# Patient Record
Sex: Female | Born: 1970
Health system: Southern US, Community
[De-identification: ages and names within clinical notes are randomized; demographics above are authoritative.]

## PROBLEM LIST (undated history)

## (undated) DIAGNOSIS — I5189 Other ill-defined heart diseases: Secondary | ICD-10-CM

## (undated) DIAGNOSIS — K219 Gastro-esophageal reflux disease without esophagitis: Secondary | ICD-10-CM

## (undated) DIAGNOSIS — E063 Autoimmune thyroiditis: Secondary | ICD-10-CM

## (undated) DIAGNOSIS — Z992 Dependence on renal dialysis: Secondary | ICD-10-CM

## (undated) DIAGNOSIS — Z91199 Patient's noncompliance with other medical treatment and regimen due to unspecified reason: Secondary | ICD-10-CM

## (undated) DIAGNOSIS — L089 Local infection of the skin and subcutaneous tissue, unspecified: Secondary | ICD-10-CM

## (undated) DIAGNOSIS — I2510 Atherosclerotic heart disease of native coronary artery without angina pectoris: Secondary | ICD-10-CM

## (undated) DIAGNOSIS — D649 Anemia, unspecified: Secondary | ICD-10-CM

## (undated) DIAGNOSIS — I96 Gangrene, not elsewhere classified: Secondary | ICD-10-CM

## (undated) DIAGNOSIS — S31109A Unspecified open wound of abdominal wall, unspecified quadrant without penetration into peritoneal cavity, initial encounter: Secondary | ICD-10-CM

## (undated) DIAGNOSIS — I739 Peripheral vascular disease, unspecified: Secondary | ICD-10-CM

## (undated) DIAGNOSIS — Z9119 Patient's noncompliance with other medical treatment and regimen: Secondary | ICD-10-CM

## (undated) DIAGNOSIS — E039 Hypothyroidism, unspecified: Secondary | ICD-10-CM

## (undated) DIAGNOSIS — I10 Essential (primary) hypertension: Secondary | ICD-10-CM

## (undated) DIAGNOSIS — I4819 Other persistent atrial fibrillation: Secondary | ICD-10-CM

## (undated) DIAGNOSIS — N186 End stage renal disease: Secondary | ICD-10-CM

## (undated) HISTORY — DX: Essential (primary) hypertension: I10

## (undated) HISTORY — DX: Anemia, unspecified: D64.9

## (undated) HISTORY — DX: Autoimmune thyroiditis: E06.3

## (undated) HISTORY — DX: Morbid (severe) obesity due to excess calories: E66.01

## (undated) HISTORY — DX: Atherosclerotic heart disease of native coronary artery without angina pectoris: I25.10

## (undated) HISTORY — DX: Patient's noncompliance with other medical treatment and regimen: Z91.19

## (undated) HISTORY — DX: Gastro-esophageal reflux disease without esophagitis: K21.9

## (undated) HISTORY — DX: Patient's noncompliance with other medical treatment and regimen due to unspecified reason: Z91.199

## (undated) HISTORY — DX: End stage renal disease: N18.6

## (undated) HISTORY — PX: TONSILLECTOMY AND ADENOIDECTOMY: SUR1326

## (undated) HISTORY — PX: THYROIDECTOMY, PARTIAL: SHX18

---

## 1990-11-26 HISTORY — PX: OTHER SURGICAL HISTORY: SHX169

## 1998-03-11 ENCOUNTER — Inpatient Hospital Stay (HOSPITAL_COMMUNITY): Admission: RE | Admit: 1998-03-11 | Discharge: 1998-03-12 | Payer: Self-pay | Admitting: General Surgery

## 1998-03-26 ENCOUNTER — Other Ambulatory Visit: Admission: RE | Admit: 1998-03-26 | Discharge: 1998-03-26 | Payer: Self-pay | Admitting: Nephrology

## 1998-03-30 ENCOUNTER — Other Ambulatory Visit: Admission: RE | Admit: 1998-03-30 | Discharge: 1998-03-30 | Payer: Self-pay | Admitting: Nephrology

## 1998-05-31 ENCOUNTER — Other Ambulatory Visit: Admission: RE | Admit: 1998-05-31 | Discharge: 1998-05-31 | Payer: Self-pay | Admitting: Nephrology

## 1998-06-03 ENCOUNTER — Other Ambulatory Visit: Admission: RE | Admit: 1998-06-03 | Discharge: 1998-06-03 | Payer: Self-pay | Admitting: Nephrology

## 1998-06-10 ENCOUNTER — Other Ambulatory Visit: Admission: RE | Admit: 1998-06-10 | Discharge: 1998-06-10 | Payer: Self-pay | Admitting: Nephrology

## 1998-06-22 ENCOUNTER — Other Ambulatory Visit: Admission: RE | Admit: 1998-06-22 | Discharge: 1998-06-22 | Payer: Self-pay | Admitting: Nephrology

## 1998-07-21 ENCOUNTER — Inpatient Hospital Stay (HOSPITAL_COMMUNITY): Admission: AD | Admit: 1998-07-21 | Discharge: 1998-07-23 | Payer: Self-pay | Admitting: Nephrology

## 1998-07-22 ENCOUNTER — Encounter: Payer: Self-pay | Admitting: Nephrology

## 1998-08-19 ENCOUNTER — Ambulatory Visit (HOSPITAL_COMMUNITY): Admission: RE | Admit: 1998-08-19 | Discharge: 1998-08-19 | Payer: Self-pay | Admitting: General Surgery

## 1998-10-11 ENCOUNTER — Ambulatory Visit (HOSPITAL_COMMUNITY): Admission: RE | Admit: 1998-10-11 | Discharge: 1998-10-12 | Payer: Self-pay | Admitting: Vascular Surgery

## 1999-02-21 ENCOUNTER — Inpatient Hospital Stay (HOSPITAL_COMMUNITY): Admission: RE | Admit: 1999-02-21 | Discharge: 1999-02-27 | Payer: Self-pay

## 1999-02-23 ENCOUNTER — Encounter: Payer: Self-pay | Admitting: Nephrology

## 1999-09-13 ENCOUNTER — Ambulatory Visit (HOSPITAL_COMMUNITY): Admission: RE | Admit: 1999-09-13 | Discharge: 1999-09-13 | Payer: Self-pay | Admitting: Vascular Surgery

## 2000-01-02 ENCOUNTER — Ambulatory Visit (HOSPITAL_COMMUNITY): Admission: RE | Admit: 2000-01-02 | Discharge: 2000-01-02 | Payer: Self-pay

## 2000-01-10 ENCOUNTER — Encounter: Admission: RE | Admit: 2000-01-10 | Discharge: 2000-01-10 | Payer: Self-pay | Admitting: Nephrology

## 2000-01-10 ENCOUNTER — Encounter: Payer: Self-pay | Admitting: Nephrology

## 2000-01-16 ENCOUNTER — Ambulatory Visit (HOSPITAL_COMMUNITY): Admission: RE | Admit: 2000-01-16 | Discharge: 2000-01-16 | Payer: Self-pay | Admitting: *Deleted

## 2000-04-12 ENCOUNTER — Inpatient Hospital Stay (HOSPITAL_COMMUNITY): Admission: RE | Admit: 2000-04-12 | Discharge: 2000-04-12 | Payer: Self-pay

## 2000-06-14 ENCOUNTER — Inpatient Hospital Stay (HOSPITAL_COMMUNITY): Admission: RE | Admit: 2000-06-14 | Discharge: 2000-06-18 | Payer: Self-pay

## 2000-06-23 ENCOUNTER — Inpatient Hospital Stay (HOSPITAL_COMMUNITY): Admission: EM | Admit: 2000-06-23 | Discharge: 2000-06-25 | Payer: Self-pay | Admitting: Emergency Medicine

## 2000-06-24 ENCOUNTER — Encounter: Payer: Self-pay | Admitting: Vascular Surgery

## 2000-07-05 ENCOUNTER — Inpatient Hospital Stay (HOSPITAL_COMMUNITY): Admission: RE | Admit: 2000-07-05 | Discharge: 2000-07-09 | Payer: Self-pay | Admitting: Vascular Surgery

## 2000-07-05 ENCOUNTER — Encounter: Payer: Self-pay | Admitting: Vascular Surgery

## 2002-06-25 ENCOUNTER — Ambulatory Visit (HOSPITAL_COMMUNITY): Admission: RE | Admit: 2002-06-25 | Discharge: 2002-06-25 | Payer: Self-pay

## 2002-11-21 ENCOUNTER — Emergency Department (HOSPITAL_COMMUNITY): Admission: EM | Admit: 2002-11-21 | Discharge: 2002-11-21 | Payer: Self-pay | Admitting: Emergency Medicine

## 2002-11-21 ENCOUNTER — Encounter: Payer: Self-pay | Admitting: Emergency Medicine

## 2003-01-29 ENCOUNTER — Encounter: Payer: Self-pay | Admitting: Urology

## 2003-01-29 ENCOUNTER — Ambulatory Visit (HOSPITAL_COMMUNITY): Admission: RE | Admit: 2003-01-29 | Discharge: 2003-01-29 | Payer: Self-pay | Admitting: Urology

## 2003-05-08 ENCOUNTER — Encounter (INDEPENDENT_AMBULATORY_CARE_PROVIDER_SITE_OTHER): Payer: Self-pay | Admitting: *Deleted

## 2003-05-08 ENCOUNTER — Emergency Department (HOSPITAL_COMMUNITY): Admission: EM | Admit: 2003-05-08 | Discharge: 2003-05-08 | Payer: Self-pay | Admitting: *Deleted

## 2003-05-08 LAB — CONVERTED CEMR LAB
HCT: 36 %
Hemoglobin: 12.2 g/dL
MCV: 97.4 fL
Platelets: 170 10*3/uL
WBC: 16.3 10*3/uL

## 2003-05-09 ENCOUNTER — Encounter (INDEPENDENT_AMBULATORY_CARE_PROVIDER_SITE_OTHER): Payer: Self-pay | Admitting: *Deleted

## 2003-05-09 LAB — CONVERTED CEMR LAB
BUN: 61 mg/dL
CO2: 23 meq/L
Calcium: 8.5 mg/dL
Chloride: 103 meq/L
Creatinine, Ser: 12.1 mg/dL
Glucose, Bld: 113 mg/dL
HCT: 29.1 %
Hemoglobin: 10 g/dL
MCV: 96.6 fL
Platelets: 115 10*3/uL
Potassium: 7.9 meq/L
Sodium: 135 meq/L
WBC: 22.5 10*3/uL

## 2003-05-12 ENCOUNTER — Encounter (INDEPENDENT_AMBULATORY_CARE_PROVIDER_SITE_OTHER): Payer: Self-pay | Admitting: *Deleted

## 2003-05-12 LAB — CONVERTED CEMR LAB
BUN: 55 mg/dL
CO2: 22 meq/L
Calcium: 7.7 mg/dL
Chloride: 99 meq/L
Creatinine, Ser: 10.8 mg/dL
Glucose, Bld: 93 mg/dL
HCT: 23.8 %
Hemoglobin: 8 g/dL
MCV: 92.8 fL
Platelets: 214 10*3/uL
Potassium: 3.8 meq/L
Sodium: 131 meq/L
WBC: 2.4 10*3/uL

## 2003-05-17 ENCOUNTER — Encounter (INDEPENDENT_AMBULATORY_CARE_PROVIDER_SITE_OTHER): Payer: Self-pay | Admitting: *Deleted

## 2003-05-17 LAB — CONVERTED CEMR LAB
BUN: 65 mg/dL
Chloride: 103 meq/L
Glucose, Bld: 81 mg/dL
Potassium: 4.8 meq/L
Sodium: 134 meq/L

## 2003-05-19 ENCOUNTER — Encounter (INDEPENDENT_AMBULATORY_CARE_PROVIDER_SITE_OTHER): Payer: Self-pay | Admitting: *Deleted

## 2003-05-19 LAB — CONVERTED CEMR LAB
HCT: 33.7 %
Hemoglobin: 11.4 g/dL
MCV: 91.7 fL
Platelets: 290 10*3/uL
WBC: 8.6 10*3/uL

## 2003-05-20 ENCOUNTER — Encounter (INDEPENDENT_AMBULATORY_CARE_PROVIDER_SITE_OTHER): Payer: Self-pay | Admitting: *Deleted

## 2003-05-20 LAB — CONVERTED CEMR LAB
HCT: 31.9 %
Hemoglobin: 10.8 g/dL
MCV: 92.5 fL
Platelets: 260 10*3/uL
WBC: 8.3 10*3/uL

## 2003-05-25 ENCOUNTER — Encounter (INDEPENDENT_AMBULATORY_CARE_PROVIDER_SITE_OTHER): Payer: Self-pay | Admitting: *Deleted

## 2003-05-25 LAB — CONVERTED CEMR LAB
HCT: 30.5 %
Hemoglobin: 10.2 g/dL
MCV: 92.4 fL
Platelets: 223 10*3/uL
WBC: 8.1 10*3/uL

## 2004-03-18 DIAGNOSIS — Z888 Allergy status to other drugs, medicaments and biological substances status: Secondary | ICD-10-CM | POA: Insufficient documentation

## 2004-03-18 DIAGNOSIS — Z9119 Patient's noncompliance with other medical treatment and regimen: Secondary | ICD-10-CM | POA: Insufficient documentation

## 2004-04-20 ENCOUNTER — Ambulatory Visit (HOSPITAL_COMMUNITY): Admission: RE | Admit: 2004-04-20 | Discharge: 2004-04-20 | Payer: Self-pay | Admitting: Nephrology

## 2004-04-21 ENCOUNTER — Ambulatory Visit (HOSPITAL_COMMUNITY): Admission: RE | Admit: 2004-04-21 | Discharge: 2004-04-22 | Payer: Self-pay

## 2004-05-26 ENCOUNTER — Ambulatory Visit (HOSPITAL_COMMUNITY): Admission: RE | Admit: 2004-05-26 | Discharge: 2004-05-26 | Payer: Self-pay | Admitting: Vascular Surgery

## 2004-11-24 ENCOUNTER — Encounter: Admission: RE | Admit: 2004-11-24 | Discharge: 2004-11-24 | Payer: Self-pay | Admitting: Surgery

## 2004-12-06 ENCOUNTER — Ambulatory Visit (HOSPITAL_COMMUNITY): Admission: RE | Admit: 2004-12-06 | Discharge: 2004-12-06 | Payer: Self-pay | Admitting: Nephrology

## 2004-12-19 ENCOUNTER — Ambulatory Visit (HOSPITAL_COMMUNITY): Admission: RE | Admit: 2004-12-19 | Discharge: 2004-12-19 | Payer: Self-pay | Admitting: Nephrology

## 2005-01-19 ENCOUNTER — Ambulatory Visit (HOSPITAL_COMMUNITY): Admission: RE | Admit: 2005-01-19 | Discharge: 2005-01-19 | Payer: Self-pay | Admitting: Nephrology

## 2005-04-04 ENCOUNTER — Ambulatory Visit (HOSPITAL_COMMUNITY): Admission: RE | Admit: 2005-04-04 | Discharge: 2005-04-04 | Payer: Self-pay | Admitting: Gastroenterology

## 2005-05-11 ENCOUNTER — Ambulatory Visit: Payer: Self-pay

## 2005-06-07 ENCOUNTER — Ambulatory Visit (HOSPITAL_COMMUNITY): Admission: RE | Admit: 2005-06-07 | Discharge: 2005-06-07 | Payer: Self-pay | Admitting: Gastroenterology

## 2005-09-07 ENCOUNTER — Ambulatory Visit (HOSPITAL_COMMUNITY): Admission: RE | Admit: 2005-09-07 | Discharge: 2005-09-07 | Payer: Self-pay | Admitting: Nephrology

## 2005-10-25 ENCOUNTER — Ambulatory Visit (HOSPITAL_COMMUNITY): Admission: RE | Admit: 2005-10-25 | Discharge: 2005-10-25 | Payer: Self-pay | Admitting: Nephrology

## 2005-11-08 ENCOUNTER — Ambulatory Visit (HOSPITAL_COMMUNITY): Admission: RE | Admit: 2005-11-08 | Discharge: 2005-11-08 | Payer: Self-pay | Admitting: Nephrology

## 2006-03-27 ENCOUNTER — Ambulatory Visit (HOSPITAL_COMMUNITY): Admission: RE | Admit: 2006-03-27 | Discharge: 2006-03-27 | Payer: Self-pay | Admitting: Nephrology

## 2006-08-07 ENCOUNTER — Emergency Department (HOSPITAL_COMMUNITY): Admission: EM | Admit: 2006-08-07 | Discharge: 2006-08-08 | Payer: Self-pay | Admitting: Emergency Medicine

## 2006-09-27 ENCOUNTER — Inpatient Hospital Stay (HOSPITAL_COMMUNITY): Admission: RE | Admit: 2006-09-27 | Discharge: 2006-10-02 | Payer: Self-pay | Admitting: Surgery

## 2006-09-27 ENCOUNTER — Encounter (INDEPENDENT_AMBULATORY_CARE_PROVIDER_SITE_OTHER): Payer: Self-pay | Admitting: *Deleted

## 2007-01-01 ENCOUNTER — Ambulatory Visit (HOSPITAL_COMMUNITY): Admission: RE | Admit: 2007-01-01 | Discharge: 2007-01-01 | Payer: Self-pay | Admitting: Nephrology

## 2007-01-22 ENCOUNTER — Ambulatory Visit: Payer: Self-pay | Admitting: Orthopedic Surgery

## 2007-06-06 ENCOUNTER — Ambulatory Visit (HOSPITAL_COMMUNITY): Admission: RE | Admit: 2007-06-06 | Discharge: 2007-06-06 | Payer: Self-pay | Admitting: Nephrology

## 2007-06-07 ENCOUNTER — Ambulatory Visit (HOSPITAL_COMMUNITY): Admission: RE | Admit: 2007-06-07 | Discharge: 2007-06-07 | Payer: Self-pay | Admitting: Nephrology

## 2007-07-03 ENCOUNTER — Emergency Department (HOSPITAL_COMMUNITY): Admission: EM | Admit: 2007-07-03 | Discharge: 2007-07-03 | Payer: Self-pay | Admitting: Emergency Medicine

## 2007-08-02 ENCOUNTER — Ambulatory Visit (HOSPITAL_COMMUNITY): Admission: RE | Admit: 2007-08-02 | Discharge: 2007-08-02 | Payer: Self-pay | Admitting: Interventional Radiology

## 2007-09-12 ENCOUNTER — Ambulatory Visit (HOSPITAL_COMMUNITY): Admission: RE | Admit: 2007-09-12 | Discharge: 2007-09-12 | Payer: Self-pay | Admitting: Nephrology

## 2007-10-26 ENCOUNTER — Ambulatory Visit (HOSPITAL_COMMUNITY): Admission: RE | Admit: 2007-10-26 | Discharge: 2007-10-26 | Payer: Self-pay | Admitting: Nephrology

## 2007-11-29 ENCOUNTER — Ambulatory Visit (HOSPITAL_COMMUNITY): Admission: RE | Admit: 2007-11-29 | Discharge: 2007-11-29 | Payer: Self-pay | Admitting: Nephrology

## 2008-02-24 ENCOUNTER — Ambulatory Visit (HOSPITAL_COMMUNITY): Admission: RE | Admit: 2008-02-24 | Discharge: 2008-02-24 | Payer: Self-pay | Admitting: Nephrology

## 2008-02-25 ENCOUNTER — Ambulatory Visit (HOSPITAL_COMMUNITY): Admission: RE | Admit: 2008-02-25 | Discharge: 2008-02-25 | Payer: Self-pay | Admitting: Nephrology

## 2008-07-21 ENCOUNTER — Ambulatory Visit (HOSPITAL_COMMUNITY): Admission: RE | Admit: 2008-07-21 | Discharge: 2008-07-21 | Payer: Self-pay | Admitting: Nephrology

## 2008-07-27 ENCOUNTER — Ambulatory Visit: Payer: Self-pay | Admitting: Surgery

## 2008-07-27 ENCOUNTER — Inpatient Hospital Stay (HOSPITAL_COMMUNITY): Admission: RE | Admit: 2008-07-27 | Discharge: 2008-07-29 | Payer: Self-pay | Admitting: Surgery

## 2008-08-09 ENCOUNTER — Ambulatory Visit: Payer: Self-pay | Admitting: Surgery

## 2008-08-12 ENCOUNTER — Ambulatory Visit: Payer: Self-pay | Admitting: Surgery

## 2008-10-01 ENCOUNTER — Ambulatory Visit (HOSPITAL_COMMUNITY): Admission: RE | Admit: 2008-10-01 | Discharge: 2008-10-01 | Payer: Self-pay | Admitting: Nephrology

## 2008-10-06 DIAGNOSIS — N033 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: Secondary | ICD-10-CM | POA: Insufficient documentation

## 2008-12-01 ENCOUNTER — Ambulatory Visit (HOSPITAL_COMMUNITY): Admission: RE | Admit: 2008-12-01 | Discharge: 2008-12-01 | Payer: Self-pay | Admitting: Nephrology

## 2008-12-03 ENCOUNTER — Ambulatory Visit (HOSPITAL_COMMUNITY): Admission: RE | Admit: 2008-12-03 | Discharge: 2008-12-03 | Payer: Self-pay | Admitting: Nephrology

## 2009-02-08 ENCOUNTER — Encounter: Admission: RE | Admit: 2009-02-08 | Discharge: 2009-02-08 | Payer: Self-pay | Admitting: Orthopedic Surgery

## 2009-02-22 ENCOUNTER — Encounter (HOSPITAL_COMMUNITY): Admission: RE | Admit: 2009-02-22 | Discharge: 2009-03-23 | Payer: Self-pay | Admitting: Orthopedic Surgery

## 2009-03-28 ENCOUNTER — Encounter (HOSPITAL_COMMUNITY): Admission: RE | Admit: 2009-03-28 | Discharge: 2009-04-27 | Payer: Self-pay | Admitting: Orthopedic Surgery

## 2009-04-24 ENCOUNTER — Emergency Department (HOSPITAL_COMMUNITY): Admission: EM | Admit: 2009-04-24 | Discharge: 2009-04-24 | Payer: Self-pay | Admitting: Emergency Medicine

## 2009-09-23 ENCOUNTER — Ambulatory Visit (HOSPITAL_COMMUNITY): Admission: RE | Admit: 2009-09-23 | Discharge: 2009-09-23 | Payer: Self-pay | Admitting: Nephrology

## 2009-09-26 DIAGNOSIS — N2581 Secondary hyperparathyroidism of renal origin: Secondary | ICD-10-CM | POA: Insufficient documentation

## 2010-03-09 ENCOUNTER — Ambulatory Visit (HOSPITAL_COMMUNITY): Admission: RE | Admit: 2010-03-09 | Discharge: 2010-03-09 | Payer: Self-pay | Admitting: Nephrology

## 2010-04-28 ENCOUNTER — Ambulatory Visit (HOSPITAL_COMMUNITY): Admission: RE | Admit: 2010-04-28 | Discharge: 2010-04-28 | Payer: Self-pay | Admitting: Nephrology

## 2010-04-29 ENCOUNTER — Ambulatory Visit (HOSPITAL_COMMUNITY): Admission: RE | Admit: 2010-04-29 | Discharge: 2010-04-29 | Payer: Self-pay | Admitting: Nephrology

## 2010-04-29 ENCOUNTER — Ambulatory Visit: Payer: Self-pay | Admitting: Vascular Surgery

## 2010-04-29 ENCOUNTER — Inpatient Hospital Stay (HOSPITAL_COMMUNITY): Admission: AD | Admit: 2010-04-29 | Discharge: 2010-04-30 | Payer: Self-pay | Admitting: Nephrology

## 2010-04-30 ENCOUNTER — Encounter: Payer: Self-pay | Admitting: Cardiology

## 2010-04-30 LAB — CONVERTED CEMR LAB
BUN: 74 mg/dL
Calcium: 7.4 mg/dL
Creatinine, Ser: 15 mg/dL
Hemoglobin: 11.4 g/dL
Potassium: 4.8 meq/L
Sodium: 138 meq/L
WBC: 10 10*3/uL

## 2010-05-12 ENCOUNTER — Ambulatory Visit: Payer: Self-pay

## 2010-05-19 ENCOUNTER — Ambulatory Visit (HOSPITAL_COMMUNITY): Admission: RE | Admit: 2010-05-19 | Discharge: 2010-05-19 | Payer: Self-pay | Admitting: Vascular Surgery

## 2010-05-21 ENCOUNTER — Emergency Department (HOSPITAL_COMMUNITY): Admission: EM | Admit: 2010-05-21 | Discharge: 2010-05-22 | Payer: Self-pay | Admitting: Emergency Medicine

## 2010-05-24 ENCOUNTER — Ambulatory Visit: Payer: Self-pay | Admitting: Vascular Surgery

## 2010-06-07 ENCOUNTER — Encounter (INDEPENDENT_AMBULATORY_CARE_PROVIDER_SITE_OTHER): Payer: Self-pay | Admitting: *Deleted

## 2010-06-07 ENCOUNTER — Ambulatory Visit: Payer: Self-pay | Admitting: Vascular Surgery

## 2010-06-07 ENCOUNTER — Ambulatory Visit (HOSPITAL_COMMUNITY): Admission: RE | Admit: 2010-06-07 | Discharge: 2010-06-07 | Payer: Self-pay | Admitting: Vascular Surgery

## 2010-06-07 LAB — CONVERTED CEMR LAB
Glucose, Bld: 78 mg/dL
HCT: 42 %
Hemoglobin: 14.3 g/dL
Potassium: 4.2 meq/L
Sodium: 139 meq/L

## 2010-06-15 ENCOUNTER — Ambulatory Visit (HOSPITAL_COMMUNITY): Admission: RE | Admit: 2010-06-15 | Discharge: 2010-06-15 | Payer: Self-pay | Admitting: Nephrology

## 2010-06-30 ENCOUNTER — Ambulatory Visit: Payer: Self-pay | Admitting: Cardiology

## 2010-06-30 ENCOUNTER — Encounter (INDEPENDENT_AMBULATORY_CARE_PROVIDER_SITE_OTHER): Payer: Self-pay | Admitting: Nephrology

## 2010-06-30 ENCOUNTER — Ambulatory Visit (HOSPITAL_COMMUNITY): Admission: RE | Admit: 2010-06-30 | Discharge: 2010-06-30 | Payer: Self-pay | Admitting: Nephrology

## 2010-07-13 ENCOUNTER — Encounter (INDEPENDENT_AMBULATORY_CARE_PROVIDER_SITE_OTHER): Payer: Self-pay | Admitting: *Deleted

## 2010-07-14 ENCOUNTER — Ambulatory Visit: Payer: Self-pay | Admitting: Cardiology

## 2010-07-14 ENCOUNTER — Encounter (INDEPENDENT_AMBULATORY_CARE_PROVIDER_SITE_OTHER): Payer: Self-pay | Admitting: *Deleted

## 2010-07-14 DIAGNOSIS — E669 Obesity, unspecified: Secondary | ICD-10-CM | POA: Insufficient documentation

## 2010-07-14 DIAGNOSIS — N186 End stage renal disease: Secondary | ICD-10-CM | POA: Insufficient documentation

## 2010-07-14 DIAGNOSIS — K219 Gastro-esophageal reflux disease without esophagitis: Secondary | ICD-10-CM | POA: Insufficient documentation

## 2010-07-14 DIAGNOSIS — Z862 Personal history of diseases of the blood and blood-forming organs and certain disorders involving the immune mechanism: Secondary | ICD-10-CM | POA: Insufficient documentation

## 2010-07-14 DIAGNOSIS — Z8639 Personal history of other endocrine, nutritional and metabolic disease: Secondary | ICD-10-CM | POA: Insufficient documentation

## 2010-07-14 DIAGNOSIS — R002 Palpitations: Secondary | ICD-10-CM | POA: Insufficient documentation

## 2010-07-14 DIAGNOSIS — D638 Anemia in other chronic diseases classified elsewhere: Secondary | ICD-10-CM | POA: Insufficient documentation

## 2010-07-14 LAB — CONVERTED CEMR LAB
Free T4: 1.15 ng/dL
Free T4: 1.15 ng/dL (ref 0.80–1.80)
T3, Free: 2 pg/mL
T3, Free: 2 pg/mL — ABNORMAL LOW (ref 2.3–4.2)

## 2010-07-16 DIAGNOSIS — R55 Syncope and collapse: Secondary | ICD-10-CM | POA: Insufficient documentation

## 2010-07-16 DIAGNOSIS — R42 Dizziness and giddiness: Secondary | ICD-10-CM | POA: Insufficient documentation

## 2010-08-01 ENCOUNTER — Inpatient Hospital Stay (HOSPITAL_COMMUNITY): Admission: AD | Admit: 2010-08-01 | Discharge: 2010-08-03 | Payer: Self-pay | Admitting: Vascular Surgery

## 2010-08-02 ENCOUNTER — Ambulatory Visit: Payer: Self-pay | Admitting: Vascular Surgery

## 2010-08-04 ENCOUNTER — Telehealth (INDEPENDENT_AMBULATORY_CARE_PROVIDER_SITE_OTHER): Payer: Self-pay

## 2010-08-10 ENCOUNTER — Ambulatory Visit: Payer: Self-pay | Admitting: Cardiology

## 2010-08-15 ENCOUNTER — Ambulatory Visit: Payer: Medicare Other | Admitting: Vascular Surgery

## 2010-08-16 ENCOUNTER — Encounter (INDEPENDENT_AMBULATORY_CARE_PROVIDER_SITE_OTHER): Payer: Self-pay | Admitting: *Deleted

## 2010-08-21 ENCOUNTER — Ambulatory Visit: Payer: Self-pay | Admitting: Cardiology

## 2010-08-23 ENCOUNTER — Ambulatory Visit: Payer: Medicare Other | Admitting: Vascular Surgery

## 2010-09-07 ENCOUNTER — Encounter: Payer: Self-pay | Admitting: Cardiology

## 2010-09-22 DIAGNOSIS — I2510 Atherosclerotic heart disease of native coronary artery without angina pectoris: Secondary | ICD-10-CM | POA: Insufficient documentation

## 2010-09-26 ENCOUNTER — Encounter: Payer: Self-pay | Admitting: Cardiology

## 2010-10-12 ENCOUNTER — Ambulatory Visit: Payer: Medicare Other | Admitting: Vascular Surgery

## 2010-10-16 ENCOUNTER — Ambulatory Visit: Payer: Self-pay | Admitting: Cardiology

## 2010-11-02 ENCOUNTER — Ambulatory Visit: Payer: Medicare Other | Admitting: Vascular Surgery

## 2010-12-16 ENCOUNTER — Encounter: Payer: Self-pay | Admitting: Surgery

## 2010-12-16 ENCOUNTER — Encounter: Payer: Self-pay | Admitting: Nephrology

## 2010-12-17 ENCOUNTER — Encounter: Payer: Self-pay | Admitting: Orthopedic Surgery

## 2010-12-17 ENCOUNTER — Encounter: Payer: Self-pay | Admitting: Nephrology

## 2010-12-17 ENCOUNTER — Encounter: Payer: Self-pay | Admitting: Surgery

## 2010-12-22 ENCOUNTER — Ambulatory Visit: Payer: Medicare Other | Admitting: Vascular Surgery

## 2010-12-28 NOTE — Letter (Signed)
Summary: [facility]  [facility] RECORDS   Imported By: Nevada Crane 09/26/2010 15:29:50  _____________________________________________________________________  External Attachment:    Type:   Image     Comment:   External Document

## 2010-12-28 NOTE — Miscellaneous (Signed)
Summary: labs t4,t3,07/14/2010  Clinical Lists Changes  Observations: Added new observation of T4, FREE: 1.15 ng/dL (07/14/2010 9:26) Added new observation of T3 FREE: 2.0 pg/mL (07/14/2010 9:26)

## 2010-12-28 NOTE — Miscellaneous (Signed)
Summary: HOSPITAL LABS 06/07/2010  Clinical Lists Changes  Observations: Added new observation of BG RANDOM: 78 mg/dL (06/07/2010 16:45) Added new observation of K SERUM: 4.2 meq/L (06/07/2010 16:45) Added new observation of NA: 139 meq/L (06/07/2010 16:45) Added new observation of HCT: 42.0 % (06/07/2010 16:45) Added new observation of HGB: 14.3 g/dL (06/07/2010 16:45)

## 2010-12-28 NOTE — Progress Notes (Signed)
Summary: [facility]  Phone Note Other Incoming   Caller: Dorothy from [employer] Details for Reason: Cant reach pt.  Summary of Call: Earlie Server, customer service rep. for [employer], called to inform us that they have not been able to contact pt. concerning event monitor hook up. Earlie Server states she is calling pt's home phone @ [phone] and that number has been disconnected.  She states that if pt. is not contacted within 1 week they will have to cancel monitor order. We will try to reach pt.  Initial call taken by: Jeani Hawking Via LPN,  September  9, [id number] 1:59 PM  Follow-up for Phone Call        Pt's emergency contact, Mr. Royce Macadamia, states that he received a call from [facility] this morning concerning a procedure that pt. was suppose to have done this morning but did not keep appt. He states he went to her house to give her the message but she was not home. Mr. Royce Macadamia states he will ask Mrs. Lotito to call this office ASAP the next time he sees or talks with her.  Follow-up by: Jeani Hawking Via LPN,  September  9, 624THL 2:02 PM  Additional Follow-up for Phone Call Additional follow up Details #1::        Pt. states she has been in [place]. since Tuesday and that her home phone is broken. Pt. given phone number of [phone] opt. 1 to talk with customer service at [facility]. She repeated number back to me and states she will call them today. Additional Follow-up by: Jeani Hawking Via LPN,  September  9, [id number] 2:42 PM

## 2010-12-28 NOTE — Assessment & Plan Note (Signed)
Summary: 2 mth f/u per checkout on 08/21/10/tg   Visit Type:  Follow-up Primary Provider:  [facility]   History of Present Illness: Lori [name] is a 40 y/o obese CF we are following for assessment and treatment of AoV stenosis and CAD.  She is a dialysis patient with history near syncope. A cardionet monitor was placed on the last visit to evaluate for cardiac etiology for this.  She is without complaints and has had no futher complaints of near syncope.  Current Medications (verified): 1)  Albuterol 90 Mcg/act Aers (Albuterol) .... 2 Inh Every 4 Hours As Needed 2)  Omeprazole 20 Mg Cpdr (Omeprazole) .... Take 1 Tab Two Times A Day 3)  Tramadol-Acetaminophen 37.5-325 Mg Tabs (Tramadol-Acetaminophen) .... Take As Needed For Pain 4)  Synthroid 300 Mcg Tabs (Levothyroxine Sodium) .... Take 1 Tab At Bedtime 5)  Sulindac 200 Mg Tabs (Sulindac) .... Take 1 Tab Two Times A Day 6)  Phoslo 667 Mg Caps (Calcium Acetate (Phos Binder)) .... Take 3 Tabs With Meals Eats 2 Times Daily 1 Tab With Snack 7)  Rena-Vite  Tabs (B Complex-C-Folic Acid) .... Take 1 Tab Daily  Allergies (verified): 1)  ! Coumadin  Comments:  Nurse/Medical Assistant: patient reviewed med list from previous ov and stated that meds are correct  Past History:  Past medical, surgical, family and social histories (including risk factors) reviewed, and no changes noted (except as noted below).  Past Medical History: Reviewed history from 09/22/2010 and no changes required. 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 Resection left lobe of thyroid, subtotal parathyroidectomy with reimplantation in the forearm; small focus       of papillary carcinoma incidentally noted at pathology-2000 and Hashimoto's thyroiditis in 2001 Resection of right lobe of thyroid, neck exploration, no parathyroid tissue identified-2001 Multiple vascular access surgeries BTL-1992 Tonsillectomy and adenoidectomy  Family History: Reviewed history from 09/22/2010 and no changes required. Father-end-stage renal disease; deceased Mother-coronary artery disease; deceased in 34s Siblings: Sister with diabetes  Social History: Reviewed history from 07/13/2010 and no changes required. Married  Tobacco Use - Yes.  Alcohol Use - no Regular Exercise - no Drug Use - no  Review of Systems       All other systems have been reviewed and are negative unless stated above.   Vital Signs:  Patient profile:   40 year old female Weight:      237 pounds O2 Sat:      98 % on Room air Pulse rate:   70 / minute BP sitting:   149 / 106  (right arm)  Vitals Entered By: Doretha Sou, CNA (October 16, 2010 2:56 PM)  O2 Flow:  Room air  Physical Exam  General:  Well developed, well nourished, in no acute distress. Neck:  Scar from parathyroidectomy noted. Chest Wall:  Temporary dialysis cathether to Right chest. Lungs:  Essentially CTA Heart:  Distant, RRR with 2/6 systolic murmur at the LSB no radiation at present into the carotids. Abdomen:  Obese, NT 2+ bowel sounds. Msk:  Multiple scars on arms from previous dialysis catheters. Pulses:  pulses normal in all 4 extremities Extremities:  Right upper arm bandage from thrombectomy to remove occlussion of brachial vein to allow for dialysis fistula to be placed. Neurologic:  Alert and  oriented x 3. Psych:  Normal affect.   Impression & Recommendations:  Problem # 1:  ATHEROSCLEROTIC CARDIOVASCULAR DISEASE (ICD-429.2) She is asymptomatic from CV standpoint.  Cardionet monitor was negative for significant arrythmias.  Read by Dr. Lattie Haw, " NSR, ST, few PVS'sm and PAC's; 3 episodes of chest pain.  No arrythmia.  Problem # 2:  AORTIC STENOSIS-MILD (ICD-424.1) Will repeat echo in 2-3 years unless she becomes symptomatic re. chest pain, dizziness, of DOE.  Problem # 3:  HYPERTENSION (ICD-401.1) Elevated today, but she is due for dialysis tomorrow am.  No medication changes at present.   Will leave to nephrology for adjustments as necessary.  Patient Instructions: 1)  Your physician recommends that you schedule a follow-up appointment in: 12 months 2)  Your physician recommends that you continue on your current medications as directed. Please refer to the Current Medication list given to you today.

## 2010-12-28 NOTE — Assessment & Plan Note (Signed)
Summary: ***per Dr.Colodonado to eval EF and 2 D Echo/tg   Visit Type:  Follow-up Primary Provider:  [facility]   History of Present Illness: Lori [name] is seen in the office today for an initial visit at the kind request of Dr Burman Foster.  She has had long-standing end-stage renal disease, having required dialysis for the past 15 years or so.  She has no known coronary disease, but is at high risk.  She has done remarkably well over the years except for problems related to thyroid and parathyroid function that have required multiple surgeries and problems with vascular access.  She denies chest discomfort, but has class II dyspnea on exertion.  Her principle problem of late has been orthostatic dizziness with one episode of falling that apparently was caused by a loss of consciousness.  There is nothing to suggest a seizure disorder or other neurologic problem.  ------------------------------------ Echocardiogram 06/30/2010   Left ventricle: The cavity size was normal. Wall thickness was     increased increased in a pattern of mild to moderate LVH. Systolic     function was normal. The estimated ejection fraction was in the     range of 55% to 60%. Wall motion was normal; there were no     regional wall motion abnormalities.   - Aortic valve: Moderately calcified annulus. Trileaflet; mildly     thickened, mildly calcified leaflets. There was mild stenosis.      - Aorta: The aorta was moderately calcified.   - Mitral valve: Moderately calcified annulus. Mild regurgitation.   - Pericardium: small effusion identified with a fibrinous appearance and no hemodynamic compromise.  Current Medications (verified): 1)  Albuterol 90 Mcg/act Aers (Albuterol) .... 2 Inh Every 4 Hours As Needed 2)  Omeprazole 20 Mg Cpdr (Omeprazole) .... Take 1 Tab Two Times A Day 3)  Tramadol-Acetaminophen 37.5-325 Mg Tabs (Tramadol-Acetaminophen) .... Take As Needed For Pain 4)  Synthroid 300 Mcg Tabs  (Levothyroxine Sodium) .... Take 1 Tab At Bedtime  Allergies (verified): 1)  ! Coumadin  Past History:  Family History: Last updated: 07/14/2010 Father-end-stage renal disease; deceased Mother-coronary artery disease; deceased Siblings: Sister with diabetes  Social History: Last updated: 07/13/2010 Married  Tobacco Use - Yes.  Alcohol Use - no Regular Exercise - no Drug Use - no  Past Medical History: ESRD-hemodialysis since approximately 1995 Anemia Hyperparathyroidism Hashimoto's thyroiditis: S/P resection of left and right lobes of thyroid 2000 and '01; focus of papillary carcinoma  Gastroesophageal reflux disease: h/o esophageal stricture-previous dilatation Morbid obesity Medical noncompliance  Past Surgical History: Resection left lobe of thyroid, subtotal parathyroidectomy with reimplantation in the forearm; small focus       of papillary carcinoma incidentally noted at pathology-2000 and Hashimoto's thyroiditis in 2001 Resection of right lobe of thyroid, neck exploration, no parathyroid tissue identified-2001 Multiple vascular access surgeries BTL-[id number] Tonsillectomy and adenoidectomy  Review of Systems       The patient complains of syncope, dyspnea on exertion, and abdominal pain.  The patient denies weight loss, weight gain, vision loss, decreased hearing, hoarseness, chest pain, peripheral edema, prolonged cough, headaches, hemoptysis, and melena.    Vital Signs:  Patient profile:   40 year old female Height:      64 inches Weight:      232 pounds BMI:     39.97 Pulse rate:   90 / minute BP sitting:   101 / 63  (right arm)  Vitals Entered By: Doretha Sou, CNA (July 14, 2010  1:42 PM)   Physical Exam  General:  Obese; well-developed; no acute distress: HEENT-[place]/AT; PERRL; EOM intact; conjunctiva and lids nl:  Neck-No JVD; no carotid bruits: Endocrine-No thyromegaly; transverse surgical scar at the base of the neck Lungs-No tachypnea, clear  without rales, rhonchi or wheezes; right chest dialysis catheter CV-normal PMI; distant S1 and S2; grade 2/6 systolic ejection murmur Abdomen-BS normal; soft and non-tender without masses or organomegaly: MS-No deformities, cyanosis or clubbing: Neurologic-Nl cranial nerves; symmetric strength and tone: Skin- Warm, no sig. lesions; bronze discoloration of skin Extremities-decreased to absentl distal pulses; 1/2+ edema    Impression & Recommendations:  Problem # 1:  HYPERTENSION (ICD-401.1) Assessment of blood pressure is questionable due to vascular damage in all 4 extremities.  Pressure readings were obtained from the right leg, but the accuracy of these measurements is unknown, and their usefulness for orthostatic vital signs questionable.  We may ultimately need to establish an arterial line to accurately measure arterial pressure.  Problem # 2:  SYNCOPE (ICD-780.2) By history, patient describes symptoms of orthostatic hypotension with loss of consciousness.  As the result of multiple vascular surgeries, reliable measurements of blood pressure cannot be obtained with a sphygmomanometer.  Alternatives will be investigated; an arterial line may be necessary to obtain at least one set of valid measurements.  For now, I will discuss with the patient's nephrologist the feasibility of allowing her volume status to increase.  Echocardiogram shows mild aortic stenosis and left ventricular hypertrophy with normal LV systolic function.  Neither of these findings should predispose her to loss of consciousness or to orthostatic hypotension.  Basic laboratory studies including thyroid function will be obtained.  Problem # 3:  PALPITATIONS (ICD-785.1) The presence of palpitations in this woman with syncope raises the question of an arrhythmia.  While her history is not entirely consistent with this, we will proceed with event recording to exclude this possibility.  I will plan to reassess this nice woman  in one month.  Other Orders: Cardionet/Event Monitor (Cardionet/Event) T-T3, Free [phone]) T-T4, Free [phone])   Patient Instructions: 1)  Your physician recommends that you schedule a follow-up appointment in: 1 month 2)  Your physician recommends that you return for lab work in: today 3)  Your physician has recommended that you wear an event monitor.  Event monitors are medical devices that record the heart's electrical activity. Doctors most often use these monitors to diagnose arrhythmias. Arrhythmias are problems with the speed or rhythm of the heartbeat. The monitor is a small, portable device. You can wear one while you do your normal daily activities. This is usually used to diagnose what is causing palpitations/syncope (passing out). 4)  records from [facility]

## 2010-12-28 NOTE — Assessment & Plan Note (Signed)
Summary: 1 mth f/u per checkout on 07/14/10/tg   Visit Type:  Follow-up Primary [name]:  Lori [name]   History of Present Illness: [name] returns to the office is planned for continuing assessment and treatment of syncope and possible coronary disease.  Since her last visit currently she's been asymptomatic.  She notes no palpitations, no lightheadedness, no falls and no loss of consciousness.  She has had known orthopnea nor PND.  She has not been troubled with chest discomfort or pedal edema.  Her dry weight has not changed significantly.  Past medical records were not obtained from [facility] as planned at her last office visit.  A new request will be submitted.  Current Medications (verified): 1)  Albuterol 90 Mcg/act Aers (Albuterol) .... 2 Inh Every 4 Hours As Needed 2)  Omeprazole 20 Mg Cpdr (Omeprazole) .... Take 1 Tab Two Times A Day 3)  Tramadol-Acetaminophen 37.5-325 Mg Tabs (Tramadol-Acetaminophen) .... Take As Needed For Pain 4)  Synthroid 300 Mcg Tabs (Levothyroxine Sodium) .... Take 1 Tab At Bedtime 5)  Sulindac 200 Mg Tabs (Sulindac) .... Take 1 Tab Two Times A Day 6)  Phoslo 667 Mg Caps (Calcium Acetate (Phos Binder)) .... Take 3 Tabs With Meals Eats 2 Times Daily 1 Tab With Snack 7)  Rena-Vite  Tabs (B Complex-C-Folic Acid) .... Take 1 Tab Daily  Allergies (verified): 1)  ! Coumadin  Past History:  PMH, FH, and Social History reviewed and updated.  Review of Systems       See history of present illness.  Vital Signs:  Patient profile:   40 year old female Weight:      235 pounds BMI:     40.48 Pulse rate:   70 / minute Pulse (ortho):   78 / minute BP sitting:   125 / 79  (right arm) BP standing:   112 / 83  (right arm)  Vitals Entered By: Doretha Sou, CNA (August 21, 2010 2:57 PM)  Physical Exam  General:  Obese; well-developed; no acute distress: HEENT-[place]/AT; PERRL; EOM intact; conjunctiva and lids nl:  Neck-No JVD; no carotid  bruits: Endocrine-No thyromegaly; transverse surgical scar at the base of the neck Lungs-No tachypnea, clear without rales, rhonchi or wheezes; right chest dialysis catheter CV-normal PMI; distant S1 and S2; grade 2/6 systolic ejection murmur Abdomen-BS normal; soft and non-tender without masses or organomegaly: MS-No deformities, cyanosis or clubbing: Neurologic-Nl cranial nerves; symmetric strength and tone: Skin- Warm, no sig. lesions; bronze discoloration of skin Extremities-surgical scar in both antecubital fossa; decreased to absentl distal pulses; 1/2+ edema.  Pulses further evaluated with a hand-held Doppler.  She had a fairly good signal from the right dorsalis pedis and left radial.  The right radial, right brachial and left posterior tibial were all marginal with markedly deformed contours.  The left dorsalis pedis was absent.    Impression & Recommendations:  Problem # 1:  AORTIC STENOSIS-MILD (ICD-424.1) Mild and asymptomatic.  Problem # 2:  POSTURAL LIGHTHEADEDNESS (ICD-780.4) Standing blood pressure is normal as measured in the left arm.  It does not appear that invasive hemodynamic measurements will be necessary.  Problem # 3:  SYNCOPE (ICD-780.2) There have been no episodes of syncope during event recording nor have significant arrhythmias been identified..  We have another 9 days to possibly record EKG during symptoms before the monitor will be returned to the vendor.  Problem # 4:  HYPERTENSION (ICD-401.1) Assessment: Deteriorated Blood pressure control is excellent; current medications will be continued.  I will reassess this nice woman in 2 months.  Patient Instructions: 1)  Your physician recommends that you schedule a follow-up appointment in: 2 MONTHS 2)  CALL OUR OFFICE FOR SYNCOPE- HANDOUTS FOR NEAR SYNCOPE  Appended Document: 1 mth f/u per checkout on 07/14/10/tg    Clinical Lists Changes  Problems: Added new problem of ATHEROSCLEROTIC CARDIOVASCULAR  DISEASE (ICD-429.2) - BMS to the RCA for single-vessel disease-2004 Observations: Added new observation of FAMILY HX: Father-end-stage renal disease; deceased Mother-coronary artery disease; deceased in 13s Siblings: Sister with diabetes (09/22/2010 22:20) Added new observation of PAST MED HX: ASCVD: Unstable angina in 04/2003 requiring BMS for a 75% RCA stenosis; normal EF; 25% LAD and M1;       hematoma at cath site surgically evacuated Hypertension 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 (09/22/2010 22:20) Added new observation of PRIMARY MD: [place] [name] (09/22/2010 22:20)       Primary [name]:  Lori [name]   History of Present Illness: [facility] medical records reviewed.   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

## 2010-12-28 NOTE — Procedures (Signed)
Summary: [facility]  [employer]   Imported By: Nevada Crane 09/07/2010 14:01:18  _____________________________________________________________________  External Attachment:    Type:   Image     Comment:   External Document

## 2010-12-28 NOTE — Miscellaneous (Signed)
  Clinical Lists Changes  Observations: Added new observation of PLATELETK/UL: 223 K/uL (05/25/2003 12:39) Added new observation of MCV: 92.4 fL (05/25/2003 12:39) Added new observation of HCT: 30.5 % (05/25/2003 12:39) Added new observation of HGB: 10.2 g/dL (05/25/2003 12:39) Added new observation of WBC COUNT: 8.1 10*3/microliter (05/25/2003 12:39) Added new observation of PLATELETK/UL: 260 K/uL (05/20/2003 12:39) Added new observation of MCV: 92.5 fL (05/20/2003 12:39) Added new observation of HCT: 31.9 % (05/20/2003 12:39) Added new observation of HGB: 10.8 g/dL (05/20/2003 12:39) Added new observation of WBC COUNT: 8.3 10*3/microliter (05/20/2003 12:39) Added new observation of PLATELETK/UL: 290 K/uL (05/19/2003 12:39) Added new observation of MCV: 91.7 fL (05/19/2003 12:39) Added new observation of HCT: 33.7 % (05/19/2003 12:39) Added new observation of HGB: 11.4 g/dL (05/19/2003 12:39) Added new observation of WBC COUNT: 8.6 10*3/microliter (05/19/2003 12:39) Added new observation of BUN: 65 mg/dL (05/17/2003 12:39) Added new observation of BG RANDOM: 81 mg/dL (05/17/2003 12:39) Added new observation of CL SERUM: 103 meq/L (05/17/2003 12:39) Added new observation of K SERUM: 4.8 meq/L (05/17/2003 12:39) Added new observation of NA: 134 meq/L (05/17/2003 12:39) Added new observation of CALCIUM: 7.7 mg/dL (05/12/2003 12:39) Added new observation of CREATININE: 10.8 mg/dL (05/12/2003 12:39) Added new observation of BUN: 55 mg/dL (05/12/2003 12:39) Added new observation of BG RANDOM: 93 mg/dL (05/12/2003 12:39) Added new observation of CO2 PLSM/SER: 22 meq/L (05/12/2003 12:39) Added new observation of CL SERUM: 99 meq/L (05/12/2003 12:39) Added new observation of K SERUM: 3.8 meq/L (05/12/2003 12:39) Added new observation of NA: 131 meq/L (05/12/2003 12:39) Added new observation of PLATELETK/UL: 214 K/uL (05/12/2003 12:39) Added new observation of MCV: 92.8 fL (05/12/2003  12:39) Added new observation of HCT: 23.8 % (05/12/2003 12:39) Added new observation of HGB: 8.0 g/dL (05/12/2003 12:39) Added new observation of WBC COUNT: 2.4 10*3/microliter (05/12/2003 12:39) Added new observation of CALCIUM: 8.5 mg/dL (05/09/2003 12:39) Added new observation of CREATININE: 12.1 mg/dL (05/09/2003 12:39) Added new observation of BUN: 61 mg/dL (05/09/2003 12:39) Added new observation of BG RANDOM: 113 mg/dL (05/09/2003 12:39) Added new observation of CO2 PLSM/SER: 23 meq/L (05/09/2003 12:39) Added new observation of CL SERUM: 103 meq/L (05/09/2003 12:39) Added new observation of K SERUM: 7.9 meq/L (05/09/2003 12:39) Added new observation of NA: 135 meq/L (05/09/2003 12:39) Added new observation of PLATELETK/UL: 115 K/uL (05/09/2003 12:39) Added new observation of MCV: 96.6 fL (05/09/2003 12:39) Added new observation of HCT: 29.1 % (05/09/2003 12:39) Added new observation of HGB: 10.0 g/dL (05/09/2003 12:39) Added new observation of WBC COUNT: 22.5 10*3/microliter (05/09/2003 12:39) Added new observation of PLATELETK/UL: 170 K/uL (05/08/2003 12:39) Added new observation of MCV: 97.4 fL (05/08/2003 12:39) Added new observation of HCT: 36.0 % (05/08/2003 12:39) Added new observation of HGB: 12.2 g/dL (05/08/2003 12:39) Added new observation of WBC COUNT: 16.3 10*3/microliter (05/08/2003 12:39)

## 2011-01-16 ENCOUNTER — Ambulatory Visit: Payer: Medicare Other | Admitting: Vascular Surgery

## 2011-01-31 ENCOUNTER — Ambulatory Visit: Payer: Medicare Other | Admitting: Vascular Surgery

## 2011-02-07 ENCOUNTER — Ambulatory Visit: Payer: Medicare Other | Admitting: Vascular Surgery

## 2011-02-08 LAB — COMPREHENSIVE METABOLIC PANEL
ALT: <8 U/L (ref 0–35)
AST: 13 U/L (ref 0–37)
Albumin: 3.3 g/dL — ABNORMAL LOW (ref 3.5–5.2)
Alkaline Phosphatase: 53 U/L (ref 39–117)
BUN: 39 mg/dL — ABNORMAL HIGH (ref 6–23)
CO2: 26 mEq/L (ref 19–32)
Calcium: 8.2 mg/dL — ABNORMAL LOW (ref 8.4–10.5)
Chloride: 100 mEq/L (ref 96–112)
Creatinine, Ser: 9.56 mg/dL — ABNORMAL HIGH (ref 0.4–1.2)
GFR calc Af Amer: 5 mL/min — ABNORMAL LOW (ref >60)
GFR calc non Af Amer: 5 mL/min — ABNORMAL LOW (ref >60)
Glucose, Bld: 118 mg/dL — ABNORMAL HIGH (ref 70–99)
Potassium: 4.4 mEq/L (ref 3.5–5.1)
Sodium: 136 mEq/L (ref 135–145)
Total Bilirubin: 0.5 mg/dL (ref 0.3–1.2)
Total Protein: 6.1 g/dL (ref 6.0–8.3)

## 2011-02-08 LAB — RENAL FUNCTION PANEL
Albumin: 3.3 g/dL — ABNORMAL LOW (ref 3.5–5.2)
BUN: 73 mg/dL — ABNORMAL HIGH (ref 6–23)
CO2: 21 mEq/L (ref 19–32)
Calcium: 7.9 mg/dL — ABNORMAL LOW (ref 8.4–10.5)
Chloride: 97 mEq/L (ref 96–112)
Creatinine, Ser: 13.87 mg/dL — ABNORMAL HIGH (ref 0.4–1.2)
GFR calc Af Amer: 4 mL/min — ABNORMAL LOW (ref >60)
GFR calc non Af Amer: 3 mL/min — ABNORMAL LOW (ref >60)
Glucose, Bld: 108 mg/dL — ABNORMAL HIGH (ref 70–99)
Phosphorus: 10.2 mg/dL (ref 2.3–4.6)
Potassium: 4 mEq/L (ref 3.5–5.1)
Sodium: 136 mEq/L (ref 135–145)

## 2011-02-08 LAB — CBC
HCT: 34.3 % — ABNORMAL LOW (ref 36.0–46.0)
HCT: 35.1 % — ABNORMAL LOW (ref 36.0–46.0)
Hemoglobin: 11.7 g/dL — ABNORMAL LOW (ref 12.0–15.0)
Hemoglobin: 11.7 g/dL — ABNORMAL LOW (ref 12.0–15.0)
MCH: 31.8 pg (ref 26.0–34.0)
MCH: 32.2 pg (ref 26.0–34.0)
MCHC: 33.3 g/dL (ref 30.0–36.0)
MCHC: 34.1 g/dL (ref 30.0–36.0)
MCV: 94.5 fL (ref 78.0–100.0)
MCV: 95.4 fL (ref 78.0–100.0)
Platelets: 164 10*3/uL (ref 150–400)
Platelets: 213 10*3/uL (ref 150–400)
RBC: 3.63 MIL/uL — ABNORMAL LOW (ref 3.87–5.11)
RBC: 3.68 MIL/uL — ABNORMAL LOW (ref 3.87–5.11)
RDW: 14.3 % (ref 11.5–15.5)
RDW: 14.4 % (ref 11.5–15.5)
WBC: 7.3 10*3/uL (ref 4.0–10.5)
WBC: 7.3 10*3/uL (ref 4.0–10.5)

## 2011-02-08 LAB — GLUCOSE, CAPILLARY: Glucose-Capillary: 162 mg/dL — ABNORMAL HIGH (ref 70–99)

## 2011-02-08 LAB — POCT I-STAT 4, (NA,K, GLUC, HGB,HCT)
Glucose, Bld: 86 mg/dL (ref 70–99)
HCT: 35 % — ABNORMAL LOW (ref 36.0–46.0)
Hemoglobin: 11.9 g/dL — ABNORMAL LOW (ref 12.0–15.0)
Potassium: 4.2 mEq/L (ref 3.5–5.1)
Sodium: 135 mEq/L (ref 135–145)

## 2011-02-08 LAB — SURGICAL PCR SCREEN
MRSA, PCR: NEGATIVE
Staphylococcus aureus: NEGATIVE

## 2011-02-08 LAB — PHOSPHORUS: Phosphorus: 5.7 mg/dL — ABNORMAL HIGH (ref 2.3–4.6)

## 2011-02-11 LAB — POCT I-STAT 4, (NA,K, GLUC, HGB,HCT)
Glucose, Bld: 78 mg/dL (ref 70–99)
HCT: 42 % (ref 36.0–46.0)
Hemoglobin: 14.3 g/dL (ref 12.0–15.0)
Potassium: 4.2 mEq/L (ref 3.5–5.1)
Sodium: 139 mEq/L (ref 135–145)

## 2011-02-11 LAB — SURGICAL PCR SCREEN
MRSA, PCR: NEGATIVE
Staphylococcus aureus: NEGATIVE

## 2011-02-12 LAB — CBC
HCT: 33.2 % — ABNORMAL LOW (ref 36.0–46.0)
Hemoglobin: 11.4 g/dL — ABNORMAL LOW (ref 12.0–15.0)
MCHC: 34.3 g/dL (ref 30.0–36.0)
MCV: 96.9 fL (ref 78.0–100.0)
Platelets: 193 10*3/uL (ref 150–400)
RBC: 3.42 MIL/uL — ABNORMAL LOW (ref 3.87–5.11)
RDW: 15.3 % (ref 11.5–15.5)
WBC: 10.2 10*3/uL (ref 4.0–10.5)

## 2011-02-12 LAB — RENAL FUNCTION PANEL
Albumin: 3.2 g/dL — ABNORMAL LOW (ref 3.5–5.2)
BUN: 74 mg/dL — ABNORMAL HIGH (ref 6–23)
CO2: 25 mEq/L (ref 19–32)
Calcium: 7.4 mg/dL — ABNORMAL LOW (ref 8.4–10.5)
Chloride: 97 mEq/L (ref 96–112)
Creatinine, Ser: 15.83 mg/dL — ABNORMAL HIGH (ref 0.4–1.2)
GFR calc Af Amer: 3 mL/min — ABNORMAL LOW (ref >60)
GFR calc non Af Amer: 3 mL/min — ABNORMAL LOW (ref >60)
Glucose, Bld: 83 mg/dL (ref 70–99)
Phosphorus: 10.4 mg/dL (ref 2.3–4.6)
Potassium: 4.8 mEq/L (ref 3.5–5.1)
Sodium: 138 mEq/L (ref 135–145)

## 2011-02-12 LAB — POCT I-STAT 4, (NA,K, GLUC, HGB,HCT)
Glucose, Bld: 72 mg/dL (ref 70–99)
HCT: 38 % (ref 36.0–46.0)
Hemoglobin: 12.9 g/dL (ref 12.0–15.0)
Potassium: 4.5 mEq/L (ref 3.5–5.1)
Sodium: 132 mEq/L — ABNORMAL LOW (ref 135–145)

## 2011-02-12 LAB — PROTIME-INR
INR: 1.09 (ref 0.00–1.49)
Prothrombin Time: 14 seconds (ref 11.6–15.2)

## 2011-02-12 LAB — POTASSIUM: Potassium: 4.2 mEq/L (ref 3.5–5.1)

## 2011-02-12 LAB — APTT: aPTT: 28 seconds (ref 24–37)

## 2011-02-14 LAB — POTASSIUM: Potassium: 4.8 mEq/L (ref 3.5–5.1)

## 2011-02-16 ENCOUNTER — Ambulatory Visit: Payer: Medicare Other | Admitting: Vascular Surgery

## 2011-02-17 ENCOUNTER — Emergency Department: Payer: Medicare Other | Admitting: Emergency Medicine

## 2011-02-21 ENCOUNTER — Ambulatory Visit: Payer: Medicare Other | Admitting: Vascular Surgery

## 2011-02-22 ENCOUNTER — Ambulatory Visit: Payer: Medicare Other | Admitting: Vascular Surgery

## 2011-03-01 LAB — POTASSIUM: Potassium: 5.9 mEq/L — ABNORMAL HIGH (ref 3.5–5.1)

## 2011-03-12 LAB — POTASSIUM: Potassium: 5.5 mEq/L — ABNORMAL HIGH (ref 3.5–5.1)

## 2011-03-21 ENCOUNTER — Ambulatory Visit: Payer: Medicare Other | Admitting: Vascular Surgery

## 2011-04-10 NOTE — Assessment & Plan Note (Signed)
OFFICE VISIT   Lori [name], Lori [name]  DOB:  11-Dec-1970                                       08/09/2008  [id number]   REASON FOR VISIT:  Followup.   HISTORY:  This is a 40 year old female, end-stage renal disease, who presented  with an infected portion of her left thigh graft.  A 3 cm section of the  graft was resected, and a new interposition graft was placed.  This was  done on 07/27/08.  Since removal of her graft, she has defervesced.  She  had a small wound that is granulating in and is approximately 1.5 cm x  0.5 cm.  She will continue with wet-to-dry dressing changes.  I will  have her follow up on a p.r.n. basis.  There is no evidence of infection  at this time.   Eldridge Abrahams, MD  Electronically Signed   VWB/MEDQ  D:  08/09/2008  T:  08/10/2008  Job:  [id number]

## 2011-04-10 NOTE — Assessment & Plan Note (Signed)
OFFICE VISIT   Lori [name], Lori [name]  DOB:  [birth year]                                       05/12/2010  [id number]   REASON FOR VISIT:  Left thigh staple removal and discuss new  hemodialysis access.   Patient is a 40 year old Caucasian female with end-stage renal disease  who has dialysis Tuesday, Thursday, Saturday at [facility].  She has multiple failed hemodialysis accesses.  She has had  bilateral upper extremity arm grafts and most recently a left thigh  graft that was initially placed in November 1999 by Dr. Scot Dock.  She  has had multiple revisions and actually had a new left thigh graft  placed in May 2005 by Dr. Donnetta Hutching.  This again was revised in September  2009 by Dr. Napoleon Form, and he replaced a 3 cm segment of  graft.  This thrombosed again in June of this year, and she was taken to  the operating room by Dr. Kellie Simmering who attempted a thrombectomy, but the  graft was too degenerated and not felt as salvageable.  He ultimately  placed a right IJ palindrome catheter as her left internal jugular vein  was occluded.  Today she is here for staple removal of her left thigh  and left groin incision.  She has had no fevers or drainage.  She has  had some left intermittent sharp left thigh pains which sound more  related to nerve pain.  She is still requiring pain medication but says  the oxycodone is causing her to itch but says she has taken Tylox in the  past without difficulty.  In regards to a new access, she is reluctant  to consider a right thigh graft.  Apparently she had an extensive  hematoma in the right groin which ultimately required debridement  because of large nonviable skin.  This was done by Dr. Donnetta Hutching in August  2001.  She says that her right arm grafts were removed secondary to  infection and that she was told that in the future her right arm could  be considered for future hemodialysis access.   Her past  medical history is significant for hypertension, asthma,  tonsillectomy, tubal ligation, cesarean section, thyroidectomy and  hemodialysis access.   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   Her family history is significant for a heart attack in her mother and  end-stage renal disease with dialysis in her father.   Review of systems is positive for height of 5 feet 4 inches, weight of  103.8 kg.  She reports pain in her legs with walking, history of heart  murmur, reflux, trouble swallowing, dizziness, asthma, anemia,  arthritis, and joint pains.  She does take karate.   Physical exam shows a heart rate of 75, oxygen saturation 99% on room  air, respirations 20.  General appearance shows an obese white female,  pleasant, in no acute distress.  Lung sounds are clear.  Her heart has a  regular rate and rhythm.  She has a 2/6 systolic ejection murmur.  Abdomen is soft with normal bowel sounds.  She has 1+ to 2+ radial and  brachial pulses on her right arm.  She has bilateral anterior tibial  Doppler signals, which is stronger on the right.  Her left groin shows  incision with  staples, which were removed.  There was a small amount of  redness limited to the actual staple line.  There is no evidence of  hematoma or dehiscence.   Patient is overall doing well following thrombectomy of her left thigh  graft.  As mentioned, I did remove the staples.  With removing the  staples, there was a small amount of bleeding.  I did place a gauze  dressing over this and told her she may remove it in 24 hours, then  clean the incision daily with soap and water and cover as needed for  drainage.  The redness seemed more related to staple reaction and not  actual infection.  I did tell her to notify us if she has increasing  redness or purulent drainage or separation of the incision site.  In  regards to the hemodialysis access, she is reluctant to consider right  thigh graft.  I  spoke with Dr. Donnetta Hutching.  He did feel that we could have  interventional radiology perform a venogram of her right upper extremity  to see if she was in fact a candidate for right upper arm dialysis  access.  Our office will contact interventional radiology regarding  scheduling this in the near future.  She will then follow up with one of  our surgeons in approximately 2 weeks to discuss the results and future  dialysis access.  I did prescribe 15 tablets of Tylox 1 tablet p.o.  q.4h. p.r.n. for pain to see if this would help with her left thigh  pain.   Jacinta Shoe, PA   Rosetta Posner, M.D.  Electronically Signed   AWZ/MEDQ  D:  05/12/2010  T:  05/13/2010  Job:  [id number]   cc:   Darrold Span. Florene Glen, M.D.

## 2011-04-10 NOTE — Discharge Summary (Signed)
NAMEDONTIA, ZORTMAN                  ACCOUNT NO.:  [id number]   MEDICAL RECORD NO.:  [id number]          PATIENT TYPE:  INP   LOCATION:  [id number]                         FACILITY:  [facility]   PHYSICIAN:  Theotis Burrow IV, MDDATE OF BIRTH:  March 10, 1971   DATE OF ADMISSION:  07/27/2008  DATE OF DISCHARGE:  07/29/2008                               DISCHARGE SUMMARY   FINAL DIAGNOSES:  1. Drainage from atrioventricular graft.  2. End-stage renal disease.  3. Hypertension.  4. Hypothyroidism.  5. Coronary artery disease.   PROCEDURE PERFORMED:  July 27, 2008, resection of infected portion  of the left thigh graft with revision using a 6-mm interposition graft  by Dr. Trula Slade.   COMPLICATIONS:  None.   DISCHARGE MEDICATIONS:  She was instructed to resume her preoperative  medicines consisting of,  1. Tums E-X 750 mg 3 by mouth daily.  2. PhosLo 667 mg tablets 4 tablets with meals.  3. Synthroid 300 mcg p.o. daily.  4. Aspirin 81 mg p.o. daily.  5. Omeprazole 40 mg p.o. daily.  6. Albuterol inhaler 2 puffs daily.   She was given a prescription for Tylox 1-2 tablets p.o. q.6 h. p.r.n.  pain total number of 40 was given.  At discharge, she is also to receive  vancomycin 1 g and Fortaz 2 g IV with each hemodialysis treatment.   DISPOSITION:  She is being discharged home after hemodialysis.  She is  to continue to follow up with hemodialysis.  She will have an  appointment with Dr. Trula Slade in 1 week for his evaluation of her wound.  Office will arrange the appointment.   BRIEF IDENTIFYING STATEMENT:  For complete details, please refer the  typed history and physical.  Briefly, this is a pleasant 40 year old  woman presented with a painful left thigh AVGs.  She was evaluated by  Dr. Trula Slade who felt that she should go to the operating room and have  her [facility] without revision of the graft.  She was informed of the risks  and benefits of the procedure, and after careful consideration,  elected  to proceed with surgery.   HOSPITAL COURSE:  Preoperative workup was completed.  She was taken to  the operating room for the aforementioned I and D with graft revision.  For complete details, please refer the typed operative report.  She  tolerated the procedure and was returned to the postanesthesia care unit  extubated.  In the postanesthesia care unit, she developed hypotension.  She was then transferred to a bed in a surgical intensive care unit and  placed on sepsis  protocol.  Her hemodynamic returned to normal.  She remained in house  under observation until July 29, 2008.  She was doing well.  She  underwent hemodialysis and was subsequently discharged home.   CONDITION ON DISCHARGE:  Stable, improved.      Chad Cordial, PA      V. Leia Alf, MD  Electronically Signed    KEL/MEDQ  D:  07/29/2008  T:  07/30/2008  Job:  [id number]

## 2011-04-10 NOTE — Op Note (Signed)
Lori [name], Lori [name]                  ACCOUNT NO.:  [id number]   MEDICAL RECORD NO.:  [id number]          PATIENT TYPE:  INP   LOCATION:  2312                         FACILITY:  [facility]   PHYSICIAN:  Theotis Burrow IV, MDDATE OF BIRTH:  Jan 21, 1971   DATE OF PROCEDURE:  07/27/2008  DATE OF DISCHARGE:                               OPERATIVE REPORT   PREOPERATIVE DIAGNOSIS:  Rule out infected left thigh graft.   POSTOPERATIVE DIAGNOSIS:  Rule out infected left thigh graft.   PROCEDURE PERFORMED:  Resection of 3-cm segment of graft with revision  using an interposition 6-mm Gore-Tex graft.   ANESTHESIA:  General.   SPECIMENS:  Involved portion of the graft.  Cultures of the wound were  taken.   BLOOD LOSS:  Minimal.   PROCEDURE:  The patient was identified in the holding area and taken to  room-9.  She was placed supine on the table.  General endotracheal  anesthesia was administered.  The left leg was prepped and draped in  standard sterile fashion.  Time-out was called.  Antibiotics were given.  An incision was made with #10 blade over the area with the drainage.  A  sharp dissection was used to carry down to the graft.  Graft appeared to  be well incorporated at this time; however, there was a large defect in  the graft likely from the sheath used for interventional procedure.  Since there was a small amount of drainage coming from this area of the  wound and given the patient's high fevers, I elected to disresect this  portion of the graft and place an interposition graft, tunneled out  lateral.  The incisions were made proximal and distal to the involved  area, and the graft was exposed to these small incisions.  At this  point, the patient was given systemic heparin and prior to heparin, a  tunnel between the 2 incisions was created.  The graft was then  transected and the portion of the graft to be excluded was resected.  Again, this was densely adherent to the surrounding  tissue.  A 6-mm  graft was then sewn end-to-end with a 6-0 Prolene to each venous and  arterial side.  Prior to releasing the occlusion clamps, the graft was  flushed appropriately and back bled appropriately.  Flow was restored.  The patient was given 50 mg of protamine.  The wounds were then  irrigated.  Once hemostasis was adequate, the incisions where the graft  was anastomosed were closed with 3-0 and 4-0 Vicryl.  The opening where  the graft was resected was packed with 4 x 4's.  The patient was then  extubated and taken to recovery room in stable condition.  There were no  complications.           ______________________________  V. Leia Alf, MD  Electronically Signed     VWB/MEDQ  D:  07/27/2008  T:  07/28/2008  Job:  [id number]

## 2011-04-10 NOTE — H&P (Signed)
Lori [name], Lori [name]                  ACCOUNT NO.:  [id number]   MEDICAL RECORD NO.:  [id number]          PATIENT TYPE:  OUT   LOCATION:  XRAY                         FACILITY:  [facility]   PHYSICIAN:  James L. Deterding, M.D.DATE OF BIRTH:  14-Nov-1971   DATE OF ADMISSION:  02/24/2008  DATE OF DISCHARGE:                              HISTORY & PHYSICAL   CHIEF COMPLAINT:  Hemodialysis catheter placement for hemodialysis,  potassium was 6.7.   HISTORY OF PRESENT ILLNESS:  The patient is a 40 year old white female  with end-stage renal disease, on hemodialysis Tuesday, Thursday, and  Saturday who was at dialysis this morning at the [facility] and found to have a clotted left femoral AV graft.  She was sent  to Interventional Radiology at [facility] for a thrombectomy.  Her  preprocedure labs revealed that she had a potassium of 6.7; therefore,  was necessitated that she have a graft placed in her right side, and she  is having hemodialysis today.   PAST MEDICAL HISTORY:  1. Significant for end-stage renal disease secondary to membranous      glomerulonephritis.  2. Anemia of chronic disease.  3. Secondary hyperparathyroidism status post parathyroidectomy.  4. Hypothyroidism status post thyroidectomy, therapeutic.  5. History of hypertension.  6. Multiple access placements and failures.   ALLERGIES:  SHE HAS ALLERGIES TO COUMADIN.   MEDICATIONS:  1. Tums E-X 750 mg 3 twice a day with meals.  2. PhosLo 667 4 tablets with meals.  3. Synthroid 300 mcg daily.  4. Aspirin 81 mg daily.  5. Omeprazole 40 mg daily.  6. Albuterol metered-dose inhaler 2 puffs daily.  7. Mucinex DM.   Her hemodialysis prescription is frequency of Tuesday, Thursday, and  Saturday and the estimated dry weight of 106.5 kilograms, length is 4  hours.  Her access is a clotted left femoral AVG.  Her hemodialysis  medications include Epogen of 1000 units 3 times a week, calcitriol 0.75  mcg 3 times a  week, and heparin 3 units 3 times a week.  Her blood flow  rate is 450, dialysis flow rate 800.  She is also on levocarnitine 2000  mg 3 times a week.   REVIEW OF SYSTEMS:  Included some nasal congestion where she is on  Mucinex for and orthopnea that she states has been present for sometime.  All others systems were negative.   PHYSICAL EXAMINATION:  VITAL SIGNS:  Temperature of 97, pulse of 71,  respiratory rate of 20, blood pressure of 132/52, and weight was 113.7  kg.  GENERAL EXAM:  She is agitated from the procedure of the catheter  placement.  HEENT:  Normocephalic, atraumatic.  EOMI.  NECK:  Supple without lymphadenopathy.  CVS:  Heart rate was regular rate and rhythm.  S1, S2.  No thrill or  bruit appreciated.  LUNGS:  Bibasilar crackles.  SKIN:  Multiple ecchymoses that were not new.  EXTREMITIES:  Pulses were 2+ and equal.  She had 1+ edema upper  extremities and lower extremities.  Access is a clotted left femoral  graft.  NEURO:  She is alert and oriented x3.  Cranial nerves II through XII are  grossly intact.   LABORATORY DATA:  At present are potassium of 6.7.   ASSESSMENT/PLAN:  The patient is 39 year old white female with end-stage  renal disease, on hemodialysis Tuesday, Thursday, and Saturday with a  clotted femoral graft who is here for Intervention Radiology performed  thrombectomy with preprocedure labs of potassium of 6.7.  1. End-stage renal disease.  She is having hemodialysis today.  Her      estimated dry weight is 106.5 kilograms.  Dr. Jimmy Footman placed      femoral catheter for hemodialysis today.  This is to be taken out      after the procedure.  The patient will return tomorrow for the      thrombectomy at [facility].  2. Hyperkalemia.  On hemodialysis today, she will have 0 K bath for 1      hour and then regular potassium for 3 hours.  This should bring her      potassium down and she has to repeat potassium after hemodialysis.  3. Anemia of  chronic disease.  She is on Epogen 1000 units with      hemodialysis.  4. Secondary hyperparathyroid.  She is on PhosLo and Tums, as well as      calcitriol 0.75 mcg with hemodialysis.  5. Vascular access.  She has a clotted femoral arteriovenous graft,      left leg.  She is to return to Interventional Radiology tomorrow.           ______________________________  Joyice Faster Deterding, M.D.     JLD/MEDQ  D:  02/24/2008  T:  02/25/2008  Job:  [id number]

## 2011-04-10 NOTE — Assessment & Plan Note (Signed)
OFFICE VISIT   Lori [name], Lori [name]  DOB:  [birth year]                                       05/24/2010  [id number]   The patient returns for followup today.  She was seen on May 12, 2010  by Myra Gianotti.  At that point she was being considered for a new  dialysis access after she had recently failed thrombectomy in her thigh  graft.  She currently is dialyzing via a catheter.  She returns today to  discuss the findings of her upper extremity venogram.   PHYSICAL EXAMINATION:  Blood pressure 121/75 in the left arm, heart rate  73 and regular.  Right upper extremity:  She has a difficult to palpate  antecubital brachial pulse but has dense scar tissue in this area.  She  does have a 1+ right radial pulse.  She also has scars on the right arm  consistent with previous forearm graft.   I had a lengthy discussion with the patient today.  She previously had a  right forearm AV graft which had to be removed for infection.  This  apparently was several years ago, and she has not had any recurrent  cellulitis or problems in the right arm.  She had a central venogram  performed in radiology on June 24th which shows that her right axillary  vein and central veins are widely patent.  Since she has now failed two  thigh grafts, I believe the best option for her would be to go back to  the right upper extremity.  I did explain to her that she would be at  slight increased risk of infection also due to her slightly abnormal  pulse exam on the right side.  I also discussed with her the fact that  she may be at risk for steal as well, but this would still be her best  long-term option.  She understands and agrees to proceed.  Since I will  be out of town, I have scheduled her for a right upper arm loop graft by  Dr. Scot Dock on June 07, 2010.  The risks, benefits and possible  complications and procedure details were explained to the patient today  including bleeding,  infection, ischemic steal graft, thrombosis.  She  understands and agrees to proceed.     Jessy Oto. Fields, MD  Electronically Signed   CEF/MEDQ  D:  05/24/2010  T:  05/25/2010  Job:  [id number]   cc:   [facility]

## 2011-04-13 NOTE — H&P (Signed)
Lori [name], Lori [name]                  ACCOUNT NO.:  [id number]   MEDICAL RECORD NO.:  [id number]          PATIENT TYPE:  INP   LOCATION:  5504                         FACILITY:  [facility]   PHYSICIAN:  Earnstine Regal, MD      DATE OF BIRTH:  1971-04-30   DATE OF PROCEDURE:  DATE OF DISCHARGE:  10/02/2006                    STAT - MUST CHANGE TO CORRECT WORK TYPE   REASON FOR ADMISSION:  Persistent secondary hypoparathyroidism, endstage  renal disease,   HISTORY OF PRESENT ILLNESS:  Patient is a 40 year old white female with  longstanding endstage renal disease and longstanding secondary  hyperparathyroidism.  She has had a complex previous past surgical  history.  The patient has undergone neck exploration on two occasions by  Dr. Benard Rink in 2000.  After an extensive and lengthy workup, she  has localized abnormal parathyroid hormone secretion by selective venous  sampling to a right mediastinal vein.  The patient now comes to surgery  for a neck and chest exploration for parathyroidectomy.   HOSPITAL COURSE:  Patient is admitted on the surgical service September 27, 2006.  She was taken to the operating room by Dr. Armandina Gemma and Dr.  Marlyn Corporal, for neck exploration and resection of ectopic  parathyroid gland.  Postoperatively, the patient did well.  She was  followed in consultation by the nephrology service.  Patient had  significant fall in serum calcium levels to a low of 6.2.  She was  replenished.  She received hemodialysis during her hospitalization.  Once calcium level stabilized, the patient was prepared for discharge  home on October 02, 2006.   DISCHARGE PLANNING:  Patient was discharged home October 02, 2006 in  good condition, tolerating a renal diet, with a calcium level of 7.5.  She will be seen by nephrology at 3 times weekly hemodialysis.  She will  be seen back in my office in 2 weeks for re-evaluation.   FINAL DIAGNOSES:  Secondary hyperparathyroidism,  end-stage renal  disease.   Condition at discharge is improved.      Earnstine Regal, MD  Electronically Signed     TMG/MEDQ  D:  12/05/2006  T:  12/05/2006  Job:  [id number]   cc:   Earnstine Regal, MD

## 2011-04-13 NOTE — Discharge Summary (Signed)
Lori [name], Lori [name]                  ACCOUNT NO.:  [id number]   MEDICAL RECORD NO.:  [id number]          PATIENT TYPE:  INP   LOCATION:  [id number]                         FACILITY:  [facility]   PHYSICIAN:  Earnstine Regal, MD      DATE OF BIRTH:  12-13-1970   DATE OF ADMISSION:  09/27/2006  DATE OF DISCHARGE:  10/02/2006                               DISCHARGE SUMMARY   NO DICTATION.      Earnstine Regal, MD  Electronically Signed     TMG/MEDQ  D:  12/20/2006  T:  12/20/2006  Job:  [id number]

## 2011-04-13 NOTE — Consult Note (Signed)
[place]  Patient:    Lori [name], Lori [name]                         MRN: [id number] Proc. Date: 06/14/00 Adm. Date:  [id number] Attending:  Allyn Kenner Dictator:   Jerilynn Birkenhead, M.D.                          Consultation Report  DATE OF BIRTH:  1971/09/29  REQUESTING PHYSICIAN:  Jaci Carrel, M.D.  REASON FOR CONSULTATION:  The patient has end-stage renal disease on hemodialysis, here status post thyroidectomy.  HISTORY OF PRESENT ILLNESS:  Lori [name] is a 40 year old white woman with end-stage renal disease on hemodialysis with persistent secondary hyperparathyroidism status post subtotal parathyroidectomy and left thyroid lobectomy 2000.  The patient is here now for a neck exploration and right thyroid lobectomy.  Postoperatively the patient potassium initially was 5.2, and then went to 6.4. EKG showed mildly peaked T waves and QRS widening.  The patient was taken to emergent hemodialysis (normal schedules Tuesday, Thursday, Saturday at [place].  PAST MEDICAL HISTORY: 1. End-stage renal disease secondary to glomerulonephritis.  Dialysis since    November 1977. 2. Secondary hyperparathyroidism status post subtotal parathyroidectomy in    2000 with reimplantation in the right forearm. 3. Failed chronic ambulatory peritoneal dialysis in 2000 secondary to    peritonitis. 4. Status post multiple vascular access surgeries the current one is the left    thigh graft placed in 2000, by Dr. Scot Dock. 5. Status post left thyroid lobectomy.  MEDICATIONS: 1. Os-Cal 500 plus D 5 tablets p.o. t.i.d. with meals. 2. Synthroid 0.25 mg p.o. q.d. 3. Labetalol 200 mg p.o. b.i.d. 4. Norvasc 10 mg p.o. q.d. 5. Enalapril 10 mg 2 tablets p.o. q.h.s.  ALLERGIES:  No known drug allergies.  SOCIAL HISTORY:  The patient is a 40 year old married woman. She lives with her husband and 24-year-old daughter in [place].  She worked as a [employer] manager.   No tobacco no alcohol.  FAMILY HISTORY:  Father had hemodialysis, diabetes in sister, coronary artery disease in her mother.  REVIEW OF SYSTEMS:  Complained of neck pain.  PHYSICAL EXAMINATION:  VITAL SIGNS:  Temperature 97.1, blood pressure is 150/85, heart rate is 60, respiratory rate is 16.  GENERAL:  The patient is lethargic, she is postop.  HEENT:  Normocephalic and atraumatic.  She has a sallow skin color.  PERRL, EOMI.  Oropharynx:  Moist mucous membranes.  NECK:  No JVD, no lymphadenopathy.  CHEST:  Clear to auscultation bilaterally on anterior.  CARDIOVASCULAR:  Regular rate and rhythm. No murmur, rub or gallops.  ABDOMEN:  Obese, soft, nontender, nondistended.  No hepatosplenomegaly.  EXTREMITIES:  Trace edema.  SKIN:  No lesions.  LABORATORY DATA:  Potassium immediately pre hemodialysis was 7.  ASSESSMENT:  40 year old postop from exploratory neck surgery and right thyroid lobectomy with postoperative hyperkalemia.  PLAN: 1. End-stage renal disease emergent hemodialysis today.  Hemodialysis planned    again for tomorrow, then likely she should return to her normal Tuesday,    Thursday, Saturday scheduled. 2. Postop thyroidectomy as per primary. DD:  06/14/00 TD:  06/17/00 Job: [id number]

## 2011-04-13 NOTE — Discharge Summary (Signed)
[place]. [facility]  Patient:    Lori [name]                          MRN: [id number] Adm. Date:  [id number] Disc. Date: [id number] Attending:  Dorothea Glassman Dictator:   Shelle Iron, P.A. CC:         Dr. Brett Albino, [place], [place]. Scot Dock, M.D.   Discharge Summary  DATE OF BIRTH:  October 29, 1971.  FINAL DIAGNOSES 1. Clotted left thigh arteriovenous Gore-Tex graft. 2. End-stage renal disease. 3. Anemia. 4. Hypothyroid. 5. Hypertension.  PROCEDURES 1. June 19, 2000, thrombectomy of left thigh A-V Gore-Tex graft. 2. June 24, 2000, shuntogram.  SURGEON:  Dr. Judeth Cornfield. Scot Dock.  HISTORY:  This is a 40 year old female with end-stage renal disease who is on chronic dialysis.  Patient presented at this time with a clotted left A-V Gore-Tex graft.  Because of this, she was admitted for thrombectomy.  HOSPITAL COURSE:  On June 22, 2000, patient was admitted to [facility] and at that time, underwent thrombectomy of left thigh graft.  This was done successfully.  Postoperatively, this graft remained open.  Patient would have normally left after 23-hour observation but patient had a hemoglobin that was 6.6 and because of this, it was decided to keep her and transfuse two units of packed red cells after dialysis and then recheck her blood in the morning; subsequently, she was admitted.  The following day, June 23, 2000, the A-V Gore-Tex graft continued to have a good thrill.  The hemoglobin and hematocrit had shown satisfactory improvement.  The hemoglobin on June 23, 2000 was 10.3.  Hematocrit was 28.1.  On June 24, 2000, the hemoglobin was 9.7 and the hematocrit 26.9.  Patient was kept an extra day because Dr. Scot Dock wanted to check the shunt out by having the patient undergo a shuntogram to see if there were any strictures or stenoses in the shunt; subsequently, on June 24, 2000, patient underwent a shuntogram  which revealed no intragraft anastomotic or venous stenoses.  Subsequently, patient was then prepared for discharge.  She was to dialyze the next day and subsequently, she was kept till June 25, 2000 and at that time she underwent dialysis that morning and was then discharged in the afternoon after dialysis.  DISCHARGE MEDICATIONS 1. Normodyne 200 mg b.i.d. 2. Norvasc 10 mg q.d. 3. Enalapril 10 mg q.h.s. 4. Synthroid 2 mg q.d. 5. Calcium 500 mg p.o. with meals. 6. Nephro-Vite one q.d. 7. Patient is also given Phenergan 5/325 mg one or two p.o. q.4-6h. p.r.n.    pain.  FOLLOWUP:  She is to follow up per dialysis after discharge at this time.  DISPOSITION:  Patient is subsequently discharged home in satisfactory and stable condition with satisfactory thrill and bruit noted in the left thigh A-V graft. DD:  06/25/00 TD:  06/26/00 Job: [id number]

## 2011-04-13 NOTE — Op Note (Signed)
[place]. [facility]  Patient:    Lori [name], Lori [name]                         MRN: [id number] Proc. Date: 07/05/00 Adm. Date:  [id number] Attending:  Manuella Ghazi                           Operative Report  PREOPERATIVE DIAGNOSIS:  Hematoma right thigh with large nonviable skin eschar.  POSTOPERATIVE DIAGNOSIS:  Hematoma right thigh with large nonviable skin eschar.  PROCEDURE:  Debridement of right thigh and evacuation of large right thigh hematoma.  SURGEON:  Rosetta Posner, M.D.  ASSISTANT:  Nurse.  ANESTHESIA:  General endotracheal.  COMPLICATIONS:  None.  INDICATIONS FOR PROCEDURE:  The patient is a 40 year old white female, an end-stage renal disease patient who 2 to 3 weeks prior had a right femoral temporary dialysis catheter.  The patient suffered a very large hematoma following this.  I saw her for the first time yesterday in the office, and she had a large 5 x 10 cm full-thickness skin loss eschar over her thigh with a very large hematoma underlying this.  It was recommended that she undergo admission for debridement and evacuation of this hematoma.  I also explained that she would require a protracted period of wound closure and packing due to the large size and the large skin deficit.  DESCRIPTION OF PROCEDURE:  The patient was brought to the operating room, placed in supine position.  The area of the right groin and right thigh were prepped and draped in the usual sterile fashion.  The eschar was excised in its entirety.  The large hematoma was evacuated, and the resulting defect was copiously irrigated with saline.  The wound was packed open with saline soaked Kerlix.  A sterile dressing was applied, and the patient was taken to the recovery room in stable condition. DD:  07/05/00 TD:  07/06/00 Job: [id number]

## 2011-04-13 NOTE — Op Note (Signed)
NAME:  Lori [name], Lori [name]                            ACCOUNT NO.:  [id number]   MEDICAL RECORD NO.:  [id number]                   PATIENT TYPE:  OIB   LOCATION:  5504                                 FACILITY:  [facility]   PHYSICIAN:  Rosetta Posner, M.D.                 DATE OF BIRTH:  [birth year]   DATE OF PROCEDURE:  04/21/2004  DATE OF DISCHARGE:  04/22/2004                                 OPERATIVE REPORT   PREOPERATIVE DIAGNOSES:  1. End-stage renal disease with occluded left femoral loop, arteriovenous     Gore-Tex graft, status post attempted thrombolysis the 24 hours prior.  2. Probable embolus to the left dorsalis pedis artery.   POSTOPERATIVE DIAGNOSES:  1. End-stage renal disease with occluded left femoral loop, arteriovenous     Gore-Tex graft, status post attempted thrombolysis the 24 hours prior.  2. Probable embolus to the left dorsalis pedis artery.   PROCEDURE:  1. Attempted thrombectomy of left femoral loop arteriovenous Gore-Tex graft.  2. Placement of new left femoral loop arteriovenous Gore-Tex graft.  3. Intraoperative arteriogram.  4. Attempted tibial embolectomy.  5. Ultrasound of jugular veins.  6. Right subclavian Diatek catheter placement.   SURGEON:  Rosetta Posner, M.D.   ASSISTANT:  Leta Baptist, PA   ANESTHESIA:  LMA.   COMPLICATIONS:  None.   DISPOSITION:  To holding area, stable.   INDICATIONS FOR PROCEDURE:  The patient is a 40 year old white female who 24  hours prior, had undergone attempted thrombolysis by interventional  radiology.  She was found to have acute re-occlusion and presented with  complaints of her dorsum of her foot and her toes being cold and being  discolored on the evening prior to this.  She continues to have some  discomfort in her toes, although there were no color changes preoperatively.  She had easily palpable anterior tibial pulse above the ankle and no  dorsalis pedis pulse on the left.  She did have a 2+ dorsalis  pedis pulse on  the right.  Discussed this with Ms. Ruttan and she is taken to the operating  room for attempted thrombectomy and revision of her graft and also for  possible tibial embolectomy.   PROCEDURE IN DETAIL:  The patient was taken to the operating room and placed  in the supine position.  The left groin and left leg were prepped and draped  in the usual sterile fashion.  An incision was made over the femoral artery  through a prior scar and carried down to isolate the Gore-Tex graft to the  femoral vein and femoral artery.  The anastomosis was exposed bilaterally.  The arterial anastomosis was to the superficial femoral artery just distal  to the profunda femoris takeoff.  The graft was opened transversely near the  venous anastomosis.  The patient had large false aneurysms on the medial and  lateral loops of the  left femoral graft.  The graft was thrombectomized.  There were extensive, degenerative changes throughout the graft.  An attempt  was made to replaced with a new graft.  A new loop tunnel was created in the  thigh and a 6-mm Gore-Tex graft was brought through the tunnel.  The patient  was given 6000 units of intravenous heparin.  After adequate circulation  time, the superficial femoral artery was occluded proximal and distal to the  arterial anastomosis.  The arterial anastomosis was excised.  An  intraoperative arteriogram was obtained through the superficial femoral  artery with the proximal ends without occlusion.  This showed flow in the  posterior tibial, into the foot and peroneal artery down to the ankle.  The  anterior tibial artery had flow down to the distal calf and then there was  ghosting of dye with no cutoff.  It was felt that this was due to poor  flow into the distal anterior tibial artery and dorsalis pedis artery.  A #3  Fogarty catheter was positioned and passed down to the level of the foot and  two negative passes were obtained.  It was felt that  this was probably going  into the posterior tibial artery.  Further attempts were not undertaken and  it was felt that this exploration of popliteal or tibial vessel would be  much more risky in the long term for her overall runoff.  She did have a  viable foot preoperatively despite some symptoms.  The graft was brought  through the looped tunnel and was cut, slightly spatulated and sewn end-to-  side to the superficial femoral artery with a running 6-0 Prolene suture.  This anastomosis was tested, and the graft was re-occluded and the clamps  were removed with strong flow to the foot.   Next, the common femoral vein was occluded with Satinsky clamp and the old  venous anastomosis was taken down.  The graft was cut to the appropriate  length, spatulated an sewn end-to-side to the common femoral vein with a  running 6-0 Prolene suture.  Clamps were removed and excellent thrill was  noted.  The wounds were irrigated with saline and hemostasis with  electrocautery.  The wounds were closed with 2-0 Vicryl in the groin and 3-0  Vicryl in the two tunneling sites on the distal thigh.  The skin at the  groin was closed with a 4-0 subcuticular Vicryl stitch.  The patient was  given 50 mg of protamine to reverse the heparin.   Next, the right and left neck was imaged.  There was no visualization of the  right internal jugular vein; the left internal jugular vein was small but  visualized.  The patient had a prior parathyroid surgery as well.  The  patient's right and left neck just re-prepped and draped and the patient was  placed in Trendelenburg position and using a finder needle, the left  internal jugular vein was attempted to be cannulized which was not possibly.  For this reason, the left subclavian vein was entered easily and guide wire  would not pass centrally.  Contrast was injected into the left subclavian vein and there was complete, chronic central occlusion.  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  For this  reason, the right internal jugular vein was accessed easily and the guide  wire was passed down to the level of the right atrium.  Dilator and peel-  away  sheath were passed over this and a 32-cm Diatek catheter was positioned  down the level of the right atrium.  The catheter was brought through a  subcutaneous tunnel and the two lumen ports were attached.  The catheter was  flushed with heparinized saline and was locked with 1000 unit/cc heparin.  The catheter was secured to the skin with 3-0 nylon stitch.  The site was  closed with 4-0 subcuticular Vicryl stitch.  Sterile dressing was applied.  The patient was taken to the recovery room in stable condition.                                               Rosetta Posner, M.D.   TFE/MEDQ  D:  04/21/2004  T:  04/22/2004  Job:  [id number]   cc:   D. Arne Cleveland III, M.D.  [phone] [address] [place] [address]  [place]  [place] [address]  Fax: [phone]

## 2011-04-13 NOTE — Op Note (Signed)
Lori [name], Lori [name]                  ACCOUNT NO.:  [id number]   MEDICAL RECORD NO.:  [id number]          PATIENT TYPE:  INP   LOCATION:  5504                         FACILITY:  [facility]   PHYSICIAN:  Lori Regal, MD      DATE OF BIRTH:  Dec 27, 1970   DATE OF PROCEDURE:  09/27/2006  DATE OF DISCHARGE:                                 OPERATIVE REPORT   PREOPERATIVE DIAGNOSIS:  Persistent secondary hyperparathyroidism.   POSTOPERATIVE DIAGNOSIS:  Persistent secondary hyperparathyroidism.   PROCEDURE:  Neck exploration with resection of ectopic parathyroid gland.   SURGEON:  Armandina Gemma, M.D., FACS   ASSISTANT:  Marlyn Corporal, M.D., FACS   ANESTHESIA:  General.   ESTIMATED BLOOD LOSS:  Minimal.   PREPARATION:  Betadine.   COMPLICATIONS:  None.   INDICATIONS:  The patient is a 40 year old white female well known to my  surgical practice.  The patient had undergone two prior neck explorations.  She has secondary hyperparathyroidism.  Three parathyroid glands had been  identified previously and resected.  The thyroid gland had been completely  resected due to papillary thyroid carcinoma.  However, the patient continued  to have persistent evidence of secondary hyperparathyroidism.  Sestamibi  scan had been unsuccessful in localizing an ectopic gland.  MRI scan had  also been unsuccessful in localizing an ectopic parathyroid gland.  Selective venous sampling had been performed and localized the gland to the  right side of the neck or upper mediastinum.  The patient now comes to  surgery for neck exploration.   BODY OF REPORT:  Procedure was done in OR #7 at the [place]. [facility].  The patient was brought to the operating room and placed in  supine position on the operating room table.  Following administration of  general anesthesia, the patient was positioned and then prepped and draped  in the usual strict aseptic fashion.  After ascertaining that an adequate  level of anesthesia been obtained, the patient's previous Kocher incision  was reopened with a #15 blade.  Dissection was carried down into  subcutaneous tissues and hemostasis obtained with electrocautery.  Dissection was carried through scar tissue from previous surgery.  The layer  representing the platysma is divided.  Skin flaps were elevated cephalad and  caudad, and a Mahorner self-retaining retractor was placed for exposure.  Strap muscles were incised in the midline.  Dissection was carried down onto  the trachea.  The scar tissue and multiple Ligaclips were visible in the  previous operative field.  The right carotid artery was dissected out and  dissection carried distally to the level of the innominate artery.  Likewise, the left carotid was dissected out.  The tissue in the central  compartment was mobilized.  The sternal notch was completely and dissected  out and venous tributaries ligated with medium Ligaclips.  Dissection was  carried beneath the manubrium down to the level of the aortic arch.  An  occluded innominate vein was identified.  The innominate artery was  dissected out down to the level of the aortic arch.  Central compartment  tissue was palpated, and there is a firm, hard, nodular density present.  This tissue was then gently mobilized and resected using the electrocautery.  There was a large venous tributary feeding this tissue arising from the  right side of the anterior mediastinum.  It was ligated in continuity with 2-  0 silk ties and divided.  Tissue was then excised with electrocautery in its  entirety.  It was examined on the table.  It is submitted in three parts,  labeled central compartment contents, paratracheal mass, and left  paratracheal mass.  The largest nodule was quite firm and feels calcified.  An attempt was made to  dissection it on the back table.  A suture was used  to mark this lesion, and it was submitted for frozen section.  Dr.  Colman Cater examined this under frozen section and confirms parathyroid tissue  consistent with a hyperplastic parathyroid gland.   Neck was irrigated, and good hemostasis was achieved.  Surgicel was placed  in the operative field.  Strap muscles were reapproximated in the midline  with interrupted 3-0 Vicryl sutures.  Platysma layer was closed with  interrupted 3-0 Vicryl sutures.  Subcutaneous tissues were closed with  interrupted 3-0 Vicryl sutures.  Skin was closed with a running 4-0 Monocryl  subcuticular suture.  Wound was washed and dried, and Benzoin and Steri-  Strips were applied.  Sterile dressings were applied.  The patient was  awakened from anesthesia and brought to the recovery room in stable  condition.  The patient tolerated the procedure well.      Lori Regal, MD  Electronically Signed     TMG/MEDQ  D:  09/27/2006  T:  09/27/2006  Job:  [id number]   cc:   Nicanor Alcon, M.D.  Darrold Span. Florene Glen, M.D.

## 2011-04-13 NOTE — Consult Note (Signed)
NAMETAEGAN, RAPTIS                  ACCOUNT NO.:  [id number]   MEDICAL RECORD NO.:  [id number]          PATIENT TYPE:  INP   LOCATION:  5504                         FACILITY:  [facility]   PHYSICIAN:  Caren Griffins B. Lorrene Reid, M.D.DATE OF BIRTH:  30-Sep-1971   DATE OF CONSULTATION:  09/27/2006  DATE OF DISCHARGE:                                   CONSULTATION   HISTORY OF PRESENT ILLNESS:  Lori [name] is a 40 year old white female who  dialyzes Tuesday, Thursday, Saturday at the [facility].  She  has end-stage renal disease secondary to membranosum glomerulonephritis,  biopsy-proven, and had first dialysis ten years ago to this date.  The  patient is admitted today by Dr. Harlow Asa with assistance by Dr. Arlyce Dice for  resection of a lone parathyroid gland having previously had  parathyroidectomy in 2000 with persistent secondary hyperparathyroidism  since that time.  The renal physicians are consulted for medical management  of her postoperative electrolytes, dialysis, hypothyroidism, and other  thyroid issues.  The patient is seen postoperatively.  She is drowsy but  arousable.  She has no significant complaints other than sore neck.  She  does have two residual parathyroid glands that remain in the right forearm  from her original parathyroidectomy and auto transplantation in 2002.   PAST MEDICAL HISTORY:  1. As above plus hypertension status post bilateral tubal ligation in      1992.  2. History of remote tonsillectomy and adenoidectomy.  3. History of medical nonadherence to medication and volume.  4. History of morbid obesity with weight gain of 40 kg between 1997 and      2005.  5. History of multiple vascular access problems presently with a left      femoral graft.  6. History of surgery of hypothyroidism requiring lifelong hormonal      replacement after a thyroidectomy in 2002.  Also noted that her thyroid      gland at that time had a microscopic focus of papillary  carcinoma      within the left lobe.  Surgery was done by Dr. Leafy Kindle.  The patient had      a history of redo of bilateral neck exploration, right thyroid      lobectomy, and biopsy of multiple nodules but no parathyroid tissue      found.  She had the postop diagnosis of micropapillary carcinoma of the      right thyroid and Hashimoto's thyroiditis, and this was 2001.  TSH on      monthly labs October 24th was 400.  7. The patient has a history of esophageal stricture status post dilation      May 20006, Dr. Amedeo Plenty.  8. History of a large right thigh hematoma status post I&D August 2001.   CURRENT MEDICATIONS:  The patient's medications were reviewed with her, and  her pharmacy was also contacted last Monday while the physician assistant  was rounding at the dialysis center.  It was found at that time that she had  her Sensipar 90 mg refilled October 25th.  She had Prilosec  20 mg b.i.d.  filled October 6th.  Her last Synthroid dose 200 mcg per day was refilled  with #60 in July.  She has not refilled any binders or vitamins.  Her Plavix  was last filled October 2006.  She did have eardrops filled by another  physician last August.  The patient is not taking her medicines according to  her med list according to these records; although, she states she does.   SOCIAL HISTORY:  The patient lives in [place] with her husband and  daughter.  She is disabled.  She denies tobacco use, alcohol, or drug use.  Mother is decreased with a history of coronary artery disease.  Father was  on dialysis, and he is deceased.  Her sister has diabetes.   REVIEW OF SYSTEMS:  No fever, no chills, no sweats.  The patient is  gradually gaining weight.  Appetite is not a problem.  She has symptoms of  fatigue.  She denies headache, dizziness, vision changes, any new rashes or  lesions.  No shortness of breath.  No cough, no claudications.  The patient  denies symptoms of heat or cold intolerance.  The patient  still makes urine  and denies any urgency or dysuria.  The patient denies weakness, numbness,  depression, or anxiety symptoms or any particular joint pain.  She has no  nausea, vomiting , diarrhea, fever, or chills.   CURRENT DIALYSIS ORDERS:  The patient dialyzes Tuesday, Thursday, and  Saturday in [place].  Her estimated dry weight is 105 kilograms.  She  dialyzes for four hours with the left femoral graft.  Epogen dose has been  3000 units.  She is on Zemplar 20 mcg three times a week and has been on  tight heparin.   PHYSICAL EXAMINATION:  Temperature 97.9, pulse 59, respirations 22, blood  pressure 147/92.  O2 saturation is 100% on 4 liters.  GENERAL:  A 40 year old white female, obese, lying in bed, with some  discomfort in her neck area.  She is drowsy but rouses to answer questions.  HEENT:  Normocephalic, atraumatic.  EOMs intact.  Sclerae are clear.  Eyelids are very white.  She has puffiness in the face consistent with mixed  edema.  NECK:  Dressing in place.  HEART:  Bradycardic.  No murmurs, no rubs.  Heart rate and rhythm regular.  LUNGS:  Mild occasional expiratory wheezes.  SKIN:  Multiple scars from old graft and right arm parathyroidectomy scar,  no rashes or lesions.  ABDOMEN:  Obese.  Positive bowel sounds.  Soft, nontender, nondistended.  EXTREMITIES:  PA stockings intact.  Trace to 1+ lower extremity edema.  Left  femoral graft.  Patient evaluated on dialysis.  Blood flow rate 400.  Venous  pressure is okay.  NEUROLOGIC:  Affect flat.  Arousable upon exam.  Oriented x3.   LABORATORY DATA:  Hemoglobin 13, hematocrit 38.  Sodium 138, potassium 5.9,  chloride is 96, CO2 of 27, BUN 30, creatinine 7.8, glucose 112, calcium 8.8,  phosphorous 6.6, albumin 3.8.  These labs were drawn postop.   ASSESSMENT AND PLAN:  1. End-stage renal disease.  The patient is a Tuesday, Thursday, Saturday     dialysis patient in [place].  Her potassium preoperative was in the       3 to 4 range today.  It was checked twice.  It was found to be 5.9      postoperative.  She was taken for two hours of dialysis on a 1K  bath.      Due to her surgery which has a tendency to produce hyperkalemia, her      potassium will be checked q.6 h. for the next two days and then daily      or more often as needed.  She has a tendency to high volume gains.  We      will use a goal of 2 liters today and reevaluate tomorrow.  She is on      no heparin dialysis today because she is postoperative.  2. Secondary hyperparathyroidism.  The patient had resection of a lone      gland found near the aortic arch that was large and calcified.      Pathology has showed that it was parathyroid gland tissue.  She has a      baseline postoperative calcium of 8.8; she was then dialyzed on a 2.5      calcium bath today, supplemental PhosLo is started, and we will      continue on her Zemplar that she was on prior to admission.  She has      two residual glands in her right forearm, and we did not know if the      parathyroid gland that has  been removed has been suppressing the      glands in the right forearm, and we will monitor her calcium,      phosphorous, and potassium closely to see if she needs more aggressive      intervention.  As calcium decreases, we will dialyze her tomorrow on an      added calcium bath.  She will be dialyzed tomorrow anyhow because this      is her usual day and will get back on schedule.  If need be, we can      change her PhosLo to the be given between meals as well.  Dr. Harlow Asa      will follow the patient from a surgical standpoint.  3. Hypothyroidism.  The patient had surgical hypothyroidism several years      ago.  She has had problems with management due to poor adherence to      medications.  As previously mentioned, the pharmacy was called, and she      had not refilled any Synthroid since last July.  She adamantly told me      on Tuesday that she was taking  this medication, but with a TSH of 380      to 400 for two months in a row, I highly doubt it.  She has a classic      picture of mixed edema, and I suspect she has felt so bad for so long      she does not even realize this is due to her surgical hypothyroidism.      She has chronic fatigue.  She has a depressed affect.  Her voice is      chronically hoarse.  She has dry coarse skin and a dull facial      expression and periorbital puffiness and is bradycardic.  We will place      her on telemetry now for her bradycardia as well as the risks of      hypocalcemia.  I will check a baseline EKG to make sure she does not      have any aberrant cardiac rhythms, and we will start her on Synthroid      200 mcg daily while she  was in the hospital.  4. Hypertension.  In the past, she has been on blood pressure medication.      She has not been taking any.  We will control her blood pressure with     volume control.  This is managed fairly well.  In the dialysis center      however she has a tendency to high volume gains though I do not expect      that to happen in the hospital because she is postop, and it is      difficulty swallowing following this sort of surgery.  5. History of medical nonadherence to diet and medications.  It is unclear      what is the primary barrier to her following her diet and medication      prescription.  Ironically, she just had her Sensipar filled, per her      drug store, but she has not been taking any binders.  She obviously      does not need to take Sensipar at this time but will need to take      phosphate binders after discharge.  Hopefully, during this admission we      can address this problem and hopefully inspire her to do better after      discharge.  6. GERD.  The patient has been on Prilosec b.i.d.  Will continue.  7. Anemia.  The patient has been on low-dose Epogen 300 units IV at the      dialysis unit every dialysis.  Her baseline hemoglobin is 13  although I      would expect it to drop postoperatively; therefore, I am empirically      increasing her Epogen to 2000 units while she is in the hospital.  Her      iron stores on her monthly labs were within normal limits; therefore,      she does not need any supplemental iron.      Alric Seton, P.A.    ______________________________  Elzie Rings Lorrene Reid, M.D.    MB/MEDQ  D:  09/27/2006  T:  09/28/2006  Job:  [id number]   cc:   [facility]

## 2011-04-13 NOTE — Op Note (Signed)
[place]  Patient:    Lori [name]                          MRN: [id number] Proc. Date: 06/22/00 Adm. Date:  [id number] Attending:  Dorothea Glassman                           Operative Report  PREOPERATIVE DIAGNOSIS:  Thrombosed left thigh arteriovenous graft.  POSTOPERATIVE DIAGNOSIS:  Thrombosed left thigh arteriovenous graft.  PROCEDURE PERFORMED:  Thrombectomy of left thigh arteriovenous graft.  ANESTHESIA:  General.  INDICATIONS:  This is a 40 year old woman who had apparently undergone a recent thrombectomy revision of her graft.  Yesterday, she had a problem with bleeding when a catheter was removed from her opposite groin and I suspect that she was likely hypotensive during this episode.  She was noted to have a clotted graft today.  She was sent over for thrombectomy.  DESCRIPTION OF PROCEDURE:  The patient was taken to the operating room and received a general anesthetic.  The left groin was prepped and draped in the usual sterile fashion.  The previous incision was opened and the venous anastomosis was dissected free.  As this had been recently revised, there was no significant scar tissue.  The patient was then heparinized and then the graft was divided distal to the venous anastomosis.  Graft thrombectomy was achieved using a #4 Fogarty catheter and excellent inflow established.  The graft was flushed with heparinized saline and clamped.  Venous thrombectomy was then performed.  I clamped the vein proximal and distal to the anastomosis so I could directly inspect the anastomosis and this was widely patent without any evidence of irregularity.  The graft was then sewn back end-to-end using continuous 6-0 Prolene suture.  At the completion, there was a good thrill in the graft.  Hemostasis was obtained in the wound.  The wound was closed with a deep layer of 3-0 Vicryl, the subcutaneous layer of 3-0 Vicryl and the  skin closed with staples.  A sterile dressing was applied.  The patient tolerated the procedure well and was transferred to the recovery room in satisfactory condition.  All needle and sponge counts were correct. DD:  06/22/00 TD:  06/24/00 Job: [id number]

## 2011-04-13 NOTE — H&P (Signed)
[place]  Patient:    Lori [name], Lori [name]                         MRN: [id number] Adm. Date:  [id number] Attending:  Manuella Ghazi Dictator:   Ula Lingo CC:         [facility]   History and Physical  CHIEF COMPLAINT:  Right thigh hematoma.  HISTORY OF PRESENT ILLNESS:  This is a 40 year old white female with end-stage renal disease who had a dialysis catheter removed from the right thigh on June 17, 2000 and subsequently has developed a large hematoma.  She was seen and evaluated by Dr. Donnetta Hutching, who felt she would require admission for an incision and drainage of the hematoma with debridement and local wound care postoperatively.  She was admitted this hospitalization for the procedure.  PAST MEDICAL AND SURGICAL HISTORY: 1. Hypertension. 2. End-stage renal disease secondary to glomerulonephritis since November    1997. 3. History of hyperparathyroidism, status post parathyroidectomy x 2.  Also    has an implant right arm. 4. Total thyroidectomy for thyroid cancer. 5. History of multiple vascular access procedures.  Her current access is her    left thigh femoral AV Gore-Tex graft. 6. Status post tubal ligation.  ALLERGIES:  COUMADIN.  MEDICATIONS: 1. Iron sulfate 325 mg q.d. with meals. 2. Normodyne 200 mg b.i.d. 3. Norvasc 10 mg q.d. 4. Enalapril 10 mg q.h.s. 5. Synthroid 0.025 mg q.d. 6. Os-Cal 2500 mg t.i.d. with meals. 7. Nephro-Vite one q.d.  FAMILY HISTORY:  Remarkable for father on hemodialysis.  Sister with diabetes mellitus.  Mother with coronary artery disease.  SOCIAL HISTORY:  Negative for alcohol or tobacco use.  REVIEW OF SYMPTOMS:  Remarkable for shortness of breath, previous history of CVA and TIA, history of GERD symptoms, gastroesophageal reflux disease, history of irregular menses, and sleep difficulties.  PHYSICAL EXAMINATION:  VITAL SIGNS:  Temperature 98.3, heart rate 60,  respirations 18, blood pressure 152/91.  GENERAL APPEARANCE: This is a moderately obese female who appears chronically ill.  She is in no acute distress.  HEENT:  Pharynx is clear.  She has a well-healed neck thyroid surgical scar. She has no adenopathy, no jugular venous distention, no bruits.  CARDIAC:  Regular rate and rhythm, S1, S2 normal without murmur, gallop, or rub.  ABDOMEN:  No palpable hepatosplenomegaly, soft, nontender, normoactive bowel sounds, no masses.  NEUROLOGIC:  She is lethargic post surgery.  EXTREMITIES:  No cyanosis, clubbing, or edema.  GENITOURINARY AND RECTAL:  Deferred.  VASCULAR:  Pulses are equal and intact bilateral.  The left thigh dressing is clean, dry, and intact.  The left AV Gore-Tex graft has a palpable thrill and auditory bruit.  ASSESSMENT:  Large right thigh hematoma, status post previous dialysis access catheter.  Other diagnoses per history, including hypertension, end-stage renal disease, hyperparathyroidism, total thyroidectomy for carcinoma, multiple vascular access procedures, status post tubal ligation, history of CVA in 1996, history of TIA in 1974, gastroesophageal reflux disease, and irregular menses.  PLAN:  Incision and drainage, right thigh with local wound care by Dr. Donnetta Hutching. DD:  07/05/00 TD:  07/05/00 Job: [id number]

## 2011-04-13 NOTE — Op Note (Signed)
[facility]  Patient:    Lori [name], Lori [name]                         MRN: [id number] Proc. Date: 06/14/00 Adm. Date:  [id number] Attending:  Allyn Kenner CC:         Dr. Cleda Daub, [place], [place]             Windy Kalata, M.D.                           Operative Report  CCS#:  [id number]  PREOPERATIVE DIAGNOSES: 1. Persistent secondary hyperparathyroidism status post resection of 3    hyperplastic parathyroid glands, bilateral neck exploration and left    thyroid lobectomy. 2. History of carcinoma of the left lobe of the thyroid.  PREOPERATIVE DIAGNOSES: 1. Persistent secondary hyperparathyroidism status post resection of 3    hyperplastic parathyroid glands, bilateral neck exploration and left    thyroid lobectomy. 2. History of carcinoma of the left lobe of the thyroid. 3. Hashimoto thyroiditis of right lobe of thyroid.  OPERATION PERFORMED:  Redo bilateral neck exploration, right thyroid lobectomy and biopsy of multiple nodules, no parathyroid tissue was found.  SURGEON:  Dr. Leafy Kindle.  SURGEON:  Dr. Lennie Hummer and Dr. Aaron Edelman.  ANESTHESIA:  General endotracheal.  HISTORY OF PRESENT ILLNESS:  This 40 year old female with end-stage renal failure and secondary hyperparathyroidism was operated on by me on February 21, 1999 for secondary hyperparathyroidism. At that time, bilateral neck exploration was carried out and resection of 3 hyperplastic parathyroid glands, the left inferior, the right inferior and the right superior. The left lobe of the thyroid gland was resected also looking for intrathyroidal parathyroid gland but none was found. She was found to have carcinoma of the papillary carcinoma of the left lobe of the thyroid, however.  The patient has had persistent secondary hyperparathyroidism and ______  scan that was done earlier this year was positive for what appeared to be hyperplastic parathyroid tissue on the right  side of her neck either within or about the right lobe of the thyroid. She was therefore brought to the operating room today with radionuclear guidance having been injected with ______  agent approximately 2 hours prior to the procedure, for bilateral exploration of the neck and resection of the right lobe of the thyroid as well as the persistent hyperplastic parathyroid tissue.  DESCRIPTION OF PROCEDURE:  Under adequate general endotracheal anesthesia, the patients neck was extended on a shoulder roll. The neck was then prepared and draped in the usual fashion. She had a low collar incisional scar present and the incision was made through the previous scar and carried down through the subcutaneous tissue. There did not appear to be any significant platysma muscle present. The middle cervical fascia was exposed and superior and inferior skin flaps were developed just above the middle cervical fascia.  Next, the middle cervical fascia was divided longitudinally in the midline with Bovie electrocoagulation after inserting a [place] self retaining retractor. The strap muscles were totally intact even though they had been divided at the last operation it was impossible to find any scarring of the strap muscles whatsoever. The right sided strap muscles were divided using Bovie electrocoagulation and the surgical plane of the thyroid was entered. There was a dense layer of scar tissue surrounding the right lobe of the thyroid which was markedly enlarged and multinodular. The  scar tissue was divided using Bovie electrocoagulation and the right lobe of the thyroid was exposed at its capsule.  By blunt and sharp dissection, the lobe was freed up from the surrounding tissue. Radionuclear mapping of the neck revealed a hot spot within the thyroid lobe that was around 1000 counts per second as opposed to the other areas on the right side of the neck that were in the vicinity of the 700 counts. The  counts on the left side of the neck were 300-350.  The superior pole of the thyroid gland was freed up and the sternothyroid muscles were dissected off of it. The superior pole vessels were divided over triple hemoclip technique. Two sets of clips were used and this mobilized the superior pole of the thyroid very well. The superior pole was dissected off of the trachea and the right recurrent laryngeal nerve was identified and the dissection was kept anterior to it. The inferior pole vessels were divided over hemoclips also and a good deal of Bovie electrocoagulation was used on the specimen side. The lobe was tediously dissected off the trachea and completely excised taking care not to injure the right recurrent laryngeal nerve. The specimen was removed from the operative field and sent for pathologic study. On first glance, there was no evidence of any intrathyroidal or parathyroid tissue. The thyroid did have evidence of Hashimoto thyroiditis, however.  There were other nodular structures at the inferior pole region of the thyroid and these were dissected out and sent for frozen section analysis. Specimen #2 was quite sizable and was found to be lymphatic tissue and specimen #3 was similar.  We saw no other evidence of any parathyroid glands in this tissue. We then explored the left side of the neck. The left lobe of the thyroid was surgically absent and there was a good deal of scar tissue on the left side. We exposed the esophagus and the carotid sheath. We were unable to locate any other parathyroid tissue on the left side as well. The left superior gland was the gland that was missing on the first operation and we concentrated on finding one in this area but none was found. We also explored the posterior superior mediastinum and felt no lesions back there. Having thoroughly explored both sides, we made sure that hemostasis was intact. The right sided strap muscles were repaired  with interrupted horizontal mattress sutures of 4-0 Vicryl. They were then reapproximated in the midline with the same suture.  The subcutaneous tissue of the neck was then also repaired with interrupted horizontal mattress sutures of 4-0 Vicryl and the skin was approximated with a skin stapler 35-W. A sterile dressing was applied. Estimated blood loss for the procedure was approximately 150 cc. The patient tolerated the procedure well and left the operating room in satisfactory condition. DD:  06/14/00 TD:  06/18/00 Job: [id number]

## 2011-04-13 NOTE — Op Note (Signed)
. [facility]  Patient:    Lori [name], Lori [name]                         MRN: [id number] Proc. Date: 06/16/00 Adm. Date:  [id number] Attending:  Allyn Kenner                           Operative Report  PREOPERATIVE DIAGNOSIS: 1. End-stage renal failure. 2. Thrombosis left thigh arteriovenous Gore-Tex graft.  POSTOPERATIVE DIAGNOSIS: 1. End-stage renal failure 2. Thrombosis left thigh arteriovenous Gore-Tex graft.  OPERATION PERFORMED:  Thrombectomy and revision, left thigh arteriovenous Gore-Tex graft.  SURGEON:  Gordy Clement, M.D.  ASSISTANT:  Lestine Box  ANESTHESIA:  General endotracheal.  ANESTHESIOLOGIST:  Dr. Linna Caprice.  DESCRIPTION OF PROCEDURE:  Patient brought to the operating room in stable condition.  Placed in supine position.  General endotracheal anesthesia induced.  Left leg prepped and draped in sterile fashion.  Oblique skin incision made through the scar in the left groin.  Dissection carried down to expose the venous limb of the graft.  This was followed down to an end-to-side anastomosis to the saphenofemoral junction.  The common femoral vein mobilized proximal to this. A partial occlusion clamp was placed across the common femoral vein and the venous anastomosis taken down.  The venotomy extended out on the common femoral vein.  The graft thrombectomized with a 5 and 6 Fogarty catheter.  Excellent inflow obtained. The graft filled with heparin saline solution.  A new segment of 6 mm Gore-Tex was then anastomosed end-to-side to the common femoral vein using running 6-0 Prolene suture.  This was then anastomosed end-to-end to the divided graft using running 6-0 Prolene suture.  Clamps were then removed.  Excellent flow present.  Adequate hemostasis obtained.  Sponge and instrument counts were correct.  Subcutaneous tissues closed in two layers, deep layer of running 2-0 Vicryl suture, superficial layer of  running 3-0 suture, staples applied to the skin. Sterile dressing was applied.  The patient tolerated the procedure well.  Transferred to recovery room in stable condition. DD:  06/16/00 TD:  06/17/00 Job: [id number]

## 2011-04-13 NOTE — Discharge Summary (Signed)
[place]  Patient:    Lori [name], Lori [name]                         MRN: [id number] Adm. Date:  [id number] Disc. Date: [id number] Attending:  Manuella Ghazi Dictator:   Claudette T. Crell, C.R.N.F.A CC:         Dr. Lorrene Reid at [facility]   Discharge Summary  DATE OF BIRTH:  1971-10-20  ADMITTING DIAGNOSIS:  Hematoma of right thigh with large nonviable skin eschar.  PAST MEDICAL HISTORY: 1. End-stage renal disease due to glomerulonephritis, November 1997. 2. Hypertension. 3. History of hyperparathyroidism status post parathyroidectomy/implant right    arm. 4. Total thyroidectomy for thyroid carcinoma. 5. Multiple vascular access procedures.  Current hemodialysis access left    thigh AV Gore-Tex graft.  DISCHARGE DIAGNOSES: 1. Right thigh hematoma status post debridement and evacuation on August 10th. 2. Hypertension/blood pressure medications have been adjusted.  ALLERGIES:  The patient is allergic to COUMADIN.  HOSPITAL COURSE AND PROCEDURES:  This is a 40 year old white female with end-stage renal disease who had a dialysis access catheter removed from the right thigh on June 17, 2000 and subsequently has developed a large hematoma. She was seen and evaluated by Dr. Donnetta Hutching who felt she would require admission for an incision and drainage of hematoma, debridement and local wound postoperatively. On July 05, 2000 she was [facility] under Dr. Donnetta Hutching and underwent an uncomplicated debridement of the right thigh and evacuation of a large hematoma of the right thigh under general anesthesia.  At the conclusion of the procedure, she was transferred in stable condition to the PACU.  Her postoperative course has been notable for asymptomatic bradycardia, and some hypotension.  Her medications have been adjusted accordingly.  While she has been in the hospital she has been  on inpatient hemodialysis.  She has progressed well from her surgery and Dr. Donnetta Hutching anticipates discharge for tomorrow August 14th with home health nursing to do daily dressing changes to her right thigh.  DISCHARGE MEDICATIONS: 1. Tylox 1-2 p.o. q.4-6h. for pain. 2. Iron sulfate 325 mg with meals. 3. Normodyne 100 mg b.i.d. 4. Enalapril 10 mg q.h.s. 5. Synthroid 0.025 q.d. 6. Os-Cal 2500 mg t.i.d. with meals. 7. Nephro-Vite q.d. 8. Keflex 500 mg t.i.d. x 7 days.  FOLLOW UP:  [facility] will provide a home health nurse for daily dressing changes to the right thigh.  Dr. Donnetta Hutching would like to see Ms. [name] in his office in two weeks.  The office will call with an exact date and time. DD:  07/08/00 TD:  07/09/00 Job: [id number]

## 2011-04-13 NOTE — Op Note (Signed)
Lori [name], Lori [name]                  ACCOUNT NO.:  [id number]   MEDICAL RECORD NO.:  [id number]          PATIENT TYPE:  AMB   LOCATION:  ENDO                         FACILITY:  [facility]   PHYSICIAN:  John C. Amedeo Plenty, M.D.    DATE OF BIRTH:  11-20-1971   DATE OF PROCEDURE:  04/04/2005  DATE OF DISCHARGE:                                 OPERATIVE REPORT   PROCEDURE:  Procto gastroduodenoscopy with esophageal dilatation.   INDICATIONS FOR PROCEDURE:  Dysphagia and dyspepsia, with esophagitis seen  on upper GI series. They were not able to entirely exclude esophageal  varices. The patient does have elevated alkaline phosphatase.   PROCEDURE:  The patient was placed in the left lateral decubitus position  and placed on the pulse monitor, with continuous low-flow oxygen delivered  by nasal cannula. She was sedated with 75 mcg IV fentanyl and 6 mg IV  Versed. The Olympus video endoscope was advanced under direct vision into  the oropharynx and esophagus.  The esophagus was straight and of normal  caliber, with squamocolumnar line imbedded in a fairly patent lower  esophageal ring at 38 cm.  There were two or three small erosions right at  the ring; there was no resistance to passage of the scope beyond it.  The  stomach was entered and a small amount of  liquid secretions suctioned from  the fundus.  Direct view of the cardia was unremarkable.  The fundus body,  antrum and pylorus all appeared normal. The duodenum was entered, and both  bulb and second portion were inspected and appeared to be within normal  limits. Savary guidewire was placed through the endoscope channel; scope  withdrawn.  A single pass with Savary dilator passed over the guidewire with  no resistance and blood seen on withdrawal.  The dilator was removed  together with the wire, and the patient returned to the recovery room in  stable condition. She tolerated the procedure well. There were no immediate  complications.   IMPRESSION:  Lower esophageal ring with mild esophagitis, status post  dilatation.   PLAN:  Continue with upper abdominal followup in the office in three weeks  to have her liver function tests rechecked, and possibly for workup      JCH/MEDQ  D:  04/04/2005  T:  04/04/2005  Job:  [id number]   cc:   Sherril Croon, M.D.  [place]  [place] [address]  Fax: [phone]

## 2011-04-13 NOTE — Discharge Summary (Signed)
[place]  Patient:    Lori [name], Lori [name]                         MRN: [id number] Adm. Date:  [id number] Disc. Date: [id number] Attending:  Manuella Ghazi CC:         Dr. Cleda Daub, [place]                          Windy Kalata, M.D.                           Discharge Summary  CCS# [id number]  FINAL DIAGNOSES:  1. Persistent secondary hyperparathyroidism, status post resection of three     hyperplastic parathyroid glands, bilateral neck exploration on two     occasions and total thyroidectomy, ____________ carcinoma of left lobe of     thyroid, micropapillary carcinoma of right lobe thyroid, Hashimotos     thyroiditis.  2. End-stage renal failure, status hemodialysis.  3. History of hypertension.  4. History of mild left ventricular dysfunction.  5. Thrombosis of left thigh arteriovenous graft--acute.  6. Hoarseness--postoperative complication.  HISTORY:  This unfortunate 40 year old female with end-stage renal failure and secondary hyperparathyroidism had undergone a previous exploration of her neck and resection of left lobe of her thyroid gland on February 20, 1999.  Only three parathyroid glands were found and removed.  The left lobe of her thyroid was removed expecting to find an intrathyroidal parathyriod gland but none was found.  She was found to have a microscopic focus of papillary carcinoma within the left lobe of the thyroid.  Despite that surgery, she continued to have an elevated parathyroid hormone running between 1000 and 2000 units.  A sestamibi scan done on January 02, 2000 suggested increased activity on the right side of her neck and particularly in the right inferior thyroid region.  PAST MEDICAL HISTORY:  End-stage renal failure secondary to chronic glomerulonephritis, status post renal biopsy in June of 1989.  Also history of hypertension, bilateral tubal ligation in 1992, previous tonsillectomy  and adenoidectomy, left forearm Gore-Tex graft in 1997, left upper arm Gore-Tex graft in 1998.  Right forearm Gore-Tex graft which became infected and required removal in 1999, right upper arm Gore-Tex graft which required removal due to exposure through the skin, failed peritoneal dialysis in 2000, a left thigh graft by Dr. Gae Gallop in 2000, status post subtotal parathyroidectomy with reimplantation of the right parathyroid tissue to the right forearma as noted above.  PHYSICAL EXAMINATION:  Physical examination revealed a low collar incisional scar which was well-healed.  The remainder of the exam was unremarkable except for the extremities where there were multiple scars of the upper and lower extremities from previous failed Gore-Tex graft procedures.  Her active access was in the left thigh at this time and there was a well-healed scar longitudinally over the right brachioradialis muscle of previous autotransplantation of parathyroid tissue.  HOSPITAL COURSE:  On the day of admission the patient received an injection of technetium  sestamibi agent approximately two hours prior to surgery.  She then underwent redo bilateral neck exploration, right thyroid lobectomy and biopsy of multiple nodules but no parathyroid tissue was found.  The right lobe of the thyroid did contain some micropapillary carcinoma and Hashimotos thyroiditis.  Her postoperative course was complicated by hoarseness and difficulty in swallowing liquids.  She also  thrombosis of her thigh arteriovenous graft. On the evening of surgery, the patient had placement of a femoral hemodialysis catheter and emergency hemodialysis.  The left thigh venous access was declotted on June 16, 2000 by Dr. Amedeo Plenty.  The patient was seen by Speech Pathology and thick fluids were recommended. The patient tolerated it well and went on to tolerate solid food.  Her skin staples were removed on June 17, 2000 and Steri-Strips applied.   She had hemodialysis on the appropriate days including July 22 and June 18, 2000.  On July 24, she was still hoarse but swallowing much better and wanted very much to go home.  She was tolerating a solid diet and she was discharged for further care as an outpatient.  The patients condition on discharge is satisfactory.  Diet was to be 80 gm protein, 2 gm sodium, 2 gm potassium, limited to five cups of fluid per day.  She was to resume hemodialysis in [place], [place].  DISCHARGE MEDICATIONS: 1. Nephrovite 1 tablet daily. 2. Calcium 500 mg 5 tablets 3 times a day with meals. 3. Synthroid 0.25 mg daily. 4. Labetalol 200 mg b.i.d. 5. Enalapril 10 mg 2 tablets h.s. 6. Tylox 1 tablet every 4 to 6 hours p.r.n. for pain.  She was to return to our office in two weeks for further follow-up. DD:  07/23/00 TD:  07/24/00 Job: [id number]

## 2011-08-08 ENCOUNTER — Ambulatory Visit: Payer: Medicare Other | Admitting: Vascular Surgery

## 2011-08-08 DIAGNOSIS — I10 Essential (primary) hypertension: Secondary | ICD-10-CM

## 2011-08-20 LAB — RENAL FUNCTION PANEL
Albumin: 3.2 — ABNORMAL LOW
BUN: 83 — ABNORMAL HIGH
CO2: 24
Calcium: 7.8 — ABNORMAL LOW
Chloride: 96
Creatinine, Ser: 12.56 — ABNORMAL HIGH
GFR calc Af Amer: 4 — ABNORMAL LOW
GFR calc non Af Amer: 3 — ABNORMAL LOW
Glucose, Bld: 82
Phosphorus: 7.8 — ABNORMAL HIGH
Potassium: 7
Sodium: 135

## 2011-08-20 LAB — CBC
HCT: 28.3 — ABNORMAL LOW
Hemoglobin: 9.5 — ABNORMAL LOW
MCHC: 33.7
MCV: 86.8
Platelets: 268
RBC: 3.26 — ABNORMAL LOW
RDW: 15
WBC: 9.6

## 2011-08-20 LAB — POTASSIUM: Potassium: 6.7

## 2011-08-29 LAB — RENAL FUNCTION PANEL
Albumin: 3.8
BUN: 42 — ABNORMAL HIGH
CO2: 25
Calcium: 9.2
Chloride: 97
Creatinine, Ser: 10.14 — ABNORMAL HIGH
GFR calc Af Amer: 5 — ABNORMAL LOW
GFR calc non Af Amer: 4 — ABNORMAL LOW
Glucose, Bld: 144 — ABNORMAL HIGH
Phosphorus: 7.2 — ABNORMAL HIGH
Potassium: 4.2
Sodium: 137

## 2011-08-29 LAB — POCT I-STAT 4, (NA,K, GLUC, HGB,HCT)
Glucose, Bld: 114 — ABNORMAL HIGH
HCT: 43
Hemoglobin: 14.6
Potassium: 4.1
Sodium: 133 — ABNORMAL LOW

## 2011-08-29 LAB — ANAEROBIC CULTURE: Gram Stain: NONE SEEN

## 2011-08-29 LAB — CBC
HCT: 32.4 — ABNORMAL LOW
HCT: 35.8 — ABNORMAL LOW
Hemoglobin: 10.9 — ABNORMAL LOW
Hemoglobin: 12.1
MCHC: 33.6
MCHC: 33.9
MCV: 95.5
MCV: 95.7
Platelets: 119 — ABNORMAL LOW
Platelets: 127 — ABNORMAL LOW
RBC: 3.39 — ABNORMAL LOW
RBC: 3.75 — ABNORMAL LOW
RDW: 16 — ABNORMAL HIGH
RDW: 16.1 — ABNORMAL HIGH
WBC: 13.9 — ABNORMAL HIGH
WBC: 9.8

## 2011-08-29 LAB — WOUND CULTURE: Gram Stain: NONE SEEN

## 2011-08-29 LAB — DIFFERENTIAL
Basophils Absolute: 0
Basophils Relative: 0
Eosinophils Absolute: 0.2
Eosinophils Relative: 2
Lymphocytes Relative: 9 — ABNORMAL LOW
Lymphs Abs: 0.9
Monocytes Absolute: 1.5 — ABNORMAL HIGH
Monocytes Relative: 15 — ABNORMAL HIGH
Neutro Abs: 7.2
Neutrophils Relative %: 74

## 2011-08-29 LAB — VANCOMYCIN, RANDOM: Vancomycin Rm: 9.1

## 2011-08-30 ENCOUNTER — Ambulatory Visit: Payer: Self-pay | Admitting: Cardiology

## 2011-09-04 LAB — POTASSIUM: Potassium: 4.6

## 2011-09-07 ENCOUNTER — Ambulatory Visit: Payer: Medicare Other | Admitting: Vascular Surgery

## 2011-09-10 LAB — PATHOLOGY REPORT

## 2011-09-11 LAB — POCT I-STAT 4, (NA,K, GLUC, HGB,HCT)
Glucose, Bld: 127 — ABNORMAL HIGH
HCT: 36
Hemoglobin: 12.2
Operator id: 253151
Potassium: 3.1 — ABNORMAL LOW
Sodium: 138

## 2011-09-11 LAB — POTASSIUM: Potassium: 6.5

## 2011-11-08 ENCOUNTER — Encounter: Payer: Self-pay | Admitting: Cardiology

## 2011-11-09 ENCOUNTER — Ambulatory Visit: Payer: Medicare Other | Admitting: Vascular Surgery

## 2011-11-14 ENCOUNTER — Ambulatory Visit: Payer: Medicare Other | Admitting: Vascular Surgery

## 2011-11-16 ENCOUNTER — Ambulatory Visit: Payer: Medicare Other | Admitting: Vascular Surgery

## 2012-01-14 ENCOUNTER — Ambulatory Visit: Payer: Self-pay | Admitting: Vascular Surgery

## 2012-08-11 ENCOUNTER — Encounter (HOSPITAL_COMMUNITY): Payer: Self-pay | Admitting: *Deleted

## 2012-08-11 ENCOUNTER — Inpatient Hospital Stay (HOSPITAL_COMMUNITY)
Admission: EM | Admit: 2012-08-11 | Discharge: 2012-08-12 | DRG: 314 | Disposition: A | Discharge status: Other Acute Inpatient Hospital | Payer: Medicare Other | Attending: Internal Medicine | Admitting: Internal Medicine

## 2012-08-11 DIAGNOSIS — Y841 Kidney dialysis as the cause of abnormal reaction of the patient, or of later complication, without mention of misadventure at the time of the procedure: Secondary | ICD-10-CM | POA: Diagnosis present

## 2012-08-11 DIAGNOSIS — D631 Anemia in chronic kidney disease: Secondary | ICD-10-CM | POA: Diagnosis present

## 2012-08-11 DIAGNOSIS — R002 Palpitations: Secondary | ICD-10-CM

## 2012-08-11 DIAGNOSIS — E063 Autoimmune thyroiditis: Secondary | ICD-10-CM | POA: Diagnosis present

## 2012-08-11 DIAGNOSIS — L0291 Cutaneous abscess, unspecified: Secondary | ICD-10-CM

## 2012-08-11 DIAGNOSIS — I120 Hypertensive chronic kidney disease with stage 5 chronic kidney disease or end stage renal disease: Secondary | ICD-10-CM | POA: Diagnosis present

## 2012-08-11 DIAGNOSIS — Z9119 Patient's noncompliance with other medical treatment and regimen: Secondary | ICD-10-CM

## 2012-08-11 DIAGNOSIS — Z862 Personal history of diseases of the blood and blood-forming organs and certain disorders involving the immune mechanism: Secondary | ICD-10-CM

## 2012-08-11 DIAGNOSIS — L0390 Cellulitis, unspecified: Secondary | ICD-10-CM

## 2012-08-11 DIAGNOSIS — Z6837 Body mass index (BMI) 37.0-37.9, adult: Secondary | ICD-10-CM

## 2012-08-11 DIAGNOSIS — I2510 Atherosclerotic heart disease of native coronary artery without angina pectoris: Secondary | ICD-10-CM

## 2012-08-11 DIAGNOSIS — R42 Dizziness and giddiness: Secondary | ICD-10-CM

## 2012-08-11 DIAGNOSIS — T82898A Other specified complication of vascular prosthetic devices, implants and grafts, initial encounter: Principal | ICD-10-CM | POA: Diagnosis present

## 2012-08-11 DIAGNOSIS — N186 End stage renal disease: Secondary | ICD-10-CM

## 2012-08-11 DIAGNOSIS — E875 Hyperkalemia: Secondary | ICD-10-CM | POA: Diagnosis present

## 2012-08-11 DIAGNOSIS — E669 Obesity, unspecified: Secondary | ICD-10-CM

## 2012-08-11 DIAGNOSIS — N039 Chronic nephritic syndrome with unspecified morphologic changes: Secondary | ICD-10-CM | POA: Diagnosis present

## 2012-08-11 DIAGNOSIS — E213 Hyperparathyroidism, unspecified: Secondary | ICD-10-CM | POA: Diagnosis present

## 2012-08-11 DIAGNOSIS — K219 Gastro-esophageal reflux disease without esophagitis: Secondary | ICD-10-CM

## 2012-08-11 DIAGNOSIS — Z87891 Personal history of nicotine dependence: Secondary | ICD-10-CM

## 2012-08-11 DIAGNOSIS — D638 Anemia in other chronic diseases classified elsewhere: Secondary | ICD-10-CM

## 2012-08-11 DIAGNOSIS — Z79899 Other long term (current) drug therapy: Secondary | ICD-10-CM

## 2012-08-11 DIAGNOSIS — R55 Syncope and collapse: Secondary | ICD-10-CM

## 2012-08-11 DIAGNOSIS — Z91199 Patient's noncompliance with other medical treatment and regimen due to unspecified reason: Secondary | ICD-10-CM

## 2012-08-11 DIAGNOSIS — L03119 Cellulitis of unspecified part of limb: Secondary | ICD-10-CM | POA: Diagnosis present

## 2012-08-11 DIAGNOSIS — L02419 Cutaneous abscess of limb, unspecified: Secondary | ICD-10-CM | POA: Diagnosis present

## 2012-08-11 DIAGNOSIS — Z992 Dependence on renal dialysis: Secondary | ICD-10-CM

## 2012-08-11 DIAGNOSIS — E0789 Other specified disorders of thyroid: Secondary | ICD-10-CM | POA: Diagnosis present

## 2012-08-11 HISTORY — DX: Dependence on renal dialysis: Z99.2

## 2012-08-11 LAB — BASIC METABOLIC PANEL
BUN: 65 mg/dL — ABNORMAL HIGH (ref 6–23)
CO2: 25 mEq/L (ref 19–32)
Calcium: 8.8 mg/dL (ref 8.4–10.5)
Chloride: 84 mEq/L — ABNORMAL LOW (ref 96–112)
Creatinine, Ser: 12.09 mg/dL — ABNORMAL HIGH (ref 0.50–1.10)
GFR calc Af Amer: 4 mL/min — ABNORMAL LOW (ref >90)
GFR calc non Af Amer: 3 mL/min — ABNORMAL LOW (ref >90)
Glucose, Bld: 67 mg/dL — ABNORMAL LOW (ref 70–99)
Potassium: 6.1 mEq/L — ABNORMAL HIGH (ref 3.5–5.1)
Sodium: 130 mEq/L — ABNORMAL LOW (ref 135–145)

## 2012-08-11 LAB — CBC WITH DIFFERENTIAL/PLATELET
Basophils Absolute: 0.1 10*3/uL (ref 0.0–0.1)
Basophils Relative: 1 % (ref 0–1)
Eosinophils Absolute: 0.2 10*3/uL (ref 0.0–0.7)
Eosinophils Relative: 2 % (ref 0–5)
HCT: 42.5 % (ref 36.0–46.0)
Hemoglobin: 14.3 g/dL (ref 12.0–15.0)
Lymphocytes Relative: 11 % — ABNORMAL LOW (ref 12–46)
Lymphs Abs: 1 10*3/uL (ref 0.7–4.0)
MCH: 30.4 pg (ref 26.0–34.0)
MCHC: 33.6 g/dL (ref 30.0–36.0)
MCV: 90.2 fL (ref 78.0–100.0)
Monocytes Absolute: 1.2 10*3/uL — ABNORMAL HIGH (ref 0.1–1.0)
Monocytes Relative: 13 % — ABNORMAL HIGH (ref 3–12)
Neutro Abs: 6.7 10*3/uL (ref 1.7–7.7)
Neutrophils Relative %: 73 % (ref 43–77)
Platelets: 112 10*3/uL — ABNORMAL LOW (ref 150–400)
RBC: 4.71 MIL/uL (ref 3.87–5.11)
RDW: 14.2 % (ref 11.5–15.5)
WBC: 9 10*3/uL (ref 4.0–10.5)

## 2012-08-11 MED ORDER — LOSARTAN POTASSIUM 50 MG PO TABS
50.0000 mg | ORAL_TABLET | Freq: Every day | ORAL | Status: DC
  Administered 2012-08-12: 50 mg via ORAL
  Filled 2012-08-11: qty 1

## 2012-08-11 MED ORDER — SODIUM CHLORIDE 0.9 % IV BOLUS (SEPSIS)
500.0000 mL | Freq: Once | INTRAVENOUS | Status: AC
  Administered 2012-08-11: 20:00:00 via INTRAVENOUS

## 2012-08-11 MED ORDER — SODIUM CHLORIDE 0.9 % IV SOLN
250.0000 mL | INTRAVENOUS | Status: DC | PRN
  Administered 2012-08-12: 250 mL via INTRAVENOUS

## 2012-08-11 MED ORDER — SODIUM CHLORIDE 0.9 % IJ SOLN
3.0000 mL | Freq: Two times a day (BID) | INTRAMUSCULAR | Status: DC
  Administered 2012-08-12 (×2): 3 mL via INTRAVENOUS
  Filled 2012-08-11 (×2): qty 3

## 2012-08-11 MED ORDER — LIOTHYRONINE SODIUM 25 MCG PO TABS
25.0000 ug | ORAL_TABLET | Freq: Every day | ORAL | Status: DC
  Administered 2012-08-12: 25 ug via ORAL
  Filled 2012-08-11 (×2): qty 1

## 2012-08-11 MED ORDER — SODIUM CHLORIDE 0.9 % IJ SOLN
3.0000 mL | INTRAMUSCULAR | Status: DC | PRN
  Filled 2012-08-11: qty 3

## 2012-08-11 MED ORDER — VANCOMYCIN HCL IN DEXTROSE 1-5 GM/200ML-% IV SOLN
1000.0000 mg | Freq: Once | INTRAVENOUS | Status: AC
  Administered 2012-08-11: 1000 mg via INTRAVENOUS
  Filled 2012-08-11: qty 200

## 2012-08-11 MED ORDER — LEVOTHYROXINE SODIUM 100 MCG PO TABS: 300.0000 ug | ORAL_TABLET | Freq: Every day | ORAL | Status: DC

## 2012-08-11 MED ORDER — SODIUM CHLORIDE 0.9 % IJ SOLN
3.0000 mL | Freq: Two times a day (BID) | INTRAMUSCULAR | Status: DC
  Administered 2012-08-12: 3 mL via INTRAVENOUS

## 2012-08-11 MED ORDER — LEVOTHYROXINE SODIUM 100 MCG PO TABS
300.0000 ug | ORAL_TABLET | Freq: Every day | ORAL | Status: DC
Start: 2012-08-12 — End: 2012-08-12
  Administered 2012-08-12: 300 ug via ORAL
  Filled 2012-08-11: qty 3

## 2012-08-11 NOTE — ED Provider Notes (Signed)
History    This chart was scribed for [employer]. Alvino Chapel, MD, MD by Rhae Lerner. The patient was seen in room APA17 and the patient's care was started at 6:54PM.   CSN: [id number]  Arrival date & time 08/11/12  1639       Chief Complaint  Patient presents with  . Insect Bite    (Consider location/radiation/quality/duration/timing/severity/associated sxs/prior treatment) The history is provided by the patient. No language interpreter was used.   Lori [name] is a 41 y.o. female who presents to the Emergency Department complaining of possible spider bite onset today on right thigh. Pt reports that she was bitten near her dialysis graft site. She states that she has seen multiple spiders in her house. She reports that she was last dialyzed ([address]) Saturday. Pt reports having fever 2 days ago after having abx (vancomyocin and another she can not remember). Denies any other pain.   Past Medical History  Diagnosis Date  . ASCVD (arteriosclerotic cardiovascular disease)   . Hypertension   . Anemia   . Hyperparathyroidism   . Hashimoto thyroiditis   . GERD (gastroesophageal reflux disease)   . Morbid obesity   . Medically noncompliant   . ESRD (end stage renal disease)     Due to membranous GN dialysis 09/1996; peritoneal dialysis --? peitonitis; difficult vascular access  . Dialysis patient     Past Surgical History  Procedure Date  . Tonsillectomy and adenoidectomy   . Thyroidectomy, partial     Resectin of left lobe with reimplantation in the forearm, small focus of papillary carcinoma incidentally noted at pathology - 2000 and Hashimoto's thyrdoiditis in 2001  . Btl 1992    Family History  Problem Relation Age of Onset  . Coronary artery disease Mother   . Kidney disease Father   . Diabetes Sister     History  Substance Use Topics  . Smoking status: Former Research scientist (life sciences)  . Smokeless tobacco: Not on file  . Alcohol Use: No    OB History    Grav Para  Term Preterm Abortions TAB SAB Ect Mult Living                  Review of Systems  Constitutional: Negative for fever and chills.  Respiratory: Negative for shortness of breath.   Gastrointestinal: Negative for nausea and vomiting.  Neurological: Positive for dizziness. Negative for weakness.    Allergies  Activase and Warfarin sodium  Home Medications   Current Outpatient Rx  Name Route Sig Dispense Refill  . ALBUTEROL SULFATE HFA 108 (90 BASE) MCG/ACT IN AERS Inhalation Inhale 2 puffs into the lungs every 4 (four) hours as needed.      Marland Kitchen CALCIUM ACETATE 667 MG PO CAPS Oral Take 667 mg by mouth 3 (three) times daily with meals.      Marland Kitchen LEVOTHYROXINE SODIUM 300 MCG PO TABS Oral Take 300 mcg by mouth at bedtime.      Marland Kitchen RENA-VITE PO TABS Oral Take 1 tablet by mouth daily.      Marland Kitchen OMEPRAZOLE 20 MG PO CPDR Oral Take 20 mg by mouth 2 (two) times daily.      . SULINDAC 200 MG PO TABS Oral Take 200 mg by mouth 2 (two) times daily.        BP 100/58  Pulse 74  Temp 99 F (37.2 C) (Oral)  Resp 18  SpO2 100%  Physical Exam  Nursing note and vitals reviewed. Constitutional: She is oriented  to person, place, and time. She appears well-developed and well-nourished.  HENT:  Head: Normocephalic and atraumatic.  Eyes: Conjunctivae normal are normal.  Neck: Normal range of motion. Neck supple.  Cardiovascular: Normal rate, regular rhythm and normal heart sounds.   No murmur heard.      Good thrill and pulse to thigh dialysis graft   Pulmonary/Chest: Effort normal and breath sounds normal. No respiratory distress. She has no wheezes.  Neurological: She is alert and oriented to person, place, and time.  Skin: Skin is warm and dry.       2 cm tender indurated area over right thigh at dialysis graft with 0.5 cm ulcer over it   Psychiatric: She has a normal mood and affect. Her behavior is normal.    ED Course  Procedures (including critical care time) DIAGNOSTIC STUDIES: Oxygen  Saturation is 100% on room air, normal by my interpretation.    COORDINATION OF CARE: 7:01 PM Discussed pt ED treatment with pt  7:02 PM Ordered:   Medications  sodium chloride 0.9 % bolus 500 mL (  Intravenous Given 08/11/12 2022)  vancomycin (VANCOCIN) IVPB 1000 mg/200 mL premix (1000 mg Intravenous Given 08/11/12 2023)    8:48 PM Recheck: Discussed lab results and treatment course with pt. Pt is feeling better. Pt will be admitted.     Labs Reviewed  CBC WITH DIFFERENTIAL - Abnormal; Notable for the following:    Platelets 112 (*)     Lymphocytes Relative 11 (*)     Monocytes Relative 13 (*)     Monocytes Absolute 1.2 (*)     All other components within normal limits  BASIC METABOLIC PANEL - Abnormal; Notable for the following:    Sodium 130 (*)     Potassium 6.1 (*)     Chloride 84 (*)     Glucose, Bld 67 (*)     BUN 65 (*)     Creatinine, Ser 12.09 (*)     GFR calc non Af Amer 3 (*)     GFR calc Af Amer 4 (*)     All other components within normal limits  CULTURE, BLOOD (ROUTINE X 2)  CULTURE, BLOOD (ROUTINE X 2)   No results found.   1. Cellulitis   2. End stage renal disease on dialysis       MDM  Patient with skin lesion to right thigh right over dialysis graft. Cellulitis versus spider bite. Patient is reportedly had fevers at home. She's had some oozing of blood is reportedly had some mild arterial bleeding. She's due for dialysis tomorrow. She had some antibiotics at dialysis 2 days ago. She was given vancomycin here. Her blood pressures been marginal. She'll be admitted to medicine.  I personally performed the services described in this documentation, which was scribed in my presence. The recorded information has been reviewed and considered.        Jasper Riling. Alvino Chapel, MD 08/11/12 2122

## 2012-08-11 NOTE — ED Notes (Signed)
Attempted to call report, nurse unavailable at this time.

## 2012-08-11 NOTE — ED Notes (Signed)
?  spider bite to rt thigh, near dialysis graft site.

## 2012-08-11 NOTE — H&P (Signed)
Chief Complaint:  Right thigh sore over graft  HPI: 41 yo female esrd dialysis bout 16 years comes in with a "spider bite" sore over her right thigh graft that started bleeding more today.  She was put on abx for this infection within the last 48 hours at the dialysis unit and was told that if it started to bleed to come to the ED.  She denies fevers. She says its been oozing blood but no pus.  There is surrounding redness around the open small wound.  It is sore and painful.  No n/v/d.    Review of Systems:  O/w neg  Past Medical History: Past Medical History  Diagnosis Date  . ASCVD (arteriosclerotic cardiovascular disease)   . Hypertension   . Anemia   . Hyperparathyroidism   . Hashimoto thyroiditis   . GERD (gastroesophageal reflux disease)   . Morbid obesity   . Medically noncompliant   . ESRD (end stage renal disease)     Due to membranous GN dialysis 09/1996; peritoneal dialysis --? peitonitis; difficult vascular access  . Dialysis patient    Past Surgical History  Procedure Date  . Tonsillectomy and adenoidectomy   . Thyroidectomy, partial     Resectin of left lobe with reimplantation in the forearm, small focus of papillary carcinoma incidentally noted at pathology - 2000 and Hashimoto's thyrdoiditis in 2001  . Btl 1992    Medications: Prior to Admission medications   Medication Sig Start Date End Date Taking? Authorizing Provider  albuterol (PROVENTIL HFA;VENTOLIN HFA) 108 (90 BASE) MCG/ACT inhaler Inhale 2 puffs into the lungs every 4 (four) hours as needed.      Historical Provider, MD  calcium acetate (PHOSLO) 667 MG capsule Take 667 mg by mouth 3 (three) times daily with meals.      Historical Provider, MD  levothyroxine (SYNTHROID, LEVOTHROID) 300 MCG tablet Take 300 mcg by mouth at bedtime.      Historical Provider, MD  multivitamin (RENA-VIT) TABS tablet Take 1 tablet by mouth daily.      Historical Provider, MD  omeprazole (PRILOSEC) 20 MG capsule Take 20 mg  by mouth 2 (two) times daily.      Historical Provider, MD  sulindac (CLINORIL) 200 MG tablet Take 200 mg by mouth 2 (two) times daily.      Historical Provider, MD  traMADol-acetaminophen (ULTRACET) 37.5-325 MG per tablet Take 1 tablet by mouth.      Historical Provider, MD    Allergies:   Allergies  Allergen Reactions  . Activase (Alteplase)   . Warfarin Sodium     Social History:  reports that she has quit smoking. She does not have any smokeless tobacco history on file. She reports that she does not drink alcohol or use illicit drugs.  Family History: Family History  Problem Relation Age of Onset  . Coronary artery disease Mother   . Kidney disease Father   . Diabetes Sister     Physical Exam: Filed Vitals:   08/11/12 1830 08/11/12 1844 08/11/12 1902 08/11/12 2039  BP: 88/52 99/60 100/58   Pulse: 70  74   Temp:    99 F (37.2 C)  TempSrc:  Rectal  Oral  Resp:      SpO2: 100% 100%     General appearance: alert, cooperative and no distress Lungs: clear to auscultation bilaterally Heart: regular rate and rhythm, S1, S2 normal, no murmur, click, rub or gallop Abdomen: soft, non-tender; bowel sounds normal; no masses,  no organomegaly  Extremities: extremities normal, atraumatic, no cyanosis or edema Pulses: 2+ and symmetric Skin: Skin color, texture, turgor normal. No rashes or lesions or except on rigth anteriro thigh over shunt there is a small 0.5 cm wound with surrouding cellulitis with minimal blood oozing Neurologic: Grossly normal    Labs on Admission:   [facility] 08/11/12 1955  NA 130*  K 6.1*  CL 84*  CO2 25  GLUCOSE 67*  BUN 65*  CREATININE 12.09*  CALCIUM 8.8  MG --  PHOS --    Basename 08/11/12 1955  WBC 9.0  NEUTROABS 6.7  HGB 14.3  HCT 42.5  MCV 90.2  PLT 112*   Radiological Exams on Admission: No results found.  Assessment/Plan Present on Admission:  41 yo female esrd with wound/cellulitis over rt thigh shunt .Cellulitis .RENAL  FAILURE, END STAGE .Anemia of other chronic disease .OBESITY .ATHEROSCLEROTIC CARDIOVASCULAR DISEASE  Iv vanco.  Light pressure to area.  Monitor closely for any bleeding.  neprho consulted.  ???unclear if graft is involved or not....  DAVID,RACHAL A [id number] 08/11/2012, 9:19 PM

## 2012-08-12 ENCOUNTER — Inpatient Hospital Stay: Payer: Self-pay | Admitting: Vascular Surgery

## 2012-08-12 ENCOUNTER — Encounter (HOSPITAL_COMMUNITY): Payer: Self-pay | Admitting: *Deleted

## 2012-08-12 LAB — BASIC METABOLIC PANEL
BUN: 68 mg/dL — ABNORMAL HIGH (ref 6–23)
CO2: 23 mEq/L (ref 19–32)
Calcium: 7.8 mg/dL — ABNORMAL LOW (ref 8.4–10.5)
Chloride: 89 mEq/L — ABNORMAL LOW (ref 96–112)
Creatinine, Ser: 12.66 mg/dL — ABNORMAL HIGH (ref 0.50–1.10)
GFR calc Af Amer: 4 mL/min — ABNORMAL LOW (ref >90)
GFR calc non Af Amer: 3 mL/min — ABNORMAL LOW (ref >90)
Glucose, Bld: 97 mg/dL (ref 70–99)
Potassium: 5.1 mEq/L (ref 3.5–5.1)
Sodium: 129 mEq/L — ABNORMAL LOW (ref 135–145)

## 2012-08-12 LAB — CBC
HCT: 33.5 % — ABNORMAL LOW (ref 36.0–46.0)
Hemoglobin: 11.4 g/dL — ABNORMAL LOW (ref 12.0–15.0)
MCH: 30.5 pg (ref 26.0–34.0)
MCHC: 34 g/dL (ref 30.0–36.0)
MCV: 89.6 fL (ref 78.0–100.0)
Platelets: 116 10*3/uL — ABNORMAL LOW (ref 150–400)
RBC: 3.74 MIL/uL — ABNORMAL LOW (ref 3.87–5.11)
RDW: 14.1 % (ref 11.5–15.5)
WBC: 8.1 10*3/uL (ref 4.0–10.5)

## 2012-08-12 LAB — PROTIME-INR
INR: 1.13 (ref 0.00–1.49)
Prothrombin Time: 14.7 seconds (ref 11.6–15.2)

## 2012-08-12 MED ORDER — MORPHINE SULFATE 2 MG/ML IJ SOLN
1.0000 mg | INTRAMUSCULAR | Status: DC | PRN
  Administered 2012-08-12 (×2): 1 mg via INTRAVENOUS
  Filled 2012-08-12 (×2): qty 1

## 2012-08-12 MED ORDER — VANCOMYCIN HCL 500 MG IV SOLR
500.0000 mg | Freq: Once | INTRAVENOUS | Status: AC
  Administered 2012-08-12: 500 mg via INTRAVENOUS
  Filled 2012-08-12: qty 500

## 2012-08-12 MED ORDER — SODIUM CHLORIDE 0.9 % IJ SOLN
INTRAMUSCULAR | Status: AC
  Filled 2012-08-12: qty 3

## 2012-08-12 NOTE — Consult Note (Signed)
Reason for Consult: End-stage renal disease Referring Physician: Dr. Henderson Cloud is an 41 y.o. female.  HPI: She is a patient who has her history of generalized atherosclerotic disease, hypertension and end-stage renal disease on maintenance hemodialysis presently had came because of her bleeding her from her graft site. According to the patient she has this bleeding for a couple of days on and off. She had her dialysis on Saturday since then patient continues he was bleeding. Finally she was informed to come to the emergency room. Presently still she seems to recurrent bleeding from her graft site. Patient denies any fever chills or sweating at this moment she doesn't have any other complaints.  Past Medical History  Diagnosis Date  . ASCVD (arteriosclerotic cardiovascular disease)   . Hypertension   . Anemia   . Hyperparathyroidism   . Hashimoto thyroiditis   . GERD (gastroesophageal reflux disease)   . Morbid obesity   . Medically noncompliant   . ESRD (end stage renal disease)     Due to membranous GN dialysis 09/1996; peritoneal dialysis --? peitonitis; difficult vascular access  . Dialysis patient     Past Surgical History  Procedure Date  . Tonsillectomy and adenoidectomy   . Thyroidectomy, partial     Resectin of left lobe with reimplantation in the forearm, small focus of papillary carcinoma incidentally noted at pathology - 2000 and Hashimoto's thyrdoiditis in 2001  . Btl 1992    Family History  Problem Relation Age of Onset  . Coronary artery disease Mother   . Kidney disease Father   . Diabetes Sister     Social History:  reports that she has quit smoking. She does not have any smokeless tobacco history on file. She reports that she does not drink alcohol or use illicit drugs.  Allergies:  Allergies  Allergen Reactions  . Bee Pollen Anaphylaxis  . Activase (Alteplase) Other (See Comments)    dyspnea  . Warfarin Sodium Nausea And Vomiting and Rash     Medications: I have reviewed the patient's current medications.  Results for orders placed during the hospital encounter of 08/11/12 (from the past 48 hour(s))  CULTURE, BLOOD (ROUTINE X 2)     Status: Normal (Preliminary result)   Collection Time   08/11/12  7:35 PM      Component Value Range Comment   Specimen Description BLOOD LEFT FOREARM      Special Requests BOTTLES DRAWN AEROBIC ONLY 6CC      Culture PENDING      Report Status PENDING     CBC WITH DIFFERENTIAL     Status: Abnormal   Collection Time   08/11/12  7:55 PM      Component Value Range Comment   WBC 9.0  4.0 - 10.5 K/uL    RBC 4.71  3.87 - 5.11 MIL/uL    Hemoglobin 14.3  12.0 - 15.0 g/dL    HCT 42.5  36.0 - 46.0 %    MCV 90.2  78.0 - 100.0 fL    MCH 30.4  26.0 - 34.0 pg    MCHC 33.6  30.0 - 36.0 g/dL    RDW 14.2  11.5 - 15.5 %    Platelets 112 (*) 150 - 400 K/uL    Neutrophils Relative 73  43 - 77 %    Neutro Abs 6.7  1.7 - 7.7 K/uL    Lymphocytes Relative 11 (*) 12 - 46 %    Lymphs Abs 1.0  0.7 -  4.0 K/uL    Monocytes Relative 13 (*) 3 - 12 %    Monocytes Absolute 1.2 (*) 0.1 - 1.0 K/uL    Eosinophils Relative 2  0 - 5 %    Eosinophils Absolute 0.2  0.0 - 0.7 K/uL    Basophils Relative 1  0 - 1 %    Basophils Absolute 0.1  0.0 - 0.1 K/uL   BASIC METABOLIC PANEL     Status: Abnormal   Collection Time   08/11/12  7:55 PM      Component Value Range Comment   Sodium 130 (*) 135 - 145 mEq/L    Potassium 6.1 (*) 3.5 - 5.1 mEq/L    Chloride 84 (*) 96 - 112 mEq/L    CO2 25  19 - 32 mEq/L    Glucose, Bld 67 (*) 70 - 99 mg/dL    BUN 65 (*) 6 - 23 mg/dL    Creatinine, Ser 12.09 (*) 0.50 - 1.10 mg/dL    Calcium 8.8  8.4 - 10.5 mg/dL    GFR calc non Af Amer 3 (*) >90 mL/min    GFR calc Af Amer 4 (*) >90 mL/min   CULTURE, BLOOD (ROUTINE X 2)     Status: Normal (Preliminary result)   Collection Time   08/11/12  7:55 PM      Component Value Range Comment   Specimen Description BLOOD LEFT HAND      Special  Requests BOTTLES DRAWN AEROBIC ONLY 4CC      Culture PENDING      Report Status PENDING     BASIC METABOLIC PANEL     Status: Abnormal   Collection Time   08/12/12  5:39 AM      Component Value Range Comment   Sodium 129 (*) 135 - 145 mEq/L    Potassium 5.1  3.5 - 5.1 mEq/L    Chloride 89 (*) 96 - 112 mEq/L    CO2 23  19 - 32 mEq/L    Glucose, Bld 97  70 - 99 mg/dL    BUN 68 (*) 6 - 23 mg/dL    Creatinine, Ser 12.66 (*) 0.50 - 1.10 mg/dL    Calcium 7.8 (*) 8.4 - 10.5 mg/dL    GFR calc non Af Amer 3 (*) >90 mL/min    GFR calc Af Amer 4 (*) >90 mL/min   CBC     Status: Abnormal   Collection Time   08/12/12  5:39 AM      Component Value Range Comment   WBC 8.1  4.0 - 10.5 K/uL    RBC 3.74 (*) 3.87 - 5.11 MIL/uL    Hemoglobin 11.4 (*) 12.0 - 15.0 g/dL    HCT 33.5 (*) 36.0 - 46.0 %    MCV 89.6  78.0 - 100.0 fL    MCH 30.5  26.0 - 34.0 pg    MCHC 34.0  30.0 - 36.0 g/dL    RDW 14.1  11.5 - 15.5 %    Platelets 116 (*) 150 - 400 K/uL   PROTIME-INR     Status: Normal   Collection Time   08/12/12  5:39 AM      Component Value Range Comment   Prothrombin Time 14.7  11.6 - 15.2 seconds    INR 1.13  0.00 - 1.49     No results found.  Review of Systems  Respiratory: Negative for shortness of breath.   Cardiovascular: Negative for orthopnea and leg swelling.  Gastrointestinal:  Negative for nausea and vomiting.  Neurological: Negative for weakness.   Blood pressure 137/86, pulse 74, temperature 97.6 F (36.4 C), temperature source Oral, resp. rate 19, height 5\' 3"  (1.6 m), weight 95.709 kg (211 lb), last menstrual period 04/11/2012, SpO2 99.00%. Physical Exam  Constitutional: No distress.  Neck: No JVD present.  Cardiovascular: Normal rate and regular rhythm.   No murmur heard. Musculoskeletal: She exhibits no edema.    Assessment/Plan: Problem #1 end-stage renal disease she status post hemodialysis on Saturday her BUN and creatinine is with  in acceptable range. She does not   have any uremic sinus symptoms. Problem # 2 hyperkalemia potassium presently is normal Problem #3 patient with  history of multiple access failure presently she is only a graft on her right leg. At this moment patient has a dressing and bleeding seems to have ceased. Patient had a couple of episode of bleeding during the nighttime  H&H how ever is stable. Patient with  history of failed peritonial  dialysis. Problem #4 anemia her hemoglobin 11.4 hematocrit 33.5 Problem #6 history of hypertension Problem #7 history of hypothyroidism Plan: At this moment patient doesn't need dialysis and the possibly need to be transferred to [facility] or [facility] for her graft reevaluation. At this moment with reccurent episoides  of bleeding it is  difficult to do any dialysis using the graft We'll follow her basic metabolic panel and CBC tomorrow if she is here and if she need  dialysis she may need to have possibly a temporary catheter to be placed.  Jenene Kauffmann S 08/12/2012, 8:14 AM

## 2012-08-12 NOTE — Progress Notes (Signed)
UR Chart Review Completed  

## 2012-08-12 NOTE — Progress Notes (Signed)
ANTIBIOTIC CONSULT NOTE - INITIAL  Pharmacy Consult for Vancomycin Indication: cellulitis  Allergies  Allergen Reactions  . Bee Pollen Anaphylaxis  . Activase (Alteplase) Other (See Comments)    dyspnea  . Warfarin Sodium Nausea And Vomiting and Rash    Patient Measurements: Height: 5\' 3"  (160 cm) Weight: 211 lb (95.709 kg) IBW/kg (Calculated) : 52.4   Vital Signs: Temp: 97.6 F (36.4 C) (09/17 0511) Temp src: Oral (09/17 0511) BP: 137/86 mmHg (09/17 0511) Pulse Rate: 74  (09/17 0511) Intake/Output from previous day:   Intake/Output from this shift:    Labs:  [facility] 08/12/12 0539 08/11/12 1955  WBC 8.1 9.0  HGB 11.4* 14.3  PLT 116* 112*  LABCREA -- --  CREATININE 12.66* 12.09*   Estimated Creatinine Clearance: 6.4 ml/min (by C-G formula based on Cr of 12.66). No results found for this basename: VANCOTROUGH:2,VANCOPEAK:2,VANCORANDOM:2,GENTTROUGH:2,GENTPEAK:2,GENTRANDOM:2,TOBRATROUGH:2,TOBRAPEAK:2,TOBRARND:2,AMIKACINPEAK:2,AMIKACINTROU:2,AMIKACIN:2, in the last 72 hours   Microbiology: Recent Results (from the past 720 hour(s))  CULTURE, BLOOD (ROUTINE X 2)     Status: Normal (Preliminary result)   Collection Time   08/11/12  7:35 PM      Component Value Range Status Comment   Specimen Description BLOOD LEFT FOREARM   Final    Special Requests BOTTLES DRAWN AEROBIC ONLY 6CC   Final    Culture PENDING   Incomplete    Report Status PENDING   Incomplete   CULTURE, BLOOD (ROUTINE X 2)     Status: Normal (Preliminary result)   Collection Time   08/11/12  7:55 PM      Component Value Range Status Comment   Specimen Description BLOOD LEFT HAND   Final    Special Requests BOTTLES DRAWN AEROBIC ONLY 4CC   Final    Culture PENDING   Incomplete    Report Status PENDING   Incomplete     Medical History: Past Medical History  Diagnosis Date  . ASCVD (arteriosclerotic cardiovascular disease)   . Hypertension   . Anemia   . Hyperparathyroidism   . Hashimoto  thyroiditis   . GERD (gastroesophageal reflux disease)   . Morbid obesity   . Medically noncompliant   . ESRD (end stage renal disease)     Due to membranous GN dialysis 09/1996; peritoneal dialysis --? peitonitis; difficult vascular access  . Dialysis patient     Medications:  Scheduled:    . levothyroxine  300 mcg Oral QHS  . liothyronine  25 mcg Oral Daily  . losartan  50 mg Oral Daily  . sodium chloride  500 mL Intravenous Once  . sodium chloride  3 mL Intravenous Q12H  . sodium chloride  3 mL Intravenous Q12H  . vancomycin  1,000 mg Intravenous Once  . DISCONTD: levothyroxine  300 mcg Oral QHS   Assessment: 41 yo F with hx ESRD requiring HD presents with cellulitis of right thigh from spider bite.  She received Vancomycin 1gm IV x1 on admission.  Infected area involves dialysis graft so currently HD is being held.   Goal of Therapy:  pre-HD level 15-25 mcg/ml  Plan:  1) Vancomycin 500mg  IV x1 dose to complete load 2) F/U plans for HD to schedule subsequent doses with dialysis.   Biagio Borg 08/12/2012,8:34 AM

## 2012-08-12 NOTE — Progress Notes (Signed)
Pt. Transported [facility] to [facility]. Report to receiving RN @ hospital. Pt. Recently medicated and rates pain at 3/10. No acute distress noted.

## 2012-08-12 NOTE — Progress Notes (Signed)
Patient shunt started bleeding again, moderate amount of bright red blood noted, applied pressure for 10 minutes, reapplied pressure dressing, called and spoke with Dr. Anastasio Champion, asked to paged Dr. Harvest Dark this morning to see patient as soon as possible

## 2012-08-12 NOTE — Progress Notes (Signed)
Called to room by patient, patient c/o burning and pain in right upper thigh where wound is, site began to bleed a small to moderate amount of bright red blood, applied pressure for 10 mins, site noted to be pulsating, called Dr. Shanon Brow and made aware, no new orders at this time, bleeding has decreased at this time, applied new dressing,

## 2012-08-12 NOTE — Discharge Summary (Signed)
Physician Discharge Summary  Lori [name] [id number] DOB: 02/15/1971 DOA: 08/11/2012  PCP: No primary provider on file.  Admit date: 08/11/2012 Discharge date: 08/12/2012  Discharge Diagnoses:  1. Bleeding at site of AV graft for dialysis, right thigh.Patient is being transferred to [facility] for further management of #1. 2. End-stage renal disease on hemodialysis. 3. Anemia of chronic disease. 4. Obesity.    Discharge Condition: Stable.  Diet recommendation: Renal diet.  Filed Weights   08/12/12 0027  Weight: 95.709 kg (211 lb)    History of present illness:  This 41 year old lady was admitted yesterday with symptoms of bleeding from the right thigh at the site of the AV graft for dialysis. Apparently she had been bitten with a spider bite recently. Please see initial history as outlined below: 41 yo female esrd dialysis bout 16 years comes in with a "spider bite" sore over her right thigh graft that started bleeding more today. She was put on abx for this infection within the last 48 hours at the dialysis unit and was told that if it started to bleed to come to the ED. She denies fevers. She says its been oozing blood but no pus. There is surrounding redness around the open small wound. It is sore and painful. No n/v/d.  Hospital Course:  The patient was admitted overnight and has been stable. Pressure has been applied to the right thigh to stop the bleeding at the present time there is no bleeding. Unfortunately she is unable to have dialysis from this site, which she needs. She had this graft done at [place] contacted the vascular surgeons there and they have graciously accepted patient in transfer.  Procedures:  None.  Consultations:  Nephrology, Dr Hinda Lenis.  Discharge Exam: Filed Vitals:   08/11/12 2258 08/12/12 0027 08/12/12 0340 08/12/12 0511  BP: 93/53 118/82  137/86  Pulse:  73  74  Temp:  97.9 F (36.6 C)  97.6 F  (36.4 C)  TempSrc:  Oral  Oral  Resp:  19  19  Height:   5\' 3"  (1.6 m)   Weight:  95.709 kg (211 lb)    SpO2:  100%  99%    General: Looks systemically well. She is hemodynamically stable. Cardiovascular: Heart sounds present and normal without murmurs. Respiratory: Lung fields are clear. She is alert and oriented.  Discharge Instructions  Discharge Orders    Future Orders Please Complete By Expires   Diet - low sodium heart healthy      Increase activity slowly          Medication List     As of 08/12/2012 10:52 AM    TAKE these medications         albuterol 108 (90 BASE) MCG/ACT inhaler   Commonly known as: PROVENTIL HFA;VENTOLIN HFA   Inhale 2 puffs into the lungs every 6 (six) hours as needed. Shortness of breath      aspirin EC 81 MG tablet   Take 81 mg by mouth daily.      calcium elemental as carbonate 400 MG tablet   Commonly known as: BARIATRIC TUMS ULTRA   Chew 1,000 mg by mouth 3 (three) times daily. Patient also takes 1 tablet twice a day with snack      lanthanum 1000 MG chewable tablet   Commonly known as: FOSRENOL   Chew 1,000-2,000 mg by mouth 3 (three) times daily with meals. 2 tablets with meals and 1 tablet with snack  levothyroxine 300 MCG tablet   Commonly known as: SYNTHROID, LEVOTHROID   Take 300 mcg by mouth at bedtime.      liothyronine 25 MCG tablet   Commonly known as: CYTOMEL   Take 25 mcg by mouth daily.      losartan 50 MG tablet   Commonly known as: COZAAR   Take 50 mg by mouth daily.      multivitamin Tabs tablet   Take 1 tablet by mouth daily.      omeprazole 20 MG capsule   Commonly known as: PRILOSEC   Take 20 mg by mouth 2 (two) times daily.          The results of significant diagnostics from this hospitalization (including imaging, microbiology, ancillary and laboratory) are listed below for reference.    Significant Diagnostic Studies: No results found.  Microbiology: Recent Results (from the past 240  hour(s))  CULTURE, BLOOD (ROUTINE X 2)     Status: Normal (Preliminary result)   Collection Time   08/11/12  7:35 PM      Component Value Range Status Comment   Specimen Description BLOOD LEFT FOREARM   Final    Special Requests BOTTLES DRAWN AEROBIC ONLY 6CC   Final    Culture NO GROWTH 1 DAY   Final    Report Status PENDING   Incomplete   CULTURE, BLOOD (ROUTINE X 2)     Status: Normal (Preliminary result)   Collection Time   08/11/12  7:55 PM      Component Value Range Status Comment   Specimen Description BLOOD LEFT HAND   Final    Special Requests BOTTLES DRAWN AEROBIC ONLY 4CC   Final    Culture NO GROWTH 1 DAY   Final    Report Status PENDING   Incomplete      Labs: Basic Metabolic Panel:  Lab [id number] 0539 08/11/12 1955  NA 129* 130*  K 5.1 6.1*  CL 89* 84*  CO2 23 25  GLUCOSE 97 67*  BUN 68* 65*  CREATININE 12.66* 12.09*  CALCIUM 7.8* 8.8  MG -- --  PHOS -- --       CBC:  Lab 08/12/12 0539 08/11/12 1955  WBC 8.1 9.0  NEUTROABS -- 6.7  HGB 11.4* 14.3  HCT 33.5* 42.5  MCV 89.6 90.2  PLT 116* 112*    Time coordinating discharge: *Greater than 30 minutes  Signed:  GOSRANI,NIMISH C  [facility] 08/12/2012, 10:52 AM

## 2012-08-13 LAB — BASIC METABOLIC PANEL
Anion Gap: 13 (ref 7–16)
BUN: 84 mg/dL — ABNORMAL HIGH (ref 7–18)
Calcium, Total: 7.7 mg/dL — ABNORMAL LOW (ref 8.5–10.1)
Chloride: 91 mmol/L — ABNORMAL LOW (ref 98–107)
Co2: 26 mmol/L (ref 21–32)
Creatinine: 14.73 mg/dL — ABNORMAL HIGH (ref 0.60–1.30)
EGFR (African American): 3 — ABNORMAL LOW
EGFR (Non-African Amer.): 3 — ABNORMAL LOW
Glucose: 84 mg/dL (ref 65–99)
Osmolality: 285 (ref 275–301)
Potassium: 5.5 mmol/L — ABNORMAL HIGH (ref 3.5–5.1)
Sodium: 130 mmol/L — ABNORMAL LOW (ref 136–145)

## 2012-08-13 LAB — CBC WITH DIFFERENTIAL/PLATELET
Basophil #: 0.1 10*3/uL (ref 0.0–0.1)
Basophil %: 0.8 %
Eosinophil #: 0.2 10*3/uL (ref 0.0–0.7)
Eosinophil %: 2.5 %
HCT: 35.4 % (ref 35.0–47.0)
HGB: 12 g/dL (ref 12.0–16.0)
Lymphocyte #: 1.5 10*3/uL (ref 1.0–3.6)
Lymphocyte %: 19.2 %
MCH: 30.8 pg (ref 26.0–34.0)
MCHC: 34 g/dL (ref 32.0–36.0)
MCV: 91 fL (ref 80–100)
Monocyte #: 1.2 x10 3/mm — ABNORMAL HIGH (ref 0.2–0.9)
Monocyte %: 15 %
Neutrophil #: 4.9 10*3/uL (ref 1.4–6.5)
Neutrophil %: 62.5 %
Platelet: 119 10*3/uL — ABNORMAL LOW (ref 150–440)
RBC: 3.9 10*6/uL (ref 3.80–5.20)
RDW: 14.8 % — ABNORMAL HIGH (ref 11.5–14.5)
WBC: 7.9 10*3/uL (ref 3.6–11.0)

## 2012-08-14 LAB — CBC WITH DIFFERENTIAL/PLATELET
Basophil #: 0.1 10*3/uL (ref 0.0–0.1)
Basophil %: 0.7 %
Eosinophil #: 0.1 10*3/uL (ref 0.0–0.7)
Eosinophil %: 1.7 %
HCT: 32.6 % — ABNORMAL LOW (ref 35.0–47.0)
HGB: 11 g/dL — ABNORMAL LOW (ref 12.0–16.0)
Lymphocyte #: 1.3 10*3/uL (ref 1.0–3.6)
Lymphocyte %: 15.7 %
MCH: 30.6 pg (ref 26.0–34.0)
MCHC: 33.7 g/dL (ref 32.0–36.0)
MCV: 91 fL (ref 80–100)
Monocyte #: 0.9 x10 3/mm (ref 0.2–0.9)
Monocyte %: 11 %
Neutrophil #: 5.9 10*3/uL (ref 1.4–6.5)
Neutrophil %: 70.9 %
Platelet: 123 10*3/uL — ABNORMAL LOW (ref 150–440)
RBC: 3.59 10*6/uL — ABNORMAL LOW (ref 3.80–5.20)
RDW: 14.6 % — ABNORMAL HIGH (ref 11.5–14.5)
WBC: 8.2 10*3/uL (ref 3.6–11.0)

## 2012-08-14 LAB — BASIC METABOLIC PANEL
Anion Gap: 15 (ref 7–16)
BUN: 91 mg/dL — ABNORMAL HIGH (ref 7–18)
Calcium, Total: 7.8 mg/dL — ABNORMAL LOW (ref 8.5–10.1)
Chloride: 93 mmol/L — ABNORMAL LOW (ref 98–107)
Co2: 22 mmol/L (ref 21–32)
Creatinine: 16.42 mg/dL — ABNORMAL HIGH (ref 0.60–1.30)
EGFR (African American): 3 — ABNORMAL LOW
EGFR (Non-African Amer.): 2 — ABNORMAL LOW
Glucose: 92 mg/dL (ref 65–99)
Osmolality: 288 (ref 275–301)
Potassium: 6.6 mmol/L (ref 3.5–5.1)
Sodium: 130 mmol/L — ABNORMAL LOW (ref 136–145)

## 2012-08-14 LAB — CULTURE, BLOOD (ROUTINE X 2)

## 2012-08-14 LAB — PHOSPHORUS: Phosphorus: 7.3 mg/dL — ABNORMAL HIGH (ref 2.5–4.9)

## 2012-08-16 LAB — CBC WITH DIFFERENTIAL/PLATELET
Basophil #: 0 10*3/uL (ref 0.0–0.1)
Basophil %: 0.7 %
Eosinophil #: 0.4 10*3/uL (ref 0.0–0.7)
Eosinophil %: 5.7 %
HCT: 31 % — ABNORMAL LOW (ref 35.0–47.0)
HGB: 10.5 g/dL — ABNORMAL LOW (ref 12.0–16.0)
Lymphocyte #: 2.3 10*3/uL (ref 1.0–3.6)
Lymphocyte %: 33.8 %
MCH: 30.8 pg (ref 26.0–34.0)
MCHC: 34 g/dL (ref 32.0–36.0)
MCV: 91 fL (ref 80–100)
Monocyte #: 0.6 x10 3/mm (ref 0.2–0.9)
Monocyte %: 8.7 %
Neutrophil #: 3.5 10*3/uL (ref 1.4–6.5)
Neutrophil %: 51.1 %
Platelet: 175 10*3/uL (ref 150–440)
RBC: 3.42 10*6/uL — ABNORMAL LOW (ref 3.80–5.20)
RDW: 14.7 % — ABNORMAL HIGH (ref 11.5–14.5)
WBC: 6.8 10*3/uL (ref 3.6–11.0)

## 2012-08-16 LAB — RENAL FUNCTION PANEL
Albumin: 2.7 g/dL — ABNORMAL LOW (ref 3.4–5.0)
Anion Gap: 14 (ref 7–16)
BUN: 63 mg/dL — ABNORMAL HIGH (ref 7–18)
Calcium, Total: 8.7 mg/dL (ref 8.5–10.1)
Chloride: 96 mmol/L — ABNORMAL LOW (ref 98–107)
Co2: 27 mmol/L (ref 21–32)
Creatinine: 13.03 mg/dL — ABNORMAL HIGH (ref 0.60–1.30)
EGFR (African American): 4 — ABNORMAL LOW
EGFR (Non-African Amer.): 3 — ABNORMAL LOW
Glucose: 72 mg/dL (ref 65–99)
Osmolality: 290 (ref 275–301)
Phosphorus: 5.9 mg/dL — ABNORMAL HIGH (ref 2.5–4.9)
Potassium: 4.7 mmol/L (ref 3.5–5.1)
Sodium: 137 mmol/L (ref 136–145)

## 2012-08-16 LAB — CULTURE, BLOOD (ROUTINE X 2): Culture: NO GROWTH

## 2012-08-17 LAB — TROPONIN I: Troponin-I: 0.03 ng/mL

## 2012-08-17 LAB — CK TOTAL AND CKMB (NOT AT ARMC)
CK, Total: 38 U/L (ref 21–215)
CK-MB: 0.8 ng/mL (ref 0.5–3.6)

## 2012-08-19 LAB — POTASSIUM: Potassium: 4.9 mmol/L (ref 3.5–5.1)

## 2012-08-19 LAB — VANCOMYCIN, TROUGH: Vancomycin, Trough: 25 ug/mL (ref 10–20)

## 2012-08-20 LAB — VANCOMYCIN, TROUGH: Vancomycin, Trough: 23 ug/mL (ref 10–20)

## 2012-08-21 LAB — CBC WITH DIFFERENTIAL/PLATELET
Basophil #: 0.1 10*3/uL (ref 0.0–0.1)
Basophil %: 1 %
Eosinophil #: 0.1 10*3/uL (ref 0.0–0.7)
Eosinophil %: 1.4 %
HCT: 29 % — ABNORMAL LOW (ref 35.0–47.0)
HGB: 9.9 g/dL — ABNORMAL LOW (ref 12.0–16.0)
Lymphocyte #: 1.2 10*3/uL (ref 1.0–3.6)
Lymphocyte %: 16.3 %
MCH: 30.8 pg (ref 26.0–34.0)
MCHC: 34 g/dL (ref 32.0–36.0)
MCV: 91 fL (ref 80–100)
Monocyte #: 0.8 x10 3/mm (ref 0.2–0.9)
Monocyte %: 10.9 %
Neutrophil #: 5.1 10*3/uL (ref 1.4–6.5)
Neutrophil %: 70.4 %
Platelet: 219 10*3/uL (ref 150–440)
RBC: 3.21 10*6/uL — ABNORMAL LOW (ref 3.80–5.20)
RDW: 14.7 % — ABNORMAL HIGH (ref 11.5–14.5)
WBC: 7.3 10*3/uL (ref 3.6–11.0)

## 2012-08-21 LAB — RENAL FUNCTION PANEL
Albumin: 3 g/dL — ABNORMAL LOW (ref 3.4–5.0)
Anion Gap: 8 (ref 7–16)
BUN: 40 mg/dL — ABNORMAL HIGH (ref 7–18)
Calcium, Total: 8.6 mg/dL (ref 8.5–10.1)
Chloride: 99 mmol/L (ref 98–107)
Co2: 31 mmol/L (ref 21–32)
Creatinine: 9.05 mg/dL — ABNORMAL HIGH (ref 0.60–1.30)
EGFR (African American): 6 — ABNORMAL LOW
EGFR (Non-African Amer.): 5 — ABNORMAL LOW
Glucose: 80 mg/dL (ref 65–99)
Osmolality: 284 (ref 275–301)
Phosphorus: 6 mg/dL — ABNORMAL HIGH (ref 2.5–4.9)
Potassium: 5.6 mmol/L — ABNORMAL HIGH (ref 3.5–5.1)
Sodium: 138 mmol/L (ref 136–145)

## 2012-08-21 LAB — WOUND CULTURE

## 2012-08-23 LAB — CULTURE, BLOOD (SINGLE)

## 2012-12-10 ENCOUNTER — Ambulatory Visit: Payer: Self-pay | Admitting: Vascular Surgery

## 2012-12-13 ENCOUNTER — Emergency Department (HOSPITAL_COMMUNITY): Payer: Medicare Other

## 2012-12-13 ENCOUNTER — Encounter (HOSPITAL_COMMUNITY): Payer: Self-pay | Admitting: *Deleted

## 2012-12-13 ENCOUNTER — Emergency Department (HOSPITAL_COMMUNITY)
Admission: EM | Admit: 2012-12-13 | Discharge: 2012-12-13 | Disposition: A | Discharge status: Home / Self Care | Payer: Medicare Other | Attending: Emergency Medicine | Admitting: Emergency Medicine

## 2012-12-13 DIAGNOSIS — I2510 Atherosclerotic heart disease of native coronary artery without angina pectoris: Secondary | ICD-10-CM | POA: Insufficient documentation

## 2012-12-13 DIAGNOSIS — I10 Essential (primary) hypertension: Secondary | ICD-10-CM | POA: Insufficient documentation

## 2012-12-13 DIAGNOSIS — W19XXXA Unspecified fall, initial encounter: Secondary | ICD-10-CM

## 2012-12-13 DIAGNOSIS — S20229A Contusion of unspecified back wall of thorax, initial encounter: Secondary | ICD-10-CM | POA: Insufficient documentation

## 2012-12-13 DIAGNOSIS — N186 End stage renal disease: Secondary | ICD-10-CM | POA: Insufficient documentation

## 2012-12-13 DIAGNOSIS — Y929 Unspecified place or not applicable: Secondary | ICD-10-CM | POA: Insufficient documentation

## 2012-12-13 DIAGNOSIS — Z862 Personal history of diseases of the blood and blood-forming organs and certain disorders involving the immune mechanism: Secondary | ICD-10-CM | POA: Insufficient documentation

## 2012-12-13 DIAGNOSIS — Z87891 Personal history of nicotine dependence: Secondary | ICD-10-CM | POA: Insufficient documentation

## 2012-12-13 DIAGNOSIS — K219 Gastro-esophageal reflux disease without esophagitis: Secondary | ICD-10-CM | POA: Insufficient documentation

## 2012-12-13 DIAGNOSIS — W010XXA Fall on same level from slipping, tripping and stumbling without subsequent striking against object, initial encounter: Secondary | ICD-10-CM | POA: Insufficient documentation

## 2012-12-13 DIAGNOSIS — Y939 Activity, unspecified: Secondary | ICD-10-CM | POA: Insufficient documentation

## 2012-12-13 DIAGNOSIS — Z79899 Other long term (current) drug therapy: Secondary | ICD-10-CM | POA: Insufficient documentation

## 2012-12-13 DIAGNOSIS — E079 Disorder of thyroid, unspecified: Secondary | ICD-10-CM | POA: Insufficient documentation

## 2012-12-13 DIAGNOSIS — Z992 Dependence on renal dialysis: Secondary | ICD-10-CM | POA: Insufficient documentation

## 2012-12-13 NOTE — ED Notes (Signed)
Pt tripped and fell last night. Sent here today by dialysis nurse due to "huge" knot and bruising to upper back

## 2012-12-13 NOTE — ED Provider Notes (Signed)
History     CSN: [id number]  Arrival date & time 12/13/12  77   First MD Initiated Contact with Patient 12/13/12 1626      Chief Complaint  Patient presents with  . Fall  . Back Pain     HPI Pt was seen at 1635.   Per pt, c/o sudden onset and resolution of one episode of slip and fall last night. States she hit her mid-upper back against a shelf and left knee against the floor.  States she went to her usual HD treatment today and was told to come to the ED for eval for a "bruise on my back."  Has been ambulatory since the fall. Denies hitting head, no LOC, no visual changes, no neck pain, no focal motor weakness, no tingling/numbness in extremities, no CP/SOB, no abd pain, no N/V/D.    Past Medical History  Diagnosis Date  . ASCVD (arteriosclerotic cardiovascular disease)   . Hypertension   . Anemia   . Hyperparathyroidism   . Hashimoto thyroiditis   . GERD (gastroesophageal reflux disease)   . Morbid obesity   . Medically noncompliant   . ESRD (end stage renal disease)     Due to membranous GN dialysis 09/1996; peritoneal dialysis --? peitonitis; difficult vascular access  . Dialysis patient     Past Surgical History  Procedure Date  . Tonsillectomy and adenoidectomy   . Thyroidectomy, partial     Resectin of left lobe with reimplantation in the forearm, small focus of papillary carcinoma incidentally noted at pathology - 2000 and Hashimoto's thyrdoiditis in 2001  . Btl 1992    Family History  Problem Relation Age of Onset  . Coronary artery disease Mother   . Kidney disease Father   . Diabetes Sister     History  Substance Use Topics  . Smoking status: Former Research scientist (life sciences)  . Smokeless tobacco: Not on file  . Alcohol Use: No    Review of Systems ROS: Statement: All systems negative except as marked or noted in the HPI; Constitutional: Negative for fever and chills. ; ; Eyes: Negative for eye pain, redness and discharge. ; ; ENMT: Negative for ear pain,  hoarseness, nasal congestion, sinus pressure and sore throat. ; ; Cardiovascular: Negative for chest pain, palpitations, diaphoresis, dyspnea and peripheral edema. ; ; Respiratory: Negative for cough, wheezing and stridor. ; ; Gastrointestinal: Negative for nausea, vomiting, diarrhea, abdominal pain, blood in stool, hematemesis, jaundice and rectal bleeding. . ; ; Genitourinary: Negative for dysuria, flank pain and hematuria. ; ; Musculoskeletal: +back pain. Negative for neck pain. Negative for swelling and trauma.; ; Skin: +bruising. Negative for pruritus, rash, abrasions, blisters, and skin lesion.; ; Neuro: Negative for headache, lightheadedness and neck stiffness. Negative for weakness, altered level of consciousness , altered mental status, extremity weakness, paresthesias, involuntary movement, seizure and syncope.       Allergies  Bee pollen; Activase; and Warfarin sodium  Home Medications   Current Outpatient Rx  Name  Route  Sig  Dispense  Refill  . ALBUTEROL SULFATE HFA 108 (90 BASE) MCG/ACT IN AERS   Inhalation   Inhale 2 puffs into the lungs every 6 (six) hours as needed. Shortness of breath         . CALCIUM CARBONATE ANTACID 1000 MG PO CHEW   Oral   Chew 1,000 mg by mouth 3 (three) times daily. Patient also takes 1 tablet twice a day with snack         . LANTHANUM  CARBONATE 1000 MG PO CHEW   Oral   Chew 1,000-2,000 mg by mouth 3 (three) times daily with meals. 2 tablets with meals and 1 tablet with snack         . LEVOTHYROXINE SODIUM 300 MCG PO TABS   Oral   Take 300 mcg by mouth at bedtime.           Marland Kitchen LIOTHYRONINE SODIUM 25 MCG PO TABS   Oral   Take 25 mcg by mouth daily.         Marland Kitchen LOSARTAN POTASSIUM 50 MG PO TABS   Oral   Take 50 mg by mouth daily.         Marland Kitchen RENA-VITE PO TABS   Oral   Take 1 tablet by mouth daily.           Marland Kitchen OMEPRAZOLE 20 MG PO CPDR   Oral   Take 20 mg by mouth 2 (two) times daily.           BP 103/65  Pulse 107  Temp  97.3 F (36.3 C) (Oral)  Resp 18  Ht 5\' 5"  (1.651 m)  Wt 208 lb 5.4 oz (94.501 kg)  BMI 34.67 kg/m2  SpO2 91%  Physical Exam 1640: Physical examination:  Nursing notes reviewed; Vital signs and O2 SAT reviewed;  Constitutional: Well developed, Well nourished, Well hydrated, In no acute distress; Head:  Normocephalic, atraumatic; Eyes: EOMI, PERRL, No scleral icterus; ENMT: Mouth and pharynx normal, Mucous membranes moist; Neck: Supple, Full range of motion, No lymphadenopathy; Cardiovascular: Regular rate and rhythm, No gallop; Respiratory: Breath sounds clear & equal bilaterally, No rales, rhonchi, wheezes.  Speaking full sentences with ease, Normal respiratory effort/excursion; Chest: Nontender, Movement normal; Spine:  No midline CS, TS, LS tenderness.  +TTP bilat thoracic paraspinal muscles with faint localized contusion to mid-upper back. No open wounds.;; Extremities: Pulses normal, No tenderness, No deformity. NT left knee without edema, erythema, warmth, ecchymosis, open wounds, or deformity. +FROM left knee, including able to lift extended LLE off stretcher, and extend left lower leg against resistance.  No ligamentous laxity.  No patellar or quad tendon step-offs.  NMS intact left foot, strong pedal pp. +plantarflexion of left foot w/calf squeeze.  No palpable gap left Achilles's tendon.  No proximal fibular head tenderness.  No calf edema or asymmetry.; Neuro: AA&Ox3, Major CN grossly intact. No facial droop. Speech clear. Climbs on and off stretcher by herself without difficulty.  Gait steady. Strength 5/5 equal bilat UE's and LE's.  DTR 2/4 equal bilat UE's and LE's. No gross focal motor or sensory deficits in extremities.; Skin: Color normal, Warm, Dry, +multiple scabbed abrasions to bilat LE's without drainage or surrounding erythema.   ED Course  Procedures   MDM  MDM Reviewed: nursing note and vitals Interpretation: x-ray   Dg Thoracic Spine 2 View 12/13/2012  *RADIOLOGY  REPORT*  Clinical Data: Mid back pain following fall.  THORACIC SPINE - 2 VIEW  Comparison: 08/01/2010 and prior chest radiographs  Findings: Normal alignment is noted. There is no evidence of fracture or subluxation. Very mild multilevel degenerative disc disease noted. No focal bony lesions are present. A right central venous catheter is identified with tips overlying cavoatrial junction.  IMPRESSION:  No evidence of acute bony abnormality within the thoracic spine.   Original Report Authenticated By: Margarette Canada, M.D.       1800:  No acute fx. No neuro deficits. Will tx symptomatically for contusion. Pt has gotten herself dressed  and wants to go home now.  Dx and testing d/w pt.  Questions answered.  Verb understanding, agreeable to d/c home with outpt f/u.        Alfonzo Feller, DO 12/15/12 1309

## 2012-12-21 DIAGNOSIS — I120 Hypertensive chronic kidney disease with stage 5 chronic kidney disease or end stage renal disease: Secondary | ICD-10-CM | POA: Insufficient documentation

## 2013-03-11 ENCOUNTER — Ambulatory Visit: Payer: Self-pay | Admitting: Vascular Surgery

## 2013-03-11 LAB — BASIC METABOLIC PANEL
Anion Gap: 8 (ref 7–16)
BUN: 47 mg/dL — ABNORMAL HIGH (ref 7–18)
Calcium, Total: 7.6 mg/dL — ABNORMAL LOW (ref 8.5–10.1)
Chloride: 99 mmol/L (ref 98–107)
Co2: 27 mmol/L (ref 21–32)
Creatinine: 9.65 mg/dL — ABNORMAL HIGH (ref 0.60–1.30)
EGFR (African American): 5 — ABNORMAL LOW
EGFR (Non-African Amer.): 4 — ABNORMAL LOW
Glucose: 79 mg/dL (ref 65–99)
Osmolality: 279 (ref 275–301)
Potassium: 4.8 mmol/L (ref 3.5–5.1)
Sodium: 134 mmol/L — ABNORMAL LOW (ref 136–145)

## 2013-03-11 LAB — CBC
HCT: 38 % (ref 35.0–47.0)
HGB: 12.6 g/dL (ref 12.0–16.0)
MCH: 30 pg (ref 26.0–34.0)
MCHC: 33.2 g/dL (ref 32.0–36.0)
MCV: 90 fL (ref 80–100)
Platelet: 231 10*3/uL (ref 150–440)
RBC: 4.21 10*6/uL (ref 3.80–5.20)
RDW: 14.5 % (ref 11.5–14.5)
WBC: 8.2 10*3/uL (ref 3.6–11.0)

## 2013-03-25 ENCOUNTER — Ambulatory Visit: Payer: Self-pay | Admitting: Vascular Surgery

## 2013-03-25 LAB — HCG, QUANTITATIVE, PREGNANCY: Beta Hcg, Quant.: <1 m[IU]/mL — ABNORMAL LOW

## 2013-03-25 LAB — POTASSIUM: Potassium: 5.1 mmol/L (ref 3.5–5.1)

## 2013-04-25 LAB — COMPREHENSIVE METABOLIC PANEL
Albumin: 3.1 g/dL — ABNORMAL LOW (ref 3.4–5.0)
Alkaline Phosphatase: 111 U/L (ref 50–136)
Anion Gap: 10 (ref 7–16)
BUN: 16 mg/dL (ref 7–18)
Bilirubin,Total: 0.4 mg/dL (ref 0.2–1.0)
Calcium, Total: 9.2 mg/dL (ref 8.5–10.1)
Chloride: 95 mmol/L — ABNORMAL LOW (ref 98–107)
Co2: 30 mmol/L (ref 21–32)
Creatinine: 5.33 mg/dL — ABNORMAL HIGH (ref 0.60–1.30)
EGFR (African American): 11 — ABNORMAL LOW
EGFR (Non-African Amer.): 9 — ABNORMAL LOW
Glucose: 114 mg/dL — ABNORMAL HIGH (ref 65–99)
Osmolality: 272 (ref 275–301)
Potassium: 3.2 mmol/L — ABNORMAL LOW (ref 3.5–5.1)
SGOT(AST): 15 U/L (ref 15–37)
SGPT (ALT): 12 U/L (ref 12–78)
Sodium: 135 mmol/L — ABNORMAL LOW (ref 136–145)
Total Protein: 7.7 g/dL (ref 6.4–8.2)

## 2013-04-25 LAB — CBC WITH DIFFERENTIAL/PLATELET
Basophil #: 0.2 10*3/uL — ABNORMAL HIGH (ref 0.0–0.1)
Basophil %: 0.9 %
Eosinophil #: 0.2 10*3/uL (ref 0.0–0.7)
Eosinophil %: 1.2 %
HCT: 35.9 % (ref 35.0–47.0)
HGB: 11.7 g/dL — ABNORMAL LOW (ref 12.0–16.0)
Lymphocyte #: 1.4 10*3/uL (ref 1.0–3.6)
Lymphocyte %: 7.1 %
MCH: 29 pg (ref 26.0–34.0)
MCHC: 32.6 g/dL (ref 32.0–36.0)
MCV: 89 fL (ref 80–100)
Monocyte #: 1.6 x10 3/mm — ABNORMAL HIGH (ref 0.2–0.9)
Monocyte %: 8 %
Neutrophil #: 16.7 10*3/uL — ABNORMAL HIGH (ref 1.4–6.5)
Neutrophil %: 82.8 %
Platelet: 479 10*3/uL — ABNORMAL HIGH (ref 150–440)
RBC: 4.02 10*6/uL (ref 3.80–5.20)
RDW: 15.1 % — ABNORMAL HIGH (ref 11.5–14.5)
WBC: 20.2 10*3/uL — ABNORMAL HIGH (ref 3.6–11.0)

## 2013-04-25 LAB — PROTIME-INR
INR: 1.1
Prothrombin Time: 14.8 secs — ABNORMAL HIGH (ref 11.5–14.7)

## 2013-04-25 LAB — APTT: Activated PTT: 44.5 secs — ABNORMAL HIGH (ref 23.6–35.9)

## 2013-04-26 ENCOUNTER — Inpatient Hospital Stay: Payer: Self-pay | Admitting: Family Medicine

## 2013-04-26 ENCOUNTER — Ambulatory Visit: Payer: Self-pay | Admitting: Oncology

## 2013-04-26 LAB — BASIC METABOLIC PANEL
Anion Gap: 8 (ref 7–16)
BUN: 23 mg/dL — ABNORMAL HIGH (ref 7–18)
Calcium, Total: 9.3 mg/dL (ref 8.5–10.1)
Chloride: 94 mmol/L — ABNORMAL LOW (ref 98–107)
Co2: 29 mmol/L (ref 21–32)
Creatinine: 6.71 mg/dL — ABNORMAL HIGH (ref 0.60–1.30)
EGFR (African American): 8 — ABNORMAL LOW
EGFR (Non-African Amer.): 7 — ABNORMAL LOW
Glucose: 83 mg/dL (ref 65–99)
Osmolality: 265 (ref 275–301)
Potassium: 4.4 mmol/L (ref 3.5–5.1)
Sodium: 131 mmol/L — ABNORMAL LOW (ref 136–145)

## 2013-04-26 LAB — CBC WITH DIFFERENTIAL/PLATELET
Basophil #: 0.2 10*3/uL — ABNORMAL HIGH (ref 0.0–0.1)
Basophil %: 1.2 %
Eosinophil #: 0.4 10*3/uL (ref 0.0–0.7)
Eosinophil %: 2.1 %
HCT: 33.9 % — ABNORMAL LOW (ref 35.0–47.0)
HGB: 11.1 g/dL — ABNORMAL LOW (ref 12.0–16.0)
Lymphocyte #: 1.3 10*3/uL (ref 1.0–3.6)
Lymphocyte %: 6.8 %
MCH: 29.4 pg (ref 26.0–34.0)
MCHC: 32.8 g/dL (ref 32.0–36.0)
MCV: 90 fL (ref 80–100)
Monocyte #: 1.7 x10 3/mm — ABNORMAL HIGH (ref 0.2–0.9)
Monocyte %: 9 %
Neutrophil #: 15.1 10*3/uL — ABNORMAL HIGH (ref 1.4–6.5)
Neutrophil %: 80.9 %
Platelet: 398 10*3/uL (ref 150–440)
RBC: 3.78 10*6/uL — ABNORMAL LOW (ref 3.80–5.20)
RDW: 15 % — ABNORMAL HIGH (ref 11.5–14.5)
WBC: 18.7 10*3/uL — ABNORMAL HIGH (ref 3.6–11.0)

## 2013-04-28 LAB — CBC WITH DIFFERENTIAL/PLATELET
Basophil #: 0.3 10*3/uL — ABNORMAL HIGH (ref 0.0–0.1)
Basophil %: 1.2 %
Eosinophil #: 0.6 10*3/uL (ref 0.0–0.7)
Eosinophil %: 2.6 %
HCT: 30.3 % — ABNORMAL LOW (ref 35.0–47.0)
HGB: 10.1 g/dL — ABNORMAL LOW (ref 12.0–16.0)
Lymphocyte #: 3 10*3/uL (ref 1.0–3.6)
Lymphocyte %: 13.1 %
MCH: 29.5 pg (ref 26.0–34.0)
MCHC: 33.3 g/dL (ref 32.0–36.0)
MCV: 89 fL (ref 80–100)
Monocyte #: 2.2 x10 3/mm — ABNORMAL HIGH (ref 0.2–0.9)
Monocyte %: 9.7 %
Neutrophil #: 16.6 10*3/uL — ABNORMAL HIGH (ref 1.4–6.5)
Neutrophil %: 73.4 %
Platelet: 504 10*3/uL — ABNORMAL HIGH (ref 150–440)
RBC: 3.41 10*6/uL — ABNORMAL LOW (ref 3.80–5.20)
RDW: 15.6 % — ABNORMAL HIGH (ref 11.5–14.5)
WBC: 23.6 10*3/uL — ABNORMAL HIGH (ref 3.6–11.0)

## 2013-04-28 LAB — BASIC METABOLIC PANEL
Anion Gap: 12 (ref 7–16)
BUN: 39 mg/dL — ABNORMAL HIGH (ref 7–18)
Calcium, Total: 8.6 mg/dL (ref 8.5–10.1)
Chloride: 86 mmol/L — ABNORMAL LOW (ref 98–107)
Co2: 27 mmol/L (ref 21–32)
Creatinine: 9.78 mg/dL — ABNORMAL HIGH (ref 0.60–1.30)
EGFR (African American): 5 — ABNORMAL LOW
EGFR (Non-African Amer.): 4 — ABNORMAL LOW
Glucose: 92 mg/dL (ref 65–99)
Osmolality: 261 (ref 275–301)
Potassium: 4.5 mmol/L (ref 3.5–5.1)
Sodium: 125 mmol/L — ABNORMAL LOW (ref 136–145)

## 2013-04-29 LAB — BASIC METABOLIC PANEL
Anion Gap: 9 (ref 7–16)
BUN: 19 mg/dL — ABNORMAL HIGH (ref 7–18)
Calcium, Total: 8.8 mg/dL (ref 8.5–10.1)
Chloride: 96 mmol/L — ABNORMAL LOW (ref 98–107)
Co2: 27 mmol/L (ref 21–32)
Creatinine: 5.73 mg/dL — ABNORMAL HIGH (ref 0.60–1.30)
EGFR (African American): 10 — ABNORMAL LOW
EGFR (Non-African Amer.): 8 — ABNORMAL LOW
Glucose: 107 mg/dL — ABNORMAL HIGH (ref 65–99)
Osmolality: 267 (ref 275–301)
Potassium: 4 mmol/L (ref 3.5–5.1)
Sodium: 132 mmol/L — ABNORMAL LOW (ref 136–145)

## 2013-04-29 LAB — CBC WITH DIFFERENTIAL/PLATELET
Basophil #: 0.2 10*3/uL — ABNORMAL HIGH (ref 0.0–0.1)
Basophil %: 0.9 %
Eosinophil #: 0.3 10*3/uL (ref 0.0–0.7)
Eosinophil %: 1.6 %
HCT: 30.9 % — ABNORMAL LOW (ref 35.0–47.0)
HGB: 10.1 g/dL — ABNORMAL LOW (ref 12.0–16.0)
Lymphocyte #: 2 10*3/uL (ref 1.0–3.6)
Lymphocyte %: 9.2 %
MCH: 29.2 pg (ref 26.0–34.0)
MCHC: 32.8 g/dL (ref 32.0–36.0)
MCV: 89 fL (ref 80–100)
Monocyte #: 1.8 x10 3/mm — ABNORMAL HIGH (ref 0.2–0.9)
Monocyte %: 8.5 %
Neutrophil #: 17.2 10*3/uL — ABNORMAL HIGH (ref 1.4–6.5)
Neutrophil %: 79.8 %
Platelet: 393 10*3/uL (ref 150–440)
RBC: 3.48 10*6/uL — ABNORMAL LOW (ref 3.80–5.20)
RDW: 15.2 % — ABNORMAL HIGH (ref 11.5–14.5)
WBC: 21.6 10*3/uL — ABNORMAL HIGH (ref 3.6–11.0)

## 2013-04-30 LAB — BASIC METABOLIC PANEL
Anion Gap: 11 (ref 7–16)
BUN: 26 mg/dL — ABNORMAL HIGH (ref 7–18)
Calcium, Total: 7.9 mg/dL — ABNORMAL LOW (ref 8.5–10.1)
Chloride: 95 mmol/L — ABNORMAL LOW (ref 98–107)
Co2: 25 mmol/L (ref 21–32)
Creatinine: 7.05 mg/dL — ABNORMAL HIGH (ref 0.60–1.30)
EGFR (African American): 8 — ABNORMAL LOW
EGFR (Non-African Amer.): 7 — ABNORMAL LOW
Glucose: 158 mg/dL — ABNORMAL HIGH (ref 65–99)
Osmolality: 271 (ref 275–301)
Potassium: 4 mmol/L (ref 3.5–5.1)
Sodium: 131 mmol/L — ABNORMAL LOW (ref 136–145)

## 2013-04-30 LAB — WOUND CULTURE

## 2013-04-30 LAB — PHOSPHORUS: Phosphorus: 6.8 mg/dL — ABNORMAL HIGH (ref 2.5–4.9)

## 2013-04-30 LAB — CBC WITH DIFFERENTIAL/PLATELET
Bands: 2 %
Eosinophil: 6 %
HCT: 27.6 % — ABNORMAL LOW (ref 35.0–47.0)
HGB: 9.1 g/dL — ABNORMAL LOW (ref 12.0–16.0)
Lymphocytes: 13 %
MCH: 29.1 pg (ref 26.0–34.0)
MCHC: 33 g/dL (ref 32.0–36.0)
MCV: 88 fL (ref 80–100)
Metamyelocyte: 2 %
Monocytes: 9 %
Platelet: 334 10*3/uL (ref 150–440)
RBC: 3.12 10*6/uL — ABNORMAL LOW (ref 3.80–5.20)
RDW: 15.4 % — ABNORMAL HIGH (ref 11.5–14.5)
Segmented Neutrophils: 68 %
WBC: 17.3 10*3/uL — ABNORMAL HIGH (ref 3.6–11.0)

## 2013-05-01 LAB — CBC WITH DIFFERENTIAL/PLATELET
Bands: 3 %
Comment - H1-Com3: NORMAL
Eosinophil: 1 %
HCT: 26.8 % — ABNORMAL LOW (ref 35.0–47.0)
HGB: 8.8 g/dL — ABNORMAL LOW (ref 12.0–16.0)
Lymphocytes: 11 %
MCH: 29.3 pg (ref 26.0–34.0)
MCHC: 32.9 g/dL (ref 32.0–36.0)
MCV: 89 fL (ref 80–100)
Metamyelocyte: 1 %
Monocytes: 6 %
Myelocyte: 2 %
Platelet: 328 10*3/uL (ref 150–440)
RBC: 3 10*6/uL — ABNORMAL LOW (ref 3.80–5.20)
RDW: 15.5 % — ABNORMAL HIGH (ref 11.5–14.5)
Segmented Neutrophils: 76 %
WBC: 15.7 10*3/uL — ABNORMAL HIGH (ref 3.6–11.0)

## 2013-05-01 LAB — CULTURE, BLOOD (SINGLE)

## 2013-05-01 LAB — BASIC METABOLIC PANEL
Anion Gap: 7 (ref 7–16)
BUN: 15 mg/dL (ref 7–18)
Calcium, Total: 8.8 mg/dL (ref 8.5–10.1)
Chloride: 96 mmol/L — ABNORMAL LOW (ref 98–107)
Co2: 30 mmol/L (ref 21–32)
Creatinine: 5.78 mg/dL — ABNORMAL HIGH (ref 0.60–1.30)
EGFR (African American): 10 — ABNORMAL LOW
EGFR (Non-African Amer.): 8 — ABNORMAL LOW
Glucose: 89 mg/dL (ref 65–99)
Osmolality: 267 (ref 275–301)
Potassium: 4 mmol/L (ref 3.5–5.1)
Sodium: 133 mmol/L — ABNORMAL LOW (ref 136–145)

## 2013-05-01 LAB — WOUND CULTURE

## 2013-05-02 LAB — PHOSPHORUS: Phosphorus: 5.2 mg/dL — ABNORMAL HIGH (ref 2.5–4.9)

## 2013-05-02 LAB — BASIC METABOLIC PANEL WITH GFR
Anion Gap: 8
BUN: 25 mg/dL — ABNORMAL HIGH
Calcium, Total: 8.7 mg/dL
Chloride: 95 mmol/L — ABNORMAL LOW
Co2: 31 mmol/L
Creatinine: 7.75 mg/dL — ABNORMAL HIGH
EGFR (African American): 7 — ABNORMAL LOW
EGFR (Non-African Amer.): 6 — ABNORMAL LOW
Glucose: 79 mg/dL
Osmolality: 272
Potassium: 4.3 mmol/L
Sodium: 134 mmol/L — ABNORMAL LOW

## 2013-05-02 LAB — WBC: WBC: 13.5 10*3/uL — ABNORMAL HIGH (ref 3.6–11.0)

## 2013-05-03 LAB — CBC WITH DIFFERENTIAL/PLATELET
Basophil #: 0.1 10*3/uL (ref 0.0–0.1)
Basophil %: 0.7 %
Eosinophil #: 0.3 10*3/uL (ref 0.0–0.7)
Eosinophil %: 2.3 %
HCT: 22.9 % — ABNORMAL LOW (ref 35.0–47.0)
HGB: 7.5 g/dL — ABNORMAL LOW (ref 12.0–16.0)
Lymphocyte #: 2.1 10*3/uL (ref 1.0–3.6)
Lymphocyte %: 14.8 %
MCH: 29.5 pg (ref 26.0–34.0)
MCHC: 32.8 g/dL (ref 32.0–36.0)
MCV: 90 fL (ref 80–100)
Monocyte #: 1.2 x10 3/mm — ABNORMAL HIGH (ref 0.2–0.9)
Monocyte %: 8.5 %
Neutrophil #: 10.2 10*3/uL — ABNORMAL HIGH (ref 1.4–6.5)
Neutrophil %: 73.7 %
Platelet: 255 10*3/uL (ref 150–440)
RBC: 2.55 10*6/uL — ABNORMAL LOW (ref 3.80–5.20)
RDW: 16.2 % — ABNORMAL HIGH (ref 11.5–14.5)
WBC: 13.9 10*3/uL — ABNORMAL HIGH (ref 3.6–11.0)

## 2013-05-04 LAB — CBC WITH DIFFERENTIAL/PLATELET
Bands: 7 %
Comment - H1-Com3: NORMAL
Eosinophil: 2 %
HCT: 24.4 % — ABNORMAL LOW (ref 35.0–47.0)
HGB: 7.9 g/dL — ABNORMAL LOW (ref 12.0–16.0)
Lymphocytes: 12 %
MCH: 29.1 pg (ref 26.0–34.0)
MCHC: 32.3 g/dL (ref 32.0–36.0)
MCV: 90 fL (ref 80–100)
Metamyelocyte: 3 %
Monocytes: 3 %
Myelocyte: 2 %
Platelet: 255 10*3/uL (ref 150–440)
RBC: 2.71 10*6/uL — ABNORMAL LOW (ref 3.80–5.20)
RDW: 15.9 % — ABNORMAL HIGH (ref 11.5–14.5)
Segmented Neutrophils: 71 %
WBC: 14.2 10*3/uL — ABNORMAL HIGH (ref 3.6–11.0)

## 2013-05-05 LAB — PHOSPHORUS: Phosphorus: 6.3 mg/dL — ABNORMAL HIGH (ref 2.5–4.9)

## 2013-05-05 LAB — CBC WITH DIFFERENTIAL/PLATELET
Basophil #: 0.1 10*3/uL (ref 0.0–0.1)
Basophil %: 0.9 %
Eosinophil #: 0.3 10*3/uL (ref 0.0–0.7)
Eosinophil %: 2.6 %
HCT: 22.1 % — ABNORMAL LOW (ref 35.0–47.0)
HGB: 7.2 g/dL — ABNORMAL LOW (ref 12.0–16.0)
Lymphocyte #: 1.5 10*3/uL (ref 1.0–3.6)
Lymphocyte %: 11.8 %
MCH: 29.5 pg (ref 26.0–34.0)
MCHC: 32.5 g/dL (ref 32.0–36.0)
MCV: 91 fL (ref 80–100)
Monocyte #: 1.3 x10 3/mm — ABNORMAL HIGH (ref 0.2–0.9)
Monocyte %: 10 %
Neutrophil #: 9.6 10*3/uL — ABNORMAL HIGH (ref 1.4–6.5)
Neutrophil %: 74.7 %
Platelet: 242 10*3/uL (ref 150–440)
RBC: 2.44 10*6/uL — ABNORMAL LOW (ref 3.80–5.20)
RDW: 15.8 % — ABNORMAL HIGH (ref 11.5–14.5)
WBC: 12.8 10*3/uL — ABNORMAL HIGH (ref 3.6–11.0)

## 2013-05-06 LAB — CBC WITH DIFFERENTIAL/PLATELET
Bands: 2 %
Eosinophil: 2 %
HCT: 25.1 % — ABNORMAL LOW (ref 35.0–47.0)
HGB: 8.1 g/dL — ABNORMAL LOW (ref 12.0–16.0)
Lymphocytes: 10 %
MCH: 29.5 pg (ref 26.0–34.0)
MCHC: 32.3 g/dL (ref 32.0–36.0)
MCV: 91 fL (ref 80–100)
Metamyelocyte: 1 %
Monocytes: 5 %
Myelocyte: 1 %
Platelet: 266 10*3/uL (ref 150–440)
RBC: 2.75 10*6/uL — ABNORMAL LOW (ref 3.80–5.20)
RDW: 16.2 % — ABNORMAL HIGH (ref 11.5–14.5)
Segmented Neutrophils: 79 %
WBC: 13.2 10*3/uL — ABNORMAL HIGH (ref 3.6–11.0)

## 2013-05-07 LAB — CBC WITH DIFFERENTIAL/PLATELET
Bands: 2 %
Comment - H1-Com4: NORMAL
Eosinophil: 3 %
HCT: 23.9 % — ABNORMAL LOW (ref 35.0–47.0)
HGB: 7.9 g/dL — ABNORMAL LOW (ref 12.0–16.0)
Lymphocytes: 22 %
MCH: 29.8 pg (ref 26.0–34.0)
MCHC: 33.1 g/dL (ref 32.0–36.0)
MCV: 90 fL (ref 80–100)
Metamyelocyte: 2 %
Monocytes: 10 %
Myelocyte: 1 %
Platelet: 267 10*3/uL (ref 150–440)
RBC: 2.65 10*6/uL — ABNORMAL LOW (ref 3.80–5.20)
RDW: 16.4 % — ABNORMAL HIGH (ref 11.5–14.5)
Segmented Neutrophils: 60 %
WBC: 10.6 10*3/uL (ref 3.6–11.0)

## 2013-05-07 LAB — PHOSPHORUS: Phosphorus: 5.7 mg/dL — ABNORMAL HIGH (ref 2.5–4.9)

## 2013-05-08 LAB — CBC WITH DIFFERENTIAL/PLATELET
Basophil #: 0.1 10*3/uL (ref 0.0–0.1)
Basophil %: 0.8 %
Eosinophil #: 0.2 10*3/uL (ref 0.0–0.7)
Eosinophil %: 1.7 %
HCT: 23.2 % — ABNORMAL LOW (ref 35.0–47.0)
HGB: 7.6 g/dL — ABNORMAL LOW (ref 12.0–16.0)
Lymphocyte #: 1.3 10*3/uL (ref 1.0–3.6)
Lymphocyte %: 10.6 %
MCH: 29.4 pg (ref 26.0–34.0)
MCHC: 32.7 g/dL (ref 32.0–36.0)
MCV: 90 fL (ref 80–100)
Monocyte #: 1.6 x10 3/mm — ABNORMAL HIGH (ref 0.2–0.9)
Monocyte %: 13 %
Neutrophil #: 9.1 10*3/uL — ABNORMAL HIGH (ref 1.4–6.5)
Neutrophil %: 73.9 %
Platelet: 280 10*3/uL (ref 150–440)
RBC: 2.58 10*6/uL — ABNORMAL LOW (ref 3.80–5.20)
RDW: 16.1 % — ABNORMAL HIGH (ref 11.5–14.5)
WBC: 12.4 10*3/uL — ABNORMAL HIGH (ref 3.6–11.0)

## 2013-05-09 LAB — CBC WITH DIFFERENTIAL/PLATELET
Basophil #: 0.1 10*3/uL (ref 0.0–0.1)
Basophil %: 1.2 %
Eosinophil #: 0.3 10*3/uL (ref 0.0–0.7)
Eosinophil %: 2.3 %
HCT: 22.5 % — ABNORMAL LOW (ref 35.0–47.0)
HGB: 7.3 g/dL — ABNORMAL LOW (ref 12.0–16.0)
Lymphocyte #: 1.2 10*3/uL (ref 1.0–3.6)
Lymphocyte %: 10.7 %
MCH: 29.5 pg (ref 26.0–34.0)
MCHC: 32.6 g/dL (ref 32.0–36.0)
MCV: 90 fL (ref 80–100)
Monocyte #: 1.4 x10 3/mm — ABNORMAL HIGH (ref 0.2–0.9)
Monocyte %: 11.7 %
Neutrophil #: 8.6 10*3/uL — ABNORMAL HIGH (ref 1.4–6.5)
Neutrophil %: 74.1 %
Platelet: 284 10*3/uL (ref 150–440)
RBC: 2.49 10*6/uL — ABNORMAL LOW (ref 3.80–5.20)
RDW: 16.3 % — ABNORMAL HIGH (ref 11.5–14.5)
WBC: 11.6 10*3/uL — ABNORMAL HIGH (ref 3.6–11.0)

## 2013-05-09 LAB — PHOSPHORUS: Phosphorus: 5.8 mg/dL — ABNORMAL HIGH (ref 2.5–4.9)

## 2013-05-11 LAB — CBC WITH DIFFERENTIAL/PLATELET
Basophil #: 0.1 10*3/uL (ref 0.0–0.1)
Basophil %: 1.3 %
Eosinophil #: 0.2 10*3/uL (ref 0.0–0.7)
Eosinophil %: 2 %
HCT: 26.3 % — ABNORMAL LOW (ref 35.0–47.0)
HGB: 8.7 g/dL — ABNORMAL LOW (ref 12.0–16.0)
Lymphocyte #: 0.9 10*3/uL — ABNORMAL LOW (ref 1.0–3.6)
Lymphocyte %: 11.2 %
MCH: 29.6 pg (ref 26.0–34.0)
MCHC: 32.9 g/dL (ref 32.0–36.0)
MCV: 90 fL (ref 80–100)
Monocyte #: 1.1 x10 3/mm — ABNORMAL HIGH (ref 0.2–0.9)
Monocyte %: 13.6 %
Neutrophil #: 6 10*3/uL (ref 1.4–6.5)
Neutrophil %: 71.9 %
Platelet: 268 10*3/uL (ref 150–440)
RBC: 2.92 10*6/uL — ABNORMAL LOW (ref 3.80–5.20)
RDW: 16.4 % — ABNORMAL HIGH (ref 11.5–14.5)
WBC: 8.4 10*3/uL (ref 3.6–11.0)

## 2013-05-11 LAB — BASIC METABOLIC PANEL
Anion Gap: 9 (ref 7–16)
BUN: 29 mg/dL — ABNORMAL HIGH (ref 7–18)
Calcium, Total: 8.8 mg/dL (ref 8.5–10.1)
Chloride: 96 mmol/L — ABNORMAL LOW (ref 98–107)
Co2: 29 mmol/L (ref 21–32)
Creatinine: 7.9 mg/dL — ABNORMAL HIGH (ref 0.60–1.30)
EGFR (African American): 7 — ABNORMAL LOW
EGFR (Non-African Amer.): 6 — ABNORMAL LOW
Glucose: 114 mg/dL — ABNORMAL HIGH (ref 65–99)
Osmolality: 275 (ref 275–301)
Potassium: 3.6 mmol/L (ref 3.5–5.1)
Sodium: 134 mmol/L — ABNORMAL LOW (ref 136–145)

## 2013-05-12 LAB — PHOSPHORUS: Phosphorus: 4.7 mg/dL (ref 2.5–4.9)

## 2013-05-14 LAB — CBC WITH DIFFERENTIAL/PLATELET
Basophil #: 0.1 10*3/uL (ref 0.0–0.1)
Basophil %: 2.1 %
Eosinophil #: 0.3 10*3/uL (ref 0.0–0.7)
Eosinophil %: 6.2 %
HCT: 26.4 % — ABNORMAL LOW (ref 35.0–47.0)
HGB: 8.6 g/dL — ABNORMAL LOW (ref 12.0–16.0)
Lymphocyte #: 1.2 10*3/uL (ref 1.0–3.6)
Lymphocyte %: 22.2 %
MCH: 29.5 pg (ref 26.0–34.0)
MCHC: 32.4 g/dL (ref 32.0–36.0)
MCV: 91 fL (ref 80–100)
Monocyte #: 1.2 x10 3/mm — ABNORMAL HIGH (ref 0.2–0.9)
Monocyte %: 22.1 %
Neutrophil #: 2.6 10*3/uL (ref 1.4–6.5)
Neutrophil %: 47.4 %
Platelet: 235 10*3/uL (ref 150–440)
RBC: 2.9 10*6/uL — ABNORMAL LOW (ref 3.80–5.20)
RDW: 16.6 % — ABNORMAL HIGH (ref 11.5–14.5)
WBC: 5.5 10*3/uL (ref 3.6–11.0)

## 2013-05-14 LAB — BASIC METABOLIC PANEL
Anion Gap: 7 (ref 7–16)
BUN: 31 mg/dL — ABNORMAL HIGH (ref 7–18)
Calcium, Total: 8.6 mg/dL (ref 8.5–10.1)
Chloride: 101 mmol/L (ref 98–107)
Co2: 31 mmol/L (ref 21–32)
Creatinine: 8.85 mg/dL — ABNORMAL HIGH (ref 0.60–1.30)
EGFR (African American): 6 — ABNORMAL LOW
EGFR (Non-African Amer.): 5 — ABNORMAL LOW
Glucose: 98 mg/dL (ref 65–99)
Osmolality: 284 (ref 275–301)
Potassium: 3.8 mmol/L (ref 3.5–5.1)
Sodium: 139 mmol/L (ref 136–145)

## 2013-05-14 LAB — CULTURE, BLOOD (SINGLE)

## 2013-05-16 LAB — PHOSPHORUS: Phosphorus: 1.6 mg/dL — ABNORMAL LOW (ref 2.5–4.9)

## 2013-05-19 LAB — CBC WITH DIFFERENTIAL/PLATELET
Basophil #: 0.1 10*3/uL (ref 0.0–0.1)
Basophil %: 2.1 %
Eosinophil #: 0.5 10*3/uL (ref 0.0–0.7)
Eosinophil %: 8.6 %
HCT: 27.3 % — ABNORMAL LOW (ref 35.0–47.0)
HGB: 9.1 g/dL — ABNORMAL LOW (ref 12.0–16.0)
Lymphocyte #: 0.9 10*3/uL — ABNORMAL LOW (ref 1.0–3.6)
Lymphocyte %: 17.1 %
MCH: 29.7 pg (ref 26.0–34.0)
MCHC: 33.2 g/dL (ref 32.0–36.0)
MCV: 89 fL (ref 80–100)
Monocyte #: 0.9 x10 3/mm (ref 0.2–0.9)
Monocyte %: 17.2 %
Neutrophil #: 2.9 10*3/uL (ref 1.4–6.5)
Neutrophil %: 55 %
Platelet: 340 10*3/uL (ref 150–440)
RBC: 3.05 10*6/uL — ABNORMAL LOW (ref 3.80–5.20)
RDW: 16.8 % — ABNORMAL HIGH (ref 11.5–14.5)
WBC: 5.3 10*3/uL (ref 3.6–11.0)

## 2013-05-19 LAB — PHOSPHORUS: Phosphorus: 5.7 mg/dL — ABNORMAL HIGH (ref 2.5–4.9)

## 2013-05-21 LAB — PHOSPHORUS: Phosphorus: 6.7 mg/dL — ABNORMAL HIGH (ref 2.5–4.9)

## 2013-05-26 ENCOUNTER — Ambulatory Visit: Payer: Self-pay | Admitting: Oncology

## 2013-05-27 ENCOUNTER — Ambulatory Visit: Payer: Self-pay | Admitting: Vascular Surgery

## 2013-06-03 ENCOUNTER — Ambulatory Visit: Payer: Self-pay | Admitting: Vascular Surgery

## 2013-06-10 ENCOUNTER — Ambulatory Visit: Payer: Self-pay | Admitting: Vascular Surgery

## 2013-06-17 ENCOUNTER — Ambulatory Visit: Payer: Self-pay | Admitting: Vascular Surgery

## 2013-06-24 ENCOUNTER — Ambulatory Visit: Payer: Self-pay | Admitting: Vascular Surgery

## 2013-07-01 ENCOUNTER — Ambulatory Visit: Payer: Self-pay | Admitting: Vascular Surgery

## 2013-07-08 ENCOUNTER — Ambulatory Visit: Payer: Self-pay | Admitting: Vascular Surgery

## 2013-07-08 LAB — POTASSIUM: Potassium: 4.1 mmol/L (ref 3.5–5.1)

## 2013-07-15 ENCOUNTER — Ambulatory Visit: Payer: Self-pay | Admitting: Vascular Surgery

## 2013-07-15 LAB — POTASSIUM: Potassium: 4 mmol/L (ref 3.5–5.1)

## 2013-07-22 ENCOUNTER — Ambulatory Visit: Payer: Self-pay | Admitting: Vascular Surgery

## 2013-07-28 ENCOUNTER — Inpatient Hospital Stay: Payer: Self-pay | Admitting: Vascular Surgery

## 2013-07-28 LAB — BASIC METABOLIC PANEL
Anion Gap: 8 (ref 7–16)
BUN: 43 mg/dL — ABNORMAL HIGH (ref 7–18)
Calcium, Total: 7.8 mg/dL — ABNORMAL LOW (ref 8.5–10.1)
Chloride: 97 mmol/L — ABNORMAL LOW (ref 98–107)
Co2: 30 mmol/L (ref 21–32)
Creatinine: 10.98 mg/dL — ABNORMAL HIGH (ref 0.60–1.30)
EGFR (African American): 4 — ABNORMAL LOW
EGFR (Non-African Amer.): 4 — ABNORMAL LOW
Glucose: 85 mg/dL (ref 65–99)
Osmolality: 280 (ref 275–301)
Potassium: 5.6 mmol/L — ABNORMAL HIGH (ref 3.5–5.1)
Sodium: 135 mmol/L — ABNORMAL LOW (ref 136–145)

## 2013-07-28 LAB — CBC WITH DIFFERENTIAL/PLATELET
Basophil #: 0.2 10*3/uL — ABNORMAL HIGH (ref 0.0–0.1)
Basophil %: 3.1 %
Eosinophil #: 0.4 10*3/uL (ref 0.0–0.7)
Eosinophil %: 5.8 %
HCT: 31.1 % — ABNORMAL LOW (ref 35.0–47.0)
HGB: 10.2 g/dL — ABNORMAL LOW (ref 12.0–16.0)
Lymphocyte #: 1.2 10*3/uL (ref 1.0–3.6)
Lymphocyte %: 16.4 %
MCH: 28.9 pg (ref 26.0–34.0)
MCHC: 32.7 g/dL (ref 32.0–36.0)
MCV: 89 fL (ref 80–100)
Monocyte #: 0.9 x10 3/mm (ref 0.2–0.9)
Monocyte %: 11.8 %
Neutrophil #: 4.8 10*3/uL (ref 1.4–6.5)
Neutrophil %: 62.9 %
Platelet: 243 10*3/uL (ref 150–440)
RBC: 3.51 10*6/uL — ABNORMAL LOW (ref 3.80–5.20)
RDW: 17.4 % — ABNORMAL HIGH (ref 11.5–14.5)
WBC: 7.6 10*3/uL (ref 3.6–11.0)

## 2013-07-28 LAB — BODY FLUID CULTURE

## 2013-07-28 LAB — WOUND CULTURE

## 2013-07-29 LAB — BASIC METABOLIC PANEL
Anion Gap: 9 (ref 7–16)
BUN: 47 mg/dL — ABNORMAL HIGH (ref 7–18)
Calcium, Total: 7.5 mg/dL — ABNORMAL LOW (ref 8.5–10.1)
Chloride: 99 mmol/L (ref 98–107)
Co2: 26 mmol/L (ref 21–32)
Creatinine: 11.66 mg/dL — ABNORMAL HIGH (ref 0.60–1.30)
EGFR (African American): 4 — ABNORMAL LOW
EGFR (Non-African Amer.): 4 — ABNORMAL LOW
Glucose: 90 mg/dL (ref 65–99)
Osmolality: 280 (ref 275–301)
Potassium: 6 mmol/L — ABNORMAL HIGH (ref 3.5–5.1)
Sodium: 134 mmol/L — ABNORMAL LOW (ref 136–145)

## 2013-07-29 LAB — CBC WITH DIFFERENTIAL/PLATELET
Basophil #: 0.1 10*3/uL (ref 0.0–0.1)
Basophil #: 0.1 10*3/uL (ref 0.0–0.1)
Basophil %: 1 %
Basophil %: 1.1 %
Eosinophil #: 0.1 10*3/uL (ref 0.0–0.7)
Eosinophil #: 0.3 10*3/uL (ref 0.0–0.7)
Eosinophil %: 1.1 %
Eosinophil %: 3.3 %
HCT: 27.4 % — ABNORMAL LOW (ref 35.0–47.0)
HCT: 28.1 % — ABNORMAL LOW (ref 35.0–47.0)
HGB: 8.9 g/dL — ABNORMAL LOW (ref 12.0–16.0)
HGB: 9.1 g/dL — ABNORMAL LOW (ref 12.0–16.0)
Lymphocyte #: 0.6 10*3/uL — ABNORMAL LOW (ref 1.0–3.6)
Lymphocyte #: 0.5 10*3/uL — ABNORMAL LOW (ref 1.0–3.6)
Lymphocyte %: 6 %
Lymphocyte %: 6.7 %
MCH: 29.1 pg (ref 26.0–34.0)
MCH: 28.8 pg (ref 26.0–34.0)
MCHC: 32.6 g/dL (ref 32.0–36.0)
MCHC: 32.6 g/dL (ref 32.0–36.0)
MCV: 89 fL (ref 80–100)
MCV: 89 fL (ref 80–100)
Monocyte #: 1.1 x10 3/mm — ABNORMAL HIGH (ref 0.2–0.9)
Monocyte #: 0.7 x10 3/mm (ref 0.2–0.9)
Monocyte %: 10.2 %
Monocyte %: 9.1 %
Neutrophil #: 8.6 10*3/uL — ABNORMAL HIGH (ref 1.4–6.5)
Neutrophil #: 6.1 10*3/uL (ref 1.4–6.5)
Neutrophil %: 81.7 %
Neutrophil %: 79.8 %
Platelet: 195 10*3/uL (ref 150–440)
Platelet: 214 10*3/uL (ref 150–440)
RBC: 3.07 10*6/uL — ABNORMAL LOW (ref 3.80–5.20)
RBC: 3.17 10*6/uL — ABNORMAL LOW (ref 3.80–5.20)
RDW: 17.8 % — ABNORMAL HIGH (ref 11.5–14.5)
RDW: 17.8 % — ABNORMAL HIGH (ref 11.5–14.5)
WBC: 10.6 10*3/uL (ref 3.6–11.0)
WBC: 7.7 10*3/uL (ref 3.6–11.0)

## 2013-07-29 LAB — PHOSPHORUS: Phosphorus: 9.1 mg/dL — ABNORMAL HIGH (ref 2.5–4.9)

## 2013-07-29 LAB — APTT: Activated PTT: 39.5 secs — ABNORMAL HIGH (ref 23.6–35.9)

## 2013-07-30 LAB — CBC WITH DIFFERENTIAL/PLATELET
Basophil #: 0.1 10*3/uL (ref 0.0–0.1)
Basophil %: 1.9 %
Eosinophil #: 0.4 10*3/uL (ref 0.0–0.7)
Eosinophil %: 5.3 %
HCT: 26.3 % — ABNORMAL LOW (ref 35.0–47.0)
HGB: 8.5 g/dL — ABNORMAL LOW (ref 12.0–16.0)
Lymphocyte #: 0.5 10*3/uL — ABNORMAL LOW (ref 1.0–3.6)
Lymphocyte %: 6.6 %
MCH: 28.9 pg (ref 26.0–34.0)
MCHC: 32.3 g/dL (ref 32.0–36.0)
MCV: 89 fL (ref 80–100)
Monocyte #: 0.7 x10 3/mm (ref 0.2–0.9)
Monocyte %: 8.8 %
Neutrophil #: 5.9 10*3/uL (ref 1.4–6.5)
Neutrophil %: 77.4 %
Platelet: 209 10*3/uL (ref 150–440)
RBC: 2.94 10*6/uL — ABNORMAL LOW (ref 3.80–5.20)
RDW: 17.9 % — ABNORMAL HIGH (ref 11.5–14.5)
WBC: 7.6 10*3/uL (ref 3.6–11.0)

## 2013-07-30 LAB — BASIC METABOLIC PANEL
Anion Gap: 7 (ref 7–16)
BUN: 25 mg/dL — ABNORMAL HIGH (ref 7–18)
Calcium, Total: 8.2 mg/dL — ABNORMAL LOW (ref 8.5–10.1)
Chloride: 97 mmol/L — ABNORMAL LOW (ref 98–107)
Co2: 31 mmol/L (ref 21–32)
Creatinine: 7.66 mg/dL — ABNORMAL HIGH (ref 0.60–1.30)
EGFR (African American): 7 — ABNORMAL LOW
EGFR (Non-African Amer.): 6 — ABNORMAL LOW
Glucose: 83 mg/dL (ref 65–99)
Osmolality: 274 (ref 275–301)
Potassium: 4.4 mmol/L (ref 3.5–5.1)
Sodium: 135 mmol/L — ABNORMAL LOW (ref 136–145)

## 2013-07-30 LAB — PHOSPHORUS: Phosphorus: 7.7 mg/dL — ABNORMAL HIGH (ref 2.5–4.9)

## 2013-07-30 LAB — APTT
Activated PTT: 126.1 secs — ABNORMAL HIGH (ref 23.6–35.9)
Activated PTT: >160 secs (ref 23.6–35.9)
Activated PTT: >160 secs (ref 23.6–35.9)

## 2013-07-30 LAB — PATHOLOGY REPORT

## 2013-07-31 LAB — CBC WITH DIFFERENTIAL/PLATELET
Basophil #: 0.1 10*3/uL (ref 0.0–0.1)
Basophil %: 2.3 %
Eosinophil #: 0.4 10*3/uL (ref 0.0–0.7)
Eosinophil %: 5.9 %
HCT: 25.2 % — ABNORMAL LOW (ref 35.0–47.0)
HGB: 8.3 g/dL — ABNORMAL LOW (ref 12.0–16.0)
Lymphocyte #: 0.7 10*3/uL — ABNORMAL LOW (ref 1.0–3.6)
Lymphocyte %: 12.1 %
MCH: 29 pg (ref 26.0–34.0)
MCHC: 33 g/dL (ref 32.0–36.0)
MCV: 88 fL (ref 80–100)
Monocyte #: 0.8 x10 3/mm (ref 0.2–0.9)
Monocyte %: 13.1 %
Neutrophil #: 4 10*3/uL (ref 1.4–6.5)
Neutrophil %: 66.6 %
Platelet: 195 10*3/uL (ref 150–440)
RBC: 2.87 10*6/uL — ABNORMAL LOW (ref 3.80–5.20)
RDW: 17.6 % — ABNORMAL HIGH (ref 11.5–14.5)
WBC: 6 10*3/uL (ref 3.6–11.0)

## 2013-07-31 LAB — BASIC METABOLIC PANEL
Anion Gap: 5 — ABNORMAL LOW (ref 7–16)
BUN: 16 mg/dL (ref 7–18)
Calcium, Total: 7.9 mg/dL — ABNORMAL LOW (ref 8.5–10.1)
Chloride: 99 mmol/L (ref 98–107)
Co2: 33 mmol/L — ABNORMAL HIGH (ref 21–32)
Creatinine: 5.78 mg/dL — ABNORMAL HIGH (ref 0.60–1.30)
EGFR (African American): 10 — ABNORMAL LOW
EGFR (Non-African Amer.): 8 — ABNORMAL LOW
Glucose: 84 mg/dL (ref 65–99)
Osmolality: 274 (ref 275–301)
Potassium: 3.9 mmol/L (ref 3.5–5.1)
Sodium: 137 mmol/L (ref 136–145)

## 2013-07-31 LAB — APTT: Activated PTT: 102.3 s — ABNORMAL HIGH

## 2013-08-01 LAB — APTT
Activated PTT: 116.7 secs — ABNORMAL HIGH (ref 23.6–35.9)
Activated PTT: 87.1 secs — ABNORMAL HIGH (ref 23.6–35.9)

## 2013-08-01 LAB — CBC WITH DIFFERENTIAL/PLATELET
Basophil #: 0.2 10*3/uL — ABNORMAL HIGH (ref 0.0–0.1)
Basophil %: 3.3 %
Eosinophil #: 0.3 10*3/uL (ref 0.0–0.7)
Eosinophil %: 6.6 %
HCT: 26.6 % — ABNORMAL LOW (ref 35.0–47.0)
HGB: 8.7 g/dL — ABNORMAL LOW (ref 12.0–16.0)
Lymphocyte #: 0.8 10*3/uL — ABNORMAL LOW (ref 1.0–3.6)
Lymphocyte %: 15 %
MCH: 28.6 pg (ref 26.0–34.0)
MCHC: 32.6 g/dL (ref 32.0–36.0)
MCV: 88 fL (ref 80–100)
Monocyte #: 0.6 x10 3/mm (ref 0.2–0.9)
Monocyte %: 12.7 %
Neutrophil #: 3.2 10*3/uL (ref 1.4–6.5)
Neutrophil %: 62.4 %
Platelet: 259 10*3/uL (ref 150–440)
RBC: 3.04 10*6/uL — ABNORMAL LOW (ref 3.80–5.20)
RDW: 17.4 % — ABNORMAL HIGH (ref 11.5–14.5)
WBC: 5.1 10*3/uL (ref 3.6–11.0)

## 2013-08-01 LAB — RENAL FUNCTION PANEL
Albumin: 2.5 g/dL — ABNORMAL LOW (ref 3.4–5.0)
Anion Gap: 8 (ref 7–16)
BUN: 20 mg/dL — ABNORMAL HIGH (ref 7–18)
Calcium, Total: 8.5 mg/dL (ref 8.5–10.1)
Chloride: 97 mmol/L — ABNORMAL LOW (ref 98–107)
Co2: 30 mmol/L (ref 21–32)
Creatinine: 7.42 mg/dL — ABNORMAL HIGH (ref 0.60–1.30)
EGFR (African American): 7 — ABNORMAL LOW
EGFR (Non-African Amer.): 6 — ABNORMAL LOW
Glucose: 72 mg/dL (ref 65–99)
Osmolality: 271 (ref 275–301)
Phosphorus: 6 mg/dL — ABNORMAL HIGH (ref 2.5–4.9)
Potassium: 3.9 mmol/L (ref 3.5–5.1)
Sodium: 135 mmol/L — ABNORMAL LOW (ref 136–145)

## 2013-08-02 LAB — APTT
Activated PTT: 115.1 secs — ABNORMAL HIGH (ref 23.6–35.9)
Activated PTT: 71.5 secs — ABNORMAL HIGH (ref 23.6–35.9)

## 2013-08-03 LAB — APTT
Activated PTT: 47.1 secs — ABNORMAL HIGH (ref 23.6–35.9)
Activated PTT: 47.9 secs — ABNORMAL HIGH (ref 23.6–35.9)

## 2013-08-03 LAB — HEMOGLOBIN: HGB: 8.2 g/dL — ABNORMAL LOW (ref 12.0–16.0)

## 2013-08-03 LAB — PLATELET COUNT: Platelet: 279 10*3/uL (ref 150–440)

## 2013-08-19 ENCOUNTER — Ambulatory Visit: Payer: Self-pay | Admitting: Vascular Surgery

## 2013-10-28 DIAGNOSIS — R0989 Other specified symptoms and signs involving the circulatory and respiratory systems: Secondary | ICD-10-CM | POA: Insufficient documentation

## 2013-10-28 DIAGNOSIS — D689 Coagulation defect, unspecified: Secondary | ICD-10-CM | POA: Insufficient documentation

## 2013-10-28 DIAGNOSIS — R197 Diarrhea, unspecified: Secondary | ICD-10-CM | POA: Insufficient documentation

## 2013-11-24 ENCOUNTER — Ambulatory Visit (HOSPITAL_COMMUNITY)
Admission: RE | Admit: 2013-11-24 | Discharge: 2013-11-24 | Disposition: A | Discharge status: Home / Self Care | Payer: Medicare Other | Source: Ambulatory Visit | Attending: Nephrology | Admitting: Nephrology

## 2013-11-24 ENCOUNTER — Other Ambulatory Visit (HOSPITAL_COMMUNITY): Payer: Self-pay | Admitting: Nephrology

## 2013-11-24 DIAGNOSIS — M546 Pain in thoracic spine: Secondary | ICD-10-CM | POA: Insufficient documentation

## 2013-11-24 DIAGNOSIS — M549 Dorsalgia, unspecified: Secondary | ICD-10-CM

## 2013-12-22 ENCOUNTER — Other Ambulatory Visit (HOSPITAL_COMMUNITY): Payer: Self-pay | Admitting: Nephrology

## 2013-12-22 DIAGNOSIS — R1012 Left upper quadrant pain: Secondary | ICD-10-CM

## 2013-12-22 DIAGNOSIS — R52 Pain, unspecified: Secondary | ICD-10-CM

## 2013-12-23 ENCOUNTER — Ambulatory Visit (HOSPITAL_COMMUNITY)
Admission: RE | Admit: 2013-12-23 | Discharge: 2013-12-23 | Disposition: A | Discharge status: Home / Self Care | Payer: Medicare Other | Source: Ambulatory Visit | Attending: Nephrology | Admitting: Nephrology

## 2013-12-23 ENCOUNTER — Other Ambulatory Visit (HOSPITAL_COMMUNITY): Payer: Self-pay | Admitting: Nephrology

## 2013-12-23 DIAGNOSIS — K766 Portal hypertension: Secondary | ICD-10-CM | POA: Insufficient documentation

## 2013-12-23 DIAGNOSIS — R1012 Left upper quadrant pain: Secondary | ICD-10-CM

## 2013-12-23 DIAGNOSIS — I8390 Asymptomatic varicose veins of unspecified lower extremity: Secondary | ICD-10-CM | POA: Insufficient documentation

## 2014-01-04 ENCOUNTER — Ambulatory Visit: Payer: Self-pay | Admitting: Vascular Surgery

## 2014-01-05 ENCOUNTER — Ambulatory Visit: Payer: Self-pay | Admitting: Vascular Surgery

## 2014-01-16 DIAGNOSIS — D509 Iron deficiency anemia, unspecified: Secondary | ICD-10-CM | POA: Insufficient documentation

## 2014-02-03 ENCOUNTER — Ambulatory Visit: Payer: Self-pay

## 2014-02-17 ENCOUNTER — Ambulatory Visit: Payer: Self-pay | Admitting: Vascular Surgery

## 2014-03-10 ENCOUNTER — Encounter (HOSPITAL_BASED_OUTPATIENT_CLINIC_OR_DEPARTMENT_OTHER): Payer: Medicare Other | Attending: General Surgery

## 2014-03-10 DIAGNOSIS — Z79899 Other long term (current) drug therapy: Secondary | ICD-10-CM | POA: Insufficient documentation

## 2014-03-10 DIAGNOSIS — I739 Peripheral vascular disease, unspecified: Secondary | ICD-10-CM | POA: Insufficient documentation

## 2014-03-10 DIAGNOSIS — L98499 Non-pressure chronic ulcer of skin of other sites with unspecified severity: Secondary | ICD-10-CM | POA: Diagnosis not present

## 2014-03-10 DIAGNOSIS — L988 Other specified disorders of the skin and subcutaneous tissue: Secondary | ICD-10-CM | POA: Insufficient documentation

## 2014-03-10 DIAGNOSIS — L089 Local infection of the skin and subcutaneous tissue, unspecified: Secondary | ICD-10-CM | POA: Insufficient documentation

## 2014-03-10 DIAGNOSIS — Z7902 Long term (current) use of antithrombotics/antiplatelets: Secondary | ICD-10-CM | POA: Insufficient documentation

## 2014-03-10 DIAGNOSIS — Z992 Dependence on renal dialysis: Secondary | ICD-10-CM | POA: Insufficient documentation

## 2014-03-10 DIAGNOSIS — I120 Hypertensive chronic kidney disease with stage 5 chronic kidney disease or end stage renal disease: Secondary | ICD-10-CM | POA: Insufficient documentation

## 2014-03-10 DIAGNOSIS — N186 End stage renal disease: Secondary | ICD-10-CM | POA: Insufficient documentation

## 2014-03-10 DIAGNOSIS — Z85850 Personal history of malignant neoplasm of thyroid: Secondary | ICD-10-CM | POA: Insufficient documentation

## 2014-03-10 DIAGNOSIS — Z7982 Long term (current) use of aspirin: Secondary | ICD-10-CM | POA: Insufficient documentation

## 2014-03-11 NOTE — Progress Notes (Signed)
[facility]  NAME:  [name], [name]                  ACCOUNT NO.:  [id number]  MEDICAL RECORD NO.:  [id number]      DATE OF BIRTH:  1970/12/18  PHYSICIAN:  Judene Companion, M.D.           VISIT DATE:                                  OFFICE VISIT   Lori [name] is a 43 year old lady who unfortunately has had many, many health problems including peripheral vascular disease, hypertension, and she has total renal failure, and is on dialysis.  She comes to [place] with a very tender infected area of calciphylaxis that is probably about 15 x 4 cm on the left upper quadrant of her abdomen.  When she came here today, she was found to have a blood pressure of 116/84, respirations 20, pulse 85, temperature 97.  She is on many medicines including aspirin, doxycycline, Flagyl, Percocet, Plavix, Synthroid, vitamins.  She has a history also of hyperparathyroid adenoma that was removed, also thyroid carcinoma and she is on Synthroid and vitamin D for these problems.  So, I tried to debride this and she just cried out in pain and I felt it would be good to debride this large area of an infected calciphylaxis, so I am asking Dr. Migdalia Dk to do this under general anesthesia.     Judene Companion, M.D.     PP/MEDQ  D:  03/10/2014  T:  03/11/2014  Job:  [id number]

## 2014-03-16 DIAGNOSIS — L988 Other specified disorders of the skin and subcutaneous tissue: Secondary | ICD-10-CM | POA: Diagnosis not present

## 2014-03-16 DIAGNOSIS — I120 Hypertensive chronic kidney disease with stage 5 chronic kidney disease or end stage renal disease: Secondary | ICD-10-CM | POA: Diagnosis not present

## 2014-03-16 DIAGNOSIS — L98499 Non-pressure chronic ulcer of skin of other sites with unspecified severity: Secondary | ICD-10-CM | POA: Diagnosis not present

## 2014-03-16 DIAGNOSIS — I739 Peripheral vascular disease, unspecified: Secondary | ICD-10-CM | POA: Diagnosis not present

## 2014-03-22 DIAGNOSIS — I739 Peripheral vascular disease, unspecified: Secondary | ICD-10-CM | POA: Diagnosis not present

## 2014-03-22 DIAGNOSIS — L988 Other specified disorders of the skin and subcutaneous tissue: Secondary | ICD-10-CM | POA: Diagnosis not present

## 2014-03-22 DIAGNOSIS — I120 Hypertensive chronic kidney disease with stage 5 chronic kidney disease or end stage renal disease: Secondary | ICD-10-CM | POA: Diagnosis not present

## 2014-03-22 DIAGNOSIS — L98499 Non-pressure chronic ulcer of skin of other sites with unspecified severity: Secondary | ICD-10-CM | POA: Diagnosis not present

## 2014-03-26 ENCOUNTER — Encounter (HOSPITAL_COMMUNITY): Payer: Self-pay | Admitting: Emergency Medicine

## 2014-03-26 ENCOUNTER — Emergency Department: Payer: Self-pay | Admitting: Emergency Medicine

## 2014-03-26 ENCOUNTER — Inpatient Hospital Stay (HOSPITAL_COMMUNITY)
Admission: EM | Admit: 2014-03-26 | Discharge: 2014-03-30 | DRG: 919 | Disposition: A | Discharge status: Home / Self Care | Payer: Medicare Other | Attending: Internal Medicine | Admitting: Internal Medicine

## 2014-03-26 DIAGNOSIS — S31109A Unspecified open wound of abdominal wall, unspecified quadrant without penetration into peritoneal cavity, initial encounter: Secondary | ICD-10-CM

## 2014-03-26 DIAGNOSIS — Y838 Other surgical procedures as the cause of abnormal reaction of the patient, or of later complication, without mention of misadventure at the time of the procedure: Secondary | ICD-10-CM | POA: Diagnosis present

## 2014-03-26 DIAGNOSIS — Z8249 Family history of ischemic heart disease and other diseases of the circulatory system: Secondary | ICD-10-CM

## 2014-03-26 DIAGNOSIS — Z992 Dependence on renal dialysis: Secondary | ICD-10-CM | POA: Diagnosis present

## 2014-03-26 DIAGNOSIS — L089 Local infection of the skin and subcutaneous tissue, unspecified: Secondary | ICD-10-CM

## 2014-03-26 DIAGNOSIS — R002 Palpitations: Secondary | ICD-10-CM

## 2014-03-26 DIAGNOSIS — Z8639 Personal history of other endocrine, nutritional and metabolic disease: Secondary | ICD-10-CM

## 2014-03-26 DIAGNOSIS — R578 Other shock: Secondary | ICD-10-CM

## 2014-03-26 DIAGNOSIS — R55 Syncope and collapse: Secondary | ICD-10-CM

## 2014-03-26 DIAGNOSIS — Z862 Personal history of diseases of the blood and blood-forming organs and certain disorders involving the immune mechanism: Secondary | ICD-10-CM | POA: Diagnosis present

## 2014-03-26 DIAGNOSIS — I2510 Atherosclerotic heart disease of native coronary artery without angina pectoris: Secondary | ICD-10-CM

## 2014-03-26 DIAGNOSIS — I120 Hypertensive chronic kidney disease with stage 5 chronic kidney disease or end stage renal disease: Secondary | ICD-10-CM | POA: Diagnosis present

## 2014-03-26 DIAGNOSIS — T148XXA Other injury of unspecified body region, initial encounter: Secondary | ICD-10-CM

## 2014-03-26 DIAGNOSIS — R5381 Other malaise: Secondary | ICD-10-CM | POA: Diagnosis present

## 2014-03-26 DIAGNOSIS — E039 Hypothyroidism, unspecified: Secondary | ICD-10-CM

## 2014-03-26 DIAGNOSIS — E669 Obesity, unspecified: Secondary | ICD-10-CM

## 2014-03-26 DIAGNOSIS — N039 Chronic nephritic syndrome with unspecified morphologic changes: Secondary | ICD-10-CM

## 2014-03-26 DIAGNOSIS — Z87891 Personal history of nicotine dependence: Secondary | ICD-10-CM

## 2014-03-26 DIAGNOSIS — D631 Anemia in chronic kidney disease: Secondary | ICD-10-CM | POA: Diagnosis present

## 2014-03-26 DIAGNOSIS — E063 Autoimmune thyroiditis: Secondary | ICD-10-CM | POA: Diagnosis present

## 2014-03-26 DIAGNOSIS — T8119XA Other postprocedural shock, initial encounter: Principal | ICD-10-CM | POA: Diagnosis present

## 2014-03-26 DIAGNOSIS — K219 Gastro-esophageal reflux disease without esophagitis: Secondary | ICD-10-CM

## 2014-03-26 DIAGNOSIS — Z91199 Patient's noncompliance with other medical treatment and regimen due to unspecified reason: Secondary | ICD-10-CM

## 2014-03-26 DIAGNOSIS — Z833 Family history of diabetes mellitus: Secondary | ICD-10-CM

## 2014-03-26 DIAGNOSIS — D638 Anemia in other chronic diseases classified elsewhere: Secondary | ICD-10-CM

## 2014-03-26 DIAGNOSIS — N186 End stage renal disease: Secondary | ICD-10-CM

## 2014-03-26 DIAGNOSIS — D62 Acute posthemorrhagic anemia: Secondary | ICD-10-CM

## 2014-03-26 DIAGNOSIS — L0390 Cellulitis, unspecified: Secondary | ICD-10-CM

## 2014-03-26 DIAGNOSIS — R42 Dizziness and giddiness: Secondary | ICD-10-CM

## 2014-03-26 DIAGNOSIS — N2581 Secondary hyperparathyroidism of renal origin: Secondary | ICD-10-CM | POA: Diagnosis present

## 2014-03-26 DIAGNOSIS — Z9119 Patient's noncompliance with other medical treatment and regimen: Secondary | ICD-10-CM

## 2014-03-26 HISTORY — DX: Other disorders of calcium metabolism: E83.59

## 2014-03-26 HISTORY — DX: Hypothyroidism, unspecified: E03.9

## 2014-03-26 HISTORY — DX: Local infection of the skin and subcutaneous tissue, unspecified: L08.9

## 2014-03-26 HISTORY — DX: Unspecified open wound of abdominal wall, unspecified quadrant without penetration into peritoneal cavity, initial encounter: S31.109A

## 2014-03-26 LAB — HEMATOCRIT: HCT: 32 % — ABNORMAL LOW (ref 35.0–47.0)

## 2014-03-26 LAB — BASIC METABOLIC PANEL
Anion Gap: 13 (ref 7–16)
BUN: 36 mg/dL — ABNORMAL HIGH (ref 7–18)
Calcium, Total: 7.7 mg/dL — ABNORMAL LOW (ref 8.5–10.1)
Chloride: 99 mmol/L (ref 98–107)
Co2: 26 mmol/L (ref 21–32)
Creatinine: 9.54 mg/dL — ABNORMAL HIGH (ref 0.60–1.30)
EGFR (African American): 5 — ABNORMAL LOW
EGFR (Non-African Amer.): 4 — ABNORMAL LOW
Glucose: 79 mg/dL (ref 65–99)
Osmolality: 283 (ref 275–301)
Potassium: 3.5 mmol/L (ref 3.5–5.1)
Sodium: 138 mmol/L (ref 136–145)

## 2014-03-26 LAB — CBC WITH DIFFERENTIAL/PLATELET
Basophil #: 0.2 10*3/uL — ABNORMAL HIGH (ref 0.0–0.1)
Basophil %: 2 %
Eosinophil #: 0.3 10*3/uL (ref 0.0–0.7)
Eosinophil %: 2.9 %
HCT: 33.1 % — ABNORMAL LOW (ref 35.0–47.0)
HGB: 10.7 g/dL — ABNORMAL LOW (ref 12.0–16.0)
Lymphocyte #: 1 10*3/uL (ref 1.0–3.6)
Lymphocyte %: 9.7 %
MCH: 29.1 pg (ref 26.0–34.0)
MCHC: 32.2 g/dL (ref 32.0–36.0)
MCV: 90 fL (ref 80–100)
Monocyte #: 0.8 x10 3/mm (ref 0.2–0.9)
Monocyte %: 7.6 %
Neutrophil #: 7.8 10*3/uL — ABNORMAL HIGH (ref 1.4–6.5)
Neutrophil %: 77.8 %
Platelet: 332 10*3/uL (ref 150–440)
RBC: 3.66 10*6/uL — ABNORMAL LOW (ref 3.80–5.20)
RDW: 15.3 % — ABNORMAL HIGH (ref 11.5–14.5)
WBC: 10 10*3/uL (ref 3.6–11.0)

## 2014-03-26 LAB — PROTIME-INR
INR: 1.1
Prothrombin Time: 14.1 secs (ref 11.5–14.7)

## 2014-03-26 LAB — APTT: Activated PTT: 36.3 secs — ABNORMAL HIGH (ref 23.6–35.9)

## 2014-03-26 LAB — HEMOGLOBIN: HGB: 10 g/dL — ABNORMAL LOW (ref 12.0–16.0)

## 2014-03-26 MED ORDER — SODIUM CHLORIDE 0.9 % IV SOLN
1000.0000 mL | Freq: Once | INTRAVENOUS | Status: AC
  Administered 2014-03-26: 1000 mL via INTRAVENOUS

## 2014-03-26 MED ORDER — SODIUM CHLORIDE 0.9 % IV SOLN
1000.0000 mL | INTRAVENOUS | Status: DC
  Administered 2014-03-26: 1000 mL via INTRAVENOUS

## 2014-03-26 MED ORDER — LIDOCAINE-EPINEPHRINE (PF) 2 %-1:200000 IJ SOLN
INTRAMUSCULAR | Status: AC
  Administered 2014-03-26
  Filled 2014-03-26: qty 20

## 2014-03-26 MED ORDER — NOREPINEPHRINE BITARTRATE 1 MG/ML IV SOLN
INTRAVENOUS | Status: AC
  Filled 2014-03-26: qty 4

## 2014-03-26 NOTE — ED Notes (Signed)
Dr. Venora Maples attempting central line insertion at this time.

## 2014-03-26 NOTE — ED Provider Notes (Signed)
CSN: [id number]     Arrival date & time 03/26/14  2318 History  This chart was scribed for Hoy Morn, MD by Marcha Dutton, ED Scribe. This patient was seen in room APA02/APA02 and the patient's care was started at 11:18 PM.    Chief Complaint  Patient presents with  . Coagulation Disorder  . Hypotension    HPI HPI Comments: Lori [name] is a 43 y.o. female with a h/o calciphylaxis who presents to the Emergency Department bleeding from left abdomen from a prior wound. Dr. Migdalia Dk cut off scab 4 days ago at her office and told her to come back in two weeks. Relative states bleeding began around 1 PM today and that they came here after just leaving the [facility] in [place] where pt had bleeding staunched a few hours ago. Pt was kept after bleeding stopped via pressure for observation. Relative states movement perhaps caused bleeding to restart. Pt reports burning pain from wound and associated nausea.  Level 5 Caveat: severity of illness  Past Medical History  Diagnosis Date  . ASCVD (arteriosclerotic cardiovascular disease)   . Hypertension   . Anemia   . Hyperparathyroidism   . Hashimoto thyroiditis   . GERD (gastroesophageal reflux disease)   . Morbid obesity   . Medically noncompliant   . ESRD (end stage renal disease)     Due to membranous GN dialysis 09/1996; peritoneal dialysis --? peitonitis; difficult vascular access  . Dialysis patient     Past Surgical History  Procedure Laterality Date  . Tonsillectomy and adenoidectomy    . Thyroidectomy, partial      Resectin of left lobe with reimplantation in the forearm, small focus of papillary carcinoma incidentally noted at pathology - 2000 and Hashimoto's thyrdoiditis in 2001  . Btl  1992    Family History  Problem Relation Age of Onset  . Coronary artery disease Mother   . Kidney disease Father   . Diabetes Sister    History  Substance Use Topics  . Smoking status: Former Research scientist (life sciences)  . Smokeless  tobacco: Not on file  . Alcohol Use: No    OB History   Grav Para Term Preterm Abortions TAB SAB Ect Mult Living                  Review of Systems  Unable to perform ROS: Acuity of condition     Allergies  Bee pollen; Activase; and Warfarin sodium   Home Medications    Prior to Admission medications   Medication Sig Start Date End Date Taking? Authorizing Provider  albuterol (PROVENTIL HFA;VENTOLIN HFA) 108 (90 BASE) MCG/ACT inhaler Inhale 2 puffs into the lungs every 6 (six) hours as needed. Shortness of breath    Historical Provider, MD  calcium elemental as carbonate (BARIATRIC TUMS ULTRA) 400 MG tablet Chew 1,000 mg by mouth 3 (three) times daily. Patient also takes 1 tablet twice a day with snack    Historical Provider, MD  lanthanum (FOSRENOL) 1000 MG chewable tablet Chew 1,000-2,000 mg by mouth 3 (three) times daily with meals. 2 tablets with meals and 1 tablet with snack    Historical Provider, MD  levothyroxine (SYNTHROID, LEVOTHROID) 300 MCG tablet Take 300 mcg by mouth at bedtime.      Historical Provider, MD  liothyronine (CYTOMEL) 25 MCG tablet Take 25 mcg by mouth daily.    Historical Provider, MD  losartan (COZAAR) 50 MG tablet Take 50 mg by mouth daily.  Historical Provider, MD  multivitamin (RENA-VIT) TABS tablet Take 1 tablet by mouth daily.      Historical Provider, MD  omeprazole (PRILOSEC) 20 MG capsule Take 20 mg by mouth 2 (two) times daily.    Historical Provider, MD    Triage Vitals: BP 84/58  Pulse 69  Resp 13  SpO2 100%   Physical Exam  Nursing note and vitals reviewed. Constitutional: She is oriented to person, place, and time. She appears well-developed and well-nourished. No distress.  HENT:  Head: Normocephalic and atraumatic.  Eyes: EOM are normal.  Neck: Normal range of motion.  Cardiovascular: Normal rate, regular rhythm and normal heart sounds.   Pulmonary/Chest: Effort normal and breath sounds normal.  Abdominal: Soft. She  exhibits no distension. There is no tenderness.  chronic wound to left side of abdomen with focal bleeding venous in nature on the medial side of the wound with applied pressure no additional bleeding noted  Musculoskeletal: Normal range of motion.  Neurological: She is alert and oriented to person, place, and time.  Skin: Skin is warm and dry.  Psychiatric: She has a normal mood and affect. Judgment normal.    ED Course  Procedures (including critical care time)  CRITICAL CARE Performed by: Hoy Morn Total critical care time: 23 Critical care time was exclusive of separately billable procedures and treating other patients. Critical care was necessary to treat or prevent imminent or life-threatening deterioration. Critical care was time spent personally by me on the following activities: development of treatment plan with patient and/or surrogate as well as nursing, discussions with consultants, evaluation of patient's response to treatment, examination of patient, obtaining history from patient or surrogate, ordering and performing treatments and interventions, ordering and review of laboratory studies, ordering and review of radiographic studies, pulse oximetry and re-evaluation of patient's condition.  CENTRAL LINE Performed by: Hoy Morn Consent: The procedure was performed in an emergent situation. Required items: required blood products, implants, devices, and special equipment available Patient identity confirmed: arm band and provided demographic data Time out: Immediately prior to procedure a "time out" was called to verify the correct patient, procedure, equipment, support staff and site/side marked as required. Indications: vascular access Anesthesia: local infiltration Local anesthetic: lidocaine 1% with epinephrine Anesthetic total: 3 ml Patient sedated: no Preparation: skin prepped with 2% chlorhexidine Skin prep agent dried: skin prep agent completely dried  prior to procedure Sterile barriers: all five maximum sterile barriers used - cap, mask, sterile gown, sterile gloves, and large sterile sheet Hand hygiene: hand hygiene performed prior to central venous catheter insertion Location details: right femoral vein Catheter type: triple lumen Catheter size: 8 Fr Pre-procedure: landmarks identified Ultrasound guidance: YES Successful placement: yes Post-procedure: line sutured and dressing applied Assessment: blood return through all parts, free fluid flow, placement verified by x-ray and no pneumothorax on x-ray Patient tolerance: Patient tolerated the procedure well with no immediate complications.     DIAGNOSTIC STUDIES: Oxygen Saturation is 100% on Lester, normal by my interpretation.     COORDINATION OF CARE: 12:10 AM- Pt advised of plan for treatment and pt agrees.  12:44 AM- 135/84 feeling better, no active bleeding, still on levophed, will try to wean off now. Additional blood transfusion pending.  Labs Review Labs Reviewed  CBC WITH DIFFERENTIAL - Abnormal; Notable for the following:    RBC 2.57 (*)    Hemoglobin 7.4 (*)    HCT 23.7 (*)    All other components within normal limits  BASIC METABOLIC PANEL - Abnormal; Notable for the following:    Glucose, Bld 204 (*)    BUN 39 (*)    Creatinine, Ser 9.14 (*)    Calcium 6.4 (*)    GFR calc non Af Amer 5 (*)    GFR calc Af Amer 5 (*)    All other components within normal limits  TROPONIN I  TYPE AND SCREEN  ABO/RH  PREPARE RBC (CROSSMATCH)    Imaging Review No results found.  ECG interpretation 2331   Date: 03/27/2014  Rate: 88  Rhythm: normal sinus rhythm  QRS Axis: normal  Intervals: normal  ST/T Wave abnormalities: nonspecific latera T wave inversions  Conduction Disutrbances: none  Narrative Interpretation:   Old EKG Reviewed: mild T wave changes as compared to prior  ECG interpretation 0020  Date: 03/27/2014  Rate: 65  Rhythm: normal sinus rhythm  QRS  Axis: normal  Intervals: normal  ST/T Wave abnormalities: mild flattening of T waves laterally  Conduction Disutrbances: none  Narrative Interpretation:   Old EKG Reviewed: improvement in T wave inversions compared to prior      MDM    Final diagnoses:  Bleeding from wound  Hemorrhagic shock  Calciphylaxis  ESRD (end stage renal disease)    Presented with active bleeding and hemorrhagic shock.   1:38 AM Patient is receiving her second unit of packed red blood cells at this time.  Initially she appeared to have active venous bleeding from the medial aspect of her wound.  This was controlled with direct pressure and a figure-of-eight stitch as well as lidocaine with epinephrine.  There's been no more active bleeding at this time.  The patient was in hemorrhagic shock and had a blood pressure in the [id number] systolic.  At one point her blood pressure dropped into the 50s.  Emergent blood was transfused.  Right femoral vein triple-lumen was placed and the patient was started on Levaquin for additional pressor support while blood was transfusing.  Patient is now off pressors.  She is receiving her second unit of packed red blood cells.  I personally performed the services described in this documentation, which was scribed in my presence. The recorded information has been reviewed and is accurate.      Hoy Morn, MD 03/27/14 [phone]

## 2014-03-27 ENCOUNTER — Observation Stay (HOSPITAL_COMMUNITY): Payer: Medicare Other

## 2014-03-27 ENCOUNTER — Encounter (HOSPITAL_COMMUNITY): Payer: Self-pay | Admitting: Internal Medicine

## 2014-03-27 DIAGNOSIS — N186 End stage renal disease: Secondary | ICD-10-CM

## 2014-03-27 DIAGNOSIS — L089 Local infection of the skin and subcutaneous tissue, unspecified: Secondary | ICD-10-CM

## 2014-03-27 DIAGNOSIS — E039 Hypothyroidism, unspecified: Secondary | ICD-10-CM | POA: Diagnosis present

## 2014-03-27 DIAGNOSIS — T148XXA Other injury of unspecified body region, initial encounter: Secondary | ICD-10-CM | POA: Diagnosis present

## 2014-03-27 DIAGNOSIS — R578 Other shock: Secondary | ICD-10-CM

## 2014-03-27 DIAGNOSIS — S31109A Unspecified open wound of abdominal wall, unspecified quadrant without penetration into peritoneal cavity, initial encounter: Secondary | ICD-10-CM

## 2014-03-27 DIAGNOSIS — D62 Acute posthemorrhagic anemia: Secondary | ICD-10-CM | POA: Diagnosis present

## 2014-03-27 DIAGNOSIS — Z992 Dependence on renal dialysis: Secondary | ICD-10-CM

## 2014-03-27 DIAGNOSIS — T792XXA Traumatic secondary and recurrent hemorrhage and seroma, initial encounter: Secondary | ICD-10-CM

## 2014-03-27 LAB — COMPREHENSIVE METABOLIC PANEL
ALT: <5 U/L (ref 0–35)
AST: 10 U/L (ref 0–37)
Albumin: 2.3 g/dL — ABNORMAL LOW (ref 3.5–5.2)
Alkaline Phosphatase: 77 U/L (ref 39–117)
BUN: 41 mg/dL — ABNORMAL HIGH (ref 6–23)
CO2: 23 mEq/L (ref 19–32)
Calcium: 6.3 mg/dL — CL (ref 8.4–10.5)
Chloride: 103 mEq/L (ref 96–112)
Creatinine, Ser: 9.71 mg/dL — ABNORMAL HIGH (ref 0.50–1.10)
GFR calc Af Amer: 5 mL/min — ABNORMAL LOW (ref >90)
GFR calc non Af Amer: 4 mL/min — ABNORMAL LOW (ref >90)
Glucose, Bld: 96 mg/dL (ref 70–99)
Potassium: 3.9 mEq/L (ref 3.7–5.3)
Sodium: 142 mEq/L (ref 137–147)
Total Bilirubin: 0.3 mg/dL (ref 0.3–1.2)
Total Protein: 5 g/dL — ABNORMAL LOW (ref 6.0–8.3)

## 2014-03-27 LAB — TROPONIN I: Troponin I: <0.3 ng/mL (ref <0.30)

## 2014-03-27 LAB — CBC WITH DIFFERENTIAL/PLATELET
Basophils Absolute: 0.1 10*3/uL (ref 0.0–0.1)
Basophils Relative: 1 % (ref 0–1)
Eosinophils Absolute: 0.3 10*3/uL (ref 0.0–0.7)
Eosinophils Relative: 4 % (ref 0–5)
HCT: 23.7 % — ABNORMAL LOW (ref 36.0–46.0)
Hemoglobin: 7.4 g/dL — ABNORMAL LOW (ref 12.0–15.0)
Lymphocytes Relative: 21 % (ref 12–46)
Lymphs Abs: 1.8 10*3/uL (ref 0.7–4.0)
MCH: 28.8 pg (ref 26.0–34.0)
MCHC: 31.2 g/dL (ref 30.0–36.0)
MCV: 92.2 fL (ref 78.0–100.0)
Monocytes Absolute: 0.6 10*3/uL (ref 0.1–1.0)
Monocytes Relative: 7 % (ref 3–12)
Neutro Abs: 5.8 10*3/uL (ref 1.7–7.7)
Neutrophils Relative %: 67 % (ref 43–77)
Platelets: 360 10*3/uL (ref 150–400)
RBC: 2.57 MIL/uL — ABNORMAL LOW (ref 3.87–5.11)
RDW: 15 % (ref 11.5–15.5)
WBC: 8.5 10*3/uL (ref 4.0–10.5)

## 2014-03-27 LAB — CBC
HCT: 26.8 % — ABNORMAL LOW (ref 36.0–46.0)
Hemoglobin: 8.7 g/dL — ABNORMAL LOW (ref 12.0–15.0)
MCH: 29.1 pg (ref 26.0–34.0)
MCHC: 32.5 g/dL (ref 30.0–36.0)
MCV: 89.6 fL (ref 78.0–100.0)
Platelets: 281 10*3/uL (ref 150–400)
RBC: 2.99 MIL/uL — ABNORMAL LOW (ref 3.87–5.11)
RDW: 15.4 % (ref 11.5–15.5)
WBC: 15.2 10*3/uL — ABNORMAL HIGH (ref 4.0–10.5)

## 2014-03-27 LAB — BASIC METABOLIC PANEL
BUN: 39 mg/dL — ABNORMAL HIGH (ref 6–23)
CO2: 21 mEq/L (ref 19–32)
Calcium: 6.4 mg/dL — CL (ref 8.4–10.5)
Chloride: 99 mEq/L (ref 96–112)
Creatinine, Ser: 9.14 mg/dL — ABNORMAL HIGH (ref 0.50–1.10)
GFR calc Af Amer: 5 mL/min — ABNORMAL LOW (ref >90)
GFR calc non Af Amer: 5 mL/min — ABNORMAL LOW (ref >90)
Glucose, Bld: 204 mg/dL — ABNORMAL HIGH (ref 70–99)
Potassium: 3.9 mEq/L (ref 3.7–5.3)
Sodium: 138 mEq/L (ref 137–147)

## 2014-03-27 LAB — ABO/RH: ABO/RH(D): A POS

## 2014-03-27 LAB — PREPARE RBC (CROSSMATCH)

## 2014-03-27 LAB — MRSA PCR SCREENING: MRSA by PCR: NEGATIVE

## 2014-03-27 LAB — PROTIME-INR
INR: 1.25 (ref 0.00–1.49)
Prothrombin Time: 15.4 seconds — ABNORMAL HIGH (ref 11.6–15.2)

## 2014-03-27 LAB — CALCIUM, IONIZED: Calcium, Ion: 0.8 mmol/L — ABNORMAL LOW (ref 1.12–1.23)

## 2014-03-27 LAB — PHOSPHORUS: Phosphorus: 5.3 mg/dL — ABNORMAL HIGH (ref 2.3–4.6)

## 2014-03-27 MED ORDER — ALBUTEROL SULFATE (2.5 MG/3ML) 0.083% IN NEBU: 3.0000 mL | INHALATION_SOLUTION | Freq: Four times a day (QID) | RESPIRATORY_TRACT | Status: DC | PRN

## 2014-03-27 MED ORDER — LIOTHYRONINE SODIUM 25 MCG PO TABS
25.0000 ug | ORAL_TABLET | Freq: Every day | ORAL | Status: DC
  Administered 2014-03-27 – 2014-03-29 (×3): 25 ug via ORAL
  Filled 2014-03-27 (×5): qty 1

## 2014-03-27 MED ORDER — SODIUM CHLORIDE 0.9 % IV SOLN: 100.0000 mL | INTRAVENOUS | Status: DC | PRN

## 2014-03-27 MED ORDER — ONDANSETRON HCL 4 MG PO TABS: 4.0000 mg | ORAL_TABLET | Freq: Four times a day (QID) | ORAL | Status: DC | PRN

## 2014-03-27 MED ORDER — PENTAFLUOROPROP-TETRAFLUOROETH EX AERO
1.0000 "application " | INHALATION_SPRAY | CUTANEOUS | Status: DC | PRN
  Filled 2014-03-27: qty 103.5

## 2014-03-27 MED ORDER — SODIUM CHLORIDE 0.9 % IJ SOLN: 3.0000 mL | INTRAMUSCULAR | Status: DC | PRN

## 2014-03-27 MED ORDER — ACETAMINOPHEN 650 MG RE SUPP: 650.0000 mg | Freq: Four times a day (QID) | RECTAL | Status: DC | PRN

## 2014-03-27 MED ORDER — MORPHINE SULFATE 2 MG/ML IJ SOLN
INTRAMUSCULAR | Status: AC
  Administered 2014-03-27: 2 mg via INTRAVENOUS
  Filled 2014-03-27: qty 1

## 2014-03-27 MED ORDER — SODIUM CHLORIDE 0.9 % IV SOLN: 250.0000 mL | INTRAVENOUS | Status: DC | PRN

## 2014-03-27 MED ORDER — SODIUM CHLORIDE 0.9 % IV SOLN
INTRAVENOUS | Status: DC
  Administered 2014-03-27: 04:00:00 via INTRAVENOUS

## 2014-03-27 MED ORDER — NEPRO/CARBSTEADY PO LIQD: 237.0000 mL | ORAL | Status: DC | PRN

## 2014-03-27 MED ORDER — DEXTROSE 5 % IV SOLN
2.0000 g | INTRAVENOUS | Status: DC
  Administered 2014-03-27 – 2014-03-30 (×2): 2 g via INTRAVENOUS
  Filled 2014-03-27 (×2): qty 2

## 2014-03-27 MED ORDER — LIDOCAINE-PRILOCAINE 2.5-2.5 % EX CREA
1.0000 "application " | TOPICAL_CREAM | CUTANEOUS | Status: DC | PRN
  Filled 2014-03-27: qty 5

## 2014-03-27 MED ORDER — SODIUM CHLORIDE 0.9 % IV SOLN
1500.0000 mg | Freq: Once | INTRAVENOUS | Status: AC
  Administered 2014-03-27: 1500 mg via INTRAVENOUS
  Filled 2014-03-27: qty 1500

## 2014-03-27 MED ORDER — MORPHINE SULFATE 2 MG/ML IJ SOLN
2.0000 mg | INTRAMUSCULAR | Status: DC | PRN
  Administered 2014-03-27 (×2): 4 mg via INTRAVENOUS
  Administered 2014-03-27 – 2014-03-29 (×9): 2 mg via INTRAVENOUS
  Administered 2014-03-30: 4 mg via INTRAVENOUS
  Administered 2014-03-30 (×2): 2 mg via INTRAVENOUS
  Filled 2014-03-27 (×3): qty 1
  Filled 2014-03-27: qty 2
  Filled 2014-03-27 (×5): qty 1
  Filled 2014-03-27: qty 2
  Filled 2014-03-27 (×2): qty 1
  Filled 2014-03-27: qty 2

## 2014-03-27 MED ORDER — LIDOCAINE HCL (PF) 1 % IJ SOLN: 5.0000 mL | INTRAMUSCULAR | Status: DC | PRN

## 2014-03-27 MED ORDER — LEVOTHYROXINE SODIUM 100 MCG PO TABS
300.0000 ug | ORAL_TABLET | Freq: Every day | ORAL | Status: DC
  Administered 2014-03-27 – 2014-03-29 (×3): 300 ug via ORAL
  Filled 2014-03-27 (×4): qty 3

## 2014-03-27 MED ORDER — ACETAMINOPHEN 325 MG PO TABS: 650.0000 mg | ORAL_TABLET | Freq: Four times a day (QID) | ORAL | Status: DC | PRN

## 2014-03-27 MED ORDER — SODIUM CHLORIDE 0.9 % IJ SOLN
3.0000 mL | Freq: Two times a day (BID) | INTRAMUSCULAR | Status: DC
  Administered 2014-03-27 – 2014-03-29 (×6): 3 mL via INTRAVENOUS

## 2014-03-27 MED ORDER — VANCOMYCIN HCL IN DEXTROSE 1-5 GM/200ML-% IV SOLN
1000.0000 mg | INTRAVENOUS | Status: DC
  Administered 2014-03-27 – 2014-03-30 (×2): 1000 mg via INTRAVENOUS
  Filled 2014-03-27 (×2): qty 200

## 2014-03-27 MED ORDER — HEPARIN SODIUM (PORCINE) 1000 UNIT/ML DIALYSIS
1000.0000 [IU] | INTRAMUSCULAR | Status: DC | PRN
  Filled 2014-03-27: qty 1

## 2014-03-27 MED ORDER — NOREPINEPHRINE BITARTRATE 1 MG/ML IV SOLN
2.0000 ug/min | INTRAVENOUS | Status: DC
  Administered 2014-03-27: 5 ug/min via INTRAVENOUS
  Filled 2014-03-27: qty 4

## 2014-03-27 MED ORDER — ONDANSETRON HCL 4 MG/2ML IJ SOLN
4.0000 mg | Freq: Four times a day (QID) | INTRAMUSCULAR | Status: DC | PRN
  Administered 2014-03-27: 4 mg via INTRAVENOUS
  Filled 2014-03-27: qty 2

## 2014-03-27 NOTE — Progress Notes (Addendum)
ANTIBIOTIC CONSULT NOTE - INITIAL  Pharmacy Consult for Vancomycin  Indication: cellulitis  Allergies  Allergen Reactions  . Bee Pollen Anaphylaxis  . Activase [Alteplase] Other (See Comments)    dyspnea  . Warfarin Sodium Nausea And Vomiting and Rash    Patient Measurements: Height: 5\' 3"  (160 cm) Weight: 216 lb 4.3 oz (98.1 kg) IBW/kg (Calculated) : 52.4  Vital Signs: Temp: 97.8 F (36.6 C) (05/02 0800) Temp src: Oral (05/02 0800) BP: 107/73 mmHg (05/02 0800) Pulse Rate: 59 (05/02 0425) Intake/Output from previous day: 05/01 0701 - 05/02 0700 In: 923.5 [I.V.:265.5; Blood:658] Out: -  Intake/Output from this shift:    Labs:  Recent Labs  03/27/14 03/27/14 0458  WBC 8.5 15.2*  HGB 7.4* 8.7*  PLT 360 281  CREATININE 9.14* 9.71*   Estimated Creatinine Clearance: 8.3 ml/min (by C-G formula based on Cr of 9.71). No results found for this basename: VANCOTROUGH, Corlis Leak, VANCORANDOM, [place], GENTPEAK, GENTRANDOM, TOBRATROUGH, TOBRAPEAK, TOBRARND, AMIKACINPEAK, AMIKACINTROU, AMIKACIN,  in the last 72 hours   Microbiology: Recent Results (from the past 720 hour(s))  MRSA PCR SCREENING     Status: None   Collection Time    03/27/14  2:30 AM      Result Value Ref Range Status   MRSA by PCR NEGATIVE  NEGATIVE Final   Comment:            The GeneXpert MRSA Assay (FDA     approved for NASAL specimens     only), is one component of a     comprehensive MRSA colonization     surveillance program. It is not     intended to diagnose MRSA     infection nor to guide or     monitor treatment for     MRSA infections.    Medical History: Past Medical History  Diagnosis Date  . ASCVD (arteriosclerotic cardiovascular disease)   . Hypertension   . Anemia   . Hyperparathyroidism   . Hashimoto thyroiditis   . GERD (gastroesophageal reflux disease)   . Morbid obesity   . Medically noncompliant   . ESRD (end stage renal disease)     Due to membranous GN dialysis  09/1996; peritoneal dialysis --? peitonitis; difficult vascular access  . Dialysis patient   . Hypothyroidism     Medications:  Scheduled:  . levothyroxine  300 mcg Oral QHS  . liothyronine  25 mcg Oral QAC breakfast  . sodium chloride  3 mL Intravenous Q12H  . vancomycin  1,500 mg Intravenous Once   Assessment: 43 yo F with ESRD requiring HD on TTS.  She has abdominal wound for >year which was debrided earlier this week and she's had subsequent bleeding.   She is currently afebrile, but WBC elevated.  Asked to initiate Vancomycin for cellulitis.  Plan HD today (missed Thursday session).  Vancomycin 5/2>>  Goal of Therapy:  Pre-HD level 15-25 mcg/ml  Plan:  Vancomycin 1500mg  IV load x1 now then 1gm after each HD Check pre-HD level at steady state Monitor labs & patient progress  Lavonia Drafts Sekai Gitlin 03/27/2014,10:58 AM  Also adding Tressie Ellis for abdominal wound infection.    Plan: Fortaz 2gm IV with each HD.

## 2014-03-27 NOTE — ED Notes (Signed)
Emergency blood, O negative started.

## 2014-03-27 NOTE — Progress Notes (Addendum)
Paged nephrology MD Lowanda Foster) - consult.  Received call back at 0802 from MD - advised of pt's ESRD and that she is a HD pt, also included K, BUN and creat results from this am.  Pt currently resting with eyes closed, generalized edema, easily arousalable but remains lethargic.  Will continue to closely monitor.

## 2014-03-27 NOTE — Consult Note (Signed)
Reason for Consult:end-stage renal disease Referring Physician: Dr. Glenford Peers is an 43 y.o. female.  HPI: she's the patient was history of generalized atherosclerotic disease, hypertension, secondary hyperparathyroidism status post surgery, end-stage renal disease on maintenance hemodialysis Tuesday Thursday Saturday presently came with complaints of bleeding from her abdomen. Patient was history of calciphylaxis and developed a abdominal wall ulcer. According to the patient she was seen by surgeon and she has some debridement done. Yesterday patient started bleeding because of such he him that at [facility]. After the bleeding was stopped patient was sent back on to be followed as an outpatient. When she went home patient start bleeding he is him to the emergency room where she was found to have anemia and severe hypotension. Patient has received 2 units of packed red cells with some improvement. Presently she complains of abdominal pain otherwise denies any nausea vomiting. Her last dialysis was on Tuesday and missed her dialysis on Thursday because of abdominal pain.  Past Medical History  Diagnosis Date  . ASCVD (arteriosclerotic cardiovascular disease)   . Hypertension   . Anemia   . Hyperparathyroidism   . Hashimoto thyroiditis   . GERD (gastroesophageal reflux disease)   . Morbid obesity   . Medically noncompliant   . ESRD (end stage renal disease)     Due to membranous GN dialysis 09/1996; peritoneal dialysis --? peitonitis; difficult vascular access  . Dialysis patient   . Hypothyroidism     Past Surgical History  Procedure Laterality Date  . Tonsillectomy and adenoidectomy    . Thyroidectomy, partial      Resectin of left lobe with reimplantation in the forearm, small focus of papillary carcinoma incidentally noted at pathology - 2000 and Hashimoto's thyrdoiditis in 2001  . Btl  1992    Family History  Problem Relation Age of Onset  .  Coronary artery disease Mother   . Kidney disease Father   . Diabetes Sister     Social History:  reports that she has quit smoking. She does not have any smokeless tobacco history on file. She reports that she does not drink alcohol or use illicit drugs.  Allergies:  Allergies  Allergen Reactions  . Bee Pollen Anaphylaxis  . Activase [Alteplase] Other (See Comments)    dyspnea  . Warfarin Sodium Nausea And Vomiting and Rash    Medications: I have reviewed the patient's current medications.  Results for orders placed during the hospital encounter of 03/26/14 (from the past 48 hour(s))  CBC WITH DIFFERENTIAL     Status: Abnormal   Collection Time    03/27/14 12:00 AM      Result Value Ref Range   WBC 8.5  4.0 - 10.5 K/uL   RBC 2.57 (*) 3.87 - 5.11 MIL/uL   Hemoglobin 7.4 (*) 12.0 - 15.0 g/dL   HCT 23.7 (*) 36.0 - 46.0 %   MCV 92.2  78.0 - 100.0 fL   MCH 28.8  26.0 - 34.0 pg   MCHC 31.2  30.0 - 36.0 g/dL   RDW 15.0  11.5 - 15.5 %   Platelets 360  150 - 400 K/uL   Neutrophils Relative % 67  43 - 77 %   Neutro Abs 5.8  1.7 - 7.7 K/uL   Lymphocytes Relative 21  12 - 46 %   Lymphs Abs 1.8  0.7 - 4.0 K/uL   Monocytes Relative 7  3 - 12 %   Monocytes Absolute 0.6  0.1 - 1.0 K/uL   Eosinophils Relative 4  0 - 5 %   Eosinophils Absolute 0.3  0.0 - 0.7 K/uL   Basophils Relative 1  0 - 1 %   Basophils Absolute 0.1  0.0 - 0.1 K/uL  BASIC METABOLIC PANEL     Status: Abnormal   Collection Time    03/27/14 12:00 AM      Result Value Ref Range   Sodium 138  137 - 147 mEq/L   Potassium 3.9  3.7 - 5.3 mEq/L   Chloride 99  96 - 112 mEq/L   CO2 21  19 - 32 mEq/L   Glucose, Bld 204 (*) 70 - 99 mg/dL   BUN 39 (*) 6 - 23 mg/dL   Creatinine, Ser 9.14 (*) 0.50 - 1.10 mg/dL   Calcium 6.4 (*) 8.4 - 10.5 mg/dL   Comment: RESULT REPEATED AND VERIFIED     CRITICAL RESULT CALLED TO, READ BACK BY AND VERIFIED WITH:      HAYMORE,R @ 0045 ON 03/27/14 BY WOODIE,J   GFR calc non Af Amer 5 (*) >90  mL/min   GFR calc Af Amer 5 (*) >90 mL/min   Comment: (NOTE)     The eGFR has been calculated using the CKD EPI equation.     This calculation has not been validated in all clinical situations.     eGFR's persistently <90 mL/min signify possible Chronic Kidney     Disease.  TYPE AND SCREEN     Status: None   Collection Time    03/27/14 12:00 AM      Result Value Ref Range   ABO/RH(D) A POS     Antibody Screen NEG     Sample Expiration 03/30/2014     Unit Number [id number]     Blood Component Type RBC LR PHER2     Unit division 00     Status of Unit ISSUED,FINAL     Transfusion Status OK TO TRANSFUSE     Crossmatch Result Compatible     Unit Number [id number]     Blood Component Type RED CELLS,LR     Unit division 00     Status of Unit ISSUED     Transfusion Status OK TO TRANSFUSE     Crossmatch Result Compatible    ABO/RH     Status: None   Collection Time    03/27/14 12:00 AM      Result Value Ref Range   ABO/RH(D) A POS    PREPARE RBC (CROSSMATCH)     Status: None   Collection Time    03/27/14 12:00 AM      Result Value Ref Range   Order Confirmation ORDER PROCESSED BY BLOOD BANK    TROPONIN I     Status: None   Collection Time    03/27/14 12:00 AM      Result Value Ref Range   Troponin I <0.30  <0.30 ng/mL   Comment:            Due to the release kinetics of cTnI,     a negative result within the first hours     of the onset of symptoms does not rule out     myocardial infarction with certainty.     If myocardial infarction is still suspected,     repeat the test at appropriate intervals.  MRSA PCR SCREENING     Status: None   Collection Time    03/27/14  2:30 AM  Result Value Ref Range   MRSA by PCR NEGATIVE  NEGATIVE   Comment:            The GeneXpert MRSA Assay (FDA     approved for NASAL specimens     only), is one component of a     comprehensive MRSA colonization     surveillance program. It is not     intended to diagnose MRSA      infection nor to guide or     monitor treatment for     MRSA infections.  CBC     Status: Abnormal   Collection Time    03/27/14  4:58 AM      Result Value Ref Range   WBC 15.2 (*) 4.0 - 10.5 K/uL   RBC 2.99 (*) 3.87 - 5.11 MIL/uL   Hemoglobin 8.7 (*) 12.0 - 15.0 g/dL   HCT 26.8 (*) 36.0 - 46.0 %   MCV 89.6  78.0 - 100.0 fL   MCH 29.1  26.0 - 34.0 pg   MCHC 32.5  30.0 - 36.0 g/dL   RDW 15.4  11.5 - 15.5 %   Platelets 281  150 - 400 K/uL  COMPREHENSIVE METABOLIC PANEL     Status: Abnormal   Collection Time    03/27/14  4:58 AM      Result Value Ref Range   Sodium 142  137 - 147 mEq/L   Potassium 3.9  3.7 - 5.3 mEq/L   Chloride 103  96 - 112 mEq/L   CO2 23  19 - 32 mEq/L   Glucose, Bld 96  70 - 99 mg/dL   BUN 41 (*) 6 - 23 mg/dL   Creatinine, Ser 9.71 (*) 0.50 - 1.10 mg/dL   Calcium 6.3 (*) 8.4 - 10.5 mg/dL   Comment: REPEATED TO VERIFY     CRITICAL RESULT CALLED TO, READ BACK BY AND VERIFIED WITH:     WAGNER,R AT 1610 ON 03/27/14 BY MOSLEYJ   Total Protein 5.0 (*) 6.0 - 8.3 g/dL   Albumin 2.3 (*) 3.5 - 5.2 g/dL   AST 10  0 - 37 U/L   ALT <5  0 - 35 U/L   Comment: REPEATED TO VERIFY   Alkaline Phosphatase 77  39 - 117 U/L   Total Bilirubin 0.3  0.3 - 1.2 mg/dL   GFR calc non Af Amer 4 (*) >90 mL/min   GFR calc Af Amer 5 (*) >90 mL/min   Comment: (NOTE)     The eGFR has been calculated using the CKD EPI equation.     This calculation has not been validated in all clinical situations.     eGFR's persistently <90 mL/min signify possible Chronic Kidney     Disease.  PROTIME-INR     Status: Abnormal   Collection Time    03/27/14  4:58 AM      Result Value Ref Range   Prothrombin Time 15.4 (*) 11.6 - 15.2 seconds   INR 1.25  0.00 - 1.49  PHOSPHORUS     Status: Abnormal   Collection Time    03/27/14  8:43 AM      Result Value Ref Range   Phosphorus 5.3 (*) 2.3 - 4.6 mg/dL    No results found.  Review of Systems  Eyes:       Patient with puffiness of her eye lid   Respiratory: Negative for shortness of breath.   Cardiovascular: Positive for leg swelling. Negative for orthopnea.  Gastrointestinal: Positive for abdominal pain. Negative for nausea and vomiting.  Neurological: Positive for weakness.   Blood pressure 107/73, pulse 59, temperature 97.8 F (36.6 C), temperature source Oral, resp. rate 15, height _0  (1.6 m), weight 98.1 kg (216 lb 4.3 oz), SpO2 100.00%. Physical Exam  Constitutional: She is oriented to person, place, and time.  Eyes: No scleral icterus.  Neck: JVD present.  Cardiovascular: Normal rate and regular rhythm.   Murmur heard. Respiratory: She has wheezes. She has no rales.  GI:  Patient with abdominal wound which was bleeding but now has a dressing  Musculoskeletal: She exhibits edema.  Neurological: She is alert and oriented to person, place, and time.    Assessment/Plan: Problem #1 end-stage renal disease she status post hemodialysis on Tuesday. She missed her dialysis on Thursday. Presently she denies any nausea vomiting and no difficulty in breathing. Problem #2 bleeding from abdominal wound. Patient has been on dialysis for 17 years with poorly controlled phosphorus of 10-12 according to the patient. She had parathyroid surgery. Presently he seems she has developed calciphylaxis and because of that she had debridement of her wound. Presently she seems to be bleeding from the site. Problem #3 hypertension: Patient was hypotensive when she came possibly from hemorrhagic shock. As this moment patient's not on pressure support. Problem #4 metabolic bone disease: Her calcium is low but phosphorus is in range. Her low calcium is possibly due to recent second hyperparathyroid surgey Problem#4Anemia Problem #6 atherosclerotic cardiovascular disease Problem #7 hypothyroidism Plan: We'll make arrangement for patient to get dialysis today.Burnis Medin try to move about 2-3 L if her blood pressure tolerates. We will use 3k/ 3.5  calcium bath/ If patient is bleeding from calciphylaxis at this moment no specific treatment is available for it. Patient already had parathyroid surgery. Hopefully controlling her phosphorus helpful. We'll check her basic metabolic panel and CBC in the morning. If her phosphorus is high we will use calcium based binders Harriett Sine 03/27/2014, 9:57 AM

## 2014-03-27 NOTE — H&P (Signed)
PCP:   No PCP Per Patient   Chief Complaint:  Bleeding from the abdominal wound HPI:  43 year old female with a history of calciphylaxis, who presented to the ED with left abdominal wound. Patient was seen by plastic surgeon 4 days ago, Dr. Migdalia Dk cut off scab 4 days ago, and patient was told to come back in 2 weeks. Patient has this abdominal wound since January of this year. Yesterday around 1 PM bleeding again began and patient went to [facility] in [place] where she was observed and bleeding stopped in few hours. Patient was discharged and was told to keep pressure to avoid bleeding. When patient arrived home she started bleeding again and then came to the [facility] emergency department. In the ED patient was found to be in hemorrhagic shock. She required 2 units of PRBC , the bleeding was controlled with direct pressure and figure of 8 stitch as was lidocaine with epinephrine. Patient was hypotensive with blood pressure in 70s, she required femoral line and then required Levophed pressor support for about 45 minutes. Now patient is off the pressors. She is alert and oriented, though she still complains of mild pain in the abdominal wound. Which gets worse on movement. Patient has received 2 units of PRBC. At this time systolic blood pressure is in 100s. She denies chest pain, no shortness of breath, she has nausea but no vomiting.  Allergies:   Allergies  Allergen Reactions  . Bee Pollen Anaphylaxis  . Activase [Alteplase] Other (See Comments)    dyspnea  . Warfarin Sodium Nausea And Vomiting and Rash      Past Medical History  Diagnosis Date  . ASCVD (arteriosclerotic cardiovascular disease)   . Hypertension   . Anemia   . Hyperparathyroidism   . Hashimoto thyroiditis   . GERD (gastroesophageal reflux disease)   . Morbid obesity   . Medically noncompliant   . ESRD (end stage renal disease)     Due to membranous GN dialysis 09/1996; peritoneal dialysis --? peitonitis;  difficult vascular access  . Dialysis patient     Past Surgical History  Procedure Laterality Date  . Tonsillectomy and adenoidectomy    . Thyroidectomy, partial      Resectin of left lobe with reimplantation in the forearm, small focus of papillary carcinoma incidentally noted at pathology - 2000 and Hashimoto's thyrdoiditis in 2001  . Btl  1992    Prior to Admission medications   Medication Sig Start Date End Date Taking? Authorizing Provider  albuterol (PROVENTIL HFA;VENTOLIN HFA) 108 (90 BASE) MCG/ACT inhaler Inhale 2 puffs into the lungs every 6 (six) hours as needed. Shortness of breath    Historical Provider, MD  calcium elemental as carbonate (BARIATRIC TUMS ULTRA) 400 MG tablet Chew 1,000 mg by mouth 3 (three) times daily. Patient also takes 1 tablet twice a day with snack    Historical Provider, MD  lanthanum (FOSRENOL) 1000 MG chewable tablet Chew 1,000-2,000 mg by mouth 3 (three) times daily with meals. 2 tablets with meals and 1 tablet with snack    Historical Provider, MD  levothyroxine (SYNTHROID, LEVOTHROID) 300 MCG tablet Take 300 mcg by mouth at bedtime.      Historical Provider, MD  liothyronine (CYTOMEL) 25 MCG tablet Take 25 mcg by mouth daily.    Historical Provider, MD  losartan (COZAAR) 50 MG tablet Take 50 mg by mouth daily.    Historical Provider, MD  multivitamin (RENA-VIT) TABS tablet Take 1 tablet by mouth daily.  Historical Provider, MD  omeprazole (PRILOSEC) 20 MG capsule Take 20 mg by mouth 2 (two) times daily.    Historical Provider, MD    Social History:  reports that she has quit smoking. She does not have any smokeless tobacco history on file. She reports that she does not drink alcohol or use illicit drugs.  Family History  Problem Relation Age of Onset  . Coronary artery disease Mother   . Kidney disease Father   . Diabetes Sister      All the positives are listed in BOLD  Review of Systems:  HEENT: Headache, blurred vision, runny nose,  sore throat Neck: Hypothyroidism, hyperthyroidism,,lymphadenopathy Chest : Shortness of breath, history of COPD, Asthma Heart : Chest pain, history of coronary arterey disease GI:  Nausea, vomiting, diarrhea, constipation, GERD GU: Dysuria, urgency, frequency of urination, hematuria Neuro: Stroke, seizures, syncope Psych: Depression, anxiety, hallucinations   Physical Exam: Blood pressure 99/63, pulse 73, temperature 95.4 F (35.2 C), temperature source Rectal, resp. rate 14, SpO2 100.00%. Constitutional:   Patient is a well-developed and well-nourished female in no acute distress and cooperative with exam. Head: Normocephalic and atraumatic Mouth: Mucus membranes moist Eyes: PERRL, EOMI, conjunctivae normal Neck: Supple, No Thyromegaly Cardiovascular: RRR, S1 normal, S2 normal Pulmonary/Chest: CTAB, no wheezes, rales, or rhonchi Abdominal: Soft. Patient has dressing in the left side of the abdomen,  non-distended, bowel sounds are normal, no masses, organomegaly, or guarding present.  Neurological: A&O x3, Strenght is normal and symmetric bilaterally, cranial nerve II-XII are grossly intact, no focal motor deficit, sensory intact to light touch bilaterally.  Extremities : No Cyanosis, Clubbing or Edema   Labs on Admission:  Results for orders placed during the hospital encounter of 03/26/14 (from the past 48 hour(s))  CBC WITH DIFFERENTIAL     Status: Abnormal   Collection Time    03/27/14 12:00 AM      Result Value Ref Range   WBC 8.5  4.0 - 10.5 K/uL   RBC 2.57 (*) 3.87 - 5.11 MIL/uL   Hemoglobin 7.4 (*) 12.0 - 15.0 g/dL   HCT 23.7 (*) 36.0 - 46.0 %   MCV 92.2  78.0 - 100.0 fL   MCH 28.8  26.0 - 34.0 pg   MCHC 31.2  30.0 - 36.0 g/dL   RDW 15.0  11.5 - 15.5 %   Platelets 360  150 - 400 K/uL   Neutrophils Relative % 67  43 - 77 %   Neutro Abs 5.8  1.7 - 7.7 K/uL   Lymphocytes Relative 21  12 - 46 %   Lymphs Abs 1.8  0.7 - 4.0 K/uL   Monocytes Relative 7  3 - 12 %    Monocytes Absolute 0.6  0.1 - 1.0 K/uL   Eosinophils Relative 4  0 - 5 %   Eosinophils Absolute 0.3  0.0 - 0.7 K/uL   Basophils Relative 1  0 - 1 %   Basophils Absolute 0.1  0.0 - 0.1 K/uL  BASIC METABOLIC PANEL     Status: Abnormal   Collection Time    03/27/14 12:00 AM      Result Value Ref Range   Sodium 138  137 - 147 mEq/L   Potassium 3.9  3.7 - 5.3 mEq/L   Chloride 99  96 - 112 mEq/L   CO2 21  19 - 32 mEq/L   Glucose, Bld 204 (*) 70 - 99 mg/dL   BUN 39 (*) 6 - 23 mg/dL  Creatinine, Ser 9.14 (*) 0.50 - 1.10 mg/dL   Calcium 6.4 (*) 8.4 - 10.5 mg/dL   Comment: RESULT REPEATED AND VERIFIED     CRITICAL RESULT CALLED TO, READ BACK BY AND VERIFIED WITH:      HAYMORE,R @ 0045 ON 03/27/14 BY WOODIE,J   GFR calc non Af Amer 5 (*) >90 mL/min   GFR calc Af Amer 5 (*) >90 mL/min   Comment: (NOTE)     The eGFR has been calculated using the CKD EPI equation.     This calculation has not been validated in all clinical situations.     eGFR's persistently <90 mL/min signify possible Chronic Kidney     Disease.  TYPE AND SCREEN     Status: None   Collection Time    03/27/14 12:00 AM      Result Value Ref Range   ABO/RH(D) A POS     Antibody Screen NEG     Sample Expiration 03/30/2014     Unit Number [id number]     Blood Component Type RBC LR PHER2     Unit division 00     Status of Unit ISSUED     Transfusion Status OK TO TRANSFUSE     Crossmatch Result Compatible     Unit Number [id number]     Blood Component Type RED CELLS,LR     Unit division 00     Status of Unit ISSUED     Transfusion Status OK TO TRANSFUSE     Crossmatch Result Compatible    ABO/RH     Status: None   Collection Time    03/27/14 12:00 AM      Result Value Ref Range   ABO/RH(D) A POS    PREPARE RBC (CROSSMATCH)     Status: None   Collection Time    03/27/14 12:00 AM      Result Value Ref Range   Order Confirmation ORDER PROCESSED BY BLOOD BANK    TROPONIN I     Status: None   Collection Time     03/27/14 12:00 AM      Result Value Ref Range   Troponin I <0.30  <0.30 ng/mL   Comment:            Due to the release kinetics of cTnI,     a negative result within the first hours     of the onset of symptoms does not rule out     myocardial infarction with certainty.     If myocardial infarction is still suspected,     repeat the test at appropriate intervals.    Radiological Exams on Admission: No results found.  Assessment/Plan Active Problems:   Bleeding from wound   Hemorrhagic shock  ESRD  Hemorrhagic shock Patient has received 2 units of PRBC, at this time she is not requiring any pressor support. The abdominal wound bleeding has stopped at this time. Will continue to monitor the patient in step down unit. We'll avoid giving too much IV fluids as patient missed her dialysis on Thursday and is scheduled to go for her dialysis on Saturday. If patient's blood pressure again drops then we'll give her a bolus of normal saline and start pressor support if needed.  Anemia Patient's hemoglobin was 7.4 at the time of arrival, will check CBC in the morning. Patient is status post 2 units of PRBC  Chronic left abdominal wound We'll consult wound care in a.m, if bleeding recurs patient may need  to be evaluated by plastic surgeon.  ESRD Patient Gets  dialysis Tuesday Thursday and Saturday, she missed her dialysis on Thursday due to pain. We'll consult nephrology in a.m. for hemodialysis on Saturday  Hypothyroidism Continue Synthroid  Code status: Patient is full code  Family discussion: No family at bedside   Time Spent on Admission: 79 minutes  [facility] Pager: [phone] 03/27/2014, 2:37 AM  If 7PM-7AM, please contact night-coverage  [email or web address]  Password [email or web address]

## 2014-03-27 NOTE — ED Notes (Signed)
CRITICAL VALUE ALERT  Critical value received:  Calcium 6.4  Date of notification:  03/27/14  Time of notification:  0047  Critical value read back:yes  Nurse who received alert:  Derek Mound, RN  MD notified (1st page):  Venora Maples  Time of first page:  [phone]  MD notified (2nd page):  Time of second page:  Responding MD:  Venora Maples  Time MD responded:  [phone]

## 2014-03-27 NOTE — Progress Notes (Signed)
CRITICAL VALUE ALERT  Critical value received: K+ 6.3   Date of notification: 03/27/14   Time of notification:  0645  Critical value read back:yes     Nurse who received alert:  Lynwood Dawley   MD notified (1st page):  [place]   Time of first page:  [phone]  MD notified (2nd page):  Time of second page:  Responding MD:    Time MD responded:

## 2014-03-27 NOTE — ED Notes (Signed)
Patients daughter left contact number. Stated to update her with any changes. Lori [name] [phone]

## 2014-03-27 NOTE — Progress Notes (Signed)
Surgery  Filed Vitals:   03/27/14 0800  BP: 107/73  Pulse:   Temp: 97.8 F (36.6 C)  Resp: 15    Chart reviewed.  In essence pt underwent debridement and had a bleed from debridement site which was treated by ER MD..with suture ligation and transfusion of 2 units PRBC.  There is no further bleeding.  I have asked the nursing staff to dress wound with xeroform gauze or vasaline gauze and use no adherent gauze directly to wound.Do not advise any debridement at present especially with dialysis planned to control hyperphosphatemia.  (Pt. has already undergone parathyroidectomy.)

## 2014-03-27 NOTE — Progress Notes (Signed)
The patient is a 43 year old woman with a history of secondary hyperthyroidism, end-stage renal disease, calciphylaxis, and atherosclerotic cardiovascular disease. She was admitted this morning by Dr. Darrick Meigs for bleeding of her chronic abdominal wound secondary to calciphylaxis. She was briefly seen and examined. Her medical record, laboratory studies, and vital signs were reviewed. I discussed the patient with Dr. Lowanda Foster who stated that presence of calciphylaxis carries a poor prognosis. She had already been treated with parathyroidectomy and calcium supplements and phosphorus binders. Also discussed the patient with general surgeon, Dr. Romona Curls. He provided recommendations for wound care. He did not recommend further debridement of the wound.  We'll continue current treatment ordered by Dr. Darrick Meigs. However, we'll discontinue IV pressors as her left pressure is better. We'll also add vancomycin and Fortaz for an associated abdominal wound infection. We'll add as needed morphine for pain. We'll start her renal diet.

## 2014-03-27 NOTE — Procedures (Signed)
   HEMODIALYSIS TREATMENT NOTE:  4 hour heparin-free dialysis completed via right chest wall tunneled catheter.  Skin is dry and flaky around exit site.  Tunnel is non-tender and without drainage, no erythema.  Goal NOT met:  Tolerated removal of only 1.8 liters; ultrafiltration was interrupted for 1.75 hours due to asymptomatic hypotension.  All blood was reinfused.    Rockwell Alexandria, RN, CDN

## 2014-03-27 NOTE — Progress Notes (Addendum)
INITIAL NUTRITION ASSESSMENT  DOCUMENTATION CODES Per approved criteria  -Obesity Unspecified   INTERVENTION: Recommend: Limit high phosphorus foods and take binders as ordered Encourage Mediterranean diet iIncluding food such as red bell peppers, cabbage cauliflower, garlic, onions, blueberries, red grapes, egg whites, fish and extra virgin olive oil.  NUTRITION DIAGNOSIS: Increased protein-energy needs related to ESRD with dialysis  as evidenced by nutrition guidelines .   Goal: Pt will advance diet to meet >90% of estimated nutrition needs.  Monitor:  Diet advancement, weight changes, labs and po intake  Reason for Assessment: Malnutrition Screen Score =  2  43 y.o. female  Admitting Dx: <principal problem not specified>  ASSESSMENT: Pt hx of HD past 16 yrs at  [facility]. She has an  abdominal wound related to calciphylaxis. She reports good appetite until 2 days ago. Currently asking her daughter for [facility]. Her usual meal pattern: eats her protein foods first. Reports usual dry wt 94 kg. She doesn't drink Nepro or take a protein modular but takes a Renal vitamin and eats protein bars when her Albumin is not at goal. Hx of hyperphosphatemia which she says improved with recent change in medication.   Nutrition Focused Physical Exam:  Subcutaneous Fat:  Orbital Region: WDL Upper Arm Region: WDL Thoracic and Lumbar Region: WDL  Muscle:  Temple Region: WDL Clavicle Bone Region: WDL Clavicle and Acromion Bone Region: WDL Scapular Bone Region:  Dorsal Hand: edemadous Patellar Region:  Anterior Thigh Region:  Posterior Calf Region:   Edema: mild    Height: Ht Readings from Last 1 Encounters:  03/27/14 5\' 3"  (1.6 m)    Weight: Wt Readings from Last 1 Encounters:  03/27/14 216 lb 4.3 oz (98.1 kg)    Ideal Body Weight: 52 kg  % Ideal Body Weight: 188%  Wt Readings from Last 10 Encounters:  03/27/14 216 lb 4.3 oz (98.1 kg)  12/13/12 208  lb 5.4 oz (94.501 kg)  08/12/12 211 lb (95.709 kg)  10/16/10 237 lb (107.502 kg)  08/21/10 235 lb (106.595 kg)  07/14/10 232 lb (105.235 kg)    Usual Body Weight: 94 kg  % Usual Body Weight: 100%  BMI:  Body mass index is 38.32 kg/(m^2).obesity class 2  Estimated Nutritional Needs: Kcal: 1700-1900 Protein: 94-116 gr Fluid: 1200 ml daily  Skin: abdominal wound  Diet Order: NPO  EDUCATION NEEDS: -No education needs identified at this time   Intake/Output Summary (Last 24 hours) at 03/27/14 1043 Last data filed at 03/27/14 0600  Gross per 24 hour  Intake  923.5 ml  Output      0 ml  Net  923.5 ml    Last BM: PTA  Labs:   Recent Labs Lab 03/27/14 03/27/14 0458 03/27/14 0843  NA 138 142  --   K 3.9 3.9  --   CL 99 103  --   CO2 21 23  --   BUN 39* 41*  --   CREATININE 9.14* 9.71*  --   CALCIUM 6.4* 6.3*  --   PHOS  --   --  5.3*  GLUCOSE 204* 96  --     CBG (last 3)  No results found for this basename: GLUCAP,  in the last 72 hours  Scheduled Meds: . levothyroxine  300 mcg Oral QHS  . liothyronine  25 mcg Oral QAC breakfast  . sodium chloride  3 mL Intravenous Q12H  . vancomycin  1,500 mg Intravenous Once    Continuous Infusions: .  sodium chloride 10 mL/hr at 03/27/14 [id number]    Past Medical History  Diagnosis Date  . ASCVD (arteriosclerotic cardiovascular disease)   . Hypertension   . Anemia   . Hyperparathyroidism   . Hashimoto thyroiditis   . GERD (gastroesophageal reflux disease)   . Morbid obesity   . Medically noncompliant   . ESRD (end stage renal disease)     Due to membranous GN dialysis 09/1996; peritoneal dialysis --? peitonitis; difficult vascular access  . Dialysis patient   . Hypothyroidism     Past Surgical History  Procedure Laterality Date  . Tonsillectomy and adenoidectomy    . Thyroidectomy, partial      Resectin of left lobe with reimplantation in the forearm, small focus of papillary carcinoma incidentally noted at  pathology - 2000 and Hashimoto's thyrdoiditis in 2001  . Btl  [address]    Colman Cater MS,RD,CSG,LDN Office: [phone] Pager: [phone]

## 2014-03-28 DIAGNOSIS — D62 Acute posthemorrhagic anemia: Secondary | ICD-10-CM

## 2014-03-28 LAB — CBC
HCT: 23.6 % — ABNORMAL LOW (ref 36.0–46.0)
HCT: 24.8 % — ABNORMAL LOW (ref 36.0–46.0)
Hemoglobin: 7.8 g/dL — ABNORMAL LOW (ref 12.0–15.0)
Hemoglobin: 8.2 g/dL — ABNORMAL LOW (ref 12.0–15.0)
MCH: 29.7 pg (ref 26.0–34.0)
MCH: 29.4 pg (ref 26.0–34.0)
MCHC: 33.1 g/dL (ref 30.0–36.0)
MCHC: 33.1 g/dL (ref 30.0–36.0)
MCV: 89.7 fL (ref 78.0–100.0)
MCV: 88.9 fL (ref 78.0–100.0)
Platelets: 261 10*3/uL (ref 150–400)
Platelets: 255 10*3/uL (ref 150–400)
RBC: 2.63 MIL/uL — ABNORMAL LOW (ref 3.87–5.11)
RBC: 2.79 MIL/uL — ABNORMAL LOW (ref 3.87–5.11)
RDW: 16.4 % — ABNORMAL HIGH (ref 11.5–15.5)
RDW: 16.4 % — ABNORMAL HIGH (ref 11.5–15.5)
WBC: 9.8 10*3/uL (ref 4.0–10.5)
WBC: 10.5 10*3/uL (ref 4.0–10.5)

## 2014-03-28 LAB — BASIC METABOLIC PANEL
BUN: 19 mg/dL (ref 6–23)
CO2: 28 mEq/L (ref 19–32)
Calcium: 7.6 mg/dL — ABNORMAL LOW (ref 8.4–10.5)
Chloride: 99 mEq/L (ref 96–112)
Creatinine, Ser: 5.99 mg/dL — ABNORMAL HIGH (ref 0.50–1.10)
GFR calc Af Amer: 9 mL/min — ABNORMAL LOW (ref >90)
GFR calc non Af Amer: 8 mL/min — ABNORMAL LOW (ref >90)
Glucose, Bld: 106 mg/dL — ABNORMAL HIGH (ref 70–99)
Potassium: 3.5 mEq/L — ABNORMAL LOW (ref 3.7–5.3)
Sodium: 138 mEq/L (ref 137–147)

## 2014-03-28 LAB — PHOSPHORUS: Phosphorus: 3.7 mg/dL (ref 2.3–4.6)

## 2014-03-28 LAB — PREPARE RBC (CROSSMATCH)

## 2014-03-28 MED ORDER — PANTOPRAZOLE SODIUM 40 MG PO TBEC
40.0000 mg | DELAYED_RELEASE_TABLET | Freq: Every day | ORAL | Status: DC
  Administered 2014-03-29 – 2014-03-30 (×2): 40 mg via ORAL
  Filled 2014-03-28 (×2): qty 1

## 2014-03-28 MED ORDER — LIDOCAINE HCL 4 % EX SOLN
Freq: Every day | CUTANEOUS | Status: DC | PRN
  Administered 2014-03-28 – 2014-03-30 (×2): 50 mL via TOPICAL
  Filled 2014-03-28: qty 50

## 2014-03-28 MED ORDER — LIDOCAINE 4 % EX CREA: TOPICAL_CREAM | Freq: Every day | CUTANEOUS | Status: DC | PRN

## 2014-03-28 NOTE — Progress Notes (Signed)
[facility] PROGRESS NOTE  Lori [name] [id number] DOB: 12/26/1970 DOA: 03/26/2014 PCP: No PCP Per Patient    Code Status: Full code Family Communication: Discussed with her daughter Disposition Plan: Discharge to home clinically improved.   Consultants:  Nephrology, Dr. Lowanda Foster  General surgery, Dr. Romona Curls  Procedures:  Hemodialysis  Antibiotics:  Tressie Ellis 03/27/14>>  Vancomycin 03/27/14>>  HPI/Subjective: The patient has exquisite abdominal wound the pain when dressings are being changed. She requests liquid lidocaine which has helped. She has no complaints of chest pain or shortness of breath. No nausea or vomiting.  Objective: Filed Vitals:   03/28/14 1320  BP:   Pulse:   Temp:   Resp: 15   temperature 99.0. Pulse 91. Respiratory rate 15. Blood pressure 83/52 (blood pressure with technical difficulties as the cough is on the patient's wrist or leg). Oxygen saturation 100%.    Intake/Output Summary (Last 24 hours) at 03/28/14 1644 Last data filed at 03/28/14 0800  Gross per 24 hour  Intake    580 ml  Output   1826 ml  Net  -1246 ml   Filed Weights   03/27/14 0305 03/27/14 1230 03/28/14 0500  Weight: 98.1 kg (216 lb 4.3 oz) 98.4 kg (216 lb 14.9 oz) 98.2 kg (216 lb 7.9 oz)    Exam:   General: Debilitated-appearing obese 43 year old woman who appears older to her stated age. No acute distress.  Cardiovascular: S1, S2, with a [id number] systolic murmur.  Respiratory: Clear anteriorly with decreased breath sounds in the bases.  Abdomen: There is a 6-7 cm circular left lower quadrant ulcer with a necrotic base and malodorous drainage, but not bleeding. There is a smaller approximately 3-4 cm circular ulcerated lesion/wound underneath her abdominal pannus with serious malodorous drainage. The wound bed is pink and tan in color. Overall, abdomen is obese, positive bowel sounds, and with exquisite tenderness over the left lower quadrant and pannus wounds,  particularly when dressings are adhered and taken off.  Musculoskeletal: No acute hot red joints.  Neurologic: She is alert and oriented x3. Cranial nerves II through XII are intact.  Data Reviewed: Basic Metabolic Panel:  Recent Labs Lab 03/27/14 03/27/14 0458 03/27/14 0843 03/28/14 0528  NA 138 142  --  138  K 3.9 3.9  --  3.5*  CL 99 103  --  99  CO2 21 23  --  28  GLUCOSE 204* 96  --  106*  BUN 39* 41*  --  19  CREATININE 9.14* 9.71*  --  5.99*  CALCIUM 6.4* 6.3*  --  7.6*  PHOS  --   --  5.3* 3.7   Liver Function Tests:  Recent Labs Lab 03/27/14 0458  AST 10  ALT <5  ALKPHOS 77  BILITOT 0.3  PROT 5.0*  ALBUMIN 2.3*   No results found for this basename: LIPASE, AMYLASE,  in the last 168 hours No results found for this basename: AMMONIA,  in the last 168 hours CBC:  Recent Labs Lab 03/27/14 03/27/14 0458 03/28/14 0528  WBC 8.5 15.2* 9.8  NEUTROABS 5.8  --   --   HGB 7.4* 8.7* 7.8*  HCT 23.7* 26.8* 23.6*  MCV 92.2 89.6 89.7  PLT 360 281 261   Cardiac Enzymes:  Recent Labs Lab 03/27/14  TROPONINI <0.30   BNP (last 3 results) No results found for this basename: PROBNP,  in the last 8760 hours CBG: No results found for this basename: GLUCAP,  in the last 168 hours  Recent Results (from the past 240 hour(s))  MRSA PCR SCREENING     Status: None   Collection Time    03/27/14  2:30 AM      Result Value Ref Range Status   MRSA by PCR NEGATIVE  NEGATIVE Final   Comment:            The GeneXpert MRSA Assay (FDA     approved for NASAL specimens     only), is one component of a     comprehensive MRSA colonization     surveillance program. It is not     intended to diagnose MRSA     infection nor to guide or     monitor treatment for     MRSA infections.     Studies: Dg Chest Port 1 View  03/27/2014   CLINICAL DATA:  43 year old female with leukocytosis  EXAM: PORTABLE CHEST - 1 VIEW  COMPARISON:  08/03/2010 chest radiograph  FINDINGS:  Cardiomegaly and surgical clips in the upper mediastinum again noted.  A right central venous catheter is again noted, with tips overlying the cavoatrial junction and upper right atrium.  There is no evidence of focal airspace disease, pulmonary edema, suspicious pulmonary nodule/mass, pleural effusion, or pneumothorax. No acute bony abnormalities are identified.  IMPRESSION: Cardiomegaly without evidence of acute cardiopulmonary disease.  Central venous catheter as described.   Electronically Signed   By: Hassan Rowan M.D.   On: 03/27/2014 12:13    Scheduled Meds: . cefTAZidime (FORTAZ)  IV  2 g Intravenous Q T,Th,Sa-HD  . levothyroxine  300 mcg Oral QHS  . liothyronine  25 mcg Oral QAC breakfast  . sodium chloride  3 mL Intravenous Q12H  . vancomycin  1,000 mg Intravenous Q T,Th,Sa-HD   Continuous Infusions: . sodium chloride 10 mL/hr at 03/28/14 0800   Assessment and plan:  Principal Problem:   Bleeding from wound Active Problems:   Hemorrhagic shock   Acute blood loss anemia   Chronic abdominal wound infection   Calciphylaxis   Anemia of other chronic disease   ATHEROSCLEROTIC CARDIOVASCULAR DISEASE   HYPERPARATHYROIDISM, HX OF   Hypocalcemia   ESRD on dialysis   Hypothyroidism  1. Left lower quadrant abdominal wall wound hemorrhage. Apparently, the patient had recently had some debridement of the abdominal wall wound a few days ago. The patient believes this is what precipitated the bleeding. She was seen at [facility] in [place] the day before admission. She was observed and apparently the bleeding had stopped and she was discharged to home. However, the bleeding returned and she presented to [facility]. In the ED, the patient underwent direct pressure and a figure of 8 stitch as well as lidocaine with epinephrine to control the bleeding. General surgeon Dr. Romona Curls had evaluated the patient and made recommendations for wound care. He does not recommend debridement  as it would precipitate further bleeding. Currently the bleeding has stopped for now. We'll continue to monitor.  2. Acute blood loss anemia and hemorrhagic shock. The patient was transfused 2 units of packed red blood cells during the first 12 hours of the hospitalization. She was treated with Levophed for several hours. Her blood pressure improved. Her hemoglobin increased from 7.4 to 8.7. It has fallen again to 7.8. We will give 1 additional unit of packed red blood cells today.  3. Chronic abdominal wound with infection. The patient's abdominal wounds are from calciphylaxis. The patient's abdominal wounds are draining and the drainage is malodorous.  She was started on Fortaz and vancomycin. She is afebrile. Her white blood cell count has improved from 15.2 to 9.8.  4. Calciphylaxis and hypocalcemia. The patient has severe secondary hyperparathyroidism. She has had parathyroidectomy for treatment. Per Dr. Lowanda Foster, calciphylaxis carries a poor prognosis. We will continue to provide calcium supplementation. Nephrology will dialyze her with a high calcium bath. Her calcium has improved. We'll continue to monitor.  5. Hypothyroidism. Continue Synthroid. TSH is pending. 6. End-stage renal disease. Dialysis per Dr. Lowanda Foster. 7. Atherosclerotic heart disease. Currently stable.  Time spent: 40 minutes.    Rexene Alberts  [facility] Pager [phone]. If 7PM-7AM, please contact night-coverage at [email or web address], password [facility] 03/28/2014, 4:44 PM  LOS: 2 days

## 2014-03-28 NOTE — Consult Note (Signed)
WOC wound consult note Reason for Consult:Asked by Dr. Darrick Meigs to offer suggestions for wound care; Surgical consult also requested.  Dr. Romona Curls saw and has recommended wound care. Please see his note dated 03/27/14.  [place] Nurse will not see for this reason. No orders were written for the care outlined by Dr. Romona Curls.  Will ask nursing to phone and confirm orders are written for the continued care of this patient's wound per Dr. Hulan Amato orders. Wound type:Calciphylaxis related wound on abdomen Pressure Ulcer POA: No WOC nursing team did not see and will not follow, but will remain available to this patient, the nursing and medical team.  Please re-consult if needed. Thanks, Maudie Flakes, MSN, RN, [place], [place], [place] [phone])

## 2014-03-28 NOTE — Progress Notes (Signed)
Subjective: Interval History: has no complaint of difficulty in breathing. Patient is somewhat somnolent but arousable. Presently she offers no complaints..  Objective: Vital signs in last 24 hours: Temp:  [97.2 F (36.2 C)-99 F (37.2 C)] 99 F (37.2 C) (05/03 0411) Pulse Rate:  [59-91] 63 (05/02 1956) Resp:  [10-39] 13 (05/03 0600) BP: (71-129)/(20-90) 91/69 mmHg (05/03 0600) SpO2:  [100 %] 100 % (05/02 1956) Weight:  [98.2 kg (216 lb 7.9 oz)-98.4 kg (216 lb 14.9 oz)] 98.2 kg (216 lb 7.9 oz) (05/03 0500) Weight change: 0.3 kg (10.6 oz)  Intake/Output from previous day: 05/02 0701 - 05/03 0700 In: 1140 [P.O.:360; I.V.:230; IV Piggyback:550] Out: 1826  Intake/Output this shift:    Generally patient is somnolent. She is arousable. Patient doesn't seem to be in any apparent distress. Chest is clear to auscultation Heart exam regular rate and rhythm Abdomen positive bowel sound and presently she is a dressing on her abdomen and there is no sign of bleeding. Extremities no edema  Lab Results:  Recent Labs  03/27/14 0458 03/28/14 0528  WBC 15.2* 9.8  HGB 8.7* 7.8*  HCT 26.8* 23.6*  PLT 281 261   BMET:  Recent Labs  03/27/14 0458 03/28/14 0528  NA 142 138  K 3.9 3.5*  CL 103 99  CO2 23 28  GLUCOSE 96 106*  BUN 41* 19  CREATININE 9.71* 5.99*  CALCIUM 6.3* 7.6*   No results found for this basename: PTH,  in the last 72 hours Iron Studies: No results found for this basename: IRON, TIBC, TRANSFERRIN, FERRITIN,  in the last 72 hours  Studies/Results: Dg Chest Port 1 View  03/27/2014   CLINICAL DATA:  43 year old female with leukocytosis  EXAM: PORTABLE CHEST - 1 VIEW  COMPARISON:  08/03/2010 chest radiograph  FINDINGS: Cardiomegaly and surgical clips in the upper mediastinum again noted.  A right central venous catheter is again noted, with tips overlying the cavoatrial junction and upper right atrium.  There is no evidence of focal airspace disease, pulmonary edema,  suspicious pulmonary nodule/mass, pleural effusion, or pneumothorax. No acute bony abnormalities are identified.  IMPRESSION: Cardiomegaly without evidence of acute cardiopulmonary disease.  Central venous catheter as described.   Electronically Signed   By: Hassan Rowan M.D.   On: 03/27/2014 12:13    I have reviewed the patient's current medications.  Assessment/Plan: Problem #1 end-stage renal disease she status post hemodialysis yesterday. Presently her potassium is normal. Problem #2 metabolic bone disease: Her calcium is low this is due to secondary to parathyroid surgery. Patient was dialyzed with high calcium bath and her calcium is 7.6 has improved. Her phosphorus is in range. Problem #3 anemia: This a combination of anemia of chronic disease and also from bleeding. Problem #4 history of calciphylaxis: Presents with abdominal wound. Patient on antibiotics Problem #5 hypothyroidism Problem #6 history of hypo-tension: Her blood pressure remains low. Problem #7 generalized atherosclerotic disease Problem #8 history of GERD Plan: We'll check her basic metabolic panel and CBC in the morning. Since her phosphorus is normal we'll hold her binders. We'll make a decision about dialysis tomorrow.  I agree with blood transfusion at least one unit today.   LOS: 2 days   [place] [address] Alonia Dibuono 03/28/2014,8:11 AM

## 2014-03-29 LAB — CBC
HCT: 24.4 % — ABNORMAL LOW (ref 36.0–46.0)
Hemoglobin: 8 g/dL — ABNORMAL LOW (ref 12.0–15.0)
MCH: 29.1 pg (ref 26.0–34.0)
MCHC: 32.8 g/dL (ref 30.0–36.0)
MCV: 88.7 fL (ref 78.0–100.0)
Platelets: 245 10*3/uL (ref 150–400)
RBC: 2.75 MIL/uL — ABNORMAL LOW (ref 3.87–5.11)
RDW: 16.7 % — ABNORMAL HIGH (ref 11.5–15.5)
WBC: 8.9 10*3/uL (ref 4.0–10.5)

## 2014-03-29 LAB — TYPE AND SCREEN
ABO/RH(D): A POS
Antibody Screen: NEGATIVE
Unit division: 0
Unit division: 0
Unit division: 0

## 2014-03-29 LAB — BASIC METABOLIC PANEL
BUN: 32 mg/dL — ABNORMAL HIGH (ref 6–23)
CO2: 27 mEq/L (ref 19–32)
Calcium: 7.2 mg/dL — ABNORMAL LOW (ref 8.4–10.5)
Chloride: 99 mEq/L (ref 96–112)
Creatinine, Ser: 8.16 mg/dL — ABNORMAL HIGH (ref 0.50–1.10)
GFR calc Af Amer: 6 mL/min — ABNORMAL LOW (ref >90)
GFR calc non Af Amer: 5 mL/min — ABNORMAL LOW (ref >90)
Glucose, Bld: 86 mg/dL (ref 70–99)
Potassium: 3.6 mEq/L — ABNORMAL LOW (ref 3.7–5.3)
Sodium: 139 mEq/L (ref 137–147)

## 2014-03-29 LAB — TSH: TSH: 95.24 u[IU]/mL — ABNORMAL HIGH (ref 0.350–4.500)

## 2014-03-29 LAB — PARATHYROID HORMONE, INTACT (NO CA): PTH: 312.5 pg/mL — ABNORMAL HIGH (ref 14.0–72.0)

## 2014-03-29 MED ORDER — EPOETIN ALFA 10000 UNIT/ML IJ SOLN
14000.0000 [IU] | INTRAMUSCULAR | Status: DC
  Filled 2014-03-29: qty 2

## 2014-03-29 MED ORDER — EPOETIN ALFA 10000 UNIT/ML IJ SOLN
14000.0000 [IU] | INTRAMUSCULAR | Status: DC
  Administered 2014-03-30: 14000 [IU] via INTRAVENOUS
  Filled 2014-03-29: qty 2

## 2014-03-29 NOTE — Progress Notes (Signed)
Subjective: Interval History: . Patient offers no complaints. Denies any difficulty in breathing and her appetite is reasonable.  Objective: Vital signs in last 24 hours: Temp:  [97.4 F (36.3 C)-98.9 F (37.2 C)] 98.7 F (37.1 C) (05/04 0800) Pulse Rate:  [92] 92 (05/03 2202) Resp:  [10-25] 10 (05/04 0500) BP: (73-105)/(38-84) 91/55 mmHg (05/04 0500) SpO2:  [96 %] 96 % (05/03 2202) Weight:  [99.1 kg (218 lb 7.6 oz)] 99.1 kg (218 lb 7.6 oz) (05/04 0500) Weight change: 0.7 kg (1 lb 8.7 oz)  Intake/Output from previous day: 05/03 0701 - 05/04 0700 In: 172.5 [I.V.:160; Blood:12.5] Out: -  Intake/Output this shift:    Generally patient is somnolent. She is arousable. Patient doesn't seem to be in any apparent distress. Chest is clear to auscultation Heart exam regular rate and rhythm Abdomen positive bowel sound and presently she is a dressing on her abdomen and bleeding seems to have stopped Extremities no edema  Lab Results:  Recent Labs  03/28/14 2243 03/29/14 0459  WBC 10.5 8.9  HGB 8.2* 8.0*  HCT 24.8* 24.4*  PLT 255 245   BMET:   Recent Labs  03/28/14 0528 03/29/14 0459  NA 138 139  K 3.5* 3.6*  CL 99 99  CO2 28 27  GLUCOSE 106* 86  BUN 19 32*  CREATININE 5.99* 8.16*  CALCIUM 7.6* 7.2*   No results found for this basename: PTH,  in the last 72 hours Iron Studies: No results found for this basename: IRON, TIBC, TRANSFERRIN, FERRITIN,  in the last 72 hours  Studies/Results: Dg Chest Port 1 View  03/27/2014   CLINICAL DATA:  43 year old female with leukocytosis  EXAM: PORTABLE CHEST - 1 VIEW  COMPARISON:  08/03/2010 chest radiograph  FINDINGS: Cardiomegaly and surgical clips in the upper mediastinum again noted.  A right central venous catheter is again noted, with tips overlying the cavoatrial junction and upper right atrium.  There is no evidence of focal airspace disease, pulmonary edema, suspicious pulmonary nodule/mass, pleural effusion, or pneumothorax.  No acute bony abnormalities are identified.  IMPRESSION: Cardiomegaly without evidence of acute cardiopulmonary disease.  Central venous catheter as described.   Electronically Signed   By: Hassan Rowan M.D.   On: 03/27/2014 12:13    I have reviewed the patient'[name] current medications.  Assessment/Plan: Problem #1 end-stage renal disease she status post hemodialysis the beforeyesterday. Presently does not have uremic sign and symptoms Problem #2 metabolic bone disease: Her calcium is low this is due to secondary to parathyroid surgery. Patient was dialyzed with high calcium bath and her calcium is 7.2 has but her  phosphorus is in range. Problem #3 anemia: This a combination of anemia of chronic disease and also from bleeding. Her hemoglobin has declined slightly. [name]/p blood trasnfusion on admission Problem #4 history of calciphylaxis: Presents with abdominal wound. Patient on antibiotics Problem #5 hypothyroidism Problem #6 history of hypotension: Her systolic  blood pressure is low normal Problem #7 generalized atherosclerotic disease Problem #8 history of GERD Plan: We'll check her basic metabolic panel and CBC in the morning. Since her phosphorus is normal we'll hold her binders. We'll dialyze patient in am which is her regula schedule We continue to hold heparin  we will use 2k /3.5 calcium bath We will check her Basic metabolic panel and phosphorus in the morning Epogen 14000 [place] iv after each dialysis   LOS: 3 days   Lori [name] Lori [name] 03/29/2014,9:09 AM

## 2014-03-29 NOTE — Progress Notes (Signed)
[facility]  NAME:  Lori [name], Lori [name]                       ACCOUNT NO.:  MEDICAL RECORD NO.:  [id number]      DATE OF BIRTH:  1971/05/14  PHYSICIAN:  Theodoro Kos, DO       VISIT DATE:  03/22/2014                                  OFFICE VISIT   HISTORY OF PRESENT ILLNESS:  The patient is a 43 year old female who is here for evaluation of 2 ulcers on her abdominal area that are believed to be calciphylaxis.  She has been using dry dressings on it right now and they do not seem to be improving.  PAST MEDICAL HISTORY:  Positive for thyroid cancer, parathyroid disease, end-stage renal disease, hypertension, esophageal ring.  PAST SURGICAL HISTORY:  Includes parathyroidectomy and thyroidectomy.  MEDICATIONS:  Include aspirin, albuterol, cyclobenzaprine, doxycycline, Flagyl, omeprazole, Percocet, Nephro-Vite, Plavix, Renvela, Synthroid, and vitamin D.  ALLERGIES:  She is allergic to bee venom, Coumadin, and Activase.  REVIEW OF SYSTEMS:  Otherwise, negative.  PHYSICAL EXAMINATION:  On exam, she is alert and oriented.  She is a bit tearful.  Pupils are equal.  Extraocular muscles are intact.  She does not have any cervical lymphadenopathy.  She has multiple bruising due to her kidney disease and thin skin.  The wounds are on the abdomen and those are noted in the note.  Debridement was done on the lateral left wound, and she was unable to tolerate much more in regard to pain, so we will check a pre-albumin, encourage healthy eating and vitamins.  We will wait to see what the pre-albumin is and how this performs over the next few weeks to decide if surgical intervention is the right course of action, and we will see her back in a week.     Theodoro Kos, DO     CS/MEDQ  D:  03/22/2014  T:  03/23/2014  Job:  [id number]

## 2014-03-29 NOTE — Progress Notes (Signed)
[facility] PROGRESS NOTE  Lori [name] [id number] DOB: 03/03/1971 DOA: 03/26/2014 PCP: No PCP Per Patient    Code Status: Full code Family Communication: Discussed with her daughter Disposition Plan: Discharge to home clinically improved.   Consultants:  Nephrology, Dr. Lowanda Foster  General surgery, Dr. Romona Curls  Procedures:  Hemodialysis  Antibiotics:  Tressie Ellis 03/27/14>>  Vancomycin 03/27/14>>  HPI/Subjective: The patient has less abdominal wound pain with the use of liquid lidocaine.  Objective: Filed Vitals:   03/29/14 0900  BP: 113/75  Pulse:   Temp:   Resp: 16   pulse 92. Temperature 98.7. Oxygen saturation 96%.    Intake/Output Summary (Last 24 hours) at 03/29/14 1106 Last data filed at 03/28/14 2300  Gross per 24 hour  Intake  162.5 ml  Output      0 ml  Net  162.5 ml   Filed Weights   03/27/14 1230 03/28/14 0500 03/29/14 0500  Weight: 98.4 kg (216 lb 14.9 oz) 98.2 kg (216 lb 7.9 oz) 99.1 kg (218 lb 7.6 oz)    Exam:   General: Debilitated-appearing obese 43 year old woman who appears older to her stated age. No acute distress.  Cardiovascular: S1, S2, with a [id number] systolic murmur.  Respiratory: Clear anteriorly with decreased breath sounds in the bases.  Abdomen: There is a 6-7 cm circular left lower quadrant ulcer with a necrotic base and malodorous drainage, but not bleeding. There is a smaller approximately 3-4 cm circular ulcerated lesion/wound underneath her abdominal pannus with serious malodorous drainage. The wound bed is pink and tan in color. Overall, abdomen is obese, positive bowel sounds, and with exquisite tenderness over the left lower quadrant and pannus wounds, particularly when dressings are adhered and taken off.  Musculoskeletal: No acute hot red joints.  Neurologic: She is alert and oriented x3. Cranial nerves II through XII are intact.  Data Reviewed: Basic Metabolic Panel:  Recent Labs Lab 03/27/14 03/27/14 0458  03/27/14 0843 03/28/14 0528 03/29/14 0459  NA 138 142  --  138 139  K 3.9 3.9  --  3.5* 3.6*  CL 99 103  --  99 99  CO2 21 23  --  28 27  GLUCOSE 204* 96  --  106* 86  BUN 39* 41*  --  19 32*  CREATININE 9.14* 9.71*  --  5.99* 8.16*  CALCIUM 6.4* 6.3*  --  7.6* 7.2*  PHOS  --   --  5.3* 3.7  --    Liver Function Tests:  Recent Labs Lab 03/27/14 0458  AST 10  ALT <5  ALKPHOS 77  BILITOT 0.3  PROT 5.0*  ALBUMIN 2.3*   No results found for this basename: LIPASE, AMYLASE,  in the last 168 hours No results found for this basename: AMMONIA,  in the last 168 hours CBC:  Recent Labs Lab 03/27/14 03/27/14 0458 03/28/14 0528 03/28/14 2243 03/29/14 0459  WBC 8.5 15.2* 9.8 10.5 8.9  NEUTROABS 5.8  --   --   --   --   HGB 7.4* 8.7* 7.8* 8.2* 8.0*  HCT 23.7* 26.8* 23.6* 24.8* 24.4*  MCV 92.2 89.6 89.7 88.9 88.7  PLT 360 281 261 255 245   Cardiac Enzymes:  Recent Labs Lab 03/27/14  TROPONINI <0.30   BNP (last 3 results) No results found for this basename: PROBNP,  in the last 8760 hours CBG: No results found for this basename: GLUCAP,  in the last 168 hours  Recent Results (from the past 240 hour(s))  MRSA  PCR SCREENING     Status: None   Collection Time    03/27/14  2:30 AM      Result Value Ref Range Status   MRSA by PCR NEGATIVE  NEGATIVE Final   Comment:            The GeneXpert MRSA Assay (FDA     approved for NASAL specimens     only), is one component of a     comprehensive MRSA colonization     surveillance program. It is not     intended to diagnose MRSA     infection nor to guide or     monitor treatment for     MRSA infections.     Studies: No results found.  Scheduled Meds: . cefTAZidime (FORTAZ)  IV  2 g Intravenous Q T,Th,Sa-HD  . epoetin alfa  14,000 Units Intravenous Once per day on Mon Wed Fri  . levothyroxine  300 mcg Oral QHS  . liothyronine  25 mcg Oral QAC breakfast  . pantoprazole  40 mg Oral Daily  . sodium chloride  3 mL  Intravenous Q12H  . vancomycin  1,000 mg Intravenous Q T,Th,Sa-HD   Continuous Infusions: . sodium chloride 10 mL/hr at 03/28/14 2300   Assessment and plan:  Principal Problem:   Bleeding from wound Active Problems:   Hemorrhagic shock   Acute blood loss anemia   Chronic abdominal wound infection   Calciphylaxis   Anemia of other chronic disease   ATHEROSCLEROTIC CARDIOVASCULAR DISEASE   HYPERPARATHYROIDISM, HX OF   Hypocalcemia   ESRD on dialysis   Hypothyroidism  1. Left lower quadrant abdominal wall wound hemorrhage. Apparently, the patient had recently had some debridement of the abdominal wall wound a few days ago. The patient believes this is what precipitated the bleeding. She was seen at [facility] in [place] the day before admission. She was observed and apparently the bleeding had stopped and she was discharged to home. However, the bleeding returned and she presented to [facility]. In the ED, the patient underwent direct pressure and a figure of 8 stitch as well as lidocaine with epinephrine to control the bleeding. General surgeon Dr. Romona Curls had evaluated the patient and made recommendations for wound care. He does not recommend debridement as it would precipitate further bleeding. Currently the bleeding has stopped for now. We'll continue to monitor.  2. Acute blood loss anemia and hemorrhagic shock. The patient was transfused 2 units of packed red blood cells during the first 12 hours of the hospitalization. She was treated with Levophed for several hours. Her blood pressure improved. Her hemoglobin increased from 7.4 to 8.7, but fell again to 7.8. She was transfused another unit of packed red blood cells for a total of 3 units. Her hemoglobin is stable at 8.0.  3. Chronic abdominal wound with infection. The patient's abdominal wounds are from calciphylaxis. The patient's abdominal wounds are draining and the drainage is malodorous; overall better today.  She was started on Fortaz and vancomycin. She is afebrile. Her white blood cell count has improved from 15.2 to 8.9.  4. Calciphylaxis and hypocalcemia. The patient has severe secondary hyperparathyroidism. She has had parathyroidectomy for treatment. Per Dr. Lowanda Foster, calciphylaxis carries a poor prognosis. We will continue to provide calcium supplementation. Nephrology is dialyzing her with a high calcium bath. Her calcium has improved. We'll continue to monitor. 5. Hypothyroidism. Continue Synthroid. TSH is pending. 6. End-stage renal disease. Dialysis per Dr. Lowanda Foster. 7. Atherosclerotic heart  disease. Currently stable. 8. We'll transfer her out of the ICU and order PT evaluation and out of bed to the chair.  Time spent: 35 minutes.    Rexene Alberts  [facility] Pager [phone]. If 7PM-7AM, please contact night-coverage at [email or web address], password [facility] 03/29/2014, 11:06 AM  LOS: 3 days

## 2014-03-29 NOTE — Progress Notes (Signed)
Utilization Review Complete  

## 2014-03-29 NOTE — Progress Notes (Signed)
Pt a/o.vss. Resting in bed. Dressing to abdomen clean, dry and intact, changed this am. No complaints of any distress. Report called to L.Chyrl Civatte, Therapist, sports. Pt to be transferred to room 333.

## 2014-03-30 DIAGNOSIS — E039 Hypothyroidism, unspecified: Secondary | ICD-10-CM

## 2014-03-30 LAB — BASIC METABOLIC PANEL
BUN: 42 mg/dL — ABNORMAL HIGH (ref 6–23)
CO2: 25 mEq/L (ref 19–32)
Calcium: 7.3 mg/dL — ABNORMAL LOW (ref 8.4–10.5)
Chloride: 98 mEq/L (ref 96–112)
Creatinine, Ser: 10.08 mg/dL — ABNORMAL HIGH (ref 0.50–1.10)
GFR calc Af Amer: 5 mL/min — ABNORMAL LOW (ref >90)
GFR calc non Af Amer: 4 mL/min — ABNORMAL LOW (ref >90)
Glucose, Bld: 92 mg/dL (ref 70–99)
Potassium: 3.9 mEq/L (ref 3.7–5.3)
Sodium: 138 mEq/L (ref 137–147)

## 2014-03-30 LAB — CBC
HCT: 24.7 % — ABNORMAL LOW (ref 36.0–46.0)
Hemoglobin: 8 g/dL — ABNORMAL LOW (ref 12.0–15.0)
MCH: 28.7 pg (ref 26.0–34.0)
MCHC: 32.4 g/dL (ref 30.0–36.0)
MCV: 88.5 fL (ref 78.0–100.0)
Platelets: 257 10*3/uL (ref 150–400)
RBC: 2.79 MIL/uL — ABNORMAL LOW (ref 3.87–5.11)
RDW: 16.4 % — ABNORMAL HIGH (ref 11.5–15.5)
WBC: 8.2 10*3/uL (ref 4.0–10.5)

## 2014-03-30 LAB — PHOSPHORUS: Phosphorus: 4.6 mg/dL (ref 2.3–4.6)

## 2014-03-30 MED ORDER — CEPHALEXIN 500 MG PO CAPS: 500.0000 mg | ORAL_CAPSULE | Freq: Two times a day (BID) | ORAL | Status: DC

## 2014-03-30 MED ORDER — SODIUM CHLORIDE 0.9 % IV SOLN: 100.0000 mL | INTRAVENOUS | Status: DC | PRN

## 2014-03-30 MED ORDER — HYDROCODONE-ACETAMINOPHEN 5-325 MG PO TABS
1.0000 | ORAL_TABLET | Freq: Once | ORAL | Status: AC
  Administered 2014-03-30: 1 via ORAL
  Filled 2014-03-30: qty 1

## 2014-03-30 MED ORDER — LIOTHYRONINE SODIUM 25 MCG PO TABS: 25.0000 ug | ORAL_TABLET | Freq: Every day | ORAL | Status: DC

## 2014-03-30 MED ORDER — LIDOCAINE HCL 4 % EX SOLN: Freq: Every day | CUTANEOUS | Status: DC | PRN

## 2014-03-30 MED ORDER — OXYCODONE-ACETAMINOPHEN 5-325 MG PO TABS: 1.0000 | ORAL_TABLET | Freq: Four times a day (QID) | ORAL | Status: DC | PRN

## 2014-03-30 NOTE — Evaluation (Signed)
Physical Therapy Evaluation Patient Details Name: Lori [name] MRN: [id number] DOB: 1971-03-12 Today's Date: 03/30/2014   History of Present Illness  43 year old female with a history of calciphylaxis, who presented to the ED with left abdominal wound. Patient was seen by plastic surgeon 4 days ago, Dr. Migdalia Dk cut off scab 4 days ago, and patient was told to come back in 2 weeks. Patient has this abdominal wound since January of this year. Yesterday around 1 PM bleeding again began and patient went to [facility] in [place] where she was observed and bleeding stopped in few hours. Patient was discharged and was told to keep pressure to avoid bleeding. When patient arrived home she started bleeding again and then came to the [facility] emergency department.  Clinical Impression  Pt is a 43 yo who has not been up since 03/27/2014.  Pt moves slowly but is able to complete all activity on her own with verbal encouragement.  PT will benefit from [facility] therapy to maximize her functional ability.    Follow Up Recommendations Home health PT    Equipment Recommendations  None recommended by PT    Recommendations for Other Services       Precautions / Restrictions Precautions Precautions: None Restrictions Weight Bearing Restrictions: No      Mobility  Bed Mobility Overal bed mobility: Modified Independent                Transfers Overall transfer level: Modified independent                  Ambulation/Gait Ambulation/Gait assistance: Modified independent (Device/Increase time) Ambulation Distance (Feet): 40 Feet Assistive device: Rolling walker (2 wheeled)     Gait velocity interpretation: Below normal speed for age/gender              Pertinent Vitals/Pain 7/10 with bed mobility; pain subsided after this.    Home Living Family/patient expects to be discharged to:: Private residence Living Arrangements: Spouse/significant other Available Help at Discharge:  Family Type of Home: House Home Access: Stairs to enter Entrance Stairs-Rails: Right Entrance Stairs-Number of Steps: 2 Home Layout: One level Home Equipment: Environmental consultant - 2 wheels      Prior Function Level of Independence: Independent                  Extremity/Trunk Assessment        Lower Extremity Assessment: Generalized weakness       Communication   Communication: No difficulties  Cognition Arousal/Alertness: Awake/alert Behavior During Therapy: WFL for tasks assessed/performed Overall Cognitive Status: Within Functional Limits for tasks assessed           Exercises General Exercises - Lower Extremity Ankle Circles/Pumps: Both;10 reps Quad Sets: Both;10 reps Heel Slides: Both;10 reps Mini-Sqauts: 5 reps      Assessment/Plan    PT Assessment Patient needs continued PT services  PT Diagnosis Difficulty walking   PT Problem List Decreased activity tolerance  PT Treatment Interventions Gait training;Therapeutic exercise   PT Goals (Current goals can be found in the Care Plan section)      Frequency Min 3X/week           End of Session Equipment Utilized During Treatment: Gait belt   Patient left: in chair;with call bell/phone within reach           Time: 1445-1530 PT Time Calculation (min): 45 min   Charges:   PT Evaluation $Initial PT Evaluation Tier I: 1 Procedure  PT G Codes:          Leeroy Cha 03/30/2014, 3:31 PM

## 2014-03-30 NOTE — Care Management Note (Signed)
    Page 1 of 1   03/30/2014     2:59:56 PM CARE MANAGEMENT NOTE 03/30/2014  Patient:  Lori [name], Lori [name]   Account Number:  [id number]  Date Initiated:  03/30/2014  Documentation initiated by:  Claretha Cooper  Subjective/Objective Assessment:   Pt admitted from home where she lives with her daughter. Pt declines [place] RN stating her daughter assists with wound care. Pt also states she does not think she needs HH PT.     Action/Plan:   Anticipated DC Date:  03/30/2014   Anticipated DC Plan:  HOME/SELF CARE      DC Planning Services  CM consult      Choice offered to / List presented to:             Status of service:  Completed, signed off Medicare Important Message given?  YES (If response is "NO", the following Medicare IM given date fields will be blank) Date Medicare IM given:  03/30/2014 Date Additional Medicare IM given:    Discharge Disposition:  HOME/SELF CARE  Per UR Regulation:    If discussed at Long Length of Stay Meetings, dates discussed:    Comments:  03/30/14 Claretha Cooper RN BSN CM

## 2014-03-30 NOTE — Progress Notes (Signed)
Subjective: Interval History: . Denies any difficulty in breathing and her appetite is reasonable.Patient with some abdominal pain this morning when she tried to get up.  Objective: Vital signs in last 24 hours: Temp:  [97.9 F (36.6 C)-98.7 F (37.1 C)] 98.3 F (36.8 C) (05/05 0415) Pulse Rate:  [72-90] 82 (05/05 0415) Resp:  [10-20] 18 (05/05 0415) BP: (86-141)/(47-88) 100/68 mmHg (05/05 0415) SpO2:  [98 %-100 %] 98 % (05/05 0415) Weight:  [99.837 kg (220 lb 1.6 oz)] 99.837 kg (220 lb 1.6 oz) (05/05 0426) Weight change: 0.737 kg (1 lb 10 oz)  Intake/Output from previous day:   Intake/Output this shift:    Generally patient is somnolent. She is arousable. Patient doesn't seem to be in any apparent distress. Chest is clear to auscultation Heart exam regular rate and rhythm Abdomen positive bowel sound and presently no signs of bleeding from her abdominal wound Extremities no edema  Lab Results:  Recent Labs  03/29/14 0459 03/30/14 0616  WBC 8.9 8.2  HGB 8.0* 8.0*  HCT 24.4* 24.7*  PLT 245 257   BMET:   Recent Labs  03/29/14 0459 03/30/14 0616  NA 139 138  K 3.6* 3.9  CL 99 98  CO2 27 25  GLUCOSE 86 92  BUN 32* 42*  CREATININE 8.16* 10.08*  CALCIUM 7.2* 7.3*    Recent Labs  03/27/14 0843  PTH 312.5*   Iron Studies: No results found for this basename: IRON, TIBC, TRANSFERRIN, FERRITIN,  in the last 72 hours  Studies/Results: No results found.  I have reviewed the patient's current medications.  Assessment/Plan: Problem #1 end-stage renal disease she status post hemodialysis on [place].. Presently does not have uremic sign and symptoms Problem #2 metabolic bone disease: Her calcium is low but stable. her  Phosphorus how ever is in range is in range. Her PTH is in range. Patient s/p parathyroidectomy . Her PTH seems to be improving  Problem #3 anemia: Her hemoglobin is low but stable Problem #4 history of calciphylaxis: Presents with abdominal  wound. Patient on antibiotics. She is afebrile and normal white blood cell count. Problem #5 hypothyroidism on synthroid Problem #6 history of hypotension: Her systolic  blood pressure is low normal Problem #7 generalized atherosclerotic disease Problem #8 history of GERD Plan: We will dialyze patient today. No heparin/continue with epogen We will high calcium bath   LOS: 4 days   Lori [name] 03/30/2014,8:14 AM

## 2014-03-30 NOTE — Discharge Summary (Signed)
Physician Discharge Summary  Lori [name] [id number] DOB: [birth year] DOA: 03/26/2014  PCP: No PCP Per Patient  Admit date: 03/26/2014 Discharge date: 03/30/2014  Time spent: Greater than 30 minutes  Recommendations for Outpatient Follow-up:  1. Free T4 was pending at the time of hospital discharge. Her TSH is greater than 95. She is instructed to call to make an appointment with endocrinologist, Dr. Dorris [name] as soon as possible. (His office was closed at the time of this dictation). 2. The patient will followup with wound care physician, Dr. Migdalia [name] as previously scheduled. 3. Hemodialysis Tuesday, Thursday, Saturday.   Discharge Diagnoses:  1. Bleeding from abdominal wound. 2. Hemorrhagic shock from bleeding from the abdominal wound. 3. Acute blood loss anemia superimposed on anemia of end-stage renal disease. Status post 3 units of packed red blood cell transfusions. 4. Abdominal wounds x2, likely from chronic calciphylaxis. 5. Probable chronic abdominal wound infection. 6. Calciphylaxis associated with hypocalcemia. 7. Secondary hyperparathyroid. PTH elevated at 312.5. Status post parathyroidectomy in the past. 8. Hypothyroidism. TSH 95.2. 9. End-stage renal disease, on hemodialysis. 10. Obesity. 11. Coronary artery disease, stable.   Discharge Condition: Improved.  Diet recommendation: Renal diet/heart healthy.  Filed Weights   03/29/14 0500 03/30/14 0426 03/30/14 0856  Weight: 99.1 kg (218 lb 7.6 oz) 99.837 kg (220 lb 1.6 oz) 99.8 kg (220 lb 0.3 oz)    History of present illness:  The patient is a 43 year old woman with a history of secondary hyperparathyroidism, end-stage renal disease, calciphylaxis, and atherosclerotic cardiovascular disease. She presented to the emergency department on 03/27/2014 with a complaint of bleeding of one of her abdominal wounds. In the ED, she was hypotensive with a blood pressure 71/40. Her hemoglobin was 7.4. Her creatinine was 9.14 and her BUN was  39. She was admitted for further evaluation and management.  Hospital Course:   1. Left lower quadrant abdominal wall wound hemorrhage. Apparently, the patient had recently had some debridement of the larger abdominal wall wound a few days ago. The patient believes this is what precipitated the bleeding. She was seen at [facility] in [place] the day before admission. She was observed and apparently the bleeding had stopped and she was discharged to home. However, the bleeding returned and she presented to [place]. In the ED, the patient underwent direct pressure and a figure of 8 stitch as well as lidocaine with epinephrine to control the bleeding.  General surgeon Dr. Romona [name] was consulted and evaluated the patient. He made recommendations for wound care. He did not recommend debridement as it would precipitate further bleeding. The wound bleeding has stopped. Because of exquisite tenderness when the dressings were taken off, liquid lidocaine was applied which significantly reduced her discomfort during dressing changes. She advised to followup with her wound care physician Dr. Migdalia [name] as scheduled in a couple of weeks. 2. Acute blood loss anemia and hemorrhagic shock. The patient was transfused 2 units of packed red blood cells during the first 12 hours of the hospitalization. She was treated with Levophed for several hours. Her blood pressure improved. Her hemoglobin increased from 7.4 to 8.7, but fell again to 7.8. She was transfused another unit of packed red blood cells for a total of 3 units. Her hemoglobin remained stable at 8.0 the 24-48 hours. 3. Chronic abdominal wound with infection. The patient's abdominal wounds are from calciphylaxis. The patient's abdominal wounds' drainage was malodorous; overall better today. She was started on Fortaz and vancomycin. No cultures were obtained. She remained  afebrile. Her white blood cell count normalized. She was discharged on 7 more  days of Keflex.  4. Calciphylaxis and hypocalcemia. The patient has severe secondary hyperparathyroidism. She has had parathyroidectomy for treatment. Per Dr. Lowanda [name], calciphylaxis carries a poor prognosis. During dialysis, Dr. Lowanda [name] changed the dialysate to improve her serum calcium. It improved from 6.3 to7.3 at the time of discharge. 5. Hypothyroidism. She was continued on Synthroid. She had been treated with Cytomel in the past, but she does not recall why it was discontinued. TSH was ordered. The results became apparent at the time of discharge. It was greater than 95. Compliance is questionable, however, the patient denies missing any doses of Synthroid. Rather than increasing the dose of Synthroid, Cytomel was restarted at 25 mcg daily. Make an appointment for the patient with endocrinologist, Dr. Dorris [name], but his office was closed. The patient was instructed to call his office tomorrow for further evaluation. She had seen endocrinologist Lori [name] in [place], but had difficulty getting transportation to [facility]. 6. End-stage renal disease. Dialysis was continued  per Dr. Lowanda [name].  7. Atherosclerotic heart disease. Remained stable. Her troponin I was negative x1.   Procedures:  Hemodialysis  Consultations:  Nephrologist, Dr. Lowanda [name]   General surgeon, Dr. Romona [name]  Discharge Exam: Filed Vitals:   03/30/14 1539  BP: 104/82  Pulse: 105  Temp: 66 F (36.7 C)  Resp: 18    General: Debilitated-appearing obese 43 year old woman who appears older than her stated age. No acute distress.  Cardiovascular: S1, S2, with a A999333 systolic murmur.  Respiratory: Clear anteriorly with decreased breath sounds in the bases.  Abdomen: There is a 6-7 cm circular left lower quadrant ulcer with a necrotic base and malodorous drainage, but not bleeding. There is a smaller approximately 3-4 cm circular ulcerated lesion/wound underneath her abdominal pannus with serious malodorous drainage. The  wound bed is pink and tan in color. Overall, abdomen is obese, positive bowel sounds, and with moderate tenderness over the left lower quadrant and pannus wounds, particularly when dressings are adhered and taken off.  Musculoskeletal: No acute hot red joints.  Neurologic: She is alert and oriented x3. Cranial nerves II through XII are intact.   Discharge Instructions You were cared for by a hospitalist during your hospital stay. If you have any questions about your discharge medications or the care you received while you were in the hospital after you are discharged, you can call the unit and asked to speak with the hospitalist on call if the hospitalist that took care of you is not available. Once you are discharged, your primary care physician will handle any further medical issues. Please note that NO REFILLS for any discharge medications will be authorized once you are discharged, as it is imperative that you return to your primary care physician (or establish a relationship with a primary care physician if you do not have one) for your aftercare needs so that they can reassess your need for medications and monitor your lab values.  Discharge Orders   Future Appointments Provider Department Dept Phone   04/12/2014 9:00 AM [facility] [phone]   Future Orders Complete By Expires   Diet - low sodium heart healthy  As directed    Discharge instructions  As directed    Discharge wound care:  As directed    Increase activity slowly  As directed        Medication List  albuterol 108 (90 BASE) MCG/ACT inhaler  Commonly known as:  PROVENTIL HFA;VENTOLIN HFA  Inhale 2 puffs into the lungs every 6 (six) hours as needed. Shortness of breath     cephALEXin 500 MG capsule  Commonly known as:  KEFLEX  Take 1 capsule (500 mg total) by mouth 2 (two) times daily. ANTIBIOTIC TO BE TAKEN FOR 7 MORE DAYS.     levothyroxine 300 MCG tablet   Commonly known as:  SYNTHROID, LEVOTHROID  Take 300 mcg by mouth at bedtime.     lidocaine 4 % external solution  Commonly known as:  XYLOCAINE  Apply topically daily as needed for mild pain. Apply to wound site prior to dressing changes as needed for pain relief.     liothyronine 25 MCG tablet  Commonly known as:  CYTOMEL  Take 1 tablet (25 mcg total) by mouth daily before breakfast.     multivitamin Tabs tablet  Take 1 tablet by mouth daily.     omeprazole 20 MG capsule  Commonly known as:  PRILOSEC  Take 20 mg by mouth 2 (two) times daily.     oxyCODONE-acetaminophen 5-325 MG per tablet  Commonly known as:  ROXICET  Take 1 tablet by mouth every 6 (six) hours as needed for moderate pain or severe pain.     sevelamer 800 MG tablet  Commonly known as:  RENAGEL  Take 1,600-3,200 mg by mouth 3 (three) times daily with meals. 4 with meals 2 with snacks       Allergies  Allergen Reactions  . Bee Pollen Anaphylaxis  . Activase [Alteplase] Other (See Comments)    dyspnea  . Warfarin Sodium Nausea And Vomiting and Rash       Follow-up Information   Follow up with NIDA,GEBRESELASSIE, MD. (THYROID SPECIALIST-CALL AS SOON AS POSSIBLE FOR THE NEXT AVAILABLE APPOINTMENT.)    Specialty:  Endocrinology   Contact information:   [place] [address] [phone]       Follow up with [facility], DO. (FOLLOWUP AS PREVIOUSLY SCHEDULED.)    Specialty:  Plastic Surgery   Contact information:   [place] [address] [phone]       Follow up with Estanislado Emms, MD. (FOLLOWUP AT HEMODIALYSIS.)    Specialty:  Nephrology   Contact information:   [place] [address] [phone]        The results of significant diagnostics from this hospitalization (including imaging, microbiology, ancillary and laboratory) are listed below for reference.    Significant Diagnostic Studies: Dg Chest Port  1 View  03/27/2014   CLINICAL DATA:  43 year old female with leukocytosis  EXAM: PORTABLE CHEST - 1 VIEW  COMPARISON:  08/03/2010 chest radiograph  FINDINGS: Cardiomegaly and surgical clips in the upper mediastinum again noted.  A right central venous catheter is again noted, with tips overlying the cavoatrial junction and upper right atrium.  There is no evidence of focal airspace disease, pulmonary edema, suspicious pulmonary nodule/mass, pleural effusion, or pneumothorax. No acute bony abnormalities are identified.  IMPRESSION: Cardiomegaly without evidence of acute cardiopulmonary disease.  Central venous catheter as described.   Electronically Signed   By: Hassan Rowan M.D.   On: 03/27/2014 12:13    Microbiology: Recent Results (from the past 240 hour(s))  MRSA PCR SCREENING     Status: None  Collection Time    03/27/14  2:30 AM      Result Value Ref Range Status   MRSA by PCR NEGATIVE  NEGATIVE Final   Comment:            The GeneXpert MRSA Assay (FDA     approved for NASAL specimens     only), is one component of a     comprehensive MRSA colonization     surveillance program. It is not     intended to diagnose MRSA     infection nor to guide or     monitor treatment for     MRSA infections.     Labs: Basic Metabolic Panel:  Recent Labs Lab 03/27/14 03/27/14 0458 03/27/14 0843 03/28/14 0528 03/29/14 0459 03/30/14 0616  NA 138 142  --  138 139 138  K 3.9 3.9  --  3.5* 3.6* 3.9  CL 99 103  --  99 99 98  CO2 21 23  --  28 27 25   GLUCOSE 204* 96  --  106* 86 92  BUN 39* 41*  --  19 32* 42*  CREATININE 9.14* 9.71*  --  5.99* 8.16* 10.08*  CALCIUM 6.4* 6.3*  --  7.6* 7.2* 7.3*  PHOS  --   --  5.3* 3.7  --  4.6   Liver Function Tests:  Recent Labs Lab 03/27/14 0458  AST 10  ALT <5  ALKPHOS 77  BILITOT 0.3  PROT 5.0*  ALBUMIN 2.3*   No results found for this basename: LIPASE, AMYLASE,  in the last 168 hours No results found for this basename: AMMONIA,  in the last  168 hours CBC:  Recent Labs Lab 03/27/14 03/27/14 0458 03/28/14 0528 03/28/14 2243 03/29/14 0459 03/30/14 0616  WBC 8.5 15.2* 9.8 10.5 8.9 8.2  NEUTROABS 5.8  --   --   --   --   --   HGB 7.4* 8.7* 7.8* 8.2* 8.0* 8.0*  HCT 23.7* 26.8* 23.6* 24.8* 24.4* 24.7*  MCV 92.2 89.6 89.7 88.9 88.7 88.5  PLT 360 281 261 255 245 257   Cardiac Enzymes:  Recent Labs Lab 03/27/14  TROPONINI <0.30   BNP: BNP (last 3 results) No results found for this basename: PROBNP,  in the last 8760 hours CBG: No results found for this basename: GLUCAP,  in the last 168 hours     Signed:  Rexene Alberts  [facility] 03/30/2014, 3:45 PM

## 2014-03-30 NOTE — Progress Notes (Signed)
Areas where  Suture  Were  Red  And inflamed looking  Cath. Intact . Removed  Per procedure .   pt  Informed  To watch  For  Signs  Of  Infection and  Not  To hesitate  For  Follow up

## 2014-03-30 NOTE — Procedures (Signed)
   HEMODIALYSIS TREATMENT NOTE:  4 hour heparin-free dialysis completed via right chest tunneled catheter.  Goal met:  Tolerated removal of 2.3 liters without interruption in ultrafiltration.  All blood was reinfused.  Catheter exit site unremarkable. Pt was given EPO 14,000u, Vancomycin 1g, and Tazicef 2g with HD. Report given to Leroy Kennedy, RN.  Jeyson Deshotel L. Delfina Schreurs, RN, CDN

## 2014-03-30 NOTE — Progress Notes (Signed)
PT Cancellation Note  Patient Details Name: Lori [name] MRN: [id number] DOB: June 21, 1971   Cancelled Treatment:     PT order received. Pt is currently unavailable due to a HD treatment. Will attempt evaluation tomorrow.   Colon Flattery Ernestine Conrad 03/30/2014, 11:56 AM

## 2014-03-30 NOTE — Progress Notes (Signed)
ANTIBIOTIC CONSULT NOTE - follow up  Pharmacy Consult for Vancomycin & Tressie Ellis Indication: cellulitis  Allergies  Allergen Reactions  . Bee Pollen Anaphylaxis  . Activase [Alteplase] Other (See Comments)    dyspnea  . Warfarin Sodium Nausea And Vomiting and Rash   Patient Measurements: Height: 5\' 3"  (160 cm) Weight: 220 lb 0.3 oz (99.8 kg) IBW/kg (Calculated) : 52.4  Vital Signs: Temp: 97.8 F (36.6 C) (05/05 0856) Temp src: Oral (05/05 0856) BP: 130/77 mmHg (05/05 1000) Pulse Rate: 64 (05/05 1000) Intake/Output from previous day:   Intake/Output from this shift:    Labs:  Recent Labs  03/28/14 0528 03/28/14 2243 03/29/14 0459 03/30/14 0616  WBC 9.8 10.5 8.9 8.2  HGB 7.8* 8.2* 8.0* 8.0*  PLT 261 255 245 257  CREATININE 5.99*  --  8.16* 10.08*   Estimated Creatinine Clearance: 8 ml/min (by C-G formula based on Cr of 10.08). No results found for this basename: VANCOTROUGH, Corlis Leak, VANCORANDOM, [place], GENTPEAK, GENTRANDOM, TOBRATROUGH, TOBRAPEAK, TOBRARND, AMIKACINPEAK, AMIKACINTROU, AMIKACIN,  in the last 72 hours   Microbiology: Recent Results (from the past 720 hour(s))  MRSA PCR SCREENING     Status: None   Collection Time    03/27/14  2:30 AM      Result Value Ref Range Status   MRSA by PCR NEGATIVE  NEGATIVE Final   Comment:            The GeneXpert MRSA Assay (FDA     approved for NASAL specimens     only), is one component of a     comprehensive MRSA colonization     surveillance program. It is not     intended to diagnose MRSA     infection nor to guide or     monitor treatment for     MRSA infections.   Medical History: Past Medical History  Diagnosis Date  . ASCVD (arteriosclerotic cardiovascular disease)   . Hypertension   . Anemia   . Hyperparathyroidism   . Hashimoto thyroiditis   . GERD (gastroesophageal reflux disease)   . Morbid obesity   . Medically noncompliant   . 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 peitonitis; difficult vascular access  . Dialysis patient   . Hypothyroidism   . Calciphylaxis   . Chronic abdominal wound infection    Medications:  Scheduled:  . cefTAZidime (FORTAZ)  IV  2 g Intravenous Q T,Th,Sa-HD  . epoetin alfa  14,000 Units Intravenous Q T,Th,Sa-HD  . levothyroxine  300 mcg Oral QHS  . liothyronine  25 mcg Oral QAC breakfast  . pantoprazole  40 mg Oral Daily  . sodium chloride  3 mL Intravenous Q12H  . vancomycin  1,000 mg Intravenous Q T,Th,Sa-HD   Assessment: 43 yo F with ESRD requiring HD on TTS.  She has abdominal wound for >year which was debrided earlier this week and she's had subsequent bleeding.   She is currently afebrile, WBC has normalized.  Asked to initiate Vancomycin and Ceftazidime for cellulitis.   Vancomycin 5/2>> Tressie Ellis 5/2 >>  Goal of Therapy:  Pre-HD level 15-25 mcg/ml  Plan:   Continue Vancomycin 1gm after each HD  Check pre-HD level at steady state  Continue Fortaz 2gm IV after each HD  Monitor labs & patient progress  [employer] 03/30/2014,10:33 AM

## 2014-03-30 NOTE — Progress Notes (Signed)
Patient to be discharged but she still has femoral central line in place. Contacted Dr. Darrick Meigs to find out if the patient will be d/c'd with the line. Orders received to discontinue the femoral line. Will contact AC to have line removed.

## 2014-03-31 LAB — T4, FREE: Free T4: 0.37 ng/dL — ABNORMAL LOW (ref 0.80–1.80)

## 2014-03-31 NOTE — Progress Notes (Signed)
UR chart review completed.  

## 2014-04-12 ENCOUNTER — Encounter (HOSPITAL_BASED_OUTPATIENT_CLINIC_OR_DEPARTMENT_OTHER): Payer: Medicare Other | Attending: Plastic Surgery

## 2014-04-12 DIAGNOSIS — L988 Other specified disorders of the skin and subcutaneous tissue: Secondary | ICD-10-CM | POA: Insufficient documentation

## 2014-04-12 DIAGNOSIS — N289 Disorder of kidney and ureter, unspecified: Secondary | ICD-10-CM | POA: Insufficient documentation

## 2014-04-12 DIAGNOSIS — L98499 Non-pressure chronic ulcer of skin of other sites with unspecified severity: Secondary | ICD-10-CM | POA: Insufficient documentation

## 2014-04-13 NOTE — Progress Notes (Signed)
[facility]  NAME:  Lori [name], Lori [name]                  ACCOUNT NO.:  [id number]  MEDICAL RECORD NO.:  [id number]      DATE OF BIRTH:  1971/10/05  PHYSICIAN:  Theodoro Kos, DO       VISIT DATE:  04/12/2014                                  OFFICE VISIT   The patient is a 43 year old female who is here for followup on her abdominal ulcer.  She has been using Santyl on the area with some improvement.  She has multiple medical conditions including kidney disease.  She does not smoke.  There has been no change in her review of systems, other than a visit to the hospital due to bleeding from the site which was sutured.  No change in medications.  On exam, she is alert, oriented, and cooperative.  The area is very sensitive, but she is not in any acute distress.  She is pleasant.  She seems to understand her condition.  She has an ash in color to her overall skin but her pupils are equal.  There is no stress in breathing, and her pulse is regular.  Her abdomen is large and then of course tender at the site of the wound.  The larger left wound was debrided and that is noted in the chart.  Recommendation is to continue with Santyl.  She can shower with Dial soap, increase protein, multivitamins, and certainly keep the area clean.  I had attempted to do aggressive debridement because calciphylaxis has a tendency to get worse with aggressive debridement that is why we were doing this formally.  I will see her back in 1 month.     Theodoro Kos, DO     CS/MEDQ  D:  04/12/2014  T:  04/13/2014  Job:  [id number]

## 2014-04-20 DIAGNOSIS — E441 Mild protein-calorie malnutrition: Secondary | ICD-10-CM | POA: Insufficient documentation

## 2014-05-10 ENCOUNTER — Encounter (HOSPITAL_BASED_OUTPATIENT_CLINIC_OR_DEPARTMENT_OTHER): Payer: Medicare Other | Attending: Plastic Surgery

## 2014-06-14 ENCOUNTER — Encounter (HOSPITAL_BASED_OUTPATIENT_CLINIC_OR_DEPARTMENT_OTHER): Payer: Medicare Other | Attending: Plastic Surgery

## 2014-06-14 DIAGNOSIS — L988 Other specified disorders of the skin and subcutaneous tissue: Secondary | ICD-10-CM | POA: Insufficient documentation

## 2014-06-14 DIAGNOSIS — L98499 Non-pressure chronic ulcer of skin of other sites with unspecified severity: Secondary | ICD-10-CM | POA: Diagnosis not present

## 2014-07-12 ENCOUNTER — Encounter (HOSPITAL_BASED_OUTPATIENT_CLINIC_OR_DEPARTMENT_OTHER): Payer: Medicare Other | Attending: Plastic Surgery

## 2014-07-12 DIAGNOSIS — L98499 Non-pressure chronic ulcer of skin of other sites with unspecified severity: Secondary | ICD-10-CM | POA: Insufficient documentation

## 2014-07-12 DIAGNOSIS — L988 Other specified disorders of the skin and subcutaneous tissue: Secondary | ICD-10-CM | POA: Diagnosis not present

## 2014-07-26 DIAGNOSIS — L98499 Non-pressure chronic ulcer of skin of other sites with unspecified severity: Secondary | ICD-10-CM | POA: Diagnosis not present

## 2014-07-26 DIAGNOSIS — L988 Other specified disorders of the skin and subcutaneous tissue: Secondary | ICD-10-CM | POA: Diagnosis not present

## 2014-07-27 DIAGNOSIS — R0602 Shortness of breath: Secondary | ICD-10-CM | POA: Insufficient documentation

## 2015-03-15 NOTE — Discharge Summary (Signed)
PATIENT NAME:  Lori [name], Lori [name] MR#:  [id number] DATE OF BIRTH:  1971-01-25  DATE OF ADMISSION:  08/12/2012 DATE OF DISCHARGE:  08/21/2012  FINAL DIAGNOSES:  1. Infected right thigh arteriovenous graft.  2. End-stage renal disease  3. Coronary artery disease.  4. Cerebrovascular accident.  5. Hypertension.  6. Gastroesophageal reflux disease.  PROCEDURES PERFORMED DURING THIS ADMISSION: Removal of an infected right thigh AV graft and placement of a temporary femoral dialysis catheter with ultrasound guidance done by Dr. Hortencia Pilar on [id number]   COMPLICATIONS: The patient had an incident of atrial fibrillation.   CONSULTATION: Dr. Marga Hoots COURSE: The patient was admitted. She underwent her procedure on A999333 without complication. Following surgery she was transferred from the postanesthesia care unit to the surgical floor where her vital signs remained stable for the next 48 hours. The patient did receive preoperative and postoperative IV antibiotics and required no blood transfusion during this admission. She did have an incident of atrial fibrillation for which she was moved in to the Critical Care Unit for observation. She remained there for approximately 24 hours. She was distally and neurovascularly intact. Her Wound VAC was intact and showed no real drainage. Wound VAC was discontinued on postop day #4. Dry dressing was applied. The patient also had a right external jugular cuffed tunneled dialysis catheter placed on 08/20/2012. She progressed in her treatment and was deemed stable for discharge on 08/21/2012.   LABORATORY DATA: As of 08/21/2012, hemoglobin was 9.9, hematocrit was 29, and chemistries appeared stable.    DISCHARGE MEDICATIONS:  1. Omeprazole 20 mg. 2. Synthroid 300 mcg.  3. Fosrenol 1000 mg.  4. Losartan 50 mg.  5. Aspirin 81 mg.  6. Vicodin 5/300.  7. Tramadol 50 mg.  8. Albuterol.  9. Norco 5/325.   DISPOSITION: The patient is  discharged home in stable condition on 08/21/2012.   DISCHARGE INSTRUCTIONS:  1. She will continue weightbearing to tolerance on her thigh.  2. She does have a follow-up appointment set up with our office in one month postoperatively and understands to call in the interim with any problems or questions.   ____________________________ Lane Hacker, PA-C jmk:[id number] D: 09/04/2012 09:03:57 ET T: 09/04/2012 09:23:26 ET JOB#: [id number]  cc: Lane Hacker, PA-C, <Dictator> Lane Hacker PA ELECTRONICALLY SIGNED 09/04/2012 9:44

## 2015-03-15 NOTE — H&P (Signed)
Subjective/Chief Complaint bleeding from right thigh graft    History of Present Illness The patient is a 44 year old woman who has been on dialysis for many years.  By her history she presented to dialysis on Saturday and was noted to have a "spider bite" over the arterial limb of the graft.  shehas a low grade temp as well.  Sunday shefelt poorly and subsequently had an episode of bleeding which stopped.  She rebled on Monday and subsequently was brought to the ER and admitted to [facility].  She is transfered here for further care.    Past History She is followed by [facility] from [place]. ESRD on dialysis for 16 yrs Multiple access surgeries SVC syndrome   Past Med/Surgical Hx:  Dialysis Tues. Thurs. Sat.:   MRSA greater than 6 months ago:   Coronary Artery Disease:   Migraines:   CVA/Stroke:   Osteoarthritis:   Asthma:   Anemia:   gerd:   thyroid and para thyroidectomy:   hypertension:   Dialysis tues thurs sat:   kidney failure:   Right Chest Catheter for Dialyasis:   Tubal Ligation:   Tonsillectomy:   ALLERGIES:  Bee Stings: Anaphylaxis  Alteplase: N/V/Diarrhea, Dizzy/Fainting  Coumadin: Other  HOME MEDICATIONS: Medication Instructions Status  omeprazole 20 mg oral delayed release capsule 1 cap(s) orally 2 times a day Active  Synthroid 300 mcg (0.3 mg) oral tablet 1 tab(s) orally once a day (at bedtime) Active  Ventolin HFA 2 puff(s) inhaled in the morning and as needed Active  Fosrenol 1000 mg oral tablet, chewable 1 tab(s) chewed 3 times a day with meals Active  Cytomel 25 mcg oral tablet 0.5 tab(s) orally 2 times a day Active  Nephro-Vite 1 tab(s) orally once a day (in the morning) Active  Percocet 7.5/325 oral tablet 1-2 tab(s) orally every 6 hours as needed for pain Active  losartan 50 mg oral tablet 1 tab(s) orally once a day Active  aspirin 81 mg oral tablet 1 tab(s) orally once a day Active  Vicodin 5 mg-300 mg oral tablet 1  tab(s) orally every 12 hours, As Needed- for Pain  Active  liothyronine 25 mcg oral tablet 0.5 tab(s) orally 2 times a day Active   Family and Social History:   Family History Non-Contributory    Social History negative tobacco, negative ETOH, negative Illicit drugs   Review of Systems:   Fever/Chills Yes    Cough No    Sputum No    Abdominal Pain No    Diarrhea No    Constipation No    Nausea/Vomiting No    SOB/DOE No    Chest Pain No    Telemetry Reviewed NSR    Dysuria No   Physical Exam:   GEN well developed, no acute distress    HEENT pale conjunctivae, PERRL, hearing intact to voice, moon facies consistent with SVC syndrome    NECK supple  trachea midline    RESP normal resp effort  no use of accessory muscles    CARD regular rate  No LE edema  no JVD    VASCULAR ACCESS AV graft present  Good bruit  Good thrill  Purulent drainage  necrotic area over the arterial limb consistent with infection    ABD denies tenderness  soft    EXTR negative cyanosis/clubbing, negative edema    SKIN normal to palpation, No rashes, positive ulcers    NEURO cranial nerves intact, follows commands  PSYCH alert, A+O to time, place, person     Assessment/Admission Diagnosis 1.  Infected AV graft right thigh            will plan excision 2.  End stage renal disease            consult Nephrology             no evidence fo volume overload  3.  SVC syndrome            configuring access will be problematic 4,  Hypothyroidism             continue synthroid    Plan level 2 admit   Electronic Signatures: Hortencia Pilar (MD)  (Signed 17-Sep-13 17:13)  Authored: CHIEF COMPLAINT and HISTORY, PAST MEDICAL/SURGIAL HISTORY, ALLERGIES, HOME MEDICATIONS, FAMILY AND SOCIAL HISTORY, REVIEW OF SYSTEMS, PHYSICAL EXAM, ASSESSMENT AND PLAN   Last Updated: 17-Sep-13 17:13 by Hortencia Pilar (MD)

## 2015-03-15 NOTE — Op Note (Signed)
PATIENT NAME:  Lori [name], Lori [name] MR#:  [id number] DATE OF BIRTH:  02-Jun-1971  DATE OF PROCEDURE:  08/13/2012  PREOPERATIVE DIAGNOSIS: Infected right thigh arteriovenous graft.   POSTOPERATIVE DIAGNOSIS: Infected right thigh arteriovenous graft.   PROCEDURE PERFORMED:  1. Removal of infected right thigh arteriovenous graft.  2. Placement of a temporary femoral dialysis catheter with ultrasound guidance.   SURGEON: Katha Cabal, MD   ANESTHESIA: General via endotracheal intubation.   FLUIDS: Per anesthesia record.   ESTIMATED BLOOD LOSS: 100 mL.   SPECIMEN: Segments of the infected graft removed and photographed for the permanent record.   INDICATIONS: Ms. Wendorf is a 44 year old woman who presented to the Emergency Room at [facility] with bleeding from her graft. At that time she was admitted, told that she had a spider bite over the graft site, but subsequently has been transferred here for further care. At the time of my assessment it appears the patient has an ulcerated area overlying her graft consistent with a graft infection  related to an insect sting or bite. Because of this, she will require excision. Blood cultures that were drawn a day ago at [facility] were positive for staph. The risks and benefits were reviewed, all questions answered. The patient has agreed to proceed.   DESCRIPTION OF PROCEDURE: The patient is taken to the Operating Room and placed in the supine position. After adequate general anesthesia is induced and appropriate invasive monitors are placed, she is positioned supine and her right thigh and left thigh are prepped and draped in sterile fashion. Ultrasound is placed in a sterile sleeve. The left common femoral vein is identified. It is echolucent and compressible, indicating patency. Under direct ultrasound visualization, the common femoral artery is accessed with a Seldinger needle. Image is recorded for the permanent record. A J-wire is advanced, a  counterincision is made, a dilator is passed over the wire, and a triple-lumen temporary dialysis catheter is advanced without difficulty. All three lumens aspirate and flush easily and are secured to the skin of the thigh with 2-0 nylon, and a sterile dressing is applied.   Attention is then turned to the right thigh. The previous groin incision is then reopened and carried down to expose the graft. The graft is then dissected down to the suture line on the arterial and venous portions. Clamps are placed across the graft, and the first arterial is clamped and then ligated. A small cuff of Gore-Tex is left, and this is oversewn with CV-3 suture. Then 0 Ethibond is used to ligate the graft more proximally, and an approximately 2.5 cm segment is resected. In a similar fashion, the venous anastomosis is created. Both stumps of the Gore-Tex are then oversewn with Vicryl in multiple layers.   Working toward the infected area, two counterincisions are then created, one over the venous and one over the arterial, and a segment of graft is removed. These are then oversewn with 3-0 Vicryl followed by staples at the end. Two more counterincisions are made, and again segments of graft are removed. Subsequently one incision is made in the apex, and the final segment of graft is removed. These last three incisions are left open. After washing the incisions and closing the groin incision in multiple layers, staples are used to approximate the skin in the groin as well as those first two counterincisions. VAC wound dressing is then applied.   The patient tolerated the procedure well. There were no immediate complications. Sponge and needle counts  are correct, and she is taken to the recovery area in excellent condition.  ____________________________ Katha Cabal, MD ggs:cbb D: 08/15/2012 10:35:00 ET T: 08/15/2012 12:51:33 ET JOB#: [id number]  cc: Katha Cabal, MD, <Dictator> Munsoor Lilian Kapur, MD Katha Cabal MD ELECTRONICALLY SIGNED 08/26/2012 14:06

## 2015-03-15 NOTE — Op Note (Signed)
PATIENT NAME:  Lori [name], Lori [name] MR#:  [id number] DATE OF BIRTH:  1970-12-07  DATE OF PROCEDURE:  08/20/2012  PREOPERATIVE DIAGNOSES:  1. Infected right thigh loop graft.  2. Complication AV dialysis device.  3. Superior vena cava syndrome.  4. End-stage renal disease requiring hemodialysis.  5. Morbid obesity.   POSTOPERATIVE DIAGNOSES:  1. Infected right thigh loop graft.  2. Complication AV dialysis device.  3. Superior vena cava syndrome.  4. End-stage renal disease requiring hemodialysis.  5. Morbid obesity.     PROCEDURES PERFORMED:  1. Contrast injection left jugular system.  2. Introduction catheter to superior vena cava.  3. Percutaneous transluminal angioplasty superior vena cava to 12 mm.  4. Insertion of cuffed tunneled dialysis catheter, right external jugular approach.   SURGEON: Katha Cabal, M.D.   SEDATION: Precedex drip with supplemental fentanyl. Continuous ECG, pulse oximetry and cardiopulmonary monitoring was performed throughout the entire procedure by the interventional radiology nurse. Total sedation time was one hour, 30 minutes.   ACCESS:  1. 5 French sheath left IJ catheter.  2. 12 French sheath, right external jugular.   CONTRAST USED: Isovue 35 mL.  FLUOROSCOPY TIME: 2.8 minutes.   INDICATIONS: Lori [name] is a 44 year old woman with a very complicated history who presented for infected right thigh AV graft. This has been excised. The patient has been maintained on vancomycin and blood cultures are now negative x2 for greater than three days. She is therefore undergoing attempted insertion of a tunneled catheter so that she can be maintained on hemodialysis. The risks and benefits have been reviewed. All questions answered. The patient has agreed to proceed.   DESCRIPTION OF PROCEDURE: The patient is taken to the special procedures suite and placed in the supine position at and after cursory examination of the neck, a left internal jugular vein is  identified, which is fairly normal in size. No internal jugular vein is identified on the right side although an external jugular vein is noted. Because the internal appears relatively normal on the left, the patient is positioned supine with her neck rotated to the right, slightly extended and the left neck and chest wall are prepped and draped in a sterile fashion. Ultrasound is placed in a sterile sleeve. Ultrasound is utilized secondary to lack of appropriate landmarks and to avoid vascular injury. Under direct ultrasound visualization, the jugular vein is identified. It is compressible and homogeneous indicating patency. The image is recorded and a microneedle is inserted under direct visualization, microwire followed by micro sheath. The sheath is flushed with saline and then a stopcock is placed. Hand injection of contrast is then utilized to demonstrate that the jugular and subclavian are patent. There is a very large 8 mm azygous vein that courses down along the center of the chest and fills collaterals that reconstitute the inferior vena cava. This may actually support a graft from the left brachial axillary situation. However, the innominate and superior vena cava are nonvisualized throughout their entire course and, therefore, it does not appear that a tunneled catheter would be a viable option in this approach. Therefore, the sheath is pulled, pressure is held, and subsequently the patient is repositioned with her neck rotated to the left and slightly extended and the right neck and chest wall are prepped and draped in a sterile fashion.   Ultrasound is still within the sterile sleeve and is therefore used to localize the external. External is echolucent and homogeneous, easily compressible indicating patency. Image is recorded for  the permanent record. Under direct ultrasound visualization, the external jugular vein is identified. Its image is recorded for the permanent record and a micropuncture  needle is inserted into the jugular vein, microwire followed by micro sheath is then placed. Hand injection of contrast is then utilized and demonstrates a patency of the more proximal external jugular vein into the subclavian vein and the innominate vein. However, there is then string sign with a subtotal occlusion of the superior vena cava proper. There is filling of the atrium, however. Using a Glidewire KMP catheter after upsizing to a 6 French sheath, the stricture is negotiated and ultimately the wire is advanced down into the inferior vena cava. The catheter is followed and an Amplatz superstiff wire is exchanged. The sheath is now upsized to an 8 [place] sheath and initially an 8 x 6 conquest balloon and subsequently a 12 x 6 conquest balloon are used to angioplasty the superior vena cava. There is a modest improvement in the lumen through this area now with approximately an 8 to 10 mm luminal gain clearly enough to allow passage of the catheter. Initially, a 19 cm tip to cuff Cannon catheter is selected. However, after placement of the peel-away sheath and insertion of the catheter, catheter tips are not in the proper position with the cuff at the level of the counterincision and the neck and, therefore, a 23 cm tip to cuff catheter is selected. The peel-away sheath is exchanged for the initial catheter and subsequently the catheter is advanced through the peel-away sheath over the wire and positioned with the tips in the mid to distal atrium.   Catheter is then pulled subcutaneously and transected. The hub is connected without difficulty. Both lumens aspirate and flush easily and the catheter is packed with 5,000 units of heparin per lumen.   The catheter is then secured to the chest wall with interrupted 0 silk sutures. The neck counterincision is closed with a 4-0 Monocryl subcuticular and Dermabond. Sterile dressing is applied. The patient tolerated the procedure quite well and she was taken to the  recovery area in excellent condition.   ____________________________ Katha Cabal, MD ggs:ap D: 08/22/2012 07:35:33 ET T: 08/22/2012 10:26:28 ET JOB#: [id number]  cc: Katha Cabal, MD, <Dictator> Murlean Iba, MD Donato Heinz, MD Katha Cabal MD ELECTRONICALLY SIGNED 08/26/2012 14:06

## 2015-03-18 NOTE — Op Note (Signed)
PATIENT NAME:  Lori [name], Lori [name] MR#:  [id number] DATE OF BIRTH:  [birth year]  DATE OF PROCEDURE:  03/25/2013  PREOPERATIVE DIAGNOSES:  1.  End-stage renal disease requiring hemodialysis.  2.  Complication, arteriovenous dialysis device.  3.  Superior vena cava syndrome.   POSTOPERATIVE DIAGNOSES:  1.  End-stage renal disease requiring hemodialysis.  2.  Complication, arteriovenous dialysis device.  3.  Superior vena cava syndrome.   PROCEDURE PERFORMED: Creation of a left femoral-to-femoral AV graft using bovine carotid (artegraft).   SURGEON: Katha Cabal, M.D.   ANESTHESIA: General by LMA.   FLUIDS: Per anesthesia record.   ESTIMATED BLOOD LOSS: Minimal.   SPECIMEN: None.   INDICATIONS: The patient is a 44 year old woman who is undergoing attempts at placement of an AV graft in her left thigh. The risks and benefits were reviewed. All questions were answered. The patient has agreed to proceed.   DESCRIPTION OF PROCEDURE: The patient is taken to the Operating Room and placed in the supine position. After adequate general anesthesia is induced, appropriate invasive monitors are placed. Her left groin and thigh area is prepped and draped in a sterile fashion. The symphysis pubis and anterior iliac spine is palpated and a line drawn with a surgical marker in an oblique fashion parallel to the ilioinguinal ligament. A straight incision is then created and carried down through the soft tissues, transecting the fascia and exposing the ilioinguinal ligament itself. An incision is then made again paralleling the ligament, exposing the common femoral artery as well as the common femoral vein. The vein is then skeletonized. Lots of little branches are ligated between silk ties and 3-0 Vicryl. A branch of the common femoral artery is also taken down and ligated. Vessel loops are then passed around the common femoral vein as well as the common femoral artery proximally and distally. A  counterincision is then made on the thigh. Gore tunneler is then passed subcutaneously and the artery graft is prepared on the back table. It is then marked with a surgical marker and pulled through in a U-shaped configuration. The artery is then delivered into the surgical field with Silastic vessel loops used for control. Arteriotomy is made with a #11 blade and extended with Potts scissors. Stay suture of 6-0 Prolene is placed and the graft itself is beveled to an appropriate shape. The graft is then sewn to the common femoral artery in an end graft to side artery fashion using running 6-0 Prolene. Flushing maneuvers are performed and flow is established back through the femoral as well as in the graft. The graft is then copiously irrigated and checked for a smooth contour, both at the apex as well as for proper length. With it in the pressurized state, it is then irrigated with sterile saline and clamped just above the anastomosis.   The vein is then delivered into the field, controlling it with Silastic vessel loops. A venotomy is made, extended with Potts scissors and the artegraft beveled to an appropriate shape. End graft to side vein anastomosis is fashioned with a running 6-0 Prolene. Flushing maneuvers are performed and flow is established through the graft. Excellent thrill is noted. Two  2 bleeding points are identified in the venous suture line and these are controlled with interrupted 6-0 Prolenes.   The wound is then irrigated copiously with saline and then closed in multiple layers using interrupted 3-0 Vicryl for 4 layers, followed by 4-0 Monocryl subcuticular and Dermabond. Counterincision is closed with 3-0 Vicryl interrupted,  followed by 4-0 Monocryl subcuticular and Dermabond. The patient tolerated the procedure well. There were no immediate complications. Sponge and needle counts were correct and she was taken to the recovery area in excellent condition.    ____________________________ Katha Cabal, MD ggs:jm D: 03/25/2013 17:13:28 ET T: 03/25/2013 18:42:37 ET JOB#: [id number]  cc: Katha Cabal, MD, <Dictator> Katha Cabal MD ELECTRONICALLY SIGNED 04/06/2013 13:49

## 2015-03-18 NOTE — Consult Note (Signed)
PATIENT NAME:  Lori [name], Lori [name] MR#:  [id number] DATE OF BIRTH:  [birth year]  DATE OF CONSULTATION:  07/28/2013  REFERRING PHYSICIAN:  Dr. Delana Meyer.  CONSULTING PHYSICIAN:  Lyman Sink, MD  CHIEF COMPLAINT: Bleeding of fistula, infection of fistula.   HISTORY OF PRESENT ILLNESS: This is a 44 year old female who is well known to our services. She was admitted recently with prolonged hospitalization due to exposure of her AV graft on the left lower quadrant that was secondary to a big infection on the anterior abdominal wall.   The patient presented to the ER today due to bleeding on the left groin. She had her AV femoral on the left side, loop graft, which was complicated with infection, and since she has been receiving her wound VAC changes and dressing changes on a weekly basis, it seems like the wound has been closing normally. There was a small hole that Dr. Delana Meyer described as the size of the tip of a pin which was getting granulated, but apparently last night she had a significant amount of bleeding on her wound VAC, and during the past two weeks, it has been increasing on draining.   Because of that, Dr. Delana Meyer decided to bring her to the OR and completely remove the AV graft.  The patient is now in her room. She is still under the effects of pain medications and anesthesia and not able to give me much information. The husband is there and he does not know much about her history.   REVIEW OF SYSTEMS: A 12-system review of systems unable to obtain due to the patient's somnolence.   PAST MEDICAL HISTORY:  1.  MRSA infection over 6 months on anterior wall of the abdomen.  2.  Coronary artery disease.  3.  CVA. 4.  Stroke.  5.  History of asthma.  6.  Osteoarthritis.  7.  Chronic disease anemia.  8.  GERD.  9.  Hypothyroidism and history of hypertension.  10.  End-stage renal disease with hemodialysis Tuesday, Thursday, Saturday.  11.  History of stroke, CVA.   PAST  SURGICAL HISTORY:  1.  AV graft surgery several times with multiple wound VAC changes and debridements of the left groin area.  2.  Tonsillectomy.  3.  Thyroidectomy with removal of parathyroid glands.  4.  BTL.  5.  C-section.   SOCIAL HISTORY: The patient is married, lives with her husband. She is a nonsmoker. She has no history of alcohol use. She does not use any drugs. She is on disability.   FAMILY HISTORY: Positive for father dying from kidney disease and her mother dying from lung cancer.   ALLERGIES: BEE STINGS, COUMADIN, AND ALTEPLASE.   CURRENT MEDICATIONS: Tums 1000 mg three times daily, Synthroid 344mcg once a day, Percocet 5/325 mg one to 2 tablets q.6 hours,  omeprazole 20 mg two times daily, Nephro-Vite 1 tablet once a day, metronidazole 500 mg every 8 hours, ibuprofen 600 mg four times a day, Fosrenol 1000 mg one to two 3 times daily, EpiPen once a day, albuterol ipratropium 2 puffs inhaled once a day.   PHYSICAL EXAMINATION:  VITAL SIGNS: Blood pressure 103/63, pulse 78, respirations 12, temperature 97.4, oxygen saturation 100% on 2 liters nasal cannula. GENERAL: A little bit lethargic due to pain medications. She wakes up enough to say her name and tell me that she just had surgery, but she otherwise is lethargic, not able to answer most of the questions. I was able to talk to  the husband for secondary information, but he is a very poor historian as well. In general, the patient is obtunded. She looks chronically ill.  ENT:  Pupils are equal and reactive. Extraocular movements are intact. Mucosa are moist. Anicteric sclerae. Pink conjunctivae. No oral lesions. No oropharyngeal exudates.  NECK: Supple. No JVD. No thyromegaly. No adenopathy. No carotid bruit. No rigidity.  CARDIOVASCULAR: Regular rate and rhythm. No murmurs, rubs or gallops are appreciated. No displacement of PMI. No tenderness to palpation, anterior chest wall.  LUNGS: Normal breath sounds with good air  entrance. No dullness to percussion. No wheezing. No use of accessory muscles.  ABDOMEN: Soft, nontender, nondistended. No hepatosplenomegaly. No masses. Bowel sounds are positive.  MUSCULOSKELETAL: No significant joint swelling, joint effusions.  EXTREMITIES: No edema, cyanosis or clubbing.  VASCULAR: Pulses +2. Capillary refill less than 3 distally.  PSYCHIATRY: Lethargic but when she wakes up, she is able to tell me her name and what is going on, why she is here in the hospital. She follows commands, but she is unable to answer lots of questions because she goes back to sleep. She is complaining of pain.  NEUROLOGIC: Cranial nerves II through XII intact. Strength seems to be equal in all four extremities.  LYMPHATIC: Negative for lymphadenopathy in the neck or supraclavicular areas.  SKIN: No rashes or petechiae.   LABORATORIES: Growing E. coli with ESBL sensitive to imipenem. The patient currently taking meropenem. White count 7.6. Hemoglobin is 10.2. Platelet count is 243. The glucose is 85, creatinine is 10,  potassium 5.6, sodium 135.    ASSESSMENT AND PLAN:  1.  Infection of arteriovenous fistula with bleeding. The patient is status post removal of the arteriovenous fistula.  2.  Her hemoglobin is stable, is 10.2. No signs of bleeding at this moment. She is hemodynamically stable. We follow up along with Dr. Delana Meyer for care of her chronic medical problems.  3.  End-stage renal disease. Continue hemodialysis through [facility]. due to Infected vascular graft. The patient has been seen by Dr. Clayborn Bigness in the past. At this time, Dr. Ola Spurr is going to be involved. The patient currently on meropenem. She is growing extended-spectrum beta-lactamase Escherichia coli which is sensitive to imipenem.  4.  History of hypertension. At this moment, her blood pressure is low. We are going to hold blood pressure medications.  5.  Pain management with OxyContin.  6.  History of hypoparathyroidism.  Continue calcium.  7.  Hypothyroidism. Continue Synthroid.  8.  Gastrointestinal prophylaxis with Protonix.  9.  Deep vein thrombosis prophylaxis. THE PATIENT IS ALLERGIC TO HEPARIN. We are going to monitor closely.   TIME SPENT: I spent about 45 minutes reviewing the case, talking with the patient. I spoke also with Dr. Delana Meyer. We continue to follow.   Thank you so much for this consultation.    ____________________________ Aledo Sink, MD rsg:np D: 07/28/2013 20:01:00 ET T: 07/28/2013 20:59:54 ET JOB#: [id number]  cc: [place] Sink, MD, <Dictator> Runa Whittingham America Brown MD ELECTRONICALLY SIGNED 08/07/2013 3:41

## 2015-03-18 NOTE — Op Note (Signed)
PATIENT NAME:  Lori [name], Lori [name] MR#:  [id number] DATE OF BIRTH:  [birth year]  DATE OF PROCEDURE:  07/28/2013  PREOPERATIVE DIAGNOSIS:  1.  Infected left AV thigh graft.  2.  Complication AV graft with hemorrhage.  3.  End-stage renal disease requiring hemodialysis.  4.  Superior vena cava syndrome.   POSTOPERATIVE DIAGNOSES:  1.  Infected left AV thigh graft.  2.  Complication AV graft with hemorrhage.  3.  End-stage renal disease requiring hemodialysis.  4.  Superior vena cava syndrome.   PROCEDURE PERFORMED:  Explantation of left AV thigh graft,   SURGEON: Hortencia Pilar, M.D.   ASSISTANT: Lori [name]  ANESTHESIA: General by endotracheal intubation.   FLUIDS: Per anesthesia record.   ESTIMATED BLOOD LOSS: 75 mL.   SPECIMEN: Artegraft excised, sent to pathology.   INDICATIONS: Ms. Berroa is a 44 year old woman who presented to the Emergency Room with bleeding from her graft site. Evaluation was consistent with erosion of the graft secondary to infection. Cultures have come back positive and she is therefore, been prepared for excision of her graft and repair of any vascular defect. The risks and benefits were reviewed. All questions answered. The patient agrees to proceed.   DESCRIPTION OF PROCEDURE: The patient is taken to the operating room and placed in the supine position. After adequate general anesthesia is induced and appropriate invasive monitors are placed, she is positioned supine. The left groin and thigh area are prepped and draped in a sterile fashion. Appropriate timeout is called.   A curvilinear incision is created along the superior aspect of the wound and carried down through the granulation tissue, which is approximately 1 cm thick, to expose the external, oblique and rectus fascia. This is then excised, the rectus muscle is transected. The superficial epigastric vessels are mobilized and retracted medially. The internal oblique is then opened and the  preperitoneal space is entered. Using blunt dissection with sponge sticks, the external iliac artery and vein are identified and then subsequently looped with Silastic vessel loops for vascular control.   The area where the graft is exposed in the base of the wound is then incised. Upon removing the thrombus and erosion in the exposed portion of the graft is identified. This is then clamped above and below and the Artegraft is transected. The dissection is then carried down to expose the previous suture line. It is well incorporated for a distance of approximately 2 cm down to the suture line. Because it is so well incorporated at this level and because the risk of trying to dissect out the artery proximally and distally would have a tremendously high risk for vascular injury, it is elected to clamp the AV graft at the suture line and then transect the graft and it was oversewn with 5-0 Prolene using a horizontal mattress, followed by an over and under suturing. Hemostasis was obtained and this left just graft a millimeter or 2 of graft attached to the artery. The Artegraft was then excised through several serial incisions, taking it out piece by piece. Ultimately, returning back to the venous anastomosis and the Artegraft was dissected down to the level of the anastomosis at the venous side, and again, transected in over and under suturing was performed. The wound was then irrigated with GU irrigant. The oblique incision superiorly was closed in layers using running 0 Vicryl to reapproximate the fascia of the internal oblique, followed by running 0 Vicryl to reapproximate the rectus sheath and external oblique. The granulation  tissue was then reapproximated using running 3-0 Vicryl. The femoral artery was then irrigated again and then closed. Approximately 4 layers were achieved over the artery, protecting it in 3 layers over the venous anastomosis. Silver sponge VAC wound was then placed. The counterincision  incisions were closed with staples. Gauze was placed over the counterincisions.   The patient tolerated the procedure well. Blood loss was only 75 mL. She remained stable throughout the surgery and was taken to the recovery room in good condition.     ____________________________ Katha Cabal, MD ggs:cc D: 07/29/2013 17:56:00 ET T: 07/29/2013 21:23:21 ET JOB#: [id number]  cc: Katha Cabal, MD, <Dictator> Katha Cabal MD ELECTRONICALLY SIGNED 08/07/2013 9:13

## 2015-03-18 NOTE — Consult Note (Signed)
PATIENT NAME:  Lori [name], Lori [name] MR#:  [id number] DATE OF BIRTH:  [birth year]  INFECTIOUS DISEASES CONSULTATION REPORT  DATE OF CONSULTATION:  07/29/2013  REQUESTING PHYSICIAN:  Dr. Delana Meyer.  CONSULTING PHYSICIAN:  Cheral Marker. Ola Spurr, MD  REFERRING PHYSICIAN: Infected AV graft.   HISTORY OF PRESENT ILLNESS: This is a 44 year old who has been on dialysis for 17 years, with a very complicated access history. She had placement of the left AV graft in her left femoral region that was complicated by a wound infection. This has been cared for by  Dr. Delana Meyer as an outpatient with a [place] in place. She had been healing up and was down to just the size of a pin hole, with good granulation, however over the last 1 to 2 weeks it has had increasing drainage and has had increasing blood. When the patient was noted to have a large amount of bleeding from the graft Dr. Delana Meyer took the patient to the ER and removed  About 95% of the graft, with only a small amount that was too adherent to remove. She currently has a Wound-VAC over the surgical site and cultures are growing in an extended-spectrum beta-lactamase inhibitor E. coli. We are consulted for further antibiotic selection and management.   PAST MEDICAL HISTORY: 1.  End-stage renal disease for 17 years. 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 She is on hemodialysis Tuesday, Thursday, Saturday.  2.  History of coronary artery disease.  3.  CVA.  4.  Prior history of asthma.  5.  Osteoarthritis.  6.  Anemia of chronic disease.  7.  GERD.  8.  Hypothyroidism.  9.  Hypertension.   PAST SURGICAL HISTORY:  1.  AV graft surgery several times, with Wound-VAC changes and debridements of the left groin area.  2.  Tonsillectomy.  3.  Thyroidectomy, with removal of parathyroid glands.  4.  Bilateral tubal ligation.  5.  C-section.   SOCIAL HISTORY: The patient is married. She lives with her husband. She does not smoke or drink. She does not  use drugs. She is on disability.   FAMILY HISTORY: Father died from kidney disease, and mother with lung cancer.   ALLERGIES: The patient is allergic to: BEE STINGS, COUMADIN, AND ALTEPLASE.   REVIEW OF SYSTEMS: 11 systems reviewed and negative except as per HPI.   MEDICATIONS: Current antibiotics since admission include: Meropenem, begun September 3rd. Other medications include Norco, calcium carbonate, Colace, hydromorphone, Lanthanum,  levothyroxine, multivitamin, pantoprazole, MiraLAX, Phenergan, Ambien, Procrit, heparin at dialysis.   PHYSICAL EXAMINATION: VITALS: T-max since admission is 97.3, pulse of 65, blood pressure 112/24, sat 100% on room air, respirations 19.  GENERAL: She is chronically ill-appearing. He scored a tan complexion but white skin over her eyelids and some periorbital edema. Her pupils are equal, round, reactive to light and accommodation. Extraocular movements are intact. Her sclerae are anicteric.  OROPHARYNX: Clear.  HEART: Regular, but with very distant heart sounds.  LUNGS: Clear to auscultation bilaterally. She has a hemodialysis catheter in her right upper chest which has no redness or erythema or drainage.  ABDOMEN: Soft, distended. She has on her left lower quadrant a large Wound-VAC. The area around it is somewhat indurated and tender. In her left anterior thigh she has surgical sites which have staples in place but are clean, dry and well-approximated. She has 1+ edema, bilateral lower extremities.  NEUROLOGIC: She is alert, interactive, oriented x 3. Grossly nonfocal neuro exam.   DATA: White blood count on  admission is 7.6; currently 10.6, hemoglobin 8.9, platelets 195.   Renal function is that of end-stage renal disease. There are no blood cultures pending.   Microbiologies were reviewed over the last 6 months. Her current cultures taken August 27th  are growing mixed organisms including Escherichia coli, Morganella, Enterococcus bacteroides. The E.  coli is an ESBL producer, however this shows sensitivity to Bactrim. The bacteroides is a  beta-lactamase producer as will. The enterococcus is sensitive to ampicillin. The Morganella is also sensitive to Bactrim.   Prior organisms from prior admissions June 13th: Blood culture negative. June 1st wound culture grew Klebsiella as well as coag-negative staph. That Klebsiella from that wound was also sensitive to Bactrim.   Another culture on June 1st 06/01 also grew Providencia, Klebsiella, and peptoniphilus.   Blood cultures May 31st were negative x 2.   IMPRESSION: A 44 year old female with end-stage renal disease and quite a complicated surgical history regarding her AV graft. The left AV thigh graft is infected, and most of it has been explanted. There is a Wound-VAC currently in place. Her white count is normal. She has no fevers. Cultures are mixed. The most significant organism is the ESBL-producing Escherichia coli, however fortunately this is sensitive to Bactrim. The other organisms could also be covered by Augmentin.   At this point I would suggest she will need a prolonged course of antibiotics to try to clear this infection and heal the wound. She does have some retained graft, which may be difficult to completely eradicate.   I recommend continuing her on the meropenem while she is inpatient. Once she is ready for discharge I would start her on Bactrim and Augmentin. Given her renal failure we will ask the pharmacy to renally dosed it. It would likely be 1 tablet of each every 24 hours.   I would continue local care and Wound-VAC per Dr. Delana Meyer. If she should worsen she may need IV antibiotics, it will be difficult as she would have to have a PICC line in place and her access is already so limited.   Thank you for the consult. I will be glad to follow with you.    ____________________________ Cheral Marker. Ola Spurr, MD dpf:dm D: 07/29/2013 08:57:15 ET T: 07/29/2013 09:25:07  ET JOB#: [id number]  cc: Cheral Marker. Ola Spurr, MD, <Dictator> Shaelin Lalley Ola Spurr MD ELECTRONICALLY SIGNED 08/02/2013 20:43

## 2015-03-18 NOTE — Op Note (Signed)
PATIENT NAME:  INNOCENCE, SOSNA MR#:  [id number] DATE OF BIRTH:  [birth year]  DATE OF PROCEDURE:  06/03/2013  PREOPERATIVE DIAGNOSES:  Left groin abscess with a large open wound.   POSTOPERATIVE DIAGNOSIS: Left groin abscess with a large open wound.   PROCEDURES PERFORMED: 1. Excisional debridement of subcutaneous tissues of the abdominal wall.  2. Replacement of VAC dressing.   SURGEON: Katha Cabal, M.D.   ANESTHESIA: General by LMA.   FLUIDS: Per anesthesia record.   ESTIMATED BLOOD LOSS: Minimum.   SPECIMEN: Debrided tissue was not sent to pathology.   INDICATIONS: The patient is a 44 year old woman who returns for her weekly VAC dressing change and evaluation of her wound. She has demonstrated excellent healing of the base of the wound along the fascial planes and has almost covered her AV graft completely; however, the section of abdominal wall subcutaneous adipose tissue has required serial debridements. Risks and benefits were reviewed. All questions answered. The patient has agreed to proceed.   DESCRIPTION OF PROCEDURE: The patient is taken to the Operating Room and placed in the supine position. After adequate general anesthesia is induced, appropriate invasive monitors are placed, she is positioned supine and the existing VAC dressing change from the left groin was removed without difficulty. The area was then prepped and draped in sterile fashion. Appropriate timeout is called.   Using a forceps with teeth and a 10 blade scalpel, excision of the desiccated nonhealing adipose tissue is performed. The skin edge at this time appears completely viable and there are islands of granulation tissue that are starting to form within the subcutaneous tissues. No debridement is required along the inferior margin of the groin and the area of the exposed graft continues to decrease in size as granulation tissue continues to cover this area. The wound has not measurably changed since previous  surgery last week. The debrided tissues are passed off.  They were not sent to pathology as there is nothing unusual about them. The wound is then irrigated with sterile saline. Bleeding points are controlled with Bovie cautery. Single 3-0 Vicryl suture was utilized to ligate 1 vein in the abdominal wall and subsequently a VAC dressing and Ace wrap were placed with a good seal. There are no immediate complications.    ____________________________ Katha Cabal, MD ggs:rw D: 06/03/2013 13:32:29 ET T: 06/03/2013 14:50:54 ET JOB#: [id number]  cc: Katha Cabal, MD, <Dictator> Katha Cabal MD ELECTRONICALLY SIGNED 06/13/2013 11:12

## 2015-03-18 NOTE — Consult Note (Signed)
Present Illness The patient is a 44 year old female with history of end-stage renal disease on hemodialysis.  She had left thigh AV graft done on April 30th, a month ago, and since that time patient tells me that she has constant pain in that area associated with gradual increase in swelling and redness and oozing of fluid.  She was placed on oral antibiotic, however her symptoms worsened.  She is scheduled to have CAT scan of the abdomen on this coming Monday, however due to worsening symptoms and pain she decided to come to the Emergency Department.  She is found to have low grade temp with increased WBC   Home Medications: Medication Instructions Status  Synthroid 300 mcg (0.3 mg) oral tablet 1 tab(s) orally once a day (at bedtime) Active  Fosrenol 1000 mg oral tablet, chewable 1 - 2 tab(s) chewed 3 times a day with meals Active  Nephro-Vite 1 tab(s) orally once a day (in the morning) Active  albuterol-ipratropium CFC free 100 mcg-20 mcg/inh inhalation aerosol 2 puff(s) inhaled once a day and prn every 6 hours Active  Epi EZ Pen  injectable  Active  omeprazole 20 mg oral delayed release capsule 1 cap(s) orally 2 times a day Active  Tums Ultra 1000 mg oral tablet, chewable 1 tab(s) orally 3 times a day with each meal and with snacks Active  Vitamin D3 50000 international unit(s) orally every 7 days on Sundays. Active  Percocet 5/325 325 mg-5 mg oral tablet  orally 1-2 every 6 hours Active    Bee Stings: Anaphylaxis  Alteplase: N/V/Diarrhea, Dizzy/Fainting  Coumadin: Other  Case History:  Family History Non-Contributory   Social History negative tobacco, negative ETOH, negative Illicit drugs   Review of Systems:  Fever/Chills Yes   Cough No   Sputum No   Abdominal Pain No  left groin pain   Diarrhea No   Constipation No   Nausea/Vomiting No   SOB/DOE No   Chest Pain No   Telemetry Reviewed NSR   Dysuria No   Physical Exam:  GEN well developed, well nourished    HEENT hearing intact to voice, dry oral mucosa, good dentition   NECK supple  trachea midline   RESP normal resp effort  no use of accessory muscles   CARD regular rate  no JVD   VASCULAR ACCESS AV fistula present  Good bruit  Good thrill  groin incision with drainage and tissue necrosis   ABD left groin with tenderness   EXTR negative cyanosis/clubbing, positive edema   SKIN positive ulcers, skin turgor good   NEURO cranial nerves intact, follows commands, motor/sensory function intact   PSYCH alert, A+O to time, place, person   Nursing/Ancillary Notes: **Vital Signs.:   01-Jun-14 08:50  Vital Signs Type Routine  Temperature Temperature (F) 97.5  Celsius 36.3  Temperature Source oral  Pulse Pulse 64  Pulse source if not from Vital Sign Device per cardiac monitor  Respirations Respirations 28  Systolic BP Systolic BP 96  Diastolic BP (mmHg) Diastolic BP (mmHg) 46  Mean BP 62  BP Source  if not from Vital Sign Device non-invasive  Pulse Ox % Pulse Ox % 96  Pulse Ox Activity Level  At rest  Oxygen Delivery Room Air/ 21 %  Pulse Ox Heart Rate 66   Hepatic:  31-May-14 18:48   Bilirubin, Total 0.4  Alkaline Phosphatase 111  SGPT (ALT) 12  SGOT (AST) 15  Total Protein, Serum 7.7  Albumin, Serum  3.1  Routine  BB:  31-May-14 18:48   ABO Group + Rh Type A Positive  Antibody Screen NEGATIVE (Result(s) reported on 25 Apr 2013 at 09:27PM.)    20:20   ABO Group + Rh Type A Positive  Antibody Screen NEGATIVE (Result(s) reported on 25 Apr 2013 at 09:28PM.)  Routine Micro:  31-May-14 18:48   Micro Text Report BLOOD CULTURE   COMMENT                   NO GROWTH IN 8-12 HOURS   ANTIBIOTIC                       Micro Text Report BLOOD CULTURE   COMMENT                   NO GROWTH IN 8-12 HOURS   ANTIBIOTIC                       Culture Comment NO GROWTH IN 8-12 HOURS  Result(s) reported on 26 Apr 2013 at 08:11AM.  Culture Comment NO GROWTH IN 8-12 HOURS  Result(s)  reported on 26 Apr 2013 at 08:11AM.  Routine Chem:  31-May-14 18:48   Glucose, Serum  114  BUN 16  Creatinine (comp)  5.33  Sodium, Serum  135  Potassium, Serum  3.2  Chloride, Serum  95  CO2, Serum 30  Calcium (Total), Serum 9.2  Osmolality (calc) 272  eGFR (African American)  11  eGFR (Non-African American)  9 (eGFR values <61m/min/1.73 m2 may be an indication of chronic kidney disease (CKD). Calculated eGFR is useful in patients with stable renal function. The eGFR calculation will not be reliable in acutely ill patients when serum creatinine is changing rapidly. It is not useful in  patients on dialysis. The eGFR calculation may not be applicable to patients at the low and high extremes of body sizes, pregnant women, and vegetarians.)  Anion Gap 10  Routine Coag:  31-May-14 18:48   Activated PTT (APTT)  44.5 (A HCT value >55% may artifactually increase the APTT. In one study, the increase was an average of 19%. Reference: "Effect on Routine and Special Coagulation Testing Values of Citrate Anticoagulant Adjustment in Patients with High HCT Values." American Journal of Clinical Pathology 2006;126:400-405.)  Prothrombin  14.8  INR 1.1 (INR reference interval applies to patients on anticoagulant therapy. A single INR therapeutic range for coumarins is not optimal for all indications; however, the suggested range for most indications is 2.0 - 3.0. Exceptions to the INR Reference Range may include: Prosthetic heart valves, acute myocardial infarction, prevention of myocardial infarction, and combinations of aspirin and anticoagulant. The need for a higher or lower target INR must be assessed individually. Reference: The Pharmacology and Management of the Vitamin K  antagonists: the seventh ACCP Conference on Antithrombotic and Thrombolytic Therapy. CBTDVV.6160Sept:126 (3suppl): [id number] A HCT value >55% may artifactually increase the PT.  In one study,  the increase was an  average of 25%. Reference:  "Effect on Routine and Special Coagulation Testing Values of Citrate Anticoagulant Adjustment in Patients with High HCT Values." American Journal of Clinical Pathology 2006;126:400-405.)  Routine Hem:  31-May-14 18:48   WBC (CBC)  20.2  RBC (CBC) 4.02  Hemoglobin (CBC)  11.7  Hematocrit (CBC) 35.9  Platelet Count (CBC)  479  MCV 89  MCH 29.0  MCHC 32.6  RDW  15.1  Neutrophil % 82.8  Lymphocyte % 7.1  Monocyte %  8.0  Eosinophil % 1.2  Basophil % 0.9  Neutrophil #  16.7  Lymphocyte # 1.4  Monocyte #  1.6  Eosinophil # 0.2  Basophil #  0.2 (Result(s) reported on 25 Apr 2013 at 07:16PM.)   CT:    31-May-14 19:19, CT Abdomen and Pelvis With Contrast  CT Abdomen and Pelvis With Contrast   REASON FOR EXAM:    (1) abd wall cellulitis, pain; (2) abd wall   cellulitis, pain  COMMENTS:       PROCEDURE: CT  - CT ABDOMEN / PELVIS  W  - Apr 25 2013  7:19PM     RESULT: CT of the abdomen and pelvis is performed with 100 mL of   Isovue-300 iodinated intravenous contrast without oral contrast. Images   are reconstructed at 3.0 mm slice thickness in the axial plane. The   patient has no previous similar exam for comparison.    There is significant amount of subcutaneous emphysema in the soft tissues   of the anterior left pelvis with some adjacent inflammatory stranding.   There appears to be an extraperitoneal fluid collection with a Hounsfield   reading of 8.0 cm measuring approximately 4.26 cm x 4.74 cm on image 121.     This is anterior to the area the acetabulum and superficial to the   musculature in the region. There is a surgical drain demonstrated   inferior to this region adjacent to the vessels. There is an anastomosis   from the vascular grafts superior to the surgical drain. The venous limb   of this and arterial limb of this abuts the abscess or presumed abscess.   Both vessels appear to be patent with contrast present. No   intraperitoneal  fluid collection or free fluid is seen. The kidneys show   chronic renal failure with severe cortical thinning in the multitude of   cysts. No radiopaque gallstones are evident. There is hyper enhancing   area in the liver anteriorly which may represent a hemangioma measuring   up to 2.9 x 1.9 cm. Is present on image 27 adjacent to the falciform   ligament. The pancreas is unremarkable. Atherosclerotic calcification is   seen within the aorta and its branches. There is no aneurysm. There are   multitude of enhancing vessels superficially over the abdomen and pelvis.   The lung bases show grossly normal aeration.  IMPRESSION:   1. Findings consistent with previous arteriovenous anastomosis in the   left groin with a surgical drain present and evidence of an abscess and   significant subcutaneous emphysema adjacent to thearea. Vascular   surgical followup is recommended. No intraperitoneal abscess evident. The   graft appears to be patent. There is extensive atherosclerotic   calcification. There is a hyper enhancing area in the right lobe of the   liver adjacent to the falciform ligament which could represent   hemangioma. In appearance the kidneys with atrophy and multiple cysts   consistent with chronic renal failure.    Dictation Site: 6        Verified By: Sundra Aland, M.D., MD    Impression 1.  Cellulitis and abscess formation at the left lower abdomen and groin area.             will plan I&D of groin and possible muscle flap coverage of graft VAC wound dressing 2.  End stage renal disease on hemodialysis.             nephrology  consult  3.   Asthma without exacerbation.             continue home medications 4.  Hypotension             judicious fluids   Electronic Signatures: Hortencia Pilar (MD)  (Signed 01-Jun-14 13:54)  Authored: General Aspect/Present Illness, Home Medications, Allergies, History and Physical Exam, Vital Signs, Labs, Radiology,  Impression/Plan   Last Updated: 01-Jun-14 13:54 by Hortencia Pilar (MD)

## 2015-03-18 NOTE — Op Note (Signed)
PATIENT NAME:  Lori [name], Lori [name] MR#:  [id number] DATE OF BIRTH:  1971/06/20  DATE OF PROCEDURE:  05/20/2013  PREOPERATIVE DIAGNOSES:  Left groin abscess.   POSTOPERATIVE DIAGNOSIS:  Left groin abscess.   PROCEDURES PERFORMED:   1.  Excisional debridement of left groin abscess.  2.  Replacement of VAC dressing.   SURGEON:  Katha Cabal, MD  ANESTHESIA:  General by LMA.   FLUIDS:  Per anesthesia record.   ESTIMATED BLOOD LOSS:  Minimal.   SPECIMEN:  Debrided fat and skin was not sent to pathology.   INDICATIONS:  Ms. Akopyan is being returned to the operating room for weekly VAC dressing change. She does not tolerate it at the bedside and she has needed continued debridements. The risks and benefits were reviewed and the patient agrees to proceed.   DESCRIPTION OF PROCEDURE:  The patient is taken to the operating room and placed in the supine position. After adequate general anesthesia is induced, VAC dressing is removed. The wound is prepped with Betadine. Subsequently, another 0.5 cm thickness of skin and abdominal fat is excised using a 10 blade scalpel. Necrotic tissue was passed off the field. The inferior aspect of the wound including the area of the abdominal wall is granulating well and appears to be healing nicely. The AV graft is actually being covered over quite well.   Hemostasis is obtained with a figure-of-eight 3-0 Vicryl and Bovie cautery. Wound is then irrigated and a VAC dressing is replaced.   The patient tolerated the procedure well and there were no immediate complications.    ____________________________ Katha Cabal, MD ggs:si D: 05/20/2013 17:05:00 ET T: 05/20/2013 22:13:11 ET JOB#: [id number]  cc: Katha Cabal, MD, <Dictator> Katha Cabal MD ELECTRONICALLY SIGNED 06/02/2013 14:29

## 2015-03-18 NOTE — Op Note (Signed)
PATIENT NAME:  Lori [name], MOFFORD MR#:  [id number] DATE OF BIRTH:  Aug 17, 1971  DATE OF PROCEDURE:  07/15/2013  PREOPERATIVE DIAGNOSIS: Left groin abscess secondary to postoperative wound infection.   POSTOPERATIVE DIAGNOSIS: Left groin abscess secondary to postoperative wound infection.   PROCEDURE PERFORMED: 1.  Debridement of left groin wound.  2.  Replacement of VAC dressing.   SURGEON: Hortencia Pilar, M.D.   ANESTHESIA: General by LMA.   FLUIDS: Per anesthesia record.   SPECIMEN: None. Debrided tissue was not sent to pathology.   INDICATIONS: [name] is a 44 year old woman who developed postoperative wound infection after placement of a left AV graft. The graft material used was Artegraft and in an attempt to salvage the graft, she is undergoing weekly VAC dressing changes. The risks and benefits were reviewed. All questions answered. The patient has agreed to proceed.   DESCRIPTION OF PROCEDURE: The patient is taken to the operating room and placed in the supine position. After adequate general anesthesia is induced, her left groin VAC dressing is removed and the groin and wound are prepped and draped in a sterile fashion. Using the straight Mayo scissors with forceps with teeth, some necrotic fat is debrided from the medial aspect of the wound. The wound itself measures approximately 7 x 4 x 3. The wound is then irrigated copiously and the VAC sponge is replaced. The patient tolerated the procedure well. There were no immediate complications.    ____________________________ Katha Cabal, MD ggs:dp D: 07/15/2013 11:44:49 ET T: 07/15/2013 12:43:14 ET JOB#: [id number]  cc: Katha Cabal, MD, <Dictator> Katha Cabal MD ELECTRONICALLY SIGNED 08/07/2013 9:10

## 2015-03-18 NOTE — Discharge Summary (Signed)
PATIENT NAME:  Lori [name], Lori [name] MR#:  [id number] DATE OF BIRTH:  Aug 12, 1971  DATE OF ADMISSION:  07/28/2013 DATE OF DISCHARGE:  08/03/2013  DIAGNOSES: 1. Infected arteriovenous graft, left thigh.   2. End-stage renal disease, requiring hemodialysis.  3. Superior vena cava syndrome.   HISTORY: Ms. Ozanich is a 44 year old woman who presented to the Emergency Room with excessive bleeding from her left groin, with the most likely source being her graft. She has been treated with VAC dressing changes for a left groin abscess which was a postoperative complication. Initially, it appeared she had been healing with VAC therapy, but over the last week, she had noted increasing drainage in her VAC canister, and now is presenting with bleeding.   HOSPITAL COURSE: On the day of admission, she was brought to the operating room, where she underwent excision of her graft. Once removed, the artery and vein were covered in several layers with tissue, reapproximated with Vicryls, and subsequently a VAC dressing was reapplied. Over the next 6 days, the patient improved steadily. She tolerated 2 VAC dressing changes with minimal pain medication and was seen by infectious disease, who managed her antibiotics. She was also seen by renal, and her dialysis was maintained via her right IJ catheter. Medical management was per [facility]. Her course was uneventful, and on postoperative day #6, she is discharged to home. She is to follow up with me in several weeks. She is to follow up with her nephrologist in 7 to 10 days. She will continue home health care with biweekly VAC dressing changes. Diet is unchanged. Medications are unchanged with the exception of addition of Bactrim and ampicillin per infectious disease.   ____________________________ Katha Cabal, MD ggs:OSi D: 08/07/2013 09:06:35 ET T: 08/07/2013 09:16:35 ET JOB#: [id number]  cc: Katha Cabal, MD, <Dictator> Katha Cabal MD ELECTRONICALLY SIGNED  08/27/2013 8:38

## 2015-03-18 NOTE — Op Note (Signed)
PATIENT NAME:  Lori [name], Lori [name] MR#:  [id number] DATE OF BIRTH:  11/30/1970  DATE OF PROCEDURE:  06/17/2013  PREOPERATIVE DIAGNOSIS:  Left groin abscess.   POSTOPERATIVE DIAGNOSIS:  Left groin abscess.  PROCEDURE PERFORMED: Change of wound VAC dressing under general anesthesia.   SURGEON: Katha Cabal, M.D.   DESCRIPTION OF PROCEDURE: The patient is taken to the Operating Room and placed in the supine position. After adequate general anesthesia is induced, the existing VAC is removed. Wound is improved compared to the previous week. There are several areas of fat that still appear slightly desiccated, but overall there is increasing granulation of the abdominal wall. The floor of the wound, which represents the abdominal fascia and the area of the exposed graft are continuing to granulate nicely. Area of exposed graft is now approximately 7 x 4 mm. Mepitel is then placed in the base of the wound followed by a black wound VAC sponge and then the sponge is covered and the VAC is connected, excellent seal is noted.   The patient tolerated the procedure well and she will continue her dressing changes as arranged until the graft itself is completely covered and then the feasibility of an outpatient VAC change will be investigated.   ____________________________ Katha Cabal, MD ggs:cs D: 06/24/2013 14:49:00 ET T: 06/24/2013 15:12:03 ET JOB#: [id number]  cc: Katha Cabal, MD, <Dictator> Katha Cabal MD ELECTRONICALLY SIGNED 07/07/2013 8:47

## 2015-03-18 NOTE — Op Note (Signed)
PATIENT NAME:  Lori [name], Lori [name] MR#:  [id number] DATE OF BIRTH:  Sep 06, 1971  DATE OF PROCEDURE:  07/30/2013  PREOPERATIVE DIAGNOSES:  1.  Ischemia of the left lower extremity.  2.  Atherosclerotic occlusive disease, left lower extremity with rest pain.  3.  Embolization, left lower extremity.   POSTOPERATIVE DIAGNOSES: 1.  Ischemia of the left lower extremity.  2.  Atherosclerotic occlusive disease, left lower extremity with rest pain.  3.  Embolization, left lower extremity.   PROCEDURES PERFORMED:  1.  Abdominal aortogram.  2.  Left lower extremity distal runoff, third order catheter placement.  3.  Additional third order catheter placement.  4.  Mechanical thrombectomy using the AngioJet, left profunda femoris artery.   SURGEON: Hortencia Pilar, M.D.   SEDATION: Precedex drip with 200 mcg of fentanyl IV. Continuous ECG, pulse oximetry and cardiopulmonary monitoring was performed throughout the entire procedure by the interventional radiology nurse. Total sedation time was 1 hour, 30 minutes.   ACCESS: A 7-French sheath, right common femoral artery.   CONTRAST USED: Isovue 75 mL.   FLUOROSCOPY TIME: 18.4 minutes.   INDICATIONS: Ms. Lewing is a very unfortunate 44 year old woman with multiple very extensive medical problems, who recently underwent excision of a left femoral loop graft secondary to infection. Postoperative day #1, she was noted to have tingling in her left foot and coolness of the foot. As a baseline, she has known atherosclerotic occlusive disease and does not have palpable pulses on either side. However, given the differential between the left and right, I was suspicious that something had progressed and she is therefore undergoing angiography with the hope for intervention and limb salvage. The risks and benefits were reviewed. All questions were answered. The patient agrees to proceed.   DESCRIPTION OF PROCEDURE: The patient was taken to the special procedures and  placed in the supine position. After adequate sedation is achieved, both groins are prepped and draped in a sterile fashion. Ultrasound is placed in a sterile sleeve. Common femoral artery is identified under ultrasound. It is echolucent and pulsatile indicating patency. Image is recorded for the permanent record and under direct ultrasound visualization, a microneedle was introduced after 1% lidocaine has been infiltrated into the surrounding soft tissues. A stiffened micropuncture kit is then utilized followed by an advantage wire. A 6-French sheath is inserted and an arterial pressure transducer is then connected as non-invasive blood pressure cuffs were not obtainable. Once adequate pressure of measurements had been verified, abdominal aortogram was performed. Pigtail catheter and an advantage wire then used to cross the bifurcation and left lower extremity distal runoff was obtained. A flush occlusion of the SFA is noted with evidence of thrombus within the profunda. The catheter is then negotiated into the profunda, and third order catheter placement is performed and selective imaging of the profunda. AngioJet is then advanced after a 7-French Ansel sheath has been advanced up and over the bifurcation over the advantage wire. Multiple passes were made, a total of 90 mL, and follow-up angiography demonstrated the profunda femoris now appears free of thrombus including the small side branches. There is now complete restoration of flow.   The catheter and wire combination is then negotiated into the SFA. Multiple attempts with different catheters of CXI,  KMP, straight catheter a different wires were obtained. Ultimately, a side branch of the SFA was cannulated representing third order catheter placement. However, crossing of the entire occluded lesion could not be performed. This substantiates that this is a chronic occlusion.  Sheath is then pulled into the left groin, oblique view obtained and a StarClose  device deployed successfully. There are no immediate complications.   INTERPRETATION: Abdominal aorta and the iliac system are widely patent. The left common femoral is widely patent. In the profunda femoris, there are several areas of obvious thrombus/embolism. These are treated completely with AngioJet and mechanical thrombectomy. Superficial femoral artery appears chronically occluded as described above. There is reconstitution of the above-knee popliteal and appears to be 2 vessel runoff to the foot via the peroneal and the anterior tibial; however, anterior tibial appears diseased and does not appear intact. Peroneal does appear intact.   SUMMARY: Successful thrombectomy of the profunda femoris restoring the patient to her baseline condition.   ____________________________ Katha Cabal, MD ggs:aw D: 07/30/2013 09:42:51 ET T: 07/30/2013 09:59:22 ET JOB#: [id number]  cc: Katha Cabal, MD, <Dictator> Katha Cabal MD ELECTRONICALLY SIGNED 08/07/2013 9:13

## 2015-03-18 NOTE — Op Note (Signed)
PATIENT NAME:  Lori [name], Lori [name] MR#:  [id number] DATE OF BIRTH:  Dec 05, 1970  DATE OF PROCEDURE:  07/08/2013  PREOPERATIVE DIAGNOSIS: Left groin abscess. Now with open wound.   POSTOPERATIVE DIAGNOSIS: Left groin abscess. Now with open wound.   PROCEDURE PERFORMED: Vacuum-assisted closure (VAC) wound change under anesthesia secondary to pain intolerance.   SURGEON: Katha Cabal, M.D.   ANESTHESIA: MAC.   ESTIMATED BLOOD LOSS: Zero.   SPECIMEN: None.   INDICATIONS: Ms. Patenaude is a 44 year old woman with a rather large wound noted on her left groin secondary to an abscess. She is undergoing dressing changes under anesthesia as she has a very low pain tolerance and has not been able to achieve adequate care without anesthesia to this point. We are now transitioning from a general LMA to MAC anesthesia in hopes that we will soon be able to continue dressing changes at home.   DESCRIPTION OF PROCEDURE: The patient is brought to the operating room and placed in the supine position. After adequate MAC anesthesia is induced, the existing VAC wound dressing is removed. The wound, itself, looks quite good and continues to granulate well. Unfortunately, the area of coverage extending over the graft that was noted last week is no longer present. This is the only area that appears slightly worse. Also of note, the patient now has a rather severe yeast infection of the pannus and the groin area that is extending from the edge of the wound on the left to the right groin and thigh area. This will make getting and maintaining a seal with the VAC somewhat more difficult as I suspect she will leak slightly from the groin area or the midline area secondary to her yeast.   A small piece of Mepitel is then placed over the exposed AV graft, and a black sponge was replaced into the wound and covered. Seal was noted to be in the grain with no leak. The patient was also given a single dose IV of Diflucan in hopes of  treating the yeast and will be started on Mycolog cream to treat the majority of the area that is not covered.   ____________________________ Katha Cabal, MD ggs:gb D: 07/08/2013 17:52:57 ET T: 07/09/2013 02:57:20 ET JOB#: [id number]  cc: Katha Cabal, MD, <Dictator> Katha Cabal MD ELECTRONICALLY SIGNED 07/15/2013 7:28

## 2015-03-18 NOTE — Op Note (Signed)
PATIENT NAME:  Lori [name], Lori [name] MR#:  [id number] DATE OF BIRTH:  10-Dec-1970  DATE OF PROCEDURE:  05/08/2013  PREOPERATIVE DIAGNOSIS: Left groin abscess.  POSTOPERATIVE DIAGNOSIS: Left groin abscess.   PROCEDURES PERFORMED:  1. Excisional debridement, left groin abscess.  2. Placement of wound vacuum-assisted closure dressing change.   PROCEDURE PERFORMED BY: Katha Cabal, MD  ANESTHESIA: General by LMA.   FLUIDS: Per anesthesia record.   ESTIMATED BLOOD LOSS: Minimal.   SPECIMEN: Debrided tissue is not sent to pathology.   DESCRIPTION OF PROCEDURE: The patient is returned to the operating room for further wound care. She is undergoing general anesthesia secondary to intolerance to dressing changes.   The existing VAC dressing is removed. The wound is then prepped with Betadine. Using a #10 blade scalpel, desiccated fat is removed from the abdominal wall. Some skin is also taken with the specimen. Bleeding is then controlled with Bovie cautery. Tissues that are excised are skin and subcutaneous fat tissues. Fascia appears to be granulating well as is the area surrounding the AV graft.   VAC is then replaced without difficulty with a good seal.    ____________________________ Katha Cabal, MD ggs:OSi D: 05/08/2013 16:57:27 ET T: 05/09/2013 05:47:42 ET JOB#: [id number]  cc: Katha Cabal, MD, <Dictator> Katha Cabal MD ELECTRONICALLY SIGNED 05/13/2013 16:15

## 2015-03-18 NOTE — H&P (Signed)
Subjective/Chief Complaint bleeding from left groin   History of Present Illness the patient is a 44 year old woman well knwn to my service who presents to the ER with bleeding from the left groin.  She is s/p left AV femoral av loop graft which was complicated by a wound infection.  She has been receiving wound VAC dressing changes on a weekly basis.  Although her wound had been progressing well over the past two weeks she had noted increasing drainage.  There is a small area of the graft that had not yet completely granulated.  She denies fever or chills.  No systemic symptoms.   Past History Dialysis Tues. Thurs. Sat.:   MRSA greater than 6 months ago:   Coronary Artery Disease:   Migraines:   CVA/Stroke:   Osteoarthritis:   Asthma:   Anemia:   gerd:   thyroid and para thyroidectomy:   hypertension:   Dialysis tues thurs sat:   kidney failure:   Right Chest Catheter for Dialyasis:   Tubal Ligation:   Tonsillectomy:   Past Med/Surgical Hx:  Multi-drug Resistant Organism (MDRO): Positive culture for ESBL organsim.  Dialysis Tues. Thurs. Sat.:   MRSA greater than 6 months ago:   Coronary Artery Disease:   Migraines:   CVA/Stroke:   Osteoarthritis:   Asthma:   Anemia:   gerd:   thyroid and para thyroidectomy:   hypertension:   Dialysis tues thurs sat:   kidney failure:   Right Chest Catheter for Dialyasis:   Tubal Ligation:   Tonsillectomy:   ALLERGIES:  Bee Stings: Anaphylaxis  Alteplase: N/V/Diarrhea, Dizzy/Fainting  Coumadin: Other  HOME MEDICATIONS: Medication Instructions Status  metroNIDAZOLE 500 mg oral tablet 1 tab(s) orally every 8 hours x 45 days Active  Synthroid 300 mcg (0.3 mg) oral tablet 1 tab(s) orally once a day (at bedtime) Active  Fosrenol 1000 mg oral tablet, chewable 1 - 2 tab(s) chewed 3 times a day with meals Active  Nephro-Vite 1 tab(s) orally once a day (in the morning) Active  albuterol-ipratropium CFC free 100 mcg-20 mcg/inh inhalation  aerosol 2 puff(s) inhaled once a day and prn every 6 hours Active  Epi EZ Pen  injectable  Active  omeprazole 20 mg oral delayed release capsule 1 cap(s) orally 2 times a day Active  Tums Ultra 1000 mg oral tablet, chewable 1 tab(s) orally 3 times a day with each meal and with snacks Active  Percocet 5/325 1-2 tablets  orally every 6 hours as needed for pain Active  ibuprofen 600 mg oral tablet 1 tab(s) orally 4 times a day, As Needed - for Pain Active   Family and Social History:  Family History Non-Contributory   Social History negative tobacco, negative ETOH, negative Illicit drugs   Review of Systems:  Subjective/Chief Complaint bleeding from left groin   Fever/Chills No   Cough No   Sputum No   Abdominal Pain No   Diarrhea No   Constipation No   Nausea/Vomiting No   SOB/DOE No   Chest Pain No   Telemetry Reviewed NSR   Physical Exam:  GEN well developed, well nourished, disheveled   HEENT hearing intact to voice, dry oral mucosa   NECK supple  trachea midline   RESP normal resp effort  no use of accessory muscles   CARD regular rate  no JVD   VASCULAR ACCESS AV fistula present  Dialysis catheter present  Good bruit  Good thrill  wound dressed and not bleeding at  this tiem   ABD denies tenderness  soft  nondistended   EXTR negative cyanosis/clubbing, negative edema, large wound left groin   SKIN positive rashes, positive ulcers, skin turgor poor   NEURO cranial nerves intact, follows commands, motor/sensory function intact   PSYCH alert, A+O to time, place, person   Lab Results: Routine Hem:  02-Sep-14 01:01   WBC (CBC) 7.6  RBC (CBC)  3.51  Hemoglobin (CBC)  10.2  Hematocrit (CBC)  31.1  Platelet Count (CBC) 243  MCV 89  MCH 28.9  MCHC 32.7  RDW  17.4  Neutrophil % 62.9  Lymphocyte % 16.4  Monocyte % 11.8  Eosinophil % 5.8  Basophil % 3.1  Neutrophil # 4.8  Lymphocyte # 1.2  Monocyte # 0.9  Eosinophil # 0.4  Basophil #  0.2  (Result(s) reported on 28 Jul 2013 at 01:27AM.)   Radiology Results: LabUnknown:    27-Aug-14 07:30, Wound Aerobic/Anaerobic Culture  ESBL    27-Aug-14 07:59, Body Fluid Culture w/Smear  ESBL    Assessment/Admission Diagnosis 1.  Probable disruption of AV graft          the patient has been typed and crossed for 4 units          the dressing will be removed in the OR          The wound will be explored and likely the graft will be excised. 2.  End stage renal disease          Nephrology will be consulted. 3.  Hypertension           continue home meds   Electronic Signatures: Hortencia Pilar (MD)  (Signed 02-Sep-14 11:59)  Authored: CHIEF COMPLAINT and HISTORY, PAST MEDICAL/SURGIAL HISTORY, ALLERGIES, HOME MEDICATIONS, FAMILY AND SOCIAL HISTORY, REVIEW OF SYSTEMS, PHYSICAL EXAM, LABS, Radiology, ASSESSMENT AND PLAN   Last Updated: 02-Sep-14 11:59 by Hortencia Pilar (MD)

## 2015-03-18 NOTE — Op Note (Signed)
PATIENT NAME:  [name], [name]:  [id number] DATE OF BIRTH:  12/02/1970  DATE OF PROCEDURE:  07/22/2013  PREOPERATIVE DIAGNOSES 1.  Left groin abscess.  2.  Postoperative wound infection.  3.  Complication of arteriovenous dialysis device.  4.  End-stage renal disease, requiring hemodialysis.  5.  Superior vena cava syndrome.   POSTOPERATIVE DIAGNOSES 1.  Left groin abscess.  2.  Postoperative wound infection.  3.  Complication of arteriovenous dialysis device.  4.  End-stage renal disease, requiring hemodialysis.  5.  Superior vena cava syndrome.   PROCEDURE PERFORMED:  Debridement of left groin wound with placement of VAC wound dressing.   PROCEDURE PERFORMED BY: Katha Cabal, M.D.   ANESTHESIA: General by LMA.   FLUIDS: Per anesthesia record.   ESTIMATED BLOOD LOSS: Minimal.   SPECIMEN: None.   INDICATIONS: [name] is a 44 year old woman who presents for continued care of her left groin. She is intolerant of dressing changes without anesthesia and she has been returning on a weekly basis for her dressing changes. The risks and benefits were reviewed and the patient has agreed to continue and proceed.   DESCRIPTION OF PROCEDURE: The patient is taken to the Operating Room and placed in the supine position. After adequate general anesthesia is induced, the existing VAC wound dressing is removed and the area is prepped and draped in a sterile fashion using Betadine. There is necrotic fat noted with tunneling subcutaneously in the medial aspect. This area is debrided, excisional debridement is performed using Metzenbaum scissors as well as a curette. The subcutaneous fatty tissues are debrided. Once the wound has been cleaned, it is irrigated. There also appears to be an area in the inferior margin where there does seem to be lymph leak and also  the small area of graft continues to be exposed. This is unchanged compared to last week.   Eva-Seal 2 mL total is then prepared and  then used to fill the inferior margin of the wound where the lymph leak was identified as well as the space overlying the graft. Mepitel is then placed along the inferior margin of the wound over this area.  A separate small piece of sponge is then packed into the area of debridement medially and a larger piece is then added on top. VAC dressing is then placed, seal is excellent. The patient tolerated the procedure well and there were no immediate complications. The wound today is slightly larger and measures 13 x 5 x 3.5 cm.     ____________________________ Katha Cabal, MD ggs:cs D: 07/22/2013 17:45:00 ET T: 07/22/2013 19:51:28 ET JOB#: [id number]  cc: Katha Cabal, MD, <Dictator> Katha Cabal MD ELECTRONICALLY SIGNED 08/07/2013 9:11

## 2015-03-18 NOTE — Op Note (Signed)
PATIENT NAME:  Lori [name], Lori [name] MR#:  [id number] DATE OF BIRTH:  [birth year]  DATE OF PROCEDURE:  06/10/2013  PREOPERATIVE DIAGNOSES:  1. Left groin abscess.   2. Complication of vascular device with nonfunctional peripherally inserted central catheter.   POSTOPERATIVE DIAGNOSES:  1. Left groin abscess.   2. Complication of vascular device with nonfunctional peripherally inserted central catheter.   PROCEDURES PERFORMED:  1. Exchange of vacuum-assisted closure wound dressing, left groin.  2. Exchange of double-lumen peripherally inserted central catheter, left arm.   SURGEON: Katha Cabal, M.D.   ANESTHESIA: General by LMA.   FLUIDS: Per anesthesia record.   ESTIMATED BLOOD LOSS: Minimal.   SPECIMEN: None.   INDICATIONS: [name] is a 44 year old woman who is presenting for followup wound evaluation and dressing change. She does not tolerate procedures very well and requires general anesthesia. At the same time, her PICC line has been nonfunctioning and she is now undergoing exchange of her PICC line while under anesthesia. Risks and benefits were reviewed with the patient. All questions answered. The patient agrees to proceed.   DESCRIPTION OF PROCEDURE: The patient is taken to the operating room and placed in the supine position. After adequate general anesthesia is induced, appropriate invasive monitors are placed. The left groin VAC existing dressing is removed. The area is then prepped with Betadine. The wound is then draped in a sterile fashion. The wound is inspected. No further debridement is required at this time. There is still a small area of graft exposed which appears to be approximately the same as last week in size and diameter. Otherwise, the wound is continuing to granulate well and looks fairly healthy.   After irrigation of the wound, Mepitel is placed in the bed of the wound, a sponge is applied and covered with the dressing. VAC is then hooked up again and an  excellent seal is noted.   The left arm is then extended, prepped and draped in a sterile fashion. Wire is reintroduced into the PICC line, and the existing PICC line is removed. It measures initially to be 34 cm. The new one is trimmed by 1 cm but will not advance over the wire and, therefore, it is trimmed to 30 cm and the wire and then a peel-away sheath are removed. Both lumens aspirate blood and are flushed and packed with heparinized saline. It is secured to the skin with 0 silk, and a sterile dressing is applied. The patient tolerated the procedure well, and there were no immediate complications.    ____________________________ Katha Cabal, MD ggs:gb D: 06/10/2013 18:04:58 ET T: 06/10/2013 20:37:11 ET JOB#: [id number]  cc: Katha Cabal, MD, <Dictator> Katha Cabal MD ELECTRONICALLY SIGNED 06/13/2013 11:14

## 2015-03-18 NOTE — Op Note (Signed)
PATIENT NAME:  Lori [name], Lori [name] MR#:  [id number] DATE OF BIRTH:  [birth year]  DATE OF PROCEDURE:  05/13/2013  PREOPERATIVE DIAGNOSES:  1. Left groin abscess.  2. End-stage renal disease requiring hemodialysis.  3. Complication arteriovenous dialysis device.  4. Superior vena cava syndrome.   POSTOPERATIVE DIAGNOSES:  1. Left groin abscess.  2. End-stage renal disease requiring hemodialysis.  3. Complication arteriovenous dialysis device.  4. Superior vena cava syndrome.   PROCEDURE PERFORMED: Excisional debridement of left groin wound with replacement of vacuum-assisted closure (VAC) wound dressing.   PROCEDURE PERFORMED BY: Katha Cabal, M.D.   ANESTHESIA: General by LMA.   FLUIDS: Per anesthesia record.   ESTIMATED BLOOD LOSS: Minimal.   SPECIMEN: Debrided tissue is not sent to pathology.   INDICATIONS: [name] is a 44 year old woman who is undergoing treatment for a left groin abscess. She has a short segment of approximately 2 cm of the Artegraft which is exposed. The anastomosis is completely covered. The graft is patent. She is, therefore, undergoing repetitive dressing changes. She has a very low pain threshold and has continued to require some debridement, and, therefore, this is being done under anesthesia.   DESCRIPTION OF PROCEDURE: The patient is taken to the operating room and placed in the supine position. After adequate general anesthesia is induced, the existing VAC dressing is removed. The area is then prepped with Betadine. Excisional debridement of the wound is then performed along the superior margin. The subcutaneous abdominal fat and small area of skin is debrided with a 15 blade scalpel. The fascia is granulating, so the muscles and the fascia did not require further debridement. Debrided tissue was all skin and subcutaneous. The area of the wound measures approximately 12 x 10 cm. There is significant increase in the granulation tissue covering the Artegraft  which is exposed. There is clearly good coverage over the anastomoses which are not visible. The wound is also more shallow in depth.   VAC is then replaced and set to 100 mmHg continuous suction. The patient tolerated the procedure well, and there were no immediate complications.   ____________________________ Katha Cabal, MD ggs:gb D: 05/13/2013 16:35:18 ET T: 05/13/2013 23:18:06 ET JOB#: [id number]  cc: Katha Cabal, MD, <Dictator> Katha Cabal MD ELECTRONICALLY SIGNED 06/02/2013 14:29

## 2015-03-18 NOTE — Consult Note (Signed)
History of Present Illness:  Reason for Consult DVT associated with PICC line placement.   HPI   Patient is a 44 year old female with end-stage renal disease who was recently admitted to hospital with sepsis-like symptoms as well as an infected left thigh AV graft and abdominal cellulitis.  Since admission, her symptoms have improved and she currently offers no complaints.  Crisoforo Oxford denies any arm pain.  She has no chest pain or shortness of breath.  She denies any nausea, vomiting, constipation, or diarrhea.  Patient otherwise feels well and offers no further specific complaints.  PFSH:  Additional Past Medical and Surgical History end-stage renal disease hypertension, CAD, CVA, migraine, osteoarthritis, GERD, asthma.  Tonsillectomy, thyroid surgery, tubal ligation and C-section.  Social history: Patient denies tobacco or alcohol.  Family history: Father with nephrotic syndrome. Mother with lung cancer.   Review of Systems:  Performance Status (ECOG) 1   Review of Systems   As per HPI. Otherwise, 10 point system review was negative.   NURSING NOTES: ED Vital Sign Flow Sheet:   01-Jun-14 00:55   Temp Temperature: 97.6   Pulse Pulse: 73   Respirations Respirations: 18   SBP SBP: 90   DBP DBP: 55   Pulse Ox % Pulse Ox %: 98   Pulse Ox Source Source: Room Air   Pain Scale (0-10) Pain Scale (0-10): Scale:9  NURSING NOTES: **Vital Signs.:   07-Jun-14 10:37   Vital Signs Type: Routine   Temperature Source: oral   Pulse Pulse: 74   Pulse source if not from Vital Sign Device: per cardiac monitor   Systolic BP Systolic BP: 89   Diastolic BP (mmHg) Diastolic BP (mmHg): 43   Mean BP: 58   Pulse Ox % Pulse Ox %: 65   Oxygen Delivery: Room Air/ 21 %   Pulse Ox Heart Rate: 68   Physical Exam:  Physical Exam General: Well-developed, well-nourished, no acute distress. Eyes: Pink conjunctiva, anicteric sclera. HEENT: Normocephalic, moist mucous membranes, clear  oropharnyx. Lungs: Clear to auscultation bilaterally. Heart: Regular rate and rhythm. No rubs, murmurs, or gallops. Abdomen: Soft, nontender, nondistended. No organomegaly noted, normoactive bowel sounds. Musculoskeletal: No edema, cyanosis, or clubbing. Neuro: Alert, answering all questions appropriately. Cranial nerves grossly intact. Skin: No rashes or petechiae noted. Psych: Normal affect.    Bee Stings: Anaphylaxis  Alteplase: N/V/Diarrhea, Dizzy/Fainting  Coumadin: Other    Synthroid 300 mcg (0.3 mg) oral tablet: 1 tab(s) orally once a day (at bedtime), Status: Active, Quantity: 0, Refills: None   Fosrenol 1000 mg oral tablet, chewable: 1 - 2 tab(s) chewed 3 times a day with meals, Status: Active, Quantity: 0, Refills: None   Nephro-Vite: 1 tab(s) orally once a day (in the morning), Status: Active, Quantity: 0, Refills: None   albuterol-ipratropium CFC free 100 mcg-20 mcg/inh inhalation aerosol: 2 puff(s) inhaled once a day and prn every 6 hours, Status: Active, Quantity: 0, Refills: None   Epi EZ Pen:  injectable , Status: Active, Quantity: 0, Refills: None   omeprazole 20 mg oral delayed release capsule: 1 cap(s) orally 2 times a day, Status: Active, Quantity: 0, Refills: None   Tums Ultra 1000 mg oral tablet, chewable: 1 tab(s) orally 3 times a day with each meal and with snacks, Status: Active, Quantity: 0, Refills: None   Vitamin D3: 50000 international unit(s) orally every 7 days on Sundays., Status: Active, Quantity: 0, Refills: None   Percocet 5/325 325 mg-5 mg oral tablet:  orally 1-2 every 6 hours, Status:  Active, Quantity: 0, Refills: None  Laboratory Results: Routine Chem:  07-Jun-14 06:50   Glucose, Serum 79  BUN  25  Creatinine (comp)  7.75  Sodium, Serum  134  Potassium, Serum 4.3  Chloride, Serum  95  CO2, Serum 31  Calcium (Total), Serum 8.7  Anion Gap 8  Osmolality (calc) 272  eGFR (African American)  7  eGFR (Non-African American)  6 (eGFR values  <35m/min/1.73 m2 may be an indication of chronic kidney disease (CKD). Calculated eGFR is useful in patients with stable renal function. The eGFR calculation will not be reliable in acutely ill patients when serum creatinine is changing rapidly. It is not useful in  patients on dialysis. The eGFR calculation may not be applicable to patients at the low and high extremes of body sizes, pregnant women, and vegetarians.)  Routine Hem:  07-Jun-14 06:50   WBC (CBC)  13.5 (Result(s) reported on 02 May 2013 at 07:12AM.)   Assessment and Plan: Impression:   Catheter-induced DVT, questionable allergy to Coumadin. Plan:   1.  DVT: Despite the fact that this is a catheter-induced upper extremity DVT, patient will require anticoagulation for 3 months. She reports a Coumadin allergy, but her symptoms do not appear to be adverse reactions to Coumadin.  Preferably would rechallenge with Coumadin which requires no renal dosing for patients on dialysis.  She cannot have Lovenox or Xarelto given her end-stage renal disease.  If Coumadin is not an option, she would require subcutaneous heparin injections at 250 units per kilogragram every 12 hours. (Unlabeled dosing.)  Recommend re-challenging with Coumadin with INR goal of 2.0-3.0.  This can be monitored by her primary nephrologist.  If her symptoms recur upon rechallenge with Coumadin, would switch to the above dosing of heparin for a total of 3 months. consult, call questions.  Electronic Signatures: FDelight Hoh(MD)  (Signed 07-Jun-14 18:27)  Authored: HISTORY OF PRESENT ILLNESS, PFSH, ROS, NURSING NOTES, PE, ALLERGIES, HOME MEDICATIONS, LABS, ASSESSMENT AND PLAN   Last Updated: 07-Jun-14 18:27 by FDelight Hoh(MD)

## 2015-03-18 NOTE — Consult Note (Signed)
Impression:   44yo female w/ h/o ESRD, s/p recent left thigh A-V graft admitted with probable possible infected A-V graft.     Surgery debrided the area around the graft.  DFrom the op note, it was not possible to determine if the graft itself was thought to be involved.  I will discussed this with vascular surgery.  The graft was exposed.  She is growing Providencia and Klebsiella.  Her WBC is still high.    Ceftriaxone will cover both organisms.  If anaerobes grow as well, we may need to change therapy.  As no resistant GPCs have been identified, will stop the vanco.  Will increase the ceftriaxone to 2g daily given her age.    TIf the A-V graft is is made from bovine material that ultimately dissolves, leaving no foreign material in place.  We will likely need to treat for 6-8 weeks with IV therapy.  Will decide on antibiotics based on culture results.biotics infected, then optimal therapy would be removal of the graft followed by 4 weeks of IV therapy.  If it is not feasible to remove the graft, then a shorter course of IV therapy followed by long term (perhaps lifelong) suppressive therapy would be needed. 4)     She still has significant pain in the area.  With the continued elevation in the WBC, this could indicate further undrained pus which may require more I&D.  Will repeat her CT scan.   Electronic Signatures: Shamirah Ivan MPH, Heinz Knuckles (MD) (Signed on 03-Jun-14 13:37)  Authored   Last Updated: 03-Jun-14 13:45 by Taiya Nutting MPH, Heinz Knuckles (MD)

## 2015-03-18 NOTE — Op Note (Signed)
PATIENT NAME:  Lori [name], Lori [name] MR#:  [id number] DATE OF BIRTH:  07/08/1971  DATE OF PROCEDURE:  04/29/2013  PREOPERATIVE DIAGNOSES:  1. Left groin abscess.  2. Superior vena cava syndrome.  3. End-stage renal disease, requiring hemodialysis.  4. Complication of dialysis device.   POSTOPERATIVE  DIAGNOSES:  1. Left groin abscess.  2. Superior vena cava syndrome.  3. End-stage renal disease, requiring hemodialysis.  4. Complication of dialysis device.   PROCEDURE PERFORMED: 1. Insertion of right arm peripherally inserted central catheter line with ultrasound and fluoroscopic guidance.  2. Debridement, left groin abscess.   PROCEDURE PERFORMED BY: Katha Cabal, MD   ANESTHESIA: General by LMA.   FLUIDS: Per anesthesia record.   ESTIMATED BLOOD LOSS: Minimal.   SPECIMEN: Debrided tissue is not sent to pathology.   INDICATIONS: Ms. Mada is a 44 year old woman with multiple medical problems, who has now developed an abscess of the left groin. She has undergone a left thigh loop graft with an Artegraft and attempts are being made at salvaging her access. She will require prolonged antibiotic therapy and therefore is also undergoing placement of PICC line as she has very poor venous access.   DESCRIPTION OF PROCEDURE: The patient is taken to the operating room and placed in the supine position. After adequate general anesthesia is induced, she is positioned supine with her right arm extended palm upward. Right arm is prepped and draped in a sterile fashion. Ultrasound is placed in a sterile sleeve. Ultrasound is utilized to demonstrate the deep veins of the right arm. Brachial vein is identified. It is echolucent and compressible, indicating patency. Image is recorded for the permanent record, and under real-time visualization, a micropuncture needle is used to access the vein, microwire followed by micro sheath. Wire is then evaluated under fluoroscopy and noted to extend up to the level  of the humeral head/axilla, but it does not advance beyond this. Hand injection of contrast through the micro sheath demonstrates obstruction of the central veins with extensive collaterals at this level. Given the patient's very limited access, it is elected to place the catheter to this level. Essentially, the catheter is positioned up to the level of the clavicle, but does not cross the midline. Microwire is reintroduced. Peel-away sheath is then exchanged, and a double-lumen PICC line is trimmed to 12 cm and advanced over the wire. Wire and peel-away sheath are removed. Sterile dressing is applied. The catheter is secured to the skin of the arm with silk sutures. The patient tolerated this procedure well, and attention is then turned to the left groin. Previous VAC dressing is removed. The wound was then evaluated. It is noted to show continued necrosis of the fat on the abdominal wall. The deeper areas near the graft appear to be pink and granulating well. No significant purulence is noted.   Excisional debridement of the left groin abscess/abdominal wall is then performed using a 15-blade scalpel as well as curved Mayo scissors. Tissue is debrided including the skin, abdominal fat and fascia of the abdominal wall. The tissues are debrided sharply with the scalpel and the scissors. Hemostasis is then obtained with a Bovie cautery. It is irrigated with 500 mL of GU irrigant, and a Mepitel followed by VAC sponge is placed. VAC dressing is secured. No leak is noted, and the patient is taken to the recovery room in stable condition.    ____________________________ Katha Cabal, MD ggs:OSi D: 04/29/2013 14:01:08 ET T: 04/29/2013 14:25:02 ET JOB#: [id number]  cc: Katha Cabal, MD, <Dictator> Heinz Knuckles. Blocker, MD Katha Cabal MD ELECTRONICALLY SIGNED 05/13/2013 16:15

## 2015-03-18 NOTE — Op Note (Signed)
PATIENT NAME:  Lori [name], Lori [name] MR#:  [id number] DATE OF BIRTH:  Feb 10, 1971  DATE OF PROCEDURE:  04/26/2013  PREOPERATIVE DIAGNOSIS:  Infected left groin seroma.   POSTOPERATIVE DIAGNOSIS: Infected left groin seroma.   PROCEDURE PERFORMED: Excisional debridement and drainage of infected left groin seroma.   SURGEON: Katha Cabal, M.D.   ANESTHESIA: General by LMA.   FLUIDS: Per anesthesia record.   ESTIMATED BLOOD LOSS: Minimal.   SPECIMEN: Aerobic, anaerobic cultures to microbiology; debrided tissue is not sent.   DESCRIPTION OF PROCEDURE: The patient is taken to the operating room and placed in the supine position. After adequate general anesthesia is induced, the left groin area is prepped and draped in a sterile fashion. Appropriate timeout is called.   An elliptical incision is made around the incision which shows evidence of skin necrosis and the subcutaneous tissues are entered. Fluid is encountered and this is cultured. The debridement is then carried down to the muscular layer, and to the fascial level.  Debridement is performed using both a 10 blade scalpel and straight Mayo scissors. Area of the abdominal wall just above the incision is quite firm, appears indurated, and shows some skin changes, and therefore the elliptical incision is extended superiorly creating almost a teardrop shape and this area of necrotic tissue is dissected using a 15 blade scalpel down to the abdominal fascia. The wound is then irrigated with 1000 mL of GU irrigant. Bleeding is controlled with 3-0 Vicryl and Bovie cautery. Mepitel and a silver sponge is then placed and the wound is covered. Wound measurements are 10 cm x 10 cm x approximately 8 cm in depth. VAC seal is adequate. The patient tolerated the procedure well and there were no immediate complications.  ____________________________ Katha Cabal, MD ggs:sb D: 04/26/2013 19:00:14 ET T: 04/27/2013 07:51:32 ET JOB#: [id number]  cc: Katha Cabal, MD, <Dictator> Katha Cabal MD ELECTRONICALLY SIGNED 04/29/2013 11:04

## 2015-03-18 NOTE — Op Note (Signed)
PATIENT NAME:  Lori [name], Lori [name] MR#:  [id number] DATE OF BIRTH:  Apr 16, 1971  DATE OF PROCEDURE:  08/19/2013  PREOPERATIVE DIAGNOSIS: Atherosclerotic occlusive disease, bilateral lower extremities with ulceration of the left foot.   POSTOPERATIVE DIAGNOSIS: Atherosclerotic occlusive disease, bilateral lower extremities with ulceration of the left foot.   PROCEDURE PERFORMED:  1.  Abdominal aortogram.  2.  Left lower extremity distal runoff, third order catheter placement.  3.  Crosser atherectomy, left superficial femoral artery.  4.  Percutaneous transluminal angioplasty with stent placement of the left superficial femoral artery.  5.  Closure using StarClose device, right common femoral artery.   SURGEON: Katha Cabal, M.D.   SEDATION: Precedex drip with supplemental fentanyl.   CONTRAST USED: Isovue 92 mL.   FLUORO TIME: 23.6 minutes.   ACCESS: A 7-French sheath, right common femoral artery.   INDICATION: Ms. Mccreless is a 44 year old woman with multiple medical problems, who has now developed ulcerations of her left foot. She has known atherosclerotic occlusive disease and recently underwent thrombectomy of her profunda, but was identified as having occlusion of her SFA. She is now undergoing angiography with the hope for intervention within the SFA and defining the distal runoff in the below-knee area.   DESCRIPTION OF PROCEDURE: The patient is taken to the special procedures suite and placed in the supine position. After adequate sedation has been achieved, the left arm is extended palm upward. The left arm is then prepped and draped in a sterile fashion. Ultrasound is placed in a sterile sleeve. Ultrasound is utilized secondary to lack of appropriate landmarks and to avoid vascular injury. Under direct ultrasound visualization, the brachial artery is identified approximately 3 fingerbreadths above the antecubital crease. Image is recorded for the permanent record. Under direct  ultrasound visualization, the brachial artery, which is noted to be homogeneous and pulsatile indicating patency, is accessed with a microneedle, microwire followed by microsheath. Microsheath is then secured and a transducer is applied. The patient's blood pressure will be monitored during the procedure through this arterial line.   Attention is then turned to the right groin, which is prepped and draped in a sterile fashion while  ultrasound is placed in a sterile sleeve. The common femoral artery is echolucent, homogeneous and pulsatile indicating patency. Image is recorded for the permanent record. Under direct ultrasound visualization after 1% lidocaine has been infiltrated, a microneedle is inserted into the anterior wall of the common femoral artery, a microwire followed by microsheath, J-wire followed by 5-French sheath. The pigtail catheter is positioned in the abdominal aorta and AP projection of the aorta is obtained. Images of the aorta from the previous angiogram were inadequate as the focus of the previous angiogram was an embolization to the profunda femoris.   Oblique view of the iliac system was then obtained after repositioning the pigtail catheter and subsequently using a stiff angled Glidewire and the pigtail catheter, the aortic bifurcation was crossed and the pigtail catheter was advanced into the profunda femoris. An Amplatz Super Stiff wire was then advanced through the pigtail catheter. Pigtail catheter was removed and a 7-French Ansell sheath was advanced up and over the bifurcation, positioned with its tip in the mid common femoral. Heparin 4000 units were given.   Oblique views in the LAO projection of the groin were then obtained through the [facility] sheath, and the stump of the SFA was identified. No residual emboli or thrombus is noted in the profunda femoris, which appears to be very well collateralized. Distally, there  is reconstitution above the knee and subsequently distal  runoff through third order catheter placement demonstrates the trifurcation is patent and the anterior and posterior tibial appear to be patent to the foot.   Using an angled Sidekick catheter as well as a Glidewire ultimately, a CXI catheter and a 14S device all in combination,  ultimately the SFA was crossed and hand injection of contrast verified intraluminal placement in the above-knee popliteal.  With the 14S Crosser catheter in the above-knee popliteal, an 0.014 wire was advanced to secure the wire access and subsequently a straight catheter was utilized to verify, as noted above, intraluminal placement.   A 3 x 30 balloon was then advanced over the wire and inflated to 12 atmospheres for a full 3 minutes. Followup angiography demonstrated recanalization, but with multiple areas of greater than 80% residual stenosis, and therefore a 4 x 20 balloon was advanced and 2 separate 3 minute inflations were made. The balloon was inflated to 10 atmospheres for both inflations. Followup angiography demonstrated no flow through the system at this point, and it was elected to then place stents. A 6 x 20 Life stent and subsequently a 6 x 80 Life stent were then deployed and post dilated with a 5 x 20 balloon. Post dilatation was to 10 atmospheres for 15 to 20 seconds. Followup angiography now demonstrated a widely patent system with rapid flow of contrast down to the below-knee area and filling of all tibial vessels.   The sheath was then pulled into the right external iliac and oblique view obtained and a StarClose device deployed successfully. There was no immediate complications.   INTERPRETATION: Initial views demonstrate that the aorta and the iliac system are widely patent. There is no evidence of hemodynamically significant stenoses. Profunda femoris in the LAO projection is widely patent without evidence of residual thrombus or emboli. SFA has a flush occlusion at its origin. There is above-knee  reconstitution of the popliteal and there appears to be 3 vessel runoff to the foot with the peroneal being somewhat small and diminutive.   Following Crosser atherectomy and successful crossing of the occlusion, there is angioplasty which does not allow adequate recanalization and therefore Life stents are placed, 6 mm Life stents are deployed and post dilated to 5 mm throughout their entire course. Of note, there was a focal area of high-grade residual stenosis that was post dilated with a 6 x 2 balloon. This was near the origin.   SUMMARY: Successful recanalization of occluded superficial femoral artery as described above.    ____________________________ Katha Cabal, MD ggs:aw D: 08/19/2013 11:00:51 ET T: 08/19/2013 11:10:58 ET JOB#: [id number]  cc: Katha Cabal, MD, <Dictator> Katha Cabal MD ELECTRONICALLY SIGNED 08/28/2013 10:02

## 2015-03-18 NOTE — Op Note (Signed)
PATIENT NAME:  Lori [name], Lori [name] MR#:  [id number] DATE OF BIRTH:  12-Jun-1971  DATE OF PROCEDURE:  06/24/2013  PREOPERATIVE DIAGNOSIS:  Left groin abscess.   POSTOPERTIVE DIAGNOSIS:  Left groin abscess.    PROCEDURES PERFORMED: 1.  Excisional debridement of left groin wound.  2.  Replacement of VAC wound dressing.   SURGEON: Katha Cabal, M.D.   ANESTHESIA: General by LMA.   FLUIDS: Per anesthesia record.   ESTIMATED BLOOD LOSS: Minimal.   SPECIMEN: Debrided tissue was not sent to pathology.   INDICATIONS: Ms. Pachuta returns for her weekly wound VAC change. She undergoes VAC changes in OR secondary to her extreme intolerance to pain. Risks and benefits were reviewed. All questions answered. The patient agrees to proceed.   DESCRIPTION OF PROCEDURE: The patient is taken to the operating room and placed in the supine position. After adequate general anesthesia is induced, she is positioned supine and her left groin area is inspected. The existing VAC is removed. The wound and groin area is then prepped with Betadine paint.   Inspection demonstrates several areas desiccated fat, but the overall bed of the wound is richly granulated. The area of exposed graft is now approximately half the size it was yesterday.   Taking forceps, and a 15 blade scalpel, desiccated fat is excised and passed off the field. Approximately three areas were debrided by excising the desiccated fat and removing it. Again, scalpel was used.   At the completion of the debridement, the wound is reinspected. It is then measured. Today, the measurement is 12 cm long x 7 cm wide x 5 cm deep.   Mepitel is then placed covering the graft and a black wound VAC sponge is trimmed to an appropriate shape and placed in the wound, covered and then vac is applied. Excellent seal is noted. The patient tolerated the procedure well and there were no immediate complications.     ____________________________ Katha Cabal,  MD ggs:cc D: 06/24/2013 14:47:27 ET T: 06/24/2013 15:19:32 ET JOB#: [id number]  cc: Katha Cabal, MD, <Dictator> Katha Cabal MD ELECTRONICALLY SIGNED 07/07/2013 8:48

## 2015-03-18 NOTE — Op Note (Signed)
PATIENT NAME:  Lori [name], Lori [name] MR#:  [id number] DATE OF BIRTH:  1971-09-03  DATE OF PROCEDURE:  05/01/2013  PREOPERATIVE DIAGNOSES:  1. Lack of appropriate intravenous access.  2. Abscess, left groin.  3. End-stage renal disease, requiring hemodialysis.  4. Superior vena cava syndrome with extensive history of central venous strictures and occlusions.   POSTOPERATIVE DIAGNOSES:  1. Lack of appropriate intravenous access.  2. Abscess, left groin.  3. End-stage renal disease, requiring hemodialysis.  4. Superior vena cava syndrome with extensive history of central venous strictures and occlusions.   PROCEDURES PERFORMED:  1. Introduction catheter, left innominate vein.  2. Contrast injection, left arm venography.  3. Percutaneous transluminal angioplasty to 3 mm, left axillary vein.  4. Placement of a double lumen peripherally inserted central catheter line with ultrasound and fluoroscopic guidance.   PROCEDURE PERFORMED BY: Katha Cabal, MD   SEDATION: Fentanyl 250 mcg administered IV.   ACCESS: Left brachial vein.   CONTRAST USED: Isovue 45 mL.   FLUOROSCOPY TIME: 18.8 minutes.   INDICATIONS: Ms. Haraway is a 44 year old woman with multiple extensive medical problems, who has a nonfunctioning right arm PICC line. The indication for placement of a second PICC line is requiring IV antibiotics for greater than 5 days.   DESCRIPTION OF PROCEDURE: The left arm is prepped and draped in a sterile fashion. Ultrasound is placed in a sterile sleeve. Ultrasound is utilized secondary to lack of appropriate landmarks and to avoid vascular injury. Under direct ultrasound visualization, the brachial veins are identified. They are echolucent and homogeneous, indicating patency. Image is recorded for the permanent record, and under direct visualization, the brachial vein is accessed with a micropuncture needle, microwire followed by micro sheath is inserted. The wire does not travel past the axilla,  and ultimately, injection of contrast is performed, demonstrating multilevel strictures. A variety of different wires and catheters ultimately are utilized. In the end, a KMP catheter through a 5 French peel-away sheath with a gold-tipped Glidewire is negotiated through several strictures and several branches and ultimately passed to the level of the innominate vein on the left. Hand injection of contrast is utilized to demonstrate the central venous anatomy via the KMP catheter and a straight glide catheter. Ultimately, several 3 mm balloons are utilized to angioplasty several strictures that allow passage of the catheter, and the catheter is positioned with its tip at the level of the distal innominate vein. Hand injection of contrast confirms this. The catheter is then secured to the skin of the arm and flushed with heparinized saline. Sterile dressing is applied, including a Biopatch. The patient tolerated the procedure well, and there were no immediate complications.    ____________________________ Katha Cabal, MD ggs:OSi D: 05/01/2013 18:08:15 ET T: 05/02/2013 06:51:58 ET JOB#: [id number]  cc: Katha Cabal, MD, <Dictator> Katha Cabal MD ELECTRONICALLY SIGNED 05/13/2013 16:15

## 2015-03-18 NOTE — Op Note (Signed)
PATIENT NAME:  Lori [name], Lori [name] MR#:  [id number] DATE OF BIRTH:  1971-02-22  DATE OF PROCEDURE:  07/01/2013   PREOPERATIVE DIAGNOSIS:  Left groin abscess.   POSTOPERATIVE DIAGNOSIS:  Left groin abscess.   PROCEDURE PERFORMED: Wound VAC change, left groin.   SURGEON: Katha Cabal, M.D.   ANESTHESIA: General by LMA.   FLUIDS: Per anesthesia record.   ESTIMATED BLOOD LOSS: None.   SPECIMEN: None.   INDICATIONS: [name] is a 44 year old woman who presents for a wound VAC change under anesthesia secondary to intolerance and pain control issues. Risks and benefits were reviewed. The patient has agreed to proceed.   DESCRIPTION OF PROCEDURE: The patient is taken to the operating room, and placed in the supine position. After adequate general anesthesia is induced, the left groin is exposed. Existing wound VAC dressing is removed. The wound is then prepped and draped in a sterile fashion. The wound is now richly granulating. The graft is essentially covered completely. Black sponge  is then trimmed to the appropriate size. A small piece of Mepitel is placed over the area of the graft itself, and then the [facility] dressing is replaced. Seal is excellent. The patient tolerated the procedure well, and there were no immediate complications. The wound measures 14 cm x 6 cm x 4 cm deep.    ____________________________ Katha Cabal, MD ggs:dmm D: 07/01/2013 10:10:00 ET T: 07/01/2013 10:45:23 ET  JOB#: [id number] [name] MD ELECTRONICALLY SIGNED 07/07/2013 8:50

## 2015-03-18 NOTE — Consult Note (Signed)
Brief Consult Note: Diagnosis: infected left groin seroma.   Patient was seen by consultant.   Recommend to proceed with surgery or procedure.   Comments: plan I&D hope for flap coverage.  Electronic Signatures: Hortencia Pilar (MD)  (Signed 01-Jun-14 11:30)  Authored: Brief Consult Note   Last Updated: 01-Jun-14 11:30 by Hortencia Pilar (MD)

## 2015-03-18 NOTE — Op Note (Signed)
PATIENT NAME:  Lori [name], Lori [name] MR#:  [id number] DATE OF BIRTH:  17-Jul-1971  DATE OF PROCEDURE:  05/27/2013  PREOPERATIVE DIAGNOSIS:  Left groin abscess.  POSTOPERATIVE DIAGNOSIS:  Left groin abscess.   PROCEDURES PERFORMED: 1.  Excisional debridement left groin wound.  2.  Placement of vac dressing.   PROCEDURE PERFORMED BY:  Katha Cabal, M.D.   ANESTHESIA:  General by LMA.   FLUIDS:  Per anesthesia record.   ESTIMATED BLOOD LOSS:  Minimal.   SPECIMEN:  Debrided abdominal wall fat and subcutaneous tissues are passed off the field.  These are not sent to pathology.   INDICATIONS:  [name] is a 44 year old woman with multiple medical problems who is undergoing weekly vac dressing changes to her left groin as she has an AV graft which is exposed, but healing.  The risks and benefits for debridement were reviewed. All questions answered.  The patient has agreed to proceed.   DESCRIPTION OF PROCEDURE:  The patient is taken to the Operating Room and placed in the supine position.  After adequate general anesthesia is induced, appropriate invasive monitors are placed.  Her existing vac dressing is removed and the left groin and abdominal wall are prepped and draped in a sterile fashion.  Appropriate timeout is called.   The base of the wound, the abdominal fascia is not completely covered with granulation tissue.  The area of exposed graft has now decreased in size to approximately the size of a dime, however the shelf of the abdominal wall again shows the fat has desiccated significantly along its exposed surface.  There is significantly paucity of granulation tissue throughout the abdominal fat.   Using a #10 blade scalpel and forceps with teeth, the abdominal fat, skin and subcutaneous tissues where necessary are debrided.  Tissues are then passed off the field.  As noted, the fascia appears to be healing nicely with the confluence of granulation.   The wound is then irrigated,  hemostasis is obtained with 3-0 Vicryl and Bovie cautery.  The wound is then dressed with Mepitel followed by a vac sponge and then vac dressing is achieved with good seal.  There are no immediate complications and the patient tolerated the procedure quite well.     ____________________________ Katha Cabal, MD ggs:ea D: 05/27/2013 17:16:15 ET T: 05/28/2013 00:33:40 ET JOB#: [id number]  cc: Katha Cabal, MD, <Dictator> Katha Cabal, MD Katha Cabal MD ELECTRONICALLY SIGNED 06/02/2013 14:31

## 2015-03-18 NOTE — Op Note (Signed)
PATIENT NAME:  Lori [name], COXE MR#:  [id number] DATE OF BIRTH:  05-10-1971  DATE OF PROCEDURE:  05/05/2013  PREOPERATIVE DIAGNOSIS:  Left groin abscess.   POSTOPERATIVE DIAGNOSIS: Left groin abscess.  PROCEDURE PERFORMED: Debridement left groin wound with replacement of VAC wound dressing.   SURGEON: Katha Cabal, MD  ANESTHESIA: General by endotracheal intubation.   FLUIDS: Per anesthesia record.   ESTIMATED BLOOD LOSS: Minimal.   SPECIMEN: Debrided tissue is not sent to pathology.   INDICATIONS: [name] is a 44 year old woman has been undergoing attempts at salvaging her left thigh loop graft. She is currently undergoing VAC dressing changes with further debridement being performed in the operating room. The risks and benefits were reviewed, all questions answered. The patient has agreed to proceed.   DESCRIPTION OF PROCEDURE: The patient is taken to the operating room, placed in the supine position. After adequate general anesthesia is induced, the existing VAC dressing is removed. The wound and groin area are prepped and draped with Betadine. Appropriate timeout is called. The area is then prepped and draped in a sterile fashion.   The wound in its base as well as the inferior margin and surrounding the graft appears to have abundant healthy granulation tissue. The adipose tissue of the abdominal wall in the superior margin continues to desiccate and appeared nonviable. Therefore, skin as well as the subcutaneous fatty tissues are debrided with a 10 blade scalpel down to the abdominal fascia. The fascia is healthy and not debrided. The wound is then irrigated, hemostasis is obtained with Bovie cautery, and a Mepitel followed by a VAC sponge is placed. VAC dressing is then secured, tested. It is without leaks.  The patient is awakened in the operating room in stable condition. She is transferred to the PACU without incident.   ____________________________ Katha Cabal,  MD ggs:cb D: 05/05/2013 18:45:02 ET T: 05/05/2013 22:05:29 ET JOB#: [id number]  cc: Katha Cabal, MD, <Dictator> Katha Cabal MD ELECTRONICALLY SIGNED 05/13/2013 16:15

## 2015-03-18 NOTE — H&P (Signed)
PATIENT NAME:  Lori [name], BACHER MR#:  [id number] DATE OF BIRTH:  Feb 08, 1971  DATE OF ADMISSION:  04/26/2013  PRIMARY CARE PHYSICIAN:  She has no primary care physician, but she follows up with her nephrologist, Dr. Sherley Bounds.  REFERRING PHYSICIAN:  Dr. Marta Antu.   CHIEF COMPLAINT:  Tenderness, pain and expanding erythema of the left lower abdomen and groin area.   HISTORY OF PRESENT ILLNESS:  Lori [name] is a 44 year old pleasant Caucasian female with history of end-stage renal disease on hemodialysis.  She had left thigh AV graft done on April 30th, a month ago, and since that time patient tells me that she has constant pain in that area associated with gradual increase in swelling and redness and oozing of fluid.  The patient was seen at Dr. Nino Parsley clinic and she was placed on oral antibiotic, however her symptoms worsened.  She is scheduled to have CAT scan of the abdomen on this coming Monday, however due to worsening symptoms and pain she decided to come to the Emergency Department.  The patient was admitted to the hospital in consultation with vascular surgery Dr. Delana Meyer.  The patient has expanding cellulitis, abscess formation and necrosis at the left groin area and left lower abdomen with surrounding cellulitis and abscess formation.  The AV graft area appears to be infected.  Also noted that her blood pressure is borderline low.  For this reason, I will admit the patient to the intensive care unit and to further stabilization.   REVIEW OF SYSTEMS:  CONSTITUTIONAL:  Denies any fever, but she has some chills.  No night sweats.  EYES:  No blurring of vision.  No double vision.  EARS, NOSE, THROAT:  No hearing impairment.  No sore throat.  No dysphagia.  CARDIOVASCULAR:  No chest pain.  No shortness of breath.  No syncope.  RESPIRATORY:  No cough.  No shortness of breath.  No wheezing.  No hemoptysis.  GASTROINTESTINAL:  No abdominal pain other than the tenderness at the left lower  quadrant and left groin area from the abscess and cellulitis.  No vomiting.  No melena or hematochezia.  GENITOURINARY:  No dysuria.  No frequency of urination.  MUSCULOSKELETAL:  No joint pain or swelling.  No muscular pain or swelling other than the abscess and cellulitis of the left groin area.  INTEGUMENTARY:  She has cellulitis and abscess formation at the left groin and left lower abdomen with central necrotic ulcer.  There is tenderness as well.  NEUROLOGY:  No focal weakness.  No seizure activity.  No headache.  PSYCHIATRY:  No anxiety.  No depression.  ENDOCRINE:  No polyuria or polydipsia.  No heat or cold intolerance.   PAST MEDICAL HISTORY:  End stage renal disease on hemodialysis on Tuesday, Thursday and Saturday, systemic hypertension, coronary artery disease, history of stroke, migraine headaches, osteoarthritis, asthma, gastroesophageal reflux disease.   PAST SURGICAL HISTORY:  AV graft surgery on 03/25/2013, history of tonsillectomy, thyroid surgery and removal of parathyroid glands.  History of tubal ligation and C-section.    SOCIAL HABITS:  Nonsmoker.  No history of alcohol or drug abuse.   SOCIAL HISTORY:  She is married, living with her husband and her daughter.   FAMILY HISTORY:  Her father died from complications of nephrotic syndrome.  Her mother died in her 8s from lung cancer.  She was a smoker.   ADMISSION MEDICATIONS:  1.  Synthroid 300 mcg once a day.    2.  Vitamin D3  50,000 units once taken once a month.  3.  Omeprazole 20 mg twice a day.  4.  Percocet 5/325 1 to 2 tablets q. 6 hours as needed.  5.  Nephro-Vite multivitamin once a day.  6.  Fosrenol 1000 mg 1 to 2 tablets 3 times a day.  7.  Albuterol with ipratropium 2 puffs once a day.   ALLERGIES:  BEE STING, ALTEPLASE AND COUMADIN.   PHYSICAL EXAMINATION: VITAL SIGNS:  Blood pressure 87/54, however there is variability in blood pressure measurements due to her vascular problem on multiple grafts, in  the past blood pressure obtained was 123XX123 systolic.  Respiratory rate 18, pulse 75, temperature 98.1.  Oxygen saturation 97%.  GENERAL APPEARANCE:  Young female who looks much older than her stated age of 44 year old.  In mild distress because of the pain on the left groin area.  HEAD AND NECK:  Mild pallor.  No icterus.  No cyanosis.  EARS, NOSE, THROAT:  Ear examination revealed normal hearing, no discharge, no ulcers.  Examination of the nose showed normal findings without discharge.  No bleeding.  No ulcers.  Oropharyngeal examination revealed normal lips and tongue.  No oral thrush, no exudate.  EYES:  Examination revealed normal eyelids and conjunctivae.  Pupils about 5 mm.  The right pupil is slightly larger than the left.  Both reactive to light.  NECK:  Supple.  Trachea at midline.  No thyromegaly.  No cervical lymphadenopathies.  There is anterior scar tissue at the lower anterior neck from previous thyroid surgery.  HEART:  Revealed normal S1, S2.  No S3, S4.  There is a grade 2 over 6 mid systolic murmur at the left sternal border.  No carotid bruits.  RESPIRATORY:  Revealed normal breathing pattern without use of accessory muscles.  No rales.  No wheezing.  ABDOMEN:  Soft except at the left lower quadrant area.  She is tender.  There is central necrosis about 3 inches in length with surrounding swelling and redness.  Could not palpate for organomegaly as the patient is uncomfortable because of the tenderness at the left lower quadrant area.  No hernias.  MUSCULOSKELETAL:  No joint swelling.  No clubbing.  NEUROLOGIC:  Cranial nerves II through XII are intact.  No focal motor deficit.  PSYCHIATRY:  The patient is alert, oriented x 3.  Mood and affect were normal.   LABORATORY FINDINGS:  Her chest x-ray showed cardiomegaly with no acute cardiopulmonary disease.  CT scan of the abdomen and pelvis revealed findings consistent with previous arteriovenous anastomosis in the left groin with  surgical drain present and evidence of an abscess, significant subcutaneous emphysema adjacent to the area.  The graft appears to be patent.  There is extensive atherosclerotic calcifications.  There is hyper-enhancing area in the right lobe of the liver adjacent to the falciform ligament, may represent hemangioma.  Her serum glucose 114, BUN 16, creatinine 5.3, sodium 135, potassium 3.2.  Total protein 7.7, albumin 3.1.  Normal liver transaminases.  CBC showed a white count of 20,000, hemoglobin 11, hematocrit 35, platelet count 479.  Prothrombin time 14, INR 1.1.  APTT 44.   ASSESSMENT: 1.  Cellulitis and abscess formation at the left lower abdomen and groin area.  2.  Infected ACV vascular graft at the left groin area.   3.  End stage renal disease on hemodialysis.    4.  Hypotension, unsure if this is real or due to her vascular problems.  The best blood pressure  obtained was 123XX123 systolic.  5.  Asthma without exacerbation.  This is stable.  6.  History of stroke.  7.  History of hypertension.  8.  History of coronary artery disease.  9.  Mild hypokalemia.   PLAN:  We will admit the patient to the Intensive Care Unit given the uncertainty about blood pressure for better monitoring until further stabilization.  I will give a bolus of normal saline 250 x 1.  Blood cultures x 2.  IV antibiotic using vancomycin and Zosyn.  Blood cultures and wound culture.  Vascular consultation with Dr. Delana Meyer.  He was already called and notified by Emergency Department physician.  He wants the medical team to take care of the patient and he will be consulted.  I will also consult nephrology for inpatient hemodialysis.  I will continue her home medications as listed above.   Time spent in evaluating this patient took more than 1 hour.      ____________________________ Clovis Pu. Lenore Manner, MD amd:ea D: 04/26/2013 01:00:19 ET T: 04/26/2013 01:57:07 ET JOB#: [id number]  cc: Clovis Pu. Lenore Manner, MD, <Dictator> Mike Craze Irven Coe MD ELECTRONICALLY SIGNED 04/27/2013 1:09

## 2015-03-18 NOTE — Discharge Summary (Signed)
PATIENT NAME:  Lori [name], BUNCH MR#:  [id number] DATE OF BIRTH:  12-30-1970  DATE OF ADMISSION:  04/26/2013 DATE OF DISCHARGE:  05/21/2013   Please refer to interim discharge summaries dictated on 05/14/2013 by Dr. Anselm Jungling, on 05/07/2013 by Dr. Bridgette Habermann and on 05/01/2013 by Dr. Tressia Miners.   ADMISSION DIAGNOSIS: Tenderness, pain and expanding erythema of the left lower abdomen and groin area.   PROCEDURES:  1. On 04/26/2013, left groin infected seroma excisional debridement and drainage of infected left seroma.  2. On 05/01/2013, lack of appropriate intravenous access, left abscess of the groin. The patient underwent an introduction of catheter in left innominate vein, contrast injection, left arm venography, percutaneous transluminal angioplasty to 3 mm of the left axillary vein, placement of double-lumen peripherally inserted central catheter with ultrasound and fluoroscopic guidance.  3. On 05/05/2013, debridement of left groin wound with placement of wound vacuum-assisted closure. 4. On 05/08/2013, excisional debridement of left groin, placement of wound vacuum-assisted closure, dressing change.  5. On 05/13/2013, excisional debridement of left groin wound with replacement of wound vacuum-assisted closure assisted closure, wound dressing change. 6. On 05/20/2013, excisional debridement of left groin abscess, replacement of wound vacuum-assisted closure dressing, but at this point, the patient had 0.5 cm thickness of the skin and abdominal fat excised, necrotic tissue was passed off the field, granulation tissue is growing up nicely. AV graft is actually being covered quite well.   My discharge summary will cover the days from June 20th to June 26.   DIAGNOSES: 1. Cellulitis and abscess of the left groin, abdominal wall and left thigh.  2. Acute on chronic anemia.  3. Chronic hypotension.  4. Septic shock.  5. Right upper extremity deep vein thrombosis.  6. Hyponatremia.  7. End-stage renal  disease.  8. Hypothyroidism.  HOSPITAL COURSE:  1. In general, the patient was admitted with groin cellulitis and infection, was seen by Dr. Delana Meyer and Dr. Lucky Cowboy. Had debridement done on admission and several debridements done through the hospitalization with wound VAC and changes as mentioned above on the procedures. The patient had grown Klebsiella, Providencia, Peptoniphilus, and that was grown from wound cultures, and Klebsiella as well, for which the patient was started on ceftriaxone and metronidazole by Dr. Clayborn Bigness. The patient was kept in the hospital because the debridements needed to be done inpatient. The patient had a fistula that is exposed, and right now has been on wound VAC, which has closed the wound, and the fistula is protected now, for which the patient is going to be able to be discharged. I discussed the case with Dr. Delana Meyer, and he does not want anybody else to do the wound care other than himself. He is going to do it on an outpatient basis through the OR weekly. So, the patient is improving, healing the ulcer. No signs of abscesses. Infection needs again to be treated up to 07/05/2013.  2. Sepsis. The patient was seen by Dr. Clayborn Bigness, started on ceftriaxone and metronidazole through 07/05/2013. The patient is now hemodynamically stable. The sepsis has resolved very well.  3. Anemia. The patient has a history of chronic disease anemia due to end-stage renal disease. Her hemoglobin has dropped in the past, for which she has required a transfusion of 1 unit of blood on June 14th. Overall, her hemoglobin remained stable.  4. Hypotension. The patient has been having issues with her blood pressure staying in the 90s and 80s likely due to sepsis. She was put on Midodrine twice daily,  but now she is off of it, and her blood pressure remains on the low side of normal. No signs of orthostatic hypotension.  5. Right upper extremity deep vein thrombosis. The patient has developed this DVT. It has  been mostly asymptomatic other than for a little bit of swelling. The patient is at high risk of bleeding. She is not going to be on Coumadin. She was evaluated by Dr. Grayland Ormond, and the case was discussed between Dr. Grayland Ormond and Dr. Delana Meyer. They decided not to start anticoagulation as the patient has low probability of pulmonary embolization through this. 6. As far as her other medical problems, they were stable during this hospitalization.  7. The patient remained this period of time in the hospital mostly because of the surgical need for debridement and OR changes of the wound VAC.   CONSULTATIONS: The patient was seen by Dr. Candiss Norse, Dr. Holley Raring, Dr. Juleen China, Dr. Clayborn Bigness, Dr. Delana Meyer, Dr. Lucky Cowboy.   IMPORTANT LABORATORY TESTS DURING THIS LAST WEEK: The patient had a phosphorus of 5.7, creatinine of 8.85, hemoglobin of 9.1 at discharge.   DISPOSITION: I reviewed the case fully, discussed the case with the patient, Dr. Delana Meyer, case management nurse, and the patient is able to be discharged today without any major problems after hemodialysis. Instructions have been written for the patient.   MEDICATIONS AT DISCHARGE: 1. Synthroid 300 mcg at bedtime. 2. Fosrenol/lanthanum carbonate 1000 mg 3 times a day. 3. Nephro-Vite once a day.  4. Albuterol/ipratropium inhaler 2 puffs q.6 hours p.r.n.  5. EpiPen as needed for allergic reaction.  6. Omeprazole 20 mg twice daily. 7. Tums Ultra 1000 mg 3 times a day.  8. Vitamin D 50,000 international units every 7 days. 9. Metronidazole 500 mg every 8 hours for 45 days up until 07/05/2013.  10. Percocet 5/325 mg tablets as needed for pain. 11. Ceftriaxone 2 grams every 24 hours IV through the PICC line for 45 days up to 07/05/2013.  FOLLOWUP:  1. Dr. Clayborn Bigness weekly.  2. Dr. Delana Meyer weekly.  3. Primary care physician in 2 to 3 weeks.   TIME SPENT: I spent about 75 minutes with this discharge today.   ____________________________ Calamus Sink,  MD rsg:OSi D: 05/21/2013 11:20:36 ET T: 05/21/2013 11:48:49 ET JOB#: [id number]  cc: [place] Sink, MD, <Dictator> Murlean Iba, MD Mamie Levers, MD Heinz Knuckles. Blocker, MD Katha Cabal, MD [place] Lilian Kapur, MD Cristi Loron MD ELECTRONICALLY SIGNED 05/27/2013 15:02

## 2015-03-18 NOTE — Consult Note (Signed)
Brief Consult Note: Diagnosis: AV graft infection, Mult organisms, including ESBL E coli. s/p removal of most of graft.   Patient was seen by consultant.   Consult note dictated.   Recommend further assessment or treatment.   Orders entered.   Comments: Cont meropenem while inpatient Can likely change to oral abx at time of dc The E coli fortunately is sensitive to bactrim.  The other organisms can be covered by either bactrim or augmentin. Will need 6 weeks at least of abx -  if worsens may need IV abx Thank you for consult. Will follow with you.  Electronic Signatures: Angelena Form (MD)  (Signed 03-Sep-14 08:59)  Authored: Brief Consult Note   Last Updated: 03-Sep-14 08:59 by Angelena Form (MD)

## 2015-03-18 NOTE — Consult Note (Signed)
PATIENT NAME:  Lori [name], Lori [name] MR#:  [id number] DATE OF BIRTH:  07/11/1971  DATE OF CONSULTATION:  04/28/2013  REFERRING PHYSICIAN:  Gladstone Lighter, MD CONSULTING PHYSICIAN:  Heinz Knuckles. Marlon Vonruden, MD  REASON FOR CONSULTATION: Possible AV graft infection.   HISTORY OF PRESENT ILLNESS: The patient is a 44 year old female with a past history significant for end-stage renal disease on hemodialysis status post recent AV graft placement in the left groin on 03/25/2013 who has had pain at the surgical site, in the left abdomen, since shortly after surgery. The patient had noticed increasing pain mainly in the lower left quadrant of the abdomen with induration, tenderness and swelling. She was seen by vascular surgery and an ultrasound demonstrated possible seroma. She was given oral antibiotics, but continued to have significant pain. She did not have fevers or chills but did have sweats. She had some drainage from the initial surgical site that was initially serosanguineous, but then became more yellowish and green. She was admitted to the hospital on 06/01 and underwent I and D which demonstrated significant skin necrosis and fluid was encountered. This was cultured. The surgery continued to demonstrate further necrotic tissue and this was debrided down to the abdominal fascia. She was started on vancomycin and Zosyn. The Zosyn was changed to ceftriaxone after cultures have grown Providencia and Klebsiella. The patient continues to have pain in the left side of the abdomen. She is very tender to touch. She denies any fevers or chills currently. No nausea or vomiting. No change in her bowels.   ALLERGIES: ALTEPLASE, COUMADIN AND BEE STINGS.   PAST MEDICAL HISTORY: 1.  End-stage renal disease on hemodialysis.  2.  Recent AV fistula placement in the left groin.  3.  Hypertension.  4.  Coronary artery disease.  5.  Stroke.  6.  Migraine headaches.  7.  Osteoarthritis.  8.  Asthma.  9.  GERD.  10.  Status  post tonsillectomy.  11.  Status post parathyroidectomy.   SOCIAL HABITS:  She lives with her husband and other family members. She does not smoke. She does not drink. No injecting drug use history.   FAMILY HISTORY: Positive for nephrotic syndrome in her father, lung cancer in her mother. There is also diabetes in the family.   REVIEW OF SYSTEMS:  GENERAL: No fevers. Positive sweats. No chills. Positive malaise.  HEENT: No headaches or sinus congestion. No sore throat.  NECK: No stiffness. No swollen glands.  RESPIRATORY: No cough. No shortness of breath. No sputum production.  CARDIAC: No chest pains or palpitations.  GASTROINTESTINAL: No nausea. No vomiting. Positive abdominal pain in the left lower quadrant with tenderness. No change in her bowels.  GENITOURINARY: No change in her urine.  MUSCULOSKELETAL: No joint or muscle tenderness.  SKIN: She has had some induration with some mild erythema over the left lower quadrant of the abdomen. She has had some drainage from the surgical wound, as described above. No other rashes have been noted.  NEUROLOGIC: No focal weakness.  PSYCHIATRIC: No complaints. All other systems are negative.   PHYSICAL EXAMINATION: VITAL SIGNS: T-max of 98.5, T-current 97.4, pulse 64, blood pressure 99/57 and 100% on 2 liters.  GENERAL: A 44 year old white female appearing older than stated age, but in no acute distress.  HEENT: Normocephalic, atraumatic. Pupils equal and reactive to light. Extraocular motion intact. Sclerae, conjunctivae and lids are without evidence for emboli or petechiae. Oropharynx shows no erythema or exudate. Gums are in fair condition.  NECK: Supple.  Full range of motion. Midline trachea. No lymphadenopathy. No thyromegaly.  LUNGS: Clear to auscultation bilaterally with good air movement. No focal consolidation.  HEART: Regular rate and rhythm without murmur, rub or gallop.  ABDOMEN: Soft other than in the left lower quadrant where she  had induration. The left lower quadrant was extremely tender to even light touch. The tenderness was fairly well demarcated with some erythematous changes and induration.  The discomfort extended fairly far laterally but not quite to the midline medially. The lower aspect of discomfort extended down into the groin and to her surgical site. She has a new surgical site that has a wound VAC in place. This area was tender throughout. Further down in the groin the AV graft surgical site appeared to be healing well without significant erythema and with minimal tenderness. She had no significant rebound or guarding with palpation of the abdomen. She had no hernias noted.  EXTREMITIES: No evidence for tenosynovitis. No edema.  SKIN: No rashes other than that described above. There was no stigmata of endocarditis, specifically no Janeway lesions or Osler nodes.  NEUROLOGIC: This is awake and interactive, moving all 4 extremities.  PSYCHIATRIC: Mood and affect appeared normal.   LABORATORY AND DIAGNOSTICS: BUN of 39, creatinine of 9.78, sodium of 125, bicarbonate of 27 and anion gap of 12. LFTs were unremarkable. White count of 23.6 with a hemoglobin of 10.1, platelet count of 504 and ANC of 16.6. White count on admission was 20.2. Blood cultures from admission show no growth. A wound culture labeled left lower abdomen is growing Providencia rettgeri gram.  A wound culture labeled left groin is growing Klebsiella pneumoniae.   A chest x-ray from admission showed no cardiopulmonary disease. A CT scan of the abdomen and pelvis demonstrated the previous AV anastomosis in the left groin with a surgical drain present and evidence of an abscess and significant subcutaneous emphysema adjacent to the area. There was no intraperitoneal abscess noted.   IMPRESSION: A 44 year old female with a past history significant for end-stage renal disease on hemodialysis status post recent left thigh AV graft placement who was admitted  with probable infected AV graft.  RECOMMENDATIONS: 1.  Surgery debrided the area around the graft. I discussed this with vascular surgery. The graft was exposed. She is growing Providencia and Klebsiella. Her white count remains high.  2.  Ceftriaxone will cover both organisms. If anaerobes grow as well, we may need to change therapy. There is no resistant gram-positive cocci that have been identified and will stop the vancomycin. We will increase the ceftriaxone to 2 grams daily given her age.  3.  The AV graft is made from bovine material that ultimately dissolves, leaving no foreign material in place. Will likely need to treat for 6 to 8 weeks of IV therapy. Will decide on antibiotics based on the culture results.  4.  She still has significant pain in the area. With the continued elevation of the white count, this could indicate further un-drained pus which may require another I and D. We will repeat her CT scan today.   This is a moderately complex infectious disease case. Thank you very much for involving me in Ms. Sciara's care.  ____________________________ Heinz Knuckles. Nhyira Leano, MD meb:sb D: 04/28/2013 13:56:53 ET T: 04/28/2013 14:21:53 ET JOB#: [id number]  cc: Heinz Knuckles. Jamayah Myszka, MD, <Dictator> Marianna Cid E Chigozie Basaldua MD ELECTRONICALLY SIGNED 05/04/2013 13:45

## 2015-03-19 NOTE — Op Note (Signed)
PATIENT NAME:  Lori [name], Lori [name] MR#:  [id number] DATE OF BIRTH:  [birth year]  DATE OF PROCEDURE:  02/17/2014  PREOPERATIVE DIAGNOSES: 1. End-stage renal disease.  2. Superior vena cava stenosis.  3. Nonfunctional PermCath.   POSTOPERATIVE DIAGNOSES:  1. End-stage renal disease.  2. Superior vena cava stenosis.  3. Nonfunctional PermCath.   PROCEDURE: 1. Removal of the existing PermCath with placement of new PermCath through same venous access.  2. Superior venacavogram.  3. Percutaneous transluminal angioplasty superior vena cava with 10 mm diameter angioplasty balloon.   SURGEON: Algernon Huxley, M.D.   ANESTHESIA: Local with moderate conscious sedation.   ESTIMATED BLOOD LOSS: Approximately 50 mL.   FLUOROSCOPY TIME: About four minutes.   CONTRAST USED: 10 mL.   INDICATION FOR PROCEDURE: A 44 year old white female well known to our service for dialysis needs. Her PermCath is not working. We need to exchange this. She has a history of superior vena cava issues as well.   DESCRIPTION OF PROCEDURE: The patient is brought to the vascular suite. The existing catheter, right neck and chest were sterilely prepped and draped and a sterile surgical field was created. I rewired the existing catheter and took a 23 mm diameter DuraMax catheter after taking out the old catheter to replace this.  This would; however, not traverse all the way into the right atrium. Initially imaging was performed. The distal tip was in the right atrium and drained well, but the proximal tip showed large collaterals in the superior vena cava with the stenosis just beyond this point. I then rewired and placed a 12 French sheath over the wire and into the superior vena cava. A 10 mm diameter x 6 mm angioplasty balloon was inflated. A tight waist was taken which resolved at about six atmospheres and there is significantly improved flow through the vena cava. At this point, I was able to easily pass the 23 cm tipped DuraMax  catheter over the wire and into the right atrium with both lumens. Both lumens withdrew blood well and flushed easily with heparinized saline and a concentrated heparin solution was placed. It was secured to the chest wall with 2 Prolene sutures and A 4-0 Monocryl suture was used to pursestring around the exit site. Sterile dressing was placed. The patient tolerated the procedure well and was taken to the recovery room in stable condition.     ____________________________ Algernon Huxley, MD jsd:sg D: 02/17/2014 12:32:38 ET T: 02/17/2014 13:16:37 ET JOB#: [id number]  cc: Algernon Huxley, MD, <Dictator> Algernon Huxley MD ELECTRONICALLY SIGNED 03/01/2014 9:48

## 2015-03-19 NOTE — Consult Note (Signed)
PATIENT NAME:  Lori [name], Lori [name] MR#:  [id number] DATE OF BIRTH:  Jun 06, 1971  DATE OF CONSULTATION:  03/26/2014  REFERRING PHYSICIAN:   CONSULTING PHYSICIAN:  Zander Ingham A. Cesar Alf, MD  REASON FOR CONSULTATION: Left abdominal wound with active bleeding.   HISTORY OF PRESENT ILLNESS: Lori [name] is a pleasant 44 year old with history of hypercalcemia, dialysis, status post thyroid and parathyroidectomy who has a history of large abdominal wound which is being seen by Dr. Jamal Collin as an outpatient. According to her, it is being treated appropriately and not debrided due to the nature of its wound; however, she noticed earlier that it had started bleeding profusely. According the ER nursing and physician staff there was pulsatile blood coming from the wound. A sterile dressing was placed over it. She says that has been going on for a day. Otherwise, has never had a problem with that. Does have another nodule on her abdomen which appears to becoming a calciphylactic nodule. Otherwise, no fevers, chills, night sweats, shortness of breath, cough, chest pain, or other abdominal pain, nausea, vomiting, diarrhea, constipation, dysuria, hematuria. Wound is not actively bleeding at this time.   PAST MEDICAL HISTORY:  1.  End-stage renal disease, status post dialysis through a chest catheter.  2.  Calciphylaxis.  3.  MRSA.  4.  Coronary artery disease.  5.  Migraines.  6.  CVA.  7.  Osteoarthritis.  8.  Asthma.  9.  Anemia.  10.  Thyroid and parathyroidectomy.  11.  Hypertension.  12.  Tubal ligation.  13.  Tonsillectomy.  14.  Previous C-section   MEDICATIONS: Tums, Synthroid, Plavix, PhosLo, Percocet, omeprazole, Nephro-Vite, ibuprofen, EpiPen, Bactrim, Augmentin, and DuoNebs.   ALLERGIES: BEE STINGS, ALTEPLASE AND COUMADIN.  SOCIAL HISTORY: Denies tobacco or alcohol use.   FAMILY HISTORY: Noncontributory.   REVIEW OF SYSTEMS: A 12 point review taken and positive and negatives as above.  PHYSICAL  EXAMINATION: VITAL SIGNS: Temperature not recorded, pulse 65, blood pressure 199/119, respirations 22, 93% on room air.  GENERAL: No acute distress. Alert and oriented x3.  HEAD: Normocephalic, atraumatic.  EYES: No scleral icterus. No conjunctivitis.  FACE: No obvious facial trauma. Normal external nose. Normal external ears. CHEST: Lungs clear to auscultation. Moving air well.  HEART: Regular rate and rhythm. No murmurs, rubs, or gallops. ABDOMEN: Soft. Has an approximately 4 x 4 cm wound with granulated fat as well as an eschar at its lateral aspect. Also has a hard nodule on her abdomen, approximately 3 x 3 cm, which is not broken through the skin.  EXTREMITIES: Moves all extremities well. Strength 5/5. NEUROLOGIC: Cranial nerves II through XII grossly intact. Sensation intact in all 4 extremities.   DIAGNOSTIC DATA: White cell count 10, hemoglobin 10.7, hematocrit 33.1, platelets 332,000. Creatinine 9.54.   Imaging: None.  ASSESSMENT AND PLAN: Lori [name] is a pleasant 44 year old with history of calciphylaxis who presented with bleeding, is no longer bleeding. No obvious area to which could place a stitch. Please call if the patient does begin bleeding and will obtain hemostasis for you.   ____________________________ Glena Norfolk. Jariel Drost, MD cal:sb D: 03/26/2014 15:32:13 ET T: 03/26/2014 16:23:27 ET JOB#: [id number]  cc: Harrell Gave A. Zayn Selley, MD, <Dictator> Floyde Parkins MD ELECTRONICALLY SIGNED 03/27/2014 8:40

## 2015-03-19 NOTE — Op Note (Signed)
PATIENT NAME:  Lori [name], Lori [name] MR#:  [id number] DATE OF BIRTH:  May 28, 1971  DATE OF PROCEDURE:  01/05/2014  PREOPERATIVE DIAGNOSES: 1.  Complication of dialysis device with nonfunction of tunneled catheter.  2.  End-stage renal disease requiring hemodialysis.  3.  Superior vena cava syndrome.  4.  Anxiety disorder.   POSTOPERATIVE DIAGNOSES:  1.  Complication of dialysis device with nonfunction of tunneled catheter.  2.  End-stage renal disease requiring hemodialysis.  3.  Superior vena cava syndrome.  4.  Anxiety disorder.   PROCEDURE PERFORMED: Exchange of a cuffed tunneled dialysis catheter over a wire, same venous access.   PROCEDURE PERFORMED BY:   Katha Cabal, M.D.   DESCRIPTION OF PROCEDURE: The patient is taken to special procedures. She is on a Precedex drip, and is given IV supplemental fentanyl. The existing catheter is prepped, and an Amplatz wire is advanced through the venous port. Attempts at negotiating the wire into the inferior vena cava are unsuccessful. A 19 cm tip-to-cuff  Dura Glide catheter is advanced over the wire. This will not maintain a smooth contour after multiple manipulations, attempting to use both wires, that is to advance wires down both the arterial and venous ports, and negotiate them into the IVC. Ultimately this catheter is abandoned, and a 23 cm tip-to-cuff Palindrome catheter is opened onto the field. This is then advanced over an Amplatz Super Stiff wire, and ultimately is positioned so that it is seated properly with a smooth contour. The tip is just into the atrium as it should be. Both lumens aspirate easily and flush well, and is subsequently packed with 5000 units of heparin and secured to the chest wall with 0 silk. Sterile dressing is applied. The patient tolerated the procedure fine.  Fluoroscopy time was 9.2 minutes. No contrast was used. There were no immediate complications.       ____________________________ Katha Cabal,  MD ggs:mr D: 01/07/2014 17:30:17 ET T: 01/07/2014 20:29:11 ET JOB#: [id number]  cc: Katha Cabal, MD, <Dictator> Katha Cabal MD ELECTRONICALLY SIGNED 01/12/2014 14:26

## 2015-03-19 NOTE — Op Note (Signed)
PATIENT NAME:  Lori [name], Lori [name] MR#:  [id number] DATE OF BIRTH:  06-14-1971  DATE OF PROCEDURE:  01/04/2014  PREOPERATIVE DIAGNOSES:  1.  End-stage renal disease.  2.  Nonfunctional right jugular PermCath.  3.  Obesity.  4.  Hypertension.   POSTOPERATIVE DIAGNOSES:  1.  End-stage renal disease.  2.  Nonfunctional right jugular PermCath.  3.  Obesity.  4.  Hypertension.   PROCEDURE: Removal and replacement through same venous access of right jugular PermCath.   SURGEON: Leotis Pain, M.D.   ANESTHESIA: Local with moderate conscious sedation.   ESTIMATED BLOOD LOSS: Minimal.   INDICATION FOR PROCEDURE: A 44 year old white female with long-standing end-stage renal disease and multiple previous failed dialysis accesses. She has now become catheter-dependent and we are here today to exchange a non-functional PermCath. Risks and benefits were discussed. Informed consent was obtained.   DESCRIPTION OF THE PROCEDURE: The patient is brought to the vascular suite. After an adequate level of intravenous sedation was obtained, the existing catheter in the right chest was sterilely prepped and draped and a sterile surgical field was created. The catheter was rewired with a Amplatz Super Stiff wire and dissected out and removed in its entirety. A 19 cm tip to cuff tunneled hemodialysis catheter was then placed over the wire and the wire was removed. The catheter tip was parked just into the right atrium without a kink and flushed easily with heparinized saline. Both lumens and a concentrated heparin solution was placed. It was secured to the chest wall with 2 Prolene sutures and a Monocryl pursestring suture was placed around the exit site.    ____________________________ Algernon Huxley, MD jsd:aw D: 01/04/2014 11:35:33 ET T: 01/04/2014 13:42:22 ET JOB#: [id number]  cc: Algernon Huxley, MD, <Dictator> Algernon Huxley MD ELECTRONICALLY SIGNED 01/13/2014 10:50

## 2015-03-20 NOTE — Op Note (Signed)
PATIENT NAME:  Lori [name], Lori [name] MR#:  [id number] DATE OF BIRTH:  Dec 12, 1970  DATE OF PROCEDURE:  01/14/2012  PREOPERATIVE DIAGNOSES:  1. End-stage renal disease with functional permanent dialysis access.  2. Coronary artery disease.  3. Stroke.   POSTOPERATIVE DIAGNOSES:  1. End-stage renal disease with functional permanent dialysis access.  2. Coronary artery disease.  3. Stroke.   PROCEDURE: Removal of right jugular PermCath.   SURGEON: Algernon Huxley, M.D.   ANESTHESIA: Local with sedation.   ESTIMATED BLOOD LOSS: Minimal.   INDICATION FOR PROCEDURE: This is a 44 year old white female who now has functional access and her PermCath can be removed.   DESCRIPTION OF PROCEDURE: The patient was brought to the vascular interventional radiology suite. The right neck, chest, and existing catheter were sterilely prepped and draped and a sterile surgical field was created. The area was copiously anesthetized with 1% lidocaine.           The cuff was very near the skin incision. Hemostats were used to help break the fibrous sheath surrounding the cuff and the catheter was then removed in its entirety without difficulty. Pressure was held and a sterile dressing was placed.  ____________________________ Algernon Huxley, MD jsd:slb D: 01/14/2012 13:01:48 ET T: 01/14/2012 13:26:32 ET JOB#: [id number]  cc: Algernon Huxley, MD, <Dictator> Algernon Huxley MD ELECTRONICALLY SIGNED 01/18/2012 13:24

## 2015-11-16 ENCOUNTER — Emergency Department (HOSPITAL_COMMUNITY)
Admission: EM | Admit: 2015-11-16 | Discharge: 2015-11-16 | Disposition: A | Discharge status: Home / Self Care | Payer: Medicare Other | Attending: Emergency Medicine | Admitting: Emergency Medicine

## 2015-11-16 ENCOUNTER — Encounter (HOSPITAL_COMMUNITY): Payer: Self-pay

## 2015-11-16 DIAGNOSIS — Z87448 Personal history of other diseases of urinary system: Secondary | ICD-10-CM | POA: Insufficient documentation

## 2015-11-16 DIAGNOSIS — L03119 Cellulitis of unspecified part of limb: Secondary | ICD-10-CM | POA: Insufficient documentation

## 2015-11-16 DIAGNOSIS — K219 Gastro-esophageal reflux disease without esophagitis: Secondary | ICD-10-CM | POA: Diagnosis not present

## 2015-11-16 DIAGNOSIS — X58XXXA Exposure to other specified factors, initial encounter: Secondary | ICD-10-CM | POA: Diagnosis not present

## 2015-11-16 DIAGNOSIS — Z79899 Other long term (current) drug therapy: Secondary | ICD-10-CM | POA: Insufficient documentation

## 2015-11-16 DIAGNOSIS — S81801A Unspecified open wound, right lower leg, initial encounter: Secondary | ICD-10-CM | POA: Diagnosis not present

## 2015-11-16 DIAGNOSIS — Y939 Activity, unspecified: Secondary | ICD-10-CM | POA: Diagnosis not present

## 2015-11-16 DIAGNOSIS — Z872 Personal history of diseases of the skin and subcutaneous tissue: Secondary | ICD-10-CM | POA: Insufficient documentation

## 2015-11-16 DIAGNOSIS — Y929 Unspecified place or not applicable: Secondary | ICD-10-CM | POA: Diagnosis not present

## 2015-11-16 DIAGNOSIS — Z992 Dependence on renal dialysis: Secondary | ICD-10-CM | POA: Insufficient documentation

## 2015-11-16 DIAGNOSIS — E063 Autoimmune thyroiditis: Secondary | ICD-10-CM | POA: Diagnosis not present

## 2015-11-16 DIAGNOSIS — L539 Erythematous condition, unspecified: Secondary | ICD-10-CM | POA: Diagnosis present

## 2015-11-16 DIAGNOSIS — S81802A Unspecified open wound, left lower leg, initial encounter: Secondary | ICD-10-CM | POA: Diagnosis not present

## 2015-11-16 DIAGNOSIS — Y999 Unspecified external cause status: Secondary | ICD-10-CM | POA: Insufficient documentation

## 2015-11-16 DIAGNOSIS — L0390 Cellulitis, unspecified: Secondary | ICD-10-CM

## 2015-11-16 DIAGNOSIS — I120 Hypertensive chronic kidney disease with stage 5 chronic kidney disease or end stage renal disease: Secondary | ICD-10-CM | POA: Insufficient documentation

## 2015-11-16 DIAGNOSIS — I2510 Atherosclerotic heart disease of native coronary artery without angina pectoris: Secondary | ICD-10-CM | POA: Diagnosis not present

## 2015-11-16 DIAGNOSIS — Z862 Personal history of diseases of the blood and blood-forming organs and certain disorders involving the immune mechanism: Secondary | ICD-10-CM | POA: Insufficient documentation

## 2015-11-16 DIAGNOSIS — N186 End stage renal disease: Secondary | ICD-10-CM | POA: Diagnosis not present

## 2015-11-16 DIAGNOSIS — Z87891 Personal history of nicotine dependence: Secondary | ICD-10-CM | POA: Insufficient documentation

## 2015-11-16 DIAGNOSIS — E213 Hyperparathyroidism, unspecified: Secondary | ICD-10-CM | POA: Diagnosis not present

## 2015-11-16 DIAGNOSIS — Z792 Long term (current) use of antibiotics: Secondary | ICD-10-CM | POA: Diagnosis not present

## 2015-11-16 LAB — COMPREHENSIVE METABOLIC PANEL
ALT: 11 U/L — ABNORMAL LOW (ref 14–54)
AST: 14 U/L — ABNORMAL LOW (ref 15–41)
Albumin: 3.5 g/dL (ref 3.5–5.0)
Alkaline Phosphatase: 60 U/L (ref 38–126)
Anion gap: 13 (ref 5–15)
BUN: 43 mg/dL — ABNORMAL HIGH (ref 6–20)
CO2: 28 mmol/L (ref 22–32)
Calcium: 7.9 mg/dL — ABNORMAL LOW (ref 8.9–10.3)
Chloride: 97 mmol/L — ABNORMAL LOW (ref 101–111)
Creatinine, Ser: 7.31 mg/dL — ABNORMAL HIGH (ref 0.44–1.00)
GFR calc Af Amer: 7 mL/min — ABNORMAL LOW (ref >60)
GFR calc non Af Amer: 6 mL/min — ABNORMAL LOW (ref >60)
Glucose, Bld: 90 mg/dL (ref 65–99)
Potassium: 4 mmol/L (ref 3.5–5.1)
Sodium: 138 mmol/L (ref 135–145)
Total Bilirubin: 0.2 mg/dL — ABNORMAL LOW (ref 0.3–1.2)
Total Protein: 7.2 g/dL (ref 6.5–8.1)

## 2015-11-16 LAB — CBC WITH DIFFERENTIAL/PLATELET
Basophils Absolute: 0.1 10*3/uL (ref 0.0–0.1)
Basophils Relative: 1 %
Eosinophils Absolute: 0.3 10*3/uL (ref 0.0–0.7)
Eosinophils Relative: 3 %
HCT: 30.8 % — ABNORMAL LOW (ref 36.0–46.0)
Hemoglobin: 10 g/dL — ABNORMAL LOW (ref 12.0–15.0)
Lymphocytes Relative: 10 %
Lymphs Abs: 1 10*3/uL (ref 0.7–4.0)
MCH: 30.5 pg (ref 26.0–34.0)
MCHC: 32.5 g/dL (ref 30.0–36.0)
MCV: 93.9 fL (ref 78.0–100.0)
Monocytes Absolute: 1 10*3/uL (ref 0.1–1.0)
Monocytes Relative: 10 %
Neutro Abs: 7.5 10*3/uL (ref 1.7–7.7)
Neutrophils Relative %: 76 %
Platelets: 264 10*3/uL (ref 150–400)
RBC: 3.28 MIL/uL — ABNORMAL LOW (ref 3.87–5.11)
RDW: 14.8 % (ref 11.5–15.5)
WBC: 9.9 10*3/uL (ref 4.0–10.5)

## 2015-11-16 MED ORDER — CLINDAMYCIN HCL 300 MG PO CAPS: 300.0000 mg | ORAL_CAPSULE | Freq: Three times a day (TID) | ORAL | Status: DC

## 2015-11-16 MED ORDER — CLINDAMYCIN HCL 150 MG PO CAPS
300.0000 mg | ORAL_CAPSULE | Freq: Once | ORAL | Status: AC
  Administered 2015-11-16: 300 mg via ORAL
  Filled 2015-11-16: qty 2

## 2015-11-16 MED ORDER — SODIUM CHLORIDE 0.9 % IV SOLN: INTRAVENOUS | Status: DC

## 2015-11-16 NOTE — ED Provider Notes (Signed)
CSN: [id number]     Arrival date & time 11/16/15  1557 History   First MD Initiated Contact with Patient 11/16/15 1630     Chief Complaint  Patient presents with  . Cellulitis     (Consider location/radiation/quality/duration/timing/severity/associated sxs/prior Treatment) HPI   Lori [name] is a 44 y.o. female who presents for evaluation of redness and sores on her legs. She noticed the discomfort several days ago. She has wounds on them which are draining. Yesterday, and dialysis, a nurse suggested that she get some antibiotics ordered. She does not have a PCP, and is unable to get into see her nephrologist for a couple weeks. She denies fever, chills, shortness of breath, weakness or dizziness at this time. There's been no nausea or vomiting. No similar problem in the past. There are no other known modifying factors.   Past Medical History  Diagnosis Date  . ASCVD (arteriosclerotic cardiovascular disease)   . Hypertension   . Anemia   . Hyperparathyroidism   . Hashimoto thyroiditis   . GERD (gastroesophageal reflux disease)   . Morbid obesity ([place])   . Medically noncompliant   . ESRD (end stage renal disease) ([place])     Due to membranous GN dialysis 09/1996; peritoneal dialysis --? peitonitis; difficult vascular access  . Dialysis patient ([place])   . Hypothyroidism   . Calciphylaxis   . Chronic abdominal wound infection [facility])    Past Surgical History  Procedure Laterality Date  . Tonsillectomy and adenoidectomy    . Thyroidectomy, partial      Resectin of left lobe with reimplantation in the forearm, small focus of papillary carcinoma incidentally noted at pathology - 2000 and Hashimoto's thyrdoiditis in 2001  . Btl  1992   Family History  Problem Relation Age of Onset  . Coronary artery disease Mother   . Kidney disease Father   . Diabetes Sister    Social History  Substance Use Topics  . Smoking status: Former Research scientist (life sciences)  . Smokeless tobacco: None  . Alcohol Use: No    OB History    No data available     Review of Systems  All other systems reviewed and are negative.     Allergies  Bee pollen; Activase; and Warfarin sodium  Home Medications   Prior to Admission medications   Medication Sig Start Date End Date Taking? Authorizing Provider  albuterol (PROVENTIL HFA;VENTOLIN HFA) 108 (90 BASE) MCG/ACT inhaler Inhale 2 puffs into the lungs every 6 (six) hours as needed. Shortness of breath    Historical Provider, MD  cephALEXin (KEFLEX) 500 MG capsule Take 1 capsule (500 mg total) by mouth 2 (two) times daily. ANTIBIOTIC TO BE TAKEN FOR 7 MORE DAYS. 03/30/14   Rexene Alberts, MD  levothyroxine (SYNTHROID, LEVOTHROID) 300 MCG tablet Take 300 mcg by mouth at bedtime.      Historical Provider, MD  lidocaine (XYLOCAINE) 4 % external solution Apply topically daily as needed for mild pain. Apply to wound site prior to dressing changes as needed for pain relief. 03/30/14   Rexene Alberts, MD  liothyronine (CYTOMEL) 25 MCG tablet Take 1 tablet (25 mcg total) by mouth daily before breakfast. 03/30/14   Rexene Alberts, MD  multivitamin (RENA-VIT) TABS tablet Take 1 tablet by mouth daily.      Historical Provider, MD  omeprazole (PRILOSEC) 20 MG capsule Take 20 mg by mouth 2 (two) times daily.    Historical Provider, MD  oxyCODONE-acetaminophen (ROXICET) 5-325 MG per tablet Take 1 tablet by  mouth every 6 (six) hours as needed for moderate pain or severe pain. 03/30/14   Rexene Alberts, MD  sevelamer (RENAGEL) 800 MG tablet Take 1,600-3,200 mg by mouth 3 (three) times daily with meals. 4 with meals 2 with snacks    Historical Provider, MD   BP 103/76 mmHg  Pulse 83  Temp(Src) 98.2 F (36.8 C) (Oral)  Resp 22  Ht 5\' 3"  (1.6 m)  Wt 226 lb 3.1 oz (102.6 kg)  BMI 40.08 kg/m2  SpO2 99% Physical Exam  Constitutional: She is oriented to person, place, and time. She appears well-developed and well-nourished.  HENT:  Head: Normocephalic and atraumatic.  Right Ear: External  ear normal.  Left Ear: External ear normal.  Eyes: Conjunctivae and EOM are normal. Pupils are equal, round, and reactive to light.  Neck: Normal range of motion and phonation normal. Neck supple.  Cardiovascular: Normal rate, regular rhythm and normal heart sounds.   Vascular catheter in right upper chest wall, site appears normal. Old fistulas and left arm are nontender.  Pulmonary/Chest: Effort normal and breath sounds normal. She exhibits no bony tenderness.  Abdominal: Soft. There is no tenderness.  Musculoskeletal: Normal range of motion.  Mild pretibial erythema, bilateral lower legs, with some areas of open wounds and drainage. The drainage is clear. The open wounds are superficial. There is no proximal streaking, fluctuance or bleeding.  Neurological: She is alert and oriented to person, place, and time. No cranial nerve deficit or sensory deficit. She exhibits normal muscle tone. Coordination normal.  Skin: Skin is warm, dry and intact.  Psychiatric: She has a normal mood and affect. Her behavior is normal. Judgment and thought content normal.  Nursing note and vitals reviewed.   ED Course  Procedures (including critical care time)  Medications  0.9 %  sodium chloride infusion (not administered)  clindamycin (CLEOCIN) capsule 300 mg (300 mg Oral Given 11/16/15 1803)    Patient Vitals for the past 24 hrs:  BP Temp Temp src Pulse Resp SpO2 Height Weight  11/16/15 1602 103/76 mmHg 98.2 F (36.8 C) Oral 83 22 99 % 5\' 3"  (1.6 m) 226 lb 3.1 oz (102.6 kg)    I discussed with the pharmacist at [facility] regarding the proper dosing for clindamycin for cellulitis.  6:18 PM Reevaluation with update and discussion. After initial assessment and treatment, an updated evaluation reveals no change in clinical status. Findings discussed with patient, all questions answered. [name] L     Labs Review Labs Reviewed  COMPREHENSIVE METABOLIC PANEL  CBC WITH DIFFERENTIAL/PLATELET     Imaging Review No results found. I have personally reviewed and evaluated these images and lab results as part of my medical decision-making.   EKG Interpretation None      MDM   Final diagnoses:  Cellulitis, unspecified cellulitis site, unspecified extremity site, unspecified laterality    Mild leg cellulitis without systemic infection. No areas requiring drainage today.  Nursing Notes Reviewed/ Care Coordinated Applicable Imaging Reviewed Interpretation of Laboratory Data incorporated into ED treatment  The patient appears reasonably screened and/or stabilized for discharge and I doubt any other medical condition or other [place] requiring further screening, evaluation, or treatment in the ED at this time prior to discharge.  Plan: Home Medications- Clindamycin; Home Treatments- soak TID; return here if the recommended treatment, does not improve the symptoms; Recommended follow up- PCP 1 week     Daleen Bo, MD 11/16/15 1819

## 2015-11-16 NOTE — Discharge Instructions (Signed)
Soak your lower legs in warm water. Clean the wounds well with soap and water, each time you soak them. If the wounds are draining, apply a light gauze bandage. See your doctor for checkup in one week.    Cellulitis Cellulitis is an infection of the skin and the tissue beneath it. The infected area is usually red and tender. Cellulitis occurs most often in the arms and lower legs.  CAUSES  Cellulitis is caused by bacteria that enter the skin through cracks or cuts in the skin. The most common types of bacteria that cause cellulitis are staphylococci and streptococci. SIGNS AND SYMPTOMS   Redness and warmth.  Swelling.  Tenderness or pain.  Fever. DIAGNOSIS  Your health care provider can usually determine what is wrong based on a physical exam. Blood tests may also be done. TREATMENT  Treatment usually involves taking an antibiotic medicine. HOME CARE INSTRUCTIONS   Take your antibiotic medicine as directed by your health care provider. Finish the antibiotic even if you start to feel better.  Keep the infected arm or leg elevated to reduce swelling.  Apply a warm cloth to the affected area up to 4 times per day to relieve pain.  Take medicines only as directed by your health care provider.  Keep all follow-up visits as directed by your health care provider. SEEK MEDICAL CARE IF:   You notice red streaks coming from the infected area.  Your red area gets larger or turns dark in color.  Your bone or joint underneath the infected area becomes painful after the skin has healed.  Your infection returns in the same area or another area.  You notice a swollen bump in the infected area.  You develop new symptoms.  You have a fever. SEEK IMMEDIATE MEDICAL CARE IF:   You feel very sleepy.  You develop vomiting or diarrhea.  You have a general ill feeling (malaise) with muscle aches and pains.   This information is not intended to replace advice given to you by your  health care provider. Make sure you discuss any questions you have with your health care provider.   Document Released: 08/22/2005 Document Revised: 08/03/2015 Document Reviewed: 01/28/2012 Elsevier Interactive Patient Education [employer].

## 2015-11-16 NOTE — ED Notes (Signed)
Pt reports redness, swelling, and weeping wounds to bilateral lower legs since last week.

## 2015-11-16 NOTE — ED Notes (Signed)
Pt states she makes very little urine  

## 2015-12-08 DIAGNOSIS — E8779 Other fluid overload: Secondary | ICD-10-CM | POA: Insufficient documentation

## 2016-05-21 DIAGNOSIS — L298 Other pruritus: Secondary | ICD-10-CM | POA: Insufficient documentation

## 2016-05-24 DIAGNOSIS — Z8673 Personal history of transient ischemic attack (TIA), and cerebral infarction without residual deficits: Secondary | ICD-10-CM | POA: Insufficient documentation

## 2016-05-24 DIAGNOSIS — N059 Unspecified nephritic syndrome with unspecified morphologic changes: Secondary | ICD-10-CM | POA: Insufficient documentation

## 2016-05-24 DIAGNOSIS — J383 Other diseases of vocal cords: Secondary | ICD-10-CM | POA: Insufficient documentation

## 2016-05-24 DIAGNOSIS — Z6841 Body Mass Index (BMI) 40.0 and over, adult: Secondary | ICD-10-CM | POA: Insufficient documentation

## 2016-05-24 DIAGNOSIS — Z8709 Personal history of other diseases of the respiratory system: Secondary | ICD-10-CM | POA: Insufficient documentation

## 2016-06-04 DIAGNOSIS — I350 Nonrheumatic aortic (valve) stenosis: Secondary | ICD-10-CM | POA: Insufficient documentation

## 2016-06-12 DIAGNOSIS — I10 Essential (primary) hypertension: Secondary | ICD-10-CM | POA: Insufficient documentation

## 2016-06-12 DIAGNOSIS — E213 Hyperparathyroidism, unspecified: Secondary | ICD-10-CM | POA: Insufficient documentation

## 2016-09-10 ENCOUNTER — Emergency Department (HOSPITAL_COMMUNITY)
Admission: EM | Admit: 2016-09-10 | Discharge: 2016-09-10 | Disposition: A | Discharge status: Home / Self Care | Payer: Medicare Other | Attending: Emergency Medicine | Admitting: Emergency Medicine

## 2016-09-10 ENCOUNTER — Emergency Department (HOSPITAL_COMMUNITY): Payer: Medicare Other

## 2016-09-10 ENCOUNTER — Encounter (HOSPITAL_COMMUNITY): Payer: Self-pay | Admitting: Emergency Medicine

## 2016-09-10 DIAGNOSIS — Y939 Activity, unspecified: Secondary | ICD-10-CM | POA: Insufficient documentation

## 2016-09-10 DIAGNOSIS — S2001XA Contusion of right breast, initial encounter: Secondary | ICD-10-CM

## 2016-09-10 DIAGNOSIS — S20211A Contusion of right front wall of thorax, initial encounter: Secondary | ICD-10-CM

## 2016-09-10 DIAGNOSIS — R0781 Pleurodynia: Secondary | ICD-10-CM | POA: Diagnosis present

## 2016-09-10 DIAGNOSIS — I120 Hypertensive chronic kidney disease with stage 5 chronic kidney disease or end stage renal disease: Secondary | ICD-10-CM | POA: Insufficient documentation

## 2016-09-10 DIAGNOSIS — Z992 Dependence on renal dialysis: Secondary | ICD-10-CM | POA: Diagnosis not present

## 2016-09-10 DIAGNOSIS — Y929 Unspecified place or not applicable: Secondary | ICD-10-CM | POA: Insufficient documentation

## 2016-09-10 DIAGNOSIS — E039 Hypothyroidism, unspecified: Secondary | ICD-10-CM | POA: Insufficient documentation

## 2016-09-10 DIAGNOSIS — Z87891 Personal history of nicotine dependence: Secondary | ICD-10-CM | POA: Diagnosis not present

## 2016-09-10 DIAGNOSIS — Z79899 Other long term (current) drug therapy: Secondary | ICD-10-CM | POA: Insufficient documentation

## 2016-09-10 DIAGNOSIS — W01198A Fall on same level from slipping, tripping and stumbling with subsequent striking against other object, initial encounter: Secondary | ICD-10-CM | POA: Diagnosis not present

## 2016-09-10 DIAGNOSIS — Y999 Unspecified external cause status: Secondary | ICD-10-CM | POA: Insufficient documentation

## 2016-09-10 DIAGNOSIS — N186 End stage renal disease: Secondary | ICD-10-CM | POA: Insufficient documentation

## 2016-09-10 MED ORDER — HYDROCODONE-ACETAMINOPHEN 5-325 MG PO TABS: 1.0000 | ORAL_TABLET | Freq: Four times a day (QID) | ORAL | 0 refills | Status: DC | PRN

## 2016-09-10 NOTE — ED Provider Notes (Signed)
[place] DEPT Provider Note   CSN: [id number] Arrival date & time: 09/10/16  1200  By signing my name below, I, Evelene Croon, attest that this documentation has been prepared under the direction and in the presence of Julianne Rice, MD . Electronically Signed: Evelene Croon, Scribe. 09/10/2016. 1:35 PM.   History   Chief Complaint Chief Complaint  Patient presents with  . Fall   The history is provided by the patient. No language interpreter was used.     HPI Comments:  Lori [name] is a 45 y.o. female who presents to the Emergency Department s/p fall yesterday complaining of gradually worsening right rib pain following the incident. She rates her pain a 8/10. Pt tripped and fell and landed  on the floor injuring her chest. She notes landed on her dialysis catheter which is in her right chest. She denies LOC. She has been taking ibuprofen with mild relief. No fever or chills. No shortness of breath. Pt receives dialysis on MWF. States she's wanted dialysis after leaving the emergency department.  Past Medical History:  Diagnosis Date  . Anemia   . ASCVD (arteriosclerotic cardiovascular disease)   . Calciphylaxis   . Chronic abdominal wound infection   . Dialysis patient ([place])   . ESRD (end stage renal disease) ([place])    Due to membranous GN dialysis 09/1996; peritoneal dialysis --? peitonitis; difficult vascular access  . GERD (gastroesophageal reflux disease)   . Hashimoto thyroiditis   . Hyperparathyroidism   . Hypertension   . Hypothyroidism   . Medically noncompliant   . Morbid obesity [facility])     Patient Active Problem List   Diagnosis Date Noted  . Bleeding from wound 03/27/2014  . Hemorrhagic shock 03/27/2014  . Hypocalcemia 03/27/2014  . ESRD on dialysis ([place]) 03/27/2014  . Acute blood loss anemia 03/27/2014  . Hypothyroidism 03/27/2014  . Chronic abdominal wound infection 03/27/2014  . Calciphylaxis 03/27/2014  . Cellulitis 08/11/2012  .  ATHEROSCLEROTIC CARDIOVASCULAR DISEASE 09/22/2010  . SYNCOPE 07/16/2010  . POSTURAL LIGHTHEADEDNESS 07/16/2010  . OBESITY 07/14/2010  . Anemia of other chronic disease 07/14/2010  . GASTROESOPHAGEAL REFLUX DISEASE 07/14/2010  . RENAL FAILURE, END STAGE 07/14/2010  . PALPITATIONS 07/14/2010  . HYPERPARATHYROIDISM, HX OF 07/14/2010    Past Surgical History:  Procedure Laterality Date  . [place]  . THYROIDECTOMY, PARTIAL     Resectin of left lobe with reimplantation in the forearm, small focus of papillary carcinoma incidentally noted at pathology - 2000 and Hashimoto's thyrdoiditis in 2001  . TONSILLECTOMY AND ADENOIDECTOMY      OB History    Gravida Para Term Preterm AB Living   1 1   1        SAB TAB Ectopic Multiple Live Births                   Home Medications    Prior to Admission medications   Medication Sig Start Date End Date Taking? Authorizing Provider  albuterol (PROVENTIL HFA;VENTOLIN HFA) 108 (90 BASE) MCG/ACT inhaler Inhale 2 puffs into the lungs every 6 (six) hours as needed. Shortness of breath   Yes Historical Provider, MD  b complex-vitamin c-folic acid (NEPHRO-VITE) 0.8 MG TABS tablet Take 1 tablet by mouth daily.   Yes Historical Provider, MD  levothyroxine (SYNTHROID, LEVOTHROID) 300 MCG tablet Take 300 mcg by mouth at bedtime.     Yes Historical Provider, MD  sevelamer carbonate (RENVELA) 800 MG tablet Take 1,600-3,200 mg by mouth 3 (three)  times daily with meals. 4 with meals and 3 with snacks   Yes Historical Provider, MD  HYDROcodone-acetaminophen (NORCO) 5-325 MG tablet Take 1 tablet by mouth every 6 (six) hours as needed for severe pain. 09/10/16   Julianne Rice, MD    Family History Family History  Problem Relation Age of Onset  . Coronary artery disease Mother   . Kidney disease Father   . Diabetes Sister     Social History Social History  Substance Use Topics  . Smoking status: Former Research scientist (life sciences)  . Smokeless tobacco: Never Used  .  Alcohol use No     Allergies   Bee pollen; Activase [alteplase]; and Warfarin sodium   Review of Systems Review of Systems  Constitutional: Negative for chills and fever.  Respiratory: Negative for cough and shortness of breath.   Cardiovascular: Positive for chest pain (Rib Pain). Negative for palpitations and leg swelling.  Gastrointestinal: Negative for abdominal pain, diarrhea, nausea and vomiting.  Genitourinary: Negative for flank pain.  Musculoskeletal: Positive for back pain. Negative for neck pain and neck stiffness.  Skin: Positive for wound. Negative for rash.  Neurological: Negative for dizziness, syncope, weakness and light-headedness.  All other systems reviewed and are negative.    Physical Exam Updated Vital Signs BP (!) 193/101 (BP Location: Left Arm)   Pulse 61   Temp 97.8 F (36.6 C) (Oral)   Resp 24   Ht 5\' 4"  (1.626 m)   Wt 228 lb 2.8 oz (103.5 kg)   SpO2 99%   BMI 39.17 kg/m   Physical Exam  Constitutional: She is oriented to person, place, and time. She appears well-developed and well-nourished. No distress.  HENT:  Head: Normocephalic and atraumatic.  Mouth/Throat: Oropharynx is clear and moist.  Eyes: EOM are normal. Pupils are equal, round, and reactive to light.  Neck: Normal range of motion. Neck supple. No JVD present.  Cardiovascular: Normal rate and regular rhythm.  Exam reveals no gallop and no friction rub.   No murmur heard. Pulmonary/Chest: Effort normal and breath sounds normal. No respiratory distress. She has no wheezes. She has no rales. She exhibits tenderness.    Abdominal: Soft. Bowel sounds are normal. There is no tenderness. There is no rebound and no guarding.  Musculoskeletal: Normal range of motion. She exhibits no edema or tenderness.  Patient has mild right posterior/inferior rib pain to palpation. There is no crepitance or deformity.  Neurological: She is alert and oriented to person, place, and time.  Moving all  extremities without deficit. Ambulating without assistance.  Skin: Skin is warm and dry. Capillary refill takes less than 2 seconds. No rash noted. No erythema.  Psychiatric: She has a normal mood and affect. Her behavior is normal.  Nursing note and vitals reviewed.    ED Treatments / Results  DIAGNOSTIC STUDIES:  Oxygen Saturation is 99% on RA, normal by my interpretation.    COORDINATION OF CARE:  1:30 PM Discussed treatment plan with pt at bedside and pt agreed to plan.  Labs (all labs ordered are listed, but only abnormal results are displayed) Labs Reviewed - No data to display  EKG  EKG Interpretation None       Radiology Dg Ribs Unilateral W/chest Right  Result Date: 09/10/2016 CLINICAL DATA:  Right anterior rib pain after fall EXAM: RIGHT RIBS AND CHEST - 3+ VIEW COMPARISON:  None. FINDINGS: Right chest wall dialysis catheter is noted with tips in the right atrium. There is moderate cardiac enlargement. No pleural  effusion or edema. No airspace opacities. No displaced rib fractures. IMPRESSION: 1. No displaced rib fractures identified. Electronically Signed   By: Kerby Moors M.D.   On: 09/10/2016 13:23    Procedures Procedures (including critical care time)  Medications Ordered in ED Medications - No data to display   Initial Impression / Assessment and Plan / ED Course  I have reviewed the triage vital signs and the nursing notes.  Pertinent labs & imaging results that were available during my care of the patient were reviewed by me and considered in my medical decision making (see chart for details).  Clinical Course    X-ray without evidence of fracture or intrathoracic injury. We'll treat for rib and breast contusion. Patient is to follow-up with her dialysis clinic for hemodialysis today. She's been given return precautions and is voiced understanding.  Final Clinical Impressions(s) / ED Diagnoses   Final diagnoses:  Contusion of right breast,  initial encounter  Rib contusion, right, initial encounter    New Prescriptions New Prescriptions   HYDROCODONE-ACETAMINOPHEN (NORCO) 5-325 MG TABLET    Take 1 tablet by mouth every 6 (six) hours as needed for severe pain.   I personally performed the services described in this documentation, which was scribed in my presence. The recorded information has been reviewed and is accurate.       Julianne Rice, MD 09/10/16 1343

## 2016-09-10 NOTE — ED Notes (Signed)
Pt returned from X-ray.  

## 2016-09-10 NOTE — ED Notes (Signed)
Patient transported to X-ray 

## 2016-09-10 NOTE — ED Triage Notes (Signed)
Pt reports falling and hitting her R ribs and chest.

## 2016-10-01 ENCOUNTER — Other Ambulatory Visit (HOSPITAL_COMMUNITY)
Admission: RE | Admit: 2016-10-01 | Discharge: 2016-10-01 | Disposition: A | Discharge status: Home / Self Care | Payer: Medicare Other | Source: Other Acute Inpatient Hospital | Attending: Family Medicine | Admitting: Family Medicine

## 2016-10-01 DIAGNOSIS — N2589 Other disorders resulting from impaired renal tubular function: Secondary | ICD-10-CM | POA: Insufficient documentation

## 2016-10-01 DIAGNOSIS — E875 Hyperkalemia: Secondary | ICD-10-CM | POA: Diagnosis present

## 2016-10-01 LAB — POTASSIUM: Potassium: 5.5 mmol/L — ABNORMAL HIGH (ref 3.5–5.1)

## 2017-01-03 DIAGNOSIS — R460 Very low level of personal hygiene: Secondary | ICD-10-CM | POA: Insufficient documentation

## 2017-01-04 DIAGNOSIS — R2689 Other abnormalities of gait and mobility: Secondary | ICD-10-CM | POA: Insufficient documentation

## 2018-02-09 ENCOUNTER — Emergency Department (HOSPITAL_COMMUNITY): Payer: Medicare Other

## 2018-02-09 ENCOUNTER — Other Ambulatory Visit: Payer: Self-pay

## 2018-02-09 ENCOUNTER — Encounter (HOSPITAL_COMMUNITY): Payer: Self-pay | Admitting: Emergency Medicine

## 2018-02-09 ENCOUNTER — Inpatient Hospital Stay (HOSPITAL_COMMUNITY)
Admission: EM | Admit: 2018-02-09 | Discharge: 2018-02-14 | DRG: 246 | Disposition: A | Discharge status: Home / Self Care | Payer: Medicare Other | Attending: Cardiology | Admitting: Cardiology

## 2018-02-09 DIAGNOSIS — I214 Non-ST elevation (NSTEMI) myocardial infarction: Principal | ICD-10-CM | POA: Diagnosis present

## 2018-02-09 DIAGNOSIS — Z87441 Personal history of nephrotic syndrome: Secondary | ICD-10-CM | POA: Diagnosis not present

## 2018-02-09 DIAGNOSIS — D631 Anemia in chronic kidney disease: Secondary | ICD-10-CM | POA: Diagnosis present

## 2018-02-09 DIAGNOSIS — Z87891 Personal history of nicotine dependence: Secondary | ICD-10-CM | POA: Diagnosis not present

## 2018-02-09 DIAGNOSIS — T827XXA Infection and inflammatory reaction due to other cardiac and vascular devices, implants and grafts, initial encounter: Secondary | ICD-10-CM | POA: Diagnosis not present

## 2018-02-09 DIAGNOSIS — Z79899 Other long term (current) drug therapy: Secondary | ICD-10-CM | POA: Diagnosis not present

## 2018-02-09 DIAGNOSIS — Y841 Kidney dialysis as the cause of abnormal reaction of the patient, or of later complication, without mention of misadventure at the time of the procedure: Secondary | ICD-10-CM | POA: Diagnosis present

## 2018-02-09 DIAGNOSIS — I255 Ischemic cardiomyopathy: Secondary | ICD-10-CM | POA: Diagnosis present

## 2018-02-09 DIAGNOSIS — Z6841 Body Mass Index (BMI) 40.0 and over, adult: Secondary | ICD-10-CM

## 2018-02-09 DIAGNOSIS — A419 Sepsis, unspecified organism: Secondary | ICD-10-CM | POA: Diagnosis not present

## 2018-02-09 DIAGNOSIS — I2510 Atherosclerotic heart disease of native coronary artery without angina pectoris: Secondary | ICD-10-CM | POA: Diagnosis present

## 2018-02-09 DIAGNOSIS — E785 Hyperlipidemia, unspecified: Secondary | ICD-10-CM | POA: Diagnosis present

## 2018-02-09 DIAGNOSIS — E063 Autoimmune thyroiditis: Secondary | ICD-10-CM | POA: Diagnosis present

## 2018-02-09 DIAGNOSIS — I120 Hypertensive chronic kidney disease with stage 5 chronic kidney disease or end stage renal disease: Secondary | ICD-10-CM | POA: Diagnosis present

## 2018-02-09 DIAGNOSIS — N186 End stage renal disease: Secondary | ICD-10-CM | POA: Diagnosis present

## 2018-02-09 DIAGNOSIS — E039 Hypothyroidism, unspecified: Secondary | ICD-10-CM | POA: Diagnosis present

## 2018-02-09 DIAGNOSIS — K219 Gastro-esophageal reflux disease without esophagitis: Secondary | ICD-10-CM | POA: Diagnosis present

## 2018-02-09 DIAGNOSIS — N2581 Secondary hyperparathyroidism of renal origin: Secondary | ICD-10-CM | POA: Diagnosis present

## 2018-02-09 DIAGNOSIS — Z992 Dependence on renal dialysis: Secondary | ICD-10-CM

## 2018-02-09 DIAGNOSIS — I447 Left bundle-branch block, unspecified: Secondary | ICD-10-CM | POA: Diagnosis present

## 2018-02-09 DIAGNOSIS — Z955 Presence of coronary angioplasty implant and graft: Secondary | ICD-10-CM | POA: Diagnosis not present

## 2018-02-09 LAB — I-STAT TROPONIN, ED: Troponin i, poc: 1.22 ng/mL (ref 0.00–0.08)

## 2018-02-09 LAB — MRSA PCR SCREENING: MRSA by PCR: NEGATIVE

## 2018-02-09 LAB — CBC WITH DIFFERENTIAL/PLATELET
Basophils Absolute: 0.1 10*3/uL (ref 0.0–0.1)
Basophils Absolute: 0.1 10*3/uL (ref 0.0–0.1)
Basophils Relative: 1 %
Basophils Relative: 1 %
Eosinophils Absolute: 0.4 10*3/uL (ref 0.0–0.7)
Eosinophils Absolute: 0.3 10*3/uL (ref 0.0–0.7)
Eosinophils Relative: 5 %
Eosinophils Relative: 3 %
HCT: 36.9 % (ref 36.0–46.0)
HCT: 32.9 % — ABNORMAL LOW (ref 36.0–46.0)
Hemoglobin: 11.6 g/dL — ABNORMAL LOW (ref 12.0–15.0)
Hemoglobin: 10.6 g/dL — ABNORMAL LOW (ref 12.0–15.0)
Lymphocytes Relative: 14 %
Lymphocytes Relative: 12 %
Lymphs Abs: 1.1 10*3/uL (ref 0.7–4.0)
Lymphs Abs: 1.1 10*3/uL (ref 0.7–4.0)
MCH: 30.7 pg (ref 26.0–34.0)
MCH: 31.2 pg (ref 26.0–34.0)
MCHC: 31.4 g/dL (ref 30.0–36.0)
MCHC: 32.2 g/dL (ref 30.0–36.0)
MCV: 97.6 fL (ref 78.0–100.0)
MCV: 96.8 fL (ref 78.0–100.0)
Monocytes Absolute: 1.1 10*3/uL — ABNORMAL HIGH (ref 0.1–1.0)
Monocytes Absolute: 0.7 10*3/uL (ref 0.1–1.0)
Monocytes Relative: 14 %
Monocytes Relative: 8 %
Neutro Abs: 5.5 10*3/uL (ref 1.7–7.7)
Neutro Abs: 6.6 10*3/uL (ref 1.7–7.7)
Neutrophils Relative %: 66 %
Neutrophils Relative %: 76 %
Platelets: 233 10*3/uL (ref 150–400)
Platelets: 198 10*3/uL (ref 150–400)
RBC: 3.78 MIL/uL — ABNORMAL LOW (ref 3.87–5.11)
RBC: 3.4 MIL/uL — ABNORMAL LOW (ref 3.87–5.11)
RDW: 14.8 % (ref 11.5–15.5)
RDW: 15.1 % (ref 11.5–15.5)
WBC: 8.3 10*3/uL (ref 4.0–10.5)
WBC: 8.7 10*3/uL (ref 4.0–10.5)

## 2018-02-09 LAB — COMPREHENSIVE METABOLIC PANEL
ALT: 11 U/L — ABNORMAL LOW (ref 14–54)
AST: 19 U/L (ref 15–41)
Albumin: 3.6 g/dL (ref 3.5–5.0)
Alkaline Phosphatase: 47 U/L (ref 38–126)
Anion gap: 15 (ref 5–15)
BUN: 46 mg/dL — ABNORMAL HIGH (ref 6–20)
CO2: 24 mmol/L (ref 22–32)
Calcium: 7.9 mg/dL — ABNORMAL LOW (ref 8.9–10.3)
Chloride: 95 mmol/L — ABNORMAL LOW (ref 101–111)
Creatinine, Ser: 9.24 mg/dL — ABNORMAL HIGH (ref 0.44–1.00)
GFR calc Af Amer: 5 mL/min — ABNORMAL LOW (ref >60)
GFR calc non Af Amer: 4 mL/min — ABNORMAL LOW (ref >60)
Glucose, Bld: 87 mg/dL (ref 65–99)
Potassium: 5.1 mmol/L (ref 3.5–5.1)
Sodium: 134 mmol/L — ABNORMAL LOW (ref 135–145)
Total Bilirubin: 0.7 mg/dL (ref 0.3–1.2)
Total Protein: 6.7 g/dL (ref 6.5–8.1)

## 2018-02-09 LAB — BASIC METABOLIC PANEL
Anion gap: 17 — ABNORMAL HIGH (ref 5–15)
BUN: 46 mg/dL — ABNORMAL HIGH (ref 6–20)
CO2: 24 mmol/L (ref 22–32)
Calcium: 8.4 mg/dL — ABNORMAL LOW (ref 8.9–10.3)
Chloride: 95 mmol/L — ABNORMAL LOW (ref 101–111)
Creatinine, Ser: 8.77 mg/dL — ABNORMAL HIGH (ref 0.44–1.00)
GFR calc Af Amer: 6 mL/min — ABNORMAL LOW (ref >60)
GFR calc non Af Amer: 5 mL/min — ABNORMAL LOW (ref >60)
Glucose, Bld: 90 mg/dL (ref 65–99)
Potassium: 5.3 mmol/L — ABNORMAL HIGH (ref 3.5–5.1)
Sodium: 136 mmol/L (ref 135–145)

## 2018-02-09 LAB — TROPONIN I
Troponin I: 2.59 ng/mL (ref <0.03)
Troponin I: 3.03 ng/mL (ref <0.03)
Troponin I: 4.82 ng/mL (ref <0.03)

## 2018-02-09 LAB — APTT: aPTT: 32 seconds (ref 24–36)

## 2018-02-09 LAB — PROTIME-INR
INR: 1.13
Prothrombin Time: 14.4 seconds (ref 11.4–15.2)

## 2018-02-09 LAB — TSH: TSH: 46.838 u[IU]/mL — ABNORMAL HIGH (ref 0.350–4.500)

## 2018-02-09 LAB — HEPARIN LEVEL (UNFRACTIONATED): Heparin Unfractionated: <0.1 IU/mL — ABNORMAL LOW (ref 0.30–0.70)

## 2018-02-09 LAB — MAGNESIUM: Magnesium: 1.8 mg/dL (ref 1.7–2.4)

## 2018-02-09 MED ORDER — HEPARIN BOLUS VIA INFUSION
4000.0000 [IU] | Freq: Once | INTRAVENOUS | Status: AC
  Administered 2018-02-09: 4000 [IU] via INTRAVENOUS

## 2018-02-09 MED ORDER — ASPIRIN EC 81 MG PO TBEC
81.0000 mg | DELAYED_RELEASE_TABLET | Freq: Every day | ORAL | Status: DC
  Administered 2018-02-10 – 2018-02-14 (×4): 81 mg via ORAL
  Filled 2018-02-09 (×5): qty 1

## 2018-02-09 MED ORDER — IOPAMIDOL (ISOVUE-370) INJECTION 76%
100.0000 mL | Freq: Once | INTRAVENOUS | Status: AC | PRN
  Administered 2018-02-09: 100 mL via INTRAVENOUS

## 2018-02-09 MED ORDER — METOPROLOL TARTRATE 12.5 MG HALF TABLET
12.5000 mg | ORAL_TABLET | Freq: Two times a day (BID) | ORAL | Status: DC
  Administered 2018-02-09 – 2018-02-13 (×7): 12.5 mg via ORAL
  Filled 2018-02-09 (×7): qty 1

## 2018-02-09 MED ORDER — LEVOTHYROXINE SODIUM 100 MCG PO TABS
300.0000 ug | ORAL_TABLET | Freq: Every day | ORAL | Status: DC
  Administered 2018-02-09 – 2018-02-14 (×6): 300 ug via ORAL
  Filled 2018-02-09 (×6): qty 3

## 2018-02-09 MED ORDER — SEVELAMER CARBONATE 800 MG PO TABS: 1600.0000 mg | ORAL_TABLET | Freq: Three times a day (TID) | ORAL | Status: DC

## 2018-02-09 MED ORDER — SEVELAMER CARBONATE 800 MG PO TABS
2400.0000 mg | ORAL_TABLET | ORAL | Status: DC
  Administered 2018-02-10 – 2018-02-14 (×5): 2400 mg via ORAL
  Filled 2018-02-09 (×2): qty 3

## 2018-02-09 MED ORDER — CEFAZOLIN SODIUM-DEXTROSE 1-4 GM/50ML-% IV SOLN
1.0000 g | INTRAVENOUS | Status: DC
  Filled 2018-02-09 (×2): qty 50

## 2018-02-09 MED ORDER — SEVELAMER CARBONATE 800 MG PO TABS
3200.0000 mg | ORAL_TABLET | Freq: Three times a day (TID) | ORAL | Status: DC
  Administered 2018-02-09 – 2018-02-14 (×10): 3200 mg via ORAL
  Filled 2018-02-09 (×13): qty 4

## 2018-02-09 MED ORDER — ALBUTEROL SULFATE (2.5 MG/3ML) 0.083% IN NEBU
2.5000 mg | INHALATION_SOLUTION | Freq: Four times a day (QID) | RESPIRATORY_TRACT | Status: DC | PRN
  Filled 2018-02-09: qty 3

## 2018-02-09 MED ORDER — VITAMIN D (ERGOCALCIFEROL) 1.25 MG (50000 UNIT) PO CAPS
50000.0000 [IU] | ORAL_CAPSULE | ORAL | Status: DC
Start: 2018-02-10 — End: 2018-02-15
  Filled 2018-02-09: qty 1

## 2018-02-09 MED ORDER — NITROGLYCERIN IN D5W 200-5 MCG/ML-% IV SOLN
0.0000 ug/min | INTRAVENOUS | Status: DC
  Administered 2018-02-11 – 2018-02-12 (×2): 5 ug/min via INTRAVENOUS
  Filled 2018-02-09 (×2): qty 250

## 2018-02-09 MED ORDER — ONDANSETRON HCL 4 MG/2ML IJ SOLN: 4.0000 mg | Freq: Four times a day (QID) | INTRAMUSCULAR | Status: DC | PRN

## 2018-02-09 MED ORDER — HEPARIN BOLUS VIA INFUSION
2000.0000 [IU] | Freq: Once | INTRAVENOUS | Status: AC
  Administered 2018-02-09: 2000 [IU] via INTRAVENOUS
  Filled 2018-02-09: qty 2000

## 2018-02-09 MED ORDER — SODIUM CHLORIDE 0.9 % IV SOLN: INTRAVENOUS | Status: DC

## 2018-02-09 MED ORDER — PANTOPRAZOLE SODIUM 40 MG PO TBEC
40.0000 mg | DELAYED_RELEASE_TABLET | Freq: Every day | ORAL | Status: DC
  Administered 2018-02-09 – 2018-02-14 (×5): 40 mg via ORAL
  Filled 2018-02-09 (×5): qty 1

## 2018-02-09 MED ORDER — ASPIRIN 81 MG PO CHEW
324.0000 mg | CHEWABLE_TABLET | Freq: Once | ORAL | Status: AC
  Administered 2018-02-09: 324 mg via ORAL
  Filled 2018-02-09: qty 4

## 2018-02-09 MED ORDER — NITROGLYCERIN IN D5W 200-5 MCG/ML-% IV SOLN
INTRAVENOUS | Status: AC
  Filled 2018-02-09: qty 250

## 2018-02-09 MED ORDER — NITROGLYCERIN 0.4 MG SL SUBL: 0.4000 mg | SUBLINGUAL_TABLET | SUBLINGUAL | Status: DC | PRN

## 2018-02-09 MED ORDER — HEPARIN (PORCINE) IN NACL 100-0.45 UNIT/ML-% IJ SOLN
1600.0000 [IU]/h | INTRAMUSCULAR | Status: DC
  Administered 2018-02-09: 1250 [IU]/h via INTRAVENOUS
  Administered 2018-02-09: 1000 [IU]/h via INTRAVENOUS
  Administered 2018-02-10: 1450 [IU]/h via INTRAVENOUS
  Filled 2018-02-09 (×3): qty 250

## 2018-02-09 MED ORDER — NITROGLYCERIN 0.4 MG SL SUBL
0.4000 mg | SUBLINGUAL_TABLET | SUBLINGUAL | Status: AC | PRN
  Administered 2018-02-09 (×3): 0.4 mg via SUBLINGUAL
  Filled 2018-02-09: qty 1

## 2018-02-09 MED ORDER — FENTANYL CITRATE (PF) 100 MCG/2ML IJ SOLN
100.0000 ug | Freq: Once | INTRAMUSCULAR | Status: AC
Start: 2018-02-09 — End: 2018-02-09
  Administered 2018-02-09: 100 ug via INTRAVENOUS
  Filled 2018-02-09: qty 2

## 2018-02-09 MED ORDER — CLOPIDOGREL BISULFATE 300 MG PO TABS
300.0000 mg | ORAL_TABLET | Freq: Once | ORAL | Status: AC
  Administered 2018-02-09: 300 mg via ORAL
  Filled 2018-02-09: qty 1

## 2018-02-09 MED ORDER — TRAMADOL HCL 50 MG PO TABS
50.0000 mg | ORAL_TABLET | Freq: Three times a day (TID) | ORAL | Status: DC | PRN
  Administered 2018-02-09 – 2018-02-14 (×9): 50 mg via ORAL
  Filled 2018-02-09 (×9): qty 1

## 2018-02-09 MED ORDER — ATORVASTATIN CALCIUM 80 MG PO TABS
80.0000 mg | ORAL_TABLET | Freq: Every day | ORAL | Status: DC
  Administered 2018-02-09 – 2018-02-13 (×5): 80 mg via ORAL
  Filled 2018-02-09 (×3): qty 1
  Filled 2018-02-09: qty 2
  Filled 2018-02-09: qty 1

## 2018-02-09 MED ORDER — CLOPIDOGREL BISULFATE 75 MG PO TABS
75.0000 mg | ORAL_TABLET | Freq: Every day | ORAL | Status: DC
  Administered 2018-02-10 – 2018-02-13 (×4): 75 mg via ORAL
  Filled 2018-02-09 (×4): qty 1

## 2018-02-09 MED ORDER — RENA-VITE PO TABS
1.0000 | ORAL_TABLET | Freq: Every day | ORAL | Status: DC
  Administered 2018-02-09 – 2018-02-14 (×6): 1 via ORAL
  Filled 2018-02-09 (×6): qty 1

## 2018-02-09 NOTE — Progress Notes (Addendum)
Upon admission patient has HD catheter on Right side of chest. Catheter had only a piece of gauze and tape covering insertion site. Exposed catheter tubing covered in thick, sticky, dark brown residue. Insertion site has dark scab or dried drainage under the catheter. Patient stated that we could not use CHG to clean the site because it causes itching and a rash. Patient states that dialysis center uses betadine. Consulted with IV team and Dr. Moshe Cipro and they agreed that we could use betadine. Changed dressing with Berniece Salines RN and we attempted to clean the catheter. Residue will not come off. MD Moshe Cipro, MD Terrence Dupont and Juanell Fairly PA assessed at bedside.

## 2018-02-09 NOTE — Progress Notes (Signed)
ANTICOAGULATION CONSULT NOTE - Initial Consult  Pharmacy Consult for heparin Indication: chest pain/ACS/STEMI  Allergies  Allergen Reactions  . Bee Pollen Anaphylaxis  . Activase [Alteplase] Other (See Comments)    dyspnea  . Warfarin Sodium Nausea And Vomiting and Rash    Patient Measurements: Height: 5\' 3"  (160 cm) Weight: 241 lb 13.5 oz (109.7 kg) IBW/kg (Calculated) : 52.4 Heparin Dosing Weight: 77.7 kg  Vital Signs: Temp: 97.7 F (36.5 C) (03/17 1557) Temp Source: Oral (03/17 1557) BP: 118/82 (03/17 1400) Pulse Rate: 63 (03/17 1500)  Labs: Recent Labs    02/09/18 0437 02/09/18 0733 02/09/18 1232 02/09/18 1657  HGB 11.6*  --  10.6*  --   HCT 36.9  --  32.9*  --   PLT 233  --  198  --   APTT  --  32  --   --   LABPROT  --  14.4  --   --   INR  --  1.13  --   --   HEPARINUNFRC  --   --   --  <0.10*  CREATININE 8.77*  --  9.24*  --   TROPONINI 2.59*  --  3.03* 4.82*    Estimated Creatinine Clearance: 8.9 mL/min (A) (by C-G formula based on SCr of 9.24 mg/dL (H)).   Assessment: 59 YOF transferred from [facility] on heparin for ACS, possible cath Tuesday Heparin level undetectable on 1000 units/hr   Goal of Therapy:  Heparin level 0.3-0.7 units/ml Monitor platelets by anticoagulation protocol: Yes   Plan:  rebolus 2000 units bolus x 1 Increase heparin infusion to 1250 units/hr Recheck heparin level at 0300 Continue to monitor H&H and platelets  Maryanna Shape, PharmD, BCPS  Clinical Pharmacist  Pager: [phone]   02/09/2018 6:51 PM

## 2018-02-09 NOTE — ED Triage Notes (Signed)
Pt c/o left sided chest pain since Thursday with sob. Pt states last dialysis was Friday.

## 2018-02-09 NOTE — H&P (Signed)
Lori [name] is an 47 y.o. female.   Chief Complaint: retrosternal and left-sided chest pain associated with shortness of breath HPI: patient is 47 year old female with past medical history significant for coronary artery disease status post PCI many years ago, hypertension, history of nephrotic syndrome, end-stage renal disease on hemodialysis for last 20+ years, morbid obesity, GERD, hypothyroidism, history of Hashimoto thyroiditis, was transferred from [facility] ER for further management. Patient complained of retrosternal and left-sided chest pain radiating to left axillary region described as dull achingpressureoff and on since Thursday did not seek medical attention initially but as pain got worst this a.m. So decided to go to ER. Patient received sublingual nitroglycerin and was started on heparin with partial relief of chest pain patient was noted to have elevated troponin I EKG done in the ED showed normal sinus rhythm with left bundle branch block. Troponin I repeat is trending down   Past Medical History:  Diagnosis Date  . Anemia   . ASCVD (arteriosclerotic cardiovascular disease)   . Calciphylaxis   . Chronic abdominal wound infection   . Dialysis patient ([place])   . ESRD (end stage renal disease) ([place])    Due to membranous GN dialysis 09/1996; peritoneal dialysis --? peitonitis; difficult vascular access  . GERD (gastroesophageal reflux disease)   . Hashimoto thyroiditis   . Hyperparathyroidism   . Hypertension   . Hypothyroidism   . Medically noncompliant   . Morbid obesity ([place])     Past Surgical History:  Procedure Laterality Date  . [place]  . THYROIDECTOMY, PARTIAL     Resectin of left lobe with reimplantation in the forearm, small focus of papillary carcinoma incidentally noted at pathology - 2000 and Hashimoto's thyrdoiditis in 2001  . TONSILLECTOMY AND ADENOIDECTOMY      Family History  Problem Relation Age of Onset  . Coronary artery disease Mother    . Kidney disease Father   . Diabetes Sister    Social History:  reports that she has quit smoking. she has never used smokeless tobacco. She reports that she does not drink alcohol or use drugs.  Allergies:  Allergies  Allergen Reactions  . Bee Pollen Anaphylaxis  . Activase [Alteplase] Other (See Comments)    dyspnea  . Warfarin Sodium Nausea And Vomiting and Rash    Medications Prior to Admission  Medication Sig Dispense Refill  . albuterol (PROVENTIL HFA;VENTOLIN HFA) 108 (90 BASE) MCG/ACT inhaler Inhale 2 puffs into the lungs every 6 (six) hours as needed. Shortness of breath    . b complex-vitamin c-folic acid (NEPHRO-VITE) 0.8 MG TABS tablet Take 1 tablet by mouth daily.    Marland Kitchen EPINEPHrine 0.3 mg/0.3 mL IJ SOAJ injection Inject 0.3 mg into the muscle as needed.    Marland Kitchen HYDROcodone-acetaminophen (NORCO) 5-325 MG tablet Take 1 tablet by mouth every 6 (six) hours as needed for severe pain. 10 tablet 0  . levothyroxine (SYNTHROID, LEVOTHROID) 300 MCG tablet Take 300 mcg by mouth at bedtime.      Marland Kitchen omeprazole (PRILOSEC) 40 MG capsule Take 1 capsule by mouth daily.    . sevelamer carbonate (RENVELA) 800 MG tablet Take 1,600-3,200 mg by mouth 3 (three) times daily with meals. 4 with meals and 3 with snacks    . Vitamin D, Ergocalciferol, (DRISDOL) 50000 units CAPS capsule Take 1 capsule by mouth once a week.      Results for orders placed or performed during the hospital encounter of 02/09/18 (from the past 48  hour(s))  Basic metabolic panel     Status: Abnormal   Collection Time: 02/09/18  4:37 AM  Result Value Ref Range   Sodium 136 135 - 145 mmol/L   Potassium 5.3 (H) 3.5 - 5.1 mmol/L   Chloride 95 (L) 101 - 111 mmol/L   CO2 24 22 - 32 mmol/L   Glucose, Bld 90 65 - 99 mg/dL   BUN 46 (H) 6 - 20 mg/dL   Creatinine, Ser 8.77 (H) 0.44 - 1.00 mg/dL   Calcium 8.4 (L) 8.9 - 10.3 mg/dL   GFR calc non Af Amer 5 (L) >60 mL/min   GFR calc Af Amer 6 (L) >60 mL/min    Comment: (NOTE) The  eGFR has been calculated using the CKD EPI equation. This calculation has not been validated in all clinical situations. eGFR's persistently <60 mL/min signify possible Chronic Kidney Disease.    Anion gap 17 (H) 5 - 15    Comment: Performed at [facility], [address], [place], [place] [address]  CBC with Differential/Platelet     Status: Abnormal   Collection Time: 02/09/18  4:37 AM  Result Value Ref Range   WBC 8.3 4.0 - 10.5 K/uL   RBC 3.78 (L) 3.87 - 5.11 MIL/uL   Hemoglobin 11.6 (L) 12.0 - 15.0 g/dL   HCT 36.9 36.0 - 46.0 %   MCV 97.6 78.0 - 100.0 fL   MCH 30.7 26.0 - 34.0 pg   MCHC 31.4 30.0 - 36.0 g/dL   RDW 14.8 11.5 - 15.5 %   Platelets 233 150 - 400 K/uL   Neutrophils Relative % 66 %   Neutro Abs 5.5 1.7 - 7.7 K/uL   Lymphocytes Relative 14 %   Lymphs Abs 1.1 0.7 - 4.0 K/uL   Monocytes Relative 14 %   Monocytes Absolute 1.1 (H) 0.1 - 1.0 K/uL   Eosinophils Relative 5 %   Eosinophils Absolute 0.4 0.0 - 0.7 K/uL   Basophils Relative 1 %   Basophils Absolute 0.1 0.0 - 0.1 K/uL    Comment: Performed at [facility], [address] [place], [place] [address]  Troponin I     Status: Abnormal   Collection Time: 02/09/18  4:37 AM  Result Value Ref Range   Troponin I 2.59 (HH) <0.03 ng/mL    Comment: CRITICAL RESULT CALLED TO, READ BACK BY AND VERIFIED WITH: WILLIAMS,C@[id number]  BY MATTHEWS, B 3.17.19 Performed at [facility], [address], [place], [place] [address]   I-Stat Troponin, ED (not at [facility])     Status: Abnormal   Collection Time: 02/09/18  4:43 AM  Result Value Ref Range   Troponin i, poc 1.22 (HH) 0.00 - 0.08 ng/mL   Comment NOTIFIED PHYSICIAN    Comment 3            Comment: Due to the release kinetics of cTnI, a negative result within the first hours of the onset of symptoms does not rule out myocardial infarction with certainty. If myocardial infarction is still suspected, repeat the test at appropriate intervals.   APTT     Status: None    Collection Time: 02/09/18  7:33 AM  Result Value Ref Range   aPTT 32 24 - 36 seconds    Comment: Performed at [facility], [address], [place], [place] [address]  Protime-INR     Status: None   Collection Time: 02/09/18  7:33 AM  Result Value Ref Range   Prothrombin Time 14.4 11.4 - 15.2 seconds  INR 1.13     Comment: Performed at [facility], [address] [place], [place] [address]   Ct Angio Chest Pe W And/or Wo Contrast  Result Date: 02/09/2018 CLINICAL DATA:  Shortness of breath. Left-sided chest pain. Dialysis patient. EXAM: CT ANGIOGRAPHY CHEST WITH CONTRAST TECHNIQUE: Multidetector CT imaging of the chest was performed using the standard protocol during bolus administration of intravenous contrast. Multiplanar CT image reconstructions and MIPs were obtained to evaluate the vascular anatomy. CONTRAST:  161m ISOVUE-370 IOPAMIDOL (ISOVUE-370) INJECTION 76% COMPARISON:  Chest radiograph earlier this day.  No prior chest CT. FINDINGS: Cardiovascular: There are no filling defects within the pulmonary arteries to suggest pulmonary embolus. Subsegmental branches not well assessed due to contrast bolus timing. Dilated main pulmonary artery measuring 4.1 cm. Dense contrast in the left upper extremity veins and prominent chest wall collaterals. Stenosis/occlusion of the left brachiocephalic vein with calcifications. Right internal jugular dialysis catheter tip in the right atrium. Right axillary vascular stent, patency not well assessed on the current exam. Age advanced aortic atherosclerosis. Age advanced coronary artery calcifications. Pericardial thickening/small pericardial effusion. Contrast refluxes into the hepatic veins and IVC. Mediastinum/Nodes: Prominent mediastinal collaterals. No adenopathy. Esophagus is decompressed. Post thyroidectomy. Lungs/Pleura: Mild heterogeneous attenuation of lung parenchyma with mild diffuse ground-glass opacities. No confluent consolidation. No septal  thickening. No pleural effusion. Trachea and mainstem bronchi are patent. Upper Abdomen: Hyperenhancement along the falciform ligament likely transient hepatic attenuation difference. Bilateral renal atrophy and cystic change. Prominent abdominal wall collaterals. Musculoskeletal: Mild diffuse increased bone mineral density consistent with renal osteodystrophy. There are no acute or suspicious osseous abnormalities. Review of the MIP images confirms the above findings. IMPRESSION: 1. No central pulmonary embolus. 2. Dilated main pulmonary artery suggesting pulmonary arterial hypertension. 3. Multi chamber cardiomegaly. Age advanced coronary artery calcifications and aortic atherosclerosis. Contrast refluxing into the hepatic veins and IVC consistent with elevated right heart pressures. 4. Mild heterogeneous attenuation of lung parenchyma with ground-glass opacities, may be small vessel or small airways disease. No septal thickening to suggest pulmonary edema. 5. 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 Suspect SVC stenosis, with right internal jugular dialysis catheter in place. Aortic Atherosclerosis (ICD10-I70.0). Electronically Signed   By: MJeb LeveringM.D.   On: 02/09/2018 06:50   Dg Chest Port 1 View  Result Date: 02/09/2018 CLINICAL DATA:  Shortness of breath EXAM: PORTABLE CHEST 1 VIEW COMPARISON:  Chest radiograph 09/10/2016 FINDINGS: Right IJ approach hemodialysis catheter tip is in the right atrium. Mild cardiomegaly with pulmonary vascular congestion. No overt pulmonary edema. No focal consolidation. No pleural effusion or pneumothorax. IMPRESSION: Mild cardiomegaly with pulmonary vascular congestion but no overt pulmonary edema. Electronically Signed   By: KUlyses JarredM.D.   On: 02/09/2018 05:32    Review of Systems  Constitutional: Positive for diaphoresis. Negative for chills and fever.  HENT: Negative for hearing  loss.   Eyes: Negative for blurred vision.  Respiratory: Positive for shortness of breath. Negative for cough.   Cardiovascular: Positive for chest pain.  Gastrointestinal: Negative for abdominal pain, nausea and vomiting.  Neurological: Negative for dizziness.    Blood pressure (!) 160/96, pulse (!) 56, temperature 97.6 F (36.4 C), resp. rate 15, height 5' 3"  (1.6 m), weight 106.3 kg (234 lb 5.6 oz), SpO2 100 %. Physical Exam  Constitutional: She is oriented to person, place, and time.  HENT:  Head: Normocephalic and atraumatic.  Eyes: Conjunctivae are normal. Pupils are equal,  round, and reactive to light. Left eye exhibits no discharge. No scleral icterus.  Neck: Normal range of motion. Neck supple. No JVD present. No tracheal deviation present. No thyromegaly present.  Cardiovascular: Normal rate and regular rhythm.  Murmur (soft systolic murmur noted no pericardial rub) heard. Respiratory: No respiratory distress. She has no wheezes.  Decreased breath sound at bases bilaterally Right IJ dialysis catheter site minimal yellowish slough and drainage noted  GI: Soft. Bowel sounds are normal. She exhibits distension. There is no tenderness. There is no rebound.  Lower abdominal wall erythema with small foul-smelling boil noted  Musculoskeletal: She exhibits edema (1+ edemanoted). She exhibits no tenderness or deformity.  Neurological: She is alert and oriented to person, place, and time.     Assessment/Plan Acute non-Q-wave myocardial infarction Coronary artery disease history of PCI in remote past Uncontrolled hypertension History of nephrotic syndrome End-stage renal disease on hemodialysis History of Hashimoto thyroiditis Hypothyroidism Morbid obesity GERD Possible catheter related sepsis Plan Check serial enzymes and EKG, lipid panel Check 2-D echo And continue heparin , IV nitroglycerin, aspirin, Plavix, statin and low-dose beta blocker Vascular surgery consult and  renal service consult Discussed briefly with patient regarding left cardiac catheterization possible PCI will schedule for Tuesday  Charolette Forward, MD 02/09/2018, 10:48 AM

## 2018-02-09 NOTE — ED Notes (Signed)
X ray in room.

## 2018-02-09 NOTE — ED Notes (Signed)
Date and time results received: 02/09/18  (use smartphrase ".now" to insert current time)  Test: Troponin Critical Value: 2.59  Name of Provider Notified: DR Christy Gentles  Orders Received? Or Actions Taken?:

## 2018-02-09 NOTE — Progress Notes (Signed)
ANTICOAGULATION CONSULT NOTE - Initial Consult  Pharmacy Consult for heparin Indication: chest pain/ACS/STEMI  Allergies  Allergen Reactions  . Bee Pollen Anaphylaxis  . Activase [Alteplase] Other (See Comments)    dyspnea  . Warfarin Sodium Nausea And Vomiting and Rash    Patient Measurements: Height: 5\' 3"  (160 cm) Weight: 234 lb 5.6 oz (106.3 kg) IBW/kg (Calculated) : 52.4 Heparin Dosing Weight: 77.7 kg  Vital Signs: Temp: 97.6 F (36.4 C) (03/17 0808) BP: 145/88 (03/17 0808) Pulse Rate: 62 (03/17 0808)  Labs: Recent Labs    02/09/18 0437 02/09/18 0733  HGB 11.6*  --   HCT 36.9  --   PLT 233  --   APTT  --  32  LABPROT  --  14.4  INR  --  1.13  CREATININE 8.77*  --   TROPONINI 2.59*  --     Estimated Creatinine Clearance: 9.2 mL/min (A) (by C-G formula based on SCr of 8.77 mg/dL (H)).   Medical History: Past Medical History:  Diagnosis Date  . Anemia   . ASCVD (arteriosclerotic cardiovascular disease)   . Calciphylaxis   . Chronic abdominal wound infection   . Dialysis patient ([place])   . ESRD (end stage renal disease) ([place])    Due to membranous GN dialysis 09/1996; peritoneal dialysis --? peitonitis; difficult vascular access  . GERD (gastroesophageal reflux disease)   . Hashimoto thyroiditis   . Hyperparathyroidism   . Hypertension   . Hypothyroidism   . Medically noncompliant   . Morbid obesity ([place])     Medications:   (Not in a hospital admission)  Assessment: Pharmacy has been consulted to dose heparin in patient with ACS/STEMI. Patient complains of chest pain and shortness of breath. She is ESRD on dialysis.  Goal of Therapy:  Heparin level 0.3-0.7 units/ml Monitor platelets by anticoagulation protocol: Yes   Plan:  Give 4000 units bolus x 1 Start heparin infusion at 1000 units/hr Check anti-Xa level in 8 hours and daily while on heparin Continue to monitor H&H and platelets  Margot Ables, PharmD Clinical Pharmacist 02/09/2018  8:35 AM

## 2018-02-09 NOTE — ED Provider Notes (Signed)
D/w dr Terrence Dupont for admission He requests heparin drip He would like patient in CCU Signed out to dr Jeneen Rinks with admission pending   Ripley Fraise, MD 02/09/18 [phone]

## 2018-02-09 NOTE — Consult Note (Addendum)
[facility] Renal Consultation Note    Indication for Consultation:  Management of ESRD/hemodialysis; anemia, hypertension/volume and secondary hyperparathyroidism PCP:  HPI: Lori [name] is a 47 y.o. female with ESRD on hemodialysis MWF at [facility]. PMH of membranous GN, HTN, GERD, Hashimoto thyroiditis, obesity, medical noncompliance. Last HD 02/07/18 shortened treatment 15 minutes left 1.6 above EDW. High IDWG.   Patient being seen in [place]. She is awake, alert, oriented, C/O chest pain 10/10. Asked nurse to uptitrate NTG gtt. Patient is localizing chest pain to L breast without radiation. Associated SOB, no N, V. Says chest pain started Thursday rated pain as 3/10 that waxed and waned through day and into Friday. Says she told RN at HD unit about pain. Patient says she awoke last night from sleep with 10/10 chest pain and associated SOB. She went to ED at [facility] for evaluation. EKG on arrival showed SR with PACS, incomplete LBBB. CXR with mild cardiomegaly and pulmonary vascular congestion without overt pulmonary edema. CT Angio negative for PE. HGB 11.6 WBC 8.3 K+ 5.3 Scr 8.77 Ca 8.4 Troponin 2.59 She was started on NTG and heparin and transferred here for evaluation of NSTEMI.  When I see her she says CP is better  Noted reference to infected TDC. Patient has had [facility] for quite sometime, always an excuse not to get permanent access. Apparently drainage was noted at access site. Checked TDC site-no drainage, no erythema. Patient denies pain, drainage at site, fever, chills.  Does not have appropriate drsg-drsg loose and slightly soiled. All white parts of the catheter are brown.  However, is not tender with palpation and I cannot milk any drainage  Past Medical History:  Diagnosis Date  . Anemia   . ASCVD (arteriosclerotic cardiovascular disease)   . Calciphylaxis   . Chronic abdominal wound infection   . Dialysis patient ([place])   . ESRD (end stage renal  disease) ([place])    Due to membranous GN dialysis 09/1996; peritoneal dialysis --? peitonitis; difficult vascular access  . GERD (gastroesophageal reflux disease)   . Hashimoto thyroiditis   . Hyperparathyroidism   . Hypertension   . Hypothyroidism   . Medically noncompliant   . Morbid obesity ([place])    Past Surgical History:  Procedure Laterality Date  . [place]  . THYROIDECTOMY, PARTIAL     Resectin of left lobe with reimplantation in the forearm, small focus of papillary carcinoma incidentally noted at pathology - 2000 and Hashimoto's thyrdoiditis in 2001  . TONSILLECTOMY AND ADENOIDECTOMY     Family History  Problem Relation Age of Onset  . Coronary artery disease Mother   . Kidney disease Father   . Diabetes Sister    Social History:  reports that she has quit smoking. she has never used smokeless tobacco. She reports that she does not drink alcohol or use drugs. Allergies  Allergen Reactions  . Bee Pollen Anaphylaxis  . Activase [Alteplase] Other (See Comments)    dyspnea  . Warfarin Sodium Nausea And Vomiting and Rash   Prior to Admission medications   Medication Sig Start Date End Date Taking? Authorizing Provider  albuterol (PROVENTIL HFA;VENTOLIN HFA) 108 (90 BASE) MCG/ACT inhaler Inhale 2 puffs into the lungs every 6 (six) hours as needed. Shortness of breath    [provider]  b complex-vitamin c-folic acid (NEPHRO-VITE) 0.8 MG TABS tablet Take 1 tablet by mouth daily.    [provider]  EPINEPHrine 0.3 mg/0.3 mL IJ SOAJ  injection Inject 0.3 mg into the muscle as needed.    [provider]  HYDROcodone-acetaminophen (NORCO) 5-325 MG tablet Take 1 tablet by mouth every 6 (six) hours as needed for severe pain. 09/10/16   Julianne Rice, MD  levothyroxine (SYNTHROID, LEVOTHROID) 300 MCG tablet Take 300 mcg by mouth at bedtime.      [provider]  omeprazole (PRILOSEC) 40 MG capsule Take 1 capsule by mouth daily.    [provider]  sevelamer carbonate (RENVELA) 800 MG tablet Take 1,600-3,200 mg by mouth 3 (three) times daily with meals. 4 with meals and 3 with snacks    [provider]  Vitamin D, Ergocalciferol, (DRISDOL) 50000 units CAPS capsule Take 1 capsule by mouth once a week.    [provider]   Current Facility-Administered Medications  Medication Dose Route Frequency Provider Last Rate Last Dose  . 0.9 %  sodium chloride infusion   Intravenous Continuous Charolette Forward, MD      . albuterol (PROVENTIL) (2.5 MG/3ML) 0.083% nebulizer solution 2.5 mg  2.5 mg Inhalation Q6H PRN Charolette Forward, MD      . Derrill Memo ON 02/10/2018] aspirin EC tablet 81 mg  81 mg Oral Daily Charolette Forward, MD      . atorvastatin (LIPITOR) tablet 80 mg  80 mg Oral q1800 Charolette Forward, MD      . ceFAZolin (ANCEF) IVPB 1 g/50 mL premix  1 g Intravenous Q24H Charolette Forward, MD      . clopidogrel (PLAVIX) tablet 300 mg  300 mg Oral Once Charolette Forward, MD      . Derrill Memo ON 02/10/2018] clopidogrel (PLAVIX) tablet 75 mg  75 mg Oral Q breakfast Charolette Forward, MD      . heparin ADULT infusion 100 units/mL (25000 units/241mL sodium chloride 0.45%)  1,000 Units/hr Intravenous Continuous Charolette Forward, MD 10 mL/hr at 02/09/18 0813 1,000 Units/hr at 02/09/18 0813  . levothyroxine (SYNTHROID, LEVOTHROID) tablet 300 mcg  300 mcg Oral QHS Charolette Forward, MD      . metoprolol tartrate (LOPRESSOR) tablet 12.5 mg  12.5 mg Oral BID Charolette Forward, MD      . nitroGLYCERIN (NITROSTAT) SL tablet 0.4 mg  0.4 mg Sublingual Q5 Min x 3 PRN Charolette Forward, MD      . nitroGLYCERIN 0.2 mg/mL in dextrose 5 % infusion      3 mL/hr at 02/09/18 1029 10 mg at 02/09/18 1029  . nitroGLYCERIN 50 mg in dextrose 5 % 250 mL (0.2 mg/mL) infusion  0-200 mcg/min Intravenous Titrated Charolette Forward, MD      . ondansetron (ZOFRAN) injection 4 mg  4 mg Intravenous Q6H PRN Charolette Forward, MD      . pantoprazole (PROTONIX) EC tablet 40 mg  40 mg Oral Daily  Charolette Forward, MD      . sevelamer carbonate (RENVELA) tablet 2,400 mg  2,400 mg Oral With snacks Charolette Forward, MD      . sevelamer carbonate (RENVELA) tablet 3,200 mg  3,200 mg Oral TID WC Charolette Forward, MD      . traMADol (ULTRAM) tablet 50 mg  50 mg Oral Q8H PRN Charolette Forward, MD      . Derrill Memo ON 02/10/2018] Vitamin D (Ergocalciferol) (DRISDOL) capsule 50,000 Units  50,000 Units Oral Weekly Charolette Forward, MD       Labs: Basic Metabolic Panel: Recent Labs  Lab 02/09/18 0437  NA 136  K 5.3*  CL 95*  CO2 24  GLUCOSE 90  BUN 46*  CREATININE 8.77*  CALCIUM 8.4*   Liver Function Tests: No results for input(s): AST, ALT, ALKPHOS, BILITOT, PROT, ALBUMIN in the last 168 hours. No results for input(s): LIPASE, AMYLASE in the last 168 hours. No results for input(s): AMMONIA in the last 168 hours. CBC: Recent Labs  Lab 02/09/18 0437  WBC 8.3  NEUTROABS 5.5  HGB 11.6*  HCT 36.9  MCV 97.6  PLT 233   Cardiac Enzymes: Recent Labs  Lab 02/09/18 0437  TROPONINI 2.59*   CBG: No results for input(s): GLUCAP in the last 168 hours. Iron Studies: No results for input(s): IRON, TIBC, TRANSFERRIN, FERRITIN in the last 72 hours. Studies/Results: Ct Angio Chest Pe W And/or Wo Contrast  Result Date: 02/09/2018 CLINICAL DATA:  Shortness of breath. Left-sided chest pain. Dialysis patient. EXAM: CT ANGIOGRAPHY CHEST WITH CONTRAST TECHNIQUE: Multidetector CT imaging of the chest was performed using the standard protocol during bolus administration of intravenous contrast. Multiplanar CT image reconstructions and MIPs were obtained to evaluate the vascular anatomy. CONTRAST:  119mL ISOVUE-370 IOPAMIDOL (ISOVUE-370) INJECTION 76% COMPARISON:  Chest radiograph earlier this day.  No prior chest CT. FINDINGS: Cardiovascular: There are no filling defects within the pulmonary arteries to suggest pulmonary embolus. Subsegmental branches not well assessed due to contrast bolus timing. Dilated main  pulmonary artery measuring 4.1 cm. Dense contrast in the left upper extremity veins and prominent chest wall collaterals. Stenosis/occlusion of the left brachiocephalic vein with calcifications. Right internal jugular dialysis catheter tip in the right atrium. Right axillary vascular stent, patency not well assessed on the current exam. Age advanced aortic atherosclerosis. Age advanced coronary artery calcifications. Pericardial thickening/small pericardial effusion. Contrast refluxes into the hepatic veins and IVC. Mediastinum/Nodes: Prominent mediastinal collaterals. No adenopathy. Esophagus is decompressed. Post thyroidectomy. Lungs/Pleura: Mild heterogeneous attenuation of lung parenchyma with mild diffuse ground-glass opacities. No confluent consolidation. No septal thickening. No pleural effusion. Trachea and mainstem bronchi are patent. Upper Abdomen: Hyperenhancement along the falciform ligament likely transient hepatic attenuation difference. Bilateral renal atrophy and cystic change. Prominent abdominal wall collaterals. Musculoskeletal: Mild diffuse increased bone mineral density consistent with renal osteodystrophy. There are no acute or suspicious osseous abnormalities. Review of the MIP images confirms the above findings. IMPRESSION: 1. No central pulmonary embolus. 2. Dilated main pulmonary artery suggesting pulmonary arterial hypertension. 3. Multi chamber cardiomegaly. Age advanced coronary artery calcifications and aortic atherosclerosis. Contrast refluxing into the hepatic veins and IVC consistent with elevated right heart pressures. 4. Mild heterogeneous attenuation of lung parenchyma with ground-glass opacities, may be small vessel or small airways disease. No septal thickening to suggest pulmonary edema. 5. 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 Suspect SVC stenosis, with right internal jugular dialysis catheter in  place. Aortic Atherosclerosis (ICD10-I70.0). Electronically Signed   By: Jeb Levering M.D.   On: 02/09/2018 06:50   Dg Chest Port 1 View  Result Date: 02/09/2018 CLINICAL DATA:  Shortness of breath EXAM: PORTABLE CHEST 1 VIEW COMPARISON:  Chest radiograph 09/10/2016 FINDINGS: Right IJ approach hemodialysis catheter tip is in the right atrium. Mild cardiomegaly with pulmonary vascular congestion. No overt pulmonary edema. No focal consolidation. No pleural effusion or pneumothorax. IMPRESSION: Mild cardiomegaly with pulmonary vascular congestion but no overt pulmonary edema. Electronically Signed   By: Ulyses Jarred M.D.   On: 02/09/2018 05:32    ROS: As per HPI otherwise negative.  Physical Exam: Vitals:   02/09/18 0700 02/09/18 0808 02/09/18 0830 02/09/18 0900  BP: 133/86 Marland Kitchen)  145/88 (!) 146/62 (!) 160/96  Pulse: 61 62 (!) 57 (!) 56  Resp: 14 16 15 15   Temp:  97.6 F (36.4 C)  (!) 97.5 F (36.4 C)  TempSrc:    Oral  SpO2: 100% 100% 100% 100%  Weight:    109.7 kg (241 lb 13.5 oz)  Height:         General: Chronically ill appearing female, looks older than stated age. No acute distress. Head: Normocephalic, atraumatic, sclera non-icteric, mucus membranes are moist Neck: Supple. JVD not elevated. Lungs: Clear bilaterally to auscultation without wheezes, rales, or rhonchi. Breathing is unlabored. Heart: RRR with S1 S2. 2/6 systolic M. No R/G SR No rubs or gallops appreciated. Abdomen: Soft, non-tender, non-distended with normoactive bowel sounds. No rebound/guarding. No obvious abdominal masses. M-S:  Strength and tone appear normal for age. Lower extremities: 1+ BLE edema.  Neuro: Alert and oriented X 3. Moves all extremities spontaneously. Psych:  Responds to questions appropriately with a normal affect. Dialysis Access: [facility] without drainage, erythema at site. Drsg soiled, non-occlusive. All white parts of catheter are brown  Dialysis Orders: [facility] MWF 4 hrs 180 NRe  400/800 105 kg 2.0 K/2.25 Ca Linear Sodium -Heparin 10000 units IV TIW -Calcitriol 0.5 mcg PO TIW  BMD meds: Renvela 800 mg 4 tabs PO TID AC 2 tabs with snacks  Last Labs: HGB 10.7 (02/05/18)  Tsat 50 Fe 120 Ca 9.8 C Ca 9.5 Phos 8.1 PTH 555 (02/13-02/20/19)  Assessment/Plan: 1.  NSTEMI: Per primary. For cardiac cath possibly Tuesday  2.  ESRD -  MWF. Having active chest pain, no compelling need for HD today. HD tomorrow 1st shift. K+ 5.3. Recheck labs with HD tomorrow.  3.  Possible TDC infection: No evidence of drainage/erythema per exam. BC X 2 ordered. Call IV team to redress TDC. AFebrile, WBC 8.3. I agree that it looks nasty/dirty  but not infected- may be have IR change out over wire while here ?  4.  Hypertension/volume  - BP 146/91. No antihypertensive meds on home med list. Uptitrating NTG for chest pain. Has been started on metoprolol per primary. No evidence of overt volume overload.  5.  Anemia  - HGB 11.6 No OP ESA. Monitor. 6.  Metabolic bone disease - Traditionally noncompliant with BMD meds. Cont binders/VDRA.  7.  Nutrition - Renal diet, renal vit, refuses nepro. 8.    Hypothyroidism: Per primary.   Rita H. Owens Shark, NP-C 02/09/2018, 11:44 AM  [facility] Beeper [phone]  Patient seen and examined, agree with above note with above modifications. Pt well known to [place]- ESRD TTS at [facility].  Presented with CP and does have hx of CAD.  CP currently being managed by NTG- will plan for regular dialysis on schedule tomorrow.  HD catheter does not appear obviously infected- but blood cultures taken, may want to have IR change catheter out for clean one while here- could do oover a wire ?? Lori Parish, MD 02/09/2018

## 2018-02-09 NOTE — ED Provider Notes (Signed)
[facility] Provider Note   CSN: [id number] Arrival date & time: 02/09/18  0421     History   Chief Complaint Chief Complaint  Patient presents with  . Shortness of Breath    HPI CHRISLYN SEEDORF is a 47 y.o. female.  The history is provided by the patient.  Shortness of Breath  This is a new problem. The problem occurs frequently.The current episode started more than 2 days ago. The problem has been gradually worsening. Associated symptoms include chest pain. Pertinent negatives include no fever and no cough. She has tried nothing for the symptoms.  Patient with history of end-stage renal disease on dialysis, previous history of CAD, history of anemia, history of hypertension, presents with chest pain and shortness of breath.  Over 2 days ago she began having shortness of breath, as well as chest pain.  She reports left-sided CP and now is moving to the right side of her chest.  It is not sharp, and feels pressure.  At times it does radiate to the back. Last dialysis session was 2 days ago.  Denies any flulike illness  She does report previous history of cardiac stent.  Past Medical History:  Diagnosis Date  . Anemia   . ASCVD (arteriosclerotic cardiovascular disease)   . Calciphylaxis   . Chronic abdominal wound infection   . Dialysis patient ([place])   . ESRD (end stage renal disease) ([place])    Due to membranous GN dialysis 09/1996; peritoneal dialysis --? peitonitis; difficult vascular access  . GERD (gastroesophageal reflux disease)   . Hashimoto thyroiditis   . Hyperparathyroidism   . Hypertension   . Hypothyroidism   . Medically noncompliant   . Morbid obesity [facility])     Patient Active Problem List   Diagnosis Date Noted  . Bleeding from wound 03/27/2014  . Hemorrhagic shock ([place]) 03/27/2014  . Hypocalcemia 03/27/2014  . ESRD on dialysis ([place]) 03/27/2014  . Acute blood loss anemia 03/27/2014  . Hypothyroidism 03/27/2014  . Chronic abdominal  wound infection 03/27/2014  . Calciphylaxis 03/27/2014  . Cellulitis 08/11/2012  . ATHEROSCLEROTIC CARDIOVASCULAR DISEASE 09/22/2010  . SYNCOPE 07/16/2010  . POSTURAL LIGHTHEADEDNESS 07/16/2010  . OBESITY 07/14/2010  . Anemia of other chronic disease 07/14/2010  . GASTROESOPHAGEAL REFLUX DISEASE 07/14/2010  . RENAL FAILURE, END STAGE 07/14/2010  . PALPITATIONS 07/14/2010  . HYPERPARATHYROIDISM, HX OF 07/14/2010    Past Surgical History:  Procedure Laterality Date  . [place]  . THYROIDECTOMY, PARTIAL     Resectin of left lobe with reimplantation in the forearm, small focus of papillary carcinoma incidentally noted at pathology - 2000 and Hashimoto's thyrdoiditis in 2001  . TONSILLECTOMY AND ADENOIDECTOMY      OB History    Gravida Para Term Preterm AB Living   1 1   1        SAB TAB Ectopic Multiple Live Births                   Home Medications    Prior to Admission medications   Medication Sig Start Date End Date Taking? Authorizing Provider  albuterol (PROVENTIL HFA;VENTOLIN HFA) 108 (90 BASE) MCG/ACT inhaler Inhale 2 puffs into the lungs every 6 (six) hours as needed. Shortness of breath    [provider]  b complex-vitamin c-folic acid (NEPHRO-VITE) 0.8 MG TABS tablet Take 1 tablet by mouth daily.    [provider]  HYDROcodone-acetaminophen (NORCO) 5-325 MG tablet Take 1 tablet by mouth every  6 (six) hours as needed for severe pain. 09/10/16   Julianne Rice, MD  levothyroxine (SYNTHROID, LEVOTHROID) 300 MCG tablet Take 300 mcg by mouth at bedtime.      [provider]  sevelamer carbonate (RENVELA) 800 MG tablet Take 1,600-3,200 mg by mouth 3 (three) times daily with meals. 4 with meals and 3 with snacks    [provider]    Family History Family History  Problem Relation Age of Onset  . Coronary artery disease Mother   . Kidney disease Father   . Diabetes Sister     Social History Social History   Tobacco Use  .  Smoking status: Former Research scientist (life sciences)  . Smokeless tobacco: Never Used  Substance Use Topics  . Alcohol use: No  . Drug use: No     Allergies   Bee pollen; Activase [alteplase]; and Warfarin sodium   Review of Systems Review of Systems  Constitutional: Negative for fever.  Respiratory: Positive for shortness of breath. Negative for cough.   Cardiovascular: Positive for chest pain.  All other systems reviewed and are negative.    Physical Exam Updated Vital Signs BP 125/88   Pulse 77   Temp 97.6 F (36.4 C)   Resp 16   Ht 1.6 m (5\' 3" )   Wt 106.3 kg (234 lb 5.6 oz)   SpO2 100%   BMI 41.51 kg/m   Physical Exam CONSTITUTIONAL: Uncomfortable appearing, chronically ill-appearing, appears older than stated age HEAD: Normocephalic/atraumatic EYES: EOMI ENMT: Mucous membranes moist NECK: supple no meningeal signs SPINE/BACK:entire spine nontender CV: S1/S2 noted, no murmurs/rubs/gallops noted LUNGS: tachypnea, decreased breath sounds bilaterally   ABDOMEN: soft, nontender  GU:no cva tenderness NEURO: Pt is awake/alert/appropriate, moves all extremitiesx4.  No facial droop.   EXTREMITIES: pulses normal/equal, full ROM SKIN: warm, color normal, dialysis catheter right chest PSYCH: mildly anxious   ED Treatments / Results  Labs (all labs ordered are listed, but only abnormal results are displayed) Labs Reviewed  BASIC METABOLIC PANEL - Abnormal; Notable for the following components:      Result Value   Potassium 5.3 (*)    Chloride 95 (*)    BUN 46 (*)    Creatinine, Ser 8.77 (*)    Calcium 8.4 (*)    GFR calc non Af Amer 5 (*)    GFR calc Af Amer 6 (*)    Anion gap 17 (*)    All other components within normal limits  CBC WITH DIFFERENTIAL/PLATELET - Abnormal; Notable for the following components:   RBC 3.78 (*)    Hemoglobin 11.6 (*)    Monocytes Absolute 1.1 (*)    All other components within normal limits  TROPONIN I - Abnormal; Notable for the following  components:   Troponin I 2.59 (*)    All other components within normal limits  I-STAT TROPONIN, ED - Abnormal; Notable for the following components:   Troponin i, poc 1.22 (*)    All other components within normal limits    EKG  EKG Interpretation  Date/Time:  Sunday February 09 2018 04:58:25 EDT Ventricular Rate:  75 PR Interval:    QRS Duration: 114 QT Interval:  431 QTC Calculation: 482 R Axis:   11 Text Interpretation:  Sinus rhythm Ventricular premature complex Incomplete left bundle branch block Borderline low voltage, extremity leads Anterior Q waves, possibly due to ILBBB Confirmed by Ripley Fraise [phone]) on 02/09/2018 5:05:03 AM       Radiology Dg Chest [facility]  Result Date: 02/09/2018 CLINICAL DATA:  Shortness of breath EXAM: PORTABLE CHEST 1 VIEW COMPARISON:  Chest radiograph 09/10/2016 FINDINGS: Right IJ approach hemodialysis catheter tip is in the right atrium. Mild cardiomegaly with pulmonary vascular congestion. No overt pulmonary edema. No focal consolidation. No pleural effusion or pneumothorax. IMPRESSION: Mild cardiomegaly with pulmonary vascular congestion but no overt pulmonary edema. Electronically Signed   By: Ulyses Jarred M.D.   On: 02/09/2018 05:32    Procedures Procedures  CRITICAL CARE Performed by: Sharyon Cable Total critical care time: 45 minutes Critical care time was exclusive of separately billable procedures and treating other patients. Critical care was necessary to treat or prevent imminent or life-threatening deterioration. Critical care was time spent personally by me on the following activities: development of treatment plan with patient and/or surrogate as well as nursing, discussions with consultants, evaluation of patient's response to treatment, examination of patient, obtaining history from patient or surrogate, ordering and performing treatments and interventions, ordering and review of laboratory studies, ordering and review  of radiographic studies, pulse oximetry and re-evaluation of patient's condition.  Medications Ordered in ED Medications  nitroGLYCERIN (NITROSTAT) SL tablet 0.4 mg (0.4 mg Sublingual Given 02/09/18 0521)  fentaNYL (SUBLIMAZE) injection 100 mcg (100 mcg Intravenous Given 02/09/18 0536)  iopamidol (ISOVUE-370) 76 % injection 100 mL (100 mLs Intravenous Contrast Given 02/09/18 0604)     Initial Impression / Assessment and Plan / ED Course  I have reviewed the triage vital signs and the nursing notes.  Pertinent labs & imaging results that were available during my care of the patient were reviewed by me and considered in my medical decision making (see chart for details).     5:19 AM Patient with multiple medical conditions including end-stage renal disease presents with chest pain shortness of breath.  She is ill-appearing at this time.  2 EKGs been performed, no STEMI However bedside troponin is positive.  Will need further evaluation and admission. 5:48 AM Patient still very uncomfortable, appearing anxious and writhing in the bed.  There is also some component of pleuritic type chest pain as well.  Patient reports previous history of blood clots.  Will proceed with CT angios chest to evaluate for any PE. 6:39 AM Patient resting comfortably at this time Waiting CT results of that admission 7:24 AM Cardiology paged for admission Signed out to dr mark Jeneen Rinks with admission pending  Final Clinical Impressions(s) / ED Diagnoses   Final diagnoses:  Non-STEMI (non-ST elevated myocardial infarction) [facility])    ED Discharge Orders    None       Ripley Fraise, MD 02/09/18 [phone]

## 2018-02-10 LAB — BASIC METABOLIC PANEL
Anion gap: 16 — ABNORMAL HIGH (ref 5–15)
BUN: 64 mg/dL — ABNORMAL HIGH (ref 6–20)
CO2: 24 mmol/L (ref 22–32)
Calcium: 7.6 mg/dL — ABNORMAL LOW (ref 8.9–10.3)
Chloride: 93 mmol/L — ABNORMAL LOW (ref 101–111)
Creatinine, Ser: 11.49 mg/dL — ABNORMAL HIGH (ref 0.44–1.00)
GFR calc Af Amer: 4 mL/min — ABNORMAL LOW (ref >60)
GFR calc non Af Amer: 3 mL/min — ABNORMAL LOW (ref >60)
Glucose, Bld: 106 mg/dL — ABNORMAL HIGH (ref 65–99)
Potassium: 5.7 mmol/L — ABNORMAL HIGH (ref 3.5–5.1)
Sodium: 133 mmol/L — ABNORMAL LOW (ref 135–145)

## 2018-02-10 LAB — RENAL FUNCTION PANEL
Albumin: 3.4 g/dL — ABNORMAL LOW (ref 3.5–5.0)
Anion gap: 11 (ref 5–15)
BUN: 24 mg/dL — ABNORMAL HIGH (ref 6–20)
CO2: 25 mmol/L (ref 22–32)
Calcium: 7.7 mg/dL — ABNORMAL LOW (ref 8.9–10.3)
Chloride: 96 mmol/L — ABNORMAL LOW (ref 101–111)
Creatinine, Ser: 6.05 mg/dL — ABNORMAL HIGH (ref 0.44–1.00)
GFR calc Af Amer: 9 mL/min — ABNORMAL LOW (ref >60)
GFR calc non Af Amer: 7 mL/min — ABNORMAL LOW (ref >60)
Glucose, Bld: 92 mg/dL (ref 65–99)
Phosphorus: 4.2 mg/dL (ref 2.5–4.6)
Potassium: 3.9 mmol/L (ref 3.5–5.1)
Sodium: 132 mmol/L — ABNORMAL LOW (ref 135–145)

## 2018-02-10 LAB — CBC
HCT: 32.5 % — ABNORMAL LOW (ref 36.0–46.0)
Hemoglobin: 10.5 g/dL — ABNORMAL LOW (ref 12.0–15.0)
MCH: 31.4 pg (ref 26.0–34.0)
MCHC: 32.3 g/dL (ref 30.0–36.0)
MCV: 97.3 fL (ref 78.0–100.0)
Platelets: 198 10*3/uL (ref 150–400)
RBC: 3.34 MIL/uL — ABNORMAL LOW (ref 3.87–5.11)
RDW: 15.2 % (ref 11.5–15.5)
WBC: 10.5 10*3/uL (ref 4.0–10.5)

## 2018-02-10 LAB — HEPARIN LEVEL (UNFRACTIONATED)
Heparin Unfractionated: 0.16 IU/mL — ABNORMAL LOW (ref 0.30–0.70)
Heparin Unfractionated: 0.43 IU/mL (ref 0.30–0.70)

## 2018-02-10 LAB — LIPID PANEL
Cholesterol: 117 mg/dL (ref 0–200)
HDL: 31 mg/dL — ABNORMAL LOW (ref >40)
LDL Cholesterol: 69 mg/dL (ref 0–99)
Total CHOL/HDL Ratio: 3.8 RATIO
Triglycerides: 86 mg/dL (ref <150)
VLDL: 17 mg/dL (ref 0–40)

## 2018-02-10 LAB — TROPONIN I: Troponin I: 12.28 ng/mL (ref <0.03)

## 2018-02-10 MED ORDER — SODIUM CHLORIDE 0.9 % IV SOLN: 100.0000 mL | INTRAVENOUS | Status: DC | PRN

## 2018-02-10 MED ORDER — LIDOCAINE-PRILOCAINE 2.5-2.5 % EX CREA: 1.0000 "application " | TOPICAL_CREAM | CUTANEOUS | Status: DC | PRN

## 2018-02-10 MED ORDER — LIDOCAINE HCL (PF) 1 % IJ SOLN: 5.0000 mL | INTRAMUSCULAR | Status: DC | PRN

## 2018-02-10 MED ORDER — PENTAFLUOROPROP-TETRAFLUOROETH EX AERO: 1.0000 "application " | INHALATION_SPRAY | CUTANEOUS | Status: DC | PRN

## 2018-02-10 NOTE — Progress Notes (Signed)
Subjective:  Denies any further chest pains or shortness of breath.  Objective:  Vital Signs in the last 24 hours: Temp:  [96.5 F (35.8 C)-98.5 F (36.9 C)] 97.7 F (36.5 C) (03/18 0724) Pulse Rate:  [54-71] 57 (03/18 0800) Resp:  [13-17] 13 (03/18 0800) BP: (116-157)/(44-99) 135/94 (03/18 0800) SpO2:  [99 %-100 %] 100 % (03/18 0800)  Intake/Output from previous day: 03/17 0701 - 03/18 0700 In: 628.9 [P.O.:310; I.V.:318.9] Out: -  Intake/Output from this shift: Total I/O In: 14.5 [I.V.:14.5] Out: -   Physical Exam: Neck: no adenopathy, no carotid bruit, no JVD and supple, symmetrical, trachea midline Lungs: decreased breath sounds at bases Heart: regular rate and rhythm, S1, S2 normal and soft systolic murmur noted Abdomen: soft, non-tender; bowel sounds normal; no masses,  no organomegaly Extremities: 1+ edema noted  Lab Results: Recent Labs    02/09/18 0437 02/09/18 1232  WBC 8.3 8.7  HGB 11.6* 10.6*  PLT 233 198   Recent Labs    02/09/18 0437 02/09/18 1232  NA 136 134*  K 5.3* 5.1  CL 95* 95*  CO2 24 24  GLUCOSE 90 87  BUN 46* 46*  CREATININE 8.77* 9.24*   Recent Labs    02/09/18 1232 02/09/18 1657  TROPONINI 3.03* 4.82*   Hepatic Function Panel Recent Labs    02/09/18 1232  PROT 6.7  ALBUMIN 3.6  AST 19  ALT 11*  ALKPHOS 47  BILITOT 0.7   No results for input(s): CHOL in the last 72 hours. No results for input(s): PROTIME in the last 72 hours.  Imaging: Imaging results have been reviewed and Ct Angio Chest Pe W And/or Wo Contrast  Result Date: 02/09/2018 CLINICAL DATA:  Shortness of breath. Left-sided chest pain. Dialysis patient. EXAM: CT ANGIOGRAPHY CHEST WITH CONTRAST TECHNIQUE: Multidetector CT imaging of the chest was performed using the standard protocol during bolus administration of intravenous contrast. Multiplanar CT image reconstructions and MIPs were obtained to evaluate the vascular anatomy. CONTRAST:  165mL ISOVUE-370  IOPAMIDOL (ISOVUE-370) INJECTION 76% COMPARISON:  Chest radiograph earlier this day.  No prior chest CT. FINDINGS: Cardiovascular: There are no filling defects within the pulmonary arteries to suggest pulmonary embolus. Subsegmental branches not well assessed due to contrast bolus timing. Dilated main pulmonary artery measuring 4.1 cm. Dense contrast in the left upper extremity veins and prominent chest wall collaterals. Stenosis/occlusion of the left brachiocephalic vein with calcifications. Right internal jugular dialysis catheter tip in the right atrium. Right axillary vascular stent, patency not well assessed on the current exam. Age advanced aortic atherosclerosis. Age advanced coronary artery calcifications. Pericardial thickening/small pericardial effusion. Contrast refluxes into the hepatic veins and IVC. Mediastinum/Nodes: Prominent mediastinal collaterals. No adenopathy. Esophagus is decompressed. Post thyroidectomy. Lungs/Pleura: Mild heterogeneous attenuation of lung parenchyma with mild diffuse ground-glass opacities. No confluent consolidation. No septal thickening. No pleural effusion. Trachea and mainstem bronchi are patent. Upper Abdomen: Hyperenhancement along the falciform ligament likely transient hepatic attenuation difference. Bilateral renal atrophy and cystic change. Prominent abdominal wall collaterals. Musculoskeletal: Mild diffuse increased bone mineral density consistent with renal osteodystrophy. There are no acute or suspicious osseous abnormalities. Review of the MIP images confirms the above findings. IMPRESSION: 1. No central pulmonary embolus. 2. Dilated main pulmonary artery suggesting pulmonary arterial hypertension. 3. Multi chamber cardiomegaly. Age advanced coronary artery calcifications and aortic atherosclerosis. Contrast refluxing into the hepatic veins and IVC consistent with elevated right heart pressures. 4. Mild heterogeneous attenuation of lung parenchyma with  ground-glass opacities, may be  small vessel or small airways disease. No septal thickening to suggest pulmonary edema. 5. 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 Suspect SVC stenosis, with right internal jugular dialysis catheter in place. Aortic Atherosclerosis (ICD10-I70.0). Electronically Signed   By: Jeb Levering M.D.   On: 02/09/2018 06:50   Dg Chest Port 1 View  Result Date: 02/09/2018 CLINICAL DATA:  Shortness of breath EXAM: PORTABLE CHEST 1 VIEW COMPARISON:  Chest radiograph 09/10/2016 FINDINGS: Right IJ approach hemodialysis catheter tip is in the right atrium. Mild cardiomegaly with pulmonary vascular congestion. No overt pulmonary edema. No focal consolidation. No pleural effusion or pneumothorax. IMPRESSION: Mild cardiomegaly with pulmonary vascular congestion but no overt pulmonary edema. Electronically Signed   By: Ulyses Jarred M.D.   On: 02/09/2018 05:32    Cardiac Studies:  Assessment/Plan:  Acute non-Q-wave myocardial infarction Coronary artery disease history of PCI in remote past Uncontrolled hypertension History of nephrotic syndrome End-stage renal disease on hemodialysis History of Hashimoto thyroiditis Hypothyroidism Morbid obesity GERD Possible catheter related sepsis Plan Discussed with patient regarding left cardiac catheterization, possible PTCA stenting.  This risk and benefits, I.e., death, MI, stroke, need for emergency CABG, local vascular complications, etc., and consents for PCI.  Patient scheduled by IR to change dialysis catheter in a.m. Okay to transfer to stepdown unit   LOS: 1 day    Charolette Forward 02/10/2018, 10:02 AM

## 2018-02-10 NOTE — Progress Notes (Signed)
[facility] Progress Note  Subjective: no /co, no CP or SOB  Vitals:   02/10/18 0600 02/10/18 0724 02/10/18 0800 02/10/18 1156  BP: 116/74  (!) 135/94   Pulse: (!) 55  (!) 57   Resp: 13  13   Temp:  97.7 F (36.5 C)  97.8 F (36.6 C)  TempSrc:  Oral  Oral  SpO2: 100%  100%   Weight:      Height:        Inpatient medications: . aspirin EC  81 mg Oral Daily  . atorvastatin  80 mg Oral q1800  . clopidogrel  75 mg Oral Q breakfast  . levothyroxine  300 mcg Oral QHS  . metoprolol tartrate  12.5 mg Oral BID  . multivitamin  1 tablet Oral QHS  . pantoprazole  40 mg Oral Daily  . sevelamer carbonate  2,400 mg Oral With snacks  . sevelamer carbonate  3,200 mg Oral TID WC  . Vitamin D (Ergocalciferol)  50,000 Units Oral Weekly   . sodium chloride    . heparin 1,450 Units/hr (02/10/18 0345)  . nitroGLYCERIN Stopped (02/09/18 1524)   albuterol, nitroGLYCERIN, ondansetron (ZOFRAN) IV, traMADol  Exam: Chron ill appearing WF, no distress No jVd Chest clear bilat, no rales or wheezing Cor RRR 2/6 sem, no RG Abd soft obese ntnd no hsm Ext 1+ bilat LE edema NF Ox 3 L IJ TDC site clearn  Dialysis: MWF [facility] 4h  400/800  105kg  2/2.25 bath  Hep 10K -calcitriol 0.5 ug tiw - BMD meds: Renvela 800 mg 4 tabs PO TID AC 2 tabs with snacks - last Labs: HGB 10.7 (02/05/18)  Tsat 50 Fe 120 Ca 9.8 C Ca 9.5 Phos 8.1 PTH 555       Impression: 1  NSTEMI - per primary, for LHC today 2  ESRD HD MWF. HD today.   3  [place] TDC infection - blood cx's pending, cath dirty but no gross infection, observe 4  HTN/ vol - up 3-4kg 5  Anemia - Hb 11, no esa  6  MBD ckd - cont meds 7  Nutrition - cont rena vite, renal diet   Plan - HD today   Kelly Splinter MD [facility] pager [phone]   02/10/2018, 12:21 PM   Recent Labs  Lab 02/09/18 0437 02/09/18 1232  NA 136 134*  K 5.3* 5.1  CL 95* 95*  CO2 24 24  GLUCOSE 90 87  BUN 46* 46*  CREATININE 8.77*  9.24*  CALCIUM 8.4* 7.9*   Recent Labs  Lab 02/09/18 1232  AST 19  ALT 11*  ALKPHOS 47  BILITOT 0.7  PROT 6.7  ALBUMIN 3.6   Recent Labs  Lab 02/09/18 0437 02/09/18 1232  WBC 8.3 8.7  NEUTROABS 5.5 6.6  HGB 11.6* 10.6*  HCT 36.9 32.9*  MCV 97.6 96.8  PLT 233 198   Iron/TIBC/Ferritin/ %Sat No results found for: IRON, TIBC, FERRITIN, IRONPCTSAT

## 2018-02-10 NOTE — Progress Notes (Signed)
IR consulted for possible tunneled dialysis catheter exchange.  Catheter assessed.  No erythema or drainage.  No signs of infection.  Catheter and ports themselves are dirty with a layer of brown dirt/scum. However, it is currently working well.  Discussed with Nephrology NP. Apparently has been in place since 2015 without issue.  Discussed with Dr. Vernard Gambles. No indication for emergent exchange at this time.   Brynda Greathouse, MS RD PA-C 11:25 AM

## 2018-02-10 NOTE — Progress Notes (Signed)
[place] for Heparin Indication: chest pain/ACS/STEMI  Allergies  Allergen Reactions  . Bee Pollen Anaphylaxis  . Activase [Alteplase] Other (See Comments)    dyspnea  . Warfarin Sodium Nausea And Vomiting and Rash    Patient Measurements: Height: 5\' 3"  (160 cm) Weight: 241 lb 13.5 oz (109.7 kg) IBW/kg (Calculated) : 52.4 Heparin Dosing Weight: 77.7 kg  Vital Signs: Temp: 98.5 F (36.9 C) (03/17 2349) Temp Source: Oral (03/17 2349) BP: 137/96 (03/18 0000) Pulse Rate: 64 (03/17 2200)  Labs: Recent Labs    02/09/18 0437 02/09/18 0733 02/09/18 1232 02/09/18 1657 02/10/18 0137  HGB 11.6*  --  10.6*  --   --   HCT 36.9  --  32.9*  --   --   PLT 233  --  198  --   --   APTT  --  32  --   --   --   LABPROT  --  14.4  --   --   --   INR  --  1.13  --   --   --   HEPARINUNFRC  --   --   --  <0.10* 0.16*  CREATININE 8.77*  --  9.24*  --   --   TROPONINI 2.59*  --  3.03* 4.82*  --     Estimated Creatinine Clearance: 8.9 mL/min (A) (by C-G formula based on SCr of 9.24 mg/dL (H)).  Assessment: 8 YOF transferred from [facility] on heparin for ACS, possible cath Tuesday, heparin level remains sub-therapeutic, no issues per RN.   Goal of Therapy:  Heparin level 0.3-0.7 units/ml Monitor platelets by anticoagulation protocol: Yes   Plan:  Inc heparin to 1450 units/hr 1100 HL  Narda Bonds, PharmD, BCPS Clinical Pharmacist Phone: [phone]

## 2018-02-10 NOTE — H&P (View-Only) (Signed)
Subjective:  Denies any further chest pains or shortness of breath.  Objective:  Vital Signs in the last 24 hours: Temp:  [96.5 F (35.8 C)-98.5 F (36.9 C)] 97.7 F (36.5 C) (03/18 0724) Pulse Rate:  [54-71] 57 (03/18 0800) Resp:  [13-17] 13 (03/18 0800) BP: (116-157)/(44-99) 135/94 (03/18 0800) SpO2:  [99 %-100 %] 100 % (03/18 0800)  Intake/Output from previous day: 03/17 0701 - 03/18 0700 In: 628.9 [P.O.:310; I.V.:318.9] Out: -  Intake/Output from this shift: Total I/O In: 14.5 [I.V.:14.5] Out: -   Physical Exam: Neck: no adenopathy, no carotid bruit, no JVD and supple, symmetrical, trachea midline Lungs: decreased breath sounds at bases Heart: regular rate and rhythm, S1, S2 normal and soft systolic murmur noted Abdomen: soft, non-tender; bowel sounds normal; no masses,  no organomegaly Extremities: 1+ edema noted  Lab Results: Recent Labs    02/09/18 0437 02/09/18 1232  WBC 8.3 8.7  HGB 11.6* 10.6*  PLT 233 198   Recent Labs    02/09/18 0437 02/09/18 1232  NA 136 134*  K 5.3* 5.1  CL 95* 95*  CO2 24 24  GLUCOSE 90 87  BUN 46* 46*  CREATININE 8.77* 9.24*   Recent Labs    02/09/18 1232 02/09/18 1657  TROPONINI 3.03* 4.82*   Hepatic Function Panel Recent Labs    02/09/18 1232  PROT 6.7  ALBUMIN 3.6  AST 19  ALT 11*  ALKPHOS 47  BILITOT 0.7   No results for input(s): CHOL in the last 72 hours. No results for input(s): PROTIME in the last 72 hours.  Imaging: Imaging results have been reviewed and Ct Angio Chest Pe W And/or Wo Contrast  Result Date: 02/09/2018 CLINICAL DATA:  Shortness of breath. Left-sided chest pain. Dialysis patient. EXAM: CT ANGIOGRAPHY CHEST WITH CONTRAST TECHNIQUE: Multidetector CT imaging of the chest was performed using the standard protocol during bolus administration of intravenous contrast. Multiplanar CT image reconstructions and MIPs were obtained to evaluate the vascular anatomy. CONTRAST:  171mL ISOVUE-370  IOPAMIDOL (ISOVUE-370) INJECTION 76% COMPARISON:  Chest radiograph earlier this day.  No prior chest CT. FINDINGS: Cardiovascular: There are no filling defects within the pulmonary arteries to suggest pulmonary embolus. Subsegmental branches not well assessed due to contrast bolus timing. Dilated main pulmonary artery measuring 4.1 cm. Dense contrast in the left upper extremity veins and prominent chest wall collaterals. Stenosis/occlusion of the left brachiocephalic vein with calcifications. Right internal jugular dialysis catheter tip in the right atrium. Right axillary vascular stent, patency not well assessed on the current exam. Age advanced aortic atherosclerosis. Age advanced coronary artery calcifications. Pericardial thickening/small pericardial effusion. Contrast refluxes into the hepatic veins and IVC. Mediastinum/Nodes: Prominent mediastinal collaterals. No adenopathy. Esophagus is decompressed. Post thyroidectomy. Lungs/Pleura: Mild heterogeneous attenuation of lung parenchyma with mild diffuse ground-glass opacities. No confluent consolidation. No septal thickening. No pleural effusion. Trachea and mainstem bronchi are patent. Upper Abdomen: Hyperenhancement along the falciform ligament likely transient hepatic attenuation difference. Bilateral renal atrophy and cystic change. Prominent abdominal wall collaterals. Musculoskeletal: Mild diffuse increased bone mineral density consistent with renal osteodystrophy. There are no acute or suspicious osseous abnormalities. Review of the MIP images confirms the above findings. IMPRESSION: 1. No central pulmonary embolus. 2. Dilated main pulmonary artery suggesting pulmonary arterial hypertension. 3. Multi chamber cardiomegaly. Age advanced coronary artery calcifications and aortic atherosclerosis. Contrast refluxing into the hepatic veins and IVC consistent with elevated right heart pressures. 4. Mild heterogeneous attenuation of lung parenchyma with  ground-glass opacities, may be  small vessel or small airways disease. No septal thickening to suggest pulmonary edema. 5. 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 Suspect SVC stenosis, with right internal jugular dialysis catheter in place. Aortic Atherosclerosis (ICD10-I70.0). Electronically Signed   By: Jeb Levering M.D.   On: 02/09/2018 06:50   Dg Chest Port 1 View  Result Date: 02/09/2018 CLINICAL DATA:  Shortness of breath EXAM: PORTABLE CHEST 1 VIEW COMPARISON:  Chest radiograph 09/10/2016 FINDINGS: Right IJ approach hemodialysis catheter tip is in the right atrium. Mild cardiomegaly with pulmonary vascular congestion. No overt pulmonary edema. No focal consolidation. No pleural effusion or pneumothorax. IMPRESSION: Mild cardiomegaly with pulmonary vascular congestion but no overt pulmonary edema. Electronically Signed   By: Ulyses Jarred M.D.   On: 02/09/2018 05:32    Cardiac Studies:  Assessment/Plan:  Acute non-Q-wave myocardial infarction Coronary artery disease history of PCI in remote past Uncontrolled hypertension History of nephrotic syndrome End-stage renal disease on hemodialysis History of Hashimoto thyroiditis Hypothyroidism Morbid obesity GERD Possible catheter related sepsis Plan Discussed with patient regarding left cardiac catheterization, possible PTCA stenting.  This risk and benefits, I.e., death, MI, stroke, need for emergency CABG, local vascular complications, etc., and consents for PCI.  Patient scheduled by IR to change dialysis catheter in a.m. Okay to transfer to stepdown unit   LOS: 1 day    Charolette Forward 02/10/2018, 10:02 AM

## 2018-02-10 NOTE — Progress Notes (Signed)
[place] for Heparin Indication: chest pain/ACS/STEMI  Allergies  Allergen Reactions  . Bee Pollen Anaphylaxis  . Activase [Alteplase] Other (See Comments)    dyspnea  . Warfarin Sodium Nausea And Vomiting and Rash    Patient Measurements: Height: 5\' 3"  (160 cm) Weight: 233 lb 7.5 oz (105.9 kg) IBW/kg (Calculated) : 52.4 Heparin Dosing Weight: 77.7 kg  Vital Signs: Temp: 97.5 F (36.4 C) (03/18 2126) Temp Source: Oral (03/18 2126) BP: 124/85 (03/18 2231) Pulse Rate: 67 (03/18 2231)  Labs: Recent Labs    02/09/18 0437 02/09/18 0733 02/09/18 1232 02/09/18 1657 02/10/18 0137 02/10/18 1630 02/10/18 2204  HGB 11.6*  --  10.6*  --   --  10.5*  --   HCT 36.9  --  32.9*  --   --  32.5*  --   PLT 233  --  198  --   --  198  --   APTT  --  32  --   --   --   --   --   LABPROT  --  14.4  --   --   --   --   --   INR  --  1.13  --   --   --   --   --   HEPARINUNFRC  --   --   --  <0.10* 0.16*  --  0.43  CREATININE 8.77*  --  9.24*  --   --  11.49*  --   TROPONINI 2.59*  --  3.03* 4.82*  --  12.28*  --     Estimated Creatinine Clearance: 7 mL/min (A) (by C-G formula based on SCr of 11.49 mg/dL (H)).  Assessment: 72 YOF transferred from [facility] on heparin for ACS, possible cath Tuesday. Has hx of ESRD - not on anticoag PTA.  Heparin level this evening came back therapeutic at 0.43, on 1450 units/hr. No signs/symptoms of bleeding. No infusion issues per nursing. Hgb is stable at 10.5, plts WNL at 198.    Goal of Therapy:  Heparin level 0.3-0.7 units/ml Monitor platelets by anticoagulation protocol: Yes   Plan:  Continue heparin infusion at 1450 units/hr Monitor daily HL and CBC  Monitor for signs/symptoms of bleeding F/u after LHC tomorrow  Doylene Canard, PharmD Clinical Pharmacist  Pager: [phone] Phone: [phone]

## 2018-02-10 NOTE — Consult Note (Signed)
[place] Nurse wound consult note Reason for Consult: Intertriginous dermatitis to abdominal pannus.  Tender to touch, erythematous and weeping Wound type:moisture associated skin damage (MASD) abdominal skin fold Pressure Injury POA: NA Measurement: 4 cm x 7 cm x 0.1 cm  Wound UOH:FGBM moist and weeping Drainage (amount, consistency, odor) scant weeping Periwound:intact  Skin fold Dressing procedure/placement/frequency: To abdominal skin fold Measure and cut length of InterDry Ag+ to fit in skin folds that have skin breakdown Tuck InterDry  Ag+ fabric into skin folds in a single layer, allow for 2 inches of overhang from skin edges to allow for wicking to occur May remove to bathe; dry area thoroughly and then tuck into affected areas again Do not apply any creams or ointments when using InterDry Ag+ DO NOT THROW AWAY FOR 5 DAYS unless soiled with stool DO NOT [facility] product, this will inactivate the silver in the material  New sheet of Interdry Ag+ should be applied after 5 days of use if patient continues to have skin breakdown   Will not follow at this time.  Please re-consult if needed.  Domenic Moras RN BSN [place] Pager [phone]

## 2018-02-10 NOTE — Progress Notes (Addendum)
Initial Nutrition Assessment  DOCUMENTATION CODES:   Morbid obesity  INTERVENTION:    RD to encourage high protein food items at each meal  Daily snacks  NUTRITION DIAGNOSIS:   Increased nutrient needs related to chronic illness(ESRD on HD) as evidenced by estimated needs.  GOAL:   Patient will meet greater than or equal to 90% of their needs  MONITOR:   PO intake, Supplement acceptance, Weight trends, Labs  REASON FOR ASSESSMENT:   Malnutrition Screening Tool    ASSESSMENT:   Patient with PMH significant for ASCVD, ESRD on HD, nephrotic syndrome, calciphylaxis, and HTN. Presents this admission with left sided chest pain and shortness of breath. Transferred from [place] for non-Q-wave myocardial infarction. Plan for left cardiac catheterization 3/19.    Spoke with pt at bedside. Denies any loss in appetite PTA. States she eats three small meals per day that consist of a meat (usually chicken), a vegetable, and a grain. Pt is followed by Dialysis RD in [place]. Pt is aware of the importance protein plays in ESRD with dialysis. She tries to eat the meat on her plate first before she gets too full. Pt had pancakes, fruit, and coffee for breakfast without any complication. Pt used to consume protein bars at HD outpatient center until they stopped buying them. She dislikes all flavors of Nepro. Refuses supplements this hospital stay.   Pt endorses a dry weight of 105 kg (231 lb), and denies any recent wt loss. Records are limited in weight history but show no weight loss over the last three years. Nutrition-Focused physical exam completed.   Medications reviewed and include: Rena-Vit, Renvela, Vit D Labs reviewed: Na 134 (L) Creatinine 9.24 (H)   NUTRITION - FOCUSED PHYSICAL EXAM:    Most Recent Value  Orbital Region  No depletion  Upper Arm Region  No depletion  Thoracic and Lumbar Region  Unable to assess  Buccal Region  No depletion  Temple Region  No depletion   Clavicle Bone Region  No depletion  Clavicle and Acromion Bone Region  No depletion  Scapular Bone Region  Unable to assess  Dorsal Hand  No depletion  Patellar Region  No depletion  Anterior Thigh Region  No depletion  Posterior Calf Region  No depletion  Edema (RD Assessment)  Mild      Diet Order:  Diet renal with fluid restriction Fluid restriction: 1200 mL Fluid; Room service appropriate? Yes; Fluid consistency: Thin  EDUCATION NEEDS:   Education needs have been addressed  Skin:  Skin Assessment: Reviewed RN Assessment  Last BM:  02/08/18  Height:   Ht Readings from Last 1 Encounters:  02/09/18 5\' 3"  (1.6 m)    Weight:   Wt Readings from Last 1 Encounters:  02/09/18 241 lb 13.5 oz (109.7 kg)    Ideal Body Weight:  52.2 kg  BMI:  Body mass index is 42.84 kg/m.  Estimated Nutritional Needs:   Kcal:  2000-2200 kcal/day  Protein:  115-125 g/kg  Fluid:  1200 ml fluid restriction    Mariana Single RD, LDN Clinical Nutrition Pager # - [phone]

## 2018-02-11 ENCOUNTER — Inpatient Hospital Stay (HOSPITAL_COMMUNITY)
Admission: EM | Disposition: A | Discharge status: Home / Self Care | Payer: Self-pay | Source: Home / Self Care | Attending: Cardiology

## 2018-02-11 ENCOUNTER — Encounter (HOSPITAL_COMMUNITY): Payer: Self-pay | Admitting: Cardiology

## 2018-02-11 HISTORY — PX: LEFT HEART CATH AND CORONARY ANGIOGRAPHY: CATH118249

## 2018-02-11 LAB — HIV ANTIBODY (ROUTINE TESTING W REFLEX): HIV Screen 4th Generation wRfx: NONREACTIVE

## 2018-02-11 LAB — BASIC METABOLIC PANEL
Anion gap: 14 (ref 5–15)
Anion gap: 11 (ref 5–15)
BUN: 30 mg/dL — ABNORMAL HIGH (ref 6–20)
BUN: 37 mg/dL — ABNORMAL HIGH (ref 6–20)
CO2: 24 mmol/L (ref 22–32)
CO2: 23 mmol/L (ref 22–32)
Calcium: 7.9 mg/dL — ABNORMAL LOW (ref 8.9–10.3)
Calcium: 7.6 mg/dL — ABNORMAL LOW (ref 8.9–10.3)
Chloride: 94 mmol/L — ABNORMAL LOW (ref 101–111)
Chloride: 98 mmol/L — ABNORMAL LOW (ref 101–111)
Creatinine, Ser: 7.12 mg/dL — ABNORMAL HIGH (ref 0.44–1.00)
Creatinine, Ser: 7.92 mg/dL — ABNORMAL HIGH (ref 0.44–1.00)
GFR calc Af Amer: 7 mL/min — ABNORMAL LOW (ref >60)
GFR calc Af Amer: 6 mL/min — ABNORMAL LOW (ref >60)
GFR calc non Af Amer: 6 mL/min — ABNORMAL LOW (ref >60)
GFR calc non Af Amer: 5 mL/min — ABNORMAL LOW (ref >60)
Glucose, Bld: 98 mg/dL (ref 65–99)
Glucose, Bld: 109 mg/dL — ABNORMAL HIGH (ref 65–99)
Potassium: 4.4 mmol/L (ref 3.5–5.1)
Potassium: 4.6 mmol/L (ref 3.5–5.1)
Sodium: 132 mmol/L — ABNORMAL LOW (ref 135–145)
Sodium: 132 mmol/L — ABNORMAL LOW (ref 135–145)

## 2018-02-11 LAB — CBC
HCT: 34 % — ABNORMAL LOW (ref 36.0–46.0)
Hemoglobin: 10.8 g/dL — ABNORMAL LOW (ref 12.0–15.0)
MCH: 31 pg (ref 26.0–34.0)
MCHC: 31.8 g/dL (ref 30.0–36.0)
MCV: 97.7 fL (ref 78.0–100.0)
Platelets: 192 10*3/uL (ref 150–400)
RBC: 3.48 MIL/uL — ABNORMAL LOW (ref 3.87–5.11)
RDW: 15 % (ref 11.5–15.5)
WBC: 9.2 10*3/uL (ref 4.0–10.5)

## 2018-02-11 LAB — MAGNESIUM: Magnesium: 2 mg/dL (ref 1.7–2.4)

## 2018-02-11 LAB — POCT ACTIVATED CLOTTING TIME
Activated Clotting Time: 279 seconds
Activated Clotting Time: 356 seconds
Activated Clotting Time: 389 seconds
Activated Clotting Time: 208 seconds
Activated Clotting Time: 202 seconds
Activated Clotting Time: 180 seconds
Activated Clotting Time: 180 seconds
Activated Clotting Time: 175 seconds
Activated Clotting Time: 169 seconds

## 2018-02-11 LAB — HEPARIN LEVEL (UNFRACTIONATED): Heparin Unfractionated: 0.14 IU/mL — ABNORMAL LOW (ref 0.30–0.70)

## 2018-02-11 SURGERY — LEFT HEART CATH AND CORONARY ANGIOGRAPHY
Anesthesia: LOCAL

## 2018-02-11 MED ORDER — SODIUM CHLORIDE 0.9 % IV SOLN: INTRAVENOUS | Status: DC

## 2018-02-11 MED ORDER — ASPIRIN 81 MG PO CHEW
81.0000 mg | CHEWABLE_TABLET | ORAL | Status: AC
  Administered 2018-02-11: 81 mg via ORAL
  Filled 2018-02-11: qty 1

## 2018-02-11 MED ORDER — CLOPIDOGREL BISULFATE 300 MG PO TABS
ORAL_TABLET | ORAL | Status: DC | PRN
  Administered 2018-02-11: 300 mg via ORAL

## 2018-02-11 MED ORDER — NITROGLYCERIN 1 MG/10 ML FOR IR/CATH LAB
INTRA_ARTERIAL | Status: DC | PRN
  Administered 2018-02-11: 100 ug via INTRACORONARY
  Administered 2018-02-11 (×2): 150 ug via INTRACORONARY

## 2018-02-11 MED ORDER — IOPAMIDOL (ISOVUE-370) INJECTION 76%
INTRAVENOUS | Status: AC
  Filled 2018-02-11: qty 100

## 2018-02-11 MED ORDER — METOPROLOL TARTRATE 12.5 MG HALF TABLET: 12.5000 mg | ORAL_TABLET | Freq: Two times a day (BID) | ORAL | Status: DC

## 2018-02-11 MED ORDER — SODIUM CHLORIDE 0.9% FLUSH
3.0000 mL | INTRAVENOUS | Status: DC | PRN
Start: 2018-02-11 — End: 2018-02-11

## 2018-02-11 MED ORDER — TIROFIBAN HCL IN NACL 5-0.9 MG/100ML-% IV SOLN
INTRAVENOUS | Status: DC | PRN
  Administered 2018-02-11: 0.075 ug/kg/min via INTRAVENOUS

## 2018-02-11 MED ORDER — NITROGLYCERIN 1 MG/10 ML FOR IR/CATH LAB
INTRA_ARTERIAL | Status: AC
  Filled 2018-02-11: qty 10

## 2018-02-11 MED ORDER — SODIUM CHLORIDE 0.9 % IV SOLN
250.0000 mL | INTRAVENOUS | Status: DC | PRN
Start: 2018-02-11 — End: 2018-02-15

## 2018-02-11 MED ORDER — TIROFIBAN HCL IN NACL 5-0.9 MG/100ML-% IV SOLN
INTRAVENOUS | Status: AC
  Filled 2018-02-11: qty 100

## 2018-02-11 MED ORDER — LIDOCAINE HCL 1 % IJ SOLN
INTRAMUSCULAR | Status: AC
  Filled 2018-02-11: qty 20

## 2018-02-11 MED ORDER — SODIUM CHLORIDE 0.9% FLUSH: 3.0000 mL | Freq: Two times a day (BID) | INTRAVENOUS | Status: DC

## 2018-02-11 MED ORDER — FAMOTIDINE IN NACL 20-0.9 MG/50ML-% IV SOLN
INTRAVENOUS | Status: AC
  Filled 2018-02-11: qty 50

## 2018-02-11 MED ORDER — ACETAMINOPHEN 325 MG PO TABS
650.0000 mg | ORAL_TABLET | Freq: Four times a day (QID) | ORAL | Status: DC | PRN
  Administered 2018-02-11: 650 mg via ORAL
  Filled 2018-02-11: qty 2

## 2018-02-11 MED ORDER — SODIUM CHLORIDE 0.9% FLUSH
3.0000 mL | Freq: Two times a day (BID) | INTRAVENOUS | Status: DC
  Administered 2018-02-11 – 2018-02-13 (×3): 3 mL via INTRAVENOUS

## 2018-02-11 MED ORDER — SODIUM CHLORIDE 0.9 % IV SOLN: 250.0000 mL | INTRAVENOUS | Status: DC | PRN

## 2018-02-11 MED ORDER — HEPARIN (PORCINE) IN NACL 2-0.9 UNIT/ML-% IJ SOLN
INTRAMUSCULAR | Status: AC
  Filled 2018-02-11: qty 1000

## 2018-02-11 MED ORDER — MIDAZOLAM HCL 2 MG/2ML IJ SOLN
INTRAMUSCULAR | Status: AC
Start: 2018-02-11 — End: 2018-02-11
  Filled 2018-02-11: qty 2

## 2018-02-11 MED ORDER — FENTANYL CITRATE (PF) 100 MCG/2ML IJ SOLN
INTRAMUSCULAR | Status: AC
  Filled 2018-02-11: qty 2

## 2018-02-11 MED ORDER — CLOPIDOGREL BISULFATE 300 MG PO TABS
ORAL_TABLET | ORAL | Status: AC
  Filled 2018-02-11: qty 1

## 2018-02-11 MED ORDER — SODIUM CHLORIDE 0.9 % IV SOLN
INTRAVENOUS | Status: DC
  Administered 2018-02-11: 06:00:00 via INTRAVENOUS

## 2018-02-11 MED ORDER — BIVALIRUDIN TRIFLUOROACETATE 250 MG IV SOLR
INTRAVENOUS | Status: AC
  Filled 2018-02-11: qty 250

## 2018-02-11 MED ORDER — LIDOCAINE HCL (PF) 1 % IJ SOLN
INTRAMUSCULAR | Status: DC | PRN
  Administered 2018-02-11: 10 mL
  Administered 2018-02-11: 20 mL

## 2018-02-11 MED ORDER — BIVALIRUDIN TRIFLUOROACETATE 250 MG IV SOLR
INTRAVENOUS | Status: DC | PRN
  Administered 2018-02-11: 1 mg/kg/h via INTRAVENOUS
  Administered 2018-02-11: 0.25 mg/kg/h via INTRAVENOUS

## 2018-02-11 MED ORDER — IODIXANOL 320 MG/ML IV SOLN
INTRAVENOUS | Status: DC | PRN
  Administered 2018-02-11: 175 mL via INTRA_ARTERIAL

## 2018-02-11 MED ORDER — BIVALIRUDIN BOLUS VIA INFUSION - CUPID
INTRAVENOUS | Status: DC | PRN
  Administered 2018-02-11: 81.975 mg via INTRAVENOUS

## 2018-02-11 MED ORDER — SODIUM CHLORIDE 0.9% FLUSH: 3.0000 mL | INTRAVENOUS | Status: DC | PRN

## 2018-02-11 MED ORDER — MIDAZOLAM HCL 2 MG/2ML IJ SOLN
INTRAMUSCULAR | Status: DC | PRN
  Administered 2018-02-11: 1 mg via INTRAVENOUS

## 2018-02-11 MED ORDER — FAMOTIDINE IN NACL 20-0.9 MG/50ML-% IV SOLN
INTRAVENOUS | Status: AC | PRN
  Administered 2018-02-11: 20 mg via INTRAVENOUS

## 2018-02-11 MED ORDER — TIROFIBAN (AGGRASTAT) BOLUS VIA INFUSION
INTRAVENOUS | Status: DC | PRN
  Administered 2018-02-11: 2732.5 ug via INTRAVENOUS

## 2018-02-11 MED ORDER — HEPARIN (PORCINE) IN NACL 2-0.9 UNIT/ML-% IJ SOLN
INTRAMUSCULAR | Status: AC | PRN
  Administered 2018-02-11 (×3): 500 mL via INTRA_ARTERIAL

## 2018-02-11 MED ORDER — FENTANYL CITRATE (PF) 100 MCG/2ML IJ SOLN
INTRAMUSCULAR | Status: DC | PRN
  Administered 2018-02-11: 25 ug via INTRAVENOUS
  Administered 2018-02-11: 50 ug via INTRAVENOUS
  Administered 2018-02-11 (×2): 25 ug via INTRAVENOUS

## 2018-02-11 MED ORDER — IOPAMIDOL (ISOVUE-370) INJECTION 76%
INTRAVENOUS | Status: AC
  Filled 2018-02-11: qty 50

## 2018-02-11 SURGICAL SUPPLY — 41 items
BALLN EMERGE MR 2.0X12 (BALLOONS) ×2
BALLN EMERGE MR 2.0X8 (BALLOONS) ×2
BALLN EMERGE MR 2.5X12 (BALLOONS) ×2
BALLN EMERGE MR 2.5X20 (BALLOONS) ×6
BALLN EMERGE MR 3.0X12 (BALLOONS) ×4
BALLN SAPPHIRE 1.0X10 (BALLOONS) ×2
BALLN SAPPHIRE 1.5X20 (BALLOONS) ×2
BALLN ~~LOC~~ EMERGE MR 2.5X12 (BALLOONS) ×2
BALLN ~~LOC~~ EMERGE MR 3.5X12 (BALLOONS) ×2
BALLN ~~LOC~~ EMERGE MR 3.5X20 (BALLOONS) ×2
BALLOON EMERGE MR 2.0X12 (BALLOONS) ×1 IMPLANT
BALLOON EMERGE MR 2.0X8 (BALLOONS) ×1 IMPLANT
BALLOON EMERGE MR 2.5X12 (BALLOONS) ×1 IMPLANT
BALLOON EMERGE MR 2.5X20 (BALLOONS) ×3 IMPLANT
BALLOON EMERGE MR 3.0X12 (BALLOONS) ×2 IMPLANT
BALLOON SAPPHIRE 1.0X10 (BALLOONS) ×1 IMPLANT
BALLOON SAPPHIRE 1.5X20 (BALLOONS) ×1 IMPLANT
BALLOON ~~LOC~~ EMERGE MR 2.5X12 (BALLOONS) ×1 IMPLANT
BALLOON ~~LOC~~ EMERGE MR 3.5X12 (BALLOONS) ×1 IMPLANT
BALLOON ~~LOC~~ EMERGE MR 3.5X20 (BALLOONS) ×1 IMPLANT
CATH INFINITI 5FR MULTPACK ANG (CATHETERS) ×2 IMPLANT
CATH LAUNCHER 6FR AL.75 (CATHETERS) ×2 IMPLANT
CATH VISTA GUIDE 6FR JR4 (CATHETERS) ×2 IMPLANT
ELECT DEFIB PAD ADLT CADENCE (PAD) ×2 IMPLANT
GUIDELINER 6F (CATHETERS) ×2 IMPLANT
KIT ENCORE 26 ADVANTAGE (KITS) ×2 IMPLANT
KIT HEART LEFT (KITS) ×2 IMPLANT
PACK CARDIAC CATHETERIZATION (CUSTOM PROCEDURE TRAY) ×2 IMPLANT
SHEATH AVANTI 11CM 5FR (SHEATH) ×2 IMPLANT
SHEATH AVANTI 11CM 6FR (SHEATH) ×2 IMPLANT
STENT SIERRA 3.00 X 15 MM (Permanent Stent) ×2 IMPLANT
STENT SIERRA 3.50 X 23 MM (Permanent Stent) ×2 IMPLANT
STENT SIERRA 3.50 X 28 MM (Permanent Stent) ×2 IMPLANT
SYR MEDRAD MARK V 150ML (SYRINGE) ×2 IMPLANT
TRANSDUCER W/STOPCOCK (MISCELLANEOUS) ×2 IMPLANT
TUBING CIL FLEX 10 FLL-RA (TUBING) ×2 IMPLANT
WIRE EMERALD 3MM-J .035X150CM (WIRE) ×2 IMPLANT
WIRE MAILMAN 182CM (WIRE) ×2 IMPLANT
WIRE MAILMAN 300CM (WIRE) IMPLANT
WIRE PT2 MS 185 (WIRE) ×2 IMPLANT
WIRE RUNTHROUGH .014X180CM (WIRE) ×2 IMPLANT

## 2018-02-11 NOTE — Plan of Care (Signed)
Progressing

## 2018-02-11 NOTE — Progress Notes (Signed)
[facility] Progress Note  Subjective: went for heart cath, results pend, per RN had a stent placed.  No sob or orthopnea after cath.   Vitals:   02/11/18 1515 02/11/18 1516 02/11/18 1517 02/11/18 1518  BP:      Pulse: 65   65  Resp: 14 18 19 16   Temp:      TempSrc:      SpO2: 100%   99%  Weight:      Height:        Inpatient medications: . aspirin EC  81 mg Oral Daily  . atorvastatin  80 mg Oral q1800  . clopidogrel  75 mg Oral Q breakfast  . levothyroxine  300 mcg Oral QHS  . metoprolol tartrate  12.5 mg Oral BID  . multivitamin  1 tablet Oral QHS  . pantoprazole  40 mg Oral Daily  . sevelamer carbonate  2,400 mg Oral With snacks  . sevelamer carbonate  3,200 mg Oral TID WC  . sodium chloride flush  3 mL Intravenous Q12H  . Vitamin D (Ergocalciferol)  50,000 Units Oral Weekly   . sodium chloride    . sodium chloride    . nitroGLYCERIN 25 mcg/min (02/11/18 1525)   sodium chloride, acetaminophen, albuterol, nitroGLYCERIN, ondansetron (ZOFRAN) IV, sodium chloride flush, traMADol  Exam: Chron ill appearing WF, no distress No jVd Chest clear bilat, no rales or wheezing Cor RRR 2/6 sem, no RG Abd soft obese ntnd no hsm Ext 1+ bilat LE edema NF Ox 3 L IJ TDC site clearn  Dialysis: MWF [facility] 4h  400/800  105kg  2/2.25 bath  Hep 10K -calcitriol 0.5 ug tiw - BMD meds: Renvela 800 mg 4 tabs PO TID AC 2 tabs with snacks - last Labs: HGB 10.7 (02/05/18)  Tsat 50 Fe 120 Ca 9.8 C Ca 9.5 Phos 8.1 PTH 555       Impression: 1  NSTEMI - sp LHC today 2  ESRD HD MWF   3  Possible TDC infection - blood cx's negative, do not need abx for this 4  HTN/ vol - up 4-5 kg, on metop only , bp's stable 5  Anemia - Hb 11, no esa  6  MBD ckd - cont meds 7  Nutrition - cont rena vite, renal diet   Plan - HD Wed, max UF 4 L as Ronnie Derby MD [facility] pager [phone]   02/11/2018, 4:10 PM   Recent Labs  Lab 02/10/18 1630 02/10/18 2204  02/11/18 0424  NA 133* 132* 132*  K 5.7* 3.9 4.4  CL 93* 96* 94*  CO2 24 25 24   GLUCOSE 106* 92 98  BUN 64* 24* 30*  CREATININE 11.49* 6.05* 7.12*  CALCIUM 7.6* 7.7* 7.9*  PHOS  --  4.2  --    Recent Labs  Lab 02/09/18 1232 02/10/18 2204  AST 19  --   ALT 11*  --   ALKPHOS 47  --   BILITOT 0.7  --   PROT 6.7  --   ALBUMIN 3.6 3.4*   Recent Labs  Lab 02/09/18 0437 02/09/18 1232 02/10/18 1630 02/11/18 0424  WBC 8.3 8.7 10.5 9.2  NEUTROABS 5.5 6.6  --   --   HGB 11.6* 10.6* 10.5* 10.8*  HCT 36.9 32.9* 32.5* 34.0*  MCV 97.6 96.8 97.3 97.7  PLT 233 198 198 192   Iron/TIBC/Ferritin/ %Sat No results found for: IRON, TIBC, FERRITIN, IRONPCTSAT

## 2018-02-11 NOTE — Progress Notes (Signed)
ACT 169  Tamarcus Condie, Randye Lobo, RN

## 2018-02-11 NOTE — Progress Notes (Signed)
Patient received from 2 heart via bed. Patient is alert and oriented x4. Vital signs are stable. Patient denies for pain. Skin assessment done with another nurse. Patient is on step down with bed side monitor. Given instruction about call bell and phone. Bed in low position and side rail up x2. Call bell in reach.

## 2018-02-11 NOTE — Progress Notes (Signed)
[place] for Heparin Indication: chest pain/ACS/STEMI  Allergies  Allergen Reactions  . Bee Pollen Anaphylaxis  . Activase [Alteplase] Other (See Comments)    dyspnea  . Warfarin Sodium Nausea And Vomiting and Rash    Patient Measurements: Height: 5\' 3"  (160 cm) Weight: 236 lb 8.9 oz (107.3 kg) IBW/kg (Calculated) : 52.4 Heparin Dosing Weight: 77.7 kg  Vital Signs: Temp: 97.8 F (36.6 C) (03/19 0033) Temp Source: Oral (03/19 0033) BP: 136/86 (03/19 0033) Pulse Rate: 62 (03/19 0033)  Labs: Recent Labs    02/09/18 0437 02/09/18 0733 02/09/18 1232  02/09/18 1657 02/10/18 0137 02/10/18 1630 02/10/18 2204 02/11/18 0424  HGB 11.6*  --  10.6*  --   --   --  10.5*  --   --   HCT 36.9  --  32.9*  --   --   --  32.5*  --   --   PLT 233  --  198  --   --   --  198  --   --   APTT  --  32  --   --   --   --   --   --   --   LABPROT  --  14.4  --   --   --   --   --   --   --   INR  --  1.13  --   --   --   --   --   --   --   HEPARINUNFRC  --   --   --    < > <0.10* 0.16*  --  0.43 0.14*  CREATININE 8.77*  --  9.24*  --   --   --  11.49* 6.05* 7.12*  TROPONINI 2.59*  --  3.03*  --  4.82*  --  12.28*  --   --    < > = values in this interval not displayed.    Estimated Creatinine Clearance: 11.3 mL/min (A) (by C-G formula based on SCr of 7.12 mg/dL (H)).  Assessment: 47 y.o. female with chest pain for heparin   Goal of Therapy:  Heparin level 0.3-0.7 units/ml Monitor platelets by anticoagulation protocol: Yes   Plan:  Increase Heparin 1600 units/hr Follow up after cath today   Phillis Knack, PharmD, BCPS

## 2018-02-11 NOTE — Progress Notes (Signed)
Pt wit frequent PVC's. Terrence Dupont, MD notified and BMET, [place] labs ordered as well as verbal order for Metoprolol 12.5 BID. Will get labs, give metoprolol as ordered and monitor closely.  Lucius Conn, RN

## 2018-02-11 NOTE — Progress Notes (Signed)
ACT = 202 at 1900.... Reported to [facility] during shift change.

## 2018-02-11 NOTE — Progress Notes (Signed)
ACT 175 @ 2200. Will recheck in 1 hour.  Sherlie Ban, RN

## 2018-02-11 NOTE — Interval H&P Note (Signed)
Cath Lab Visit (complete for each Cath Lab visit)  Clinical Evaluation Leading to the Procedure:   ACS: Yes.    Non-ACS:    Anginal Classification: CCS IV  Anti-ischemic medical therapy: Maximal Therapy (2 or more classes of medications)  Non-Invasive Test Results: No non-invasive testing performed  Prior CABG: No previous CABG      History and Physical Interval Note:  02/11/2018 8:58 AM  Lori [name]  has presented today for surgery, with the diagnosis of n stemi  The various methods of treatment have been discussed with the patient and family. After consideration of risks, benefits and other options for treatment, the patient has consented to  Procedure(s): LEFT HEART CATH AND CORONARY ANGIOGRAPHY (N/A) as a surgical intervention .  The patient's history has been reviewed, patient examined, no change in status, stable for surgery.  I have reviewed the patient's chart and labs.  Questions were answered to the patient's satisfaction.     Charolette Forward

## 2018-02-11 NOTE — Progress Notes (Signed)
ACT 180 @ 2000.  Sherlie Ban, RN

## 2018-02-11 NOTE — Progress Notes (Signed)
ACT = 208 at 1800. Will check in 1 hour. Oswaldo Conroy, RN

## 2018-02-11 NOTE — Progress Notes (Signed)
ACT 180 @ 2100. Will recheck @ 2200.  Sherlie Ban, RN

## 2018-02-12 LAB — CBC
HCT: 29.9 % — ABNORMAL LOW (ref 36.0–46.0)
Hemoglobin: 9.3 g/dL — ABNORMAL LOW (ref 12.0–15.0)
MCH: 30.1 pg (ref 26.0–34.0)
MCHC: 31.1 g/dL (ref 30.0–36.0)
MCV: 96.8 fL (ref 78.0–100.0)
Platelets: 223 10*3/uL (ref 150–400)
RBC: 3.09 MIL/uL — ABNORMAL LOW (ref 3.87–5.11)
RDW: 15.2 % (ref 11.5–15.5)
WBC: 8.8 10*3/uL (ref 4.0–10.5)

## 2018-02-12 LAB — BASIC METABOLIC PANEL
Anion gap: 11 (ref 5–15)
BUN: 44 mg/dL — ABNORMAL HIGH (ref 6–20)
CO2: 24 mmol/L (ref 22–32)
Calcium: 7.8 mg/dL — ABNORMAL LOW (ref 8.9–10.3)
Chloride: 97 mmol/L — ABNORMAL LOW (ref 101–111)
Creatinine, Ser: 8.73 mg/dL — ABNORMAL HIGH (ref 0.44–1.00)
GFR calc Af Amer: 6 mL/min — ABNORMAL LOW (ref >60)
GFR calc non Af Amer: 5 mL/min — ABNORMAL LOW (ref >60)
Glucose, Bld: 91 mg/dL (ref 65–99)
Potassium: 5.1 mmol/L (ref 3.5–5.1)
Sodium: 132 mmol/L — ABNORMAL LOW (ref 135–145)

## 2018-02-12 LAB — POCT ACTIVATED CLOTTING TIME
Activated Clotting Time: 147 seconds
Activated Clotting Time: 164 seconds
Activated Clotting Time: 180 seconds

## 2018-02-12 MED ORDER — LOSARTAN POTASSIUM 25 MG PO TABS
12.5000 mg | ORAL_TABLET | Freq: Every day | ORAL | Status: DC
  Administered 2018-02-13: 12.5 mg via ORAL
  Filled 2018-02-12 (×2): qty 1

## 2018-02-12 MED ORDER — ORAL CARE MOUTH RINSE: 15.0000 mL | Freq: Two times a day (BID) | OROMUCOSAL | Status: DC

## 2018-02-12 MED ORDER — MORPHINE SULFATE (PF) 2 MG/ML IV SOLN
2.0000 mg | Freq: Once | INTRAVENOUS | Status: AC
  Administered 2018-02-12: 2 mg via INTRAVENOUS

## 2018-02-12 MED ORDER — MORPHINE SULFATE (PF) 2 MG/ML IV SOLN
INTRAVENOUS | Status: AC
  Filled 2018-02-12: qty 1

## 2018-02-12 MED FILL — Lidocaine HCl Local Inj 1%: INTRAMUSCULAR | Qty: 40 | Status: AC

## 2018-02-12 NOTE — Progress Notes (Signed)
Report given to dialysis nurse and informed them that she is on [facility] of Nitroglycerin and that she is to stay on in per Dr. Terrence Dupont.

## 2018-02-12 NOTE — Progress Notes (Signed)
Pt unable to tolerate pressure to femoral area; MD Harwani called, orders received for 2 mg morphine once. Will implement and continue to monitor.  Sherlie Ban, RN

## 2018-02-12 NOTE — Progress Notes (Signed)
Subjective:  Patient denies any chest pain or shortness of breath. Underwent complex PCI to RCA complicated by spiral dissection from proximal to mid RCA, which was successfully sealed by 3 long drug-eluting stents.Multiple inflations were done in small PDA branch of RCA with no improvement in blood flow. Patient also noted to have multivessel CAD in LAD and left circumflex and obtuse marginal branch discussed at length with patient regarding the cath findings and staging the procedure and relook angiogram tomorrow but states wants to wait a few weeks.   Objective:  Vital Signs in the last 24 hours: Temp:  [97.5 F (36.4 C)-98.4 F (36.9 C)] 98.3 F (36.8 C) (03/20 1100) Pulse Rate:  [0-80] 61 (03/20 1200) Resp:  [0-37] 12 (03/20 1200) BP: (96-183)/(66-142) 106/66 (03/20 1200) SpO2:  [0 %-100 %] 100 % (03/20 1200) Arterial Line BP: (104-173)/(59-95) 146/82 (03/20 0503)  Intake/Output from previous day: 03/19 0701 - 03/20 0700 In: 90 [I.V.:90] Out: -  Intake/Output from this shift: Total I/O In: 19.9 [I.V.:19.9] Out: -   Physical Exam: Neck: no adenopathy, no carotid bruit, no JVD and supple, symmetrical, trachea midline Lungs: clear to auscultation bilaterally Heart: regular rate and rhythm, S1, S2 normal and soft systolic murmur noted Abdomen: soft, non-tender; bowel sounds normal; no masses,  no organomegaly Extremities: extremities normal, atraumatic, no cyanosis or edema and right groin stable  Lab Results: Recent Labs    02/11/18 0424 02/12/18 0440  WBC 9.2 8.8  HGB 10.8* 9.3*  PLT 192 223   Recent Labs    02/11/18 1648 02/12/18 0440  NA 132* 132*  K 4.6 5.1  CL 98* 97*  CO2 23 24  GLUCOSE 109* 91  BUN 37* 44*  CREATININE 7.92* 8.73*   Recent Labs    02/09/18 1657 02/10/18 1630  TROPONINI 4.82* 12.28*   Hepatic Function Panel Recent Labs    02/09/18 1232 02/10/18 2204  PROT 6.7  --   ALBUMIN 3.6 3.4*  AST 19  --   ALT 11*  --   ALKPHOS 47  --    BILITOT 0.7  --    Recent Labs    02/10/18 1630  CHOL 117   No results for input(s): PROTIME in the last 72 hours.  Imaging: Imaging results have been reviewed and No results found.  Cardiac Studies:  Assessment/Plan:  Status post Acute non-Q-wave myocardial infarction Status post left cardiac catheterization/PCI to RCA complicated by spiral dissection requiring 3 stents from mid to proximal portion to tack up the dissection Multivessel CAD Ischemic cardiomyopathy Coronary artery disease history of PCI in remote past Uncontrolled hypertension History of nephrotic syndrome End-stage renal disease on hemodialysis History of Hashimoto thyroiditis Hypothyroidism Morbid obesity GERD Possible catheter related sepsis Plan Continue present management and patient scheduled for hemodialysis today Will and low-dose losartan as per orders  LOS: 3 days    Charolette Forward 02/12/2018, 12:10 PM

## 2018-02-12 NOTE — Progress Notes (Signed)
Left femoral arterial sheath removed at 0510 per order. Manual pressure applied for 25 mins. Vital signs stable throughout procedure. Patient educated about bedrest and bleeding precautions. Pressure dressing applied. Site is a level 0.

## 2018-02-12 NOTE — Progress Notes (Signed)
Dr. Terrence Dupont and Dr. Jonnie Finner are happy for her to go upstairs for dialysis.

## 2018-02-12 NOTE — Progress Notes (Addendum)
[facility] Progress Note  Subjective: had 3 new stents to RCA yesterday, stable today, no c/o  Vitals:   02/12/18 0615 02/12/18 0630 02/12/18 0700 02/12/18 0800  BP: (!) 125/95 110/74 121/80 129/82  Pulse: 61 (!) 58 62 (!) 59  Resp: 17 10 16 10   Temp:   (!) 97.5 F (36.4 C)   TempSrc:   Oral   SpO2: 100% 98% 98% 100%  Weight:      Height:        Inpatient medications: . aspirin EC  81 mg Oral Daily  . atorvastatin  80 mg Oral q1800  . clopidogrel  75 mg Oral Q breakfast  . levothyroxine  300 mcg Oral QHS  . mouth rinse  15 mL Mouth Rinse BID  . metoprolol tartrate  12.5 mg Oral BID  . multivitamin  1 tablet Oral QHS  . pantoprazole  40 mg Oral Daily  . sevelamer carbonate  2,400 mg Oral With snacks  . sevelamer carbonate  3,200 mg Oral TID WC  . sodium chloride flush  3 mL Intravenous Q12H  . Vitamin D (Ergocalciferol)  50,000 Units Oral Weekly   . sodium chloride    . sodium chloride    . nitroGLYCERIN 5 mcg/min (02/12/18 0825)   sodium chloride, acetaminophen, albuterol, nitroGLYCERIN, ondansetron (ZOFRAN) IV, sodium chloride flush, traMADol  Exam: Chron ill appearing WF, no distress No jVd Chest clear bilat, no rales or wheezing Cor RRR 2/6 sem, no RG Abd soft obese ntnd no hsm Ext 1+ bilat LE edema NF Ox 3 L IJ TDC site clearn  Dialysis: MWF [facility] 4h  400/800  105kg  2/2.25 bath  Hep 10K -calcitriol 0.5 ug tiw - BMD meds: Renvela 800 mg 4 tabs PO TID AC 2 tabs with snacks - last Labs: HGB 10.7 (02/05/18)  Tsat 50 Fe 120 Ca 9.8 C Ca 9.5 Phos 8.1 PTH 555       Impression: 1  NSTEMI - sp LHC with new stents x 3 to RCA, old stent in LAD 2  ESRD HD MWF   3  Volume - up 4kg by wts 4  HTN - on metop only , bp's stable 5  Anemia - Hb 11, no esa  6  MBD ckd - cont meds 7  Nutrition - cont rena vite, renal diet 8  ICM - EF 40% by [place] - HD today , max UF   Kelly Splinter MD [facility] pager [phone]   02/12/2018,  10:08 AM   Recent Labs  Lab 02/10/18 2204 02/11/18 0424 02/11/18 1648 02/12/18 0440  NA 132* 132* 132* 132*  K 3.9 4.4 4.6 5.1  CL 96* 94* 98* 97*  CO2 25 24 23 24   GLUCOSE 92 98 109* 91  BUN 24* 30* 37* 44*  CREATININE 6.05* 7.12* 7.92* 8.73*  CALCIUM 7.7* 7.9* 7.6* 7.8*  PHOS 4.2  --   --   --    Recent Labs  Lab 02/09/18 1232 02/10/18 2204  AST 19  --   ALT 11*  --   ALKPHOS 47  --   BILITOT 0.7  --   PROT 6.7  --   ALBUMIN 3.6 3.4*   Recent Labs  Lab 02/09/18 0437 02/09/18 1232 02/10/18 1630 02/11/18 0424 02/12/18 0440  WBC 8.3 8.7 10.5 9.2 8.8  NEUTROABS 5.5 6.6  --   --   --   HGB 11.6* 10.6* 10.5* 10.8* 9.3*  HCT 36.9 32.9*  32.5* 34.0* 29.9*  MCV 97.6 96.8 97.3 97.7 96.8  PLT 233 198 198 192 223   Iron/TIBC/Ferritin/ %Sat No results found for: IRON, TIBC, FERRITIN, IRONPCTSAT

## 2018-02-12 NOTE — Progress Notes (Signed)
ACT 147. Will pull sheath momentarily.

## 2018-02-12 NOTE — Progress Notes (Signed)
Back from dialysis.

## 2018-02-12 NOTE — Progress Notes (Signed)
ACT-164

## 2018-02-12 NOTE — Progress Notes (Signed)
ACT 180

## 2018-02-13 LAB — BASIC METABOLIC PANEL
Anion gap: 11 (ref 5–15)
BUN: 24 mg/dL — ABNORMAL HIGH (ref 6–20)
CO2: 26 mmol/L (ref 22–32)
Calcium: 7.8 mg/dL — ABNORMAL LOW (ref 8.9–10.3)
Chloride: 97 mmol/L — ABNORMAL LOW (ref 101–111)
Creatinine, Ser: 6.31 mg/dL — ABNORMAL HIGH (ref 0.44–1.00)
GFR calc Af Amer: 8 mL/min — ABNORMAL LOW (ref >60)
GFR calc non Af Amer: 7 mL/min — ABNORMAL LOW (ref >60)
Glucose, Bld: 113 mg/dL — ABNORMAL HIGH (ref 65–99)
Potassium: 4.3 mmol/L (ref 3.5–5.1)
Sodium: 134 mmol/L — ABNORMAL LOW (ref 135–145)

## 2018-02-13 MED ORDER — TICAGRELOR 90 MG PO TABS
90.0000 mg | ORAL_TABLET | Freq: Two times a day (BID) | ORAL | Status: DC
  Administered 2018-02-13 – 2018-02-14 (×3): 90 mg via ORAL
  Filled 2018-02-13 (×3): qty 1

## 2018-02-13 MED ORDER — TICAGRELOR 90 MG PO TABS
180.0000 mg | ORAL_TABLET | Freq: Once | ORAL | Status: AC
  Administered 2018-02-13: 180 mg via ORAL
  Filled 2018-02-13: qty 2

## 2018-02-13 MED ORDER — CARVEDILOL 3.125 MG PO TABS
3.1250 mg | ORAL_TABLET | Freq: Two times a day (BID) | ORAL | Status: DC
  Administered 2018-02-13: 3.125 mg via ORAL
  Filled 2018-02-13 (×2): qty 1

## 2018-02-13 NOTE — Progress Notes (Signed)
CARDIAC REHAB PHASE I   PRE:  Rate/Rhythm: 66 SR    BP: sitting 97/71    SaO2: 100 RA  MODE:  Ambulation: around bed   POST:  Rate/Rhythm: 75 SR with PVC    BP: sitting 104/76     SaO2: 91 RA  Pt very stiff due to knee arthritis. Slow to stand up. Knee pain severe. Able to walk around bed with RW, gait belt, assist x2. To recliner. Pt denied CP/SOB. Pt would probably benefit from PT c/s. She does have RW at home but mostly uses her husband for support. Left education materials.   [place], ACSM 02/13/2018 1:38 PM

## 2018-02-13 NOTE — Progress Notes (Signed)
Per insurance check on Brilinta  # 4 . S/W LENODREA  @ [facility] # [phone]    BRILINTA  90 MG BID  COVER- YES  CO-PAY- $ 8.50  TIER- 3 DRUG  PRIOR APPROVAL - NO   PREFERRED PHARMACY: [facility]

## 2018-02-13 NOTE — Progress Notes (Signed)
Subjective:  Doing well denies any chest pain or shortness of breath. Tolerated hemodialysis yesterday. Has not walked yet. Discussed again regarding staged PCI this admission states wants to wait for a few weeks.  Objective:  Vital Signs in the last 24 hours: Temp:  [97.6 F (36.4 C)-98.3 F (36.8 C)] 97.8 F (36.6 C) (03/21 0726) Pulse Rate:  [55-76] 73 (03/21 0900) Resp:  [9-21] 19 (03/21 0900) BP: (89-124)/(26-96) 107/96 (03/21 0900) SpO2:  [87 %-100 %] 100 % (03/21 0900)  Intake/Output from previous day: 03/20 0701 - 03/21 0700 In: 288.4 [P.O.:240; I.V.:48.4] Out: 1444  Intake/Output from this shift: Total I/O In: 241.5 [P.O.:240; I.V.:1.5] Out: -   Physical Exam: Neck: no adenopathy, no carotid bruit, no JVD and supple, symmetrical, trachea midline Lungs: clear to auscultation bilaterally Heart: regular rate and rhythm, S1, S2 normal and soft systolic murmur noted Abdomen: soft, non-tender; bowel sounds normal; no masses,  no organomegaly Extremities: extremities normal, atraumatic, no cyanosis or edema and right groin is stable  Lab Results: Recent Labs    02/11/18 0424 02/12/18 0440  WBC 9.2 8.8  HGB 10.8* 9.3*  PLT 192 223   Recent Labs    02/11/18 1648 02/12/18 0440  NA 132* 132*  K 4.6 5.1  CL 98* 97*  CO2 23 24  GLUCOSE 109* 91  BUN 37* 44*  CREATININE 7.92* 8.73*   Recent Labs    02/10/18 1630  TROPONINI 12.28*   Hepatic Function Panel Recent Labs    02/10/18 2204  ALBUMIN 3.4*   Recent Labs    02/10/18 1630  CHOL 117   No results for input(s): PROTIME in the last 72 hours.  Imaging: Imaging results have been reviewed and No results found.  Cardiac Studies:  Assessment/Plan:  Status post Acute non-Q-wave myocardial infarction Status post left cardiac catheterization/PCI to RCA complicated by spiral dissection requiring 3 stents from mid to proximal portion to tack up the dissection Multivessel CAD Ischemic  cardiomyopathy Coronary artery disease history of PCI in remote past Uncontrolled hypertension History of nephrotic syndrome End-stage renal disease on hemodialysis History of Hashimoto thyroiditis Hypothyroidism Morbid obesity GERD Possible catheter related sepsis Plan Will change the metoprolol to carvedilol in view of depressed LV systolic function And will change Plavix to Brilinta as per orders Phase I cardiac rehabilitation Possibly discharge tomorrow after hemodialysis if okay with renal service   LOS: 4 days    Charolette Forward 02/13/2018, 10:36 AM

## 2018-02-13 NOTE — Care Management Note (Signed)
Case Management Note Marvetta Gibbons RN, BSN Unit 4E-Case Manager-- [place] coverage [phone]  Patient Details  Name: Lori [name] MRN: [id number] Date of Birth: [birth year]  Subjective/Objective:   Pt admitted with STEMI, hx of HD-M/W/F                 Action/Plan: PTA pt lived at home with spouse, notified that pt was started on Brilinta- per insurance check- copay cost $8.20- spoke with pt at bedside- coverage info shared- pt provided 30 day free card to use on discharge- per pt she uses [employer]- No other CM needs noted at this time- anticipate return home. will follow.   Expected Discharge Date:                  Expected Discharge Plan:  Home/Self Care  In-House Referral:     Discharge planning Services  CM Consult, Medication Assistance  Post Acute Care Choice:    Choice offered to:     DME Arranged:    DME Agency:     HH Arranged:    HH Agency:     Status of Service:  In process, will continue to follow  If discussed at Long Length of Stay Meetings, dates discussed:    Discharge Disposition:   Additional Comments:  Dawayne Patricia, RN 02/13/2018, 3:36 PM

## 2018-02-14 LAB — CBC
HCT: 29.7 % — ABNORMAL LOW (ref 36.0–46.0)
Hemoglobin: 9.3 g/dL — ABNORMAL LOW (ref 12.0–15.0)
MCH: 30.4 pg (ref 26.0–34.0)
MCHC: 31.3 g/dL (ref 30.0–36.0)
MCV: 97.1 fL (ref 78.0–100.0)
Platelets: 231 K/uL (ref 150–400)
RBC: 3.06 MIL/uL — ABNORMAL LOW (ref 3.87–5.11)
RDW: 15.4 % (ref 11.5–15.5)
WBC: 10 K/uL (ref 4.0–10.5)

## 2018-02-14 LAB — CULTURE, BLOOD (ROUTINE X 2)
Culture: NO GROWTH
Culture: NO GROWTH
Special Requests: ADEQUATE
Special Requests: ADEQUATE

## 2018-02-14 LAB — BASIC METABOLIC PANEL WITH GFR
Anion gap: 15 (ref 5–15)
BUN: 37 mg/dL — ABNORMAL HIGH (ref 6–20)
CO2: 23 mmol/L (ref 22–32)
Calcium: 7.9 mg/dL — ABNORMAL LOW (ref 8.9–10.3)
Chloride: 95 mmol/L — ABNORMAL LOW (ref 101–111)
Creatinine, Ser: 7.78 mg/dL — ABNORMAL HIGH (ref 0.44–1.00)
GFR calc Af Amer: 6 mL/min — ABNORMAL LOW (ref >60)
GFR calc non Af Amer: 5 mL/min — ABNORMAL LOW (ref >60)
Glucose, Bld: 94 mg/dL (ref 65–99)
Potassium: 4.6 mmol/L (ref 3.5–5.1)
Sodium: 133 mmol/L — ABNORMAL LOW (ref 135–145)

## 2018-02-14 MED ORDER — ASPIRIN 81 MG PO TBEC: 81.0000 mg | DELAYED_RELEASE_TABLET | Freq: Every day | ORAL | 3 refills | Status: DC

## 2018-02-14 MED ORDER — LOSARTAN POTASSIUM 25 MG PO TABS: 12.5000 mg | ORAL_TABLET | Freq: Every day | ORAL | 3 refills | Status: DC

## 2018-02-14 MED ORDER — TRAMADOL HCL 50 MG PO TABS
ORAL_TABLET | ORAL | Status: AC
  Filled 2018-02-14: qty 1

## 2018-02-14 MED ORDER — NITROGLYCERIN 0.4 MG SL SUBL: 0.4000 mg | SUBLINGUAL_TABLET | SUBLINGUAL | 12 refills | Status: AC | PRN

## 2018-02-14 MED ORDER — TICAGRELOR 90 MG PO TABS: 90.0000 mg | ORAL_TABLET | Freq: Two times a day (BID) | ORAL | 3 refills | Status: DC

## 2018-02-14 MED ORDER — CARVEDILOL 3.125 MG PO TABS: 3.1250 mg | ORAL_TABLET | Freq: Two times a day (BID) | ORAL | 3 refills | Status: DC

## 2018-02-14 MED ORDER — ATORVASTATIN CALCIUM 80 MG PO TABS: 80.0000 mg | ORAL_TABLET | Freq: Every day | ORAL | 0 refills | Status: DC

## 2018-02-14 NOTE — Discharge Summary (Signed)
Discharge summary dictated on 02/14/2018, dictation number is [phone]

## 2018-02-14 NOTE — Discharge Instructions (Signed)
Acute Coronary Syndrome °Acute coronary syndrome (ACS) is a serious problem in which there is suddenly not enough blood and oxygen reaching the heart. ACS can result in chest pain or a heart attack. °What are the causes? °This condition may be caused by: °· A buildup of fat and cholesterol inside of the arteries (atherosclerosis). This is the most common cause. The buildup (plaque) can cause the blood vessels in your heart (coronary arteries) to become narrow or blocked. Plaque can also break off to form a clot. °· A coronary spasm. °· A tearing of the coronary artery (spontaneous coronary artery dissection). °· Low blood pressure (hypotension). °· An abnormal heart beat (arrhythmia). °· Using cocaine or methamphetamine. ° °What increases the risk? °The following factors may make you more likely to develop this condition: °· Age. °· History of chest pain, heart attack, or stroke. °· Being female. °· Family history of chest pain, heart disease, or stroke. °· Smoking. °· Inactivity. °· Being overweight. °· High cholesterol. °· High blood pressure (hypertension). °· Diabetes. °· Excessive alcohol use. ° °What are the signs or symptoms? °Common symptoms of this condition include: °· Chest pain. The pain may last long, or may stop and come back (recur). It may feel like: °? Crushing or squeezing. °? Tightness, pressure, fullness, or heaviness. °· Arm, neck, jaw, or back pain. °· Heartburn or indigestion. °· Shortness of breath. °· Nausea. °· Sudden cold sweats. °· Lightheadedness. °· Dizziness. °· Tiredness (fatigue). ° °Sometimes there are no symptoms. °How is this diagnosed? °This condition may be diagnosed through: °· An electrocardiogram (ECG). This test records the impulses of the heart. °· Blood tests. °· A CT scan of the chest. °· A coronary angiogram. This procedure checks for a blockage in the coronary arteries. ° °How is this treated? °Treatment for this condition may include: °· Oxygen. °· Medicines, such  as: °? Antiplatelet medicines and blood-thinning medicines, such as aspirin. These help prevent blood clots. °? Fibrinolytic therapy. This breaks apart a blood clot. °? Blood pressure medicines. °? Nitroglycerin. °? Pain medicine. °? Cholesterol medicine. °· A procedure called coronary angioplasty and stenting. This is done to widen a narrowed artery and keep it open. °· Coronary artery bypass surgery. This allows blood to pass the blockage to reach your heart. °· Cardiac rehabilitation. This is a program that helps improve your health and well-being. It includes exercise training, education, and counseling to help you recover. ° °Follow these instructions at home: °Eating and drinking °· Follow a heart-healthy, low-salt (sodium) diet. °· Use healthy cooking methods such as roasting, grilling, broiling, baking, poaching, steaming, or stir-frying. °· Talk to a dietitian to learn about healthy cooking methods and how to eat less sodium. °Medicines °· Take over-the-counter and prescription medicines only as told by your health care provider. °· Do not take these medicines unless your health care provider approves: °? Nonsteroidal anti-inflammatory drugs (NSAIDs), such as ibuprofen, naproxen, or celecoxib. °? Vitamin supplements that contain vitamin A or vitamin E. °? Hormone replacement therapy that contains estrogen. °Activity °· Join a cardiac rehabilitation program. °· Ask your health care provider: °? What activities and exercises are safe for you. °? If you should follow specific instructions about lifting, driving, or climbing stairs. °· If you are taking aspirin and another blood thinning medicine, avoid activities that are likely to result in an injury. The medicines can increase your risk of bleeding. °Lifestyle °· Do not use any products that contain nicotine or tobacco, such as cigarettes   and e-cigarettes. If you need help quitting, ask your health care provider. °· If you drink alcohol and your health care  provider says it is okay to drink, limit your alcohol intake to no more than 1 drink per day. One drink equals 12 oz of beer, 5 oz of wine, or 1½ oz of hard liquor. °· Maintain a healthy weight. If you need to lose weight, do it in a way that has been approved by your health care provider. °General instructions °· Tell all your health care providers about your heart condition, including your dentist. Some medicines can increase your risk of arrhythmia. °· Manage other health conditions, such as hypertension and diabetes. These conditions affect your heart. °· Learn ways to manage stress. °· Get screened for depression, and seek treatment if needed. °· Monitor your blood pressure if told by your health care provider. °· Keep your vaccinations up to date. Get the annual influenza vaccine. °· Keep all follow-up visits as told by your health care provider. This is important. °Contact a health care provider if: °· You feel overwhelmed or sad. °· You have trouble with your daily activities. °Get help right away if: °· You have pain in your chest, neck, arm, jaw, stomach, or back that recurs, and: °? Lasts more than a few minutes. °? Is not relieved by taking the medicineyour health care provider prescribed. °· You have unexplained: °? Heavy sweating. °? Heartburn or indigestion. °? Shortness of breath. °? Difficulty breathing. °? Nausea or vomiting. °? Fatigue. °? Nervousness or anxiety. °? Weakness. °? Diarrhea. °? Dark stools or blood in the stool. °· You have sudden lightheadedness or dizziness. °· Your blood pressure is higher than 180/120 °· You faint. °· You feel like hurting yourself or think about taking your own life. °These symptoms may represent a serious problem that is an emergency. Do not wait to see if the symptoms will go away. Get medical help right away. Call your local emergency services (911 in the [place] Do not drive yourself to the clinic or hospital. °Summary °· Acute coronary syndrome (ACS) is a  when there is not enough blood and oxygen being supplied to the heart. ACS can result in chest pain or a heart attack. °· Acute coronary syndrome is a medical emergency. If you have any symptoms of this condition, get help right away. °· Treatment includes oxygen, medicines, and procedures to open the blocked arteries and restore blood flow. °This information is not intended to replace advice given to you by your health care provider. Make sure you discuss any questions you have with your health care provider. °Document Released: 11/12/2005 Document Revised: 12/14/2016 Document Reviewed: 12/14/2016 °Elsevier Interactive Patient Education © 2018 [employer] ° °Coronary Angiogram With Stent °Coronary angiogram with stent placement is a procedure to widen or open a narrow blood vessel of the heart (coronary artery). Arteries may become blocked by cholesterol buildup (plaques) in the lining of the wall. When a coronary artery becomes partially blocked, blood flow to that area decreases. This may lead to chest pain or a heart attack (myocardial infarction). °A stent is a small piece of metal that looks like mesh or a spring. Stent placement may be done as treatment for a heart attack or right after a coronary angiogram in which a blocked artery is found. °Let your health care provider know about: °· Any allergies you have. °· All medicines you are taking, including vitamins, herbs, eye drops, creams, and over-the-counter medicines. °· Any   problems you or family members have had with anesthetic medicines. °· Any blood disorders you have. °· Any surgeries you have had. °· Any medical conditions you have. °· Whether you are pregnant or may be pregnant. °What are the risks? °Generally, this is a safe procedure. However, problems may occur, including: °· Damage to the heart or its blood vessels. °· A return of blockage. °· Bleeding, infection, or bruising at the insertion site. °· A collection of blood under the skin  (hematoma) at the insertion site. °· A blood clot in another part of the body. °· Kidney injury. °· Allergic reaction to the dye or contrast that is used. °· Bleeding into the abdomen (retroperitoneal bleeding). ° °What happens before the procedure? °Staying hydrated °Follow instructions from your health care provider about hydration, which may include: °· Up to 2 hours before the procedure - you may continue to drink clear liquids, such as water, clear fruit juice, black coffee, and plain tea. ° °Eating and drinking restrictions °Follow instructions from your health care provider about eating and drinking, which may include: °· 8 hours before the procedure - stop eating heavy meals or foods such as meat, fried foods, or fatty foods. °· 6 hours before the procedure - stop eating light meals or foods, such as toast or cereal. °· 2 hours before the procedure - stop drinking clear liquids. ° °Ask your health care provider about: °· Changing or stopping your regular medicines. This is especially important if you are taking diabetes medicines or blood thinners. °· Taking medicines such as ibuprofen. These medicines can thin your blood. Do not take these medicines before your procedure if your health care provider instructs you not to. Generally, aspirin is recommended before a procedure of passing a small, thin tube (catheter) through a blood vessel and into the heart (cardiac catheterization). ° °What happens during the procedure? °· An IV tube will be inserted into one of your veins. °· You will be given one or more of the following: °? A medicine to help you relax (sedative). °? A medicine to numb the area where the catheter will be inserted into an artery (local anesthetic). °· To reduce your risk of infection: °? Your health care team will wash or sanitize their hands. °? Your skin will be washed with soap. °? Hair may be removed from the area where the catheter will be inserted. °· Using a guide wire, the catheter  will be inserted into an artery. The location may be in your groin, in your wrist, or in the fold of your arm (near your elbow). °· A type of X-ray (fluoroscopy) will be used to help guide the catheter to the opening of the arteries in the heart. °· A dye will be injected into the catheter, and X-rays will be taken. The dye will help to show where any narrowing or blockages are located in the arteries. °· A tiny wire will be guided to the blocked spot, and a balloon will be inflated to make the artery wider. °· The stent will be expanded and will crush the plaques into the wall of the vessel. The stent will hold the area open and improve the blood flow. Most stents have a drug coating to reduce the risk of the stent narrowing over time. °· The artery may be made wider using a drill, laser, or other tools to remove plaques. °· When the blood flow is better, the catheter will be removed. The lining of the artery will   grow over the stent, which stays where it was placed. °This procedure may vary among health care providers and hospitals. °What happens after the procedure? °· If the procedure is done through the leg, you will be kept in bed lying flat for about 6 hours. You will be instructed to not bend and not cross your legs. °· The insertion site will be checked frequently. °· The pulse in your foot or wrist will be checked frequently. °· You may have additional blood tests, X-rays, and a test that records the electrical activity of your heart (electrocardiogram, or ECG). °This information is not intended to replace advice given to you by your health care provider. Make sure you discuss any questions you have with your health care provider. °Document Released: 05/19/2003 Document Revised: 07/12/2016 Document Reviewed: 06/17/2016 °Elsevier Interactive Patient Education © 2018 [employer] ° °

## 2018-02-14 NOTE — Plan of Care (Signed)
RN and pt discussed what life post  MI looks like for her and things she has to look forward to by maintaining a healthier lifestyle.

## 2018-02-14 NOTE — Progress Notes (Signed)
Pt transferred to dialysis w/RN...... Oswaldo Conroy, RN

## 2018-02-14 NOTE — Progress Notes (Signed)
Subjective:  Patient denies any chest pain or shortness of breath. Denies any dizziness lightheadedness or syncope.awaiting for hemodialysis  Objective:  Vital Signs in the last 24 hours: Temp:  [97.8 F (36.6 C)-99.6 F (37.6 C)] 97.8 F (36.6 C) (03/22 1100) Pulse Rate:  [57-71] 62 (03/22 0900) Resp:  [3-19] 10 (03/22 0900) BP: (77-140)/(51-103) 77/52 (03/22 0900) SpO2:  [81 %-100 %] 100 % (03/22 0900)  Intake/Output from previous day: 03/21 0701 - 03/22 0700 In: 761.5 [P.O.:760; I.V.:1.5] Out: -  Intake/Output from this shift: No intake/output data recorded.  Physical Exam: Neck: no adenopathy, no carotid bruit, no JVD and supple, symmetrical, trachea midline Lungs: clear to auscultation bilaterally Heart: regular rate and rhythm, S1, S2 normal and soft systolic murmur noted Abdomen: soft, non-tender; bowel sounds normal; no masses,  no organomegaly Extremities: extremities normal, atraumatic, no cyanosis or edema and right groin stable  Lab Results: Recent Labs    02/12/18 0440 02/14/18 0243  WBC 8.8 10.0  HGB 9.3* 9.3*  PLT 223 231   Recent Labs    02/13/18 0935 02/14/18 0243  NA 134* 133*  K 4.3 4.6  CL 97* 95*  CO2 26 23  GLUCOSE 113* 94  BUN 24* 37*  CREATININE 6.31* 7.78*   No results for input(s): TROPONINI in the last 72 hours.  Invalid input(s): CK, MB Hepatic Function Panel No results for input(s): PROT, ALBUMIN, AST, ALT, ALKPHOS, BILITOT, BILIDIR, IBILI in the last 72 hours. No results for input(s): CHOL in the last 72 hours. No results for input(s): PROTIME in the last 72 hours.  Imaging: Imaging results have been reviewed and No results found.  Cardiac Studies:  Assessment/Plan:  Status postAcute non-Q-wave myocardial infarction Status post left cardiac catheterization/PCI to RCA complicated by spiral dissection requiring 3 stents from mid to proximal portion to tack up the dissection Multivessel CAD Ischemic cardiomyopathy Coronary  artery disease history of PCI in remote past Uncontrolled hypertension History of nephrotic syndrome End-stage renal disease on hemodialysis History of Hashimoto thyroiditis Hypothyroidism Morbid obesity GERD Status post possible catheter related sepsis Plan Continue present management Will DC home later today after hemodialysis if stable Will stage PCI to LAD and left circumflex system as outpatient.  LOS: 5 days    Charolette Forward 02/14/2018, 11:35 AM

## 2018-02-14 NOTE — Progress Notes (Signed)
Pt sleepy, sts she didn't sleep well. Discussed MI, stent, Brilinta, NTG, walking as tolerated (bad knees), and CRPII. Will refer to [place]. Pt motivated and sts she can go to CRPII before HD. Will need to wait till after staged PCI.  [place], ACSM 12:28 PM 02/14/2018

## 2018-02-14 NOTE — Progress Notes (Signed)
[facility] Progress Note  Subjective: no new c/o  Vitals:   02/14/18 0800 02/14/18 0900 02/14/18 0910 02/14/18 1100  BP: (!) 102/59 (!) 77/52    Pulse: 64 62    Resp: 11 10    Temp:   97.9 F (36.6 C) 97.8 F (36.6 C)  TempSrc:   Oral Oral  SpO2: 99% 100%    Weight:      Height:        Inpatient medications: . aspirin EC  81 mg Oral Daily  . atorvastatin  80 mg Oral q1800  . carvedilol  3.125 mg Oral BID WC  . levothyroxine  300 mcg Oral QHS  . losartan  12.5 mg Oral Daily  . mouth rinse  15 mL Mouth Rinse BID  . multivitamin  1 tablet Oral QHS  . pantoprazole  40 mg Oral Daily  . sevelamer carbonate  2,400 mg Oral With snacks  . sevelamer carbonate  3,200 mg Oral TID WC  . sodium chloride flush  3 mL Intravenous Q12H  . ticagrelor  90 mg Oral BID  . Vitamin D (Ergocalciferol)  50,000 Units Oral Weekly   . sodium chloride    . sodium chloride     sodium chloride, acetaminophen, albuterol, nitroGLYCERIN, ondansetron (ZOFRAN) IV, sodium chloride flush, traMADol  Exam: Chron ill appearing WF, no distress No jVd Chest clear bilat, no rales or wheezing Cor RRR 2/6 sem, no RG Abd soft obese ntnd no hsm Ext 1+ bilat LE edema NF Ox 3 L IJ TDC site clearn  Dialysis: MWF [facility] 4h  400/800  105kg  2/2.25 bath  Hep 10K -calcitriol 0.5 ug tiw - BMD meds: Renvela 800 mg 4 tabs PO TID AC 2 tabs with snacks - last Labs: HGB 10.7 (02/05/18)  Tsat 50 Fe 120 Ca 9.8 C Ca 9.5 Phos 8.1 PTH 555       Impression: 1  NSTEMI - sp LHC with new stents x 3 to RCA, old stent patent in LAD 2  ESRD HD MWF   3  Volume - up 4kg by wts 4  HTN - on metop only , bp's stable 5  Anemia - Hb 11, no esa  6  MBD ckd - cont meds 7  Nutrition - cont rena vite, renal diet 8  ICM - EF 40% by [place] - HD today, ok for dc after HD from renal standpoint   Kelly Splinter MD [facility] pager [phone]   02/14/2018, 11:22 AM   Recent Labs  Lab  02/10/18 2204  02/12/18 0440 02/13/18 0935 02/14/18 0243  NA 132*   < > 132* 134* 133*  K 3.9   < > 5.1 4.3 4.6  CL 96*   < > 97* 97* 95*  CO2 25   < > 24 26 23   GLUCOSE 92   < > 91 113* 94  BUN 24*   < > 44* 24* 37*  CREATININE 6.05*   < > 8.73* 6.31* 7.78*  CALCIUM 7.7*   < > 7.8* 7.8* 7.9*  PHOS 4.2  --   --   --   --    < > = values in this interval not displayed.   Recent Labs  Lab 02/09/18 1232 02/10/18 2204  AST 19  --   ALT 11*  --   ALKPHOS 47  --   BILITOT 0.7  --   PROT 6.7  --   ALBUMIN 3.6 3.4*  Recent Labs  Lab 02/09/18 0437 02/09/18 1232  02/11/18 0424 02/12/18 0440 02/14/18 0243  WBC 8.3 8.7   < > 9.2 8.8 10.0  NEUTROABS 5.5 6.6  --   --   --   --   HGB 11.6* 10.6*   < > 10.8* 9.3* 9.3*  HCT 36.9 32.9*   < > 34.0* 29.9* 29.7*  MCV 97.6 96.8   < > 97.7 96.8 97.1  PLT 233 198   < > 192 223 231   < > = values in this interval not displayed.   Iron/TIBC/Ferritin/ %Sat No results found for: IRON, TIBC, FERRITIN, IRONPCTSAT

## 2018-02-14 NOTE — Discharge Summary (Signed)
NAMEMURLINE, WEIGEL                  ACCOUNT NO.:  [id number]  MEDICAL RECORD NO.:  [id number]  LOCATION:  ED                            FACILITY:  [facility]  PHYSICIAN:  Lelani Garnett N. Terrence [name], M.D. DATE OF BIRTH:  1971-07-17  DATE OF ADMISSION:  02/09/2018 DATE OF DISCHARGE:  02/14/2018                              DISCHARGE SUMMARY   ADMITTING DIAGNOSES: 1. Acute non-Q-wave myocardial infarction. 2. Coronary artery disease, history of percutaneous coronary     intervention to left anterior descending artery in remote past. 3. Uncontrolled hypertension. 4. History of nephrotic syndrome. 5. End-stage renal disease, on hemodialysis. 6. History of Hashimoto thyroiditis. 7. Hypothyroidism. 8. Morbid obesity. 9. Gastroesophageal reflux disease. 10.Lower abdominal wall pannus. 11.Possible catheter-related sepsis.  FINAL DIAGNOSES: 1. Status post acute non-Q-wave myocardial infarction, status post     left cardiac catheterization and percutaneous transluminal coronary     angioplasty stenting to proximal right coronary artery, requiring     three drug-eluting stents as the patient had spiral dissection from     ostium to the mid right coronary artery, which was successfully     treated with three drug-eluting stents.  Posterior descending     artery branch of right coronary artery remains occluded. 2. Multivessel coronary artery disease. 3. Ischemic cardiomyopathy. 4. Hypertension. 5. History of nephrotic syndrome. 6. End-stage renal disease, on hemodialysis. 7. History of Hashimoto thyroiditis. 8. Hypothyroidism. 9. Hyperlipidemia. 10.Morbid obesity. 11.Gastroesophageal reflux disease. 12.Lower abdominal wall pannus, status post possible catheter-related     sepsis.  DISCHARGE HOME MEDICATIONS: 1. Aspirin 81 mg 1 tablet daily. 2. Atorvastatin 80 mg daily. 3. Carvedilol 3.125 mg twice daily. 4. Losartan 12.5 mg daily. 5. Nitrostat sublingual p.r.n. 6. Brilinta 90 mg 1 tablet twice  daily. Continue these home medications, i.e. 1. Albuterol inhaler 2 puffs every 6 hours as needed. 2. B complex, vitamin C with folic acid 1 tablet daily as before. 3. EpiPen use as needed. 4. Levothyroxine 300 mcg 1 tab by mouth daily as before. 5. Omeprazole 40 mg 1 capsule daily. 6. Renvela 2400 to 3200 mg 3 times daily. 7. Vitamin D 50,000 capsule once a week.  DIET:  Low-salt, low-cholesterol, weight reducing/renal diet.  CONDITION AT DISCHARGE:  Stable.  Post cardiac catheterization/PTCA stent instructions have been given.  FOLLOWUP:  Follow up with me in 1 week.  The patient will be scheduled for staged PCI as outpatient as the patient refused further PCI during this admission.  The patient will be referred to phase 2 cardiac rehab once complete revascularization is done.  The patient is not the candidate for CABG in view of diffuse distal disease and very poor targets.  BRIEF HISTORY AND HOSPITAL COURSE:  Lori [name] is 47 year old female with past medical history significant for coronary artery disease, status post PCI to LAD many years ago; hypertension; history of nephrotic syndrome; end-stage renal disease, on hemodialysis for the last 20+ years; morbid obesity; GERD; hypothyroidism; history of Hashimoto thyroiditis.  She was transferred from [facility] ER for further management.  The patient complained of retrosternal and left- sided chest pain radiating to the left axillary region, described as dull aching  pressure, off and on since Thursday.  Did not seek any medical attention initially, but as the pain got worse this morning, so decided to go to the ER.  The patient received sublingual nitro and was started on heparin with partial relief of chest pain and was noted to have elevated troponin-I.  EKG done in the ED showed normal sinus rhythm with left bundle-branch block.  Troponin-I initially was trending down, but subsequently had further elevation of  troponin-I.  PHYSICAL EXAMINATION:  GENERAL:  She was alert, awake, oriented x3. VITAL SIGNS:  Blood pressure 160/96, pulse 56, she was afebrile. HEENT:  Conjunctivae pink. NECK:  Supple.  No JVD.  No bruit. LUNGS:  Decreased breath sounds at bases. CARDIOVASCULAR:  S1, S2 was normal.  There was soft systolic murmur.  No pericardial rub. ABDOMEN:  Soft, obese.  There was lower abdominal wall erythema with small swelling boil noted with pannus. EXTREMITIES:  There was no clubbing, cyanosis.  1+ edema noted. NEUROLOGIC:  Grossly intact.  LABORATORY DATA:  First set of labs; sodium 136, potassium 5.3, BUN 46, creatinine 8.77.  Troponin-I was 2.59.  Repeat troponin-I point of care was 1.22.  Repeat three sets of troponin-I prior to the PCI were 3.03, 4.82 and 12.28, which was on February 10, 2018.  Her last electrolytes; sodium 132, potassium 4.4, BUN 30, creatinine 7.12.  Hemoglobin was 10.8, hematocrit 34, white count of 9.2.  BRIEF HOSPITAL COURSE:  The patient was admitted to CCU.  The patient ruled in for non-Q-wave myocardial infarction.  The patient subsequently underwent left cardiac catheterization.  As per procedure report, the patient was noted to have 3-vessel disease with occluded PDA branch of the RCA, which was the culprit lesion for recent MI.  RCA also had proximal and mid-critical stenosis.  The vessel was very torturous.  The patient also had in-stent moderate proximal LAD in-stent restenosis and critical mid LAD stenosis and multiple sequential lesions in obtuse marginal 2 and distal left circumflex.  The patient subsequently underwent PCI to RCA, which was complicated by spiral dissection from proximal to midportion of the RCA, which was successfully treated with three drug-eluting stents using GuideLiner catheter as vessel was very tortuous.  The patient's PDA was angioplastied, multiple inflations were done without any improvement in PDA flow.  The patient tolerated  the procedure well and was transferred to CCU in stable condition. Postprocedure, the patient did not have any episodes of chest pain during the hospital stay.  Discussed with the patient regarding staged PCI and relook angiogram.  The patient stated wanted to wait few weeks and will come back as outpatient.  The patient has received multiple dialysis treatment, which she has tolerated it well.  The patient will be discharged home today after hemodialysis and will be followed up in my office in 1 week and will be scheduled for staged PCI to LAD and left circumflex system as outpatient in few weeks.     Lori [name], M.D.     MNH/MEDQ  D:  02/14/2018  T:  02/14/2018  Job:  [id number]  cc:   Lori [name], M.D.

## 2018-02-27 ENCOUNTER — Encounter (INDEPENDENT_AMBULATORY_CARE_PROVIDER_SITE_OTHER): Payer: Medicare Other | Admitting: Vascular Surgery

## 2018-03-03 DIAGNOSIS — E785 Hyperlipidemia, unspecified: Secondary | ICD-10-CM | POA: Insufficient documentation

## 2018-03-13 ENCOUNTER — Encounter (INDEPENDENT_AMBULATORY_CARE_PROVIDER_SITE_OTHER): Payer: Medicare Other | Admitting: Vascular Surgery

## 2018-03-17 ENCOUNTER — Ambulatory Visit (INDEPENDENT_AMBULATORY_CARE_PROVIDER_SITE_OTHER): Payer: Medicare Other | Admitting: Vascular Surgery

## 2018-03-17 ENCOUNTER — Encounter (INDEPENDENT_AMBULATORY_CARE_PROVIDER_SITE_OTHER): Payer: Self-pay | Admitting: Vascular Surgery

## 2018-03-17 VITALS — BP 128/91 | HR 84 | Resp 17 | Ht 63.0 in | Wt 234.8 lb

## 2018-03-17 DIAGNOSIS — K219 Gastro-esophageal reflux disease without esophagitis: Secondary | ICD-10-CM

## 2018-03-17 DIAGNOSIS — T829XXS Unspecified complication of cardiac and vascular prosthetic device, implant and graft, sequela: Secondary | ICD-10-CM

## 2018-03-17 DIAGNOSIS — E039 Hypothyroidism, unspecified: Secondary | ICD-10-CM | POA: Diagnosis not present

## 2018-03-17 DIAGNOSIS — I214 Non-ST elevation (NSTEMI) myocardial infarction: Secondary | ICD-10-CM

## 2018-03-17 DIAGNOSIS — Z992 Dependence on renal dialysis: Secondary | ICD-10-CM | POA: Diagnosis not present

## 2018-03-17 DIAGNOSIS — T829XXA Unspecified complication of cardiac and vascular prosthetic device, implant and graft, initial encounter: Secondary | ICD-10-CM | POA: Insufficient documentation

## 2018-03-17 DIAGNOSIS — N186 End stage renal disease: Secondary | ICD-10-CM

## 2018-03-17 NOTE — Progress Notes (Signed)
MRN : [id number]  Lori [name] is a 47 y.o. (Nov 29, 1970) female who presents with chief complaint of  Chief Complaint  Patient presents with  . New Patient (Initial Visit)    ref [facility] for ESRD  .  History of Present Illness:    The patient is seen for evaluation of dialysis access.  The patient has a history of multiple failed accesses.  There have been accesses in both arms and in the thighs.    Current access is via a catheter which is functioning well.  There have not been any episodes of catheter infection.  The patient notes that a few weeks ago she sustained an acute MI.  She required multiple stents because they "ruptured her artery" she ended up in the hospital for 6 days at which she describes as bedrest and then states that she could barely walk and needed to use a walker following that event.  Today she is walking with a cane.  She also has had a follow-up with Dr. Terrence Dupont last week who said there is another blockage in the front of her heart that needs to be fixed.  The patient denies amaurosis fugax or recent TIA symptoms. There are no recent neurological changes noted. The patient denies claudication symptoms or rest pain symptoms. The patient denies history of DVT, PE or superficial thrombophlebitis. The patient denies recent episodes of angina or shortness of breath.    Current Meds  Medication Sig  . albuterol (PROVENTIL HFA;VENTOLIN HFA) 108 (90 BASE) MCG/ACT inhaler Inhale 2 puffs into the lungs every 6 (six) hours as needed. Shortness of breath  . aspirin EC 81 MG EC tablet Take 1 tablet (81 mg total) by mouth daily.  Marland Kitchen atorvastatin (LIPITOR) 80 MG tablet Take 1 tablet (80 mg total) by mouth daily at 6 PM.  . b complex-vitamin c-folic acid (NEPHRO-VITE) 0.8 MG TABS tablet Take 1 tablet by mouth daily.  . carvedilol (COREG) 3.125 MG tablet Take 1 tablet (3.125 mg total) by mouth 2 (two) times daily with a meal.  . EPINEPHrine 0.3 mg/0.3 mL IJ SOAJ injection  Inject 0.3 mg into the muscle as needed.  Marland Kitchen levothyroxine (SYNTHROID, LEVOTHROID) 300 MCG tablet Take 300 mcg by mouth daily before breakfast.   . losartan (COZAAR) 25 MG tablet Take 0.5 tablets (12.5 mg total) by mouth daily.  . nitroGLYCERIN (NITROSTAT) 0.4 MG SL tablet Place 1 tablet (0.4 mg total) under the tongue every 5 (five) minutes x 3 doses as needed for chest pain.  Marland Kitchen omeprazole (PRILOSEC) 40 MG capsule Take 1 capsule by mouth daily.  . sevelamer carbonate (RENVELA) 800 MG tablet Take 2,400-3,200 mg by mouth 3 (three) times daily with meals. 3200mg  with meals and 2400mg  with snacks  . ticagrelor (BRILINTA) 90 MG TABS tablet Take 1 tablet (90 mg total) by mouth 2 (two) times daily.  . Vitamin D, Ergocalciferol, (DRISDOL) 50000 units CAPS capsule Take 1 capsule by mouth once a week.    Past Medical History:  Diagnosis Date  . Anemia   . ASCVD (arteriosclerotic cardiovascular disease)   . Calciphylaxis   . Chronic abdominal wound infection   . Dialysis patient ([place])   . ESRD (end stage renal disease) ([place])    Due to membranous GN dialysis 09/1996; peritoneal dialysis --? peitonitis; difficult vascular access  . GERD (gastroesophageal reflux disease)   . Hashimoto thyroiditis   . Hyperparathyroidism   . Hypertension   . Hypothyroidism   . Medically noncompliant   .  Morbid obesity ([place])     Past Surgical History:  Procedure Laterality Date  . [place]  . LEFT HEART CATH AND CORONARY ANGIOGRAPHY N/A 02/11/2018   Procedure: LEFT HEART CATH AND CORONARY ANGIOGRAPHY;  Surgeon: Charolette Forward, MD;  Location: [facility] [facility];  Service: Cardiovascular;  Laterality: N/A;  . THYROIDECTOMY, PARTIAL     Resectin of left lobe with reimplantation in the forearm, small focus of papillary carcinoma incidentally noted at pathology - 2000 and Hashimoto's thyrdoiditis in 2001  . TONSILLECTOMY AND ADENOIDECTOMY      Social History Social History   Tobacco Use  . Smoking status: Former  Research scientist (life sciences)  . Smokeless tobacco: Never Used  Substance Use Topics  . Alcohol use: No  . Drug use: No    Family History Family History  Problem Relation Age of Onset  . Coronary artery disease Mother   . Kidney disease Father   . Diabetes Sister   No family history of bleeding/clotting disorders, porphyria or autoimmune disease   Allergies  Allergen Reactions  . Activase [Alteplase] Shortness Of Breath  . Bee Pollen Anaphylaxis  . Warfarin Sodium Nausea And Vomiting and Rash     REVIEW OF SYSTEMS (Negative unless checked)  Constitutional: [] Weight loss  [] Fever  [] Chills Cardiac: [x] Chest pain   [] Chest pressure   [] Palpitations   [] Shortness of breath when laying flat   [] Shortness of breath with exertion. Vascular:  [x] Pain in legs with walking   [x] Pain in legs at rest  [] History of DVT   [] Phlebitis   [x] Swelling in legs   [] Varicose veins   [] Non-healing ulcers Pulmonary:   [] Uses home oxygen   [] Productive cough   [] Hemoptysis   [] Wheeze  [] COPD   [] Asthma Neurologic:  [] Dizziness   [] Seizures   [] History of stroke   [] History of TIA  [] Aphasia   [] Vissual changes   [] Weakness or numbness in arm   [] Weakness or numbness in leg Musculoskeletal:   [x] Joint swelling   [x] Joint pain   [x] Low back pain Hematologic:  [] Easy bruising  [] Easy bleeding   [] Hypercoagulable state   [] Anemic Gastrointestinal:  [] Diarrhea   [] Vomiting  [] Gastroesophageal reflux/heartburn   [] Difficulty swallowing. Genitourinary:  [x] Chronic kidney disease   [] Difficult urination  [] Frequent urination   [] Blood in urine Skin:  [x] Rashes   [] Ulcers  Psychological:  [] History of anxiety   []  History of major depression.  Physical Examination  Vitals:   03/17/18 1421  BP: (!) 128/91  Pulse: 84  Resp: 17  Weight: 234 lb 12.8 oz (106.5 kg)  Height: 5\' 3"  (1.6 m)   Body mass index is 41.59 kg/m. Gen: WD/WN, NAD Head: [place]/AT, No temporalis wasting.  Ear/Nose/Throat: Hearing grossly intact, nares w/o  erythema or drainage, poor dentition Eyes: PER, EOMI, sclera nonicteric.  Neck: Supple, no masses.  No bruit or JVD.  Pulmonary:  Good air movement, clear to auscultation bilaterally, no use of accessory muscles.  Cardiac: RRR, normal S1, S2, no Murmurs. Vascular: Multiple nonfunctioning axis is noted both arms upper and lower as well as in both groins.  Right IJ catheter with significant buildup noted on the catheter itself but no erythema or drainage at the exit site. Vessel Right Left  Radial Palpable Palpable  Brachial Palpable Palpable  Carotid Palpable Palpable  Gastrointestinal: soft, non-distended. No guarding/no peritoneal signs.  Musculoskeletal: M/S 5/5 throughout.  No deformity or atrophy.  Neurologic: CN 2-12 intact. Pain and light touch intact in extremities.  Symmetrical.  Speech  is fluent. Motor exam as listed above. Psychiatric: Judgment intact, Mood & affect appropriate for pt's clinical situation. Dermatologic: diffuse rashes no ulcers noted.  No changes consistent with cellulitis. Lymph : No Cervical lymphadenopathy, no lichenification or skin changes of chronic lymphedema.  CBC Lab Results  Component Value Date   WBC 10.0 02/14/2018   HGB 9.3 (L) 02/14/2018   HCT 29.7 (L) 02/14/2018   MCV 97.1 02/14/2018   PLT 231 02/14/2018    BMET    Component Value Date/Time   NA 133 (L) 02/14/2018 0243   NA 138 03/26/2014 1425   K 4.6 02/14/2018 0243   K 3.5 03/26/2014 1425   CL 95 (L) 02/14/2018 0243   CL 99 03/26/2014 1425   CO2 23 02/14/2018 0243   CO2 26 03/26/2014 1425   GLUCOSE 94 02/14/2018 0243   GLUCOSE 79 03/26/2014 1425   BUN 37 (H) 02/14/2018 0243   BUN 36 (H) 03/26/2014 1425   CREATININE 7.78 (H) 02/14/2018 0243   CREATININE 9.54 (H) 03/26/2014 1425   CALCIUM 7.9 (L) 02/14/2018 0243   CALCIUM 7.7 (L) 03/26/2014 1425   GFRNONAA 5 (L) 02/14/2018 0243   GFRNONAA 4 (L) 03/26/2014 1425   GFRAA 6 (L) 02/14/2018 0243   GFRAA 5 (L) 03/26/2014 1425    CrCl cannot be calculated (Patient's most recent lab result is older than the maximum 21 days allowed.).  COAG Lab Results  Component Value Date   INR 1.13 02/09/2018   INR 1.25 03/27/2014   INR 1.1 03/26/2014    Radiology No results found.   Assessment/Plan 1. Complication from renal dialysis device, sequela Recommend:  The patient is doing well and currently has adequate dialysis access.  Although the patient's dialysis center is not reporting any access issues this catheter has functioned for several years without problem.  Yes, it does have significant buildup on the catheter itself but there is no evidence of catheter infection at the exit site.  This patient has had an acute MI just weeks ago and this is no time to investigate further surgical options given the recent cardiac event.  Options are very limited.  She has profound superior vena cava syndrome and very limited access from the neck and upper extremities.  As I recall she had a right arm hero graft that resulted in such profound steal that it required ligation.  Attempts at creating a redo groin access resulted in a graft infection and subsequent hemorrhage that almost resulted in her death.  In almost all cases inducing general anesthesia which is an absolute requirement as she is intolerant of pain of even the most minor degree results in significant hypotension which given her recent cardiac event could likely result in her death.  Not to mention, that there appears to be yet another significant lesion which her cardiologist says needs to be treated and this most certainly should be treated prior to any revision of her access.  Given these findings it would seem somewhat foolhardy and inappropriate to proposed that we try to create some sort of extremity access when her catheter is functioning perfectly well.   2. ESRD on dialysis [facility]) Continue dialysis without interruption  3. Non-STEMI (non-ST elevated  myocardial infarction) ([place]) Continue cardiac and antihypertensive medications as already ordered and reviewed, no changes at this time.  Continue statin as ordered and reviewed, no changes at this time  Nitrates PRN for chest pain   4. Gastroesophageal reflux disease, esophagitis presence not specified Continue antihypertensive  medications as already ordered, these medications have been reviewed and there are no changes at this time.  Avoidence of caffeine and alcohol  Moderate elevation of the head of the bed   5. Hypothyroidism, unspecified type Continue Synthroid as ordered and reviewed, no changes at this time     Hortencia Pilar, MD  03/17/2018 9:04 PM

## 2018-04-17 ENCOUNTER — Ambulatory Visit (INDEPENDENT_AMBULATORY_CARE_PROVIDER_SITE_OTHER): Payer: Medicare Other | Admitting: Vascular Surgery

## 2018-04-17 DIAGNOSIS — K219 Gastro-esophageal reflux disease without esophagitis: Secondary | ICD-10-CM

## 2018-04-17 DIAGNOSIS — N186 End stage renal disease: Secondary | ICD-10-CM

## 2018-04-17 DIAGNOSIS — I872 Venous insufficiency (chronic) (peripheral): Secondary | ICD-10-CM

## 2018-04-17 DIAGNOSIS — Z992 Dependence on renal dialysis: Secondary | ICD-10-CM

## 2018-04-17 DIAGNOSIS — I214 Non-ST elevation (NSTEMI) myocardial infarction: Secondary | ICD-10-CM

## 2018-04-21 ENCOUNTER — Encounter (INDEPENDENT_AMBULATORY_CARE_PROVIDER_SITE_OTHER): Payer: Self-pay | Admitting: Vascular Surgery

## 2018-04-21 DIAGNOSIS — I872 Venous insufficiency (chronic) (peripheral): Secondary | ICD-10-CM | POA: Insufficient documentation

## 2018-04-21 DIAGNOSIS — I890 Lymphedema, not elsewhere classified: Secondary | ICD-10-CM | POA: Insufficient documentation

## 2018-04-21 NOTE — Progress Notes (Addendum)
MRN : [id number]  Lori [name] is a 47 y.o. (26-Dec-1970) female who presents with chief complaint of No chief complaint on file. .     No outpatient medications have been marked as taking for the 04/17/18 encounter (Office Visit) with Delana Meyer, Dolores Lory, MD.    Past Medical History:  Diagnosis Date  . Anemia   . ASCVD (arteriosclerotic cardiovascular disease)   . Calciphylaxis   . Chronic abdominal wound infection   . Dialysis patient ([place])   . ESRD (end stage renal disease) ([place])    Due to membranous GN dialysis 09/1996; peritoneal dialysis --? peitonitis; difficult vascular access  . GERD (gastroesophageal reflux disease)   . Hashimoto thyroiditis   . Hyperparathyroidism   . Hypertension   . Hypothyroidism   . Medically noncompliant   . Morbid obesity ([place])     Past Surgical History:  Procedure Laterality Date  . [place]  . LEFT HEART CATH AND CORONARY ANGIOGRAPHY N/A 02/11/2018   Procedure: LEFT HEART CATH AND CORONARY ANGIOGRAPHY;  Surgeon: Charolette Forward, MD;  Location: [facility] [facility];  Service: Cardiovascular;  Laterality: N/A;  . THYROIDECTOMY, PARTIAL     Resectin of left lobe with reimplantation in the forearm, small focus of papillary carcinoma incidentally noted at pathology - 2000 and Hashimoto's thyrdoiditis in 2001  . TONSILLECTOMY AND ADENOIDECTOMY      Social History Social History   Tobacco Use  . Smoking status: Former Research scientist (life sciences)  . Smokeless tobacco: Never Used  Substance Use Topics  . Alcohol use: No  . Drug use: No    Family History Family History  Problem Relation Age of Onset  . Coronary artery disease Mother   . Kidney disease Father   . Diabetes Sister   No family history of bleeding/clotting disorders, porphyria or autoimmune disease   Allergies  Allergen Reactions  . Activase [Alteplase] Shortness Of Breath  . Bee Pollen Anaphylaxis  . Warfarin Sodium Nausea And Vomiting and Rash     REVIEW OF SYSTEMS (Negative unless  checked)  Constitutional: [] Weight loss  [] Fever  [] Chills Cardiac: [] Chest pain   [] Chest pressure   [] Palpitations   [] Shortness of breath when laying flat   [] Shortness of breath with exertion. Vascular:  [] Pain in legs with walking   [x] Pain in legs at rest  [] History of DVT   [] Phlebitis   [x] Swelling in legs   [] Varicose veins   [] Non-healing ulcers Pulmonary:   [] Uses home oxygen   [] Productive cough   [] Hemoptysis   [] Wheeze  [] COPD   [] Asthma Neurologic:  [] Dizziness   [] Seizures   [] History of stroke   [] History of TIA  [] Aphasia   [] Vissual changes   [] Weakness or numbness in arm   [] Weakness or numbness in leg Musculoskeletal:   [] Joint swelling   [] Joint pain   [] Low back pain Hematologic:  [] Easy bruising  [] Easy bleeding   [] Hypercoagulable state   [] Anemic Gastrointestinal:  [] Diarrhea   [] Vomiting  [] Gastroesophageal reflux/heartburn   [] Difficulty swallowing. Genitourinary:  [] Chronic kidney disease   [] Difficult urination  [] Frequent urination   [] Blood in urine Skin:  [] Rashes   [] Ulcers  Psychological:  [] History of anxiety   []  History of major depression.  Physical Examination  There were no vitals filed for this visit. There is no height or weight on file to calculate BMI.  CBC Lab Results  Component Value Date   WBC 10.0 02/14/2018   HGB 9.3 (L) 02/14/2018   HCT  29.7 (L) 02/14/2018   MCV 97.1 02/14/2018   PLT 231 02/14/2018    BMET    Component Value Date/Time   NA 133 (L) 02/14/2018 0243   NA 138 03/26/2014 1425   K 4.6 02/14/2018 0243   K 3.5 03/26/2014 1425   CL 95 (L) 02/14/2018 0243   CL 99 03/26/2014 1425   CO2 23 02/14/2018 0243   CO2 26 03/26/2014 1425   GLUCOSE 94 02/14/2018 0243   GLUCOSE 79 03/26/2014 1425   BUN 37 (H) 02/14/2018 0243   BUN 36 (H) 03/26/2014 1425   CREATININE 7.78 (H) 02/14/2018 0243   CREATININE 9.54 (H) 03/26/2014 1425   CALCIUM 7.9 (L) 02/14/2018 0243   CALCIUM 7.7 (L) 03/26/2014 1425   GFRNONAA 5 (L) 02/14/2018  0243   GFRNONAA 4 (L) 03/26/2014 1425   GFRAA 6 (L) 02/14/2018 0243   GFRAA 5 (L) 03/26/2014 1425   CrCl cannot be calculated (Patient's most recent lab result is older than the maximum 21 days allowed.).  COAG Lab Results  Component Value Date   INR 1.13 02/09/2018   INR 1.25 03/27/2014   INR 1.1 03/26/2014    Radiology No results found.   Assessment/Plan 1. ESRD on dialysis [facility]) The patient did not show up for her appointment   Hortencia Pilar, MD  04/21/2018 3:08 PM

## 2018-04-21 NOTE — Addendum Note (Signed)
Addended by: Katha Cabal on: 04/21/2018 03:18 PM   Modules accepted: Level of Service

## 2018-04-29 ENCOUNTER — Encounter (HOSPITAL_COMMUNITY): Admission: RE | Payer: Self-pay | Source: Ambulatory Visit

## 2018-04-29 ENCOUNTER — Ambulatory Visit (HOSPITAL_COMMUNITY): Admission: RE | Admit: 2018-04-29 | Payer: Medicare Other | Source: Ambulatory Visit | Admitting: Cardiology

## 2018-04-29 SURGERY — CORONARY STENT INTERVENTION
Anesthesia: LOCAL

## 2018-10-26 ENCOUNTER — Emergency Department (HOSPITAL_COMMUNITY): Payer: Medicare Other

## 2018-10-26 ENCOUNTER — Encounter (HOSPITAL_COMMUNITY): Payer: Self-pay | Admitting: *Deleted

## 2018-10-26 ENCOUNTER — Other Ambulatory Visit: Payer: Self-pay

## 2018-10-26 ENCOUNTER — Emergency Department (HOSPITAL_COMMUNITY)
Admission: EM | Admit: 2018-10-26 | Discharge: 2018-10-26 | Disposition: A | Discharge status: Home / Self Care | Payer: Medicare Other | Attending: Emergency Medicine | Admitting: Emergency Medicine

## 2018-10-26 DIAGNOSIS — E039 Hypothyroidism, unspecified: Secondary | ICD-10-CM | POA: Diagnosis not present

## 2018-10-26 DIAGNOSIS — Z79899 Other long term (current) drug therapy: Secondary | ICD-10-CM | POA: Diagnosis not present

## 2018-10-26 DIAGNOSIS — R062 Wheezing: Secondary | ICD-10-CM | POA: Diagnosis not present

## 2018-10-26 DIAGNOSIS — I120 Hypertensive chronic kidney disease with stage 5 chronic kidney disease or end stage renal disease: Secondary | ICD-10-CM | POA: Diagnosis not present

## 2018-10-26 DIAGNOSIS — N186 End stage renal disease: Secondary | ICD-10-CM | POA: Insufficient documentation

## 2018-10-26 DIAGNOSIS — Z992 Dependence on renal dialysis: Secondary | ICD-10-CM | POA: Insufficient documentation

## 2018-10-26 DIAGNOSIS — Z87891 Personal history of nicotine dependence: Secondary | ICD-10-CM | POA: Insufficient documentation

## 2018-10-26 DIAGNOSIS — M79641 Pain in right hand: Secondary | ICD-10-CM | POA: Diagnosis not present

## 2018-10-26 DIAGNOSIS — Z7982 Long term (current) use of aspirin: Secondary | ICD-10-CM | POA: Diagnosis not present

## 2018-10-26 LAB — CBC WITH DIFFERENTIAL/PLATELET
Abs Immature Granulocytes: 0.04 10*3/uL (ref 0.00–0.07)
Basophils Absolute: 0.1 10*3/uL (ref 0.0–0.1)
Basophils Relative: 1 %
Eosinophils Absolute: 0.4 10*3/uL (ref 0.0–0.5)
Eosinophils Relative: 5 %
HCT: 38.9 % (ref 36.0–46.0)
Hemoglobin: 12.1 g/dL (ref 12.0–15.0)
Immature Granulocytes: 1 %
Lymphocytes Relative: 21 %
Lymphs Abs: 1.6 10*3/uL (ref 0.7–4.0)
MCH: 30.2 pg (ref 26.0–34.0)
MCHC: 31.1 g/dL (ref 30.0–36.0)
MCV: 97 fL (ref 80.0–100.0)
Monocytes Absolute: 0.8 10*3/uL (ref 0.1–1.0)
Monocytes Relative: 10 %
Neutro Abs: 5 10*3/uL (ref 1.7–7.7)
Neutrophils Relative %: 62 %
Platelets: 196 10*3/uL (ref 150–400)
RBC: 4.01 MIL/uL (ref 3.87–5.11)
RDW: 14.6 % (ref 11.5–15.5)
WBC: 7.9 10*3/uL (ref 4.0–10.5)
nRBC: 0 % (ref 0.0–0.2)

## 2018-10-26 LAB — COMPREHENSIVE METABOLIC PANEL
ALT: 11 U/L (ref 0–44)
AST: 13 U/L — ABNORMAL LOW (ref 15–41)
Albumin: 3.9 g/dL (ref 3.5–5.0)
Alkaline Phosphatase: 59 U/L (ref 38–126)
Anion gap: 14 (ref 5–15)
BUN: 50 mg/dL — ABNORMAL HIGH (ref 6–20)
CO2: 25 mmol/L (ref 22–32)
Calcium: 7.9 mg/dL — ABNORMAL LOW (ref 8.9–10.3)
Chloride: 98 mmol/L (ref 98–111)
Creatinine, Ser: 9.32 mg/dL — ABNORMAL HIGH (ref 0.44–1.00)
GFR calc Af Amer: 5 mL/min — ABNORMAL LOW (ref >60)
GFR calc non Af Amer: 4 mL/min — ABNORMAL LOW (ref >60)
Glucose, Bld: 83 mg/dL (ref 70–99)
Potassium: 5.3 mmol/L — ABNORMAL HIGH (ref 3.5–5.1)
Sodium: 137 mmol/L (ref 135–145)
Total Bilirubin: 0.6 mg/dL (ref 0.3–1.2)
Total Protein: 7.6 g/dL (ref 6.5–8.1)

## 2018-10-26 LAB — TROPONIN I: Troponin I: 0.03 ng/mL (ref <0.03)

## 2018-10-26 MED ORDER — ALBUTEROL SULFATE (2.5 MG/3ML) 0.083% IN NEBU
5.0000 mg | INHALATION_SOLUTION | Freq: Once | RESPIRATORY_TRACT | Status: AC
  Administered 2018-10-26: 5 mg via RESPIRATORY_TRACT
  Filled 2018-10-26: qty 6

## 2018-10-26 MED ORDER — CLINDAMYCIN HCL 150 MG PO CAPS
300.0000 mg | ORAL_CAPSULE | Freq: Once | ORAL | Status: AC
  Administered 2018-10-26: 300 mg via ORAL
  Filled 2018-10-26: qty 2

## 2018-10-26 MED ORDER — CLINDAMYCIN HCL 300 MG PO CAPS: 300.0000 mg | ORAL_CAPSULE | Freq: Four times a day (QID) | ORAL | 0 refills | Status: DC

## 2018-10-26 MED ORDER — TRAMADOL HCL 50 MG PO TABS: 50.0000 mg | ORAL_TABLET | Freq: Four times a day (QID) | ORAL | 0 refills | Status: DC | PRN

## 2018-10-26 MED ORDER — TRAMADOL HCL 50 MG PO TABS
50.0000 mg | ORAL_TABLET | Freq: Once | ORAL | Status: AC
  Administered 2018-10-26: 50 mg via ORAL
  Filled 2018-10-26: qty 1

## 2018-10-26 NOTE — Discharge Instructions (Signed)
Return immediately for worsening pain, redness, swelling, fever or any concerns.  Take medications as prescribed.

## 2018-10-26 NOTE — ED Triage Notes (Signed)
C/o right hand pain, fell a week ago and had a piece a wood go in hand a week ago, self care.  Pt states that is not healing,  hit right index finger on side of woodstove about a week ago.

## 2018-10-26 NOTE — ED Provider Notes (Signed)
[facility] EMERGENCY DEPARTMENT Provider Note   CSN: [id number] Arrival date & time: 10/26/18  1106     History   Chief Complaint Chief Complaint  Patient presents with  . Hand Pain    HPI Lori [name] is a 47 y.o. female.  HPI Patient presents with pain to her right hand.  States she injured her index finger 4 days ago striking it on the wood stove.  She has had increased swelling and pain to the finger with decreased range of motion.  She also complains of a wound to her right palm that happened several weeks ago.  She states the wound is not healing well.  She denies any pain or foreign body sensation.  No fever or chills.  She has had increased shortness of breath and wheezing. Past Medical History:  Diagnosis Date  . Anemia   . ASCVD (arteriosclerotic cardiovascular disease)   . Calciphylaxis   . Chronic abdominal wound infection   . Dialysis patient ([place])   . ESRD (end stage renal disease) ([place])    Due to membranous GN dialysis 09/1996; peritoneal dialysis --? peitonitis; difficult vascular access  . GERD (gastroesophageal reflux disease)   . Hashimoto thyroiditis   . Hyperparathyroidism   . Hypertension   . Hypothyroidism   . Medically noncompliant   . Morbid obesity [facility])     Patient Active Problem List   Diagnosis Date Noted  . Lymphedema 04/21/2018  . Chronic venous insufficiency 04/21/2018  . Complication from renal dialysis device 03/17/2018  . Non-STEMI (non-ST elevated myocardial infarction) ([place]) 02/09/2018  . Acute non Q wave myocardial infarction ([place]) 02/09/2018  . Bleeding from wound 03/27/2014  . Hemorrhagic shock ([place]) 03/27/2014  . Hypocalcemia 03/27/2014  . ESRD on dialysis ([place]) 03/27/2014  . Acute blood loss anemia 03/27/2014  . Hypothyroidism 03/27/2014  . Chronic abdominal wound infection 03/27/2014  . Calciphylaxis 03/27/2014  . Cellulitis 08/11/2012  . ATHEROSCLEROTIC CARDIOVASCULAR DISEASE 09/22/2010  . SYNCOPE 07/16/2010  .  POSTURAL LIGHTHEADEDNESS 07/16/2010  . OBESITY 07/14/2010  . Anemia of other chronic disease 07/14/2010  . GASTROESOPHAGEAL REFLUX DISEASE 07/14/2010  . RENAL FAILURE, END STAGE 07/14/2010  . PALPITATIONS 07/14/2010  . HYPERPARATHYROIDISM, HX OF 07/14/2010    Past Surgical History:  Procedure Laterality Date  . [place]  . LEFT HEART CATH AND CORONARY ANGIOGRAPHY N/A 02/11/2018   Procedure: LEFT HEART CATH AND CORONARY ANGIOGRAPHY;  Surgeon: Charolette Forward, MD;  Location: [place] [facility];  Service: Cardiovascular;  Laterality: N/A;  . THYROIDECTOMY, PARTIAL     Resectin of left lobe with reimplantation in the forearm, small focus of papillary carcinoma incidentally noted at pathology - 2000 and Hashimoto's thyrdoiditis in 2001  . TONSILLECTOMY AND ADENOIDECTOMY       OB History    Gravida  1   Para  1   Term      Preterm  1   AB      Living        SAB      TAB      Ectopic      Multiple      Live Births               Home Medications    Prior to Admission medications   Medication Sig Start Date End Date Taking? Authorizing Provider  albuterol (PROVENTIL HFA;VENTOLIN HFA) 108 (90 BASE) MCG/ACT inhaler Inhale 2 puffs into the lungs every 6 (six) hours as needed. Shortness of  breath   Yes [provider]  amLODipine (NORVASC) 10 MG tablet Take 1 tablet by mouth daily. 08/06/18  Yes [provider]  aspirin EC 81 MG EC tablet Take 1 tablet (81 mg total) by mouth daily. 02/15/18  Yes Charolette Forward, MD  atorvastatin (LIPITOR) 80 MG tablet Take 1 tablet (80 mg total) by mouth daily at 6 PM. 02/14/18  Yes Charolette Forward, MD  b complex-vitamin c-folic acid (NEPHRO-VITE) 0.8 MG TABS tablet Take 1 tablet by mouth daily.   Yes [provider]  levothyroxine (SYNTHROID, LEVOTHROID) 300 MCG tablet Take 300 mcg by mouth daily before breakfast.    Yes [provider]  losartan (COZAAR) 25 MG tablet Take 0.5 tablets (12.5 mg total) by  mouth daily. 02/15/18  Yes Charolette Forward, MD  omeprazole (PRILOSEC) 40 MG capsule Take 1 capsule by mouth daily.   Yes [provider]  sevelamer carbonate (RENVELA) 800 MG tablet Take 2,400-3,200 mg by mouth 3 (three) times daily with meals. 3200mg  with meals and 2400mg  with snacks   Yes [provider]  ticagrelor (BRILINTA) 90 MG TABS tablet Take 1 tablet (90 mg total) by mouth 2 (two) times daily. 02/14/18  Yes Charolette Forward, MD  calcium carbonate (OS-CAL) 1250 (500 Ca) MG chewable tablet Chew 1 tablet by mouth 2 (two) times daily as needed.    [provider]  clindamycin (CLEOCIN) 300 MG capsule Take 1 capsule (300 mg total) by mouth 4 (four) times daily. X 7 days 10/27/18   Julianne Rice, MD  EPINEPHrine 0.3 mg/0.3 mL IJ SOAJ injection Inject 0.3 mg into the muscle as needed.    [provider]  nitroGLYCERIN (NITROSTAT) 0.4 MG SL tablet Place 1 tablet (0.4 mg total) under the tongue every 5 (five) minutes x 3 doses as needed for chest pain. 02/14/18   Charolette Forward, MD  traMADol (ULTRAM) 50 MG tablet Take 1 tablet (50 mg total) by mouth every 6 (six) hours as needed for severe pain. 10/26/18   Julianne Rice, MD    Family History Family History  Problem Relation Age of Onset  . Coronary artery disease Mother   . Kidney disease Father   . Diabetes Sister     Social History Social History   Tobacco Use  . Smoking status: Former Research scientist (life sciences)  . Smokeless tobacco: Never Used  Substance Use Topics  . Alcohol use: No  . Drug use: No     Allergies   Activase [alteplase]; Bee pollen; and Warfarin sodium   Review of Systems Review of Systems  Constitutional: Negative for chills and fever.  Respiratory: Positive for cough, shortness of breath and wheezing.   Cardiovascular: Negative for chest pain and leg swelling.  Gastrointestinal: Negative for abdominal pain, nausea and vomiting.  Musculoskeletal: Negative for back pain and neck pain.  Skin:  Positive for wound.  Neurological: Negative for weakness, light-headedness, numbness and headaches.  All other systems reviewed and are negative.    Physical Exam Updated Vital Signs BP 136/71   Pulse (!) 36   Temp (!) 96.9 F (36.1 C) (Temporal)   Resp (!) 22   Ht 5\' 3"  (1.6 m)   Wt 102.2 kg   SpO2 96%   BMI 39.91 kg/m   Physical Exam  Constitutional: She is oriented to person, place, and time. She appears well-developed and well-nourished.  Chronically ill-appearing  HENT:  Head: Normocephalic and atraumatic.  Mouth/Throat: Oropharynx is clear and moist.  Eyes: Pupils are equal, round,  and reactive to light. EOM are normal.  Neck: Normal range of motion. Neck supple.  Cardiovascular: Normal rate and regular rhythm.  Pulmonary/Chest:  Increased respiratory effort.  Audible wheezing throughout.  Abdominal: Soft. Bowel sounds are normal. There is no tenderness. There is no rebound and no guarding.  Musculoskeletal: Normal range of motion. She exhibits tenderness. She exhibits no edema.  Patient has swelling and tenderness to the index finger of the right hand especially along the lateral surface.  Has crusted over wound at the distal tip of the index finger adjacent to the nail.  No fluctuant masses appreciated.  No obvious erythema or warmth.  Patient has decreased range of motion of the right finger.  Also noted on the palmar surface of the right hand is a crusted over lesion.  No foreign bodies appreciated.  No erythema or fluctuance.  Neurological: She is alert and oriented to person, place, and time.  Moving all extremities without focal deficit.  Sensation intact.  Skin: Skin is warm and dry. No rash noted. No erythema.  Psychiatric: She has a normal mood and affect. Her behavior is normal.  Nursing note and vitals reviewed.    ED Treatments / Results  Labs (all labs ordered are listed, but only abnormal results are displayed) Labs Reviewed  COMPREHENSIVE METABOLIC  PANEL - Abnormal; Notable for the following components:      Result Value   Potassium 5.3 (*)    BUN 50 (*)    Creatinine, Ser 9.32 (*)    Calcium 7.9 (*)    AST 13 (*)    GFR calc non Af Amer 4 (*)    GFR calc Af Amer 5 (*)    All other components within normal limits  TROPONIN I - Abnormal; Notable for the following components:   Troponin I 0.03 (*)    All other components within normal limits  CBC WITH DIFFERENTIAL/PLATELET    EKG EKG Interpretation  Date/Time:  Sunday October 26 2018 14:40:27 EST Ventricular Rate:  94 PR Interval:    QRS Duration: 112 QT Interval:  446 QTC Calculation: 411 R Axis:   -43 Text Interpretation:  Sinus rhythm Ventricular bigeminy Incomplete left bundle branch block Low voltage, precordial leads Baseline wander in lead(s) II III aVF Confirmed by Julianne Rice [phone]) on 10/26/2018 4:47:44 PM   Radiology Dg Chest 2 View  Result Date: 10/26/2018 CLINICAL DATA:  Shortness of breath EXAM: CHEST - 2 VIEW COMPARISON:  CT and radiograph 02/09/2018, 09/10/2016 radiograph FINDINGS: Right-sided central venous catheter tip over the proximal right atrium. Cardiomegaly with central vascular congestion. No pleural effusion or focal consolidation. Negative for pneumothorax. Postsurgical changes at the thoracic inlet. Right axillary vascular stents. IMPRESSION: Cardiomegaly with vascular congestion. Electronically Signed   By: Donavan Foil M.D.   On: 10/26/2018 16:13   Dg Hand 2 View Right  Result Date: 10/26/2018 CLINICAL DATA:  The patient suffered a fall 1 week ago with a wound on the right hand on a due to a piece of wood. Question foreign body. EXAM: RIGHT HAND - 2 VIEW COMPARISON:  None. FINDINGS: There is no evidence of fracture or dislocation. Scattered interphalangeal osteoarthritic change noted. Soft tissues are unremarkable. IMPRESSION: Negative examination. No foreign body is identified. Please note the wood is not radiopaque. Electronically Signed    By: Inge Rise M.D.   On: 10/26/2018 12:12    Procedures Procedures (including critical care time)  Medications Ordered in ED Medications  albuterol (PROVENTIL) (2.5 MG/3ML)  0.083% nebulizer solution 5 mg (5 mg Nebulization Given 10/26/18 1452)  clindamycin (CLEOCIN) capsule 300 mg (300 mg Oral Given 10/26/18 1630)  traMADol (ULTRAM) tablet 50 mg (50 mg Oral Given 10/26/18 1629)     Initial Impression / Assessment and Plan / ED Course  I have reviewed the triage vital signs and the nursing notes.  Pertinent labs & imaging results that were available during my care of the patient were reviewed by me and considered in my medical decision making (see chart for details).     Patient chronically ill-appearing.  Concern for early infection of the right hand.  Also has diffuse wheezing throughout.  Will give nebulized treatment and reassess.  She is heart rate is in the 70s.  Bigeminy on EKG.  Patient states she is scheduled for hemodialysis tomorrow.  No respiratory distress after breathing treatment.  Maintaining saturations in the high 90s on room air.  X-ray of the right hand without acute findings.  Questionable early cellulitis of the right index finger.  Will start on antibiotics.  Advised immediate return for any worsening of her symptoms or concerns.  She voices understanding. Final Clinical Impressions(s) / ED Diagnoses   Final diagnoses:  Right hand pain  Wheezing    ED Discharge Orders         Ordered    clindamycin (CLEOCIN) 300 MG capsule  4 times daily     10/26/18 1646    traMADol (ULTRAM) 50 MG tablet  Every 6 hours PRN     10/26/18 1646           Julianne Rice, MD 10/26/18 1648

## 2018-12-25 ENCOUNTER — Encounter: Payer: Self-pay | Admitting: Internal Medicine

## 2018-12-25 ENCOUNTER — Ambulatory Visit (INDEPENDENT_AMBULATORY_CARE_PROVIDER_SITE_OTHER): Payer: Medicare Other | Admitting: Internal Medicine

## 2018-12-25 DIAGNOSIS — Z888 Allergy status to other drugs, medicaments and biological substances status: Secondary | ICD-10-CM | POA: Diagnosis not present

## 2018-12-25 DIAGNOSIS — S60410D Abrasion of right index finger, subsequent encounter: Secondary | ICD-10-CM

## 2018-12-25 DIAGNOSIS — Z87891 Personal history of nicotine dependence: Secondary | ICD-10-CM

## 2018-12-25 DIAGNOSIS — Z91030 Bee allergy status: Secondary | ICD-10-CM | POA: Diagnosis not present

## 2018-12-25 DIAGNOSIS — N186 End stage renal disease: Secondary | ICD-10-CM | POA: Diagnosis not present

## 2018-12-25 DIAGNOSIS — L089 Local infection of the skin and subcutaneous tissue, unspecified: Secondary | ICD-10-CM | POA: Diagnosis not present

## 2018-12-25 DIAGNOSIS — Z992 Dependence on renal dialysis: Secondary | ICD-10-CM | POA: Diagnosis not present

## 2018-12-25 DIAGNOSIS — W228XXD Striking against or struck by other objects, subsequent encounter: Secondary | ICD-10-CM | POA: Diagnosis not present

## 2018-12-25 DIAGNOSIS — Z7982 Long term (current) use of aspirin: Secondary | ICD-10-CM

## 2018-12-25 DIAGNOSIS — S60940A Unspecified superficial injury of right index finger, initial encounter: Principal | ICD-10-CM

## 2018-12-25 MED ORDER — DOXYCYCLINE HYCLATE 100 MG PO TABS: 100.0000 mg | ORAL_TABLET | Freq: Two times a day (BID) | ORAL | 1 refills | Status: DC

## 2018-12-25 MED ORDER — AMOXICILLIN-POT CLAVULANATE 500-125 MG PO TABS: 1.0000 | ORAL_TABLET | Freq: Every day | ORAL | 1 refills | Status: DC

## 2018-12-25 NOTE — Progress Notes (Signed)
[facility]  Reason for Consult: Right index finger infection with possible osteomyelitis Referring Provider: Dr. Milly Jakob  Assessment: She has persistent soft tissue infection of her distal right index finger and may have early osteomyelitis and septic arthritis.  She has had significant improvement with 2 short courses of empiric antibiotic therapy.  I will check her inflammatory markers today and start her on doxycycline and amoxicillin clavulanate.  I will continue her antibiotics at least until she follows up here in 4 weeks.  Plan: 1. Check sed rate and C-reactive protein 2. Start doxycycline and amoxicillin clavulanate 3. Follow-up in 4 weeks  Patient Active Problem List   Diagnosis Date Noted  . Superficial injury of right index finger with infection 12/25/2018    Priority: High  . Lymphedema 04/21/2018  . Chronic venous insufficiency 04/21/2018  . Non-STEMI (non-ST elevated myocardial infarction) ([place]) 02/09/2018  . Hypocalcemia 03/27/2014  . ESRD on dialysis ([place]) 03/27/2014  . Hypothyroidism 03/27/2014  . Chronic abdominal wound infection 03/27/2014  . Calciphylaxis 03/27/2014  . ATHEROSCLEROTIC CARDIOVASCULAR DISEASE 09/22/2010  . SYNCOPE 07/16/2010  . POSTURAL LIGHTHEADEDNESS 07/16/2010  . OBESITY 07/14/2010  . GASTROESOPHAGEAL REFLUX DISEASE 07/14/2010  . PALPITATIONS 07/14/2010  . HYPERPARATHYROIDISM, HX OF 07/14/2010    Patient's Medications  New Prescriptions   AMOXICILLIN-CLAVULANATE (AUGMENTIN) 500-125 MG TABLET    Take 1 tablet (500 mg total) by mouth at bedtime.   DOXYCYCLINE (VIBRA-TABS) 100 MG TABLET    Take 1 tablet (100 mg total) by mouth 2 (two) times daily.  Previous Medications   ALBUTEROL (PROVENTIL HFA;VENTOLIN HFA) 108 (90 BASE) MCG/ACT INHALER    Inhale 2 puffs into the lungs every 6 (six) hours as needed. Shortness of breath   AMLODIPINE (NORVASC) 10 MG TABLET    Take 1 tablet by mouth daily.   ASPIRIN EC 81 MG EC TABLET    Take 1 tablet (81 mg total) by mouth daily.   ATORVASTATIN (LIPITOR) 80 MG TABLET    Take 1 tablet (80 mg total) by mouth daily at 6 PM.   B COMPLEX-VITAMIN C-FOLIC ACID (NEPHRO-VITE) 0.8 MG TABS TABLET    Take 1 tablet by mouth daily.   CALCIUM CARBONATE (OS-CAL) 1250 (500 CA) MG CHEWABLE TABLET    Chew 1 tablet by mouth 2 (two) times daily as needed.   CLINDAMYCIN (CLEOCIN) 300 MG CAPSULE    Take 1 capsule (300 mg total) by mouth 4 (four) times daily. X 7 days   EPINEPHRINE 0.3 MG/0.3 ML IJ SOAJ INJECTION    Inject 0.3 mg into the muscle as needed.   LEVOTHYROXINE (SYNTHROID, LEVOTHROID) 300 MCG TABLET    Take 300 mcg by mouth daily before breakfast.    LOSARTAN (COZAAR) 25 MG TABLET    Take 0.5 tablets (12.5 mg total) by mouth daily.   NITROGLYCERIN (NITROSTAT) 0.4 MG SL TABLET    Place 1 tablet (0.4 mg total) under the tongue every 5 (five) minutes x 3 doses as needed for chest pain.   OMEPRAZOLE (PRILOSEC) 40 MG CAPSULE    Take 1 capsule by mouth daily.   SEVELAMER CARBONATE (RENVELA) 800 MG TABLET    Take 2,400-3,200 mg by mouth 3 (three) times daily with meals. 3200mg  with meals and 2400mg  with snacks   TICAGRELOR (BRILINTA) 90 MG TABS TABLET    Take 1 tablet (90 mg total) by mouth 2 (two) times daily.   TRAMADOL (ULTRAM) 50 MG TABLET  Take 1 tablet (50 mg total) by mouth every 6 (six) hours as needed for severe pain.  Modified Medications   No medications on file  Discontinued Medications   No medications on file    HPI: Lori [name] is a 48 y.o. female with end-stage renal disease on hemodialysis who injured her right index finger while putting some wood in her woodstove in late November.  She hit the medial tip of her index finger on the door to the [place] causing a superficial abrasion.  She cleaned the area and put a Band-Aid on it but over the next week she had diffuse swelling, redness and pain in her finger.  On a few occasions she noted a little  bit of yellow pus draining from the area of her original injury.  She was evaluated in the [facility] emergency department on 10/26/2018.  The lacerated area was noted to be "crusted" over.  A CBC was normal.  Plain x-ray revealed osteoarthritic changes in the interphalangeal joints but no clear evidence of infection.  She was treated with a 7-day course of oral clindamycin and noted significant improvement.  The more diffuse swelling and redness in her finger receded but she continued to have swelling and redness of her distal finger.  She was reevaluated by her nephrologist and placed on doxycycline for 5 days.  She again noted some partial improvement.  She was referred to see Dr. Milly Jakob on 12/16/2018.  X-rays done at that time showed "bony changes in the DIP joint questionable for osteomyelitis".  Review of Systems: Review of Systems  Constitutional: Negative for chills, diaphoresis and fever.  Musculoskeletal: Positive for joint pain.      Past Medical History:  Diagnosis Date  . Anemia   . ASCVD (arteriosclerotic cardiovascular disease)   . Calciphylaxis   . Chronic abdominal wound infection   . Dialysis patient ([place])   . ESRD (end stage renal disease) ([place])    Due to membranous GN dialysis 09/1996; peritoneal dialysis --? peitonitis; difficult vascular access  . GERD (gastroesophageal reflux disease)   . Hashimoto thyroiditis   . Hyperparathyroidism   . Hypertension   . Hypothyroidism   . Medically noncompliant   . Morbid obesity ([place])     Social History   Tobacco Use  . Smoking status: Former Research scientist (life sciences)  . Smokeless tobacco: Never Used  Substance Use Topics  . Alcohol use: No  . Drug use: No    Family History  Problem Relation Age of Onset  . Coronary artery disease Mother   . Kidney disease Father   . Diabetes Sister    Allergies  Allergen Reactions  . Activase [Alteplase] Shortness Of Breath  . Bee Pollen Anaphylaxis  . Warfarin Sodium Nausea And Vomiting  and Rash    OBJECTIVE: Vitals:   12/25/18 1612  Temp: (!) 97.5 F (36.4 C)  Weight: 228 lb (103.4 kg)   Body mass index is 40.39 kg/m.   Physical Exam Constitutional:      Comments: She is very pleasant and in good spirits.  Musculoskeletal:     Comments: She has swelling and redness of her distal right index finger.  There is a scabbed area immediately about 10 mm in diameter.  She has some restricted flexion.  Her nailbed is intact.  There is no drainage or odor.     Microbiology: No results found for this or any previous visit (from the past 240 hour(s)).  Michel Bickers, MD [facility] for  Infectious Disease [facility] [phone] pager   [phone] cell 12/25/2018, 4:36 PM

## 2018-12-26 LAB — SEDIMENTATION RATE: Sed Rate: 11 mm/h (ref 0–20)

## 2018-12-26 LAB — C-REACTIVE PROTEIN: CRP: 2.5 mg/L (ref <8.0)

## 2018-12-29 ENCOUNTER — Encounter (HOSPITAL_COMMUNITY): Payer: Medicare Other

## 2018-12-31 ENCOUNTER — Telehealth: Payer: Self-pay | Admitting: Internal Medicine

## 2018-12-31 NOTE — Telephone Encounter (Signed)
Lori Jakob, MD  Michel Bickers, MD        Thanks for caring for this patient. To help clarify, xrays in our office showed some minimal cortical erosion of the distal phalanx, in the region of the soft tissue scab, but was not thought to have been a septic arthritis that extended into osteo. Hopefully protracted antibiotics will result in a cure without having to amputate her digit through the DIPJ.   Lori [name]

## 2019-01-27 ENCOUNTER — Encounter: Payer: Self-pay | Admitting: Internal Medicine

## 2019-01-27 ENCOUNTER — Ambulatory Visit (INDEPENDENT_AMBULATORY_CARE_PROVIDER_SITE_OTHER): Payer: Medicare Other | Admitting: Internal Medicine

## 2019-01-27 ENCOUNTER — Other Ambulatory Visit: Payer: Self-pay

## 2019-01-27 DIAGNOSIS — L089 Local infection of the skin and subcutaneous tissue, unspecified: Secondary | ICD-10-CM

## 2019-01-27 DIAGNOSIS — Z7982 Long term (current) use of aspirin: Secondary | ICD-10-CM

## 2019-01-27 DIAGNOSIS — Z91030 Bee allergy status: Secondary | ICD-10-CM

## 2019-01-27 DIAGNOSIS — S60940D Unspecified superficial injury of right index finger, subsequent encounter: Secondary | ICD-10-CM

## 2019-01-27 DIAGNOSIS — Z87891 Personal history of nicotine dependence: Secondary | ICD-10-CM

## 2019-01-27 DIAGNOSIS — Z888 Allergy status to other drugs, medicaments and biological substances status: Secondary | ICD-10-CM

## 2019-01-27 DIAGNOSIS — X58XXXD Exposure to other specified factors, subsequent encounter: Secondary | ICD-10-CM

## 2019-01-27 DIAGNOSIS — S60940A Unspecified superficial injury of right index finger, initial encounter: Principal | ICD-10-CM

## 2019-01-27 MED ORDER — CLINDAMYCIN HCL 300 MG PO CAPS: 300.0000 mg | ORAL_CAPSULE | Freq: Three times a day (TID) | ORAL | 1 refills | Status: DC

## 2019-01-27 MED ORDER — LEVOFLOXACIN 500 MG PO TABS: 500.0000 mg | ORAL_TABLET | Freq: Every day | ORAL | 1 refills | Status: DC

## 2019-01-27 NOTE — Assessment & Plan Note (Signed)
She is improving slowly.  Since she had a reaction to amoxicillin clavulanate I would like to change her empiric antibiotic therapy to provide some anaerobic coverage.  I will treat her with clindamycin and levofloxacin for 2 more weeks.  She will follow-up here in 6 weeks.

## 2019-01-27 NOTE — Progress Notes (Signed)
[facility] for Infectious Disease  Patient Active Problem List   Diagnosis Date Noted  . Superficial injury of right index finger with infection 12/25/2018    Priority: High  . Lymphedema 04/21/2018  . Chronic venous insufficiency 04/21/2018  . Non-STEMI (non-ST elevated myocardial infarction) ([place]) 02/09/2018  . Hypocalcemia 03/27/2014  . ESRD on dialysis ([place]) 03/27/2014  . Hypothyroidism 03/27/2014  . Chronic abdominal wound infection 03/27/2014  . Calciphylaxis 03/27/2014  . ATHEROSCLEROTIC CARDIOVASCULAR DISEASE 09/22/2010  . SYNCOPE 07/16/2010  . POSTURAL LIGHTHEADEDNESS 07/16/2010  . OBESITY 07/14/2010  . GASTROESOPHAGEAL REFLUX DISEASE 07/14/2010  . PALPITATIONS 07/14/2010  . HYPERPARATHYROIDISM, HX OF 07/14/2010    Patient's Medications  New Prescriptions   CLINDAMYCIN (CLEOCIN) 300 MG CAPSULE    Take 1 capsule (300 mg total) by mouth 3 (three) times daily.   LEVOFLOXACIN (LEVAQUIN) 500 MG TABLET    Take 1 tablet (500 mg total) by mouth daily.  Previous Medications   ALBUTEROL (PROVENTIL HFA;VENTOLIN HFA) 108 (90 BASE) MCG/ACT INHALER    Inhale 2 puffs into the lungs every 6 (six) hours as needed. Shortness of breath   AMLODIPINE (NORVASC) 10 MG TABLET    Take 1 tablet by mouth daily.   ASPIRIN EC 81 MG EC TABLET    Take 1 tablet (81 mg total) by mouth daily.   ATORVASTATIN (LIPITOR) 80 MG TABLET    Take 1 tablet (80 mg total) by mouth daily at 6 PM.   B COMPLEX-VITAMIN C-FOLIC ACID (NEPHRO-VITE) 0.8 MG TABS TABLET    Take 1 tablet by mouth daily.   CALCIUM CARBONATE (OS-CAL) 1250 (500 CA) MG CHEWABLE TABLET    Chew 1 tablet by mouth 2 (two) times daily as needed.   EPINEPHRINE 0.3 MG/0.3 ML IJ SOAJ INJECTION    Inject 0.3 mg into the muscle as needed.   LEVOTHYROXINE (SYNTHROID, LEVOTHROID) 300 MCG TABLET    Take 300 mcg by mouth daily before breakfast.    LOSARTAN (COZAAR) 25 MG TABLET    Take 0.5 tablets (12.5 mg total) by mouth daily.   NITROGLYCERIN (NITROSTAT) 0.4 MG SL TABLET    Place 1 tablet (0.4 mg total) under the tongue every 5 (five) minutes x 3 doses as needed for chest pain.   OMEPRAZOLE (PRILOSEC) 40 MG CAPSULE    Take 1 capsule by mouth daily.   SEVELAMER CARBONATE (RENVELA) 800 MG TABLET    Take 2,400-3,200 mg by mouth 3 (three) times daily with meals. 3200mg  with meals and 2400mg  with snacks   TICAGRELOR (BRILINTA) 90 MG TABS TABLET    Take 1 tablet (90 mg total) by mouth 2 (two) times daily.   TRAMADOL (ULTRAM) 50 MG TABLET    Take 1 tablet (50 mg total) by mouth every 6 (six) hours as needed for severe pain.  Modified Medications   No medications on file  Discontinued Medications   AMOXICILLIN-CLAVULANATE (AUGMENTIN) 500-125 MG TABLET    Take 1 tablet (500 mg total) by mouth at bedtime.   CLINDAMYCIN (CLEOCIN) 300 MG CAPSULE    Take 1 capsule (300 mg total) by mouth 4 (four) times daily. X 7 days   DOXYCYCLINE (VIBRA-TABS) 100 MG TABLET    Take 1 tablet (100 mg total) by mouth 2 (two) times daily.    Subjective: [name] is in for her routine follow-up visit.  She developed early osteomyelitis of her right index finger after injuring her hand while putting a piece of wood  into her woodstove.  I placed her on oral amoxicillin clavulanate and doxycycline when I saw her 1 month ago.  After about 10 days she developed severe blisters in her mouth and her nephrologist, Dr. Lavona Mound, told her to stop amoxicillin clavulanate.  Her blisters resolved fairly promptly.  She is only been on doxycycline since that time.  Unfortunately, I was never told about this reaction.  She has had improvement in the swelling and pain but still has some swelling distally over her index finger proximal to the nailbed.  Review of Systems: Review of Systems  Constitutional: Negative for chills, diaphoresis and fever.  Gastrointestinal: Negative for abdominal pain, diarrhea, nausea and vomiting.  Musculoskeletal: Positive for joint pain.     Past Medical History:  Diagnosis Date  . Anemia   . ASCVD (arteriosclerotic cardiovascular disease)   . Calciphylaxis   . Chronic abdominal wound infection   . Dialysis patient ([place])   . ESRD (end stage renal disease) ([place])    Due to membranous GN dialysis 09/1996; peritoneal dialysis --? peitonitis; difficult vascular access  . GERD (gastroesophageal reflux disease)   . Hashimoto thyroiditis   . Hyperparathyroidism   . Hypertension   . Hypothyroidism   . Medically noncompliant   . Morbid obesity ([place])     Social History   Tobacco Use  . Smoking status: Former Research scientist (life sciences)  . Smokeless tobacco: Never Used  Substance Use Topics  . Alcohol use: No  . Drug use: No    Family History  Problem Relation Age of Onset  . Coronary artery disease Mother   . Kidney disease Father   . Diabetes Sister     Allergies  Allergen Reactions  . Activase [Alteplase] Shortness Of Breath  . Bee Pollen Anaphylaxis  . Warfarin Sodium Nausea And Vomiting and Rash    Objective: Vitals:   01/27/19 0853  Weight: 232 lb 4.8 oz (105.4 kg)  Height: 5\' 3"  (1.6 m)   Body mass index is 41.15 kg/m.  Physical Exam Constitutional:      Comments: She is pleasant and in good spirits.  Musculoskeletal:     Comments: The scabbed area on the medial tip of her right index finger is healing slowly.  She still has some swelling and faint erythema just proximal to the nailbed.  This has improved.  Her range of motion has improved.         Lab Results Sed Rate (mm/h)  Date Value  12/25/2018 11   CRP (mg/L)  Date Value  12/25/2018 2.5     Problem List Items Addressed This Visit      High   Superficial injury of right index finger with infection    She is improving slowly.  Since she had a reaction to amoxicillin clavulanate I would like to change her empiric antibiotic therapy to provide some anaerobic coverage.  I will treat her with clindamycin and levofloxacin for 2 more weeks.  She  will follow-up here in 6 weeks.      Relevant Medications   clindamycin (CLEOCIN) 300 MG capsule   levofloxacin (LEVAQUIN) 500 MG tablet       Michel Bickers, MD [facility] for Infectious [facility] [phone] pager   [phone] cell 01/27/2019, 9:20 AM

## 2019-02-02 ENCOUNTER — Emergency Department (HOSPITAL_COMMUNITY)
Admission: EM | Admit: 2019-02-02 | Discharge: 2019-02-02 | Disposition: A | Discharge status: Home / Self Care | Payer: Medicare Other | Attending: Emergency Medicine | Admitting: Emergency Medicine

## 2019-02-02 ENCOUNTER — Other Ambulatory Visit: Payer: Self-pay

## 2019-02-02 ENCOUNTER — Encounter (HOSPITAL_COMMUNITY): Payer: Self-pay | Admitting: Emergency Medicine

## 2019-02-02 ENCOUNTER — Emergency Department (HOSPITAL_COMMUNITY): Payer: Medicare Other

## 2019-02-02 DIAGNOSIS — E039 Hypothyroidism, unspecified: Secondary | ICD-10-CM | POA: Insufficient documentation

## 2019-02-02 DIAGNOSIS — Z7982 Long term (current) use of aspirin: Secondary | ICD-10-CM | POA: Diagnosis not present

## 2019-02-02 DIAGNOSIS — Z992 Dependence on renal dialysis: Secondary | ICD-10-CM | POA: Insufficient documentation

## 2019-02-02 DIAGNOSIS — Z87891 Personal history of nicotine dependence: Secondary | ICD-10-CM | POA: Insufficient documentation

## 2019-02-02 DIAGNOSIS — Z79899 Other long term (current) drug therapy: Secondary | ICD-10-CM | POA: Insufficient documentation

## 2019-02-02 DIAGNOSIS — M79604 Pain in right leg: Secondary | ICD-10-CM | POA: Diagnosis not present

## 2019-02-02 DIAGNOSIS — M79605 Pain in left leg: Secondary | ICD-10-CM | POA: Insufficient documentation

## 2019-02-02 DIAGNOSIS — N186 End stage renal disease: Secondary | ICD-10-CM | POA: Insufficient documentation

## 2019-02-02 DIAGNOSIS — R600 Localized edema: Secondary | ICD-10-CM | POA: Insufficient documentation

## 2019-02-02 DIAGNOSIS — I120 Hypertensive chronic kidney disease with stage 5 chronic kidney disease or end stage renal disease: Secondary | ICD-10-CM | POA: Diagnosis not present

## 2019-02-02 LAB — CBC WITH DIFFERENTIAL/PLATELET
Abs Immature Granulocytes: 0.04 10*3/uL (ref 0.00–0.07)
Basophils Absolute: 0.1 10*3/uL (ref 0.0–0.1)
Basophils Relative: 1 %
Eosinophils Absolute: 0.3 10*3/uL (ref 0.0–0.5)
Eosinophils Relative: 7 %
HCT: 40.2 % (ref 36.0–46.0)
Hemoglobin: 12.5 g/dL (ref 12.0–15.0)
Immature Granulocytes: 1 %
Lymphocytes Relative: 26 %
Lymphs Abs: 1.3 10*3/uL (ref 0.7–4.0)
MCH: 30.6 pg (ref 26.0–34.0)
MCHC: 31.1 g/dL (ref 30.0–36.0)
MCV: 98.3 fL (ref 80.0–100.0)
Monocytes Absolute: 0.5 10*3/uL (ref 0.1–1.0)
Monocytes Relative: 10 %
Neutro Abs: 2.8 10*3/uL (ref 1.7–7.7)
Neutrophils Relative %: 55 %
Platelets: 149 10*3/uL — ABNORMAL LOW (ref 150–400)
RBC: 4.09 MIL/uL (ref 3.87–5.11)
RDW: 14.7 % (ref 11.5–15.5)
WBC: 5.1 10*3/uL (ref 4.0–10.5)
nRBC: 0 % (ref 0.0–0.2)

## 2019-02-02 LAB — BASIC METABOLIC PANEL
Anion gap: 11 (ref 5–15)
BUN: 23 mg/dL — ABNORMAL HIGH (ref 6–20)
CO2: 28 mmol/L (ref 22–32)
Calcium: 8 mg/dL — ABNORMAL LOW (ref 8.9–10.3)
Chloride: 101 mmol/L (ref 98–111)
Creatinine, Ser: 6.63 mg/dL — ABNORMAL HIGH (ref 0.44–1.00)
GFR calc Af Amer: 8 mL/min — ABNORMAL LOW (ref >60)
GFR calc non Af Amer: 7 mL/min — ABNORMAL LOW (ref >60)
Glucose, Bld: 85 mg/dL (ref 70–99)
Potassium: 4.2 mmol/L (ref 3.5–5.1)
Sodium: 140 mmol/L (ref 135–145)

## 2019-02-02 MED ORDER — OXYCODONE-ACETAMINOPHEN 5-325 MG PO TABS
1.0000 | ORAL_TABLET | Freq: Once | ORAL | Status: AC
  Administered 2019-02-02: 1 via ORAL
  Filled 2019-02-02: qty 1

## 2019-02-02 MED ORDER — HYDROCODONE-ACETAMINOPHEN 5-325 MG PO TABS: 1.0000 | ORAL_TABLET | Freq: Four times a day (QID) | ORAL | 0 refills | Status: DC | PRN

## 2019-02-02 NOTE — Discharge Instructions (Addendum)
Find a primary care doctor to follow-up for your lower extremity discomfort

## 2019-02-02 NOTE — ED Triage Notes (Signed)
Pain in RT leg since Saturday that has moved to her calf. All over body aches started today.

## 2019-02-02 NOTE — ED Provider Notes (Signed)
[facility] EMERGENCY DEPARTMENT Provider Note   CSN: [id number] Arrival date & time: 02/02/19  1634    History   Chief Complaint Chief Complaint  Patient presents with  . Leg Pain    HPI Lori [name] is a 48 y.o. female.     Patient complains of right leg pain and now left leg pain she is a dialysis patient and has significant swelling to her legs.  She states she always had swelling  The history is provided by the patient.  Leg Pain  Lower extremity pain location: Tired of the right leg and distal half of left leg. Injury: no   Pain details:    Quality:  Aching   Radiates to:  Does not radiate   Severity:  Moderate   Onset quality:  Gradual   Timing:  Constant   Progression:  Worsening Chronicity:  New Associated symptoms: no back pain and no fatigue     Past Medical History:  Diagnosis Date  . Anemia   . ASCVD (arteriosclerotic cardiovascular disease)   . Calciphylaxis   . Chronic abdominal wound infection   . Dialysis patient ([place])   . ESRD (end stage renal disease) ([place])    Due to membranous GN dialysis 09/1996; peritoneal dialysis --? peitonitis; difficult vascular access  . GERD (gastroesophageal reflux disease)   . Hashimoto thyroiditis   . Hyperparathyroidism   . Hypertension   . Hypothyroidism   . Medically noncompliant   . Morbid obesity [facility])     Patient Active Problem List   Diagnosis Date Noted  . Superficial injury of right index finger with infection 12/25/2018  . Lymphedema 04/21/2018  . Chronic venous insufficiency 04/21/2018  . Non-STEMI (non-ST elevated myocardial infarction) ([place]) 02/09/2018  . Hypocalcemia 03/27/2014  . ESRD on dialysis ([place]) 03/27/2014  . Hypothyroidism 03/27/2014  . Chronic abdominal wound infection 03/27/2014  . Calciphylaxis 03/27/2014  . ATHEROSCLEROTIC CARDIOVASCULAR DISEASE 09/22/2010  . SYNCOPE 07/16/2010  . POSTURAL LIGHTHEADEDNESS 07/16/2010  . OBESITY 07/14/2010  . GASTROESOPHAGEAL REFLUX DISEASE  07/14/2010  . PALPITATIONS 07/14/2010  . HYPERPARATHYROIDISM, HX OF 07/14/2010    Past Surgical History:  Procedure Laterality Date  . [place]  . LEFT HEART CATH AND CORONARY ANGIOGRAPHY N/A 02/11/2018   Procedure: LEFT HEART CATH AND CORONARY ANGIOGRAPHY;  Surgeon: Charolette Forward, MD;  Location: [facility];  Service: Cardiovascular;  Laterality: N/A;  . THYROIDECTOMY, PARTIAL     Resectin of left lobe with reimplantation in the forearm, small focus of papillary carcinoma incidentally noted at pathology - 2000 and Hashimoto's thyrdoiditis in 2001  . TONSILLECTOMY AND ADENOIDECTOMY       OB History    Gravida  1   Para  1   Term      Preterm  1   AB      Living        SAB      TAB      Ectopic      Multiple      Live Births               Home Medications    Prior to Admission medications   Medication Sig Start Date End Date Taking? Authorizing Provider  aspirin EC 81 MG EC tablet Take 1 tablet (81 mg total) by mouth daily. 02/15/18  Yes Charolette Forward, MD  atorvastatin (LIPITOR) 80 MG tablet Take 1 tablet (80 mg total) by mouth daily at 6 PM. 02/14/18  Yes Harwani,  Prudencio Burly, MD  b complex-vitamin c-folic acid (NEPHRO-VITE) 0.8 MG TABS tablet Take 1 tablet by mouth daily.   Yes [provider]  clindamycin (CLEOCIN) 300 MG capsule Take 1 capsule (300 mg total) by mouth 3 (three) times daily. 01/27/19  Yes Michel Bickers, MD  EPINEPHrine 0.3 mg/0.3 mL IJ SOAJ injection Inject 0.3 mg into the muscle as needed.   Yes [provider]  levofloxacin (LEVAQUIN) 500 MG tablet Take 1 tablet (500 mg total) by mouth daily. 01/27/19  Yes Michel Bickers, MD  levothyroxine (SYNTHROID, LEVOTHROID) 300 MCG tablet Take 300 mcg by mouth daily before breakfast. SYNTHROID ONLY-BRAND NAME MEDICALLY NECESSARY   Yes [provider]  losartan (COZAAR) 25 MG tablet Take 0.5 tablets (12.5 mg total) by mouth daily. 02/15/18  Yes Charolette Forward, MD  nitroGLYCERIN  (NITROSTAT) 0.4 MG SL tablet Place 1 tablet (0.4 mg total) under the tongue every 5 (five) minutes x 3 doses as needed for chest pain. 02/14/18  Yes Charolette Forward, MD  sevelamer carbonate (RENVELA) 800 MG tablet Take 2,400-3,200 mg by mouth 3 (three) times daily with meals. 3200mg  with meals and 2400mg  with snacks   Yes [provider]  ticagrelor (BRILINTA) 90 MG TABS tablet Take 1 tablet (90 mg total) by mouth 2 (two) times daily. 02/14/18  Yes Charolette Forward, MD  albuterol (PROVENTIL HFA;VENTOLIN HFA) 108 (90 BASE) MCG/ACT inhaler Inhale 2 puffs into the lungs every 6 (six) hours as needed. Shortness of breath    [provider]  HYDROcodone-acetaminophen (NORCO/VICODIN) 5-325 MG tablet Take 1 tablet by mouth every 6 (six) hours as needed. 02/02/19   Milton Ferguson, MD    Family History Family History  Problem Relation Age of Onset  . Coronary artery disease Mother   . Kidney disease Father   . Diabetes Sister     Social History Social History   Tobacco Use  . Smoking status: Former Research scientist (life sciences)  . Smokeless tobacco: Never Used  Substance Use Topics  . Alcohol use: No  . Drug use: No     Allergies   Activase [alteplase]; Bee pollen; and Warfarin sodium   Review of Systems Review of Systems  Constitutional: Negative for appetite change and fatigue.  HENT: Negative for congestion, ear discharge and sinus pressure.   Eyes: Negative for discharge.  Respiratory: Negative for cough.   Cardiovascular: Negative for chest pain.  Gastrointestinal: Negative for abdominal pain and diarrhea.  Genitourinary: Negative for frequency and hematuria.  Musculoskeletal: Negative for back pain.       Lower leg pain  Skin: Negative for rash.  Neurological: Negative for seizures and headaches.  Psychiatric/Behavioral: Negative for hallucinations.     Physical Exam Updated Vital Signs BP 103/77 (BP Location: Left Wrist)   Pulse 70   Temp 97.8 F (36.6 C) (Oral)   Resp 18    Ht 5\' 3"  (1.6 m)   Wt 105 kg   SpO2 97%   BMI 41.01 kg/m   Physical Exam Vitals signs and nursing note reviewed.  Constitutional:      Appearance: She is well-developed.  HENT:     Head: Normocephalic.     Nose: Nose normal.  Eyes:     General: No scleral icterus.    Conjunctiva/sclera: Conjunctivae normal.  Neck:     Musculoskeletal: Neck supple.     Thyroid: No thyromegaly.  Cardiovascular:     Rate and Rhythm: Normal rate and regular rhythm.     Heart sounds: No murmur. No  friction rub. No gallop.   Pulmonary:     Breath sounds: No stridor. No wheezing or rales.  Chest:     Chest wall: No tenderness.  Abdominal:     General: There is no distension.     Tenderness: There is no abdominal tenderness. There is no rebound.  Musculoskeletal: Normal range of motion.     Comments: Tenderness both lower legs with swelling.  Lymphadenopathy:     Cervical: No cervical adenopathy.  Skin:    Findings: No erythema or rash.  Neurological:     Mental Status: She is alert and oriented to person, place, and time.     Motor: No abnormal muscle tone.     Coordination: Coordination normal.  Psychiatric:        Behavior: Behavior normal.      ED Treatments / Results  Labs (all labs ordered are listed, but only abnormal results are displayed) Labs Reviewed  CBC WITH DIFFERENTIAL/PLATELET - Abnormal; Notable for the following components:      Result Value   Platelets 149 (*)    All other components within normal limits  BASIC METABOLIC PANEL - Abnormal; Notable for the following components:   BUN 23 (*)    Creatinine, Ser 6.63 (*)    Calcium 8.0 (*)    GFR calc non Af Amer 7 (*)    GFR calc Af Amer 8 (*)    All other components within normal limits    EKG None  Radiology Dg Chest 2 View  Result Date: 02/02/2019 CLINICAL DATA:  Right leg pain over the last week. Shortness of breath and cough. EXAM: CHEST - 2 VIEW COMPARISON:  10/26/2018 FINDINGS: Chronic cardiomegaly.  Central line tips in the SVC and right atrium. Pulmonary vascularity is normal. Lungs are clear. No effusions. Coronary artery calcification is noted. Surgical clips in the thoracic inlet. Right axillary vascular stents. IMPRESSION: No active finding. Cardiomegaly and coronary artery calcification. Central line. Right axillary vascular stents. Electronically Signed   By: Nelson Chimes M.D.   On: 02/02/2019 19:55   Dg Lumbar Spine Complete  Result Date: 02/02/2019 CLINICAL DATA:  Back pain EXAM: LUMBAR SPINE - COMPLETE 4+ VIEW COMPARISON:  CT 05/03/2013 FINDINGS: Alignment is normal. No evidence of regional fracture. Mild disc space narrowing L4-5 and L5-S1. Aortoiliac calcification incidentally noted. IMPRESSION: No acute lumbar finding. No evidence of fracture. Ordinary lower lumbar disc space narrowing L4-5 and L5-S1. Electronically Signed   By: Nelson Chimes M.D.   On: 02/02/2019 19:56    Procedures Procedures (including critical care time)  Medications Ordered in ED Medications  oxyCODONE-acetaminophen (PERCOCET/ROXICET) 5-325 MG per tablet 1 tablet (has no administration in time range)     Initial Impression / Assessment and Plan / ED Course  I have reviewed the triage vital signs and the nursing notes.  Pertinent labs & imaging results that were available during my care of the patient were reviewed by me and considered in my medical decision making (see chart for details).        Bilateral lower leg pain.  Suspect related to the swelling and kidney failure.  She will be given some Vicodin and will follow-up with her PCP Final Clinical Impressions(s) / ED Diagnoses   Final diagnoses:  Bilateral leg pain    ED Discharge Orders         Ordered    HYDROcodone-acetaminophen (NORCO/VICODIN) 5-325 MG tablet  Every 6 hours PRN     02/02/19 2051  Milton Ferguson, MD 02/02/19 2056

## 2019-03-07 ENCOUNTER — Emergency Department (HOSPITAL_COMMUNITY)
Admission: EM | Admit: 2019-03-07 | Discharge: 2019-03-07 | Disposition: A | Discharge status: Home / Self Care | Payer: Medicare Other | Attending: Emergency Medicine | Admitting: Emergency Medicine

## 2019-03-07 ENCOUNTER — Encounter (HOSPITAL_COMMUNITY): Payer: Self-pay | Admitting: Emergency Medicine

## 2019-03-07 ENCOUNTER — Emergency Department (HOSPITAL_COMMUNITY): Payer: Medicare Other

## 2019-03-07 ENCOUNTER — Other Ambulatory Visit: Payer: Self-pay

## 2019-03-07 DIAGNOSIS — Z87891 Personal history of nicotine dependence: Secondary | ICD-10-CM | POA: Insufficient documentation

## 2019-03-07 DIAGNOSIS — Z79899 Other long term (current) drug therapy: Secondary | ICD-10-CM | POA: Diagnosis not present

## 2019-03-07 DIAGNOSIS — E039 Hypothyroidism, unspecified: Secondary | ICD-10-CM | POA: Diagnosis not present

## 2019-03-07 DIAGNOSIS — Z992 Dependence on renal dialysis: Secondary | ICD-10-CM | POA: Diagnosis not present

## 2019-03-07 DIAGNOSIS — M25561 Pain in right knee: Secondary | ICD-10-CM | POA: Diagnosis present

## 2019-03-07 DIAGNOSIS — Z7982 Long term (current) use of aspirin: Secondary | ICD-10-CM | POA: Insufficient documentation

## 2019-03-07 DIAGNOSIS — I120 Hypertensive chronic kidney disease with stage 5 chronic kidney disease or end stage renal disease: Secondary | ICD-10-CM | POA: Diagnosis not present

## 2019-03-07 DIAGNOSIS — N186 End stage renal disease: Secondary | ICD-10-CM | POA: Diagnosis not present

## 2019-03-07 MED ORDER — OXYCODONE-ACETAMINOPHEN 5-325 MG PO TABS
1.0000 | ORAL_TABLET | Freq: Once | ORAL | Status: AC
  Administered 2019-03-07: 1 via ORAL
  Filled 2019-03-07: qty 1

## 2019-03-07 MED ORDER — OXYCODONE-ACETAMINOPHEN 5-325 MG PO TABS: 1.0000 | ORAL_TABLET | Freq: Four times a day (QID) | ORAL | 0 refills | Status: DC | PRN

## 2019-03-07 NOTE — ED Triage Notes (Signed)
Fell Thursday  "knees just give out on me"  Went to dialysis today and they "told me to come"  Here for eval

## 2019-03-07 NOTE — Discharge Instructions (Addendum)
You are seen in the emergency department for knee pain after a fall.  You had CAT scans that showed a lot of arthritis but no obvious fracture.  You should continue to ice her knee and use ibuprofen.  We are prescribing you some pain medicine for severe pain.  Please contact your primary care doctor and also follow-up with orthopedics.  The knee immobilizer brace may help improve your symptoms.

## 2019-03-07 NOTE — ED Provider Notes (Signed)
[facility] EMERGENCY DEPARTMENT Provider Note   CSN: [id number] Arrival date & time: 03/07/19  1227    History   Chief Complaint Chief Complaint  Patient presents with  . Knee Pain    R    HPI Lori [name] is a 48 y.o. female.  She has a history of end-stage renal disease with dialysis Monday Wednesday Friday.  She said she had a mechanical fall a few days ago where she struck her knee after her legs gave out on her.  Since then she has had severe knee pain worse with ambulation.  She went to dialysis yesterday and they told her she should come here for an evaluation.  No chest pain or shortness of breath no fever.     The history is provided by the patient.  Knee Pain  Location:  Knee Injury: yes   Mechanism of injury: fall   Fall:    Fall occurred:  [employer] of impact:  Knees   Entrapped after fall: no   Knee location:  R knee Pain details:    Quality:  Throbbing   Severity:  Severe   Onset quality:  Sudden   Timing:  Constant   Progression:  Unchanged Chronicity:  New Relieved by:  Nothing Worsened by:  Bearing weight Ineffective treatments:  Acetaminophen Associated symptoms: decreased ROM   Associated symptoms: no fever and no neck pain   Risk factors: obesity     Past Medical History:  Diagnosis Date  . Anemia   . ASCVD (arteriosclerotic cardiovascular disease)   . Calciphylaxis   . Chronic abdominal wound infection   . Dialysis patient ([place])   . ESRD (end stage renal disease) ([place])    Due to membranous GN dialysis 09/1996; peritoneal dialysis --? peitonitis; difficult vascular access  . GERD (gastroesophageal reflux disease)   . Hashimoto thyroiditis   . Hyperparathyroidism   . Hypertension   . Hypothyroidism   . Medically noncompliant   . Morbid obesity [facility])     Patient Active Problem List   Diagnosis Date Noted  . Superficial injury of right index finger with infection 12/25/2018  . Lymphedema 04/21/2018  . Chronic venous  insufficiency 04/21/2018  . Non-STEMI (non-ST elevated myocardial infarction) ([place]) 02/09/2018  . Hypocalcemia 03/27/2014  . ESRD on dialysis ([place]) 03/27/2014  . Hypothyroidism 03/27/2014  . Chronic abdominal wound infection 03/27/2014  . Calciphylaxis 03/27/2014  . ATHEROSCLEROTIC CARDIOVASCULAR DISEASE 09/22/2010  . SYNCOPE 07/16/2010  . POSTURAL LIGHTHEADEDNESS 07/16/2010  . OBESITY 07/14/2010  . GASTROESOPHAGEAL REFLUX DISEASE 07/14/2010  . PALPITATIONS 07/14/2010  . HYPERPARATHYROIDISM, HX OF 07/14/2010    Past Surgical History:  Procedure Laterality Date  . [place]  . LEFT HEART CATH AND CORONARY ANGIOGRAPHY N/A 02/11/2018   Procedure: LEFT HEART CATH AND CORONARY ANGIOGRAPHY;  Surgeon: Charolette Forward, MD;  Location: [facility];  Service: Cardiovascular;  Laterality: N/A;  . THYROIDECTOMY, PARTIAL     Resectin of left lobe with reimplantation in the forearm, small focus of papillary carcinoma incidentally noted at pathology - 2000 and Hashimoto's thyrdoiditis in 2001  . TONSILLECTOMY AND ADENOIDECTOMY       OB History    Gravida  1   Para  1   Term      Preterm  1   AB      Living        SAB      TAB      Ectopic  Multiple      Live Births               Home Medications    Prior to Admission medications   Medication Sig Start Date End Date Taking? Authorizing Provider  albuterol (PROVENTIL HFA;VENTOLIN HFA) 108 (90 BASE) MCG/ACT inhaler Inhale 2 puffs into the lungs every 6 (six) hours as needed. Shortness of breath    [provider]  aspirin EC 81 MG EC tablet Take 1 tablet (81 mg total) by mouth daily. 02/15/18   Charolette Forward, MD  atorvastatin (LIPITOR) 80 MG tablet Take 1 tablet (80 mg total) by mouth daily at 6 PM. 02/14/18   Charolette Forward, MD  b complex-vitamin c-folic acid (NEPHRO-VITE) 0.8 MG TABS tablet Take 1 tablet by mouth daily.    [provider]  clindamycin (CLEOCIN) 300 MG capsule Take 1 capsule  (300 mg total) by mouth 3 (three) times daily. 01/27/19   Michel Bickers, MD  EPINEPHrine 0.3 mg/0.3 mL IJ SOAJ injection Inject 0.3 mg into the muscle as needed.    [provider]  HYDROcodone-acetaminophen (NORCO/VICODIN) 5-325 MG tablet Take 1 tablet by mouth every 6 (six) hours as needed. 02/02/19   Milton Ferguson, MD  levofloxacin (LEVAQUIN) 500 MG tablet Take 1 tablet (500 mg total) by mouth daily. 01/27/19   Michel Bickers, MD  levothyroxine (SYNTHROID, LEVOTHROID) 300 MCG tablet Take 300 mcg by mouth daily before breakfast. SYNTHROID ONLY-BRAND NAME MEDICALLY NECESSARY    [provider]  losartan (COZAAR) 25 MG tablet Take 0.5 tablets (12.5 mg total) by mouth daily. 02/15/18   Charolette Forward, MD  nitroGLYCERIN (NITROSTAT) 0.4 MG SL tablet Place 1 tablet (0.4 mg total) under the tongue every 5 (five) minutes x 3 doses as needed for chest pain. 02/14/18   Charolette Forward, MD  sevelamer carbonate (RENVELA) 800 MG tablet Take 2,400-3,200 mg by mouth 3 (three) times daily with meals. 3200mg  with meals and 2400mg  with snacks    [provider]  ticagrelor (BRILINTA) 90 MG TABS tablet Take 1 tablet (90 mg total) by mouth 2 (two) times daily. 02/14/18   Charolette Forward, MD    Family History Family History  Problem Relation Age of Onset  . Coronary artery disease Mother   . Kidney disease Father   . Diabetes Sister     Social History Social History   Tobacco Use  . Smoking status: Former Research scientist (life sciences)  . Smokeless tobacco: Never Used  Substance Use Topics  . Alcohol use: No  . Drug use: No     Allergies   Activase [alteplase]; Bee pollen; and Warfarin sodium   Review of Systems Review of Systems  Constitutional: Negative for fever.  HENT: Negative for sore throat.   Eyes: Negative for visual disturbance.  Respiratory: Negative for shortness of breath.   Cardiovascular: Negative for chest pain.  Gastrointestinal: Negative for abdominal pain.  Genitourinary:  Negative for dysuria.  Musculoskeletal: Negative for neck pain.  Skin: Negative for rash.  Neurological: Negative for headaches.     Physical Exam Updated Vital Signs Ht 5\' 3"  (1.6 m)   Wt 120 kg   BMI 46.86 kg/m   Physical Exam Vitals signs and nursing note reviewed.  Constitutional:      General: She is not in acute distress.    Appearance: She is well-developed.  HENT:     Head: Normocephalic and atraumatic.  Eyes:     Conjunctiva/sclera: Conjunctivae normal.  Neck:     Musculoskeletal:  Neck supple.  Cardiovascular:     Rate and Rhythm: Normal rate and regular rhythm.     Heart sounds: No murmur.  Pulmonary:     Effort: Pulmonary effort is normal. No respiratory distress.     Breath sounds: Normal breath sounds.  Abdominal:     Palpations: Abdomen is soft.     Tenderness: There is no abdominal tenderness.  Musculoskeletal:        General: Tenderness present.     Comments: She has diffuse tenderness around the right knee and mild effusion.  No redness.  Pain is limiting her range of motion.  Nontender hip and ankle on that side.  Skin:    General: Skin is warm and dry.     Capillary Refill: Capillary refill takes less than 2 seconds.  Neurological:     General: No focal deficit present.     Mental Status: She is alert and oriented to person, place, and time.      ED Treatments / Results  Labs (all labs ordered are listed, but only abnormal results are displayed) Labs Reviewed - No data to display  EKG None  Radiology Ct Knee Right Wo Contrast  Result Date: 03/07/2019 CLINICAL DATA:  Knee pain status post fall 3 days ago. Possible medial femoral condyle fracture on radiographs. EXAM: CT OF THE RIGHT KNEE WITHOUT CONTRAST TECHNIQUE: Multidetector CT imaging of the right knee was performed according to the standard protocol. Multiplanar CT image reconstructions were also generated. COMPARISON:  Radiographs today and 01/01/2007. FINDINGS: Bones/Joint/Cartilage  The bones are demineralized. There is no evidence of acute fracture or dislocation. Specifically, the medial femoral condyle appears intact. There are tricompartmental degenerative changes which are most advanced in the lateral compartment where there is joint space narrowing, osteophyte and subchondral cyst formation. There is a probable intraosseous ganglion posteriorly in the lateral femoral condyle. There is a moderate to large knee joint effusion without definite intra-articular loose body. Ligaments Suboptimally assessed by CT. The anterior cruciate ligament is not well visualized. The PCL appears normal. Muscles and Tendons Intact extensor mechanism.  No focal muscular abnormalities. Soft tissues Mild subcutaneous edema surrounding the knee, greatest anteriorly. No focal fluid collection. Femoral popliteal atherosclerosis noted. IMPRESSION: 1. No evidence of acute fracture or dislocation. 2. Tricompartmental degenerative changes, most advanced in the lateral compartment. 3. Moderate to large knee joint effusion. Electronically Signed   By: Richardean Sale M.D.   On: 03/07/2019 16:11   Dg Knee Complete 4 Views Right  Result Date: 03/07/2019 CLINICAL DATA:  48 year old who fell 2 days ago and injured the RIGHT knee. Initial encounter. Patient unable to extend the knee joint. EXAM: RIGHT KNEE - COMPLETE 4+ VIEW COMPARISON:  01/01/2007. FINDINGS: Severe narrowing of the LATERAL and patellofemoral compartment joint spaces. Mild narrowing of the patellofemoral compartment joint space. Possible nondisplaced fracture involving the MEDIAL femoral condyle, only demonstrated on the oblique view. No fractures elsewhere. Large joint effusion/hemarthrosis. Previously identified amorphous sclerotic lesion involving the distal femoral metaphysis is less conspicuous on today's examination. Subchondral cysts involving the LATERAL femoral condyle are unchanged from the prior examination. No new intrinsic osseous  abnormalities. IMPRESSION: 1. Possible nondisplaced fracture involving the MEDIAL femoral condyle, only demonstrated on the oblique view. Please correlate with point tenderness. CT may be helpful to confirm or deny this finding. 2. Large joint effusion/hemarthrosis. 3. Previously identified amorphous sclerotic lesion involving the distal femoral metaphysis is less conspicuous on today's examination and is therefore benign. This may represent fibrous  dysplasia, a bone infarct or a low-grade cartilaginous lesion. Electronically Signed   By: Evangeline Dakin M.D.   On: 03/07/2019 14:42    Procedures Procedures (including critical care time)  Medications Ordered in ED Medications  oxyCODONE-acetaminophen (PERCOCET/ROXICET) 5-325 MG per tablet 1 tablet (has no administration in time range)     Initial Impression / Assessment and Plan / ED Course  I have reviewed the triage vital signs and the nursing notes.  Pertinent labs & imaging results that were available during my care of the patient were reviewed by me and considered in my medical decision making (see chart for details).  Clinical Course as of Mar 06 1725  Sat Mar 07, 2019  1334 Differential includes contusion, fracture, ligamentous injury, traumatic effusion   [MB]  1455 Patient's knee x-ray said possible femoral condyle fracture.  I updated the patient and have ordered her CT of her knee.  Pain is somewhat improved.   [MB]  2330 CT does not show any acute fracture but does show tricompartmental DJD and a moderate knee effusion.  We will see if knee immobilizer helps with her ambulation.  Anticipate discharge.   [MB]    Clinical Course User Index [MB] Hayden Rasmussen, MD        Final Clinical Impressions(s) / ED Diagnoses   Final diagnoses:  Acute pain of right knee    ED Discharge Orders         Ordered    oxyCODONE-acetaminophen (PERCOCET/ROXICET) 5-325 MG tablet  Every 6 hours PRN     03/07/19 1626            Hayden Rasmussen, MD 03/07/19 1727

## 2019-03-10 ENCOUNTER — Telehealth: Payer: Self-pay | Admitting: Orthopaedic Surgery

## 2019-03-10 NOTE — Telephone Encounter (Signed)
Lori [name] went to ED on Saturday, 03/07/19.  She did have a CT scan done of her knee.  She wants an appointment here in the office.  Do you want to see her?  If so, do you want her here in the office or do a virtual office visit?  Please Advise  Thanks

## 2019-03-12 NOTE — Telephone Encounter (Signed)
I can see in office if she still has effusion.

## 2019-03-12 NOTE — Telephone Encounter (Signed)
I have called Ms. Shanley back to relay the message but had to leave her a message

## 2019-03-16 ENCOUNTER — Other Ambulatory Visit: Payer: Self-pay

## 2019-03-16 ENCOUNTER — Ambulatory Visit: Payer: Medicare Other | Admitting: Internal Medicine

## 2019-03-17 ENCOUNTER — Encounter: Payer: Self-pay | Admitting: Orthopaedic Surgery

## 2019-03-17 ENCOUNTER — Other Ambulatory Visit: Payer: Self-pay

## 2019-03-17 ENCOUNTER — Ambulatory Visit (INDEPENDENT_AMBULATORY_CARE_PROVIDER_SITE_OTHER): Payer: Medicare Other | Admitting: Orthopaedic Surgery

## 2019-03-17 VITALS — Ht 63.0 in | Wt 265.4 lb

## 2019-03-17 DIAGNOSIS — G8929 Other chronic pain: Secondary | ICD-10-CM

## 2019-03-17 DIAGNOSIS — N186 End stage renal disease: Secondary | ICD-10-CM | POA: Diagnosis not present

## 2019-03-17 DIAGNOSIS — M25561 Pain in right knee: Secondary | ICD-10-CM | POA: Diagnosis not present

## 2019-03-17 DIAGNOSIS — Z6841 Body Mass Index (BMI) 40.0 and over, adult: Secondary | ICD-10-CM

## 2019-03-17 DIAGNOSIS — Z992 Dependence on renal dialysis: Secondary | ICD-10-CM

## 2019-03-17 MED ORDER — HYDROCODONE-ACETAMINOPHEN 5-325 MG PO TABS: ORAL_TABLET | ORAL | 0 refills | Status: DC

## 2019-03-17 NOTE — Progress Notes (Signed)
Subjective:    Patient ID: Lori [name], female    DOB: [birth year], 48 y.o.   MRN: [id number]  HPI She fell and hurt her right knee 03-07-2019.  She went to the ER and was evaluated.  They were concerned about a medial femoral condyle fracture.  She had a CT in the ER and it showed no fracture: IMPRESSION: 1. No evidence of acute fracture or dislocation. 2. Tricompartmental degenerative changes, most advanced in the lateral compartment. 3. Moderate to large knee joint effusion.  She was given a knee immobilizer and pain medicine. She has less tenderness but it still hurts and swells.She could not tolerated the knee immobilizer.  She has a long history of bilateral knee pain.  She has seen Dr. Franz Dell in [place] and Dr. Gladstone Lighter for this over the years.  She has been advised to have a total knee.  She is on dialysis going three times a week. She has been on this for 23 years.  Every time she gets ready for a total knee procedure she has kidney problem and surgery is delayed.      Review of Systems  Constitutional: Positive for activity change.  Genitourinary: Positive for difficulty urinating.  Musculoskeletal: Positive for arthralgias, gait problem, joint swelling and myalgias.  All other systems reviewed and are negative.  For Review of Systems, all other systems reviewed and are negative.  The following is a summary of the past history medically, past history surgically, known current medicines, social history and family history.  This information is gathered electronically by the computer from prior information and documentation.  I review this each visit and have found including this information at this point in the chart is beneficial and informative.   Past Medical History:  Diagnosis Date  . Anemia   . ASCVD (arteriosclerotic cardiovascular disease)   . Calciphylaxis   . Chronic abdominal wound infection   . Dialysis patient ([place])   . ESRD (end stage renal disease)  ([place])    Due to membranous GN dialysis 09/1996; peritoneal dialysis --? peitonitis; difficult vascular access  . GERD (gastroesophageal reflux disease)   . Hashimoto thyroiditis   . Hyperparathyroidism   . Hypertension   . Hypothyroidism   . Medically noncompliant   . Morbid obesity ([place])     Past Surgical History:  Procedure Laterality Date  . [place]  . LEFT HEART CATH AND CORONARY ANGIOGRAPHY N/A 02/11/2018   Procedure: LEFT HEART CATH AND CORONARY ANGIOGRAPHY;  Surgeon: Charolette Forward, MD;  Location: [facility];  Service: Cardiovascular;  Laterality: N/A;  . THYROIDECTOMY, PARTIAL     Resectin of left lobe with reimplantation in the forearm, small focus of papillary carcinoma incidentally noted at pathology - 2000 and Hashimoto's thyrdoiditis in 2001  . TONSILLECTOMY AND ADENOIDECTOMY      Current Outpatient Medications on File Prior to Visit  Medication Sig Dispense Refill  . albuterol (PROVENTIL HFA;VENTOLIN HFA) 108 (90 BASE) MCG/ACT inhaler Inhale 2 puffs into the lungs every 6 (six) hours as needed. Shortness of breath    . aspirin EC 81 MG EC tablet Take 1 tablet (81 mg total) by mouth daily. 30 tablet 3  . atorvastatin (LIPITOR) 80 MG tablet Take 1 tablet (80 mg total) by mouth daily at 6 PM. 30 tablet 0  . b complex-vitamin c-folic acid (NEPHRO-VITE) 0.8 MG TABS tablet Take 1 tablet by mouth daily.    . clindamycin (CLEOCIN) 300 MG capsule Take 1 capsule (  300 mg total) by mouth 3 (three) times daily. 42 capsule 1  . EPINEPHrine 0.3 mg/0.3 mL IJ SOAJ injection Inject 0.3 mg into the muscle as needed.    Marland Kitchen levofloxacin (LEVAQUIN) 500 MG tablet Take 1 tablet (500 mg total) by mouth daily. 14 tablet 1  . levothyroxine (SYNTHROID, LEVOTHROID) 300 MCG tablet Take 300 mcg by mouth daily before breakfast. SYNTHROID ONLY-BRAND NAME MEDICALLY NECESSARY    . losartan (COZAAR) 25 MG tablet Take 0.5 tablets (12.5 mg total) by mouth daily. 30 tablet 3  . nitroGLYCERIN  (NITROSTAT) 0.4 MG SL tablet Place 1 tablet (0.4 mg total) under the tongue every 5 (five) minutes x 3 doses as needed for chest pain. 25 tablet 12  . oxyCODONE-acetaminophen (PERCOCET/ROXICET) 5-325 MG tablet Take 1-2 tablets by mouth every 6 (six) hours as needed for severe pain. 15 tablet 0  . sevelamer carbonate (RENVELA) 800 MG tablet Take 2,400-3,200 mg by mouth 3 (three) times daily with meals. 3200mg  with meals and 2400mg  with snacks    . ticagrelor (BRILINTA) 90 MG TABS tablet Take 1 tablet (90 mg total) by mouth 2 (two) times daily. 60 tablet 3   No current facility-administered medications on file prior to visit.     Social History   Socioeconomic History  . Marital status: Married    Spouse name: Not on file  . Number of children: Not on file  . Years of education: Not on file  . Highest education level: Not on file  Occupational History  . Not on file  Social Needs  . Financial resource strain: Not on file  . Food insecurity:    Worry: Not on file    Inability: Not on file  . Transportation needs:    Medical: Not on file    Non-medical: Not on file  Tobacco Use  . Smoking status: Former Research scientist (life sciences)  . Smokeless tobacco: Never Used  Substance and Sexual Activity  . Alcohol use: No  . Drug use: No  . Sexual activity: Yes    Birth control/protection: Surgical  Lifestyle  . Physical activity:    Days per week: Not on file    Minutes per session: Not on file  . Stress: Not on file  Relationships  . Social connections:    Talks on phone: Not on file    Gets together: Not on file    Attends religious service: Not on file    Active member of club or organization: Not on file    Attends meetings of clubs or organizations: Not on file    Relationship status: Not on file  . Intimate partner violence:    Fear of current or ex partner: Not on file    Emotionally abused: Not on file    Physically abused: Not on file    Forced sexual activity: Not on file  Other Topics  Concern  . Not on file  Social History Narrative   Married   No regular exercise    Family History  Problem Relation Age of Onset  . Coronary artery disease Mother   . Kidney disease Father   . Diabetes Sister     Ht 5\' 3"  (1.6 m)   Wt 265 lb 6.9 oz (120.4 kg)   BMI 47.02 kg/m   Body mass index is 47.02 kg/m.     Objective:   Physical Exam Vitals signs reviewed.  Constitutional:      Appearance: She is well-developed.  HENT:  Head: Normocephalic and atraumatic.  Eyes:     Conjunctiva/sclera: Conjunctivae normal.     Pupils: Pupils are equal, round, and reactive to light.  Neck:     Musculoskeletal: Normal range of motion and neck supple.  Cardiovascular:     Rate and Rhythm: Normal rate and regular rhythm.  Pulmonary:     Effort: Pulmonary effort is normal.  Abdominal:     Palpations: Abdomen is soft.  Musculoskeletal:     Right knee: She exhibits decreased range of motion, swelling, effusion and deformity. Tenderness found. Lateral joint line tenderness noted.       Legs:  Skin:    General: Skin is warm and dry.  Neurological:     Mental Status: She is alert and oriented to person, place, and time.     Cranial Nerves: No cranial nerve deficit.     Motor: No abnormal muscle tone.     Coordination: Coordination normal.     Deep Tendon Reflexes: Reflexes are normal and symmetric. Reflexes normal.  Psychiatric:        Behavior: Behavior normal.        Thought Content: Thought content normal.        Judgment: Judgment normal.      I have reviewed the ER record, x-rays and CT and reports of 03-07-2019.     Assessment & Plan:   Encounter Diagnoses  Name Primary?  . Chronic pain of right knee Yes  . ESRD on dialysis ([place])   . Body mass index 45.0-49.9, adult ([place])   . Morbid obesity ([place])    PROCEDURE NOTE:  The patient requests injections of the right knee , verbal consent was obtained.  The right knee was prepped appropriately after time out  was performed.   Sterile technique was observed and injection of 1 cc of Depo-Medrol 40 mg with several cc's of plain xylocaine. Anesthesia was provided by ethyl chloride and a 20-gauge needle was used to inject the knee area. The injection was tolerated well.  A band aid dressing was applied.  The patient was advised to apply ice later today and tomorrow to the injection sight as needed.  She is a candidate for a total knee.  I will do a virtual visit in two weeks.  Call if any problem.  Precautions discussed.  I have reviewed the [place] [facility] web site prior to prescribing narcotic medicine for this patient.      Electronically Signed Sanjuana Kava, MD 4/21/202010:05 AM

## 2019-03-31 ENCOUNTER — Other Ambulatory Visit: Payer: Self-pay

## 2019-03-31 ENCOUNTER — Ambulatory Visit (INDEPENDENT_AMBULATORY_CARE_PROVIDER_SITE_OTHER): Payer: Medicare Other | Admitting: Orthopaedic Surgery

## 2019-03-31 ENCOUNTER — Encounter: Payer: Self-pay | Admitting: Orthopaedic Surgery

## 2019-03-31 DIAGNOSIS — N186 End stage renal disease: Secondary | ICD-10-CM | POA: Diagnosis not present

## 2019-03-31 DIAGNOSIS — M25561 Pain in right knee: Secondary | ICD-10-CM

## 2019-03-31 DIAGNOSIS — Z992 Dependence on renal dialysis: Secondary | ICD-10-CM

## 2019-03-31 DIAGNOSIS — Z6841 Body Mass Index (BMI) 40.0 and over, adult: Secondary | ICD-10-CM

## 2019-03-31 DIAGNOSIS — G8929 Other chronic pain: Secondary | ICD-10-CM

## 2019-03-31 MED ORDER — HYDROCODONE-ACETAMINOPHEN 5-325 MG PO TABS: ORAL_TABLET | ORAL | 0 refills | Status: DC

## 2019-03-31 NOTE — Progress Notes (Signed)
Virtual Visit via Telephone Note  I connected with Lori [name] on 03/31/19 at  9:50 AM EDT by telephone and verified that I am speaking with the correct person using two identifiers.     I discussed the limitations, risks, security and privacy concerns of performing an evaluation and management service by telephone and the availability of in person appointments. I also discussed with the patient that there may be a patient responsible charge related to this service. The patient expressed understanding and agreed to proceed.   History of Present Illness: She has pain in the right knee and has swelling and giving way still.  She has to use a brace and a cane to get around.  The injection only helped a little.  She has no redness.  She goes to dialysis three times a week.  I feel she needs MRI.  These are not being done now secondary to COVID-19.  I will call in refill of pain medicine.   Observations/Objective: Per above  Assessment and Plan: Encounter Diagnoses  Name Primary?  . Chronic pain of right knee Yes  . ESRD on dialysis ([place])   . Body mass index 45.0-49.9, adult ([place])   . Morbid obesity ([place])    I have reviewed the [place] web site prior to prescribing narcotic medicine for this patient.   Follow Up Instructions: I will do virtual visit in one month.  Schedule MRI when it is up an running again.  I am concerned about meniscus tear of the right knee medially.   I discussed the assessment and treatment plan with the patient. The patient was provided an opportunity to ask questions and all were answered. The patient agreed with the plan and demonstrated an understanding of the instructions.   The patient was advised to call back or seek an in-person evaluation if the symptoms worsen or if the condition fails to improve as anticipated.  I provided 8 minutes of non-face-to-face time during this encounter.   Sanjuana Kava, MD

## 2019-04-29 ENCOUNTER — Other Ambulatory Visit: Payer: Self-pay

## 2019-04-29 ENCOUNTER — Ambulatory Visit: Payer: Medicare Other | Admitting: Orthopaedic Surgery

## 2019-04-30 ENCOUNTER — Telehealth: Payer: Self-pay | Admitting: Orthopaedic Surgery

## 2019-04-30 MED ORDER — HYDROCODONE-ACETAMINOPHEN 5-325 MG PO TABS: ORAL_TABLET | ORAL | 0 refills | Status: DC

## 2019-04-30 NOTE — Telephone Encounter (Signed)
Hydrocodone-Acetaminophen 5/325 mg  Qty 28 Tablets  PATIENT USES [facility] 

## 2019-05-05 ENCOUNTER — Other Ambulatory Visit: Payer: Self-pay

## 2019-05-05 ENCOUNTER — Ambulatory Visit: Payer: Medicare Other | Admitting: Orthopaedic Surgery

## 2019-05-06 ENCOUNTER — Encounter: Payer: Self-pay | Admitting: Orthopaedic Surgery

## 2019-05-06 ENCOUNTER — Ambulatory Visit (INDEPENDENT_AMBULATORY_CARE_PROVIDER_SITE_OTHER): Payer: Medicare Other | Admitting: Orthopaedic Surgery

## 2019-05-06 DIAGNOSIS — N186 End stage renal disease: Secondary | ICD-10-CM

## 2019-05-06 DIAGNOSIS — M25561 Pain in right knee: Secondary | ICD-10-CM | POA: Diagnosis not present

## 2019-05-06 DIAGNOSIS — G8929 Other chronic pain: Secondary | ICD-10-CM | POA: Diagnosis not present

## 2019-05-06 DIAGNOSIS — Z992 Dependence on renal dialysis: Secondary | ICD-10-CM

## 2019-05-06 DIAGNOSIS — Z6841 Body Mass Index (BMI) 40.0 and over, adult: Secondary | ICD-10-CM

## 2019-05-06 MED ORDER — HYDROCODONE-ACETAMINOPHEN 5-325 MG PO TABS: ORAL_TABLET | ORAL | 0 refills | Status: DC

## 2019-05-06 NOTE — Progress Notes (Signed)
Virtual Visit via Telephone Note  I connected with@ on 05/06/19 at 10:40 AM EDT by telephone and verified that I am speaking with the correct person using two identifiers.  Location: Patient: home Provider: office   I discussed the limitations, risks, security and privacy concerns of performing an evaluation and management service by telephone and the availability of in person appointments. I also discussed with the patient that there may be a patient responsible charge related to this service. The patient expressed understanding and agreed to proceed.   History of Present Illness: She has more pain in the right knee and giving way.  She fell as it gave way.  She has swelling and popping.  She has no redness, no other trauma. She is on dialysis.  I will get a MRI of the knee as I am concerned about a torn meniscus.   Observations/Objective: Per above.  Assessment and Plan: Encounter Diagnoses  Name Primary?  . Chronic pain of right knee Yes  . ESRD on dialysis ([place])   . Body mass index 45.0-49.9, adult ([place])   . Morbid obesity ([place])      Follow Up Instructions: Get MRI of the right knee.  I will refill her pain medicine.  I have reviewed the [place] web site prior to prescribing narcotic medicine for this patient.      I discussed the assessment and treatment plan with the patient. The patient was provided an opportunity to ask questions and all were answered. The patient agreed with the plan and demonstrated an understanding of the instructions.   The patient was advised to call back or seek an in-person evaluation if the symptoms worsen or if the condition fails to improve as anticipated.  I provided 12 minutes of non-face-to-face time during this encounter.   Sanjuana Kava, MD

## 2019-05-25 ENCOUNTER — Telehealth: Payer: Self-pay

## 2019-05-25 NOTE — Telephone Encounter (Signed)
Patient called stating the nurse was needing some more information before her MRI could be scheduled. She stated that last week she had gotten a call from our office, but I don't see any notes.   Please call and advise [phone]

## 2019-05-25 NOTE — Telephone Encounter (Signed)
I attempted to call the pt on 6/10 and 6/19 to get her MRI scheduled. Left VM today asking for pt to verify area needing MRI, see if she has any metal, pacemakers, stents etc in the body. Ensure she is not claustrophobic,COVID questions are negative, and see if there is a time preference for imaging. Pt does not need authorization for scan.

## 2019-07-08 ENCOUNTER — Telehealth: Payer: Self-pay

## 2019-07-08 DIAGNOSIS — G8929 Other chronic pain: Secondary | ICD-10-CM

## 2019-07-08 DIAGNOSIS — M25561 Pain in right knee: Secondary | ICD-10-CM

## 2019-07-08 NOTE — Telephone Encounter (Signed)
Lori [name] has put in order patient will get a call from [facility] to schedule, [name] not have open unit, she had CT scan previously not MRI

## 2019-07-08 NOTE — Telephone Encounter (Signed)
Patient called to give information so her MRI can be scheduled. Lori [name] left her a message back on 05/25/19 needing the following: Patient is claustrophobic, has 2 stents in heart, 1 in right arm and thinks they told her there was one in groin area. She wants early morning appointment for MRI on Tuesday or Thursday because she has dialysis the other days. I told her that we would probably send her to [facility] since they have a open unit, she stated that [name] has one.  Please call and advise  [phone]

## 2019-07-23 ENCOUNTER — Ambulatory Visit
Admission: RE | Admit: 2019-07-23 | Discharge: 2019-07-23 | Disposition: A | Discharge status: Home / Self Care | Payer: Medicare Other | Source: Ambulatory Visit | Attending: Orthopaedic Surgery | Admitting: Orthopaedic Surgery

## 2019-07-23 ENCOUNTER — Other Ambulatory Visit: Payer: Self-pay

## 2019-07-23 DIAGNOSIS — G8929 Other chronic pain: Secondary | ICD-10-CM

## 2019-08-04 ENCOUNTER — Encounter: Payer: Self-pay | Admitting: Orthopaedic Surgery

## 2019-08-04 ENCOUNTER — Other Ambulatory Visit: Payer: Self-pay

## 2019-08-04 ENCOUNTER — Ambulatory Visit (INDEPENDENT_AMBULATORY_CARE_PROVIDER_SITE_OTHER): Payer: Medicare Other | Admitting: Orthopaedic Surgery

## 2019-08-04 VITALS — BP 109/52 | HR 62 | Ht 64.0 in | Wt 228.0 lb

## 2019-08-04 DIAGNOSIS — M25561 Pain in right knee: Secondary | ICD-10-CM

## 2019-08-04 DIAGNOSIS — G8929 Other chronic pain: Secondary | ICD-10-CM

## 2019-08-04 DIAGNOSIS — Z992 Dependence on renal dialysis: Secondary | ICD-10-CM

## 2019-08-04 DIAGNOSIS — N186 End stage renal disease: Secondary | ICD-10-CM | POA: Diagnosis not present

## 2019-08-04 MED ORDER — HYDROCODONE-ACETAMINOPHEN 5-325 MG PO TABS: ORAL_TABLET | ORAL | 0 refills | Status: DC

## 2019-08-04 NOTE — Addendum Note (Signed)
Addended by: Willette Pa on: 08/04/2019 11:56 AM   Modules accepted: Orders

## 2019-08-04 NOTE — Progress Notes (Signed)
Patient [id number] Lori [name], female DOB:Aug 25, 1971, 48 y.o. [id number]  Chief Complaint  Patient presents with  . Knee Pain    right     HPI  Lori [name] is a 48 y.o. female who has continued pain with the right knee.  She is on dialysis.  She had MRI which showed: IMPRESSION: 1. Severe maceration of the body of lateral meniscus extending into the anterior and posterior horns. 2. Tricompartmental cartilage abnormalities of the right knee most severe in the lateral femorotibial compartment.  Janeal Holmes explained the findings to her.  I will have her see Dr. Aline Brochure for possible surgery.    Body mass index is 39.14 kg/m.  ROS  Review of Systems  Constitutional: Positive for activity change.  Genitourinary: Positive for difficulty urinating.  Musculoskeletal: Positive for arthralgias, gait problem, joint swelling and myalgias.  All other systems reviewed and are negative.   All other systems reviewed and are negative.  The following is a summary of the past history medically, past history surgically, known current medicines, social history and family history.  This information is gathered electronically by the computer from prior information and documentation.  I review this each visit and have found including this information at this point in the chart is beneficial and informative.    Past Medical History:  Diagnosis Date  . Anemia   . ASCVD (arteriosclerotic cardiovascular disease)   . Calciphylaxis   . Chronic abdominal wound infection   . Dialysis patient ([place])   . 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 peitonitis; difficult vascular access  . GERD (gastroesophageal reflux disease)   . Hashimoto thyroiditis   . Hyperparathyroidism   . Hypertension   . Hypothyroidism   . Medically noncompliant   . Morbid obesity ([place])     Past Surgical History:  Procedure Laterality Date  . [place]  . LEFT HEART  CATH AND CORONARY ANGIOGRAPHY N/A 02/11/2018   Procedure: LEFT HEART CATH AND CORONARY ANGIOGRAPHY;  Surgeon: Charolette Forward, MD;  Location: [facility];  Service: Cardiovascular;  Laterality: N/A;  . THYROIDECTOMY, PARTIAL     Resectin of left lobe with reimplantation in the forearm, small focus of papillary carcinoma incidentally noted at pathology - 2000 and Hashimoto's thyrdoiditis in 2001  . TONSILLECTOMY AND ADENOIDECTOMY      Family History  Problem Relation Age of Onset  . Coronary artery disease Mother   . Kidney disease Father   . Diabetes Sister     Social History Social History   Tobacco Use  . Smoking status: Former Research scientist (life sciences)  . Smokeless tobacco: Never Used  Substance Use Topics  . Alcohol use: No  . Drug use: No    Allergies  Allergen Reactions  . Activase [Alteplase] Shortness Of Breath  . Bee Pollen Anaphylaxis  . Warfarin Sodium Nausea And Vomiting and Rash    Current Outpatient Medications  Medication Sig Dispense Refill  . albuterol (PROVENTIL HFA;VENTOLIN HFA) 108 (90 BASE) MCG/ACT inhaler Inhale 2 puffs into the lungs every 6 (six) hours as needed. Shortness of breath    . aspirin EC 81 MG EC tablet Take 1 tablet (81 mg total) by mouth daily. 30 tablet 3  . atorvastatin (LIPITOR) 80 MG tablet Take 1 tablet (80 mg total) by mouth daily at 6 PM. 30 tablet 0  . b complex-vitamin c-folic acid (NEPHRO-VITE) 0.8 MG TABS tablet Take 1 tablet by mouth  daily.    Marland Kitchen EPINEPHrine 0.3 mg/0.3 mL IJ SOAJ injection Inject 0.3 mg into the muscle as needed.    Marland Kitchen HYDROcodone-acetaminophen (NORCO/VICODIN) 5-325 MG tablet One tablet every six hours for pain.  Limit 7 days. 28 tablet 0  . levothyroxine (SYNTHROID, LEVOTHROID) 300 MCG tablet Take 300 mcg by mouth daily before breakfast. SYNTHROID ONLY-BRAND NAME MEDICALLY NECESSARY    . losartan (COZAAR) 25 MG tablet Take 0.5 tablets (12.5 mg total) by mouth daily. 30 tablet 3  . nitroGLYCERIN (NITROSTAT) 0.4 MG SL tablet  Place 1 tablet (0.4 mg total) under the tongue every 5 (five) minutes x 3 doses as needed for chest pain. 25 tablet 12  . sevelamer carbonate (RENVELA) 800 MG tablet Take 2,400-3,200 mg by mouth 3 (three) times daily with meals. 3200mg  with meals and 2400mg  with snacks    . ticagrelor (BRILINTA) 90 MG TABS tablet Take 1 tablet (90 mg total) by mouth 2 (two) times daily. 60 tablet 3  . carvedilol (COREG) 3.125 MG tablet     . oxyCODONE-acetaminophen (PERCOCET/ROXICET) 5-325 MG tablet Take 1-2 tablets by mouth every 6 (six) hours as needed for severe pain. (Patient not taking: Reported on 08/04/2019) 15 tablet 0   No current facility-administered medications for this visit.      Physical Exam  Blood pressure (!) 109/52, pulse 62, height 5\' 4"  (1.626 m), weight 228 lb (103.4 kg).  Constitutional: overall normal hygiene, normal nutrition, well developed, normal grooming, normal body habitus. Assistive device:none  Musculoskeletal: gait and station Limp right, muscle tone and strength are normal, no tremors or atrophy is present.  .  Neurological: coordination overall normal.  Deep tendon reflex/nerve stretch intact.  Sensation normal.  Cranial nerves II-XII intact.   Skin:   Normal overall no scars, lesions, ulcers or rashes. No psoriasis.  Psychiatric: Alert and oriented x 3.  Recent memory intact, remote memory unclear.  Normal mood and affect. Well groomed.  Good eye contact.  Cardiovascular: overall no swelling, no varicosities, no edema bilaterally, normal temperatures of the legs and arms, no clubbing, cyanosis and good capillary refill.  Lymphatic: palpation is normal.  Right knee with effusion, crepitus, ROM 0 to 105, NV intact,  Limp right.  All other systems reviewed and are negative   The patient has been educated about the nature of the problem(s) and counseled on treatment options.  The patient appeared to understand what I have discussed and is in agreement with  it.  Encounter Diagnoses  Name Primary?  . Chronic pain of right knee Yes  . ESRD on dialysis [facility])     PLAN Call if any problems.  Precautions discussed.  Continue current medications.   Return to clinic to see Dr. Aline Brochure.   Electronically Signed Sanjuana Kava, MD 9/8/202011:50 AM

## 2019-08-12 DIAGNOSIS — T886XXA Anaphylactic reaction due to adverse effect of correct drug or medicament properly administered, initial encounter: Secondary | ICD-10-CM | POA: Insufficient documentation

## 2019-08-28 ENCOUNTER — Ambulatory Visit: Payer: Medicare Other | Admitting: Orthopedic Surgery

## 2019-09-01 ENCOUNTER — Ambulatory Visit: Payer: Medicare Other | Admitting: Orthopedic Surgery

## 2019-09-08 ENCOUNTER — Ambulatory Visit (INDEPENDENT_AMBULATORY_CARE_PROVIDER_SITE_OTHER): Payer: Medicare Other | Admitting: Orthopedic Surgery

## 2019-09-08 ENCOUNTER — Telehealth: Payer: Self-pay | Admitting: Orthopaedic Surgery

## 2019-09-08 ENCOUNTER — Other Ambulatory Visit: Payer: Self-pay

## 2019-09-08 VITALS — Temp 98.2°F | Ht 64.0 in | Wt 228.0 lb

## 2019-09-08 DIAGNOSIS — M1712 Unilateral primary osteoarthritis, left knee: Secondary | ICD-10-CM

## 2019-09-08 DIAGNOSIS — M1710 Unilateral primary osteoarthritis, unspecified knee: Secondary | ICD-10-CM

## 2019-09-08 NOTE — Progress Notes (Signed)
Chief Complaint  Patient presents with  . Knee Pain    Right knee, referred by Dr. Luna Glasgow.     48 year old female presents with severe right knee pain that she has had since fall back in April I believe sent to me by Dr. Chrissie Noa for possible surgery she has associated pain swelling decreased range of motion she had an MRI which shows she has meniscal tears and arthritis of her knee  She also has had a coronary artery angiography with 2 stents 1 successful 1 not patient currently on Brilinta nitroglycerin she is also a dialysis patient followed by Dr. Terrence Dupont and Radene Gunning    Primary care doctor: [facility] for [facility]  Past Medical History:  Diagnosis Date  . Anemia   . ASCVD (arteriosclerotic cardiovascular disease)   . Calciphylaxis   . Chronic abdominal wound infection   . Dialysis patient ([place])   . 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 peitonitis; difficult vascular access  . GERD (gastroesophageal reflux disease)   . Hashimoto thyroiditis   . Hyperparathyroidism   . Hypertension   . Hypothyroidism   . Medically noncompliant   . Morbid obesity ([place])    Past Surgical History:  Procedure Laterality Date  . [place]  . LEFT HEART CATH AND CORONARY ANGIOGRAPHY N/A 02/11/2018   Procedure: LEFT HEART CATH AND CORONARY ANGIOGRAPHY;  Surgeon: Charolette Forward, MD;  Location: [facility];  Service: Cardiovascular;  Laterality: N/A;  . THYROIDECTOMY, PARTIAL     Resectin of left lobe with reimplantation in the forearm, small focus of papillary carcinoma incidentally noted at pathology - 2000 and Hashimoto's thyrdoiditis in 2001  . TONSILLECTOMY AND ADENOIDECTOMY     Family History  Problem Relation Age of Onset  . Coronary artery disease Mother   . Kidney disease Father   . Diabetes Sister    Temp 98.2 F (36.8 C)   Ht 5\' 4"  (1.626 m)   Wt 228 lb (103.4 kg)   BMI 39.14 kg/m   Appearance  moderately obese oriented x3 mood pleasant affect normal gait supported by walker with a limp favoring the right leg  The right knee has a 30 degree flexion contracture bends about 95 degrees tender in the lateral medial joint lines with large joint effusion feels stable to drawer testing strength and muscle tone are normal skin is intact with some minor discoloration thought to be from her renal condition  Distally extremities are warm to touch she has normal sensation in the right leg  Her x-rays and MRI show arthritis of the knee I personally read the x-rays and MRI she also has a macerated lateral meniscus  MRI report read into the chart  IMPRESSION: 1. Severe maceration of the body of lateral meniscus extending into the anterior and posterior horns. 2. Tricompartmental cartilage abnormalities of the right knee most severe in the lateral femorotibial compartment.     Electronically Signed   By: Kathreen Devoid   On: 07/23/2019 10:36    Assessment and plan severe arthritis right knee maceration lateral meniscus with lateral joint pain  The patient is status post recent coronary angiography with unsuccessful stenting currently on Brilinta not amenable to surgery at this time  I aspirated her knee got back 60 cc of clear yellow fluid I injected the knee  Her follow-up will be Dr. Luna Glasgow until surgery can be performed  Procedure note injection and aspiration  left knee joint  Verbal consent was obtained to aspirate and inject the left knee joint   Timeout was completed to confirm the site of aspiration and injection  An 18-gauge needle was used to aspirate the left knee joint from a suprapatellar lateral approach.  The medications used were 40 mg of Depo-Medrol and 1% lidocaine 3 cc  Anesthesia was provided by ethyl chloride and the skin was prepped with alcohol.  After cleaning the skin with alcohol an 18-gauge needle was used to aspirate the right knee joint.  We obtained  60 cc of fluid clear yellow   We followed this by injection of 40 mg of Depo-Medrol and 3 cc 1% lidocaine.  There were no complications. A sterile bandage was applied.

## 2019-09-08 NOTE — Patient Instructions (Signed)

## 2019-09-08 NOTE — Telephone Encounter (Signed)
Patient asked at check out following her consult with Dr Aline Brochure today, 09/08/19 (aware Dr Aline Brochure advises return to see Dr Luna Glasgow in 2 months)  if she may have a refill on medication: HYDROcodone-acetaminophen (NORCO/VICODIN) 5-325 MG tablet -[employer]

## 2019-09-09 MED ORDER — HYDROCODONE-ACETAMINOPHEN 5-325 MG PO TABS: ORAL_TABLET | ORAL | 0 refills | Status: DC

## 2019-10-07 ENCOUNTER — Telehealth: Payer: Self-pay | Admitting: Orthopaedic Surgery

## 2019-10-07 NOTE — Telephone Encounter (Signed)
Patient requests refill on Hydrocodone/Acetaminophen 5-325  Mgs.  Qty  28 °  °Sig: One tablet every six hours for pain.  Limit 7 days. °  °Patient states she uses [facility] °

## 2019-10-08 MED ORDER — HYDROCODONE-ACETAMINOPHEN 5-325 MG PO TABS: ORAL_TABLET | ORAL | 0 refills | Status: DC

## 2019-10-12 ENCOUNTER — Emergency Department (HOSPITAL_COMMUNITY): Payer: Medicare Other

## 2019-10-12 ENCOUNTER — Encounter (HOSPITAL_COMMUNITY): Payer: Self-pay | Admitting: *Deleted

## 2019-10-12 ENCOUNTER — Other Ambulatory Visit: Payer: Self-pay

## 2019-10-12 ENCOUNTER — Inpatient Hospital Stay (HOSPITAL_COMMUNITY)
Admission: EM | Admit: 2019-10-12 | Discharge: 2019-10-22 | DRG: 246 | Disposition: A | Discharge status: Home / Self Care | Payer: Medicare Other | Attending: Internal Medicine | Admitting: Internal Medicine

## 2019-10-12 DIAGNOSIS — N2581 Secondary hyperparathyroidism of renal origin: Secondary | ICD-10-CM | POA: Diagnosis present

## 2019-10-12 DIAGNOSIS — G8929 Other chronic pain: Secondary | ICD-10-CM | POA: Diagnosis present

## 2019-10-12 DIAGNOSIS — E063 Autoimmune thyroiditis: Secondary | ICD-10-CM | POA: Diagnosis present

## 2019-10-12 DIAGNOSIS — I214 Non-ST elevation (NSTEMI) myocardial infarction: Principal | ICD-10-CM | POA: Diagnosis present

## 2019-10-12 DIAGNOSIS — E8889 Other specified metabolic disorders: Secondary | ICD-10-CM | POA: Diagnosis present

## 2019-10-12 DIAGNOSIS — Z8249 Family history of ischemic heart disease and other diseases of the circulatory system: Secondary | ICD-10-CM

## 2019-10-12 DIAGNOSIS — Z87891 Personal history of nicotine dependence: Secondary | ICD-10-CM

## 2019-10-12 DIAGNOSIS — Z955 Presence of coronary angioplasty implant and graft: Secondary | ICD-10-CM

## 2019-10-12 DIAGNOSIS — I2510 Atherosclerotic heart disease of native coronary artery without angina pectoris: Secondary | ICD-10-CM | POA: Diagnosis present

## 2019-10-12 DIAGNOSIS — I249 Acute ischemic heart disease, unspecified: Secondary | ICD-10-CM

## 2019-10-12 DIAGNOSIS — E039 Hypothyroidism, unspecified: Secondary | ICD-10-CM | POA: Diagnosis present

## 2019-10-12 DIAGNOSIS — I872 Venous insufficiency (chronic) (peripheral): Secondary | ICD-10-CM | POA: Diagnosis present

## 2019-10-12 DIAGNOSIS — I739 Peripheral vascular disease, unspecified: Secondary | ICD-10-CM | POA: Diagnosis present

## 2019-10-12 DIAGNOSIS — N186 End stage renal disease: Secondary | ICD-10-CM | POA: Diagnosis present

## 2019-10-12 DIAGNOSIS — I10 Essential (primary) hypertension: Secondary | ICD-10-CM | POA: Diagnosis present

## 2019-10-12 DIAGNOSIS — Z841 Family history of disorders of kidney and ureter: Secondary | ICD-10-CM

## 2019-10-12 DIAGNOSIS — Z87441 Personal history of nephrotic syndrome: Secondary | ICD-10-CM

## 2019-10-12 DIAGNOSIS — R079 Chest pain, unspecified: Secondary | ICD-10-CM | POA: Diagnosis present

## 2019-10-12 DIAGNOSIS — M1990 Unspecified osteoarthritis, unspecified site: Secondary | ICD-10-CM | POA: Diagnosis present

## 2019-10-12 DIAGNOSIS — D631 Anemia in chronic kidney disease: Secondary | ICD-10-CM | POA: Diagnosis present

## 2019-10-12 DIAGNOSIS — I252 Old myocardial infarction: Secondary | ICD-10-CM

## 2019-10-12 DIAGNOSIS — I255 Ischemic cardiomyopathy: Secondary | ICD-10-CM | POA: Diagnosis present

## 2019-10-12 DIAGNOSIS — I5022 Chronic systolic (congestive) heart failure: Secondary | ICD-10-CM | POA: Diagnosis present

## 2019-10-12 DIAGNOSIS — Z79899 Other long term (current) drug therapy: Secondary | ICD-10-CM

## 2019-10-12 DIAGNOSIS — Z992 Dependence on renal dialysis: Secondary | ICD-10-CM

## 2019-10-12 DIAGNOSIS — I132 Hypertensive heart and chronic kidney disease with heart failure and with stage 5 chronic kidney disease, or end stage renal disease: Secondary | ICD-10-CM | POA: Diagnosis present

## 2019-10-12 DIAGNOSIS — Z6841 Body Mass Index (BMI) 40.0 and over, adult: Secondary | ICD-10-CM

## 2019-10-12 DIAGNOSIS — I4891 Unspecified atrial fibrillation: Secondary | ICD-10-CM | POA: Diagnosis present

## 2019-10-12 DIAGNOSIS — R252 Cramp and spasm: Secondary | ICD-10-CM | POA: Diagnosis present

## 2019-10-12 DIAGNOSIS — Z91030 Bee allergy status: Secondary | ICD-10-CM

## 2019-10-12 DIAGNOSIS — Z79890 Hormone replacement therapy (postmenopausal): Secondary | ICD-10-CM

## 2019-10-12 DIAGNOSIS — I890 Lymphedema, not elsewhere classified: Secondary | ICD-10-CM

## 2019-10-12 DIAGNOSIS — E785 Hyperlipidemia, unspecified: Secondary | ICD-10-CM | POA: Diagnosis present

## 2019-10-12 DIAGNOSIS — Z9119 Patient's noncompliance with other medical treatment and regimen: Secondary | ICD-10-CM

## 2019-10-12 DIAGNOSIS — Z20828 Contact with and (suspected) exposure to other viral communicable diseases: Secondary | ICD-10-CM | POA: Diagnosis present

## 2019-10-12 DIAGNOSIS — Z888 Allergy status to other drugs, medicaments and biological substances status: Secondary | ICD-10-CM

## 2019-10-12 DIAGNOSIS — Z833 Family history of diabetes mellitus: Secondary | ICD-10-CM

## 2019-10-12 DIAGNOSIS — K219 Gastro-esophageal reflux disease without esophagitis: Secondary | ICD-10-CM | POA: Diagnosis present

## 2019-10-12 LAB — CBC
HCT: 36.9 % (ref 36.0–46.0)
Hemoglobin: 11.6 g/dL — ABNORMAL LOW (ref 12.0–15.0)
MCH: 30.9 pg (ref 26.0–34.0)
MCHC: 31.4 g/dL (ref 30.0–36.0)
MCV: 98.4 fL (ref 80.0–100.0)
Platelets: 227 10*3/uL (ref 150–400)
RBC: 3.75 MIL/uL — ABNORMAL LOW (ref 3.87–5.11)
RDW: 14.6 % (ref 11.5–15.5)
WBC: 6.4 10*3/uL (ref 4.0–10.5)
nRBC: 0 % (ref 0.0–0.2)

## 2019-10-12 LAB — BASIC METABOLIC PANEL
Anion gap: 13 (ref 5–15)
BUN: 23 mg/dL — ABNORMAL HIGH (ref 6–20)
CO2: 28 mmol/L (ref 22–32)
Calcium: 8.1 mg/dL — ABNORMAL LOW (ref 8.9–10.3)
Chloride: 93 mmol/L — ABNORMAL LOW (ref 98–111)
Creatinine, Ser: 5.95 mg/dL — ABNORMAL HIGH (ref 0.44–1.00)
GFR calc Af Amer: 9 mL/min — ABNORMAL LOW (ref >60)
GFR calc non Af Amer: 8 mL/min — ABNORMAL LOW (ref >60)
Glucose, Bld: 109 mg/dL — ABNORMAL HIGH (ref 70–99)
Potassium: 3.7 mmol/L (ref 3.5–5.1)
Sodium: 134 mmol/L — ABNORMAL LOW (ref 135–145)

## 2019-10-12 LAB — TROPONIN I (HIGH SENSITIVITY)
Troponin I (High Sensitivity): 44 ng/L — ABNORMAL HIGH (ref <18)
Troponin I (High Sensitivity): 331 ng/L (ref <18)

## 2019-10-12 MED ORDER — HEPARIN (PORCINE) 25000 UT/250ML-% IV SOLN
1350.0000 [IU]/h | INTRAVENOUS | Status: DC
  Administered 2019-10-13: 01:00:00 1000 [IU]/h via INTRAVENOUS
  Filled 2019-10-12 (×2): qty 250

## 2019-10-12 MED ORDER — HEPARIN BOLUS VIA INFUSION
4000.0000 [IU] | Freq: Once | INTRAVENOUS | Status: AC
  Administered 2019-10-13: 01:00:00 4000 [IU] via INTRAVENOUS

## 2019-10-12 MED ORDER — SODIUM CHLORIDE 0.9% FLUSH: 3.0000 mL | Freq: Once | INTRAVENOUS | Status: DC

## 2019-10-12 NOTE — ED Notes (Signed)
Cardiology paged via carelink to [facility] [phone] 

## 2019-10-12 NOTE — ED Notes (Addendum)
Date and time results received: 10/12/19 10:08 PM  (use smartphrase ".now" to insert current time)   Test: troponin  Critical Value: 331  Name of Provider Notified: Margarita Mail, PA  Orders Received? Or Actions Taken?: na

## 2019-10-12 NOTE — ED Triage Notes (Signed)
Pt c/o mid sternal chest pain that started on the way home from dialysis around 5:30 pm today; pt states she took one nitro with some relief; pt c/o increased sob

## 2019-10-12 NOTE — ED Notes (Signed)
Attempted IV, pt stated it hurt too much and to take it out.

## 2019-10-12 NOTE — ED Provider Notes (Signed)
[facility] EMERGENCY DEPARTMENT Provider Note   CSN: [id number] Arrival date & time: 10/12/19  1849     History   Chief Complaint Chief Complaint  Patient presents with  . Chest Pain    HPI Lori [name] is a 48 y.o. female. 48 year old female who presents the emergency department with chief complaint of chest pain.  Patient has a past medical history of coronary artery disease, end-stage renal disease on hemodialysis Monday Wednesday Friday with last dialysis today, morbid obesity, hypothyroidism, hyperparathyroidism, Hashimoto's disease, medical noncompliance,  Calciphylaxis.  About 5 PM just after finishing dialysis the patient had onset of a cold sensation across her chest, chest pressure and feeling like burning in her chest behind her sternum.  Lori [name] became diaphoretic.  Pain did not radiate.  Lori [name] felt short of breath.  Lori [name] took 2 sublingual nitroglycerin over 10 minutes with complete relief of her symptoms.  Lori [name] came to the emergency department and currently has no chest pain.  Lori [name] denies racing or skipping in her heart, unilateral leg swelling or tenderness, fevers or chills.  Review of EMR shows patient's last heart catheterization by Dr. Terrence Dupont in 2019 with placement of drug-eluting stent, multiple the blood vessels with high-level atherosclerotic disease untreated due to caliber of the vessel. HPI: A 48 year old patient with a history of peripheral artery disease, hypertension and obesity presents for evaluation of chest pain. Initial onset of pain was approximately 1-3 hours ago. The patient's chest pain is described as heaviness/pressure/tightness and is not worse with exertion. The patient reports some diaphoresis. The patient's chest pain is middle- or left-sided, is not well-localized, is not sharp and does not radiate to the arms/jaw/neck. The patient does not complain of nausea. The patient has a family history of coronary artery disease in a first-degree relative with onset less  than age 22. The patient has no history of stroke, has not smoked in the past 90 days, denies any history of treated diabetes and has no history of hypercholesterolemia.   HPI  Past Medical History:  Diagnosis Date  . Anemia   . ASCVD (arteriosclerotic cardiovascular disease)   . Calciphylaxis   . Chronic abdominal wound infection   . Dialysis patient ([place])   . ESRD (end stage renal disease) ([place])    Due to membranous GN dialysis 09/1996; peritoneal dialysis --? peitonitis; difficult vascular access  . GERD (gastroesophageal reflux disease)   . Hashimoto thyroiditis   . Hyperparathyroidism   . Hypertension   . Hypothyroidism   . Medically noncompliant   . Morbid obesity [facility])     Patient Active Problem List   Diagnosis Date Noted  . Superficial injury of right index finger with infection 12/25/2018  . Lymphedema 04/21/2018  . Chronic venous insufficiency 04/21/2018  . Non-STEMI (non-ST elevated myocardial infarction) ([place]) 02/09/2018  . Hyperparathyroidism ([place]) 06/12/2016  . HTN (hypertension) 06/12/2016  . Nonrheumatic aortic valve stenosis 06/04/2016  . Glomerulonephritis 05/24/2016  . History of asthma 05/24/2016  . History of TIA (transient ischemic attack) 05/24/2016  . Vocal cord dysfunction 05/24/2016  . BMI 40.0-44.9, adult ([place]) 05/24/2016  . Hypocalcemia 03/27/2014  . ESRD on dialysis ([place]) 03/27/2014  . Hypothyroidism 03/27/2014  . Chronic abdominal wound infection 03/27/2014  . Calciphylaxis 03/27/2014  . End stage renal disease ([place]) 03/27/2014  . ATHEROSCLEROTIC CARDIOVASCULAR DISEASE 09/22/2010  . Atherosclerotic heart disease of native coronary artery without angina pectoris 09/22/2010  . SYNCOPE 07/16/2010  . POSTURAL LIGHTHEADEDNESS 07/16/2010  . OBESITY 07/14/2010  . GASTROESOPHAGEAL  REFLUX DISEASE 07/14/2010  . PALPITATIONS 07/14/2010  . HYPERPARATHYROIDISM, HX OF 07/14/2010  . Gastro-esophageal reflux disease without esophagitis 07/14/2010  .  Personal history of other endocrine, nutritional and metabolic disease [id number]    Past Surgical History:  Procedure Laterality Date  . [place]  . LEFT HEART CATH AND CORONARY ANGIOGRAPHY N/A 02/11/2018   Procedure: LEFT HEART CATH AND CORONARY ANGIOGRAPHY;  Surgeon: Charolette Forward, MD;  Location: [place] [facility] LAB;  Service: Cardiovascular;  Laterality: N/A;  . THYROIDECTOMY, PARTIAL     Resectin of left lobe with reimplantation in the forearm, small focus of papillary carcinoma incidentally noted at pathology - 2000 and Hashimoto's thyrdoiditis in 2001  . TONSILLECTOMY AND ADENOIDECTOMY       OB History    Gravida  1   Para  1   Term      Preterm  1   AB      Living        SAB      TAB      Ectopic      Multiple      Live Births               Home Medications    Prior to Admission medications   Medication Sig Start Date End Date Taking? Authorizing Provider  albuterol (PROVENTIL HFA;VENTOLIN HFA) 108 (90 BASE) MCG/ACT inhaler Inhale 2 puffs into the lungs every 6 (six) hours as needed. Shortness of breath    [provider]  aspirin EC 81 MG EC tablet Take 1 tablet (81 mg total) by mouth daily. 02/15/18   Charolette Forward, MD  atorvastatin (LIPITOR) 80 MG tablet Take 1 tablet (80 mg total) by mouth daily at 6 PM. 02/14/18   Charolette Forward, MD  b complex-vitamin c-folic acid (NEPHRO-VITE) 0.8 MG TABS tablet Take 1 tablet by mouth daily.    [provider]  carvedilol (COREG) 3.125 MG tablet  07/28/19   [provider]  EPINEPHrine 0.3 mg/0.3 mL IJ SOAJ injection Inject 0.3 mg into the muscle as needed.    [provider]  HYDROcodone-acetaminophen (NORCO/VICODIN) 5-325 MG tablet One tablet every six hours for pain.  Limit 7 days. 10/08/19   Sanjuana Kava, MD  levothyroxine (SYNTHROID, LEVOTHROID) 300 MCG tablet Take 300 mcg by mouth daily before breakfast. SYNTHROID ONLY-BRAND NAME MEDICALLY NECESSARY    [provider]  losartan (COZAAR) 25 MG tablet Take 0.5 tablets (12.5 mg total) by mouth daily. 02/15/18   Charolette Forward, MD  nitroGLYCERIN (NITROSTAT) 0.4 MG SL tablet Place 1 tablet (0.4 mg total) under the tongue every 5 (five) minutes x 3 doses as needed for chest pain. 02/14/18   Charolette Forward, MD  sevelamer carbonate (RENVELA) 800 MG tablet Take 2,400-3,200 mg by mouth 3 (three) times daily with meals. 3200mg  with meals and 2400mg  with snacks    [provider]  ticagrelor (BRILINTA) 90 MG TABS tablet Take 1 tablet (90 mg total) by mouth 2 (two) times daily. 02/14/18   Charolette Forward, MD    Family History Family History  Problem Relation Age of Onset  . Coronary artery disease Mother   . Kidney disease Father   . Diabetes Sister     Social History Social History   Tobacco Use  . Smoking status: Former Research scientist (life sciences)  . Smokeless tobacco: Never Used  Substance Use Topics  . Alcohol use: No  . Drug use: No     Allergies   Activase [  alteplase], Bee pollen, and Warfarin sodium   Review of Systems Review of Systems  Ten systems reviewed and are negative for acute change, except as noted in the HPI.   Physical Exam Updated Vital Signs BP 109/79 (BP Location: Left Arm)   Pulse (!) 34   Temp 97.6 F (36.4 C) (Oral)   Resp 15   Ht 5\' 3"  (1.6 m)   Wt 103 kg   SpO2 100%   BMI 40.22 kg/m   Physical Exam Vitals signs and nursing note reviewed.  Constitutional:      General: Lori [name] is not in acute distress.    Appearance: Lori [name] is obese. Lori [name] is not diaphoretic.     Comments: Patient appears much older than stated age  HENT:     Head: Normocephalic and atraumatic.  Eyes:     General: No scleral icterus.    Conjunctiva/sclera: Conjunctivae normal.  Neck:     Musculoskeletal: Normal range of motion.     Vascular: No JVD.  Cardiovascular:     Rate and Rhythm: Normal rate and regular rhythm.     Heart sounds: Heart sounds are distant. No murmur. No friction rub. No gallop.    Pulmonary:     Effort: Pulmonary effort is normal. Tachypnea present. No respiratory distress.     Breath sounds: Normal breath sounds. No wheezing.     Comments: Tachycardia and regular rhythm Abdominal:     General: Bowel sounds are normal. There is no distension.     Palpations: Abdomen is soft. There is no mass.     Tenderness: There is no abdominal tenderness. There is no guarding.  Skin:    General: Skin is warm and dry.  Neurological:     Mental Status: Lori [name] is alert and oriented to person, place, and time.  Psychiatric:        Behavior: Behavior normal.      ED Treatments / Results  Labs (all labs ordered are listed, but only abnormal results are displayed) Labs Reviewed  BASIC METABOLIC PANEL - Abnormal; Notable for the following components:      Result Value   Sodium 134 (*)    Chloride 93 (*)    Glucose, Bld 109 (*)    BUN 23 (*)    Creatinine, Ser 5.95 (*)    Calcium 8.1 (*)    GFR calc non Af Amer 8 (*)    GFR calc Af Amer 9 (*)    All other components within normal limits  CBC - Abnormal; Notable for the following components:   RBC 3.75 (*)    Hemoglobin 11.6 (*)    All other components within normal limits  TROPONIN I (HIGH SENSITIVITY) - Abnormal; Notable for the following components:   Troponin I (High Sensitivity) 44 (*)    All other components within normal limits  TROPONIN I (HIGH SENSITIVITY) - Abnormal; Notable for the following components:   Troponin I (High Sensitivity) 331 (*)    All other components within normal limits  POC URINE PREG, ED    EKG EKG Interpretation  Date/Time:  Monday October 12 2019 19:03:56 EST Ventricular Rate:  123 PR Interval:    QRS Duration: 106 QT Interval:  366 QTC Calculation: 524 R Axis:   -32 Text Interpretation: Atrial fibrillation Left axis deviation Repol abnrm suggests ischemia, anterolateral Prolonged QT interval Baseline wander in lead(s) I III aVL Confirmed by Milton Ferguson [phone]) on 10/12/2019  7:38:26 PM   Radiology Dg Chest [facility] 1 [address]  Result Date: 10/12/2019 CLINICAL DATA:  Chest pain EXAM: PORTABLE CHEST 1 VIEW COMPARISON:  02/02/2019 FINDINGS: Right-sided central venous catheter tip over the right atrium. Clips at the thoracic inlet. Cardiomegaly without acute airspace disease, pleural effusion or edema. No pneumothorax. Right axillary vascular stents. IMPRESSION: No active disease.  Cardiomegaly. Electronically Signed   By: Donavan Foil M.D.   On: 10/12/2019 20:31    Procedures Procedures (including critical care time)  Medications Ordered in ED Medications  sodium chloride flush (NS) 0.9 % injection 3 mL (0 mLs Intravenous Hold 10/12/19 1915)     Initial Impression / Assessment and Plan / ED Course  I have reviewed the triage vital signs and the nursing notes.  Pertinent labs & imaging results that were available during my care of the patient were reviewed by me and considered in my medical decision making (see chart for details).  Clinical Course as of Oct 11 2218  Mon Oct 12, 2019  2109 Troponin I (High Sensitivity)(!) [AH]    Clinical Course User Index [AH] Margarita Mail, [place]    HEAR Score: 3  48 year old female with end-stage renal disease, history of coronary atherosclerosis, previous heart catheterization showed multivessel disease.  Lori [name] has 1 stent placed but medical management for the others.  The patient has no active chest pain at this time however had significant chest pressure and burning sensation which was relieved after 2 nitroglycerin.  Lori [name] has a heart score of 5.  Her troponin level is increasing first 44 and now 331.  Her EKG shows sinus tachycardia.  Lori [name] has no evidence of atrial fibrillation and I disagree with the automatic interpretation of the EKG. patient's BMP shows that her creatinine is at baseline.  Lori [name] has no elevated potassium level.  Her white blood cell count is within normal limits.  Lori [name] has normocytic anemia.  I personally  reviewed the patient's chest x-ray which shows a right-sided Vas-Cath without other abnormality including edema.  The patient will need admission for chest pain.  I have given signout to Dr. Roderic Palau who admit the patient.  Final Clinical Impressions(s) / ED Diagnoses   Final diagnoses:  Chest pain    ED Discharge Orders    None       Margarita Mail, PA-C 10/12/19 2219    Milton Ferguson, MD 10/12/19 2228

## 2019-10-13 ENCOUNTER — Inpatient Hospital Stay (HOSPITAL_COMMUNITY): Payer: Medicare Other

## 2019-10-13 ENCOUNTER — Encounter (HOSPITAL_COMMUNITY): Payer: Self-pay | Admitting: Student

## 2019-10-13 DIAGNOSIS — Z992 Dependence on renal dialysis: Secondary | ICD-10-CM | POA: Diagnosis not present

## 2019-10-13 DIAGNOSIS — D631 Anemia in chronic kidney disease: Secondary | ICD-10-CM | POA: Diagnosis present

## 2019-10-13 DIAGNOSIS — R079 Chest pain, unspecified: Secondary | ICD-10-CM | POA: Diagnosis present

## 2019-10-13 DIAGNOSIS — R252 Cramp and spasm: Secondary | ICD-10-CM | POA: Diagnosis present

## 2019-10-13 DIAGNOSIS — I255 Ischemic cardiomyopathy: Secondary | ICD-10-CM

## 2019-10-13 DIAGNOSIS — I739 Peripheral vascular disease, unspecified: Secondary | ICD-10-CM | POA: Diagnosis present

## 2019-10-13 DIAGNOSIS — I4891 Unspecified atrial fibrillation: Secondary | ICD-10-CM | POA: Diagnosis present

## 2019-10-13 DIAGNOSIS — I2510 Atherosclerotic heart disease of native coronary artery without angina pectoris: Secondary | ICD-10-CM | POA: Diagnosis present

## 2019-10-13 DIAGNOSIS — G8929 Other chronic pain: Secondary | ICD-10-CM | POA: Diagnosis present

## 2019-10-13 DIAGNOSIS — E063 Autoimmune thyroiditis: Secondary | ICD-10-CM | POA: Diagnosis present

## 2019-10-13 DIAGNOSIS — I249 Acute ischemic heart disease, unspecified: Secondary | ICD-10-CM | POA: Diagnosis present

## 2019-10-13 DIAGNOSIS — I252 Old myocardial infarction: Secondary | ICD-10-CM | POA: Diagnosis not present

## 2019-10-13 DIAGNOSIS — I214 Non-ST elevation (NSTEMI) myocardial infarction: Secondary | ICD-10-CM | POA: Diagnosis present

## 2019-10-13 DIAGNOSIS — K219 Gastro-esophageal reflux disease without esophagitis: Secondary | ICD-10-CM

## 2019-10-13 DIAGNOSIS — I25110 Atherosclerotic heart disease of native coronary artery with unstable angina pectoris: Secondary | ICD-10-CM | POA: Diagnosis not present

## 2019-10-13 DIAGNOSIS — N186 End stage renal disease: Secondary | ICD-10-CM | POA: Diagnosis present

## 2019-10-13 DIAGNOSIS — I25118 Atherosclerotic heart disease of native coronary artery with other forms of angina pectoris: Secondary | ICD-10-CM | POA: Diagnosis not present

## 2019-10-13 DIAGNOSIS — E785 Hyperlipidemia, unspecified: Secondary | ICD-10-CM

## 2019-10-13 DIAGNOSIS — Z9119 Patient's noncompliance with other medical treatment and regimen: Secondary | ICD-10-CM | POA: Diagnosis not present

## 2019-10-13 DIAGNOSIS — Z6841 Body Mass Index (BMI) 40.0 and over, adult: Secondary | ICD-10-CM | POA: Diagnosis not present

## 2019-10-13 DIAGNOSIS — N2581 Secondary hyperparathyroidism of renal origin: Secondary | ICD-10-CM | POA: Diagnosis present

## 2019-10-13 DIAGNOSIS — E8889 Other specified metabolic disorders: Secondary | ICD-10-CM | POA: Diagnosis present

## 2019-10-13 DIAGNOSIS — Z955 Presence of coronary angioplasty implant and graft: Secondary | ICD-10-CM | POA: Diagnosis not present

## 2019-10-13 DIAGNOSIS — Z20828 Contact with and (suspected) exposure to other viral communicable diseases: Secondary | ICD-10-CM | POA: Diagnosis present

## 2019-10-13 DIAGNOSIS — I5022 Chronic systolic (congestive) heart failure: Secondary | ICD-10-CM | POA: Diagnosis present

## 2019-10-13 DIAGNOSIS — I132 Hypertensive heart and chronic kidney disease with heart failure and with stage 5 chronic kidney disease, or end stage renal disease: Secondary | ICD-10-CM | POA: Diagnosis present

## 2019-10-13 DIAGNOSIS — I872 Venous insufficiency (chronic) (peripheral): Secondary | ICD-10-CM

## 2019-10-13 LAB — CBC
HCT: 37.7 % (ref 36.0–46.0)
Hemoglobin: 11.8 g/dL — ABNORMAL LOW (ref 12.0–15.0)
MCH: 30.7 pg (ref 26.0–34.0)
MCHC: 31.3 g/dL (ref 30.0–36.0)
MCV: 98.2 fL (ref 80.0–100.0)
Platelets: 237 10*3/uL (ref 150–400)
RBC: 3.84 MIL/uL — ABNORMAL LOW (ref 3.87–5.11)
RDW: 14.5 % (ref 11.5–15.5)
WBC: 7.1 10*3/uL (ref 4.0–10.5)
nRBC: 0 % (ref 0.0–0.2)

## 2019-10-13 LAB — COMPREHENSIVE METABOLIC PANEL
ALT: 16 U/L (ref 0–44)
AST: 24 U/L (ref 15–41)
Albumin: 4 g/dL (ref 3.5–5.0)
Alkaline Phosphatase: 64 U/L (ref 38–126)
Anion gap: 13 (ref 5–15)
BUN: 25 mg/dL — ABNORMAL HIGH (ref 6–20)
CO2: 27 mmol/L (ref 22–32)
Calcium: 8.3 mg/dL — ABNORMAL LOW (ref 8.9–10.3)
Chloride: 97 mmol/L — ABNORMAL LOW (ref 98–111)
Creatinine, Ser: 6.73 mg/dL — ABNORMAL HIGH (ref 0.44–1.00)
GFR calc Af Amer: 8 mL/min — ABNORMAL LOW (ref >60)
GFR calc non Af Amer: 7 mL/min — ABNORMAL LOW (ref >60)
Glucose, Bld: 104 mg/dL — ABNORMAL HIGH (ref 70–99)
Potassium: 4.3 mmol/L (ref 3.5–5.1)
Sodium: 137 mmol/L (ref 135–145)
Total Bilirubin: 0.5 mg/dL (ref 0.3–1.2)
Total Protein: 7.3 g/dL (ref 6.5–8.1)

## 2019-10-13 LAB — ECHOCARDIOGRAM COMPLETE
Height: 63 in
Weight: 3612.02 oz

## 2019-10-13 LAB — HEPARIN LEVEL (UNFRACTIONATED)
Heparin Unfractionated: 0.32 IU/mL (ref 0.30–0.70)
Heparin Unfractionated: 0.11 IU/mL — ABNORMAL LOW (ref 0.30–0.70)

## 2019-10-13 LAB — TROPONIN I (HIGH SENSITIVITY)
Troponin I (High Sensitivity): 1528 ng/L (ref <18)
Troponin I (High Sensitivity): 1541 ng/L (ref <18)

## 2019-10-13 LAB — SARS CORONAVIRUS 2 (TAT 6-24 HRS): SARS Coronavirus 2: NEGATIVE

## 2019-10-13 LAB — MRSA PCR SCREENING: MRSA by PCR: NEGATIVE

## 2019-10-13 MED ORDER — ATORVASTATIN CALCIUM 80 MG PO TABS
80.0000 mg | ORAL_TABLET | Freq: Every morning | ORAL | Status: DC
  Administered 2019-10-13 – 2019-10-22 (×9): 80 mg via ORAL
  Filled 2019-10-13 (×9): qty 1
  Filled 2019-10-13: qty 2

## 2019-10-13 MED ORDER — SEVELAMER CARBONATE 800 MG PO TABS
1600.0000 mg | ORAL_TABLET | Freq: Two times a day (BID) | ORAL | Status: DC | PRN
  Filled 2019-10-13: qty 2

## 2019-10-13 MED ORDER — TICAGRELOR 90 MG PO TABS
90.0000 mg | ORAL_TABLET | Freq: Two times a day (BID) | ORAL | Status: DC
  Administered 2019-10-13 – 2019-10-14 (×4): 90 mg via ORAL
  Filled 2019-10-13 (×4): qty 1

## 2019-10-13 MED ORDER — ONDANSETRON HCL 4 MG PO TABS: 4.0000 mg | ORAL_TABLET | Freq: Four times a day (QID) | ORAL | Status: DC | PRN

## 2019-10-13 MED ORDER — HYDROCODONE-ACETAMINOPHEN 5-325 MG PO TABS
1.0000 | ORAL_TABLET | Freq: Four times a day (QID) | ORAL | Status: DC | PRN
  Administered 2019-10-13 – 2019-10-21 (×14): 1 via ORAL
  Filled 2019-10-13 (×17): qty 1

## 2019-10-13 MED ORDER — NITROGLYCERIN 0.4 MG SL SUBL: 0.4000 mg | SUBLINGUAL_TABLET | SUBLINGUAL | Status: DC | PRN

## 2019-10-13 MED ORDER — SODIUM CHLORIDE 0.9% FLUSH
3.0000 mL | Freq: Two times a day (BID) | INTRAVENOUS | Status: DC
  Administered 2019-10-13 – 2019-10-19 (×8): 3 mL via INTRAVENOUS

## 2019-10-13 MED ORDER — CARVEDILOL 6.25 MG PO TABS: 6.2500 mg | ORAL_TABLET | Freq: Two times a day (BID) | ORAL | Status: DC

## 2019-10-13 MED ORDER — CARVEDILOL 3.125 MG PO TABS: 3.1250 mg | ORAL_TABLET | Freq: Two times a day (BID) | ORAL | Status: DC

## 2019-10-13 MED ORDER — CHLORHEXIDINE GLUCONATE CLOTH 2 % EX PADS
6.0000 | MEDICATED_PAD | Freq: Every day | CUTANEOUS | Status: DC
  Administered 2019-10-13 – 2019-10-20 (×5): 6 via TOPICAL

## 2019-10-13 MED ORDER — SEVELAMER CARBONATE 800 MG PO TABS
3200.0000 mg | ORAL_TABLET | Freq: Three times a day (TID) | ORAL | Status: DC
  Administered 2019-10-15 – 2019-10-22 (×18): 3200 mg via ORAL
  Filled 2019-10-13 (×18): qty 4

## 2019-10-13 MED ORDER — METOPROLOL TARTRATE 25 MG PO TABS
25.0000 mg | ORAL_TABLET | Freq: Two times a day (BID) | ORAL | Status: DC
  Administered 2019-10-13 – 2019-10-17 (×7): 25 mg via ORAL
  Filled 2019-10-13 (×7): qty 1

## 2019-10-13 MED ORDER — SODIUM CHLORIDE 0.9 % IV SOLN: 250.0000 mL | INTRAVENOUS | Status: DC | PRN

## 2019-10-13 MED ORDER — ASPIRIN EC 81 MG PO TBEC
81.0000 mg | DELAYED_RELEASE_TABLET | Freq: Every day | ORAL | Status: DC
  Administered 2019-10-13 – 2019-10-22 (×10): 81 mg via ORAL
  Filled 2019-10-13 (×10): qty 1

## 2019-10-13 MED ORDER — EPINEPHRINE PF 1 MG/ML IJ SOLN
INTRAMUSCULAR | Status: AC
  Administered 2019-10-13: 1 mg
  Filled 2019-10-13: qty 5

## 2019-10-13 MED ORDER — SODIUM CHLORIDE 0.9% FLUSH: 3.0000 mL | INTRAVENOUS | Status: DC | PRN

## 2019-10-13 MED ORDER — LOSARTAN POTASSIUM 25 MG PO TABS: 12.5000 mg | ORAL_TABLET | Freq: Every day | ORAL | Status: DC

## 2019-10-13 MED ORDER — HEPARIN BOLUS VIA INFUSION
2000.0000 [IU] | Freq: Once | INTRAVENOUS | Status: AC
  Administered 2019-10-13: 18:00:00 2000 [IU] via INTRAVENOUS
  Filled 2019-10-13: qty 2000

## 2019-10-13 MED ORDER — ONDANSETRON HCL 4 MG/2ML IJ SOLN
4.0000 mg | Freq: Four times a day (QID) | INTRAMUSCULAR | Status: DC | PRN
  Administered 2019-10-13: 4 mg via INTRAVENOUS
  Filled 2019-10-13: qty 2

## 2019-10-13 MED ORDER — LEVOTHYROXINE SODIUM 100 MCG PO TABS
300.0000 ug | ORAL_TABLET | Freq: Every day | ORAL | Status: DC
  Administered 2019-10-13 – 2019-10-14 (×2): 300 ug via ORAL
  Filled 2019-10-13 (×3): qty 3

## 2019-10-13 NOTE — Consult Note (Signed)
Reason for Consult:NSTEMI Referring Physician  [facility]  Lori [name] is an 48 y.o. female.  Lori [name] is 48 year old female with past medical history significant for multivessel coronary artery disease, history of non-Q-wave myocardial infarction in March 2019.  Status post attempted PCI to distal RCA and PDA complicated by catheter-induced spiral dissection to ostial RCA, extending to mid RCA requiring.  3.  Drug-eluting stents ,history of PCI to proximal LAD and remote past , noted to have multivessel CAD, opted for medical management in the past, hypertension, ischemic cardiomyopathy, history of congestive heart failure secondary to depressed LV systolic function, hyperlipidemia, hypothyroidism, history of nephrotic syndrome/end-stage renal disease on hemodialysis, morbid obesity, degenerative joint disease, was admitted last night at  [facility]  because of retrosternal chest pain described as pressure and cold feeling in the chest after hemodialysis , grade 8/10 lasting more than 2 hours, took 2 sublingual nitroglycerin without much relief, so decided to go to ED.  Patient was noted to be in A. Fib with rapid ventricular response and was noted to have elevated high sensitivity troponin I.  Patient denies such episodes of chest pain and last more than one year.  Denies any shortness of breath.  Denies palpitations, lightheadedness or syncope.  Denies nausea, vomiting, diaphoresis.patient states her chest pain has resolved since last night.  Past Medical History:  Diagnosis Date  . Anemia   . ASCVD (arteriosclerotic cardiovascular disease)    a. s/p prior stenting of LAD b. NSTEMI in 01/2018 requiring DES x3 to RCA given spiral dissection from ostium to mid-RCA but residual disease along distal RCA, LAD and 2nd Mrg  . Calciphylaxis   . Chronic abdominal wound infection   . Dialysis patient ([place])   . ESRD (end stage renal disease) ([place])    Due to membranous GN dialysis  09/1996; peritoneal dialysis --? peitonitis; difficult vascular access  . GERD (gastroesophageal reflux disease)   . Hashimoto thyroiditis   . Hyperparathyroidism   . Hypertension   . Hypothyroidism   . Medically noncompliant   . Morbid obesity ([place])     Past Surgical History:  Procedure Laterality Date  . [place]  . LEFT HEART CATH AND CORONARY ANGIOGRAPHY N/A 02/11/2018   Procedure: LEFT HEART CATH AND CORONARY ANGIOGRAPHY;  Surgeon: Charolette Forward, MD;  Location: [facility];  Service: Cardiovascular;  Laterality: N/A;  . THYROIDECTOMY, PARTIAL     Resectin of left lobe with reimplantation in the forearm, small focus of papillary carcinoma incidentally noted at pathology - 2000 and Hashimoto's thyrdoiditis in 2001  . TONSILLECTOMY AND ADENOIDECTOMY      Family History  Problem Relation Age of Onset  . Coronary artery disease Mother   . Kidney disease Father   . Diabetes Sister     Social History:  reports that she has quit smoking. She has never used smokeless tobacco. She reports that she does not drink alcohol or use drugs.  Allergies:  Allergies  Allergen Reactions  . Activase [Alteplase] Shortness Of Breath  . Bee Pollen Anaphylaxis  . Warfarin Sodium Nausea And Vomiting and Rash    Medications: I have reviewed the patient's current medications.  Results for orders placed or performed during the hospital encounter of 10/12/19 (from the past 48 hour(s))  Basic metabolic panel     Status: Abnormal   Collection Time: 10/12/19  7:25 PM  Result Value Ref Range   Sodium 134 (L) 135 - 145 mmol/L  Potassium 3.7 3.5 - 5.1 mmol/L   Chloride 93 (L) 98 - 111 mmol/L   CO2 28 22 - 32 mmol/L   Glucose, Bld 109 (H) 70 - 99 mg/dL   BUN 23 (H) 6 - 20 mg/dL   Creatinine, Ser 5.95 (H) 0.44 - 1.00 mg/dL   Calcium 8.1 (L) 8.9 - 10.3 mg/dL   GFR calc non Af Amer 8 (L) >60 mL/min   GFR calc Af Amer 9 (L) >60 mL/min   Anion gap 13 5 - 15    Comment: Performed at [facility], [address], [place], [place] [address]  CBC     Status: Abnormal   Collection Time: 10/12/19  7:25 PM  Result Value Ref Range   WBC 6.4 4.0 - 10.5 K/uL   RBC 3.75 (L) 3.87 - 5.11 MIL/uL   Hemoglobin 11.6 (L) 12.0 - 15.0 g/dL   HCT 36.9 36.0 - 46.0 %   MCV 98.4 80.0 - 100.0 fL   MCH 30.9 26.0 - 34.0 pg   MCHC 31.4 30.0 - 36.0 g/dL   RDW 14.6 11.5 - 15.5 %   Platelets 227 150 - 400 K/uL   nRBC 0.0 0.0 - 0.2 %    Comment: Performed at [facility], [address], [place], [place] [address]  Troponin I (High Sensitivity)     Status: Abnormal   Collection Time: 10/12/19  7:25 PM  Result Value Ref Range   Troponin I (High Sensitivity) 44 (H) <18 ng/L    Comment: (NOTE) Elevated high sensitivity troponin I (hsTnI) values and significant  changes across serial measurements may suggest ACS but many other  chronic and acute conditions are known to elevate hsTnI results.  Refer to the "Links" section for chest pain algorithms and additional  guidance. Performed at [facility], [address] [place], [place] [address]   Troponin I (High Sensitivity)     Status: Abnormal   Collection Time: 10/12/19  9:19 PM  Result Value Ref Range   Troponin I (High Sensitivity) 331 (HH) <18 ng/L    Comment: CRITICAL RESULT CALLED TO, READ BACK BY AND VERIFIED WITH: ELLIS,C AT 2208 ON 11.16.20 BY ISLEY,B (NOTE) Elevated high sensitivity troponin I (hsTnI) values and significant  changes across serial measurements may suggest ACS but many other  chronic and acute conditions are known to elevate hsTnI results.  Refer to the Links section for chest pain algorithms and additional  guidance. Performed at [facility], [address] [place], [place] [address]   MRSA PCR Screening     Status: None   Collection Time: 10/13/19  1:30 AM   Specimen: Nasal Mucosa; Nasopharyngeal  Result Value Ref Range   MRSA by PCR NEGATIVE NEGATIVE    Comment:        The GeneXpert MRSA Assay (FDA approved for  NASAL specimens only), is one component of a comprehensive MRSA colonization surveillance program. It is not intended to diagnose MRSA infection nor to guide or monitor treatment for MRSA infections. Performed at [facility], [address], [place], [place] [address]   CBC     Status: Abnormal   Collection Time: 10/13/19  1:54 AM  Result Value Ref Range   WBC 7.1 4.0 - 10.5 K/uL   RBC 3.84 (L) 3.87 - 5.11 MIL/uL   Hemoglobin 11.8 (L) 12.0 - 15.0 g/dL   HCT 37.7 36.0 - 46.0 %   MCV 98.2 80.0 - 100.0 fL   MCH 30.7 26.0 - 34.0  pg   MCHC 31.3 30.0 - 36.0 g/dL   RDW 14.5 11.5 - 15.5 %   Platelets 237 150 - 400 K/uL   nRBC 0.0 0.0 - 0.2 %    Comment: Performed at [facility], [address] [place], [place] [address]  Comprehensive metabolic panel     Status: Abnormal   Collection Time: 10/13/19  1:54 AM  Result Value Ref Range   Sodium 137 135 - 145 mmol/L   Potassium 4.3 3.5 - 5.1 mmol/L   Chloride 97 (L) 98 - 111 mmol/L   CO2 27 22 - 32 mmol/L   Glucose, Bld 104 (H) 70 - 99 mg/dL   BUN 25 (H) 6 - 20 mg/dL   Creatinine, Ser 6.73 (H) 0.44 - 1.00 mg/dL   Calcium 8.3 (L) 8.9 - 10.3 mg/dL   Total Protein 7.3 6.5 - 8.1 g/dL   Albumin 4.0 3.5 - 5.0 g/dL   AST 24 15 - 41 U/L   ALT 16 0 - 44 U/L   Alkaline Phosphatase 64 38 - 126 U/L   Total Bilirubin 0.5 0.3 - 1.2 mg/dL   GFR calc non Af Amer 7 (L) >60 mL/min   GFR calc Af Amer 8 (L) >60 mL/min   Anion gap 13 5 - 15    Comment: Performed at [facility], [address] [place], [place] [address]  Troponin I (High Sensitivity)     Status: Abnormal   Collection Time: 10/13/19  1:54 AM  Result Value Ref Range   Troponin I (High Sensitivity) 1,528 (HH) <18 ng/L    Comment: CRITICAL RESULT CALLED TO, READ BACK BY AND VERIFIED WITH: S WADE,RN @0253  10/13/19 MKELLY (NOTE) Elevated high sensitivity troponin I (hsTnI) values and significant  changes across serial measurements may suggest ACS but many other  chronic and acute  conditions are known to elevate hsTnI results.  Refer to the Links section for chest pain algorithms and additional  guidance. Performed at [facility], [address], [place], [place] [address]   Troponin I (High Sensitivity)     Status: Abnormal   Collection Time: 10/13/19  4:08 AM  Result Value Ref Range   Troponin I (High Sensitivity) 1,541 (HH) <18 ng/L    Comment: CRITICAL RESULT CALLED TO, READ BACK BY AND VERIFIED WITH: WADE,S AT 5:30AM ON 10/13/19 BY FESTERMAN,C (NOTE) Elevated high sensitivity troponin I (hsTnI) values and significant  changes across serial measurements may suggest ACS but many other  chronic and acute conditions are known to elevate hsTnI results.  Refer to the Links section for chest pain algorithms and additional  guidance. Performed at [facility], [address] [place], [place] [address]   Heparin level (unfractionated)     Status: None   Collection Time: 10/13/19  7:40 AM  Result Value Ref Range   Heparin Unfractionated 0.32 0.30 - 0.70 IU/mL    Comment: (NOTE) If heparin results are below expected values, and patient dosage has  been confirmed, suggest follow up testing of antithrombin III levels. Performed at [facility], [address], [place], [place] [address]     Dg Chest Port 1 View  Result Date: 10/12/2019 CLINICAL DATA:  Chest pain EXAM: PORTABLE CHEST 1 VIEW COMPARISON:  02/02/2019 FINDINGS: Right-sided central venous catheter tip over the right atrium. Clips at the thoracic inlet. Cardiomegaly without acute airspace disease, pleural effusion or edema. No pneumothorax. Right axillary vascular stents. IMPRESSION: No active disease.  Cardiomegaly. Electronically Signed   By: Maudie Mercury  Francoise Ceo M.D.   On: 10/12/2019 20:31    Review of Systems  Constitutional: Negative for chills.  HENT: Negative for hearing loss.   Eyes: Negative for blurred vision.  Respiratory: Negative for cough and shortness of breath.   Cardiovascular: Positive for  chest pain. Negative for orthopnea, claudication and leg swelling.  Gastrointestinal: Negative for abdominal pain, nausea and vomiting.  Musculoskeletal: Positive for joint pain.  Neurological: Negative for dizziness.   Blood pressure 105/66, pulse 62, temperature 98.3 F (36.8 C), temperature source Oral, resp. rate 20, height 5\' 3"  (1.6 m), weight 102.4 kg, SpO2 99 %. Physical Exam  Constitutional: She is oriented to person, place, and time.  HENT:  Head: Normocephalic and atraumatic.  Eyes: Pupils are equal, round, and reactive to light. Conjunctivae are normal. No scleral icterus.  Neck: Normal range of motion. Neck supple. No JVD present. No tracheal deviation present. No thyromegaly present.  Cardiovascular:  Irregularly irregular, tachycardic, S1, S2, soft  Respiratory: Effort normal and breath sounds normal. No respiratory distress. She has no wheezes. She has no rales.  GI: Soft. Bowel sounds are normal. She exhibits distension. There is no abdominal tenderness.  Musculoskeletal:        General: No tenderness or edema.  Neurological: She is alert and oriented to person, place, and time.    Assessment/Plan: Acute non-Q-wave myocardial infarction. Multivessel CAD, status post PCI to LAD and RCA in the past, with significant residual stenosis in the left system. Ischemic cardiomyopathy. New onset A. Fib with RVR. Compensated systolic congestive heart failure. Hypertension Hyperlipidemia Hypothyroidism. Morbid obesity. End-stage renal disease on hemodialysis. History of nephrotic syndrome. Degenerative joint disease. Plan Check serial enzymes and EKG Change carvedilol to metoprolol and aspirin orders. Consider adding BiDil as blood pressure tolerates. Reviewed the prior angiogram from 2019 patient had multivessel CAD with moderate in-stent proximal LAD and mid critical LAD stenosis and diffuse distal disease not suitable for CABG and severe sequential stenosis in obtuse  marginal 2 and occluded PDA and PLV branches.  Discussed with patient at length regarding various options of treatment.  Patient request for second opinion prior to deciding which way to proceed. Continue with dual antiplatelet medications, statin beta blocker and heparin for now.  Charolette Forward 10/13/2019, 3:31 PM

## 2019-10-13 NOTE — Progress Notes (Signed)
CRITICAL VALUE ALERT  Critical Value:  Troponin 1541  Date & Time Notied:  11/17 0529  Provider Notified: [place]  Orders Received/Actions taken: no new orders

## 2019-10-13 NOTE — Progress Notes (Signed)
CRITICAL VALUE ALERT  Critical Value:  Troponin 1528  Date & Time Notied:  11/17 0253  Provider Notified: Darrick Meigs  Orders Received/Actions taken: pending any new orders

## 2019-10-13 NOTE — Progress Notes (Signed)
   Patient requested second opinion.  I personally reviewed her prior cath films.  She now presents with NSTEMI.  She was at [facility] prior to transfer to [facility], seen by Dr. Bronson Ing.  GEN: Well nourished, well developed, in no acute distress , sleepy HEENT: normal   Cardiac: RRR; no murmurs, rubs, or gallops,no edema  Respiratory: , normal work of breathing GI: obese MS: no deformity or atrophy  Skin: warm and dry, no rash Neuro:  sleepy;  Psych: euthymic mood, flat affect  1. I also recommend cardiac cath.  Unfortunately, she has diffuse, calcific CAD in addition to her renal failure, which will complicate any effort at PCI.  Tortuous RCA as well.    I communicated my findings to the nurse.  THe patient will be made NPO past midnight for possible cath tomorrow.   Jettie Booze, MD

## 2019-10-13 NOTE — Progress Notes (Signed)
ANTICOAGULATION CONSULT NOTE - Initial Consult  Pharmacy Consult for heaprin Indication: ACS/afib  Allergies  Allergen Reactions  . Activase [Alteplase] Shortness Of Breath  . Bee Pollen Anaphylaxis  . Warfarin Sodium Nausea And Vomiting and Rash    Patient Measurements: Height: 5\' 3"  (160 cm) Weight: 225 lb 12 oz (102.4 kg) IBW/kg (Calculated) : 52.4 Heparin Dosing Weight: 77 kg  Vital Signs: Temp: 97.3 F (36.3 C) (11/17 0747) Temp Source: Oral (11/17 0747) BP: 127/107 (11/17 0747) Pulse Rate: 110 (11/17 0106)  Labs: Recent Labs    10/12/19 1925 10/12/19 2119 10/13/19 0154 10/13/19 0408 10/13/19 0740  HGB 11.6*  --  11.8*  --   --   HCT 36.9  --  37.7  --   --   PLT 227  --  237  --   --   HEPARINUNFRC  --   --   --   --  0.32  CREATININE 5.95*  --  6.73*  --   --   TROPONINIHS 44* 331* 1,528* 1,541*  --     Estimated Creatinine Clearance: 11.6 mL/min (A) (by C-G formula based on SCr of 6.73 mg/dL (H)).   Medical History: Past Medical History:  Diagnosis Date  . Anemia   . ASCVD (arteriosclerotic cardiovascular disease)   . Calciphylaxis   . Chronic abdominal wound infection   . Dialysis patient ([place])   . ESRD (end stage renal disease) ([place])    Due to membranous GN dialysis 09/1996; peritoneal dialysis --? peitonitis; difficult vascular access  . GERD (gastroesophageal reflux disease)   . Hashimoto thyroiditis   . Hyperparathyroidism   . Hypertension   . Hypothyroidism   . Medically noncompliant   . Morbid obesity ([place])     Medications:  Medications Prior to Admission  Medication Sig Dispense Refill Last Dose  . albuterol (PROVENTIL HFA;VENTOLIN HFA) 108 (90 BASE) MCG/ACT inhaler Inhale 2 puffs into the lungs every 6 (six) hours as needed. Shortness of breath   10/11/2019 at Unknown time  . atorvastatin (LIPITOR) 80 MG tablet Take 1 tablet (80 mg total) by mouth daily at 6 PM. (Patient taking differently: Take 80 mg by mouth every morning. ) 30  tablet 0 10/12/2019 at Unknown time  . b complex-vitamin c-folic acid (NEPHRO-VITE) 0.8 MG TABS tablet Take 1 tablet by mouth daily.   10/12/2019 at Unknown time  . carvedilol (COREG) 3.125 MG tablet Take 3.125 mg by mouth 2 (two) times daily with a meal.    10/12/2019 at 800  . HYDROcodone-acetaminophen (NORCO/VICODIN) 5-325 MG tablet One tablet every six hours for pain.  Limit 7 days. (Patient taking differently: Take 1 tablet by mouth every 6 (six) hours as needed for moderate pain (knee pain). ) 28 tablet 0 Past Month at Unknown time  . ibuprofen (ADVIL) 200 MG tablet Take 1,000 mg by mouth every morning. *May take additional dose in the evening as needed for pain   10/12/2019 at Unknown time  . levothyroxine (SYNTHROID, LEVOTHROID) 300 MCG tablet Take 300 mcg by mouth at bedtime. SYNTHROID ONLY-BRAND NAME MEDICALLY NECESSARY   10/11/2019 at Unknown time  . losartan (COZAAR) 25 MG tablet Take 0.5 tablets (12.5 mg total) by mouth daily. 30 tablet 3 10/12/2019 at Unknown time  . nitroGLYCERIN (NITROSTAT) 0.4 MG SL tablet Place 1 tablet (0.4 mg total) under the tongue every 5 (five) minutes x 3 doses as needed for chest pain. 25 tablet 12 10/12/2019 at Unknown time  . sevelamer carbonate (RENVELA) 800  MG tablet Take 1,600-3,200 mg by mouth 3 (three) times daily with meals. 3200mg  with meals and 1600mg  with snacks   10/12/2019 at morning  . ticagrelor (BRILINTA) 90 MG TABS tablet Take 1 tablet (90 mg total) by mouth 2 (two) times daily. 60 tablet 3 10/12/2019 at 800  . EPINEPHrine 0.3 mg/0.3 mL IJ SOAJ injection Inject 0.3 mg into the muscle as needed.   Not Taking at Unknown time    Assessment: Pharmacy consult for patient with ACS/atrial fibrillation.  Patient has elevated troponin with 1541.  Patient is not on anticoagulation prior to admission.  HL therapeutic at 0.32  Goal of Therapy:  Heparin level 0.3-0.7 units/ml Monitor platelets by anticoagulation protocol: Yes   Plan:  Continue  heparin infusion at 1000 units/hr. Check anti-Xa level in 6 hours and daily while on heparin. Continue to monitor H&H and platelets.  Margot Ables, PharmD Clinical Pharmacist 10/13/2019 8:42 AM

## 2019-10-13 NOTE — Progress Notes (Signed)
[place] for heaprin Indication: ACS/afib  Allergies  Allergen Reactions  . Activase [Alteplase] Shortness Of Breath  . Bee Pollen Anaphylaxis  . Warfarin Sodium Nausea And Vomiting and Rash    Patient Measurements: Height: 5\' 3"  (160 cm) Weight: 225 lb 12 oz (102.4 kg) IBW/kg (Calculated) : 52.4 Heparin Dosing Weight: 77 kg  Vital Signs: Temp: 98.3 F (36.8 C) (11/17 1431) Temp Source: Oral (11/17 1431) BP: 105/66 (11/17 1431) Pulse Rate: 62 (11/17 1114)  Labs: Recent Labs    10/12/19 1925 10/12/19 2119 10/13/19 0154 10/13/19 0408 10/13/19 0740 10/13/19 1534  HGB 11.6*  --  11.8*  --   --   --   HCT 36.9  --  37.7  --   --   --   PLT 227  --  237  --   --   --   HEPARINUNFRC  --   --   --   --  0.32 0.11*  CREATININE 5.95*  --  6.73*  --   --   --   TROPONINIHS 44* 331* 1,528* 1,541*  --   --     Estimated Creatinine Clearance: 11.6 mL/min (A) (by C-G formula based on SCr of 6.73 mg/dL (H)).   Assessment: Pharmacy consult for patient with ACS/atrial fibrillation.  Patient has elevated troponin with 1541.  Patient is not on anticoagulation prior to admission. -heparin level down to 0.11  Goal of Therapy:  Heparin level 0.3-0.7 units/ml Monitor platelets by anticoagulation protocol: Yes   Plan:  -Heparin bolus 2000 units and increase heparin to  1200 units/hr -Heparin level in 8 hours and daily wth CBC daily  Hildred Laser, PharmD Clinical Pharmacist **Pharmacist phone directory can now be found on [email or web address] ([email or web address]).  Listed under [place].

## 2019-10-13 NOTE — Consult Note (Addendum)
Cardiology Consult    Patient ID: Lori [name]; [id number]; 08-26-1971   Admit date: 10/12/2019 Date of Consult: 10/13/2019  Primary Care Provider: Patient, No Pcp Per Primary Cardiologist: Dr. Terrence Dupont  Patient Profile    Lori [name] is a 47 y.o. female with past medical history of CAD (s/p prior stenting of LAD, NSTEMI in 01/2018 requiring DES x3 to RCA given spiral dissection from ostium to mid-RCA but residual disease along distal RCA, LAD and 2nd Mrg), ischemic cardiomyopathy (EF 35-45% by cath in 01/2018), HTN, HLD, and ESRD (on HD - MWF schedule) who is being seen today for the evaluation of NSTEMI at the request of Dr. Darrick Meigs.   History of Present Illness    Lori [name] presented to [facility] ED on 10/12/2019 for evaluation of chest pain which started following HD. She reports being in her usual state of health until yesterday evening when she developed a cold sensation along her sternal region. Reports a pressure developed at that time and did not resolve with SL NTGx1. Took an additional tablet later but still had residual symptoms and developed associated dyspnea, therefore she came to the ED for further evaluation. She denies any associated nausea, vomiting, or diaphoresis. No recent orthopnea, PND or edema.   Says she followed-up with Dr. Terrence Dupont last month and was overall doing well at that time. Denies any recent chest pain or dyspnea leading up to onset of symptoms yesterday. She is unaware of any history of atrial fibrillation or cardiac arrhythmias but is also unaware her EF was reduced by prior cath (unable to assess any records in the interim to see if a follow-up echo was obtained).   She denies any recurrent pain this morning. Says her left shoulder is sore but she has been sleeping on this side overnight and it improved with positional changes.   Initial labs show WBC 6.4, Hgb 11.6, platelets 227, Na+ 134, K+ 3.7 and creatinine 5.95. COVID testing pending. Initial HS  Troponin 44 with repeat values of 331, 1528 and 1541. CXR shows cardiomegaly with no acute disease. EKG shows atrial fibrillation, HR 123 with known LBBB but possible p-waves noted in AVF.   She has been started on Heparin along with being continued on PTA Brilinta, BB and statin therapy.    Past Medical History:  Diagnosis Date  . Anemia   . ASCVD (arteriosclerotic cardiovascular disease)    a. s/p prior stenting of LAD b. NSTEMI in 01/2018 requiring DES x3 to RCA given spiral dissection from ostium to mid-RCA but residual disease along distal RCA, LAD and 2nd Mrg  . Calciphylaxis   . Chronic abdominal wound infection   . Dialysis patient ([place])   . ESRD (end stage renal disease) ([place])    Due to membranous GN dialysis 09/1996; peritoneal dialysis --? peitonitis; difficult vascular access  . GERD (gastroesophageal reflux disease)   . Hashimoto thyroiditis   . Hyperparathyroidism   . Hypertension   . Hypothyroidism   . Medically noncompliant   . Morbid obesity ([place])     Past Surgical History:  Procedure Laterality Date  . [place]  . LEFT HEART CATH AND CORONARY ANGIOGRAPHY N/A 02/11/2018   Procedure: LEFT HEART CATH AND CORONARY ANGIOGRAPHY;  Surgeon: Charolette Forward, MD;  Location: [place] [facility];  Service: Cardiovascular;  Laterality: N/A;  . THYROIDECTOMY, PARTIAL     Resectin of left lobe with reimplantation in the forearm, small focus of papillary carcinoma incidentally noted at  pathology - 2000 and Hashimoto's thyrdoiditis in 2001  . TONSILLECTOMY AND ADENOIDECTOMY       Home Medications:  Prior to Admission medications   Medication Sig Start Date End Date Taking? Authorizing Provider  albuterol (PROVENTIL HFA;VENTOLIN HFA) 108 (90 BASE) MCG/ACT inhaler Inhale 2 puffs into the lungs every 6 (six) hours as needed. Shortness of breath   Yes [provider]  atorvastatin (LIPITOR) 80 MG tablet Take 1 tablet (80 mg total) by mouth daily at 6 PM. Patient taking  differently: Take 80 mg by mouth every morning.  02/14/18  Yes Charolette Forward, MD  b complex-vitamin c-folic acid (NEPHRO-VITE) 0.8 MG TABS tablet Take 1 tablet by mouth daily.   Yes [provider]  carvedilol (COREG) 3.125 MG tablet Take 3.125 mg by mouth 2 (two) times daily with a meal.  07/28/19  Yes [provider]  HYDROcodone-acetaminophen (NORCO/VICODIN) 5-325 MG tablet One tablet every six hours for pain.  Limit 7 days. Patient taking differently: Take 1 tablet by mouth every 6 (six) hours as needed for moderate pain (knee pain).  10/08/19  Yes Sanjuana Kava, MD  ibuprofen (ADVIL) 200 MG tablet Take 1,000 mg by mouth every morning. *May take additional dose in the evening as needed for pain   Yes [provider]  levothyroxine (SYNTHROID, LEVOTHROID) 300 MCG tablet Take 300 mcg by mouth at bedtime. SYNTHROID ONLY-BRAND NAME MEDICALLY NECESSARY   Yes [provider]  losartan (COZAAR) 25 MG tablet Take 0.5 tablets (12.5 mg total) by mouth daily. 02/15/18  Yes Charolette Forward, MD  nitroGLYCERIN (NITROSTAT) 0.4 MG SL tablet Place 1 tablet (0.4 mg total) under the tongue every 5 (five) minutes x 3 doses as needed for chest pain. 02/14/18  Yes Charolette Forward, MD  sevelamer carbonate (RENVELA) 800 MG tablet Take 1,600-3,200 mg by mouth 3 (three) times daily with meals. 3200mg  with meals and 1600mg  with snacks   Yes [provider]  ticagrelor (BRILINTA) 90 MG TABS tablet Take 1 tablet (90 mg total) by mouth 2 (two) times daily. 02/14/18  Yes Charolette Forward, MD  EPINEPHrine 0.3 mg/0.3 mL IJ SOAJ injection Inject 0.3 mg into the muscle as needed.    [provider]    Inpatient Medications: Scheduled Meds: . aspirin EC  81 mg Oral Daily  . atorvastatin  80 mg Oral q morning - 10a  . carvedilol  6.25 mg Oral BID WC  . Chlorhexidine Gluconate Cloth  6 each Topical Daily  . levothyroxine  300 mcg Oral Q0600  . sevelamer carbonate  3,200 mg Oral TID  WC  . sodium chloride flush  3 mL Intravenous Once  . sodium chloride flush  3 mL Intravenous Q12H  . ticagrelor  90 mg Oral BID   Continuous Infusions: . sodium chloride    . heparin 1,000 Units/hr (10/13/19 0105)   PRN Meds: sodium chloride, HYDROcodone-acetaminophen, nitroGLYCERIN, ondansetron **OR** ondansetron (ZOFRAN) IV, sevelamer carbonate, sodium chloride flush  Allergies:    Allergies  Allergen Reactions  . Activase [Alteplase] Shortness Of Breath  . Bee Pollen Anaphylaxis  . Warfarin Sodium Nausea And Vomiting and Rash    Social History:   Social History   Socioeconomic History  . Marital status: Married    Spouse name: Not on file  . Number of children: Not on file  . Years of education: Not on file  . Highest education level: Not on file  Occupational History  . Not on file  Social Needs  .  Financial resource strain: Not on file  . Food insecurity    Worry: Not on file    Inability: Not on file  . Transportation needs    Medical: Not on file    Non-medical: Not on file  Tobacco Use  . Smoking status: Former Research scientist (life sciences)  . Smokeless tobacco: Never Used  Substance and Sexual Activity  . Alcohol use: No  . Drug use: No  . Sexual activity: Yes    Birth control/protection: Surgical  Lifestyle  . Physical activity    Days per week: Not on file    Minutes per session: Not on file  . Stress: Not on file  Relationships  . Social Herbalist on phone: Not on file    Gets together: Not on file    Attends religious service: Not on file    Active member of club or organization: Not on file    Attends meetings of clubs or organizations: Not on file    Relationship status: Not on file  . Intimate partner violence    Fear of current or ex partner: Not on file    Emotionally abused: Not on file    Physically abused: Not on file    Forced sexual activity: Not on file  Other Topics Concern  . Not on file  Social History Narrative   Married   No  regular exercise     Family History:    Family History  Problem Relation Age of Onset  . Coronary artery disease Mother   . Kidney disease Father   . Diabetes Sister       Review of Systems    General:  No chills, fever, night sweats or weight changes.  Cardiovascular:  No edema, orthopnea, palpitations, paroxysmal nocturnal dyspnea. Positive for chest pain and dyspnea on exertion.  Dermatological: No rash, lesions/masses Respiratory: No cough, dyspnea Urologic: No hematuria, dysuria Abdominal:   No nausea, vomiting, diarrhea, bright red blood per rectum, melena, or hematemesis Neurologic:  No visual changes, wkns, changes in mental status. All other systems reviewed and are otherwise negative except as noted above.  Physical Exam/Data    Vitals:   10/13/19 0107 10/13/19 0109 10/13/19 0151 10/13/19 0747  BP:    (!) 127/107  Pulse:      Resp: 16 18  (!) 8  Temp:   97.6 F (36.4 C) (!) 97.3 F (36.3 C)  TempSrc:   Oral Oral  SpO2:      Weight:   102.4 kg   Height:   5\' 3"  (1.6 m)    No intake or output data in the 24 hours ending 10/13/19 0919 Filed Weights   10/12/19 1908 10/13/19 0151  Weight: 103 kg 102.4 kg   Body mass index is 39.99 kg/m.   General: Pleasant, female appearing in NAD Psych: Normal affect. Neuro: Alert and oriented X 3. Moves all extremities spontaneously. HEENT: Normal  Neck: Supple without bruits or JVD. Lungs:  Resp regular and unlabored, CTA without wheezing or rales. Heart: Tachycardiac but regular, no s3, s4, or murmurs. Abdomen: Soft, non-tender, non-distended, BS + x 4.  Extremities: No clubbing or cyanosis or edema. DP/PT/Radials 2+ and equal bilaterally.   EKG:  The EKG was personally reviewed and demonstrates: Atrial fibrillation, HR 123 with known LBBB but possible p-waves noted in AVF.    Labs/Studies     Relevant CV Studies:  Cardiac Catheterization: 01/2018  Prox RCA lesion is 85% stenosed.  Mid RCA to  Dist RCA  lesion is 70% stenosed.  Ost RPDA to RPDA lesion is 100% stenosed.  Ost 2nd Mrg lesion is 70% stenosed.  2nd Mrg-1 lesion is 90% stenosed.  2nd Mrg-2 lesion is 90% stenosed.  Prox LAD lesion is 50% stenosed.  Prox LAD to Mid LAD lesion is 80% stenosed.  Dist LAD lesion is 95% stenosed.  Mid RCA lesion is 20% stenosed.  Post intervention, there is a 0% residual stenosis.  A drug-eluting stent was successfully placed using a STENT SIERRA 3.50 X 23 MM.  Post intervention, there is a 0% residual stenosis.  A stent was successfully placed.  There is mild to moderate left ventricular systolic dysfunction.  The left ventricular ejection fraction is 35-45% by visual estimate.  Laboratory Data:  Chemistry Recent Labs  Lab 10/12/19 1925 10/13/19 0154  NA 134* 137  K 3.7 4.3  CL 93* 97*  CO2 28 27  GLUCOSE 109* 104*  BUN 23* 25*  CREATININE 5.95* 6.73*  CALCIUM 8.1* 8.3*  GFRNONAA 8* 7*  GFRAA 9* 8*  ANIONGAP 13 13    Recent Labs  Lab 10/13/19 0154  PROT 7.3  ALBUMIN 4.0  AST 24  ALT 16  ALKPHOS 64  BILITOT 0.5   Hematology Recent Labs  Lab 10/12/19 1925 10/13/19 0154  WBC 6.4 7.1  RBC 3.75* 3.84*  HGB 11.6* 11.8*  HCT 36.9 37.7  MCV 98.4 98.2  MCH 30.9 30.7  MCHC 31.4 31.3  RDW 14.6 14.5  PLT 227 237   Cardiac EnzymesNo results for input(s): TROPONINI in the last 168 hours. No results for input(s): TROPIPOC in the last 168 hours.  BNPNo results for input(s): BNP, PROBNP in the last 168 hours.  DDimer No results for input(s): DDIMER in the last 168 hours.  Radiology/Studies:  Dg Chest Port 1 View  Result Date: 10/12/2019 CLINICAL DATA:  Chest pain EXAM: PORTABLE CHEST 1 VIEW COMPARISON:  02/02/2019 FINDINGS: Right-sided central venous catheter tip over the right atrium. Clips at the thoracic inlet. Cardiomegaly without acute airspace disease, pleural effusion or edema. No pneumothorax. Right axillary vascular stents. IMPRESSION: No active  disease.  Cardiomegaly. Electronically Signed   By: Donavan Foil M.D.   On: 10/12/2019 20:31     Assessment & Plan    1. NSTEMI - presented with new-onset chest pain yesterday evening with associated dyspnea. Initial HS Troponin 44 with repeat values of 331, 1528 and 1541. EKG shows known LBBB. - she denies any recurrent pain this morning. Given elevated enzymes and presenting symptoms, would plan for a cardiac catheterization for definitive evaluation. Spoke with the [employer] and given she is a Dr. Terrence Dupont patient, will need to be evaluated by him upon arrival to [facility] and catheterization arranged at that time. Hospitalist made aware of the need for transfer.  - continue Heparin for anticoagulation. Remains on PTA ASA, Brilinta, statin and BB therapy.   2. CAD - s/p prior stenting of LAD with NSTEMI in 01/2018 requiring DES x3 to RCA given spiral dissection from ostium to mid-RCA. - plan for repeat ischemic evaluation as outlined above.  - continue ASA, Brilinta, statin and BB therapy.   3. Ischemic cardiomyopathy - EF was reduced at 35-45% by cath in 01/2018. She is unaware of any recent echocardiograms. Will plan to obtain a repeat echo to reassess LV function and wall motion.  - continue Coreg. Was on Losartan PTA but will hold for now given soft BP. Given her ESRD, would consider the  use of Hydralazine and Nitrates if EF remains reduced and BP allows.   4. Atrial Tachycardia - difficult to distinguish from atrial fibrillation and sinus tachycardia on initial EKG as p-waves are noted in some leads. Intermittent p-waves noted on telemetry as well but this morning her HR is variable from the 90's to 130's while talking. Will ask for a repeat 12-Lead EKG.  - will titrate Coreg to 6.25mg  BID for additional rate-control.  - continue Heparin for anticoagulation. Pending repeat EKG results, would need to consider the use of long-term anticoagulation following catheterization.   5. HLD -  recheck FLP. Continue Atorvastatin 80mg  daily.   6. ESRD - on HD - MWF schedule. Followed by Dr. Marval Regal with [facility]. If she remains admitted on Wednesday, will need to consult Nephrology for inpatient HD.     For questions or updates, please contact [place] Please consult [email or web address] for contact info under Cardiology/STEMI.  Signed, Erma Heritage, PA-C 10/13/2019, 9:19 AM Pager: [phone]  The patient was seen and examined, and I agree with the history, physical exam, assessment and plan as documented above, with modifications as noted below. I have also personally reviewed all relevant documentation, old records, labs, and both radiographic and cardiovascular studies. I have also independently interpreted old and new ECG's.  Briefly, this is a 48 year old woman with a history of coronary disease and prior stenting of the LAD and RCA, end-stage renal disease on hemodialysis, hypertension, hyperlipidemia, and ischemic cardiomyopathy.  Yesterday after dialysis she developed a sensation of "cold chest pressure "and describes it like "a large ice cubes sitting on my chest ". It was accompanied by shortness of breath. It was not alleviated with nitroglycerin.  ECG on admission appears to demonstrate atrial fibrillation with left bundle branch block. She has no known prior history of atrial fibrillation.  This morning she denies any chest pain but does describe orthopnea.  Troponins elevated up to 1541 consistent with non-STEMI.  Currently on IV heparin, carvedilol, Brilinta and statin.  We will plan to transfer to [facility] for cardiac catheterization and evaluation by her primary cardiologist, Dr. Terrence Dupont.  She had been on losartan prior to admission but given her end-stage renal disease I would recommend against this and would suggest hydralazine and nitrates.  She will need a follow-up echocardiogram.   Lori Sable, MD, [facility]  10/13/2019 9:30  AM

## 2019-10-13 NOTE — Progress Notes (Signed)
1330 Pt was having ECHO performed. Total of 3 mLs of Definity was given to complete echo. IV flushed with 5 mLs of Normal saline afterwards. [facility] present at time as well to transfer pt to [facility]. Approximately 1 minute after echo complete pt stated she felt warm and started labor breathing and panicking. Concern for allergy since pt had just received received Definity, 1 mg of epi was given, 2 L [place] placed on pt, zofran also given as pt stated she was also feeling nauseous. Pt quickly recovered and vs sign within normal limits.

## 2019-10-13 NOTE — Progress Notes (Signed)
Pt is settled in her room. She is not complaining of any chest pain at the present time. Cramping in both legs which patient states is common. Gave patient pain medicine as requested. Will continue to monitor patient.

## 2019-10-13 NOTE — Progress Notes (Signed)
Pt still running sinus tach - a-fib. She has not complained of any chest pain, only leg cramps and non-radiating shoulder pain that she describes as feeling stiff. Continuing to monitor patient.

## 2019-10-13 NOTE — H&P (Signed)
Lori [name] H&P    Patient Demographics:    Lori [name], is a 48 y.o. female  MRN: [id number]  DOB - 10/26/1971  Admit Date - 10/12/2019  Referring MD/NP/PA: Dr Roderic Palau  Outpatient Primary MD for the patient is Patient, No Pcp Per  Patient coming from: Home  Chief complaint- Chest pain   HPI:    Generra [name]  is a 48 y.o. female, with h/o CAD s/p stent to LAD, hypertension, ESRD on HD Monday Wednesday Friday, hypothyroidism, hyperparathyroidism, GERD came to hospital with chest pain, which started after she came back from  hemodialysis. She denies being short of breath. She denies nausea, vomiting or diarrhea.  As per patient, chest pain did not improve with nitroglycerin so she took another nitroglycerin and was coming to the ED.  Pain got relieved after she arrived in the ED.  Patient's last heart cath was done by Dr. Terrence Dupont in 2019 with placement of drug-eluting stent. In the ED initial troponin was 44, repeat troponin was 331.  Dr. Roderic Palau spoke to Dr. Terrence Dupont, who recommended that patient be started on heparin, full dose anticoagulation. She was also found to be in A. fib with RVR.   Review of systems:    In addition to the HPI above,   All other systems reviewed and are negative.    Past History of the following :    Past Medical History:  Diagnosis Date  . Anemia   . ASCVD (arteriosclerotic cardiovascular disease)   . Calciphylaxis   . Chronic abdominal wound infection   . Dialysis patient ([place])   . 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 peitonitis; difficult vascular access  . GERD (gastroesophageal reflux disease)   . Hashimoto thyroiditis   . Hyperparathyroidism   . Hypertension   . Hypothyroidism   . Medically noncompliant   . Morbid obesity ([place])       Past Surgical History:  Procedure Laterality Date  . [place]  . LEFT  HEART CATH AND CORONARY ANGIOGRAPHY N/A 02/11/2018   Procedure: LEFT HEART CATH AND CORONARY ANGIOGRAPHY;  Surgeon: Charolette Forward, MD;  Location: [facility];  Service: Cardiovascular;  Laterality: N/A;  . THYROIDECTOMY, PARTIAL     Resectin of left lobe with reimplantation in the forearm, small focus of papillary carcinoma incidentally noted at pathology - 2000 and Hashimoto's thyrdoiditis in 2001  . TONSILLECTOMY AND ADENOIDECTOMY        Social History:      Social History   Tobacco Use  . Smoking status: Former Research scientist (life sciences)  . Smokeless tobacco: Never Used  Substance Use Topics  . Alcohol use: No       Family History :     Family History  Problem Relation Age of Onset  . Coronary artery disease Mother   . Kidney disease Father   . Diabetes Sister       Home Medications:   Prior to Admission medications   Medication Sig Start Date End Date  Taking? Authorizing Provider  albuterol (PROVENTIL HFA;VENTOLIN HFA) 108 (90 BASE) MCG/ACT inhaler Inhale 2 puffs into the lungs every 6 (six) hours as needed. Shortness of breath   Yes [provider]  atorvastatin (LIPITOR) 80 MG tablet Take 1 tablet (80 mg total) by mouth daily at 6 PM. Patient taking differently: Take 80 mg by mouth every morning.  02/14/18  Yes Charolette Forward, MD  b complex-vitamin c-folic acid (NEPHRO-VITE) 0.8 MG TABS tablet Take 1 tablet by mouth daily.   Yes [provider]  carvedilol (COREG) 3.125 MG tablet Take 3.125 mg by mouth 2 (two) times daily with a meal.  07/28/19  Yes [provider]  HYDROcodone-acetaminophen (NORCO/VICODIN) 5-325 MG tablet One tablet every six hours for pain.  Limit 7 days. Patient taking differently: Take 1 tablet by mouth every 6 (six) hours as needed for moderate pain (knee pain).  10/08/19  Yes Sanjuana Kava, MD  ibuprofen (ADVIL) 200 MG tablet Take 1,000 mg by mouth every morning. *May take additional dose in the evening as needed for pain   Yes  [provider]  levothyroxine (SYNTHROID, LEVOTHROID) 300 MCG tablet Take 300 mcg by mouth at bedtime. SYNTHROID ONLY-BRAND NAME MEDICALLY NECESSARY   Yes [provider]  losartan (COZAAR) 25 MG tablet Take 0.5 tablets (12.5 mg total) by mouth daily. 02/15/18  Yes Charolette Forward, MD  nitroGLYCERIN (NITROSTAT) 0.4 MG SL tablet Place 1 tablet (0.4 mg total) under the tongue every 5 (five) minutes x 3 doses as needed for chest pain. 02/14/18  Yes Charolette Forward, MD  sevelamer carbonate (RENVELA) 800 MG tablet Take 1,600-3,200 mg by mouth 3 (three) times daily with meals. 3200mg  with meals and 1600mg  with snacks   Yes [provider]  ticagrelor (BRILINTA) 90 MG TABS tablet Take 1 tablet (90 mg total) by mouth 2 (two) times daily. 02/14/18  Yes Charolette Forward, MD  EPINEPHrine 0.3 mg/0.3 mL IJ SOAJ injection Inject 0.3 mg into the muscle as needed.    [provider]     Allergies:     Allergies  Allergen Reactions  . Activase [Alteplase] Shortness Of Breath  . Bee Pollen Anaphylaxis  . Warfarin Sodium Nausea And Vomiting and Rash     Physical Exam:   Vitals  Blood pressure (!) 112/95, pulse (!) 34, temperature 97.6 F (36.4 C), temperature source Oral, resp. rate (!) 21, height 5\' 3"  (1.6 m), weight 103 kg, SpO2 100 %.  1.  General: Appears in no acute distress  2. Psychiatric: Alert, oriented x3, intact insight and judgment  3. Neurologic: Cranial nerves II through XII grossly intact, no focal deficit noted  4. HEENMT:  Atraumatic normocephalic, extraocular muscles are intact  5. Respiratory : Clear to auscultation bilaterally, no wheezing or crackles  6. Cardiovascular : S1-S2, regular, no murmur auscultated  7. Gastrointestinal:  Abdomen is soft, nontender, no organomegaly  8. Skin:  No rash noted      Data Review:    CBC Recent Labs  Lab 10/12/19 1925  WBC 6.4  HGB 11.6*  HCT 36.9  PLT 227  MCV 98.4  MCH 30.9  MCHC  31.4  RDW 14.6   ------------------------------------------------------------------------------------------------------------------  Results for orders placed or performed during the hospital encounter of 10/12/19 (from the past 48 hour(s))  Basic metabolic panel     Status: Abnormal   Collection Time: 10/12/19  7:25 PM  Result Value Ref Range   Sodium 134 (L) 135 - 145 mmol/L  Potassium 3.7 3.5 - 5.1 mmol/L   Chloride 93 (L) 98 - 111 mmol/L   CO2 28 22 - 32 mmol/L   Glucose, Bld 109 (H) 70 - 99 mg/dL   BUN 23 (H) 6 - 20 mg/dL   Creatinine, Ser 5.95 (H) 0.44 - 1.00 mg/dL   Calcium 8.1 (L) 8.9 - 10.3 mg/dL   GFR calc non Af Amer 8 (L) >60 mL/min   GFR calc Af Amer 9 (L) >60 mL/min   Anion gap 13 5 - 15    Comment: Performed at [facility], [address] [place], [place] [address]  CBC     Status: Abnormal   Collection Time: 10/12/19  7:25 PM  Result Value Ref Range   WBC 6.4 4.0 - 10.5 K/uL   RBC 3.75 (L) 3.87 - 5.11 MIL/uL   Hemoglobin 11.6 (L) 12.0 - 15.0 g/dL   HCT 36.9 36.0 - 46.0 %   MCV 98.4 80.0 - 100.0 fL   MCH 30.9 26.0 - 34.0 pg   MCHC 31.4 30.0 - 36.0 g/dL   RDW 14.6 11.5 - 15.5 %   Platelets 227 150 - 400 K/uL   nRBC 0.0 0.0 - 0.2 %    Comment: Performed at [facility], [address] [place], [place] [address]  Troponin I (High Sensitivity)     Status: Abnormal   Collection Time: 10/12/19  7:25 PM  Result Value Ref Range   Troponin I (High Sensitivity) 44 (H) <18 ng/L    Comment: (NOTE) Elevated high sensitivity troponin I (hsTnI) values and significant  changes across serial measurements may suggest ACS but many other  chronic and acute conditions are known to elevate hsTnI results.  Refer to the "Links" section for chest pain algorithms and additional  guidance. Performed at [facility], [address], [place], [place] [address]   Troponin I (High Sensitivity)     Status: Abnormal   Collection Time: 10/12/19  9:19 PM  Result Value Ref Range    Troponin I (High Sensitivity) 331 (HH) <18 ng/L    Comment: CRITICAL RESULT CALLED TO, READ BACK BY AND VERIFIED WITH: ELLIS,C AT 2208 ON 11.16.20 BY ISLEY,B (NOTE) Elevated high sensitivity troponin I (hsTnI) values and significant  changes across serial measurements may suggest ACS but many other  chronic and acute conditions are known to elevate hsTnI results.  Refer to the Links section for chest pain algorithms and additional  guidance. Performed at [facility], [address] [place],  [address]     Chemistries  Recent Labs  Lab 10/12/19 1925  NA 134*  K 3.7  CL 93*  CO2 28  GLUCOSE 109*  BUN 23*  CREATININE 5.95*  CALCIUM 8.1*    --------------------------------------------------------------------------------------------------------------- Urine analysis: No results found for: COLORURINE, APPEARANCEUR, LABSPEC, PHURINE, GLUCOSEU, HGBUR, BILIRUBINUR, KETONESUR, PROTEINUR, UROBILINOGEN, NITRITE, LEUKOCYTESUR    Imaging Results:    Dg Chest Port 1 View  Result Date: 10/12/2019 CLINICAL DATA:  Chest pain EXAM: PORTABLE CHEST 1 VIEW COMPARISON:  02/02/2019 FINDINGS: Right-sided central venous catheter tip over the right atrium. Clips at the thoracic inlet. Cardiomegaly without acute airspace disease, pleural effusion or edema. No pneumothorax. Right axillary vascular stents. IMPRESSION: No active disease.  Cardiomegaly. Electronically Signed   By: Donavan Foil M.D.   On: 10/12/2019 20:31    My personal review of EKG: Rhythm atrial fibrillation with RVR   Assessment & Plan:    Active Problems:   Chest pain   1.  Acute coronary syndrome-patient presented with chest pain with elevated troponin.  Chest pain has resolved.  Started on heparin anticoagulation per pharmacy.  Continue Brilinta 90 mg twice a day.  We will cycle troponin x2.  Consult cardiology in a.m.  2. Atrial fibrillation-?  New onset, heart rate is controlled.  Continue heparin as above.   Continue Coreg 3.125 mg twice daily  for rate control.  3. ESRD on hemodialysis-patient gets hemodialysis Monday Wednesday Friday.  Her last dialysis was today.  Consult nephrology in a.m. for dialysis on Wednesday.  4. Hypothyroidism-continue Synthroid  5. Hypertension-continue Cozaar, Coreg.    DVT Prophylaxis-   Lovenox   AM Labs Ordered, also please review Full Orders  Family Communication: Admission, patients condition and plan of care including tests being ordered have been discussed with the patient who indicate understanding and agree with the plan and Code Status.  Code Status:  Full code  Admission status: Inpatient: Based on patients clinical presentation and evaluation of above clinical data, I have made determination that patient meets Inpatient criteria at this time.  Time spent in minutes : 60 min   Oswald Hillock M.D on 10/13/2019 at 12:09 AM

## 2019-10-13 NOTE — Progress Notes (Signed)
ASSUMPTION OF CARE NOTE   10/13/2019 11:02 AM  Lori [name] was seen and examined.  The H&P by the admitting provider, orders, imaging was reviewed.  I spoke with cardiology and patient needs to transfer to [facility] for cath with Dr. Terrence Dupont who is aware she is coming.  I spoke with Dr. Marylyn Ishihara with [facility] who accepted patient and will assume care when she arrives.   I spoke with [place] with cardiology service who said that cardiac telemetry should be ok as she is chest pain free at this time.   Transfer orders completed.   Vitals:   10/13/19 0900 10/13/19 1000  BP: 94/67 115/61  Pulse: (!) 107 (!) 108  Resp: (!) 24 17  Temp:    SpO2: 100% 100%    Results for orders placed or performed during the hospital encounter of 10/12/19  MRSA PCR Screening   Specimen: Nasal Mucosa; Nasopharyngeal  Result Value Ref Range   MRSA by PCR NEGATIVE NEGATIVE  Basic metabolic panel  Result Value Ref Range   Sodium 134 (L) 135 - 145 mmol/L   Potassium 3.7 3.5 - 5.1 mmol/L   Chloride 93 (L) 98 - 111 mmol/L   CO2 28 22 - 32 mmol/L   Glucose, Bld 109 (H) 70 - 99 mg/dL   BUN 23 (H) 6 - 20 mg/dL   Creatinine, Ser 5.95 (H) 0.44 - 1.00 mg/dL   Calcium 8.1 (L) 8.9 - 10.3 mg/dL   GFR calc non Af Amer 8 (L) >60 mL/min   GFR calc Af Amer 9 (L) >60 mL/min   Anion gap 13 5 - 15  CBC  Result Value Ref Range   WBC 6.4 4.0 - 10.5 K/uL   RBC 3.75 (L) 3.87 - 5.11 MIL/uL   Hemoglobin 11.6 (L) 12.0 - 15.0 g/dL   HCT 36.9 36.0 - 46.0 %   MCV 98.4 80.0 - 100.0 fL   MCH 30.9 26.0 - 34.0 pg   MCHC 31.4 30.0 - 36.0 g/dL   RDW 14.6 11.5 - 15.5 %   Platelets 227 150 - 400 K/uL   nRBC 0.0 0.0 - 0.2 %  Heparin level (unfractionated)  Result Value Ref Range   Heparin Unfractionated 0.32 0.30 - 0.70 IU/mL  CBC  Result Value Ref Range   WBC 7.1 4.0 - 10.5 K/uL   RBC 3.84 (L) 3.87 - 5.11 MIL/uL   Hemoglobin 11.8 (L) 12.0 - 15.0 g/dL   HCT 37.7 36.0 - 46.0 %   MCV 98.2 80.0 - 100.0 fL   MCH 30.7 26.0 - 34.0 pg   MCHC  31.3 30.0 - 36.0 g/dL   RDW 14.5 11.5 - 15.5 %   Platelets 237 150 - 400 K/uL   nRBC 0.0 0.0 - 0.2 %  Comprehensive metabolic panel  Result Value Ref Range   Sodium 137 135 - 145 mmol/L   Potassium 4.3 3.5 - 5.1 mmol/L   Chloride 97 (L) 98 - 111 mmol/L   CO2 27 22 - 32 mmol/L   Glucose, Bld 104 (H) 70 - 99 mg/dL   BUN 25 (H) 6 - 20 mg/dL   Creatinine, Ser 6.73 (H) 0.44 - 1.00 mg/dL   Calcium 8.3 (L) 8.9 - 10.3 mg/dL   Total Protein 7.3 6.5 - 8.1 g/dL   Albumin 4.0 3.5 - 5.0 g/dL   AST 24 15 - 41 U/L   ALT 16 0 - 44 U/L   Alkaline Phosphatase 64 38 - 126 U/L  Total Bilirubin 0.5 0.3 - 1.2 mg/dL   GFR calc non Af Amer 7 (L) >60 mL/min   GFR calc Af Amer 8 (L) >60 mL/min   Anion gap 13 5 - 15  Troponin I (High Sensitivity)  Result Value Ref Range   Troponin I (High Sensitivity) 44 (H) <18 ng/L  Troponin I (High Sensitivity)  Result Value Ref Range   Troponin I (High Sensitivity) 331 (HH) <18 ng/L  Troponin I (High Sensitivity)  Result Value Ref Range   Troponin I (High Sensitivity) 1,528 (HH) <18 ng/L  Troponin I (High Sensitivity)  Result Value Ref Range   Troponin I (High Sensitivity) 1,541 (HH) <18 ng/L   Time spent 40 minutes   Murvin Natal, MD [facility]   10/12/2019  6:53 PM How to contact the [facility] Attending or Consulting provider [place] or covering provider during after hours 7P -7A, for this patient?  1. Check the care team in [facility] and look for a) attending/consulting [facility] provider listed and b) the [facility] team listed 2. Log into [email or web address] and use [place]'s universal password to access. If you do not have the password, please contact the hospital operator. 3. Locate the [facility] provider you are looking for under [facility] and page to a number that you can be directly reached. 4. If you still have difficulty reaching the provider, please page the [facility] (Director on Call) for the Hospitalists listed on amion for assistance.

## 2019-10-13 NOTE — Progress Notes (Signed)
*  PRELIMINARY RESULTS* Echocardiogram 2D Echocardiogram has been performed with [facility].  Lori [name] 10/13/2019, 2:25 PM

## 2019-10-14 ENCOUNTER — Encounter (HOSPITAL_COMMUNITY)
Admission: EM | Disposition: A | Discharge status: Home / Self Care | Payer: Self-pay | Source: Home / Self Care | Attending: Internal Medicine

## 2019-10-14 HISTORY — PX: LEFT HEART CATH AND CORONARY ANGIOGRAPHY: CATH118249

## 2019-10-14 LAB — BASIC METABOLIC PANEL
Anion gap: 16 — ABNORMAL HIGH (ref 5–15)
BUN: 40 mg/dL — ABNORMAL HIGH (ref 6–20)
CO2: 24 mmol/L (ref 22–32)
Calcium: 8.2 mg/dL — ABNORMAL LOW (ref 8.9–10.3)
Chloride: 97 mmol/L — ABNORMAL LOW (ref 98–111)
Creatinine, Ser: 9.85 mg/dL — ABNORMAL HIGH (ref 0.44–1.00)
GFR calc Af Amer: 5 mL/min — ABNORMAL LOW (ref >60)
GFR calc non Af Amer: 4 mL/min — ABNORMAL LOW (ref >60)
Glucose, Bld: 93 mg/dL (ref 70–99)
Potassium: 6.3 mmol/L (ref 3.5–5.1)
Sodium: 137 mmol/L (ref 135–145)

## 2019-10-14 LAB — LIPID PANEL
Cholesterol: 219 mg/dL — ABNORMAL HIGH (ref 0–200)
HDL: 37 mg/dL — ABNORMAL LOW (ref >40)
LDL Cholesterol: 161 mg/dL — ABNORMAL HIGH (ref 0–99)
Total CHOL/HDL Ratio: 5.9 RATIO
Triglycerides: 106 mg/dL (ref <150)
VLDL: 21 mg/dL (ref 0–40)

## 2019-10-14 LAB — HEPARIN LEVEL (UNFRACTIONATED)
Heparin Unfractionated: 0.24 IU/mL — ABNORMAL LOW (ref 0.30–0.70)
Heparin Unfractionated: 0.59 IU/mL (ref 0.30–0.70)

## 2019-10-14 LAB — CBC
HCT: 37.8 % (ref 36.0–46.0)
HCT: 35.3 % — ABNORMAL LOW (ref 36.0–46.0)
Hemoglobin: 12 g/dL (ref 12.0–15.0)
Hemoglobin: 11.4 g/dL — ABNORMAL LOW (ref 12.0–15.0)
MCH: 30.7 pg (ref 26.0–34.0)
MCH: 31.1 pg (ref 26.0–34.0)
MCHC: 31.7 g/dL (ref 30.0–36.0)
MCHC: 32.3 g/dL (ref 30.0–36.0)
MCV: 96.7 fL (ref 80.0–100.0)
MCV: 96.2 fL (ref 80.0–100.0)
Platelets: 208 10*3/uL (ref 150–400)
Platelets: 199 10*3/uL (ref 150–400)
RBC: 3.91 MIL/uL (ref 3.87–5.11)
RBC: 3.67 MIL/uL — ABNORMAL LOW (ref 3.87–5.11)
RDW: 14.6 % (ref 11.5–15.5)
RDW: 14.4 % (ref 11.5–15.5)
WBC: 7.6 10*3/uL (ref 4.0–10.5)
WBC: 4.9 10*3/uL (ref 4.0–10.5)
nRBC: 0 % (ref 0.0–0.2)
nRBC: 0 % (ref 0.0–0.2)

## 2019-10-14 LAB — HIV ANTIBODY (ROUTINE TESTING W REFLEX): HIV Screen 4th Generation wRfx: NONREACTIVE — AB

## 2019-10-14 SURGERY — LEFT HEART CATH AND CORONARY ANGIOGRAPHY
Anesthesia: LOCAL

## 2019-10-14 MED ORDER — MIDAZOLAM HCL 2 MG/2ML IJ SOLN
INTRAMUSCULAR | Status: DC | PRN
  Administered 2019-10-14 (×2): 1 mg via INTRAVENOUS

## 2019-10-14 MED ORDER — CALCITRIOL 0.25 MCG PO CAPS
0.7500 ug | ORAL_CAPSULE | ORAL | Status: DC
  Administered 2019-10-14 – 2019-10-21 (×5): 0.75 ug via ORAL
  Filled 2019-10-14 (×4): qty 3

## 2019-10-14 MED ORDER — MUSCLE RUB 10-15 % EX CREA
TOPICAL_CREAM | CUTANEOUS | Status: DC | PRN
  Administered 2019-10-18: 17:00:00 via TOPICAL
  Administered 2019-10-20: 1 via TOPICAL
  Filled 2019-10-14: qty 85

## 2019-10-14 MED ORDER — AMIODARONE HCL 200 MG PO TABS
200.0000 mg | ORAL_TABLET | Freq: Every day | ORAL | Status: DC
  Administered 2019-10-15 – 2019-10-22 (×7): 200 mg via ORAL
  Filled 2019-10-14 (×8): qty 1

## 2019-10-14 MED ORDER — SODIUM CHLORIDE 0.9 % IV SOLN: 250.0000 mL | INTRAVENOUS | Status: DC | PRN

## 2019-10-14 MED ORDER — SODIUM CHLORIDE 0.9% FLUSH
3.0000 mL | Freq: Two times a day (BID) | INTRAVENOUS | Status: DC
  Administered 2019-10-18 – 2019-10-21 (×5): 3 mL via INTRAVENOUS

## 2019-10-14 MED ORDER — HEPARIN (PORCINE) IN NACL 1000-0.9 UT/500ML-% IV SOLN
INTRAVENOUS | Status: AC
  Filled 2019-10-14: qty 1000

## 2019-10-14 MED ORDER — HEPARIN (PORCINE) 25000 UT/250ML-% IV SOLN
1800.0000 [IU]/h | INTRAVENOUS | Status: DC
  Administered 2019-10-14: 1350 [IU]/h via INTRAVENOUS
  Administered 2019-10-15 – 2019-10-18 (×5): 1400 [IU]/h via INTRAVENOUS
  Administered 2019-10-19: 1700 [IU]/h via INTRAVENOUS
  Administered 2019-10-20: 1800 [IU]/h via INTRAVENOUS
  Administered 2019-10-20: 07:00:00 1750 [IU]/h via INTRAVENOUS
  Filled 2019-10-14 (×9): qty 250

## 2019-10-14 MED ORDER — SODIUM CHLORIDE 0.9% FLUSH: 3.0000 mL | INTRAVENOUS | Status: DC | PRN

## 2019-10-14 MED ORDER — MIDAZOLAM HCL 2 MG/2ML IJ SOLN
INTRAMUSCULAR | Status: AC
  Filled 2019-10-14: qty 2

## 2019-10-14 MED ORDER — LIDOCAINE HCL (PF) 1 % IJ SOLN
INTRAMUSCULAR | Status: AC
  Filled 2019-10-14: qty 30

## 2019-10-14 MED ORDER — FENTANYL CITRATE (PF) 100 MCG/2ML IJ SOLN
INTRAMUSCULAR | Status: AC
  Filled 2019-10-14: qty 2

## 2019-10-14 MED ORDER — CYCLOBENZAPRINE HCL 10 MG PO TABS
5.0000 mg | ORAL_TABLET | Freq: Once | ORAL | Status: AC
  Administered 2019-10-14: 23:00:00 5 mg via ORAL
  Filled 2019-10-14: qty 1

## 2019-10-14 MED ORDER — LIDOCAINE HCL (PF) 1 % IJ SOLN
INTRAMUSCULAR | Status: DC | PRN
  Administered 2019-10-14: 24 mg

## 2019-10-14 MED ORDER — IODIXANOL 320 MG/ML IV SOLN
INTRAVENOUS | Status: DC | PRN
  Administered 2019-10-14: 90 mL

## 2019-10-14 MED ORDER — ACETAMINOPHEN 325 MG PO TABS
650.0000 mg | ORAL_TABLET | ORAL | Status: DC | PRN
  Administered 2019-10-18: 08:00:00 650 mg via ORAL

## 2019-10-14 MED ORDER — HEPARIN SODIUM (PORCINE) 5000 UNIT/ML IJ SOLN: 5000.0000 [IU] | Freq: Three times a day (TID) | INTRAMUSCULAR | Status: DC

## 2019-10-14 MED ORDER — FENTANYL CITRATE (PF) 100 MCG/2ML IJ SOLN
INTRAMUSCULAR | Status: DC | PRN
  Administered 2019-10-14 (×2): 25 ug via INTRAVENOUS

## 2019-10-14 MED ORDER — SODIUM CHLORIDE 0.9 % IV SOLN: INTRAVENOUS | Status: DC

## 2019-10-14 MED ORDER — HEPARIN SODIUM (PORCINE) 1000 UNIT/ML IJ SOLN
INTRAMUSCULAR | Status: AC
  Filled 2019-10-14: qty 4

## 2019-10-14 MED ORDER — SODIUM CHLORIDE 0.9% FLUSH: 3.0000 mL | Freq: Two times a day (BID) | INTRAVENOUS | Status: DC

## 2019-10-14 SURGICAL SUPPLY — 11 items
CATH DXT MULTI JL4 JR4 ANG PIG (CATHETERS) ×2 IMPLANT
CLOSURE MYNX CONTROL 5F (Vascular Products) ×2 IMPLANT
KIT HEART LEFT (KITS) ×2 IMPLANT
KIT MICROPUNCTURE NIT STIFF (SHEATH) ×2 IMPLANT
PACK CARDIAC CATHETERIZATION (CUSTOM PROCEDURE TRAY) ×2 IMPLANT
SHEATH PINNACLE 5F 10CM (SHEATH) ×2 IMPLANT
SHEATH PROBE COVER 6X72 (BAG) ×2 IMPLANT
SYR MEDRAD MARK 7 150ML (SYRINGE) ×2 IMPLANT
TRANSDUCER W/STOPCOCK (MISCELLANEOUS) ×2 IMPLANT
TUBING CIL FLEX 10 FLL-RA (TUBING) ×2 IMPLANT
WIRE EMERALD 3MM-J .035X150CM (WIRE) ×2 IMPLANT

## 2019-10-14 NOTE — H&P (View-Only) (Signed)
Subjective:  Patient denies any chest pain or shortness of breath.  He remains in A. Fib with moderate ventricular response.  Scheduled  for left cardiac catheterization possible PCI later today.   Objective:  Vital Signs in the last 24 hours: Temp:  [97.4 F (36.3 C)-98.3 F (36.8 C)] 97.7 F (36.5 C) (11/18 1036) Pulse Rate:  [73-116] 99 (11/18 1036) Resp:  [13-20] 20 (11/18 1036) BP: (99-116)/(58-87) 109/72 (11/18 1036) SpO2:  [99 %-100 %] 100 % (11/18 0800)  Intake/Output from previous day: 11/17 0701 - 11/18 0700 In: 463.6 [P.O.:240; I.V.:223.6] Out: -  Intake/Output from this shift: Total I/O In: 45.9 [I.V.:45.9] Out: -   Physical Exam: Neck: no adenopathy, no JVD, supple, symmetrical, trachea midline and thyroid not enlarged, symmetric, no tenderness/mass/nodules Lungs: clear to auscultation bilaterally Heart: irregularly irregular rhythm, S1, S2 normal and soft systolic murmur noted Abdomen: soft, non-tender; bowel sounds normal; no masses,  no organomegaly Extremities: extremities normal, atraumatic, no cyanosis or edema  Lab Results: Recent Labs    10/13/19 0154 10/14/19 0219  WBC 7.1 7.6  HGB 11.8* 12.0  PLT 237 208   Recent Labs    10/12/19 1925 10/13/19 0154  NA 134* 137  K 3.7 4.3  CL 93* 97*  CO2 28 27  GLUCOSE 109* 104*  BUN 23* 25*  CREATININE 5.95* 6.73*   No results for input(s): TROPONINI in the last 72 hours.  Invalid input(s): CK, MB Hepatic Function Panel Recent Labs    10/13/19 0154  PROT 7.3  ALBUMIN 4.0  AST 24  ALT 16  ALKPHOS 64  BILITOT 0.5   Recent Labs    10/14/19 0219  CHOL 219*   No results for input(s): PROTIME in the last 72 hours.  Imaging: Imaging results have been reviewed and Dg Chest Port 1 View  Result Date: 10/12/2019 CLINICAL DATA:  Chest pain EXAM: PORTABLE CHEST 1 VIEW COMPARISON:  02/02/2019 FINDINGS: Right-sided central venous catheter tip over the right atrium. Clips at the thoracic inlet.  Cardiomegaly without acute airspace disease, pleural effusion or edema. No pneumothorax. Right axillary vascular stents. IMPRESSION: No active disease.  Cardiomegaly. Electronically Signed   By: Donavan Foil M.D.   On: 10/12/2019 20:31    Cardiac Studies:  Assessment/Plan:  Acute non-Q-wave myocardial infarction. Multivessel CAD, status post PCI to LAD and RCA in the past, with significant residual stenosis in the left system. Ischemic cardiomyopathy. New onset A. Fib with RVR. Compensated systolic congestive heart failure. Hypertension Hyperlipidemia Hypothyroidism. Morbid obesity. End-stage renal disease on hemodialysis. History of nephrotic syndrome. Degenerative joint disease. Plan Continue present management. Add low-dose amiodarone Hemodialysis later this afternoon  LOS: 1 day    Charolette Forward 10/14/2019, 11:28 AM

## 2019-10-14 NOTE — Interval H&P Note (Signed)
Cath Lab Visit (complete for each Cath Lab visit)  Clinical Evaluation Leading to the Procedure:   ACS: Yes.    Non-ACS:  n/a   History and Physical Interval Note:  10/14/2019 12:16 PM  Lori [name]  has presented today for surgery, with the diagnosis of chest pain.  The various methods of treatment have been discussed with the patient and family. After consideration of risks, benefits and other options for treatment, the patient has consented to  Procedure(s): LEFT HEART CATH AND CORONARY ANGIOGRAPHY (N/A) as a surgical intervention.  The patient's history has been reviewed, patient examined, no change in status, stable for surgery.  I have reviewed the patient's chart and labs.  Questions were answered to the patient's satisfaction.     Kathlyn [place]

## 2019-10-14 NOTE — Progress Notes (Signed)
PROGRESS NOTE  Lori [name]  DOB: Apr 17, 1971  PCP: Patient, No Pcp Per [id number]  DOA: 10/12/2019  LOS: 1 day   Chief Complaint  Patient presents with  . Chest Pain   Brief narrative: Patient is a 48 y.o. female, with h/o CAD s/p stent to LAD, hypertension, ESRD on HD MWF, hypothyroidism, hyperparathyroidism, GERD.  Patient presented to the hospital with chest pain, which started after she came back from  hemodialysis. She denies being short of breath. She denies nausea, vomiting or diarrhea.  As per patient, chest pain did not improve with nitroglycerin so she took another nitroglycerin and was coming to the ED.  Pain got relieved after she arrived in the ED. Patient's last heart cath was done by Dr. Terrence Dupont in 2019 with placement of drug-eluting stent. In the ED initial troponin was 44, repeat troponin was 331. Dr. Roderic Palau spoke to Dr. Terrence Dupont, who recommended that patient be started on heparin, full dose anticoagulation. She was also found to be in A. fib with RVR.  Subjective: Patient was seen and examined this morning.  Middle-aged female.  Looks older for age.  Not in distress.  This morning, she was waiting for cardiac cath.  Assessment/Plan: Acute coronary syndrome -patient presented with chest pain with elevated troponin.   Underwent cardiac cath today.  Per cath note, patient has severe heavily calcified three-vessel coronary disease, severely tortuous.  PCI options are limited because of multiple lesions, calcification and tortuosity.  Cardiovascular consultation called to see if she is a candidate for CABG.  Brilinta is on hold in the meantime.  Atrial fibrillation -Coreg 3.125 mg for rate control.  Continue heparin drip.  ESRD on hemodialysis, MWF -Underwent dialysis today post cardiac cath.  Hypothyroidism-continue Synthroid  Hypertension-continue Cozaar, Coreg.  Morbid obesity - Body mass index is 40.73 kg/m. Patient has been advised to make an attempt to  improve diet and exercise patterns to aid in weight loss.  Mobility: Encourage ambulation DVT prophylaxis:  Heparin drip Code Status:   Code Status: Full Code  Family Communication:  None Expected Discharge:  Pending CT surgery consultation  Consultants:  Cardiology, nephrology, CT surgery  Procedures:  Cardiac cath today  Antimicrobials: Anti-infectives (From admission, onward)   None      Diet Order            Diet Heart Room service appropriate? Yes with Assist; Fluid consistency: Thin  Diet effective now              Infusions:  . sodium chloride    . sodium chloride    . heparin      Scheduled Meds: . amiodarone  200 mg Oral Daily  . aspirin EC  81 mg Oral Daily  . atorvastatin  80 mg Oral q morning - 10a  . calcitRIOL  0.75 mcg Oral Q M,W,F-HD  . Chlorhexidine Gluconate Cloth  6 each Topical Daily  . heparin      . levothyroxine  300 mcg Oral Q0600  . metoprolol tartrate  25 mg Oral BID  . sevelamer carbonate  3,200 mg Oral TID WC  . sodium chloride flush  3 mL Intravenous Once  . sodium chloride flush  3 mL Intravenous Q12H  . sodium chloride flush  3 mL Intravenous Q12H    PRN meds: sodium chloride, sodium chloride, acetaminophen, HYDROcodone-acetaminophen, nitroGLYCERIN, ondansetron **OR** ondansetron (ZOFRAN) IV, sevelamer carbonate, sodium chloride flush, sodium chloride flush   Objective: Vitals:   10/14/19 1630 10/14/19 1700  BP: (!) 69/56 (!) 88/75  Pulse: 83 100  Resp:    Temp:    SpO2:      Intake/Output Summary (Last 24 hours) at 10/14/2019 1746 Last data filed at 10/14/2019 0900 Gross per 24 hour  Intake 506.5 ml  Output -  Net 506.5 ml   Filed Weights   10/12/19 1908 10/13/19 0151 10/14/19 1340  Weight: 103 kg 102.4 kg 104.3 kg   Weight change:  Body mass index is 40.73 kg/m.   Physical Exam: General exam: Appears calm and comfortable.  Skin: No rashes, lesions or ulcers. HEENT: Atraumatic, normocephalic, supple  neck, no obvious bleeding Lungs: Clear to auscultation bilaterally CVS: Regular rate and rhythm, no murmur GI/Abd soft, nontender, nondistended, bowel sound present CNS: Alert, awake, oriented x3 Psychiatry: Mood appropriate Extremities: No pedal edema, no calf tenderness  Data Review: I have personally reviewed the laboratory data and studies available.  Recent Labs  Lab 10/12/19 1925 10/13/19 0154 10/14/19 0219  WBC 6.4 7.1 7.6  HGB 11.6* 11.8* 12.0  HCT 36.9 37.7 37.8  MCV 98.4 98.2 96.7  PLT 227 237 208   Recent Labs  Lab 10/12/19 1925 10/13/19 0154 10/14/19 1141  NA 134* 137 137  K 3.7 4.3 6.3*  CL 93* 97* 97*  CO2 28 27 24   GLUCOSE 109* 104* 93  BUN 23* 25* 40*  CREATININE 5.95* 6.73* 9.85*  CALCIUM 8.1* 8.3* 8.2*    Terrilee Croak, MD  [facility] 10/14/2019

## 2019-10-14 NOTE — Progress Notes (Signed)
Unable to UF patient due to low bp as documented. Ernest Haber PA made aware orders to run pt. Even remaining tx time.

## 2019-10-14 NOTE — Progress Notes (Signed)
[facility] consulted for CABG evaluation. °

## 2019-10-14 NOTE — Progress Notes (Signed)
Subjective:  Patient denies any chest pain or shortness of breath.  He remains in A. Fib with moderate ventricular response.  Scheduled  for left cardiac catheterization possible PCI later today.   Objective:  Vital Signs in the last 24 hours: Temp:  [97.4 F (36.3 C)-98.3 F (36.8 C)] 97.7 F (36.5 C) (11/18 1036) Pulse Rate:  [73-116] 99 (11/18 1036) Resp:  [13-20] 20 (11/18 1036) BP: (99-116)/(58-87) 109/72 (11/18 1036) SpO2:  [99 %-100 %] 100 % (11/18 0800)  Intake/Output from previous day: 11/17 0701 - 11/18 0700 In: 463.6 [P.O.:240; I.V.:223.6] Out: -  Intake/Output from this shift: Total I/O In: 45.9 [I.V.:45.9] Out: -   Physical Exam: Neck: no adenopathy, no JVD, supple, symmetrical, trachea midline and thyroid not enlarged, symmetric, no tenderness/mass/nodules Lungs: clear to auscultation bilaterally Heart: irregularly irregular rhythm, S1, S2 normal and soft systolic murmur noted Abdomen: soft, non-tender; bowel sounds normal; no masses,  no organomegaly Extremities: extremities normal, atraumatic, no cyanosis or edema  Lab Results: Recent Labs    10/13/19 0154 10/14/19 0219  WBC 7.1 7.6  HGB 11.8* 12.0  PLT 237 208   Recent Labs    10/12/19 1925 10/13/19 0154  NA 134* 137  K 3.7 4.3  CL 93* 97*  CO2 28 27  GLUCOSE 109* 104*  BUN 23* 25*  CREATININE 5.95* 6.73*   No results for input(s): TROPONINI in the last 72 hours.  Invalid input(s): CK, MB Hepatic Function Panel Recent Labs    10/13/19 0154  PROT 7.3  ALBUMIN 4.0  AST 24  ALT 16  ALKPHOS 64  BILITOT 0.5   Recent Labs    10/14/19 0219  CHOL 219*   No results for input(s): PROTIME in the last 72 hours.  Imaging: Imaging results have been reviewed and Dg Chest Port 1 View  Result Date: 10/12/2019 CLINICAL DATA:  Chest pain EXAM: PORTABLE CHEST 1 VIEW COMPARISON:  02/02/2019 FINDINGS: Right-sided central venous catheter tip over the right atrium. Clips at the thoracic inlet.  Cardiomegaly without acute airspace disease, pleural effusion or edema. No pneumothorax. Right axillary vascular stents. IMPRESSION: No active disease.  Cardiomegaly. Electronically Signed   By: Donavan Foil M.D.   On: 10/12/2019 20:31    Cardiac Studies:  Assessment/Plan:  Acute non-Q-wave myocardial infarction. Multivessel CAD, status post PCI to LAD and RCA in the past, with significant residual stenosis in the left system. Ischemic cardiomyopathy. New onset A. Fib with RVR. Compensated systolic congestive heart failure. Hypertension Hyperlipidemia Hypothyroidism. Morbid obesity. End-stage renal disease on hemodialysis. History of nephrotic syndrome. Degenerative joint disease. Plan Continue present management. Add low-dose amiodarone Hemodialysis later this afternoon  LOS: 1 day    Charolette Forward 10/14/2019, 11:28 AM

## 2019-10-14 NOTE — Progress Notes (Addendum)
[place] for heparin Indication: atrial fibrillation  Allergies  Allergen Reactions  . Activase [Alteplase] Shortness Of Breath  . Bee Pollen Anaphylaxis  . Warfarin Sodium Nausea And Vomiting and Rash    Patient Measurements: Height: 5\' 3"  (160 cm) Weight: 225 lb 12 oz (102.4 kg) IBW/kg (Calculated) : 52.4 Heparin Dosing Weight: 77 kg  Vital Signs: Temp: 97.7 F (36.5 C) (11/18 1036) Temp Source: Oral (11/18 1036) BP: 110/70 (11/18 1258) Pulse Rate: 108 (11/18 1258)  Labs: Recent Labs    10/12/19 1925 10/12/19 2119 10/13/19 0154 10/13/19 0408  10/13/19 1534 10/14/19 0219 10/14/19 1141  HGB 11.6*  --  11.8*  --   --   --  12.0  --   HCT 36.9  --  37.7  --   --   --  37.8  --   PLT 227  --  237  --   --   --  208  --   HEPARINUNFRC  --   --   --   --    < > 0.11* 0.24* 0.59  CREATININE 5.95*  --  6.73*  --   --   --   --  9.85*  TROPONINIHS 44* 331* 1,528* 1,541*  --   --   --   --    < > = values in this interval not displayed.    Estimated Creatinine Clearance: 7.9 mL/min (A) (by C-G formula based on SCr of 9.85 mg/dL (H)).   Assessment: Pharmacy consult for patient with ACS/atrial fibrillation. Patient has elevated troponin with 1541. Patient is not on anticoagulation prior to admission.  Last Heparin level therapeutic at 0.59 on 1350 units/hr. Will restart Heparin 8 hours after sheath removal ~ 2100 - Nurse is aware   Goal of Therapy:  Heparin level 0.3-0.7 units/ml Monitor platelets by anticoagulation protocol: Yes   Plan:  No heparin bolus Restart heparin 1350 units/hr at 2100 (8 hours post-sheath removal)  Daily HL and CBC Monitor s/sx of bleeding F/u oral AC at discharge   Lorel Monaco, PharmD PGY1 [place] Resident [employer] # [phone]

## 2019-10-14 NOTE — Progress Notes (Signed)
[place] for heparin Indication: ACS/afib  Allergies  Allergen Reactions  . Activase [Alteplase] Shortness Of Breath  . Bee Pollen Anaphylaxis  . Warfarin Sodium Nausea And Vomiting and Rash    Patient Measurements: Height: 5\' 3"  (160 cm) Weight: 225 lb 12 oz (102.4 kg) IBW/kg (Calculated) : 52.4 Heparin Dosing Weight: 77 kg  Vital Signs: Temp: 97.4 F (36.3 C) (11/17 1900) Temp Source: Oral (11/17 1900) BP: 116/74 (11/18 0007) Pulse Rate: 116 (11/17 2120)  Labs: Recent Labs    10/12/19 1925 10/12/19 2119 10/13/19 0154 10/13/19 0408 10/13/19 0740 10/13/19 1534 10/14/19 0219  HGB 11.6*  --  11.8*  --   --   --  12.0  HCT 36.9  --  37.7  --   --   --  37.8  PLT 227  --  237  --   --   --  208  HEPARINUNFRC  --   --   --   --  0.32 0.11* 0.24*  CREATININE 5.95*  --  6.73*  --   --   --   --   TROPONINIHS 44* 331* 1,528* 1,541*  --   --   --     Estimated Creatinine Clearance: 11.6 mL/min (A) (by C-G formula based on SCr of 6.73 mg/dL (H)).   Assessment: Pharmacy consult for patient with ACS/atrial fibrillation.  Patient has elevated troponin with 1541.  Patient is not on anticoagulation prior to admission.  Heparin level subtherapeutic (0.24) on gtt at 1200 units/hr. No issues with line or bleeding reported per RN.  Goal of Therapy:  Heparin level 0.3-0.7 units/ml Monitor platelets by anticoagulation protocol: Yes   Plan:  Increase heparin to 1350 units/hr Will f/u 8 hr heparin level  Sherlon Handing, PharmD, BCPS Please see amion for complete clinical pharmacist phone list 10/14/2019 3:54 AM

## 2019-10-14 NOTE — Plan of Care (Signed)

## 2019-10-14 NOTE — Procedures (Signed)
Patient was seen on dialysis and the procedure was supervised.  BFR 400  Via TDC BP is  95/49.   Patient appears to be tolerating treatment well  Louis Meckel 10/14/2019

## 2019-10-14 NOTE — Consult Note (Addendum)
[facility] Renal Consultation Note  Indication for Consultation:  Management of ESRD/hemodialysis; anemia, hypertension/volume and secondary hyperparathyroidism  HPI: Lori [name] is a 48 y.o. 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   She presented to [facility] hospital 10/12/19 evening with Chest pain ,  new onset  A FiB with RVR  ,and elevated high sensitivity troponin I. She was transferred to [facility]  For card cath eval .  Her last OP  HD was on schedule  10/12/19  With 4 .0 l uf (about her normal uf was 5kg.edw) .She  currently is being taken to Card cath lab for eval. She denies any sob or Chest pain .   Seen on HD-  Hand is numb- no CP. Cath showed multivessel disease-  Consulting with CTS      Past Medical History:  Diagnosis Date  . Anemia   . ASCVD (arteriosclerotic cardiovascular disease)    a. s/p prior stenting of LAD b. NSTEMI in 01/2018 requiring DES x3 to RCA given spiral dissection from ostium to mid-RCA but residual disease along distal RCA, LAD and 2nd Mrg  . Calciphylaxis   . Chronic abdominal wound infection   . Dialysis patient ([place])   . ESRD (end stage renal disease) ([place])    Due to membranous GN dialysis 09/1996; peritoneal dialysis --? peitonitis; difficult vascular access  . GERD (gastroesophageal reflux disease)   . Hashimoto thyroiditis   . Hyperparathyroidism   . Hypertension   . Hypothyroidism   . Medically noncompliant   . Morbid obesity ([place])     Past Surgical History:  Procedure Laterality Date  . [place]  . LEFT HEART CATH AND CORONARY ANGIOGRAPHY N/A  02/11/2018   Procedure: LEFT HEART CATH AND CORONARY ANGIOGRAPHY;  Surgeon: Charolette Forward, MD;  Location: [facility];  Service: Cardiovascular;  Laterality: N/A;  . THYROIDECTOMY, PARTIAL     Resectin of left lobe with reimplantation in the forearm, small focus of papillary carcinoma incidentally noted at pathology - 2000 and Hashimoto's thyrdoiditis in 2001  . TONSILLECTOMY AND ADENOIDECTOMY        Family History  Problem Relation Age of Onset  . Coronary artery disease Mother   . Kidney disease Father   . Diabetes Sister       reports that she has quit smoking. She has never used smokeless tobacco. She reports that she does not drink alcohol or use drugs.   Allergies  Allergen Reactions  . Activase [Alteplase] Shortness Of Breath  . Bee Pollen Anaphylaxis  . Warfarin Sodium Nausea And Vomiting and Rash    Prior to Admission medications   Medication Sig Start Date End Date Taking? Authorizing Provider  albuterol (PROVENTIL HFA;VENTOLIN HFA) 108 (90 BASE) MCG/ACT inhaler Inhale 2 puffs into the lungs every 6 (six) hours as needed. Shortness of breath   Yes [provider]  atorvastatin (LIPITOR) 80 MG tablet Take 1 tablet (80 mg total) by mouth daily at 6 PM. Patient taking differently: Take 80 mg by mouth every morning.  02/14/18  Yes Charolette Forward, MD  b complex-vitamin c-folic acid (NEPHRO-VITE) 0.8  MG TABS tablet Take 1 tablet by mouth daily.   Yes [provider]  carvedilol (COREG) 3.125 MG tablet Take 3.125 mg by mouth 2 (two) times daily with a meal.  07/28/19  Yes [provider]  HYDROcodone-acetaminophen (NORCO/VICODIN) 5-325 MG tablet One tablet every six hours for pain.  Limit 7 days. Patient taking differently: Take 1 tablet by mouth every 6 (six) hours as needed for moderate pain (knee pain).  10/08/19  Yes Sanjuana Kava, MD  ibuprofen (ADVIL) 200 MG tablet Take 1,000 mg by mouth every morning. *May take additional dose in the evening  as needed for pain   Yes [provider]  levothyroxine (SYNTHROID, LEVOTHROID) 300 MCG tablet Take 300 mcg by mouth at bedtime. SYNTHROID ONLY-BRAND NAME MEDICALLY NECESSARY   Yes [provider]  losartan (COZAAR) 25 MG tablet Take 0.5 tablets (12.5 mg total) by mouth daily. 02/15/18  Yes Charolette Forward, MD  nitroGLYCERIN (NITROSTAT) 0.4 MG SL tablet Place 1 tablet (0.4 mg total) under the tongue every 5 (five) minutes x 3 doses as needed for chest pain. 02/14/18  Yes Charolette Forward, MD  sevelamer carbonate (RENVELA) 800 MG tablet Take 1,600-3,200 mg by mouth 3 (three) times daily with meals. 3200mg  with meals and 1600mg  with snacks   Yes [provider]  ticagrelor (BRILINTA) 90 MG TABS tablet Take 1 tablet (90 mg total) by mouth 2 (two) times daily. 02/14/18  Yes Charolette Forward, MD  EPINEPHrine 0.3 mg/0.3 mL IJ SOAJ injection Inject 0.3 mg into the muscle as needed.    [provider]     Anti-infectives (From admission, onward)   None      Results for orders placed or performed during the hospital encounter of 10/12/19 (from the past 48 hour(s))  Basic metabolic panel     Status: Abnormal   Collection Time: 10/12/19  7:25 PM  Result Value Ref Range   Sodium 134 (L) 135 - 145 mmol/L   Potassium 3.7 3.5 - 5.1 mmol/L   Chloride 93 (L) 98 - 111 mmol/L   CO2 28 22 - 32 mmol/L   Glucose, Bld 109 (H) 70 - 99 mg/dL   BUN 23 (H) 6 - 20 mg/dL   Creatinine, Ser 5.95 (H) 0.44 - 1.00 mg/dL   Calcium 8.1 (L) 8.9 - 10.3 mg/dL   GFR calc non Af Amer 8 (L) >60 mL/min   GFR calc Af Amer 9 (L) >60 mL/min   Anion gap 13 5 - 15    Comment: Performed at [facility], [address], [place], [place] [address]  CBC     Status: Abnormal   Collection Time: 10/12/19  7:25 PM  Result Value Ref Range   WBC 6.4 4.0 - 10.5 K/uL   RBC 3.75 (L) 3.87 - 5.11 MIL/uL   Hemoglobin 11.6 (L) 12.0 - 15.0 g/dL   HCT 36.9 36.0 - 46.0 %   MCV 98.4 80.0 - 100.0 fL   MCH 30.9 26.0 -  34.0 pg   MCHC 31.4 30.0 - 36.0 g/dL   RDW 14.6 11.5 - 15.5 %   Platelets 227 150 - 400 K/uL   nRBC 0.0 0.0 - 0.2 %    Comment: Performed at [facility], [address], [place], [place] [address]  Troponin I (High Sensitivity)     Status: Abnormal   Collection Time: 10/12/19  7:25 PM  Result Value Ref Range   Troponin I (High Sensitivity) 44 (H) <18 ng/L  Comment: (NOTE) Elevated high sensitivity troponin I (hsTnI) values and significant  changes across serial measurements may suggest ACS but many other  chronic and acute conditions are known to elevate hsTnI results.  Refer to the "Links" section for chest pain algorithms and additional  guidance. Performed at [facility], [address], [place], [place] [address]   Troponin I (High Sensitivity)     Status: Abnormal   Collection Time: 10/12/19  9:19 PM  Result Value Ref Range   Troponin I (High Sensitivity) 331 (HH) <18 ng/L    Comment: CRITICAL RESULT CALLED TO, READ BACK BY AND VERIFIED WITH: ELLIS,C AT 2208 ON 11.16.20 BY ISLEY,B (NOTE) Elevated high sensitivity troponin I (hsTnI) values and significant  changes across serial measurements may suggest ACS but many other  chronic and acute conditions are known to elevate hsTnI results.  Refer to the Links section for chest pain algorithms and additional  guidance. Performed at [facility], [address], [place], [place] [address]   SARS CORONAVIRUS 2 (TAT 6-24 HRS) Nasopharyngeal Nasopharyngeal Swab     Status: None   Collection Time: 10/12/19 10:37 PM   Specimen: Nasopharyngeal Swab  Result Value Ref Range   SARS Coronavirus 2 NEGATIVE NEGATIVE    Comment: (NOTE) SARS-CoV-2 target nucleic acids are NOT DETECTED. The SARS-CoV-2 RNA is generally detectable in upper and lower respiratory specimens during the acute phase of infection. Negative results do not preclude SARS-CoV-2 infection, do not rule out co-infections with other pathogens, and should not be used as  the sole basis for treatment or other patient management decisions. Negative results must be combined with clinical observations, patient history, and epidemiological information. The expected result is Negative. Fact Sheet for Patients: SugarRoll.be Fact Sheet for Healthcare Providers: https://www.woods-mathews.com/ This test is not yet approved or cleared by the [place] FDA and  has been authorized for detection and/or diagnosis of SARS-CoV-2 by FDA under an Emergency Use Authorization (EUA). This EUA will remain  in effect (meaning this test can be used) for the duration of the COVID-19 declaration under Section 56 4(b)(1) of the Act, 21 U.S.C. section 360bbb-3(b)(1), unless the authorization is terminated or revoked sooner. Performed at [facility], [place] [address] [place], [place] [address]   MRSA PCR Screening     Status: None   Collection Time: 10/13/19  1:30 AM   Specimen: Nasal Mucosa; Nasopharyngeal  Result Value Ref Range   MRSA by PCR NEGATIVE NEGATIVE    Comment:        The GeneXpert MRSA Assay (FDA approved for NASAL specimens only), is one component of a comprehensive MRSA colonization surveillance program. It is not intended to diagnose MRSA infection nor to guide or monitor treatment for MRSA infections. Performed at [facility], [address] [place], [place] [address]   HIV Antibody (routine testing w rflx)     Status: Abnormal   Collection Time: 10/13/19  1:54 AM  Result Value Ref Range   HIV Screen 4th Generation wRfx Non Reactive (A) Non Reactive    Comment: (NOTE) Performed At: [facility] [place], [place] [id number] Rush Farmer MD [id number]   CBC     Status: Abnormal   Collection Time: 10/13/19  1:54 AM  Result Value Ref Range   WBC 7.1 4.0 - 10.5 K/uL   RBC 3.84 (L) 3.87 - 5.11 MIL/uL   Hemoglobin 11.8 (L) 12.0 - 15.0 g/dL   HCT 37.7 36.0 - 46.0 %  MCV  98.2 80.0 - 100.0 fL   MCH 30.7 26.0 - 34.0 pg   MCHC 31.3 30.0 - 36.0 g/dL   RDW 14.5 11.5 - 15.5 %   Platelets 237 150 - 400 K/uL   nRBC 0.0 0.0 - 0.2 %    Comment: Performed at [facility], [address] [place], [place] [address]  Comprehensive metabolic panel     Status: Abnormal   Collection Time: 10/13/19  1:54 AM  Result Value Ref Range   Sodium 137 135 - 145 mmol/L   Potassium 4.3 3.5 - 5.1 mmol/L   Chloride 97 (L) 98 - 111 mmol/L   CO2 27 22 - 32 mmol/L   Glucose, Bld 104 (H) 70 - 99 mg/dL   BUN 25 (H) 6 - 20 mg/dL   Creatinine, Ser 6.73 (H) 0.44 - 1.00 mg/dL   Calcium 8.3 (L) 8.9 - 10.3 mg/dL   Total Protein 7.3 6.5 - 8.1 g/dL   Albumin 4.0 3.5 - 5.0 g/dL   AST 24 15 - 41 U/L   ALT 16 0 - 44 U/L   Alkaline Phosphatase 64 38 - 126 U/L   Total Bilirubin 0.5 0.3 - 1.2 mg/dL   GFR calc non Af Amer 7 (L) >60 mL/min   GFR calc Af Amer 8 (L) >60 mL/min   Anion gap 13 5 - 15    Comment: Performed at [facility], [address] [place], [place] [address]  Troponin I (High Sensitivity)     Status: Abnormal   Collection Time: 10/13/19  1:54 AM  Result Value Ref Range   Troponin I (High Sensitivity) 1,528 (HH) <18 ng/L    Comment: CRITICAL RESULT CALLED TO, READ BACK BY AND VERIFIED WITH: S WADE,RN @0253  10/13/19 MKELLY (NOTE) Elevated high sensitivity troponin I (hsTnI) values and significant  changes across serial measurements may suggest ACS but many other  chronic and acute conditions are known to elevate hsTnI results.  Refer to the Links section for chest pain algorithms and additional  guidance. Performed at [facility], [address], [place], [place] [address]   Troponin I (High Sensitivity)     Status: Abnormal   Collection Time: 10/13/19  4:08 AM  Result Value Ref Range   Troponin I (High Sensitivity) 1,541 (HH) <18 ng/L    Comment: CRITICAL RESULT CALLED TO, READ BACK BY AND VERIFIED WITH: WADE,S AT 5:30AM ON 10/13/19 BY FESTERMAN,C (NOTE) Elevated high  sensitivity troponin I (hsTnI) values and significant  changes across serial measurements may suggest ACS but many other  chronic and acute conditions are known to elevate hsTnI results.  Refer to the Links section for chest pain algorithms and additional  guidance. Performed at [facility], [address], [place], [place] [address]   Heparin level (unfractionated)     Status: None   Collection Time: 10/13/19  7:40 AM  Result Value Ref Range   Heparin Unfractionated 0.32 0.30 - 0.70 IU/mL    Comment: (NOTE) If heparin results are below expected values, and patient dosage has  been confirmed, suggest follow up testing of antithrombin III levels. Performed at [facility], [address], [place], [place] [address]   Heparin level (unfractionated)     Status: Abnormal   Collection Time: 10/13/19  3:34 PM  Result Value Ref Range   Heparin Unfractionated 0.11 (L) 0.30 - 0.70 IU/mL    Comment: (NOTE) If heparin results are below expected values, and patient dosage has  been confirmed, suggest  follow up testing of antithrombin III levels. Performed at [facility], [place] [address], [place], [place] [address]   CBC     Status: None   Collection Time: 10/14/19  2:19 AM  Result Value Ref Range   WBC 7.6 4.0 - 10.5 K/uL   RBC 3.91 3.87 - 5.11 MIL/uL   Hemoglobin 12.0 12.0 - 15.0 g/dL   HCT 37.8 36.0 - 46.0 %   MCV 96.7 80.0 - 100.0 fL   MCH 30.7 26.0 - 34.0 pg   MCHC 31.7 30.0 - 36.0 g/dL   RDW 14.6 11.5 - 15.5 %   Platelets 208 150 - 400 K/uL   nRBC 0.0 0.0 - 0.2 %    Comment: Performed at [facility], [place] [address] [place], [place] [address]  Lipid panel     Status: Abnormal   Collection Time: 10/14/19  2:19 AM  Result Value Ref Range   Cholesterol 219 (H) 0 - 200 mg/dL   Triglycerides 106 <150 mg/dL   HDL 37 (L) >40 mg/dL   Total CHOL/HDL Ratio 5.9 RATIO   VLDL 21 0 - 40 mg/dL   LDL Cholesterol 161 (H) 0 - 99 mg/dL    Comment:        Total Cholesterol/HDL:CHD  Risk Coronary Heart Disease Risk Table                     Men   Women  1/2 Average Risk   3.4   3.3  Average Risk       5.0   4.4  2 X Average Risk   9.6   7.1  3 X Average Risk  23.4   11.0        Use the calculated Patient Ratio above and the CHD Risk Table to determine the patient's CHD Risk.        ATP III CLASSIFICATION (LDL):  <100     mg/dL   Optimal  100-129  mg/dL   Near or Above                    Optimal  130-159  mg/dL   Borderline  160-189  mg/dL   High  >190     mg/dL   Very High Performed at [place] [address], [place], [place] [address]   Heparin level (unfractionated)     Status: Abnormal   Collection Time: 10/14/19  2:19 AM  Result Value Ref Range   Heparin Unfractionated 0.24 (L) 0.30 - 0.70 IU/mL    Comment: (NOTE) If heparin results are below expected values, and patient dosage has  been confirmed, suggest follow up testing of antithrombin III levels. Performed at [facility], [place] [address] [place], [place] [address]      ROS: see hpi for positives   Physical Exam: Vitals:   10/14/19 0422 10/14/19 0739  BP: 112/87 (!) 99/58  Pulse:  (!) 106  Resp: 13 19  Temp: (!) 97.5 F (36.4 C) 97.6 F (36.4 C)  SpO2: 100% 100%     General: alert obese chronically ill WF NAD ,appropriate  HEENT: [place] ,eomi, not icteric, MMM  Neck: no jvd Heart: Irre, irreg  HR stable , no m, r,g Lungs: CTA non labored breathing Abdomen: obese, bs +, soft NT,ND Extremities: no pedal edema Skin: scattered old lower extremity excoriates/ no overt rash   Neuro: alert oX3, no acute focal deficits appreciated  Dialysis Access: Merwyn Katos cath   Dialysis Orders: : Joneen Caraway [facility] on MWF . EDW 102kg HD Bath 2k, 2.5ca  Time 4hr Heparin 10,000. Access R IJ Perm cath (P cath Dependant )     Calcitriol 0.46mcg  Po q hd Mirciera 50 mcg iv q 4 wks  ( last on 09/23/19)  Op hgb 10.5 (10/07/19 )   Assessment/Plan 1. Acute NonQ wave MI ( ho Multivessel  CAD sp  LAD/ RCA  PCI ibn past ) - wu per Card , currently on way to cath lab.  Has multivessel disease - to have CTS eval 2. New onset A Fib with RVR - per cards 3. ESRD - HD MWF  Plan HD after Card cath . 4. Hypertension/volume  - bp currently stable / per wts at  102.4 kg  =claose to edw  CXR 11/16 =NAD, CM 5. Anemia of ESRD= HGB 12.0 no current esa ned fu trend,, 6. Metabolic bone disease -  Po vit d on hd and binder( renvela)  with meals Ca 8.2  7. Hypothyroidism - HO  Chronic noncompliance with Levothyroxine in past   Ernest Haber, PA-C [place] [phone] 10/14/2019, 8:01 AM   Patient seen and examined, agree with above note with above modifications. Longstanding HD patient- Admitted with CP-- unfortunately cardiac cath shows much disease-  For CTS referral.  Seen in HD at present - no CP but hand is numb with what appears to be an ulcer on her finger.  Cont maintenance HD on MWF via [place], MD 10/14/2019

## 2019-10-15 ENCOUNTER — Encounter (HOSPITAL_COMMUNITY): Payer: Self-pay | Admitting: Cardiovascular Disease

## 2019-10-15 ENCOUNTER — Inpatient Hospital Stay (HOSPITAL_COMMUNITY): Payer: Medicare Other

## 2019-10-15 DIAGNOSIS — I214 Non-ST elevation (NSTEMI) myocardial infarction: Secondary | ICD-10-CM

## 2019-10-15 DIAGNOSIS — I25110 Atherosclerotic heart disease of native coronary artery with unstable angina pectoris: Secondary | ICD-10-CM

## 2019-10-15 DIAGNOSIS — I2510 Atherosclerotic heart disease of native coronary artery without angina pectoris: Secondary | ICD-10-CM

## 2019-10-15 DIAGNOSIS — Z992 Dependence on renal dialysis: Secondary | ICD-10-CM

## 2019-10-15 DIAGNOSIS — N186 End stage renal disease: Secondary | ICD-10-CM

## 2019-10-15 LAB — BASIC METABOLIC PANEL
Anion gap: 14 (ref 5–15)
BUN: 17 mg/dL (ref 6–20)
CO2: 24 mmol/L (ref 22–32)
Calcium: 8.1 mg/dL — ABNORMAL LOW (ref 8.9–10.3)
Chloride: 97 mmol/L — ABNORMAL LOW (ref 98–111)
Creatinine, Ser: 5.56 mg/dL — ABNORMAL HIGH (ref 0.44–1.00)
GFR calc Af Amer: 10 mL/min — ABNORMAL LOW (ref >60)
GFR calc non Af Amer: 8 mL/min — ABNORMAL LOW (ref >60)
Glucose, Bld: 79 mg/dL (ref 70–99)
Potassium: 4.7 mmol/L (ref 3.5–5.1)
Sodium: 135 mmol/L (ref 135–145)

## 2019-10-15 LAB — CBC
HCT: 34.2 % — ABNORMAL LOW (ref 36.0–46.0)
Hemoglobin: 11 g/dL — ABNORMAL LOW (ref 12.0–15.0)
MCH: 31.1 pg (ref 26.0–34.0)
MCHC: 32.2 g/dL (ref 30.0–36.0)
MCV: 96.6 fL (ref 80.0–100.0)
Platelets: 183 10*3/uL (ref 150–400)
RBC: 3.54 MIL/uL — ABNORMAL LOW (ref 3.87–5.11)
RDW: 14.4 % (ref 11.5–15.5)
WBC: 6.1 10*3/uL (ref 4.0–10.5)
nRBC: 0 % (ref 0.0–0.2)

## 2019-10-15 LAB — HEMOGLOBIN A1C
Hgb A1c MFr Bld: 5.1 % (ref 4.8–5.6)
Mean Plasma Glucose: 99.67 mg/dL

## 2019-10-15 LAB — MAGNESIUM: Magnesium: 1.8 mg/dL (ref 1.7–2.4)

## 2019-10-15 LAB — HEPARIN LEVEL (UNFRACTIONATED)
Heparin Unfractionated: 0.15 IU/mL — ABNORMAL LOW (ref 0.30–0.70)
Heparin Unfractionated: 0.7 IU/mL (ref 0.30–0.70)

## 2019-10-15 MED ORDER — LEVOTHYROXINE SODIUM 100 MCG PO TABS
300.0000 ug | ORAL_TABLET | Freq: Every day | ORAL | Status: DC
  Administered 2019-10-15 – 2019-10-21 (×7): 300 ug via ORAL
  Filled 2019-10-15 (×7): qty 3

## 2019-10-15 MED ORDER — CHLORHEXIDINE GLUCONATE CLOTH 2 % EX PADS
6.0000 | MEDICATED_PAD | Freq: Every day | CUTANEOUS | Status: DC
  Administered 2019-10-16: 6 via TOPICAL

## 2019-10-15 MED ORDER — SODIUM CHLORIDE 0.9 % IV BOLUS: 250.0000 mL | Freq: Once | INTRAVENOUS | Status: DC

## 2019-10-15 MED FILL — Heparin Sod (Porcine)-NaCl IV Soln 1000 Unit/500ML-0.9%: INTRAVENOUS | Qty: 1000 | Status: AC

## 2019-10-15 NOTE — Plan of Care (Signed)

## 2019-10-15 NOTE — Progress Notes (Signed)
[place] for heparin Indication: atrial fibrillation  Allergies  Allergen Reactions  . Activase [Alteplase] Shortness Of Breath  . Bee Pollen Anaphylaxis  . Warfarin Sodium Nausea And Vomiting and Rash    Patient Measurements: Height: 5\' 3"  (160 cm) Weight: 230 lb 2.6 oz (104.4 kg) IBW/kg (Calculated) : 52.4 Heparin Dosing Weight: 77 kg  Vital Signs: Temp: 97.5 F (36.4 C) (11/19 0314) Temp Source: Oral (11/19 0314) BP: 97/66 (11/19 0314) Pulse Rate: 101 (11/19 0314)  Labs: Recent Labs    10/12/19 2119 10/13/19 0154 10/13/19 0408  10/14/19 0219 10/14/19 1141 10/14/19 1920 10/15/19 0230  HGB  --  11.8*  --   --  12.0  --  11.4* 11.0*  HCT  --  37.7  --   --  37.8  --  35.3* 34.2*  PLT  --  237  --   --  208  --  199 183  HEPARINUNFRC  --   --   --    < > 0.24* 0.59  --  0.15*  CREATININE  --  6.73*  --   --   --  9.85* 4.17* 5.56*  TROPONINIHS 331* 1,528* 1,541*  --   --   --   --   --    < > = values in this interval not displayed.    Estimated Creatinine Clearance: 14.1 mL/min (A) (by C-G formula based on SCr of 5.56 mg/dL (H)).  Assessment: 48 y.o. female with Afib, s/p catha and awaiting CVTS consult, for heparin   Goal of Therapy:  Heparin level 0.3-0.7 units/ml Monitor platelets by anticoagulation protocol: Yes   Plan:  Increase Heparin 1450 units/hr Check heparin level in 8 hours.  Phillis Knack, PharmD, BCPS

## 2019-10-15 NOTE — Progress Notes (Signed)
PROGRESS NOTE  Lori [name]  DOB: May 20, 1971  PCP: Patient, No Pcp Per MU:[id number]  DOA: 10/12/2019  LOS: 2 days   Chief Complaint  Patient presents with  . Chest Pain   Brief narrative: Patient is a 48 y.o. female, with h/o CAD s/p stent to LAD, hypertension, ESRD on HD MWF, hypothyroidism, hyperparathyroidism, GERD.  Patient presented to the hospital on 11/16 with chest pain, which started after she came back from  hemodialysis. Chest pain did not improve with nitroglycerin and hence came to the ED. Patient's last heart cath was done by Dr. Terrence [name] in 2019 with placement of drug-eluting stent in LAD. In the ED initial troponin was 44, repeat troponin was 331. Dr. Roderic [name] spoke to Dr. Terrence [name], who recommended that patient be started on heparin, full dose anticoagulation. She was also found to be in A. fib with RVR. Patient was admitted to hospitalist medicine service.  Cardiology consultation was called. 11/18, patient underwent cardiac catheterization.  Per cardiac cath note, patient has severe heavily calcified three-vessel coronary artery disease, severely tortuous. PCI options are limited because of multiple lesions, calcification and tortuosity.  Cardiovascular consultation called to see if she is a candidate for CABG.  Subjective: Patient was seen and examined this morning.  Propped up in bed.  Not in distress.  No new symptoms.  No chest pain this morning. Seen by CT surgery earlier.  Assessment/Plan: Acute coronary syndrome Multivessel coronary disease s/p PCI to LAD and RCA in the past Ischemic cardiomyopathy -LVEF 50% -patient presented with chest pain with elevated troponin.   -11/18, cardiac cath.  Multivessel disease.  Cardiovascular surgery consultation was obtained.  Formal note pending.  Per patient, plan is to obtain a venous mapping of her lower extremities today.Lori [name] is on hold in the meantime.  Atrial fibrillation -Coreg 3.125 mg for rate control.   Continue heparin drip.  ESRD on hemodialysis, MWF -Underwent dialysis post cardiac cath on 11/18  Hypothyroidism-continue Synthroid  Hypertension-continue Cozaar, Coreg.  Morbid obesity - Body mass index is 40.77 kg/m. Patient has been advised to make an attempt to improve diet and exercise patterns to aid in weight loss.  Mobility: Encourage ambulation DVT prophylaxis:  Heparin drip Code Status:   Code Status: Full Code  Family Communication:  None Expected Discharge:  Pending CT surgery consultation  Consultants:  Cardiology, nephrology, CT surgery  Procedures:  Cardiac cath 11/18  Antimicrobials: Anti-infectives (From admission, onward)   None      Diet Order            Diet Heart Room service appropriate? Yes with Assist; Fluid consistency: Thin  Diet effective now              Infusions:  . sodium chloride    . sodium chloride    . heparin 1,450 Units/hr (10/15/19 0600)    Scheduled Meds: . amiodarone  200 mg Oral Daily  . aspirin EC  81 mg Oral Daily  . atorvastatin  80 mg Oral q morning - 10a  . calcitRIOL  0.75 mcg Oral Q M,W,F-HD  . Chlorhexidine Gluconate Cloth  6 each Topical Daily  . levothyroxine  300 mcg Oral Q0600  . metoprolol tartrate  25 mg Oral BID  . sevelamer carbonate  3,200 mg Oral TID WC  . sodium chloride flush  3 mL Intravenous Once  . sodium chloride flush  3 mL Intravenous Q12H  . sodium chloride flush  3 mL Intravenous Q12H    PRN  meds: sodium chloride, sodium chloride, acetaminophen, HYDROcodone-acetaminophen, Muscle Rub, nitroGLYCERIN, ondansetron **OR** ondansetron (ZOFRAN) IV, sevelamer carbonate, sodium chloride flush, sodium chloride flush   Objective: Vitals:   10/14/19 2357 10/15/19 0314  BP: 94/79 97/66  Pulse:  (!) 101  Resp:  (!) 21  Temp: 97.7 F (36.5 C) (!) 97.5 F (36.4 C)  SpO2: 97% 100%    Intake/Output Summary (Last 24 hours) at 10/15/2019 0741 Last data filed at 10/15/2019 0600 Gross per 24  hour  Intake 159.98 ml  Output -427 ml  Net 586.98 ml   Filed Weights   10/13/19 0151 10/14/19 1340 10/14/19 1749  Weight: 102.4 kg 104.3 kg 104.4 kg   Weight change:  Body mass index is 40.77 kg/m.   Physical Exam:  General exam: Appears calm and comfortable.  Skin: No rashes, lesions or ulcers.  Generalized skin dryness. HEENT: Atraumatic, normocephalic, supple neck, no obvious bleeding Lungs: Clear to auscultation bilaterally CVS: Regular rate and rhythm, no murmur GI/Abd soft, nontender, nondistended, bowel sound present CNS: Alert, awake, oriented x3 Psychiatry: Mood appropriate Extremities: No pedal edema, no calf tenderness  Data Review: I have personally reviewed the laboratory data and studies available.  Recent Labs  Lab 10/12/19 1925 10/13/19 0154 10/14/19 0219 10/14/19 1920 10/15/19 0230  WBC 6.4 7.1 7.6 4.9 6.1  HGB 11.6* 11.8* 12.0 11.4* 11.0*  HCT 36.9 37.7 37.8 35.3* 34.2*  MCV 98.4 98.2 96.7 96.2 96.6  PLT 227 237 208 199 183   Recent Labs  Lab 10/12/19 1925 10/13/19 0154 10/14/19 1141 10/14/19 1920 10/15/19 0230  NA 134* 137 137  --  135  K 3.7 4.3 6.3*  --  4.7  CL 93* 97* 97*  --  97*  CO2 28 27 24   --  24  GLUCOSE 109* 104* 93  --  79  BUN 23* 25* 40*  --  17  CREATININE 5.95* 6.73* 9.85* 4.17* 5.56*  CALCIUM 8.1* 8.3* 8.2*  --  8.1*  MG  --   --   --   --  1.8    Lori Croak, MD  [facility] 10/15/2019

## 2019-10-15 NOTE — Plan of Care (Signed)

## 2019-10-15 NOTE — Progress Notes (Signed)
Subjective:  Patient denies any chest pain or shortness of breath.  Underwent left cardiac catheterization yesterday tolerated the procedure well.  Noted to have multivessel CAD and patent RCA stents and PDA with critical stenosis.  Patient being evaluated for possible CABG versus high-risk multivessel PCI.LV systolic function has improved .  Objective:  Vital Signs in the last 24 hours: Temp:  [97.4 F (36.3 C)-99 F (37.2 C)] 97.4 F (36.3 C) (11/19 1134) Pulse Rate:  [33-122] 110 (11/19 1134) Resp:  [8-22] 17 (11/19 1134) BP: (69-146)/(39-112) 95/67 (11/19 1134) SpO2:  [0 %-100 %] 98 % (11/19 1134) Weight:  [104.3 kg-104.4 kg] 104.4 kg (11/18 1749)  Intake/Output from previous day: 11/18 0701 - 11/19 0700 In: 400 [P.O.:240; I.V.:160] Out: -427  Intake/Output from this shift: Total I/O In: 560.8 [P.O.:480; I.V.:80.8] Out: -   Physical Exam: Neck: no adenopathy, no carotid bruit, no JVD and supple, symmetrical, trachea midline Lungs: clear to auscultation bilaterally Heart: irregularly irregular rhythm, S1, S2 normal and soft systolic murmur noted Abdomen: soft, non-tender; bowel sounds normal; no masses,  no organomegaly Extremities: extremities normal, atraumatic, no cyanosis or edema and right groin stable  Lab Results: Recent Labs    10/14/19 1920 10/15/19 0230  WBC 4.9 6.1  HGB 11.4* 11.0*  PLT 199 183   Recent Labs    10/14/19 1141 10/14/19 1920 10/15/19 0230  NA 137  --  135  K 6.3*  --  4.7  CL 97*  --  97*  CO2 24  --  24  GLUCOSE 93  --  79  BUN 40*  --  17  CREATININE 9.85* 4.17* 5.56*   No results for input(s): TROPONINI in the last 72 hours.  Invalid input(s): CK, MB Hepatic Function Panel Recent Labs    10/13/19 0154  PROT 7.3  ALBUMIN 4.0  AST 24  ALT 16  ALKPHOS 64  BILITOT 0.5   Recent Labs    10/14/19 0219  CHOL 219*   No results for input(s): PROTIME in the last 72 hours.  Imaging: Imaging results have been reviewed and No  results found.  Cardiac Studies:  Assessment/Plan:  Status post small, Acute non-Q-wave myocardial infarction.status post left cardiac catheterization Multivessel calcific CAD , history of PCI to LAD and RCA in the past Ischemic cardiomyopathy. New onset A. Fib with RVR. Hypertension Hyperlipidemia Hypothyroidism. Morbid obesity. End-stage renal disease on hemodialysis. History of nephrotic syndrome. Degenerative joint disease. Plan Continue present management. Awaiting CVTS recommendation  LOS: 2 days    Charolette Forward 10/15/2019, 12:13 PM

## 2019-10-15 NOTE — Consult Note (Signed)
[address]       [place] [address]             [phone]        Jalissa E Vara  Medical Record [id number] Date of Birth: Jun 25, 1971  Referring: No ref. provider found Primary Care: Patient, No Pcp Per Primary Cardiologist:No primary care provider on file.  Chief Complaint:    Chief Complaint  Patient presents with   Chest Pain    History of Present Illness:     48 yo female with ESRD, and hx of multiple access issues, presents with an NSTEMI.  She states that this is the first time that she has had chest pain, but does admit to exertional dyspnea.  She is currently on hemodialysis via a permacath, but she has had multiple grafts in both upper, and left lower extermities.       Past Medical History:  Diagnosis Date   Anemia    ASCVD (arteriosclerotic cardiovascular disease)    a. s/p prior stenting of LAD b. 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 peitonitis; difficult vascular access   GERD (gastroesophageal reflux disease)    Hashimoto thyroiditis    Hyperparathyroidism    Hypertension    Hypothyroidism    Medically noncompliant    Morbid obesity ([place])     Past Surgical History:  Procedure Laterality Date   BTL  1992   LEFT HEART CATH AND CORONARY ANGIOGRAPHY N/A 02/11/2018   Procedure: LEFT HEART CATH AND CORONARY ANGIOGRAPHY;  Surgeon: Charolette Forward, MD;  Location: [facility];  Service: Cardiovascular;  Laterality: N/A;   LEFT HEART CATH AND CORONARY ANGIOGRAPHY N/A 10/14/2019   Procedure: LEFT HEART CATH AND CORONARY ANGIOGRAPHY;  Surgeon: Wellington Hampshire, MD;  Location: [facility];  Service: Cardiovascular;   Laterality: N/A;   THYROIDECTOMY, PARTIAL     Resectin of left lobe with reimplantation in the forearm, small focus of papillary carcinoma incidentally noted at pathology - 2000 and Hashimoto's thyrdoiditis in 2001   TONSILLECTOMY AND ADENOIDECTOMY        Social History   Tobacco Use  Smoking Status Former Smoker  Smokeless Tobacco Never Used    Social History   Substance and Sexual Activity  Alcohol Use No     Allergies  Allergen Reactions   Activase [Alteplase] Shortness Of Breath   Bee Pollen Anaphylaxis   Warfarin Sodium Nausea And Vomiting and Rash      Current Facility-Administered Medications  Medication Dose Route Frequency Provider Last Rate Last Dose   0.9 %  sodium chloride infusion  250 mL Intravenous PRN Kathlyn Sacramento A, MD       0.9 %  sodium chloride infusion  250 mL Intravenous PRN Wellington Hampshire, MD       acetaminophen (TYLENOL) tablet 650 mg  650 mg Oral Q4H PRN Wellington Hampshire, MD       amiodarone (PACERONE) tablet 200 mg  200 mg Oral Daily Wellington Hampshire, MD       aspirin EC tablet 81 mg  81 mg Oral Daily Wellington Hampshire, MD   [phone]  mg at 10/14/19 1059   atorvastatin (LIPITOR) tablet 80 mg  80 mg Oral q morning - 10a Wellington Hampshire, MD   80 mg at 10/14/19 1059   calcitRIOL (ROCALTROL) capsule 0.75 mcg  0.75 mcg Oral Q M,W,F-HD Wellington Hampshire, MD   0.75 mcg at 10/14/19 1103   Chlorhexidine Gluconate Cloth 2 % PADS 6 each  6 each Topical Daily Wellington Hampshire, MD   6 each at 10/14/19 1000   heparin ADULT infusion 100 units/mL (25000 units/222mL sodium chloride 0.45%)  1,450 Units/hr Intravenous Continuous Dahal, Binaya, MD 14.5 mL/hr at 10/15/19 0600 1,450 Units/hr at 10/15/19 0600   HYDROcodone-acetaminophen (NORCO/VICODIN) 5-325 MG per tablet 1 tablet  1 tablet Oral Q6H PRN Wellington Hampshire, MD   1 tablet at 10/14/19 2040   levothyroxine (SYNTHROID) tablet 300 mcg  300 mcg Oral Q0600 Wellington Hampshire, MD   300 mcg at  10/14/19 0558   metoprolol tartrate (LOPRESSOR) tablet 25 mg  25 mg Oral BID Wellington Hampshire, MD   25 mg at 10/14/19 2130   Muscle Rub CREA   Topical PRN Dahal, Marlowe Aschoff, MD       nitroGLYCERIN (NITROSTAT) SL tablet 0.4 mg  0.4 mg Sublingual Q5 Min x 3 PRN Wellington Hampshire, MD       ondansetron (ZOFRAN) tablet 4 mg  4 mg Oral Q6H PRN Wellington Hampshire, MD       Or   ondansetron (ZOFRAN) injection 4 mg  4 mg Intravenous Q6H PRN Wellington Hampshire, MD   4 mg at 10/13/19 1321   sevelamer carbonate (RENVELA) tablet 1,600 mg  1,600 mg Oral BID PRN Wellington Hampshire, MD       sevelamer carbonate (RENVELA) tablet 3,200 mg  3,200 mg Oral TID WC Kathlyn Sacramento A, MD   3,200 mg at 10/15/19 0852   sodium chloride flush (NS) 0.9 % injection 3 mL  3 mL Intravenous Once Wellington Hampshire, MD   Stopped at 10/12/19 1915   sodium chloride flush (NS) 0.9 % injection 3 mL  3 mL Intravenous Q12H Kathlyn Sacramento A, MD   3 mL at 10/14/19 2139   sodium chloride flush (NS) 0.9 % injection 3 mL  3 mL Intravenous PRN Wellington Hampshire, MD       sodium chloride flush (NS) 0.9 % injection 3 mL  3 mL Intravenous Q12H Arida, Muhammad A, MD       sodium chloride flush (NS) 0.9 % injection 3 mL  3 mL Intravenous PRN Wellington Hampshire, MD        Medications Prior to Admission  Medication Sig Dispense Refill Last Dose   albuterol (PROVENTIL HFA;VENTOLIN HFA) 108 (90 BASE) MCG/ACT inhaler Inhale 2 puffs into the lungs every 6 (six) hours as needed. Shortness of breath   10/11/2019 at Unknown time   atorvastatin (LIPITOR) 80 MG tablet Take 1 tablet (80 mg total) by mouth daily at 6 PM. (Patient taking differently: Take 80 mg by mouth every morning. ) 30 tablet 0 10/12/2019 at Unknown time   b complex-vitamin c-folic acid (NEPHRO-VITE) 0.8 MG TABS tablet Take 1 tablet by mouth daily.   10/12/2019 at Unknown time   carvedilol (COREG) 3.125 MG tablet Take 3.125 mg by mouth 2 (two) times daily with a meal.     10/12/2019 at 800   HYDROcodone-acetaminophen (NORCO/VICODIN) 5-325 MG tablet One tablet every six hours for pain.  Limit 7 days. (Patient taking differently: Take  1 tablet by mouth every 6 (six) hours as needed for moderate pain (knee pain). ) 28 tablet 0 Past Month at Unknown time   ibuprofen (ADVIL) 200 MG tablet Take 1,000 mg by mouth every morning. *May take additional dose in the evening as needed for pain   10/12/2019 at Unknown time   levothyroxine (SYNTHROID, LEVOTHROID) 300 MCG tablet Take 300 mcg by mouth at bedtime. SYNTHROID ONLY-BRAND NAME MEDICALLY NECESSARY   10/11/2019 at Unknown time   losartan (COZAAR) 25 MG tablet Take 0.5 tablets (12.5 mg total) by mouth daily. 30 tablet 3 10/12/2019 at Unknown time   nitroGLYCERIN (NITROSTAT) 0.4 MG SL tablet Place 1 tablet (0.4 mg total) under the tongue every 5 (five) minutes x 3 doses as needed for chest pain. 25 tablet 12 10/12/2019 at Unknown time   sevelamer carbonate (RENVELA) 800 MG tablet Take 1,600-3,200 mg by mouth 3 (three) times daily with meals. 3200mg  with meals and 1600mg  with snacks   10/12/2019 at morning   ticagrelor (BRILINTA) 90 MG TABS tablet Take 1 tablet (90 mg total) by mouth 2 (two) times daily. 60 tablet 3 10/12/2019 at 800   EPINEPHrine 0.3 mg/0.3 mL IJ SOAJ injection Inject 0.3 mg into the muscle as needed.   Not Taking at Unknown time    Family History  Problem Relation Age of Onset   Coronary artery disease Mother    Kidney disease Father    Diabetes Sister      Review of Systems:   Review of Systems  Constitutional: Positive for malaise/fatigue.  HENT: Negative.   Respiratory: Positive for shortness of breath.   Cardiovascular: Positive for chest pain.  Musculoskeletal: Negative.   Neurological: Negative.       Physical Exam: BP (!) 94/55 (BP Location: Left Arm)    Pulse 95    Temp (!) 97.5 F (36.4 C) (Oral)    Resp (!) 22    Ht 5\' 3"  (1.6 m)    Wt 104.4 kg    SpO2 100%    BMI 40.77  kg/m  Physical Exam  Constitutional: She is oriented to person, place, and time. No distress.  HENT:  Head: Normocephalic and atraumatic.  Eyes: No scleral icterus.  Neck: Normal range of motion. No tracheal deviation present.  Cardiovascular: Normal rate and regular rhythm.  Pulmonary/Chest: Effort normal. No respiratory distress.  Abdominal: She exhibits no distension.  Musculoskeletal: Normal range of motion.  Neurological: She is alert and oriented to person, place, and time.  Skin:  Venous stasis changes to both lower extermities        I have independently reviewed the above radiologic studies and discussed with the patient   Recent Lab Findings: Lab Results  Component Value Date   WBC 6.1 10/15/2019   HGB 11.0 (L) 10/15/2019   HCT 34.2 (L) 10/15/2019   PLT 183 10/15/2019   GLUCOSE 79 10/15/2019   CHOL 219 (H) 10/14/2019   TRIG 106 10/14/2019   HDL 37 (L) 10/14/2019   LDLCALC 161 (H) 10/14/2019   ALT 16 10/13/2019   AST 24 10/13/2019   NA 135 10/15/2019   K 4.7 10/15/2019   CL 97 (L) 10/15/2019   CREATININE 5.56 (H) 10/15/2019   BUN 17 10/15/2019   CO2 24 10/15/2019   TSH 46.838 (H) 02/09/2018   INR 1.13 02/09/2018   HGBA1C 5.1 10/15/2019      Assessment / Plan:   49 yo female with 3V CAD, and moderately reduced function.  She  has good targets, and will likely require 5 grafts for complete revascularization.  Unfortunately, I do not thing that she has enough vein conduit to allow for this.  I would recommend obtain vein mapping of her lower extremities for further evaluation.     I  spent 30 minutes counseling the patient face to face.   Lajuana Matte 10/15/2019 8:53 AM

## 2019-10-15 NOTE — Progress Notes (Signed)
Bilateral lower extremity saphenous mapping has been completed. Preliminary results can be found in CV Proc through chart review.   10/15/19 3:12 PM Lori [name] RVT

## 2019-10-15 NOTE — Progress Notes (Signed)
[place] for heparin Indication: atrial fibrillation  Allergies  Allergen Reactions  . Activase [Alteplase] Shortness Of Breath  . Bee Pollen Anaphylaxis  . Warfarin Sodium Nausea And Vomiting and Rash    Patient Measurements: Height: 5\' 3"  (160 cm) Weight: 230 lb 2.6 oz (104.4 kg) IBW/kg (Calculated) : 52.4 Heparin Dosing Weight: 77 kg  Vital Signs: Temp: 97.4 F (36.3 C) (11/19 1134) Temp Source: Oral (11/19 1134) BP: 95/67 (11/19 1134) Pulse Rate: 110 (11/19 1134)  Labs: Recent Labs    10/12/19 2119 10/13/19 0154 10/13/19 0408  10/14/19 0219 10/14/19 1141 10/14/19 1920 10/15/19 0230 10/15/19 1142  HGB  --  11.8*  --   --  12.0  --  11.4* 11.0*  --   HCT  --  37.7  --   --  37.8  --  35.3* 34.2*  --   PLT  --  237  --   --  208  --  199 183  --   HEPARINUNFRC  --   --   --    < > 0.24* 0.59  --  0.15* 0.70  CREATININE  --  6.73*  --   --   --  9.85* 4.17* 5.56*  --   TROPONINIHS 331* 1,528* 1,541*  --   --   --   --   --   --    < > = values in this interval not displayed.    Estimated Creatinine Clearance: 14.1 mL/min (A) (by C-G formula based on SCr of 5.56 mg/dL (H)).  Assessment: 48 y.o. female with Afib, s/p cath and awaiting CVTS consult, for heparin.   Heparin level came back therapeutic at upper end of goal 0.7, on 1450 units/hr. Hgb 11, plt 183. No s/sx of bleeding or infusion issues. Heparin is running in same location as infusing - lab drew after stopping infusion.   Goal of Therapy:  Heparin level 0.3-0.7 units/ml Monitor platelets by anticoagulation protocol: Yes   Plan:  Decrease heparin infusion to 1400 units/hr to keep in goal range Monitor daily HL, CBC, and for s/sx of bleeding  Antonietta Jewel, PharmD, BCCCP Clinical Pharmacist  Phone: [phone]  Please check AMION for all [place] [place] phone numbers After 10:00 PM, call [place] [phone]

## 2019-10-15 NOTE — Progress Notes (Signed)
Subjective:  Had HD yest-  Unable to remove any fluid due to hypotension - no c/o's this AM-  No more CP Objective Vital signs in last 24 hours: Vitals:   10/14/19 1950 10/14/19 2137 10/14/19 2357 10/15/19 0314  BP: 92/82 104/72 94/79 97/66   Pulse: (!) 33 (!) 108  (!) 101  Resp: 18 13  (!) 21  Temp: 99 F (37.2 C)  97.7 F (36.5 C) (!) 97.5 F (36.4 C)  TempSrc: Oral  Oral Oral  SpO2: 92% 97% 97% 100%  Weight:      Height:       Weight change:   Intake/Output Summary (Last 24 hours) at 10/15/2019 0753 Last data filed at 10/15/2019 0600 Gross per 24 hour  Intake 159.98 ml  Output -427 ml  Net 586.98 ml   Dialysis Orders: : Lori [name] [facility] on MWF . EDW 102kg HD Bath 2k, 2.5ca  Time 4hr Heparin 10,000. Access R IJ Perm cath (P cath Dependant )     Calcitriol 0.38mcg  Po q hd Mirciera 50 mcg iv q 4 wks  ( last on 09/23/19)  Op hgb 10.5 (10/07/19 )   Assessment/Plan 1. Acute NonQ wave MI ( ho Multivessel  CAD sp LAD/ RCA  PCI ibn past ) - wu per Card.   Has multivessel disease - to have CTS eval 2. New onset A Fib with RVR - per cards 3. ESRD - HD MWF  Plan HD tomorrow on schedule via [facility] 4. Hypertension/volume  - bp currently lowish / per wts at  102.4 kg  =close to edw  CXR 11/16 =NAD, CM 5. Anemia of ESRD= HGB 11-  no current esa ned fu trend,, 6. Metabolic bone disease -  Po vit d on hd and binder( renvela)  with meals Ca 8.2  7. Hypothyroidism - HO  Chronic noncompliance with Levothyroxine in past     [place]: Basic Metabolic Panel: Recent Labs  Lab 10/13/19 0154 10/14/19 1141 10/14/19 1920 10/15/19 0230  NA 137 137  --  135  K 4.3 6.3*  --  4.7  CL 97* 97*  --  97*  CO2 27 24  --  24  GLUCOSE 104* 93  --  79  BUN 25* 40*  --  17  CREATININE 6.73* 9.85* 4.17* 5.56*  CALCIUM 8.3* 8.2*  --  8.1*   Liver Function Tests: Recent Labs  Lab 10/13/19 0154  AST 24  ALT 16  ALKPHOS 64  BILITOT 0.5  PROT 7.3  ALBUMIN 4.0   No results for  input(s): LIPASE, AMYLASE in the last 168 hours. No results for input(s): AMMONIA in the last 168 hours. CBC: Recent Labs  Lab 10/12/19 1925 10/13/19 0154 10/14/19 0219 10/14/19 1920 10/15/19 0230  WBC 6.4 7.1 7.6 4.9 6.1  HGB 11.6* 11.8* 12.0 11.4* 11.0*  HCT 36.9 37.7 37.8 35.3* 34.2*  MCV 98.4 98.2 96.7 96.2 96.6  PLT 227 237 208 199 183   Cardiac Enzymes: No results for input(s): CKTOTAL, CKMB, CKMBINDEX, TROPONINI in the last 168 hours. CBG: No results for input(s): GLUCAP in the last 168 hours.  Iron Studies: No results for input(s): IRON, TIBC, TRANSFERRIN, FERRITIN in the last 72 hours. Studies/Results: No results found. Medications: Infusions: . sodium chloride    . sodium chloride    . heparin 1,450 Units/hr (10/15/19 0600)    Scheduled Medications: . amiodarone  200 mg Oral Daily  . aspirin EC  81 mg  Oral Daily  . atorvastatin  80 mg Oral q morning - 10a  . calcitRIOL  0.75 mcg Oral Q M,W,F-HD  . Chlorhexidine Gluconate Cloth  6 each Topical Daily  . levothyroxine  300 mcg Oral Q0600  . metoprolol tartrate  25 mg Oral BID  . sevelamer carbonate  3,200 mg Oral TID WC  . sodium chloride flush  3 mL Intravenous Once  . sodium chloride flush  3 mL Intravenous Q12H  . sodium chloride flush  3 mL Intravenous Q12H    have reviewed scheduled and prn medications.  Physical Exam: General: NAD-  Eating breafast Heart: irreg Lungs: mostly clear Abdomen: soft,non tender Extremities: chronic changes-  Woody edema Dialysis Access: TDC     10/15/2019,7:53 AM  LOS: 2 days

## 2019-10-16 ENCOUNTER — Inpatient Hospital Stay (HOSPITAL_COMMUNITY): Payer: Medicare Other

## 2019-10-16 LAB — CBC
HCT: 34.3 % — ABNORMAL LOW (ref 36.0–46.0)
Hemoglobin: 11 g/dL — ABNORMAL LOW (ref 12.0–15.0)
MCH: 30.9 pg (ref 26.0–34.0)
MCHC: 32.1 g/dL (ref 30.0–36.0)
MCV: 96.3 fL (ref 80.0–100.0)
Platelets: 200 10*3/uL (ref 150–400)
RBC: 3.56 MIL/uL — ABNORMAL LOW (ref 3.87–5.11)
RDW: 14.3 % (ref 11.5–15.5)
WBC: 7.2 10*3/uL (ref 4.0–10.5)
nRBC: 0 % (ref 0.0–0.2)

## 2019-10-16 LAB — RENAL FUNCTION PANEL
Albumin: 3.2 g/dL — ABNORMAL LOW (ref 3.5–5.0)
Anion gap: 12 (ref 5–15)
BUN: 26 mg/dL — ABNORMAL HIGH (ref 6–20)
CO2: 28 mmol/L (ref 22–32)
Calcium: 8 mg/dL — ABNORMAL LOW (ref 8.9–10.3)
Chloride: 94 mmol/L — ABNORMAL LOW (ref 98–111)
Creatinine, Ser: 6.41 mg/dL — ABNORMAL HIGH (ref 0.44–1.00)
GFR calc Af Amer: 8 mL/min — ABNORMAL LOW (ref >60)
GFR calc non Af Amer: 7 mL/min — ABNORMAL LOW (ref >60)
Glucose, Bld: 114 mg/dL — ABNORMAL HIGH (ref 70–99)
Phosphorus: 5.3 mg/dL — ABNORMAL HIGH (ref 2.5–4.6)
Potassium: 4.5 mmol/L (ref 3.5–5.1)
Sodium: 134 mmol/L — ABNORMAL LOW (ref 135–145)

## 2019-10-16 LAB — HEPARIN LEVEL (UNFRACTIONATED): Heparin Unfractionated: 0.39 IU/mL (ref 0.30–0.70)

## 2019-10-16 LAB — CREATININE, SERUM
Creatinine, Ser: 4.17 mg/dL — ABNORMAL HIGH (ref 0.44–1.00)
GFR calc Af Amer: 14 mL/min — ABNORMAL LOW (ref >60)
GFR calc non Af Amer: 12 mL/min — ABNORMAL LOW (ref >60)

## 2019-10-16 MED ORDER — CALCITRIOL 0.5 MCG PO CAPS
ORAL_CAPSULE | ORAL | Status: AC
  Filled 2019-10-16: qty 1

## 2019-10-16 MED ORDER — HEPARIN SODIUM (PORCINE) 1000 UNIT/ML IJ SOLN
INTRAMUSCULAR | Status: AC
  Administered 2019-10-16: 11:00:00
  Filled 2019-10-16: qty 5

## 2019-10-16 MED ORDER — CALCITRIOL 0.25 MCG PO CAPS
ORAL_CAPSULE | ORAL | Status: AC
  Filled 2019-10-16: qty 1

## 2019-10-16 MED ORDER — HEPARIN SODIUM (PORCINE) 1000 UNIT/ML DIALYSIS
20.0000 [IU]/kg | INTRAMUSCULAR | Status: DC | PRN
  Administered 2019-10-16: 4300 [IU] via INTRAVENOUS_CENTRAL

## 2019-10-16 NOTE — Plan of Care (Signed)

## 2019-10-16 NOTE — Progress Notes (Signed)
Subjective:  On HD at present -  No new c/o's -  Preparations being made for CABG Objective Vital signs in last 24 hours: Vitals:   10/15/19 1644 10/15/19 1950 10/15/19 2340 10/16/19 0401  BP: 124/83 (!) 88/55 137/89 (!) 80/65  Pulse: 75 95 (!) 101 86  Resp: 17 (!) 27 16 19   Temp: (!) 97.3 F (36.3 C) (!) 97.5 F (36.4 C) (!) 97.5 F (36.4 C) 97.6 F (36.4 C)  TempSrc: Oral Oral Oral Oral  SpO2: 100% 99% 97% 100%  Weight:      Height:       Weight change:   Intake/Output Summary (Last 24 hours) at 10/16/2019 [id number] Last data filed at 10/15/2019 1900 Gross per 24 hour  Intake 904.09 ml  Output -  Net 904.09 ml   Dialysis Orders: : [facility] on MWF . EDW 102kg HD Bath 2k, 2.5ca  Time 4hr Heparin 10,000. Access R IJ Perm cath (P cath Dependant )     Calcitriol 0.43mcg  Po q hd Mirciera 50 mcg iv q 4 wks  ( last on 09/23/19)  Op hgb 10.5 (10/07/19 )   Assessment/Plan 1. Acute NonQ wave MI ( ho Multivessel  CAD sp LAD/ RCA  PCI ibn past ) - wu per Card.   Has multivessel disease - to have CTS eval- possible CABG 2. New onset A Fib with RVR - per cards 3. ESRD - HD MWF  Plan HD today on schedule via [facility]- next treatment will be Sunday due to holiday schedule  4. Hypertension/volume  - bp variable - close to edw  CXR 11/16 =NAD, CM-  Goal of 2300 total today  5. Anemia of ESRD= HGB 11-  no current esa ned fu trend,, 6. Metabolic bone disease -  Po vit d on hd and binder( renvela)  with meals Ca 8.1- check phos tomorrow   7. Hypothyroidism - HO  Chronic noncompliance with Levothyroxine in past     [place]: Basic Metabolic Panel: Recent Labs  Lab 10/13/19 0154 10/14/19 1141 10/14/19 1920 10/15/19 0230  NA 137 137  --  135  K 4.3 6.3*  --  4.7  CL 97* 97*  --  97*  CO2 27 24  --  24  GLUCOSE 104* 93  --  79  BUN 25* 40*  --  17  CREATININE 6.73* 9.85* 4.17* 5.56*  CALCIUM 8.3* 8.2*  --  8.1*   Liver Function Tests: Recent Labs  Lab  10/13/19 0154  AST 24  ALT 16  ALKPHOS 64  BILITOT 0.5  PROT 7.3  ALBUMIN 4.0   No results for input(s): LIPASE, AMYLASE in the last 168 hours. No results for input(s): AMMONIA in the last 168 hours. CBC: Recent Labs  Lab 10/13/19 0154 10/14/19 0219 10/14/19 1920 10/15/19 0230 10/16/19 0524  WBC 7.1 7.6 4.9 6.1 7.2  HGB 11.8* 12.0 11.4* 11.0* 11.0*  HCT 37.7 37.8 35.3* 34.2* 34.3*  MCV 98.2 96.7 96.2 96.6 96.3  PLT 237 208 199 183 200   Cardiac Enzymes: No results for input(s): CKTOTAL, CKMB, CKMBINDEX, TROPONINI in the last 168 hours. CBG: No results for input(s): GLUCAP in the last 168 hours.  Iron Studies: No results for input(s): IRON, TIBC, TRANSFERRIN, FERRITIN in the last 72 hours. Studies/Results: Vas [place] Lower Extremity Saphenous Vein Mapping  Result Date: 10/15/2019 LOWER EXTREMITY VEIN MAPPING Indications:  CAD Risk Factors: Coronary artery disease.  Comparison Study: No prior  studies. Performing Technologist: Oliver Hum RVT  Examination Guidelines: A complete evaluation includes B-mode imaging, spectral Doppler, color Doppler, and power Doppler as needed of all accessible portions of each vessel. Bilateral testing is considered an integral part of a complete examination. Limited examinations for reoccurring indications may be performed as noted. +---------------+-----------+----------------------+---------------+-----------+   RT Diameter  RT Findings         GSV            LT Diameter  LT Findings      (cm)                                            (cm)                  +---------------+-----------+----------------------+---------------+-----------+      0.73                     Saphenofemoral         0.23                                                   Junction                                  +---------------+-----------+----------------------+---------------+-----------+      0.39                     Proximal thigh         0.22                   +---------------+-----------+----------------------+---------------+-----------+      0.38                       Mid thigh            0.31       branching  +---------------+-----------+----------------------+---------------+-----------+      0.42                      Distal thigh          0.24                  +---------------+-----------+----------------------+---------------+-----------+      0.31                          Knee              0.18                  +---------------+-----------+----------------------+---------------+-----------+      0.38       branching       Prox calf            0.24                  +---------------+-----------+----------------------+---------------+-----------+      0.15                        Mid calf            0.30                  +---------------+-----------+----------------------+---------------+-----------+  0.11                      Distal calf           0.25                  +---------------+-----------+----------------------+---------------+-----------+ Diagnosing physician: Monica Martinez MD Electronically signed by Monica Martinez MD on 10/15/2019 at 7:19:22 PM.    Final    Medications: Infusions: . sodium chloride    . sodium chloride    . heparin 1,400 Units/hr (10/15/19 1612)  . sodium chloride      Scheduled Medications: . amiodarone  200 mg Oral Daily  . aspirin EC  81 mg Oral Daily  . atorvastatin  80 mg Oral q morning - 10a  . calcitRIOL  0.75 mcg Oral Q M,W,F-HD  . Chlorhexidine Gluconate Cloth  6 each Topical Daily  . Chlorhexidine Gluconate Cloth  6 each Topical Q0600  . levothyroxine  300 mcg Oral QHS  . metoprolol tartrate  25 mg Oral BID  . sevelamer carbonate  3,200 mg Oral TID WC  . sodium chloride flush  3 mL Intravenous Once  . sodium chloride flush  3 mL Intravenous Q12H  . sodium chloride flush  3 mL Intravenous Q12H    have reviewed scheduled and prn  medications.  Physical Exam: General: NAD- seen on HD Heart: irreg Lungs: mostly clear Abdomen: soft,non tender Extremities: chronic changes-  Woody edema Dialysis Access: TDC     10/16/2019,9:03 AM  LOS: 3 days

## 2019-10-16 NOTE — Progress Notes (Signed)
PROGRESS NOTE  Lori [name]  DOB: 1971/06/18  PCP: Patient, No Pcp Per MU:[id number]  DOA: 10/12/2019  LOS: 3 days   Chief Complaint  Patient presents with  . Chest Pain   Brief narrative: Patient is a 48 y.o. female, with h/o CAD s/p stent to LAD, hypertension, ESRD on HD MWF, hypothyroidism, hyperparathyroidism, GERD.  Patient presented to the hospital on 11/16 with chest pain, which started after she came back from  hemodialysis. Chest pain did not improve with nitroglycerin and hence came to the ED. Patient's last heart cath was done by Dr. Terrence Dupont in 2019 with placement of drug-eluting stent in LAD. In the ED initial troponin was 44, repeat troponin was 331. Dr. Roderic Palau spoke to Dr. Terrence Dupont, who recommended that patient be started on heparin, full dose anticoagulation. She was also found to be in A. fib with RVR. Patient was admitted to hospitalist medicine service.  Cardiology consultation was called. 11/18, patient underwent cardiac catheterization.  Per cardiac cath note, patient has severe heavily calcified three-vessel coronary artery disease, severely tortuous. PCI options are limited because of multiple lesions, calcification and tortuosity.  Cardiovascular consultation called to see if she is a candidate for CABG.  Subjective: Patient was seen and examined this morning in dialysis unit.  Not in distress.  No chest pain.  She is waiting for definitive answer from cardiothoracic surgery.  Assessment/Plan: Acute coronary syndrome Multivessel coronary disease s/p PCI to LAD and RCA in the past Ischemic cardiomyopathy -LVEF 50% -patient presented with chest pain with elevated troponin.   -11/18, cardiac cath.  Multivessel disease.  Cardiovascular surgery consultation was obtained.  Being planned for CABG.  Underwent venous mapping of the lower extremities yesterday.  Brilinta remains on hold.    Atrial fibrillation -Coreg 3.125 mg for rate control. Continue heparin drip.   ESRD on HD, MWF -Nephrology consultation appreciated.  Regular dialysis today.  Hypothyroidism-continue Synthroid  Hypertension-continue Cozaar, Coreg.  Morbid obesity - Body mass index is 41.12 kg/m. Patient has been advised to make an attempt to improve diet and exercise patterns to aid in weight loss.  Mobility: Encourage ambulation DVT prophylaxis:  Heparin drip Code Status:   Code Status: Full Code  Family Communication:  None Expected Discharge:  Pending CT surgery planning of CABG  Consultants:  Cardiology, nephrology, CT surgery  Procedures:  Cardiac cath 11/18  Antimicrobials: Anti-infectives (From admission, onward)   None      Diet Order            Diet Heart Room service appropriate? Yes with Assist; Fluid consistency: Thin  Diet effective now              Infusions:  . sodium chloride    . sodium chloride    . heparin 1,400 Units/hr (10/15/19 1612)  . sodium chloride      Scheduled Meds: . amiodarone  200 mg Oral Daily  . aspirin EC  81 mg Oral Daily  . atorvastatin  80 mg Oral q morning - 10a  . calcitRIOL      . calcitRIOL      . calcitRIOL  0.75 mcg Oral Q M,W,F-HD  . Chlorhexidine Gluconate Cloth  6 each Topical Daily  . Chlorhexidine Gluconate Cloth  6 each Topical Q0600  . heparin      . levothyroxine  300 mcg Oral QHS  . metoprolol tartrate  25 mg Oral BID  . sevelamer carbonate  3,200 mg Oral TID WC  . sodium chloride  flush  3 mL Intravenous Once  . sodium chloride flush  3 mL Intravenous Q12H  . sodium chloride flush  3 mL Intravenous Q12H    PRN meds: sodium chloride, sodium chloride, acetaminophen, HYDROcodone-acetaminophen, Muscle Rub, nitroGLYCERIN, ondansetron **OR** ondansetron (ZOFRAN) IV, sevelamer carbonate, sodium chloride flush, sodium chloride flush   Objective: Vitals:   10/16/19 1026 10/16/19 1056  BP: (!) 102/44 (!) 113/40  Pulse: 96 93  Resp: 20 18  Temp:    SpO2:      Intake/Output Summary (Last 24  hours) at 10/16/2019 1250 Last data filed at 10/15/2019 1900 Gross per 24 hour  Intake 583.32 ml  Output -  Net 583.32 ml   Filed Weights   10/14/19 1340 10/14/19 1749 10/16/19 0745  Weight: 104.3 kg 104.4 kg 105.3 kg   Weight change:  Body mass index is 41.12 kg/m.   Physical Exam:  General exam: Appears calm and comfortable.  Skin: No rashes, lesions or ulcers.  Generalized skin dryness. HEENT: Atraumatic, normocephalic, supple neck, no obvious bleeding Lungs: Clear to auscultation bilaterally CVS: Regular rate and rhythm, no murmur GI/Abd soft, nontender, nondistended, bowel sound present CNS: Alert, awake, oriented x3 Psychiatry: Mood appropriate Extremities: No pedal edema, no calf tenderness  Data Review: I have personally reviewed the laboratory data and studies available.  Recent Labs  Lab 10/13/19 0154 10/14/19 0219 10/14/19 1920 10/15/19 0230 10/16/19 0524  WBC 7.1 7.6 4.9 6.1 7.2  HGB 11.8* 12.0 11.4* 11.0* 11.0*  HCT 37.7 37.8 35.3* 34.2* 34.3*  MCV 98.2 96.7 96.2 96.6 96.3  PLT 237 208 199 183 200   Recent Labs  Lab 10/12/19 1925 10/13/19 0154 10/14/19 1141 10/14/19 1920 10/15/19 0230 10/16/19 0823  NA 134* 137 137  --  135 134*  K 3.7 4.3 6.3*  --  4.7 4.5  CL 93* 97* 97*  --  97* 94*  CO2 28 27 24   --  24 28  GLUCOSE 109* 104* 93  --  79 114*  BUN 23* 25* 40*  --  17 26*  CREATININE 5.95* 6.73* 9.85* 4.17* 5.56* 6.41*  CALCIUM 8.1* 8.3* 8.2*  --  8.1* 8.0*  MG  --   --   --   --  1.8  --   PHOS  --   --   --   --   --  5.3*    Terrilee Croak, MD  [facility] 10/16/2019

## 2019-10-16 NOTE — Procedures (Signed)
Patient was seen on dialysis and the procedure was supervised.  BFR 400  Via TDC BP is  152/56.   Patient appears to be tolerating treatment well  Louis Meckel 10/16/2019

## 2019-10-16 NOTE — Progress Notes (Signed)
Received report from Dialysis

## 2019-10-16 NOTE — Progress Notes (Signed)
Patient via bed to hemodialysis

## 2019-10-16 NOTE — Progress Notes (Signed)
Subjective:  Seen earlier during hemodialysis tolerating well.  Patient denies any chest pain nor shortness of breaths.  Patient being worked up for possible CABG. Objective:  Vital Signs in the last 24 hours: Temp:  [97 F (36.1 C)-97.6 F (36.4 C)] 97 F (36.1 C) (11/20 0745) Pulse Rate:  [60-101] 93 (11/20 1056) Resp:  [16-27] 18 (11/20 1056) BP: (80-218)/(40-94) 113/40 (11/20 1056) SpO2:  [92 %-100 %] 100 % (11/20 0745) Weight:  [105.3 kg] 105.3 kg (11/20 0745)  Intake/Output from previous day: 11/19 0701 - 11/20 0700 In: 1144.1 [P.O.:960; I.V.:184.1] Out: -  Intake/Output from this shift: No intake/output data recorded.  Physical Exam: Neck: no adenopathy, no carotid bruit, no JVD and supple, symmetrical, trachea midline Lungs: clear to auscultation bilaterally Heart: irregularly irregular rhythm, S1, S2 normal and soft systolic murmur noted Abdomen: soft, non-tender; bowel sounds normal; no masses,  no organomegaly Extremities: no clubbing, cyanosis, 1+ edema noted  Lab Results: Recent Labs    10/15/19 0230 10/16/19 0524  WBC 6.1 7.2  HGB 11.0* 11.0*  PLT 183 200   Recent Labs    10/15/19 0230 10/16/19 0823  NA 135 134*  K 4.7 4.5  CL 97* 94*  CO2 24 28  GLUCOSE 79 114*  BUN 17 26*  CREATININE 5.56* 6.41*   No results for input(s): TROPONINI in the last 72 hours.  Invalid input(s): CK, MB Hepatic Function Panel Recent Labs    10/16/19 0823  ALBUMIN 3.2*   Recent Labs    10/14/19 0219  CHOL 219*   No results for input(s): PROTIME in the last 72 hours.  Imaging: Imaging results have been reviewed and Vas [place] Lower Extremity Saphenous Vein Mapping  Result Date: 10/15/2019 LOWER EXTREMITY VEIN MAPPING Indications:  CAD Risk Factors: Coronary artery disease.  Comparison Study: No prior studies. Performing Technologist: Oliver Hum RVT  Examination Guidelines: A complete evaluation includes B-mode imaging, spectral Doppler, color Doppler, and  power Doppler as needed of all accessible portions of each vessel. Bilateral testing is considered an integral part of a complete examination. Limited examinations for reoccurring indications may be performed as noted. +---------------+-----------+----------------------+---------------+-----------+   RT Diameter  RT Findings         GSV            LT Diameter  LT Findings      (cm)                                            (cm)                  +---------------+-----------+----------------------+---------------+-----------+      0.73                     Saphenofemoral         0.23                                                   Junction                                  +---------------+-----------+----------------------+---------------+-----------+      0.39  Proximal thigh         0.22                  +---------------+-----------+----------------------+---------------+-----------+      0.38                       Mid thigh            0.31       branching  +---------------+-----------+----------------------+---------------+-----------+      0.42                      Distal thigh          0.24                  +---------------+-----------+----------------------+---------------+-----------+      0.31                          Knee              0.18                  +---------------+-----------+----------------------+---------------+-----------+      0.38       branching       Prox calf            0.24                  +---------------+-----------+----------------------+---------------+-----------+      0.15                        Mid calf            0.30                  +---------------+-----------+----------------------+---------------+-----------+      0.11                      Distal calf           0.25                  +---------------+-----------+----------------------+---------------+-----------+ Diagnosing physician:  Monica Martinez MD Electronically signed by Monica Martinez MD on 10/15/2019 at 7:19:22 PM.    Final     Cardiac Studies:  Assessment/Plan:  Status post small, Acute non-Q-wave myocardial infarction.status post left cardiac catheterization Multivessel calcific CAD , history of PCI to LAD and RCA in the past Ischemic cardiomyopathy. New onset A. Fib with RVR. Hypertension Hyperlipidemia Hypothyroidism. Morbid obesity. End-stage renal disease on hemodialysis. History of nephrotic syndrome. Degenerative joint disease. Plan Continue present management.   LOS: 3 days    Charolette Forward 10/16/2019, 11:36 AM

## 2019-10-16 NOTE — Progress Notes (Signed)
ANTICOAGULATION CONSULT NOTE - Follow Up Consult  Pharmacy Consult for Heparin Indication: atrial fibrillation and CAD, awaiting possible CABG  Allergies  Allergen Reactions  . Activase [Alteplase] Shortness Of Breath  . Bee Pollen Anaphylaxis  . Warfarin Sodium Nausea And Vomiting and Rash    Patient Measurements: Height: 5\' 3"  (160 cm) Weight: 230 lb 2.6 oz (104.4 kg) IBW/kg (Calculated) : 52.4 Heparin Dosing Weight: 77 kg  Vital Signs: Temp: 97.6 F (36.4 C) (11/20 0401) Temp Source: Oral (11/20 0401) BP: 80/65 (11/20 0401) Pulse Rate: 86 (11/20 0401)  Labs: Recent Labs    10/14/19 1920 10/15/19 0230 10/15/19 1142 10/16/19 0524 10/16/19 0823  HGB 11.4* 11.0*  --  11.0*  --   HCT 35.3* 34.2*  --  34.3*  --   PLT 199 183  --  200  --   HEPARINUNFRC  --  0.15* 0.70 0.39  --   CREATININE 4.17* 5.56*  --   --  6.41*    Assessment:  48 y.o. female with Afib, s/p cath 11/18. Multivessel CAD with possible plans for CABG. Continues on IV heparin. ESRD - MWF HD   Heparin level is therapeutic (0.39) on 1400 units/hr. CBC stable.  Brilinta held for possible CABG.  Last Brilinta dose 11/18 am.  Goal of Therapy:  Heparin level 0.3-0.7 units/ml Monitor platelets by anticoagulation protocol: Yes   Plan:  Continue heparin drip at 1400 units/hr Daily heparin level and CBC. Follow up plans.  Arty Baumgartner, [place] Pager: [phone] or phone: [phone] 10/16/2019,9:53 AM

## 2019-10-17 LAB — CBC
HCT: 32.5 % — ABNORMAL LOW (ref 36.0–46.0)
Hemoglobin: 10.2 g/dL — ABNORMAL LOW (ref 12.0–15.0)
MCH: 30.5 pg (ref 26.0–34.0)
MCHC: 31.4 g/dL (ref 30.0–36.0)
MCV: 97.3 fL (ref 80.0–100.0)
Platelets: 188 10*3/uL (ref 150–400)
RBC: 3.34 MIL/uL — ABNORMAL LOW (ref 3.87–5.11)
RDW: 14.5 % (ref 11.5–15.5)
WBC: 6.7 10*3/uL (ref 4.0–10.5)
nRBC: 0 % (ref 0.0–0.2)

## 2019-10-17 LAB — RENAL FUNCTION PANEL
Albumin: 3.3 g/dL — ABNORMAL LOW (ref 3.5–5.0)
Anion gap: 13 (ref 5–15)
BUN: 16 mg/dL (ref 6–20)
CO2: 28 mmol/L (ref 22–32)
Calcium: 8.4 mg/dL — ABNORMAL LOW (ref 8.9–10.3)
Chloride: 96 mmol/L — ABNORMAL LOW (ref 98–111)
Creatinine, Ser: 5.5 mg/dL — ABNORMAL HIGH (ref 0.44–1.00)
GFR calc Af Amer: 10 mL/min — ABNORMAL LOW (ref >60)
GFR calc non Af Amer: 8 mL/min — ABNORMAL LOW (ref >60)
Glucose, Bld: 99 mg/dL (ref 70–99)
Phosphorus: 5.3 mg/dL — ABNORMAL HIGH (ref 2.5–4.6)
Potassium: 4.6 mmol/L (ref 3.5–5.1)
Sodium: 137 mmol/L (ref 135–145)

## 2019-10-17 LAB — HEPARIN LEVEL (UNFRACTIONATED): Heparin Unfractionated: 0.42 IU/mL (ref 0.30–0.70)

## 2019-10-17 MED ORDER — CHLORHEXIDINE GLUCONATE CLOTH 2 % EX PADS
6.0000 | MEDICATED_PAD | Freq: Every day | CUTANEOUS | Status: DC
  Administered 2019-10-18 – 2019-10-20 (×3): 6 via TOPICAL

## 2019-10-17 MED ORDER — ASPIRIN 81 MG PO TBEC: 81.0000 mg | DELAYED_RELEASE_TABLET | Freq: Every day | ORAL | 0 refills | Status: AC

## 2019-10-17 MED ORDER — AMIODARONE HCL 200 MG PO TABS: 200.0000 mg | ORAL_TABLET | Freq: Every day | ORAL | 0 refills | Status: DC

## 2019-10-17 MED ORDER — METOPROLOL TARTRATE 25 MG PO TABS: 12.5000 mg | ORAL_TABLET | Freq: Two times a day (BID) | ORAL | 0 refills | Status: DC

## 2019-10-17 MED ORDER — METOPROLOL TARTRATE 12.5 MG HALF TABLET
12.5000 mg | ORAL_TABLET | Freq: Two times a day (BID) | ORAL | Status: DC
  Administered 2019-10-17: 21:00:00 12.5 mg via ORAL
  Filled 2019-10-17 (×2): qty 1

## 2019-10-17 NOTE — Progress Notes (Signed)
ANTICOAGULATION CONSULT NOTE - Follow Up Consult  Pharmacy Consult for Heparin Indication: atrial fibrillation and CAD, awaiting possible CABG  Allergies  Allergen Reactions  . Activase [Alteplase] Shortness Of Breath  . Bee Pollen Anaphylaxis  . Warfarin Sodium Nausea And Vomiting and Rash    Patient Measurements: Height: 5\' 3"  (160 cm) Weight: 228 lb 13.4 oz (103.8 kg) IBW/kg (Calculated) : 52.4 Heparin Dosing Weight: 77 kg  Vital Signs: Temp: 97.6 F (36.4 C) (11/21 0827) Temp Source: Oral (11/21 0827) BP: 106/70 (11/21 0827) Pulse Rate: 58 (11/21 0827)  Labs: Recent Labs    10/15/19 0230 10/15/19 1142 10/16/19 0524 10/16/19 0823 10/17/19 0152  HGB 11.0*  --  11.0*  --  10.2*  HCT 34.2*  --  34.3*  --  32.5*  PLT 183  --  200  --  188  HEPARINUNFRC 0.15* 0.70 0.39  --  0.42  CREATININE 5.56*  --   --  6.41* 5.50*    Assessment:  48 y.o. female with Afib, s/p cath 11/18. Multivessel CAD with possible plans for CABG. Continues on IV heparin. ESRD - MWF HD   Heparin level is therapeutic (0.42) on 1400 units/hr. CBC stable.  Brilinta held for possible CABG.  Last Brilinta dose 11/18 am.  Goal of Therapy:  Heparin level 0.3-0.7 units/ml Monitor platelets by anticoagulation protocol: Yes   Plan:  Continue heparin drip at 1400 units/hr Daily heparin level and CBC. Follow up plans.  Erin Hearing PharmD., BCPS Clinical Pharmacist 10/17/2019 8:44 AM

## 2019-10-17 NOTE — Progress Notes (Signed)
Subjective:  Doing well denies any chest pain or shortness of breath up in chair.  Sinus bradycardia on the monitor  Objective:  Vital Signs in the last 24 hours: Temp:  [97.5 F (36.4 C)-98 F (36.7 C)] 97.6 F (36.4 C) (11/21 0827) Pulse Rate:  [50-98] 58 (11/21 0827) Resp:  [11-20] 18 (11/21 0827) BP: (63-122)/(40-88) 106/70 (11/21 0827) SpO2:  [89 %-100 %] 99 % (11/21 0827) Weight:  [103.8 kg] 103.8 kg (11/20 1205)  Intake/Output from previous day: 11/20 0701 - 11/21 0700 In: 485.1 [I.V.:485.1] Out: 1398  Intake/Output from this shift: Total I/O In: 388 [P.O.:360; I.V.:28] Out: -   Physical Exam: Neck: no adenopathy, no carotid bruit, no JVD and supple, symmetrical, trachea midline Lungs: clear to auscultation bilaterally Heart: regular rate and rhythm, S1, S2 normal and Soft systolic murmur noted Abdomen: soft, non-tender; bowel sounds normal; no masses,  no organomegaly Extremities: No clubbing cyanosis 1+ edema noted  Lab Results: Recent Labs    10/16/19 0524 10/17/19 0152  WBC 7.2 6.7  HGB 11.0* 10.2*  PLT 200 188   Recent Labs    10/16/19 0823 10/17/19 0152  NA 134* 137  K 4.5 4.6  CL 94* 96*  CO2 28 28  GLUCOSE 114* 99  BUN 26* 16  CREATININE 6.41* 5.50*   No results for input(s): TROPONINI in the last 72 hours.  Invalid input(s): CK, MB Hepatic Function Panel Recent Labs    10/17/19 0152  ALBUMIN 3.3*   No results for input(s): CHOL in the last 72 hours. No results for input(s): PROTIME in the last 72 hours.  Imaging: Imaging results have been reviewed and Ct Chest Wo Contrast  Result Date: 10/16/2019 CLINICAL DATA:  Chest pain, shortness of breath EXAM: CT CHEST WITHOUT CONTRAST TECHNIQUE: Multidetector CT imaging of the chest was performed following the standard protocol without IV contrast. COMPARISON:  Chest x-ray 10/12/2019. CT 02/09/2018 FINDINGS: Cardiovascular: Right-sided hemodialysis catheter terminates within the right atrium.  Mild cardiomegaly. Calcifications and or prior stenting of the coronary arteries. Thoracic aorta is nonaneurysmal. There is scattered atherosclerotic calcifications of the aorta. Vascular stents within the right subclavian/axillary region. Mediastinum/Nodes: Surgical clips in the region of the thyroid bed. No thyroid tissue is seen. No axillary, mediastinal, or hilar lymphadenopathy. Trachea and esophagus appear within normal limits. Lungs/Pleura: There are a few small scattered areas of predominantly ground-glass opacity including within the right lower lobe (series 8, image 103 and 65) and within the left upper lobe (series 8, image 59) no pleural effusion. No pneumothorax. Upper Abdomen: The visualized right kidney is atrophic with multiple cysts, incompletely evaluated no acute findings within the visualized upper abdomen. Prominent collateral vasculature within the subcutaneous tissues of the abdominal wall. Musculoskeletal: Mild diffuse increased bone mineral density compatible with renal osteodystrophy. No acute osseous findings. IMPRESSION: 1. There are a few scattered small areas of predominantly ground-glass opacity within both lungs, nonspecific but may reflect areas of inflammation and/or infection. 2. Extensive atherosclerotic disease of the aorta and coronary arteries, advanced for age. Aortic Atherosclerosis (ICD10-I70.0). Electronically Signed   By: Davina Poke M.D.   On: 10/16/2019 19:40   Vas [place] Lower Extremity Saphenous Vein Mapping  Result Date: 10/15/2019 LOWER EXTREMITY VEIN MAPPING Indications:  CAD Risk Factors: Coronary artery disease.  Comparison Study: No prior studies. Performing Technologist: Oliver Hum RVT  Examination Guidelines: A complete evaluation includes B-mode imaging, spectral Doppler, color Doppler, and power Doppler as needed of all accessible portions of each vessel.  Bilateral testing is considered an integral part of a complete examination. Limited  examinations for reoccurring indications may be performed as noted. +---------------+-----------+----------------------+---------------+-----------+   RT Diameter  RT Findings         GSV            LT Diameter  LT Findings      (cm)                                            (cm)                  +---------------+-----------+----------------------+---------------+-----------+      0.73                     Saphenofemoral         0.23                                                   Junction                                  +---------------+-----------+----------------------+---------------+-----------+      0.39                     Proximal thigh         0.22                  +---------------+-----------+----------------------+---------------+-----------+      0.38                       Mid thigh            0.31       branching  +---------------+-----------+----------------------+---------------+-----------+      0.42                      Distal thigh          0.24                  +---------------+-----------+----------------------+---------------+-----------+      0.31                          Knee              0.18                  +---------------+-----------+----------------------+---------------+-----------+      0.38       branching       Prox calf            0.24                  +---------------+-----------+----------------------+---------------+-----------+      0.15                        Mid calf            0.30                  +---------------+-----------+----------------------+---------------+-----------+      0.11  Distal calf           0.25                  +---------------+-----------+----------------------+---------------+-----------+ Diagnosing physician: Monica Martinez MD Electronically signed by Monica Martinez MD on 10/15/2019 at 7:19:22 PM.    Final     Cardiac Studies:  Assessment/Plan:  Status  post small,Acute non-Q-wave myocardial infarction.status post left cardiac catheterization Multivesselcalcific CAD , history of PCI to LAD and RCA in the past Ischemic cardiomyopathy. Status post new onset A. Fib with RVR. Hypertension Hyperlipidemia Hypothyroidism. Morbid obesity. End-stage renal disease on hemodialysis. History of nephrotic syndrome. Degenerative joint disease Plan Reduce metoprolol tartrate to 12.5 mg twice daily Continue low-dose amiodarone Work-up for possible CABG in progress  LOS: 4 days    Charolette Forward 10/17/2019, 10:55 AM

## 2019-10-17 NOTE — Progress Notes (Signed)
Subjective:  HD yest- removed 1400- had transient low BP-  Is back to baseline now Objective Vital signs in last 24 hours: Vitals:   10/16/19 2022 10/16/19 2337 10/17/19 0354 10/17/19 0827  BP: 106/79 94/70 97/70  106/70  Pulse: 73 (!) 50 (!) 50 (!) 58  Resp:  17 11 18   Temp: (!) 97.5 F (36.4 C) 97.9 F (36.6 C) (!) 97.5 F (36.4 C) 97.6 F (36.4 C)  TempSrc: Oral Oral Oral Oral  SpO2: 100% 100% 100% 99%  Weight:      Height:       Weight change:   Intake/Output Summary (Last 24 hours) at 10/17/2019 0926 Last data filed at 10/17/2019 0800 Gross per 24 hour  Intake 873.14 ml  Output 1398 ml  Net -524.86 ml   Dialysis Orders: : [facility] MWF . EDW 102kg HD Bath 2k, 2.5ca  Time 4hr Heparin 10,000. Access R IJ Perm cath (P cath Dependant )     Calcitriol 0.52mcg  Po q hd Mirciera 50 mcg iv q 4 wks  ( last on 09/23/19)  Op hgb 10.5 (10/07/19 )   Assessment/Plan 1. Acute NonQ wave MI ( ho Multivessel  CAD sp LAD/ RCA  PCI ibn past ) - wu per Card.   Has multivessel disease - to have CTS eval- possible CABG next week  2. New onset A Fib with RVR - per cards- in NSR this AM 3. ESRD - HD MWF   via [facility]- next treatment will be Sunday due to holiday schedule  4. Hypertension/volume  - bp variable - close to edw  CXR 11/16 =NAD, CM-  UF as able  5. Anemia of ESRD= HGB 10.2-  No ESA for a while, will redose with HD tomorrow 6. Metabolic bone disease -  Po vit d on hd and binder( renvela)  with meals Ca 8.4-  7. Hypothyroidism - HO  Chronic noncompliance with Levothyroxine in past     [facility]    Labs: Basic Metabolic Panel: Recent Labs  Lab 10/15/19 0230 10/16/19 0823 10/17/19 0152  NA 135 134* 137  K 4.7 4.5 4.6  CL 97* 94* 96*  CO2 24 28 28   GLUCOSE 79 114* 99  BUN 17 26* 16  CREATININE 5.56* 6.41* 5.50*  CALCIUM 8.1* 8.0* 8.4*  PHOS  --  5.3* 5.3*   Liver Function Tests: Recent Labs  Lab 10/13/19 0154 10/16/19 0823 10/17/19 0152  AST 24  --    --   ALT 16  --   --   ALKPHOS 64  --   --   BILITOT 0.5  --   --   PROT 7.3  --   --   ALBUMIN 4.0 3.2* 3.3*   No results for input(s): LIPASE, AMYLASE in the last 168 hours. No results for input(s): AMMONIA in the last 168 hours. CBC: Recent Labs  Lab 10/14/19 0219 10/14/19 1920 10/15/19 0230 10/16/19 0524 10/17/19 0152  WBC 7.6 4.9 6.1 7.2 6.7  HGB 12.0 11.4* 11.0* 11.0* 10.2*  HCT 37.8 35.3* 34.2* 34.3* 32.5*  MCV 96.7 96.2 96.6 96.3 97.3  PLT 208 199 183 200 188   Cardiac Enzymes: No results for input(s): CKTOTAL, CKMB, CKMBINDEX, TROPONINI in the last 168 hours. CBG: No results for input(s): GLUCAP in the last 168 hours.  Iron Studies: No results for input(s): IRON, TIBC, TRANSFERRIN, FERRITIN in the last 72 hours. Studies/Results: Ct Chest Wo Contrast  Result Date: 10/16/2019 CLINICAL DATA:  Chest pain, shortness of breath EXAM: CT CHEST WITHOUT CONTRAST TECHNIQUE: Multidetector CT imaging of the chest was performed following the standard protocol without IV contrast. COMPARISON:  Chest x-ray 10/12/2019. CT 02/09/2018 FINDINGS: Cardiovascular: Right-sided hemodialysis catheter terminates within the right atrium. Mild cardiomegaly. Calcifications and or prior stenting of the coronary arteries. Thoracic aorta is nonaneurysmal. There is scattered atherosclerotic calcifications of the aorta. Vascular stents within the right subclavian/axillary region. Mediastinum/Nodes: Surgical clips in the region of the thyroid bed. No thyroid tissue is seen. No axillary, mediastinal, or hilar lymphadenopathy. Trachea and esophagus appear within normal limits. Lungs/Pleura: There are a few small scattered areas of predominantly ground-glass opacity including within the right lower lobe (series 8, image 103 and 65) and within the left upper lobe (series 8, image 59) no pleural effusion. No pneumothorax. Upper Abdomen: The visualized right kidney is atrophic with multiple cysts, incompletely  evaluated no acute findings within the visualized upper abdomen. Prominent collateral vasculature within the subcutaneous tissues of the abdominal wall. Musculoskeletal: Mild diffuse increased bone mineral density compatible with renal osteodystrophy. No acute osseous findings. IMPRESSION: 1. There are a few scattered small areas of predominantly ground-glass opacity within both lungs, nonspecific but may reflect areas of inflammation and/or infection. 2. Extensive atherosclerotic disease of the aorta and coronary arteries, advanced for age. Aortic Atherosclerosis (ICD10-I70.0). Electronically Signed   By: Davina Poke M.D.   On: 10/16/2019 19:40   Vas [place] Lower Extremity Saphenous Vein Mapping  Result Date: 10/15/2019 LOWER EXTREMITY VEIN MAPPING Indications:  CAD Risk Factors: Coronary artery disease.  Comparison Study: No prior studies. Performing Technologist: Oliver Hum RVT  Examination Guidelines: A complete evaluation includes B-mode imaging, spectral Doppler, color Doppler, and power Doppler as needed of all accessible portions of each vessel. Bilateral testing is considered an integral part of a complete examination. Limited examinations for reoccurring indications may be performed as noted. +---------------+-----------+----------------------+---------------+-----------+   RT Diameter  RT Findings         GSV            LT Diameter  LT Findings      (cm)                                            (cm)                  +---------------+-----------+----------------------+---------------+-----------+      0.73                     Saphenofemoral         0.23                                                   Junction                                  +---------------+-----------+----------------------+---------------+-----------+      0.39                     Proximal thigh         0.22                   +---------------+-----------+----------------------+---------------+-----------+  0.38                       Mid thigh            0.31       branching  +---------------+-----------+----------------------+---------------+-----------+      0.42                      Distal thigh          0.24                  +---------------+-----------+----------------------+---------------+-----------+      0.31                          Knee              0.18                  +---------------+-----------+----------------------+---------------+-----------+      0.38       branching       Prox calf            0.24                  +---------------+-----------+----------------------+---------------+-----------+      0.15                        Mid calf            0.30                  +---------------+-----------+----------------------+---------------+-----------+      0.11                      Distal calf           0.25                  +---------------+-----------+----------------------+---------------+-----------+ Diagnosing physician: Monica Martinez MD Electronically signed by Monica Martinez MD on 10/15/2019 at 7:19:22 PM.    Final    Medications: Infusions: . sodium chloride    . sodium chloride    . heparin 1,400 Units/hr (10/17/19 0800)  . sodium chloride      Scheduled Medications: . amiodarone  200 mg Oral Daily  . aspirin EC  81 mg Oral Daily  . atorvastatin  80 mg Oral q morning - 10a  . calcitRIOL  0.75 mcg Oral Q M,W,F-HD  . Chlorhexidine Gluconate Cloth  6 each Topical Daily  . Chlorhexidine Gluconate Cloth  6 each Topical Q0600  . levothyroxine  300 mcg Oral QHS  . metoprolol tartrate  25 mg Oral BID  . sevelamer carbonate  3,200 mg Oral TID WC  . sodium chloride flush  3 mL Intravenous Once  . sodium chloride flush  3 mL Intravenous Q12H  . sodium chloride flush  3 mL Intravenous Q12H    have reviewed scheduled and prn medications.  Physical  Exam: General: NAD-  Heart: irreg Lungs: mostly clear Abdomen: soft,non tender Extremities: chronic changes-   Dialysis Access: TDC     10/17/2019,9:26 AM  LOS: 4 days

## 2019-10-17 NOTE — Plan of Care (Signed)
  Problem: Education: Goal: Knowledge of General Education information will improve Description: Including pain rating scale, medication(s)/side effects and non-pharmacologic comfort measures Outcome: Progressing   Problem: Clinical Measurements: Goal: Ability to maintain clinical measurements within normal limits will improve Outcome: Progressing Goal: Will remain free from infection Outcome: Progressing Goal: Respiratory complications will improve Outcome: Progressing Goal: Cardiovascular complication will be avoided Outcome: Progressing   Problem: Nutrition: Goal: Adequate nutrition will be maintained Outcome: Progressing   Problem: Coping: Goal: Level of anxiety will decrease Outcome: Progressing

## 2019-10-17 NOTE — Progress Notes (Signed)
PROGRESS NOTE  Lori [name]  DOB: [birth year]  PCP: Patient, No Pcp Per MU:[id number]  DOA: 10/12/2019  LOS: 4 days   Chief Complaint  Patient presents with  . Chest Pain   Brief narrative: Patient is a 48 y.o. female, with h/o CAD s/p stent to LAD, hypertension, ESRD on HD MWF, hypothyroidism, hyperparathyroidism, GERD.  Patient presented to the hospital on 11/16 with chest pain, which started after she came back from  hemodialysis. Chest pain did not improve with nitroglycerin and hence came to the ED. Patient's last heart cath was done by Dr. Terrence Dupont in 2019 with placement of drug-eluting stent in LAD. In the ED initial troponin was 44, repeat troponin was 331. Dr. Roderic Palau spoke to Dr. Terrence Dupont, who recommended that patient be started on heparin, full dose anticoagulation. She was also found to be in A. fib with RVR. Patient was admitted to hospitalist medicine service.  Cardiology consultation was called. 11/18, patient underwent cardiac catheterization.  Per cardiac cath note, patient has severe heavily calcified three-vessel coronary artery disease, severely tortuous. PCI options are limited because of multiple lesions, calcification and tortuosity.  Cardiovascular consultation called to see if she is a candidate for CABG.  Subjective: Patient was seen and examined this morning.  Sitting up in chair.  Not in distress.  No chest pain.  CABG work-up in progress.  Assessment/Plan: Acute coronary syndrome Multivessel coronary disease s/p PCI to LAD and RCA in the past Ischemic cardiomyopathy -LVEF 50% -patient presented with chest pain with elevated troponin.   -11/18, cardiac cath.  Multivessel disease.  Cardiovascular surgery consultation was obtained.  Being planned for CABG.  Underwent venous mapping of the lower extremities yesterday.  Brilinta remains on hold.   -Cardiac surgery following.  Atrial fibrillation -Coreg 3.125 mg for rate control. Continue heparin drip.  ESRD on  HD, MWF -Nephrology consultation appreciated.  Regular dialysis yesterday.  Hypothyroidism-continue Synthroid  Hypertension-continue Cozaar, Coreg.  Morbid obesity - Body mass index is 40.54 kg/m. Patient has been advised to make an attempt to improve diet and exercise patterns to aid in weight loss.  Mobility: Encourage ambulation DVT prophylaxis:  Heparin drip Code Status:   Code Status: Full Code  Family Communication:  None Expected Discharge:  Pending CT surgery planning of CABG inpatient versus outpatient.  Consultants:  Cardiology, nephrology, CT surgery  Procedures:  Cardiac cath 11/18  Antimicrobials: Anti-infectives (From admission, onward)   None      Diet Order            Diet Heart Room service appropriate? Yes with Assist; Fluid consistency: Thin  Diet effective now              Infusions:  . sodium chloride    . sodium chloride    . heparin 1,400 Units/hr (10/17/19 1200)  . sodium chloride      Scheduled Meds: . amiodarone  200 mg Oral Daily  . aspirin EC  81 mg Oral Daily  . atorvastatin  80 mg Oral q morning - 10a  . calcitRIOL  0.75 mcg Oral Q M,W,F-HD  . Chlorhexidine Gluconate Cloth  6 each Topical Daily  . Chlorhexidine Gluconate Cloth  6 each Topical Q0600  . levothyroxine  300 mcg Oral QHS  . metoprolol tartrate  12.5 mg Oral BID  . sevelamer carbonate  3,200 mg Oral TID WC  . sodium chloride flush  3 mL Intravenous Once  . sodium chloride flush  3 mL Intravenous Q12H  .  sodium chloride flush  3 mL Intravenous Q12H    PRN meds: sodium chloride, sodium chloride, acetaminophen, HYDROcodone-acetaminophen, Muscle Rub, nitroGLYCERIN, ondansetron **OR** ondansetron (ZOFRAN) IV, sevelamer carbonate, sodium chloride flush, sodium chloride flush   Objective: Vitals:   10/17/19 0827 10/17/19 1237  BP: 106/70 117/88  Pulse: (!) 58 (!) 55  Resp: 18 19  Temp: 97.6 F (36.4 C) 97.7 F (36.5 C)  SpO2: 99% 95%    Intake/Output  Summary (Last 24 hours) at 10/17/2019 1313 Last data filed at 10/17/2019 1200 Gross per 24 hour  Intake 1169.14 ml  Output -  Net 1169.14 ml   Filed Weights   10/14/19 1749 10/16/19 0745 10/16/19 1205  Weight: 104.4 kg 105.3 kg 103.8 kg   Weight change:  Body mass index is 40.54 kg/m.   Physical Exam:  General exam: Appears calm and comfortable.  Sitting up in chair.  Not in distress Skin: No rashes, lesions or ulcers.  Generalized skin dryness. HEENT: Atraumatic, normocephalic, supple neck, no obvious bleeding Lungs: Clear to auscultation bilaterally CVS: Regular rate and rhythm, no murmur GI/Abd soft, nontender, nondistended, bowel sound present CNS: Alert, awake, oriented x3 Psychiatry: Mood appropriate Extremities: No pedal edema, no calf tenderness  Data Review: I have personally reviewed the laboratory data and studies available.  Recent Labs  Lab 10/14/19 0219 10/14/19 1920 10/15/19 0230 10/16/19 0524 10/17/19 0152  WBC 7.6 4.9 6.1 7.2 6.7  HGB 12.0 11.4* 11.0* 11.0* 10.2*  HCT 37.8 35.3* 34.2* 34.3* 32.5*  MCV 96.7 96.2 96.6 96.3 97.3  PLT 208 199 183 200 188   Recent Labs  Lab 10/13/19 0154 10/14/19 1141 10/14/19 1920 10/15/19 0230 10/16/19 0823 10/17/19 0152  NA 137 137  --  135 134* 137  K 4.3 6.3*  --  4.7 4.5 4.6  CL 97* 97*  --  97* 94* 96*  CO2 27 24  --  24 28 28   GLUCOSE 104* 93  --  79 114* 99  BUN 25* 40*  --  17 26* 16  CREATININE 6.73* 9.85* 4.17* 5.56* 6.41* 5.50*  CALCIUM 8.3* 8.2*  --  8.1* 8.0* 8.4*  MG  --   --   --  1.8  --   --   PHOS  --   --   --   --  5.3* 5.3*    Terrilee Croak, MD  [facility] 10/17/2019

## 2019-10-18 LAB — CBC
HCT: 32.2 % — ABNORMAL LOW (ref 36.0–46.0)
Hemoglobin: 10.2 g/dL — ABNORMAL LOW (ref 12.0–15.0)
MCH: 31.1 pg (ref 26.0–34.0)
MCHC: 31.7 g/dL (ref 30.0–36.0)
MCV: 98.2 fL (ref 80.0–100.0)
Platelets: 193 10*3/uL (ref 150–400)
RBC: 3.28 MIL/uL — ABNORMAL LOW (ref 3.87–5.11)
RDW: 14.5 % (ref 11.5–15.5)
WBC: 6.7 10*3/uL (ref 4.0–10.5)
nRBC: 0 % (ref 0.0–0.2)

## 2019-10-18 LAB — RENAL FUNCTION PANEL
Albumin: 3.1 g/dL — ABNORMAL LOW (ref 3.5–5.0)
Anion gap: 10 (ref 5–15)
BUN: 31 mg/dL — ABNORMAL HIGH (ref 6–20)
CO2: 27 mmol/L (ref 22–32)
Calcium: 8.5 mg/dL — ABNORMAL LOW (ref 8.9–10.3)
Chloride: 98 mmol/L (ref 98–111)
Creatinine, Ser: 7.56 mg/dL — ABNORMAL HIGH (ref 0.44–1.00)
GFR calc Af Amer: 7 mL/min — ABNORMAL LOW (ref >60)
GFR calc non Af Amer: 6 mL/min — ABNORMAL LOW (ref >60)
Glucose, Bld: 86 mg/dL (ref 70–99)
Phosphorus: 5.5 mg/dL — ABNORMAL HIGH (ref 2.5–4.6)
Potassium: 5.9 mmol/L — ABNORMAL HIGH (ref 3.5–5.1)
Sodium: 135 mmol/L (ref 135–145)

## 2019-10-18 LAB — HEPARIN LEVEL (UNFRACTIONATED): Heparin Unfractionated: 0.31 IU/mL (ref 0.30–0.70)

## 2019-10-18 MED ORDER — DARBEPOETIN ALFA 60 MCG/0.3ML IJ SOSY
60.0000 ug | PREFILLED_SYRINGE | Freq: Once | INTRAMUSCULAR | Status: AC
  Administered 2019-10-18: 60 ug via INTRAVENOUS

## 2019-10-18 MED ORDER — HEPARIN SODIUM (PORCINE) 1000 UNIT/ML IJ SOLN
INTRAMUSCULAR | Status: AC
  Filled 2019-10-18: qty 5

## 2019-10-18 MED ORDER — NITROGLYCERIN 0.2 MG/HR TD PT24
0.2000 mg | MEDICATED_PATCH | Freq: Every day | TRANSDERMAL | Status: DC
  Administered 2019-10-18 – 2019-10-20 (×3): 0.2 mg via TRANSDERMAL
  Filled 2019-10-18 (×5): qty 1

## 2019-10-18 MED ORDER — ACETAMINOPHEN 325 MG PO TABS
ORAL_TABLET | ORAL | Status: AC
  Filled 2019-10-18: qty 2

## 2019-10-18 MED ORDER — CALCITRIOL 0.25 MCG PO CAPS
ORAL_CAPSULE | ORAL | Status: AC
  Filled 2019-10-18: qty 3

## 2019-10-18 MED ORDER — HEPARIN SODIUM (PORCINE) 1000 UNIT/ML IJ SOLN
4.3000 mL | Freq: Once | INTRAMUSCULAR | Status: AC
  Administered 2019-10-18: 4300 [IU] via INTRAVENOUS

## 2019-10-18 MED ORDER — DARBEPOETIN ALFA 60 MCG/0.3ML IJ SOSY
PREFILLED_SYRINGE | INTRAMUSCULAR | Status: AC
  Filled 2019-10-18: qty 0.3

## 2019-10-18 NOTE — Progress Notes (Signed)
ANTICOAGULATION CONSULT NOTE - Follow Up Consult  Pharmacy Consult for Heparin Indication: atrial fibrillation and CAD, awaiting possible CABG  Allergies  Allergen Reactions  . Activase [Alteplase] Shortness Of Breath  . Bee Pollen Anaphylaxis  . Warfarin Sodium Nausea And Vomiting and Rash    Patient Measurements: Height: 5\' 3"  (160 cm) Weight: 228 lb 13.4 oz (103.8 kg) IBW/kg (Calculated) : 52.4 Heparin Dosing Weight: 77 kg  Vital Signs: Temp: 97.5 F (36.4 C) (11/22 0434) Temp Source: Oral (11/22 0434) BP: 118/75 (11/22 0434) Pulse Rate: 68 (11/22 0434)  Labs: Recent Labs    10/16/19 0524 10/16/19 0823 10/17/19 0152 10/18/19 0247 10/18/19 0801  HGB 11.0*  --  10.2* 10.2*  --   HCT 34.3*  --  32.5* 32.2*  --   PLT 200  --  188 193  --   HEPARINUNFRC 0.39  --  0.42 0.31  --   CREATININE  --  6.41* 5.50*  --  7.56*    Assessment:  48 y.o. female with Afib, s/p cath 11/18. Multivessel CAD with possible plans for CABG. Continues on IV heparin. ESRD - off schedule. Last session 11/22.    Heparin level is therapeutic (0.31) on 1400 units/hr. CBC stable. Brilinta held for possible CABG.  Last Brilinta dose 11/18 am.  Goal of Therapy:  Heparin level 0.3-0.7 units/ml Monitor platelets by anticoagulation protocol: Yes   Plan:  Continue heparin drip at 1400 units/hr Daily heparin level and CBC. Follow up plans.  Acey Lav, PharmD  PGY1 Acute Care Pharmacy Resident [phone] 10/18/2019 8:54 AM

## 2019-10-18 NOTE — Progress Notes (Signed)
Subjective:  Seen on HD-  Pre HD K was 5.9-  Was 4.6 yest -   Objective Vital signs in last 24 hours: Vitals:   10/18/19 0734 10/18/19 0800 10/18/19 0830 10/18/19 0900  BP: (!) 124/58 (!) 114/51 (!) 112/52 124/69  Pulse: (!) 49 (!) 58 (!) 57 (!) 57  Resp: 17 18 17 17   Temp:      TempSrc:      SpO2: 100% 100% 100% 100%  Weight:      Height:       Weight change:   Intake/Output Summary (Last 24 hours) at 10/18/2019 0936 Last data filed at 10/18/2019 0445 Gross per 24 hour  Intake 880 ml  Output -  Net 880 ml   Dialysis Orders: : [name] on MWF . EDW 102kg HD Bath 2k, 2.5ca  Time 4hr Heparin 10,000. Access R IJ Perm cath (P cath Dependant )     Calcitriol 0.30mcg  Po q hd Mirciera 50 mcg iv q 4 wks  ( last on 09/23/19)  Op hgb 10.5 (10/07/19 )   Assessment/Plan 1. Acute NonQ wave MI ( ho Multivessel  CAD sp LAD/ RCA  PCI ibn past ) - wu per Card.   Has multivessel disease - to have CTS eval- possible CABG next week - plan still not clear  2. New onset A Fib with RVR - per cards- in NSR this AM- amio and lopressor- kind of brady  3. ESRD - HD MWF   via [facility]- next treatment will be Tuesday due to holiday schedule -  Changed to 2 k bath today for high K  4. Hypertension/volume  - bp variable - close to edw  CXR 11/16 =NAD, CM-  UF as able  5. Anemia of ESRD= HGB 10.2-  No ESA for a while, will redose with HD today 6. Metabolic bone disease -  Po vit d on hd and binder( renvela)  with meals Ca 8.4-  7. Hypothyroidism - HO  Chronic noncompliance with Levothyroxine in past     Louis Meckel    Labs: Basic Metabolic Panel: Recent Labs  Lab 10/16/19 0823 10/17/19 0152 10/18/19 0801  NA 134* 137 135  K 4.5 4.6 5.9*  CL 94* 96* 98  CO2 28 28 27   GLUCOSE 114* 99 86  BUN 26* 16 31*  CREATININE 6.41* 5.50* 7.56*  CALCIUM 8.0* 8.4* 8.5*  PHOS 5.3* 5.3* 5.5*   Liver Function Tests: Recent Labs  Lab 10/13/19 0154 10/16/19 0823 10/17/19 0152 10/18/19 0801  AST  24  --   --   --   ALT 16  --   --   --   ALKPHOS 64  --   --   --   BILITOT 0.5  --   --   --   PROT 7.3  --   --   --   ALBUMIN 4.0 3.2* 3.3* 3.1*   No results for input(s): LIPASE, AMYLASE in the last 168 hours. No results for input(s): AMMONIA in the last 168 hours. CBC: Recent Labs  Lab 10/14/19 1920 10/15/19 0230 10/16/19 0524 10/17/19 0152 10/18/19 0247  WBC 4.9 6.1 7.2 6.7 6.7  HGB 11.4* 11.0* 11.0* 10.2* 10.2*  HCT 35.3* 34.2* 34.3* 32.5* 32.2*  MCV 96.2 96.6 96.3 97.3 98.2  PLT 199 183 200 188 193   Cardiac Enzymes: No results for input(s): CKTOTAL, CKMB, CKMBINDEX, TROPONINI in the last 168 hours. CBG: No results for input(s): GLUCAP in the last  168 hours.  Iron Studies: No results for input(s): IRON, TIBC, TRANSFERRIN, FERRITIN in the last 72 hours. Studies/Results: Ct Chest Wo Contrast  Result Date: 10/16/2019 CLINICAL DATA:  Chest pain, shortness of breath EXAM: CT CHEST WITHOUT CONTRAST TECHNIQUE: Multidetector CT imaging of the chest was performed following the standard protocol without IV contrast. COMPARISON:  Chest x-ray 10/12/2019. CT 02/09/2018 FINDINGS: Cardiovascular: Right-sided hemodialysis catheter terminates within the right atrium. Mild cardiomegaly. Calcifications and or prior stenting of the coronary arteries. Thoracic aorta is nonaneurysmal. There is scattered atherosclerotic calcifications of the aorta. Vascular stents within the right subclavian/axillary region. Mediastinum/Nodes: Surgical clips in the region of the thyroid bed. No thyroid tissue is seen. No axillary, mediastinal, or hilar lymphadenopathy. Trachea and esophagus appear within normal limits. Lungs/Pleura: There are a few small scattered areas of predominantly ground-glass opacity including within the right lower lobe (series 8, image 103 and 65) and within the left upper lobe (series 8, image 59) no pleural effusion. No pneumothorax. Upper Abdomen: The visualized right kidney is  atrophic with multiple cysts, incompletely evaluated no acute findings within the visualized upper abdomen. Prominent collateral vasculature within the subcutaneous tissues of the abdominal wall. Musculoskeletal: Mild diffuse increased bone mineral density compatible with renal osteodystrophy. No acute osseous findings. IMPRESSION: 1. There are a few scattered small areas of predominantly ground-glass opacity within both lungs, nonspecific but may reflect areas of inflammation and/or infection. 2. Extensive atherosclerotic disease of the aorta and coronary arteries, advanced for age. Aortic Atherosclerosis (ICD10-I70.0). Electronically Signed   By: Davina Poke M.D.   On: 10/16/2019 19:40   Medications: Infusions: . sodium chloride    . sodium chloride    . heparin 1,400 Units/hr (10/18/19 0229)  . sodium chloride      Scheduled Medications: . amiodarone  200 mg Oral Daily  . aspirin EC  81 mg Oral Daily  . atorvastatin  80 mg Oral q morning - 10a  . calcitRIOL  0.75 mcg Oral Q M,W,F-HD  . Chlorhexidine Gluconate Cloth  6 each Topical Daily  . Chlorhexidine Gluconate Cloth  6 each Topical Q0600  . levothyroxine  300 mcg Oral QHS  . metoprolol tartrate  12.5 mg Oral BID  . sevelamer carbonate  3,200 mg Oral TID WC  . sodium chloride flush  3 mL Intravenous Once  . sodium chloride flush  3 mL Intravenous Q12H  . sodium chloride flush  3 mL Intravenous Q12H    have reviewed scheduled and prn medications.  Physical Exam: General: NAD-  Heart: irreg- slow  Lungs: mostly clear Abdomen: soft,non tender Extremities: chronic changes-   Dialysis Access: TDC     10/18/2019,9:36 AM  LOS: 5 days

## 2019-10-18 NOTE — Progress Notes (Signed)
Subjective:  Patient denies any chest pain or shortness of breath.  Tolerating hemodialysis okay.  Occasional episode of marked sinus bradycardia on the monitor  Objective:  Vital Signs in the last 24 hours: Temp:  [97.5 F (36.4 C)-97.7 F (36.5 C)] 97.7 F (36.5 C) (11/22 0720) Pulse Rate:  [43-68] 55 (11/22 0930) Resp:  [12-19] 18 (11/22 0930) BP: (79-131)/(51-88) 101/54 (11/22 0930) SpO2:  [95 %-100 %] 100 % (11/22 0930) Weight:  [105 kg] 105 kg (11/22 0720)  Intake/Output from previous day: 11/21 0701 - 11/22 0700 In: 1268 [P.O.:960; I.V.:308] Out: -  Intake/Output from this shift: No intake/output data recorded.  Physical Exam: Neck: no adenopathy, no carotid bruit, no JVD and supple, symmetrical, trachea midline Lungs: clear to auscultation bilaterally Heart: bradycardic, S1, S2, soft.  There is soft systolic murmur.  No S3 gallop Abdomen: soft, non-tender; bowel sounds normal; no masses,  no organomegaly Extremities: no clubbing, cyanosis, 1+ edema noted  Lab Results: Recent Labs    10/17/19 0152 10/18/19 0247  WBC 6.7 6.7  HGB 10.2* 10.2*  PLT 188 193   Recent Labs    10/17/19 0152 10/18/19 0801  NA 137 135  K 4.6 5.9*  CL 96* 98  CO2 28 27  GLUCOSE 99 86  BUN 16 31*  CREATININE 5.50* 7.56*   No results for input(s): TROPONINI in the last 72 hours.  Invalid input(s): CK, MB Hepatic Function Panel Recent Labs    10/18/19 0801  ALBUMIN 3.1*   No results for input(s): CHOL in the last 72 hours. No results for input(s): PROTIME in the last 72 hours.  Imaging: Imaging results have been reviewed and Ct Chest Wo Contrast  Result Date: 10/16/2019 CLINICAL DATA:  Chest pain, shortness of breath EXAM: CT CHEST WITHOUT CONTRAST TECHNIQUE: Multidetector CT imaging of the chest was performed following the standard protocol without IV contrast. COMPARISON:  Chest x-ray 10/12/2019. CT 02/09/2018 FINDINGS: Cardiovascular: Right-sided hemodialysis catheter  terminates within the right atrium. Mild cardiomegaly. Calcifications and or prior stenting of the coronary arteries. Thoracic aorta is nonaneurysmal. There is scattered atherosclerotic calcifications of the aorta. Vascular stents within the right subclavian/axillary region. Mediastinum/Nodes: Surgical clips in the region of the thyroid bed. No thyroid tissue is seen. No axillary, mediastinal, or hilar lymphadenopathy. Trachea and esophagus appear within normal limits. Lungs/Pleura: There are a few small scattered areas of predominantly ground-glass opacity including within the right lower lobe (series 8, image 103 and 65) and within the left upper lobe (series 8, image 59) no pleural effusion. No pneumothorax. Upper Abdomen: The visualized right kidney is atrophic with multiple cysts, incompletely evaluated no acute findings within the visualized upper abdomen. Prominent collateral vasculature within the subcutaneous tissues of the abdominal wall. Musculoskeletal: Mild diffuse increased bone mineral density compatible with renal osteodystrophy. No acute osseous findings. IMPRESSION: 1. There are a few scattered small areas of predominantly ground-glass opacity within both lungs, nonspecific but may reflect areas of inflammation and/or infection. 2. Extensive atherosclerotic disease of the aorta and coronary arteries, advanced for age. Aortic Atherosclerosis (ICD10-I70.0). Electronically Signed   By: Davina Poke M.D.   On: 10/16/2019 19:40    Cardiac Studies:  Assessment/Plan:  Status post small,Acute non-Q-wave myocardial infarction.status post left cardiac catheterization Multivesselcalcific CAD , history of PCI to LAD and RCA in the past Ischemic cardiomyopathy. Status post new onset A. Fib with RVR. Hypertension Hyperlipidemia Hypothyroidism. Morbid obesity. End-stage renal disease on hemodialysis. History of nephrotic syndrome. Degenerative joint disease Plan  DC metoprolol in view of  marked sinus bradycardia. Add Nitro-Dur patch as per orders  LOS: 5 days    Charolette Forward 10/18/2019, 10:02 AM

## 2019-10-18 NOTE — Procedures (Signed)
Patient was seen on dialysis and the procedure was supervised.  BFR 400  Via TDC BP is  101/54.   Patient appears to be tolerating treatment well  Louis Meckel 10/18/2019

## 2019-10-18 NOTE — Progress Notes (Signed)
PROGRESS NOTE  Lori [name]  DOB: 20-Jan-1971  PCP: Patient, No Pcp Per [id number]  DOA: 10/12/2019  LOS: 5 days   Chief Complaint  Patient presents with  . Chest Pain   Brief narrative: Patient is a 48 y.o. female, with h/o CAD s/p stent to LAD, hypertension, ESRD on HD MWF, hypothyroidism, hyperparathyroidism, GERD.  Patient presented to the hospital on 11/16 with chest pain, which started after she came back from  hemodialysis. Chest pain did not improve with nitroglycerin and hence came to the ED. Patient's last heart cath was done by Dr. Terrence Dupont in 2019 with placement of drug-eluting stent in LAD. In the ED initial troponin was 44, repeat troponin was 331. Dr. Roderic Palau spoke to Dr. Terrence Dupont, who recommended that patient be started on heparin, full dose anticoagulation. She was also found to be in A. fib with RVR. Patient was admitted to hospitalist medicine service.  Cardiology consultation was called. 11/18, patient underwent cardiac catheterization.  Per cardiac cath note, patient has severe heavily calcified three-vessel coronary artery disease, severely tortuous. PCI options are limited because of multiple lesions, calcification and tortuosity.  Cardiovascular consultation called to see if she is a candidate for CABG.  Subjective: Patient was seen and examined this morning in dialysis.  Not in distress. CABG work-up in progress.  Assessment/Plan: Acute coronary syndrome Multivessel coronary disease s/p PCI to LAD and RCA in the past Ischemic cardiomyopathy -LVEF 50% -patient presented with chest pain with elevated troponin.   -11/18, cardiac cath.  Multivessel disease.  Cardiovascular surgery consultation was obtained. Being planned for CABG.  Underwent venous mapping of the lower extremities yesterday. Brilinta remains on hold.   -Cardiac surgery following.  Atrial fibrillation -On amiodarone for rate control.  Metoprolol was stopped by cardiologist because of bradycardia.   Continue heparin drip.  ESRD on HD, MWF -Nephrology consultation appreciated.  Regular dialysis yesterday.  Hypothyroidism-continue Synthroid  Hypertension-continue Cozaar, Coreg.  Morbid obesity - Body mass index is 40.42 kg/m. Patient has been advised to make an attempt to improve diet and exercise patterns to aid in weight loss.  Mobility: Encourage ambulation DVT prophylaxis:  Heparin drip Code Status:   Code Status: Full Code  Family Communication:  None Expected Discharge:  Pending CT surgery planning of CABG inpatient versus outpatient.  Consultants:  Cardiology, nephrology, CT surgery  Procedures:  Cardiac cath 11/18  Antimicrobials: Anti-infectives (From admission, onward)   None      Diet Order            Diet Heart Room service appropriate? Yes with Assist; Fluid consistency: Thin  Diet effective now              Infusions:  . sodium chloride    . sodium chloride    . heparin 1,400 Units/hr (10/18/19 0229)  . sodium chloride      Scheduled Meds: . amiodarone  200 mg Oral Daily  . aspirin EC  81 mg Oral Daily  . atorvastatin  80 mg Oral q morning - 10a  . calcitRIOL  0.75 mcg Oral Q M,W,F-HD  . Chlorhexidine Gluconate Cloth  6 each Topical Daily  . Chlorhexidine Gluconate Cloth  6 each Topical Q0600  . levothyroxine  300 mcg Oral QHS  . nitroGLYCERIN  0.2 mg Transdermal Daily  . sevelamer carbonate  3,200 mg Oral TID WC  . sodium chloride flush  3 mL Intravenous Once  . sodium chloride flush  3 mL Intravenous Q12H  . sodium chloride  flush  3 mL Intravenous Q12H    PRN meds: sodium chloride, sodium chloride, acetaminophen, HYDROcodone-acetaminophen, Muscle Rub, nitroGLYCERIN, ondansetron **OR** ondansetron (ZOFRAN) IV, sevelamer carbonate, sodium chloride flush, sodium chloride flush   Objective: Vitals:   10/18/19 1129 10/18/19 1228  BP: 107/61 112/87  Pulse: (!) 56   Resp: 18   Temp: 97.6 F (36.4 C) 97.6 F (36.4 C)  SpO2: 100%      Intake/Output Summary (Last 24 hours) at 10/18/2019 1237 Last data filed at 10/18/2019 1129 Gross per 24 hour  Intake 584 ml  Output 1500 ml  Net -916 ml   Filed Weights   10/16/19 1205 10/18/19 0720 10/18/19 1129  Weight: 103.8 kg 105 kg 103.5 kg   Weight change:  Body mass index is 40.42 kg/m.   Physical Exam:  General exam: Appears calm and comfortable.  Sitting up in chair.  Not in distress Skin: No rashes, lesions or ulcers.  Generalized skin dryness. HEENT: Atraumatic, normocephalic, supple neck, no obvious bleeding Lungs: Clear to auscultation bilaterally CVS: Regular rate and rhythm, no murmur GI/Abd soft, nontender, nondistended, bowel sound present CNS: Alert, awake, oriented x3 Psychiatry: Mood appropriate Extremities: No pedal edema, no calf tenderness  Data Review: I have personally reviewed the laboratory data and studies available.  Recent Labs  Lab 10/14/19 1920 10/15/19 0230 10/16/19 0524 10/17/19 0152 10/18/19 0247  WBC 4.9 6.1 7.2 6.7 6.7  HGB 11.4* 11.0* 11.0* 10.2* 10.2*  HCT 35.3* 34.2* 34.3* 32.5* 32.2*  MCV 96.2 96.6 96.3 97.3 98.2  PLT 199 183 200 188 193   Recent Labs  Lab 10/14/19 1141 10/14/19 1920 10/15/19 0230 10/16/19 0823 10/17/19 0152 10/18/19 0801  NA 137  --  135 134* 137 135  K 6.3*  --  4.7 4.5 4.6 5.9*  CL 97*  --  97* 94* 96* 98  CO2 24  --  24 28 28 27   GLUCOSE 93  --  79 114* 99 86  BUN 40*  --  17 26* 16 31*  CREATININE 9.85* 4.17* 5.56* 6.41* 5.50* 7.56*  CALCIUM 8.2*  --  8.1* 8.0* 8.4* 8.5*  MG  --   --  1.8  --   --   --   PHOS  --   --   --  5.3* 5.3* 5.5*    Terrilee Croak, MD  [facility] 10/18/2019

## 2019-10-19 LAB — HEPARIN LEVEL (UNFRACTIONATED)
Heparin Unfractionated: 0.19 IU/mL — ABNORMAL LOW (ref 0.30–0.70)
Heparin Unfractionated: 0.22 IU/mL — ABNORMAL LOW (ref 0.30–0.70)
Heparin Unfractionated: 0.31 IU/mL (ref 0.30–0.70)

## 2019-10-19 LAB — CBC
HCT: 30.8 % — ABNORMAL LOW (ref 36.0–46.0)
Hemoglobin: 9.9 g/dL — ABNORMAL LOW (ref 12.0–15.0)
MCH: 30.7 pg (ref 26.0–34.0)
MCHC: 32.1 g/dL (ref 30.0–36.0)
MCV: 95.4 fL (ref 80.0–100.0)
Platelets: 176 10*3/uL (ref 150–400)
RBC: 3.23 MIL/uL — ABNORMAL LOW (ref 3.87–5.11)
RDW: 14.3 % (ref 11.5–15.5)
WBC: 5.8 10*3/uL (ref 4.0–10.5)
nRBC: 0 % (ref 0.0–0.2)

## 2019-10-19 MED ORDER — CHLORHEXIDINE GLUCONATE CLOTH 2 % EX PADS
6.0000 | MEDICATED_PAD | Freq: Every day | CUTANEOUS | Status: DC
  Administered 2019-10-21 – 2019-10-22 (×2): 6 via TOPICAL

## 2019-10-19 MED ORDER — CYCLOBENZAPRINE HCL 10 MG PO TABS
5.0000 mg | ORAL_TABLET | Freq: Once | ORAL | Status: DC
  Filled 2019-10-19: qty 1

## 2019-10-19 MED ORDER — MAGNESIUM SULFATE IN D5W 1-5 GM/100ML-% IV SOLN
1.0000 g | Freq: Once | INTRAVENOUS | Status: AC
  Administered 2019-10-19: 22:00:00 1 g via INTRAVENOUS
  Filled 2019-10-19: qty 100

## 2019-10-19 NOTE — Progress Notes (Signed)
Subjective:  Seen in room, no distress, no c/o   Objective Vital signs in last 24 hours: Vitals:   10/18/19 2325 10/19/19 0326 10/19/19 0747 10/19/19 1200  BP: 115/83 130/75 135/85 (!) 126/98  Pulse: 62 66 62 62  Resp: 10  12 (!) 24  Temp: 97.8 F (36.6 C) 98 F (36.7 C) 97.9 F (36.6 C) 97.6 F (36.4 C)  TempSrc: Oral Oral Oral Oral  SpO2: 90% 99% 100% 100%  Weight:      Height:       Weight change:   Intake/Output Summary (Last 24 hours) at 10/19/2019 1203 Last data filed at 10/19/2019 0800 Gross per 24 hour  Intake 511.38 ml  Output -  Net 511.38 ml   Physical Exam: General: NAD-  Heart: irreg- slow  Lungs: mostly clear Abdomen: soft,non tender Extremities: chronic changes, no edema Dialysis Access: [facility]    Dialysis: Joneen Caraway [facility] MWF   4h  102kg  2/2.5 bath  Hep 10,000  RIJ TDC  (P cath Dependant )     Calcitriol 0.71mcg  Po q hd Mirciera 50 mcg iv q 4 wks  ( last on 09/23/19)  Op hgb 10.5 (10/07/19 )   Assessment/Plan 1. Acute NonQ wave MI ( ho Multivessel  CAD sp LAD/ RCA  PCI in past.   Has multivessel disease. CTS evaluating.  2. New A Fib with RVR -  per cards, amio and lopressor 3. ESRD - HD MWF via [facility]. HD tomorrow, holiday sched  4. Hypertension/volume  - bp variable, 1-2kg up today, stable resp 5. Anemia of ESRD= HGB 10.2-  No ESA for a while, will redose with HD today 6. Metabolic bone disease -  Po vit d on hd and binder( renvela)  with meals Ca 8.4-  7. Hypothyroidism - HO  Chronic noncompliance with Levothyroxine in past    Kelly Splinter, MD 10/19/2019, 12:04 PM       Labs: Basic Metabolic Panel: Recent Labs  Lab 10/16/19 0823 10/17/19 0152 10/18/19 0801  NA 134* 137 135  K 4.5 4.6 5.9*  CL 94* 96* 98  CO2 28 28 27   GLUCOSE 114* 99 86  BUN 26* 16 31*  CREATININE 6.41* 5.50* 7.56*  CALCIUM 8.0* 8.4* 8.5*  PHOS 5.3* 5.3* 5.5*   Liver Function Tests: Recent Labs  Lab 10/13/19 0154 10/16/19 0823 10/17/19 0152 10/18/19 0801  AST  24  --   --   --   ALT 16  --   --   --   ALKPHOS 64  --   --   --   BILITOT 0.5  --   --   --   PROT 7.3  --   --   --   ALBUMIN 4.0 3.2* 3.3* 3.1*   No results for input(s): LIPASE, AMYLASE in the last 168 hours. No results for input(s): AMMONIA in the last 168 hours. CBC: Recent Labs  Lab 10/15/19 0230 10/16/19 0524 10/17/19 0152 10/18/19 0247 10/19/19 0222  WBC 6.1 7.2 6.7 6.7 5.8  HGB 11.0* 11.0* 10.2* 10.2* 9.9*  HCT 34.2* 34.3* 32.5* 32.2* 30.8*  MCV 96.6 96.3 97.3 98.2 95.4  PLT 183 200 188 193 176   Cardiac Enzymes: No results for input(s): CKTOTAL, CKMB, CKMBINDEX, TROPONINI in the last 168 hours. CBG: No results for input(s): GLUCAP in the last 168 hours.  Iron Studies: No results for input(s): IRON, TIBC, TRANSFERRIN, FERRITIN in the last 72 hours. Studies/Results: No results found. Medications: Infusions: . sodium  chloride    . sodium chloride    . heparin 1,550 Units/hr (10/19/19 0325)  . sodium chloride      Scheduled Medications: . amiodarone  200 mg Oral Daily  . aspirin EC  81 mg Oral Daily  . atorvastatin  80 mg Oral q morning - 10a  . calcitRIOL  0.75 mcg Oral Q M,W,F-HD  . Chlorhexidine Gluconate Cloth  6 each Topical Daily  . Chlorhexidine Gluconate Cloth  6 each Topical Q0600  . levothyroxine  300 mcg Oral QHS  . nitroGLYCERIN  0.2 mg Transdermal Daily  . sevelamer carbonate  3,200 mg Oral TID WC  . sodium chloride flush  3 mL Intravenous Once  . sodium chloride flush  3 mL Intravenous Q12H  . sodium chloride flush  3 mL Intravenous Q12H    have reviewed scheduled and prn medications.     10/19/2019,12:03 PM  LOS: 6 days

## 2019-10-19 NOTE — Progress Notes (Signed)
PROGRESS NOTE  Lori [name]  DOB: [birth year]  PCP: Patient, No Pcp Per MU:[id number]  DOA: 10/12/2019  LOS: 6 days   Chief Complaint  Patient presents with  . Chest Pain   Brief narrative: Patient is a 48 y.o. female, with h/o CAD s/p stent to LAD, hypertension, ESRD on HD MWF, hypothyroidism, hyperparathyroidism, GERD.  Patient presented to the hospital on 11/16 with chest pain, which started after she came back from  hemodialysis. Chest pain did not improve with nitroglycerin and hence came to the ED. Patient's last heart cath was done by Dr. Terrence Dupont in 2019 with placement of drug-eluting stent in LAD. In the ED initial troponin was 44, repeat troponin was 331. Dr. Roderic Palau spoke to Dr. Terrence Dupont, who recommended that patient be started on heparin, full dose anticoagulation. She was also found to be in A. fib with RVR. Patient was admitted to hospitalist medicine service.  Cardiology consultation was called. 11/18, patient underwent cardiac catheterization.  Per cardiac cath note, patient has severe heavily calcified three-vessel coronary artery disease, severely tortuous. PCI options are limited because of multiple lesions, calcification and tortuosity.  Cardiovascular consultation called to see if she is a candidate for CABG.  Subjective: Patient was seen and examined this morning.  Not in distress.  CABG work-up in progress.  Expecting to be seen by CT surgery today.  Assessment/Plan: Acute coronary syndrome Multivessel coronary disease s/p PCI to LAD and RCA in the past Ischemic cardiomyopathy -LVEF 50% -patient presented with chest pain with elevated troponin.   -11/18, cardiac cath.  Multivessel disease.  Cardiovascular surgery consultation was obtained. Being planned for CABG.  Underwent venous mapping of the lower extremities yesterday. Brilinta remains on hold.   -Cardiac surgery following.  Atrial fibrillation -On amiodarone for rate control.  Metoprolol was stopped by  cardiologist because of bradycardia.  Continue heparin drip.  ESRD on HD, MWF as well as holiday scheduled. -Nephrology consultation appreciated.  Hypothyroidism-continue Synthroid  Hypertension-continue Cozaar, Coreg.  Morbid obesity - Body mass index is 40.42 kg/m. Patient has been advised to make an attempt to improve diet and exercise patterns to aid in weight loss.  Mobility: Encourage ambulation DVT prophylaxis:  Heparin drip Code Status:   Code Status: Full Code  Family Communication:  None Expected Discharge:  Pending CT surgery planning of CABG inpatient versus outpatient.  Consultants:  Cardiology, nephrology, CT surgery  Procedures:  Cardiac cath 11/18  Antimicrobials: Anti-infectives (From admission, onward)   None      Diet Order            Diet Heart Room service appropriate? Yes with Assist; Fluid consistency: Thin  Diet effective now              Infusions:  . sodium chloride    . sodium chloride    . heparin 1,550 Units/hr (10/19/19 0325)  . sodium chloride      Scheduled Meds: . amiodarone  200 mg Oral Daily  . aspirin EC  81 mg Oral Daily  . atorvastatin  80 mg Oral q morning - 10a  . calcitRIOL  0.75 mcg Oral Q M,W,F-HD  . Chlorhexidine Gluconate Cloth  6 each Topical Daily  . Chlorhexidine Gluconate Cloth  6 each Topical Q0600  . levothyroxine  300 mcg Oral QHS  . nitroGLYCERIN  0.2 mg Transdermal Daily  . sevelamer carbonate  3,200 mg Oral TID WC  . sodium chloride flush  3 mL Intravenous Once  . sodium chloride flush  3 mL Intravenous Q12H  . sodium chloride flush  3 mL Intravenous Q12H    PRN meds: sodium chloride, sodium chloride, acetaminophen, HYDROcodone-acetaminophen, Muscle Rub, nitroGLYCERIN, ondansetron **OR** ondansetron (ZOFRAN) IV, sevelamer carbonate, sodium chloride flush, sodium chloride flush   Objective: Vitals:   10/19/19 0326 10/19/19 0747  BP: 130/75 135/85  Pulse: 66 62  Resp:  12  Temp: 98 F (36.7  C) 97.9 F (36.6 C)  SpO2: 99% 100%    Intake/Output Summary (Last 24 hours) at 10/19/2019 1022 Last data filed at 10/19/2019 0800 Gross per 24 hour  Intake 743.38 ml  Output 1500 ml  Net -756.62 ml   Filed Weights   10/16/19 1205 10/18/19 0720 10/18/19 1129  Weight: 103.8 kg 105 kg 103.5 kg   Weight change:  Body mass index is 40.42 kg/m.   Physical Exam:  General exam: Appears calm and comfortable.  Sitting up in chair.  Not in distress Skin: No rashes, lesions or ulcers.  Generalized skin dryness. HEENT: Atraumatic, normocephalic, supple neck, no obvious bleeding Lungs: Clear to auscultation bilaterally CVS: Regular rate and rhythm, no murmur GI/Abd soft, nontender, nondistended, bowel sound present CNS: Alert, awake, oriented x3 Psychiatry: Mood appropriate Extremities: No pedal edema, no calf tenderness  Data Review: I have personally reviewed the laboratory data and studies available.  Recent Labs  Lab 10/15/19 0230 10/16/19 0524 10/17/19 0152 10/18/19 0247 10/19/19 0222  WBC 6.1 7.2 6.7 6.7 5.8  HGB 11.0* 11.0* 10.2* 10.2* 9.9*  HCT 34.2* 34.3* 32.5* 32.2* 30.8*  MCV 96.6 96.3 97.3 98.2 95.4  PLT 183 200 188 193 176   Recent Labs  Lab 10/14/19 1141 10/14/19 1920 10/15/19 0230 10/16/19 0823 10/17/19 0152 10/18/19 0801  NA 137  --  135 134* 137 135  K 6.3*  --  4.7 4.5 4.6 5.9*  CL 97*  --  97* 94* 96* 98  CO2 24  --  24 28 28 27   GLUCOSE 93  --  79 114* 99 86  BUN 40*  --  17 26* 16 31*  CREATININE 9.85* 4.17* 5.56* 6.41* 5.50* 7.56*  CALCIUM 8.2*  --  8.1* 8.0* 8.4* 8.5*  MG  --   --  1.8  --   --   --   PHOS  --   --   --  5.3* 5.3* 5.5*    Terrilee Croak, MD  [facility] 10/19/2019

## 2019-10-19 NOTE — Progress Notes (Signed)
ANTICOAGULATION CONSULT NOTE - Follow Up Consult  Pharmacy Consult for Heparin Indication: atrial fibrillation and CAD, awaiting possible CABG  Allergies  Allergen Reactions  . Activase [Alteplase] Shortness Of Breath  . Bee Pollen Anaphylaxis  . Warfarin Sodium Nausea And Vomiting and Rash    Patient Measurements: Height: 5\' 3"  (160 cm) Weight: 228 lb 2.8 oz (103.5 kg) IBW/kg (Calculated) : 52.4 Heparin Dosing Weight: 77 kg  Vital Signs: Temp: 97.8 F (36.6 C) (11/22 2325) Temp Source: Oral (11/22 2325) BP: 115/83 (11/22 2325) Pulse Rate: 62 (11/22 2325)  Labs: Recent Labs    10/16/19 0823 10/17/19 0152 10/18/19 0247 10/18/19 0801 10/19/19 0222  HGB  --  10.2* 10.2*  --  9.9*  HCT  --  32.5* 32.2*  --  30.8*  PLT  --  188 193  --  176  HEPARINUNFRC  --  0.42 0.31  --  0.19*  CREATININE 6.41* 5.50*  --  7.56*  --     Assessment:  48 y.o. female with Afib, s/p cath 11/18. Multivessel CAD with possible plans for CABG. Continues on IV heparin. ESRD - off schedule. Last session 11/22.    Heparin level is therapeutic (0.31) on 1400 units/hr. CBC stable. Brilinta held for possible CABG.  Last Brilinta dose 11/18 am.  11/23 AM update: heparin level low, no issues per RN  Goal of Therapy:  Heparin level 0.3-0.7 units/ml Monitor platelets by anticoagulation protocol: Yes   Plan:  Inc heparin to 1550 units/hr Re-check heparin level in 8 hours  Narda Bonds, PharmD, [place] Pharmacist Phone: [phone]

## 2019-10-19 NOTE — Progress Notes (Signed)
ANTICOAGULATION CONSULT NOTE - Follow Up Consult  Pharmacy Consult for Heparin Indication: atrial fibrillation and CAD, awaiting possible CABG  Allergies  Allergen Reactions  . Activase [Alteplase] Shortness Of Breath  . Bee Pollen Anaphylaxis  . Warfarin Sodium Nausea And Vomiting and Rash    Patient Measurements: Height: 5\' 3"  (160 cm) Weight: 228 lb 2.8 oz (103.5 kg) IBW/kg (Calculated) : 52.4 Heparin Dosing Weight: 77 kg  Vital Signs: Temp: 97.6 F (36.4 C) (11/23 1200) Temp Source: Oral (11/23 1200) BP: 126/98 (11/23 1200) Pulse Rate: 62 (11/23 1200)  Labs: Recent Labs    10/17/19 0152 10/18/19 0247 10/18/19 0801 10/19/19 0222 10/19/19 1205  HGB 10.2* 10.2*  --  9.9*  --   HCT 32.5* 32.2*  --  30.8*  --   PLT 188 193  --  176  --   HEPARINUNFRC 0.42 0.31  --  0.19* 0.22*  CREATININE 5.50*  --  7.56*  --   --     Assessment:  48 y.o. female with Afib, s/p cath 11/18. Multivessel CAD with possible plans for CABG. Continues on IV heparin.  -heparin level= 0.22 on 1550 units/hr   Goal of Therapy:  Heparin level 0.3-0.7 units/ml Monitor platelets by anticoagulation protocol: Yes   Plan:  Inc heparin to 1799 units/hr Re-check heparin level in 8 hours  Hildred Laser, PharmD Clinical Pharmacist **Pharmacist phone directory can now be found on [email or web address] ([email or web address]).  Listed under [place].

## 2019-10-19 NOTE — Progress Notes (Signed)
ANTICOAGULATION CONSULT NOTE - Follow Up Consult  Pharmacy Consult for Heparin Indication: atrial fibrillation and CAD, awaiting possible CABG  Allergies  Allergen Reactions  . Activase [Alteplase] Shortness Of Breath  . Bee Pollen Anaphylaxis  . Warfarin Sodium Nausea And Vomiting and Rash    Patient Measurements: Height: 5\' 3"  (160 cm) Weight: 228 lb 2.8 oz (103.5 kg) IBW/kg (Calculated) : 52.4 Heparin Dosing Weight: 77 kg  Vital Signs: Temp: 98.1 F (36.7 C) (11/23 2016) Temp Source: Oral (11/23 2016) BP: 136/84 (11/23 2016) Pulse Rate: 62 (11/23 2016)  Labs: Recent Labs    10/17/19 0152 10/18/19 0247 10/18/19 0801 10/19/19 0222 10/19/19 1205 10/19/19 2210  HGB 10.2* 10.2*  --  9.9*  --   --   HCT 32.5* 32.2*  --  30.8*  --   --   PLT 188 193  --  176  --   --   HEPARINUNFRC 0.42 0.31  --  0.19* 0.22* 0.31  CREATININE 5.50*  --  7.56*  --   --   --     Assessment:  48 y.o. female with Afib, s/p cath 11/18. Multivessel CAD with possible plans for CABG. Continues on IV heparin.   Heparin level lower end therapeutic s/p rate increase to 1700 units/hr  Goal of Therapy:  Heparin level 0.3-0.7 units/ml Monitor platelets by anticoagulation protocol: Yes   Plan:  Increase heparin gtt to 1750 units/hr F/u with AM labs  Bertis Ruddy, PharmD Clinical Pharmacist Please check AMION for all [place] numbers 10/19/2019 10:46 PM

## 2019-10-19 NOTE — Plan of Care (Signed)

## 2019-10-19 NOTE — Plan of Care (Signed)
  Problem: Education: Goal: Knowledge of General Education information will improve Description: Including pain rating scale, medication(s)/side effects and non-pharmacologic comfort measures Outcome: Progressing   Problem: Clinical Measurements: Goal: Ability to maintain clinical measurements within normal limits will improve Outcome: Progressing Goal: Will remain free from infection Outcome: Progressing Goal: Respiratory complications will improve Outcome: Progressing   Problem: Activity: Goal: Risk for activity intolerance will decrease Outcome: Progressing   

## 2019-10-19 NOTE — Progress Notes (Signed)
Subjective:  Denies any chest pain or shortness of breath.   Objective:  Vital Signs in the last 24 hours: Temp:  [97.5 F (36.4 C)-98 F (36.7 C)] 97.9 F (36.6 C) (11/23 0747) Pulse Rate:  [60-66] 62 (11/23 0747) Resp:  [10-18] 12 (11/23 0747) BP: (91-135)/(52-87) 135/85 (11/23 0747) SpO2:  [90 %-100 %] 100 % (11/23 0747)  Intake/Output from previous day: 11/22 0701 - 11/23 0700 In: 623.4 [P.O.:240; I.V.:383.4] Out: 1500  Intake/Output from this shift: Total I/O In: 120 [P.O.:120] Out: -   Physical Exam: Neck: no adenopathy, no carotid bruit, no JVD and supple, symmetrical, trachea midline Lungs: clear to auscultation bilaterally Heart: bradycardic, S1, S2.  Soft systolic murmur.  No S3 gallop noted Abdomen: soft, non-tender; bowel sounds normal; no masses,  no organomegaly Extremities: extremities normal, atraumatic, no cyanosis or edema  Lab Results: Recent Labs    10/18/19 0247 10/19/19 0222  WBC 6.7 5.8  HGB 10.2* 9.9*  PLT 193 176   Recent Labs    10/17/19 0152 10/18/19 0801  NA 137 135  K 4.6 5.9*  CL 96* 98  CO2 28 27  GLUCOSE 99 86  BUN 16 31*  CREATININE 5.50* 7.56*   No results for input(s): TROPONINI in the last 72 hours.  Invalid input(s): CK, MB Hepatic Function Panel Recent Labs    10/18/19 0801  ALBUMIN 3.1*   No results for input(s): CHOL in the last 72 hours. No results for input(s): PROTIME in the last 72 hours.  Imaging: Imaging results have been reviewed and No results found.  Cardiac Studies:  Assessment/Plan:  Status post small,Acute non-Q-wave myocardial infarction.status post left cardiac catheterization Multivesselcalcific CAD , history of PCI to LAD and RCA in the past Ischemic cardiomyopathy. Status post new onset A. Fib with RVR. Hypertension Hyperlipidemia Hypothyroidism. Morbid obesity. End-stage renal disease on hemodialysis. History of nephrotic syndrome. Degenerative joint disease Plan Continue  present management. Awaiting CVTS  recommendations  LOS: 6 days    Charolette Forward 10/19/2019, 11:35 AM

## 2019-10-20 LAB — CBC
HCT: 32.8 % — ABNORMAL LOW (ref 36.0–46.0)
Hemoglobin: 10.4 g/dL — ABNORMAL LOW (ref 12.0–15.0)
MCH: 30.2 pg (ref 26.0–34.0)
MCHC: 31.7 g/dL (ref 30.0–36.0)
MCV: 95.3 fL (ref 80.0–100.0)
Platelets: 179 10*3/uL (ref 150–400)
RBC: 3.44 MIL/uL — ABNORMAL LOW (ref 3.87–5.11)
RDW: 14.3 % (ref 11.5–15.5)
WBC: 6.8 10*3/uL (ref 4.0–10.5)
nRBC: 0 % (ref 0.0–0.2)

## 2019-10-20 LAB — RENAL FUNCTION PANEL
Albumin: 3.4 g/dL — ABNORMAL LOW (ref 3.5–5.0)
Anion gap: 12 (ref 5–15)
BUN: 26 mg/dL — ABNORMAL HIGH (ref 6–20)
CO2: 25 mmol/L (ref 22–32)
Calcium: 9.2 mg/dL (ref 8.9–10.3)
Chloride: 97 mmol/L — ABNORMAL LOW (ref 98–111)
Creatinine, Ser: 7.93 mg/dL — ABNORMAL HIGH (ref 0.44–1.00)
GFR calc Af Amer: 6 mL/min — ABNORMAL LOW (ref >60)
GFR calc non Af Amer: 5 mL/min — ABNORMAL LOW (ref >60)
Glucose, Bld: 94 mg/dL (ref 70–99)
Phosphorus: 3.9 mg/dL (ref 2.5–4.6)
Potassium: 4.2 mmol/L (ref 3.5–5.1)
Sodium: 134 mmol/L — ABNORMAL LOW (ref 135–145)

## 2019-10-20 LAB — MAGNESIUM: Magnesium: 2.1 mg/dL (ref 1.7–2.4)

## 2019-10-20 LAB — HEPARIN LEVEL (UNFRACTIONATED): Heparin Unfractionated: 0.31 IU/mL (ref 0.30–0.70)

## 2019-10-20 MED ORDER — HEPARIN SODIUM (PORCINE) 1000 UNIT/ML DIALYSIS
5000.0000 [IU] | INTRAMUSCULAR | Status: AC | PRN
  Administered 2019-10-20: 4300 [IU] via INTRAVENOUS_CENTRAL
  Filled 2019-10-20 (×2): qty 5

## 2019-10-20 MED ORDER — CALCITRIOL 0.5 MCG PO CAPS
ORAL_CAPSULE | ORAL | Status: AC
  Administered 2019-10-20: 0.75 ug via ORAL
  Filled 2019-10-20: qty 3

## 2019-10-20 MED ORDER — HEPARIN SODIUM (PORCINE) 1000 UNIT/ML IJ SOLN
INTRAMUSCULAR | Status: AC
  Filled 2019-10-20: qty 5

## 2019-10-20 MED ORDER — TICAGRELOR 90 MG PO TABS
90.0000 mg | ORAL_TABLET | Freq: Two times a day (BID) | ORAL | Status: DC
  Administered 2019-10-21 – 2019-10-22 (×3): 90 mg via ORAL
  Filled 2019-10-20 (×3): qty 1

## 2019-10-20 MED ORDER — TICAGRELOR 90 MG PO TABS
180.0000 mg | ORAL_TABLET | Freq: Once | ORAL | Status: AC
  Administered 2019-10-20: 18:00:00 180 mg via ORAL
  Filled 2019-10-20: qty 2

## 2019-10-20 MED ORDER — CARVEDILOL 3.125 MG PO TABS
3.1250 mg | ORAL_TABLET | Freq: Two times a day (BID) | ORAL | Status: DC
  Administered 2019-10-21 – 2019-10-22 (×3): 3.125 mg via ORAL
  Filled 2019-10-20 (×4): qty 1

## 2019-10-20 MED ORDER — HYDRALAZINE HCL 20 MG/ML IJ SOLN
10.0000 mg | Freq: Four times a day (QID) | INTRAMUSCULAR | Status: DC | PRN
  Administered 2019-10-20: 05:00:00 10 mg via INTRAVENOUS
  Filled 2019-10-20: qty 1

## 2019-10-20 NOTE — H&P (View-Only) (Signed)
Subjective:  Patient denies any chest pain or shortness of breath.  Patient noted to have small veins in lower extremity and not enough for complete revascularization and felt not to be a good candidate for open heart surgery. Discussed with interventional cardiologist, Dr. Fletcher Anon who plans to do PCI tomorrow and advised  to keep patient nothing by mouth and restart Brilinta .  Dr. Fletcher Anon will discuss with patient in a.m about risks and benefits of the procedure.discussed briefly with the patient about not being a surgical candidate, and various options of treatment and agrees for PCI. Objective:  Vital Signs in the last 24 hours: Temp:  [97.5 F (36.4 C)-98.1 F (36.7 C)] 97.7 F (36.5 C) (11/24 1150) Pulse Rate:  [62-77] 77 (11/24 1150) Resp:  [11-24] 24 (11/24 1150) BP: (129-188)/(83-118) 129/98 (11/24 1150) SpO2:  [99 %-100 %] 99 % (11/24 1150)  Intake/Output from previous day: 11/23 0701 - 11/24 0700 In: 762.3 [P.O.:360; I.V.:402.3] Out: -  Intake/Output from this shift: Total I/O In: 240 [P.O.:240] Out: -   Physical Exam: Neck: no adenopathy, no carotid bruit, no JVD and supple, symmetrical, trachea midline Lungs: clear to auscultation bilaterally Heart: regular rate and rhythm, S1, S2 normal and soft systolic murmur noted Abdomen: soft, non-tender; bowel sounds normal; no masses,  no organomegaly Extremities: extremities normal, atraumatic, no cyanosis or edema  Lab Results: Recent Labs    10/19/19 0222 10/20/19 0239  WBC 5.8 6.8  HGB 9.9* 10.4*  PLT 176 179   Recent Labs    10/18/19 0801  NA 135  K 5.9*  CL 98  CO2 27  GLUCOSE 86  BUN 31*  CREATININE 7.56*   No results for input(s): TROPONINI in the last 72 hours.  Invalid input(s): CK, MB Hepatic Function Panel Recent Labs    10/18/19 0801  ALBUMIN 3.1*   No results for input(s): CHOL in the last 72 hours. No results for input(s): PROTIME in the last 72 hours.  Imaging: Imaging results have been  reviewed and No results found.  Cardiac Studies:  Assessment/Plan:  Status post small,Acute non-Q-wave myocardial infarction.status post left cardiac catheterization Multivesselcalcific CAD , history of PCI to LAD and RCA in the past Ischemic cardiomyopathy. Status post new onset A. Fib with RVR. Hypertension Hyperlipidemia Hypothyroidism. Morbid obesity. End-stage renal disease on hemodialysis. History of nephrotic syndrome. Degenerative joint disease Plan Restart Brilinta as per orders. Start carvedilol 3.125 mg twice daily. Keep nothing by mouth after midnight  LOS: 7 days    Charolette Forward 10/20/2019, 12:21 PM

## 2019-10-20 NOTE — Progress Notes (Signed)
     [address]       [place] [address]             [phone]       On review of her lower extremity vein mapping, the vein in both lower extremities appear small.  Given that she will require at least 5 bypass, and she has had renal access issues in her lower extremities, I am not confident that she will have enough vein conduit for complete revascularization from a surgical standpoint.  Would consider evaluation for high risk PCI.  Stefanie Hodgens Bary Leriche

## 2019-10-20 NOTE — Progress Notes (Signed)
Subjective:  Patient denies any chest pain or shortness of breath.  Patient noted to have small veins in lower extremity and not enough for complete revascularization and felt not to be a good candidate for open heart surgery. Discussed with interventional cardiologist, Dr. Fletcher Anon who plans to do PCI tomorrow and advised  to keep patient nothing by mouth and restart Brilinta .  Dr. Fletcher Anon will discuss with patient in a.m about risks and benefits of the procedure.discussed briefly with the patient about not being a surgical candidate, and various options of treatment and agrees for PCI. Objective:  Vital Signs in the last 24 hours: Temp:  [97.5 F (36.4 C)-98.1 F (36.7 C)] 97.7 F (36.5 C) (11/24 1150) Pulse Rate:  [62-77] 77 (11/24 1150) Resp:  [11-24] 24 (11/24 1150) BP: (129-188)/(83-118) 129/98 (11/24 1150) SpO2:  [99 %-100 %] 99 % (11/24 1150)  Intake/Output from previous day: 11/23 0701 - 11/24 0700 In: 762.3 [P.O.:360; I.V.:402.3] Out: -  Intake/Output from this shift: Total I/O In: 240 [P.O.:240] Out: -   Physical Exam: Neck: no adenopathy, no carotid bruit, no JVD and supple, symmetrical, trachea midline Lungs: clear to auscultation bilaterally Heart: regular rate and rhythm, S1, S2 normal and soft systolic murmur noted Abdomen: soft, non-tender; bowel sounds normal; no masses,  no organomegaly Extremities: extremities normal, atraumatic, no cyanosis or edema  Lab Results: Recent Labs    10/19/19 0222 10/20/19 0239  WBC 5.8 6.8  HGB 9.9* 10.4*  PLT 176 179   Recent Labs    10/18/19 0801  NA 135  K 5.9*  CL 98  CO2 27  GLUCOSE 86  BUN 31*  CREATININE 7.56*   No results for input(s): TROPONINI in the last 72 hours.  Invalid input(s): CK, MB Hepatic Function Panel Recent Labs    10/18/19 0801  ALBUMIN 3.1*   No results for input(s): CHOL in the last 72 hours. No results for input(s): PROTIME in the last 72 hours.  Imaging: Imaging results have been  reviewed and No results found.  Cardiac Studies:  Assessment/Plan:  Status post small,Acute non-Q-wave myocardial infarction.status post left cardiac catheterization Multivesselcalcific CAD , history of PCI to LAD and RCA in the past Ischemic cardiomyopathy. Status post new onset A. Fib with RVR. Hypertension Hyperlipidemia Hypothyroidism. Morbid obesity. End-stage renal disease on hemodialysis. History of nephrotic syndrome. Degenerative joint disease Plan Restart Brilinta as per orders. Start carvedilol 3.125 mg twice daily. Keep nothing by mouth after midnight  LOS: 7 days    Charolette Forward 10/20/2019, 12:21 PM

## 2019-10-20 NOTE — Progress Notes (Signed)
[facility] notified patient BP 188/118 HR 59 with headache 1 Vicodin given. Arthor Captain LPN

## 2019-10-20 NOTE — Progress Notes (Signed)
Subjective:  Seen in room, no distress, no c/o   Objective Vital signs in last 24 hours: Vitals:   10/20/19 0337 10/20/19 0419 10/20/19 0743 10/20/19 1150  BP: (!) 160/94 (!) 188/118 (!) 141/83 (!) 129/98  Pulse: 65  65 77  Resp: 13  11 (!) 24  Temp: (!) 97.5 F (36.4 C)  97.8 F (36.6 C) 97.7 F (36.5 C)  TempSrc: Oral  Oral Oral  SpO2: 100%  100% 99%  Weight:      Height:       Weight change:   Intake/Output Summary (Last 24 hours) at 10/20/2019 1152 Last data filed at 10/20/2019 1030 Gross per 24 hour  Intake 882.26 ml  Output -  Net 882.26 ml   Physical Exam: General: NAD-  Heart: irreg- slow  Lungs: mostly clear Abdomen: soft,non tender Extremities: chronic changes, no edema Dialysis Access: [facility]    Dialysis: [facility] MWF   4h  102kg  2/2.5 bath  Hep 10,000  [facility]  (P cath Dependant )     Calcitriol 0.28mcg  Po q hd Mirciera 50 mcg iv q 4 wks  ( last on 09/23/19)  Op hgb 10.5 (10/07/19 )   Assessment/Plan 1. Acute NonQ wave MI ( ho Multivessel  CAD sp LAD/ RCA  PCI in past.   Has multivessel disease. CTS evaluating.  2. New A Fib with RVR -  per cards, amio and lopressor 3. ESRD - HD MWF via TDC. HD today on holiday sched 4. Hypertension/volume  - bp variable, 1-2kg up, stable resp 5. Anemia of ESRD= HGB 10.2-  No ESA for a while, got 60ug darbe on 11/22 here and repeat wkly 6. Metabolic bone disease -  Po vit d on hd and binder( renvela)  with meals Ca 8.4-  7. Hypothyroidism - HO  Chronic noncompliance with Levothyroxine in past    Kelly Splinter, MD 10/20/2019, 11:52 AM       Labs: Basic Metabolic Panel: Recent Labs  Lab 10/16/19 0823 10/17/19 0152 10/18/19 0801  NA 134* 137 135  K 4.5 4.6 5.9*  CL 94* 96* 98  CO2 28 28 27   GLUCOSE 114* 99 86  BUN 26* 16 31*  CREATININE 6.41* 5.50* 7.56*  CALCIUM 8.0* 8.4* 8.5*  PHOS 5.3* 5.3* 5.5*   Liver Function Tests: Recent Labs  Lab 10/16/19 0823 10/17/19 0152 10/18/19 0801  ALBUMIN 3.2*  3.3* 3.1*   No results for input(s): LIPASE, AMYLASE in the last 168 hours. No results for input(s): AMMONIA in the last 168 hours. CBC: Recent Labs  Lab 10/16/19 0524 10/17/19 0152 10/18/19 0247 10/19/19 0222 10/20/19 0239  WBC 7.2 6.7 6.7 5.8 6.8  HGB 11.0* 10.2* 10.2* 9.9* 10.4*  HCT 34.3* 32.5* 32.2* 30.8* 32.8*  MCV 96.3 97.3 98.2 95.4 95.3  PLT 200 188 193 176 179   Cardiac Enzymes: No results for input(s): CKTOTAL, CKMB, CKMBINDEX, TROPONINI in the last 168 hours. CBG: No results for input(s): GLUCAP in the last 168 hours.  Iron Studies: No results for input(s): IRON, TIBC, TRANSFERRIN, FERRITIN in the last 72 hours. Studies/Results: No results found. Medications: Infusions: . sodium chloride    . heparin 1,800 Units/hr (10/20/19 1142)  . sodium chloride      Scheduled Medications: . amiodarone  200 mg Oral Daily  . aspirin EC  81 mg Oral Daily  . atorvastatin  80 mg Oral q morning - 10a  . calcitRIOL  0.75 mcg Oral Q M,W,F-HD  .  Chlorhexidine Gluconate Cloth  6 each Topical Daily  . Chlorhexidine Gluconate Cloth  6 each Topical Q0600  . Chlorhexidine Gluconate Cloth  6 each Topical Q0600  . cyclobenzaprine  5 mg Oral Once  . levothyroxine  300 mcg Oral QHS  . nitroGLYCERIN  0.2 mg Transdermal Daily  . sevelamer carbonate  3,200 mg Oral TID WC  . sodium chloride flush  3 mL Intravenous Once  . sodium chloride flush  3 mL Intravenous Q12H    have reviewed scheduled and prn medications.

## 2019-10-20 NOTE — Plan of Care (Signed)

## 2019-10-20 NOTE — Progress Notes (Signed)
ANTICOAGULATION CONSULT NOTE - Follow Up Consult  Pharmacy Consult for Heparin Indication: atrial fibrillation and CAD, awaiting possible CABG  Allergies  Allergen Reactions  . Activase [Alteplase] Shortness Of Breath  . Bee Pollen Anaphylaxis  . Warfarin Sodium Nausea And Vomiting and Rash    Patient Measurements: Height: 5\' 3"  (160 cm) Weight: 228 lb 2.8 oz (103.5 kg) IBW/kg (Calculated) : 52.4 Heparin Dosing Weight: 77 kg  Vital Signs: Temp: 97.8 F (36.6 C) (11/24 0743) Temp Source: Oral (11/24 0743) BP: 141/83 (11/24 0743) Pulse Rate: 65 (11/24 0743)  Labs: Recent Labs    10/18/19 0247 10/18/19 0801 10/19/19 0222 10/19/19 1205 10/19/19 2210 10/20/19 0239  HGB 10.2*  --  9.9*  --   --  10.4*  HCT 32.2*  --  30.8*  --   --  32.8*  PLT 193  --  176  --   --  179  HEPARINUNFRC 0.31  --  0.19* 0.22* 0.31 0.31  CREATININE  --  7.56*  --   --   --   --     Assessment:  48 y.o. female with Afib, s/p cath 11/18. Multivessel CAD with possible plans for CABG. Continues on IV heparin.   Heparin level remains therapeutic at 0.31, on 1750 units/hr. Hgb 10.4, plt 179. No s/sx of bleeding. No infusion issues,   Goal of Therapy:  Heparin level 0.3-0.7 units/ml Monitor platelets by anticoagulation protocol: Yes   Plan:  Increase heparin gtt to 1800 units/hr to keep in goal range  Monitor CBC, heparin level, and for s/sx of bleeding daily F/u cardiac plans - poss high risk PCI  Antonietta Jewel, PharmD, BCCCP Clinical Pharmacist  Phone: [phone]  Please check AMION for all [place] phone numbers After 10:00 PM, call [place] [phone] 10/20/2019 8:19 AM

## 2019-10-20 NOTE — Progress Notes (Signed)
PROGRESS NOTE  Lori [name]  DOB: Sep 23, 1971  PCP: Patient, No Pcp Per [id number]  DOA: 10/12/2019  LOS: 7 days   Chief Complaint  Patient presents with   Chest Pain   Brief narrative: Patient is a 48 y.o. female, with h/o CAD s/p stent to LAD, hypertension, ESRD on HD MWF, hypothyroidism, hyperparathyroidism, GERD.  Patient presented to the hospital on 11/16 with chest pain, which started after she came back from  hemodialysis. Chest pain did not improve with nitroglycerin and hence came to the ED. Patient's last heart cath was done by Dr. Terrence Dupont in 2019 with placement of drug-eluting stent in LAD. In the ED initial troponin was 44, repeat troponin was 331. Dr. Roderic Palau spoke to Dr. Terrence Dupont, who recommended that patient be started on heparin, full dose anticoagulation. She was also found to be in A. fib with RVR. Patient was admitted to hospitalist medicine service.  Cardiology consultation was called. 11/18, patient underwent cardiac catheterization.  Per cardiac cath note, patient has severe heavily calcified three-vessel coronary artery disease, severely tortuous. PCI options are limited because of multiple lesions, calcification and tortuosity.  Cardiovascular consultation called to see if she is a candidate for CABG.  Subjective: Patient was seen and examined this morning.  Not in distress.  Noted recommendation from CT surgery.  Patient not a candidate for multivessel bypass grafting.  Patient is planned for PCI tomorrow.    Assessment/Plan: Acute coronary syndrome Multivessel coronary disease s/p PCI to LAD and RCA in the past Ischemic cardiomyopathy - LVEF 50% -patient presented with chest pain with elevated troponin.   -11/18, cardiac cath.  Multivessel disease. Cardiovascular surgery consultation was obtained. Being planned for CABG. Underwent venous mapping of the lower extremities yesterday.  -Cardiac surgery recommendation noted.  Patient not a candidate for  multivessel bypass surgery.  Noted a plan to obtain a PCI tomorrow.  Brilinta resumed.  Atrial fibrillation -On amiodarone and Coreg for rate control.  Continue heparin drip.  ESRD on HD, MWF as well as holiday scheduled. -Nephrology consultation appreciated.  Hypothyroidism - continue Synthroid  Hypertension - continue Cozaar, Coreg.  Morbid obesity - Body mass index is 41.51 kg/m. Patient has been advised to make an attempt to improve diet and exercise patterns to aid in weight loss.  Mobility: Encourage ambulation DVT prophylaxis:  Heparin drip Code Status:   Code Status: Full Code  Family Communication:  None Expected Discharge:  PCI tomorrow.  Consultants:  Cardiology, nephrology, CT surgery  Procedures:  Cardiac cath 11/18  Antimicrobials: Anti-infectives (From admission, onward)   None      Diet Order            Diet NPO time specified  Diet effective midnight        Diet Heart Room service appropriate? Yes with Assist; Fluid consistency: Thin  Diet effective now              Infusions:   sodium chloride     heparin 1,800 Units/hr (10/20/19 1142)   sodium chloride      Scheduled Meds:  amiodarone  200 mg Oral Daily   aspirin EC  81 mg Oral Daily   atorvastatin  80 mg Oral q morning - 10a   calcitRIOL  0.75 mcg Oral Q M,W,F-HD   carvedilol  3.125 mg Oral BID WC   Chlorhexidine Gluconate Cloth  6 each Topical Daily   Chlorhexidine Gluconate Cloth  6 each Topical Q0600   Chlorhexidine Gluconate Cloth  6  each Topical Q0600   cyclobenzaprine  5 mg Oral Once   levothyroxine  300 mcg Oral QHS   nitroGLYCERIN  0.2 mg Transdermal Daily   sevelamer carbonate  3,200 mg Oral TID WC   sodium chloride flush  3 mL Intravenous Once   sodium chloride flush  3 mL Intravenous Q12H   ticagrelor  180 mg Oral Once   [START ON 10/21/2019] ticagrelor  90 mg Oral BID    PRN meds: sodium chloride, acetaminophen, [START ON 10/21/2019] heparin,  hydrALAZINE, HYDROcodone-acetaminophen, Muscle Rub, nitroGLYCERIN, ondansetron **OR** ondansetron (ZOFRAN) IV, sevelamer carbonate, sodium chloride flush   Objective: Vitals:   10/20/19 1430 10/20/19 1500  BP: (!) 149/72 130/73  Pulse: 76 81  Resp:    Temp:    SpO2:      Intake/Output Summary (Last 24 hours) at 10/20/2019 1509 Last data filed at 10/20/2019 1030 Gross per 24 hour  Intake 882.26 ml  Output --  Net 882.26 ml   Filed Weights   10/18/19 0720 10/18/19 1129 10/20/19 1242  Weight: 105 kg 103.5 kg 106.3 kg   Weight change:  Body mass index is 41.51 kg/m.   Physical Exam:  General exam: Appears calm and comfortable.  Sitting up in chair.  Not in distress Skin: No rashes, lesions or ulcers.  Generalized skin dryness. HEENT: Atraumatic, normocephalic, supple neck, no obvious bleeding Lungs: Clear to auscultation bilaterally CVS: Regular rate and rhythm, no murmur GI/Abd soft, nontender, nondistended, bowel sound present CNS: Alert, awake, oriented x3 Psychiatry: Mood appropriate Extremities: No pedal edema, no calf tenderness  Data Review: I have personally reviewed the laboratory data and studies available.  Recent Labs  Lab 10/16/19 0524 10/17/19 0152 10/18/19 0247 10/19/19 0222 10/20/19 0239  WBC 7.2 6.7 6.7 5.8 6.8  HGB 11.0* 10.2* 10.2* 9.9* 10.4*  HCT 34.3* 32.5* 32.2* 30.8* 32.8*  MCV 96.3 97.3 98.2 95.4 95.3  PLT 200 188 193 176 179   Recent Labs  Lab 10/15/19 0230 10/16/19 0823 10/17/19 0152 10/18/19 0801 10/20/19 0239 10/20/19 1332  NA 135 134* 137 135  --  134*  K 4.7 4.5 4.6 5.9*  --  4.2  CL 97* 94* 96* 98  --  97*  CO2 24 28 28 27   --  25  GLUCOSE 79 114* 99 86  --  94  BUN 17 26* 16 31*  --  26*  CREATININE 5.56* 6.41* 5.50* 7.56*  --  7.93*  CALCIUM 8.1* 8.0* 8.4* 8.5*  --  9.2  MG 1.8  --   --   --  2.1  --   PHOS  --  5.3* 5.3* 5.5*  --  3.9    Terrilee Croak, MD  [facility] 10/20/2019

## 2019-10-21 ENCOUNTER — Inpatient Hospital Stay (HOSPITAL_COMMUNITY)
Admission: EM | Disposition: A | Discharge status: Home / Self Care | Payer: Self-pay | Source: Home / Self Care | Attending: Internal Medicine

## 2019-10-21 HISTORY — PX: CORONARY STENT INTERVENTION: CATH118234

## 2019-10-21 LAB — CBC
HCT: 30.6 % — ABNORMAL LOW (ref 36.0–46.0)
Hemoglobin: 10.1 g/dL — ABNORMAL LOW (ref 12.0–15.0)
MCH: 30.9 pg (ref 26.0–34.0)
MCHC: 33 g/dL (ref 30.0–36.0)
MCV: 93.6 fL (ref 80.0–100.0)
Platelets: 189 10*3/uL (ref 150–400)
RBC: 3.27 MIL/uL — ABNORMAL LOW (ref 3.87–5.11)
RDW: 14.4 % (ref 11.5–15.5)
WBC: 6.6 10*3/uL (ref 4.0–10.5)
nRBC: 0 % (ref 0.0–0.2)

## 2019-10-21 LAB — BASIC METABOLIC PANEL
Anion gap: 13 (ref 5–15)
BUN: 14 mg/dL (ref 6–20)
CO2: 23 mmol/L (ref 22–32)
Calcium: 9.5 mg/dL (ref 8.9–10.3)
Chloride: 100 mmol/L (ref 98–111)
Creatinine, Ser: 5.47 mg/dL — ABNORMAL HIGH (ref 0.44–1.00)
GFR calc Af Amer: 10 mL/min — ABNORMAL LOW (ref >60)
GFR calc non Af Amer: 8 mL/min — ABNORMAL LOW (ref >60)
Glucose, Bld: 85 mg/dL (ref 70–99)
Potassium: 4.8 mmol/L (ref 3.5–5.1)
Sodium: 136 mmol/L (ref 135–145)

## 2019-10-21 LAB — HEPARIN LEVEL (UNFRACTIONATED): Heparin Unfractionated: 0.32 IU/mL (ref 0.30–0.70)

## 2019-10-21 LAB — POCT ACTIVATED CLOTTING TIME: Activated Clotting Time: 483 seconds

## 2019-10-21 SURGERY — CORONARY STENT INTERVENTION
Anesthesia: LOCAL

## 2019-10-21 MED ORDER — HEPARIN (PORCINE) IN NACL 1000-0.9 UT/500ML-% IV SOLN
INTRAVENOUS | Status: AC
  Filled 2019-10-21: qty 1000

## 2019-10-21 MED ORDER — MIDAZOLAM HCL 2 MG/2ML IJ SOLN
INTRAMUSCULAR | Status: AC
  Filled 2019-10-21: qty 2

## 2019-10-21 MED ORDER — SODIUM CHLORIDE 0.9% FLUSH: 3.0000 mL | Freq: Two times a day (BID) | INTRAVENOUS | Status: DC

## 2019-10-21 MED ORDER — LABETALOL HCL 5 MG/ML IV SOLN: 10.0000 mg | INTRAVENOUS | Status: AC | PRN

## 2019-10-21 MED ORDER — SODIUM CHLORIDE 0.9 % IV SOLN: 250.0000 mL | INTRAVENOUS | Status: DC | PRN

## 2019-10-21 MED ORDER — HEPARIN SODIUM (PORCINE) 1000 UNIT/ML IJ SOLN
INTRAMUSCULAR | Status: AC
  Filled 2019-10-21: qty 1

## 2019-10-21 MED ORDER — FENTANYL CITRATE (PF) 100 MCG/2ML IJ SOLN
INTRAMUSCULAR | Status: DC | PRN
  Administered 2019-10-21 (×2): 50 ug via INTRAVENOUS

## 2019-10-21 MED ORDER — SODIUM CHLORIDE 0.9% FLUSH
3.0000 mL | Freq: Two times a day (BID) | INTRAVENOUS | Status: DC
  Administered 2019-10-21: 12:00:00 3 mL via INTRAVENOUS

## 2019-10-21 MED ORDER — IOHEXOL 350 MG/ML SOLN
INTRAVENOUS | Status: DC | PRN
  Administered 2019-10-21: 10:00:00 130 mL

## 2019-10-21 MED ORDER — FENTANYL CITRATE (PF) 100 MCG/2ML IJ SOLN
INTRAMUSCULAR | Status: AC
  Filled 2019-10-21: qty 2

## 2019-10-21 MED ORDER — MIDAZOLAM HCL 2 MG/2ML IJ SOLN
INTRAMUSCULAR | Status: DC | PRN
  Administered 2019-10-21 (×2): 1 mg via INTRAVENOUS

## 2019-10-21 MED ORDER — SODIUM CHLORIDE 0.9% FLUSH: 3.0000 mL | INTRAVENOUS | Status: DC | PRN

## 2019-10-21 MED ORDER — NITROGLYCERIN 1 MG/10 ML FOR IR/CATH LAB
INTRA_ARTERIAL | Status: AC
  Filled 2019-10-21: qty 10

## 2019-10-21 MED ORDER — LIDOCAINE HCL (PF) 1 % IJ SOLN
INTRAMUSCULAR | Status: AC
  Filled 2019-10-21: qty 30

## 2019-10-21 MED ORDER — LIDOCAINE HCL (PF) 1 % IJ SOLN
INTRAMUSCULAR | Status: DC | PRN
  Administered 2019-10-21: 30 mL via INTRADERMAL

## 2019-10-21 MED ORDER — HEPARIN SODIUM (PORCINE) 1000 UNIT/ML IJ SOLN
INTRAMUSCULAR | Status: DC | PRN
  Administered 2019-10-21: 10000 [IU] via INTRAVENOUS

## 2019-10-21 MED ORDER — SODIUM CHLORIDE 0.9 % IV SOLN: INTRAVENOUS | Status: DC

## 2019-10-21 MED ORDER — HEPARIN (PORCINE) IN NACL 1000-0.9 UT/500ML-% IV SOLN
INTRAVENOUS | Status: DC | PRN
  Administered 2019-10-21 (×2): 500 mL

## 2019-10-21 SURGICAL SUPPLY — 18 items
BALLN SAPPHIRE 2.0X12 (BALLOONS) ×2
BALLN SAPPHIRE ~~LOC~~ 2.5X15 (BALLOONS) ×2 IMPLANT
BALLOON SAPPHIRE 2.0X12 (BALLOONS) ×1 IMPLANT
CATH VISTA GUIDE 6FR XBLAD3.5 (CATHETERS) ×2 IMPLANT
CLOSURE MYNX CONTROL 6F/7F (Vascular Products) ×2 IMPLANT
KIT ENCORE 26 ADVANTAGE (KITS) ×2 IMPLANT
KIT HEART LEFT (KITS) ×2 IMPLANT
KIT MICROPUNCTURE NIT STIFF (SHEATH) ×2 IMPLANT
PACK CARDIAC CATHETERIZATION (CUSTOM PROCEDURE TRAY) ×2 IMPLANT
SHEATH PINNACLE 6F 10CM (SHEATH) ×2 IMPLANT
SHEATH PROBE COVER 6X72 (BAG) ×2 IMPLANT
STENT RESOLUTE ONYX 2.25X26 (Permanent Stent) ×2 IMPLANT
STENT RESOLUTE ONYX 2.5X15 (Permanent Stent) ×2 IMPLANT
TRANSDUCER W/STOPCOCK (MISCELLANEOUS) ×2 IMPLANT
TUBING CIL FLEX 10 FLL-RA (TUBING) ×2 IMPLANT
WIRE EMERALD 3MM-J .035X150CM (WIRE) ×4 IMPLANT
WIRE MICROINTRODUCER 60CM (WIRE) ×2 IMPLANT
WIRE RUNTHROUGH .014X180CM (WIRE) ×2 IMPLANT

## 2019-10-21 NOTE — Plan of Care (Signed)
  Problem: Education: Goal: Knowledge of General Education information will improve Description: Including pain rating scale, medication(s)/side effects and non-pharmacologic comfort measures Outcome: Progressing   Problem: Health Behavior/Discharge Planning: Goal: Ability to manage health-related needs will improve Outcome: Progressing   Problem: Clinical Measurements: Goal: Ability to maintain clinical measurements within normal limits will improve Outcome: Progressing Goal: Will remain free from infection Outcome: Progressing Goal: Diagnostic test results will improve Outcome: Progressing Goal: Respiratory complications will improve Outcome: Progressing Goal: Cardiovascular complication will be avoided Outcome: Progressing   Problem: Nutrition: Goal: Adequate nutrition will be maintained Outcome: Progressing   Problem: Coping: Goal: Level of anxiety will decrease Outcome: Progressing   Problem: Elimination: Goal: Will not experience complications related to bowel motility Outcome: Progressing Goal: Will not experience complications related to urinary retention Outcome: Completed/Met   Problem: Pain Managment: Goal: General experience of comfort will improve Outcome: Progressing   Problem: Safety: Goal: Ability to remain free from injury will improve Outcome: Progressing   Problem: Skin Integrity: Goal: Risk for impaired skin integrity will decrease Outcome: Progressing

## 2019-10-21 NOTE — TOC Benefit Eligibility Note (Signed)
Transition of Care [facility]) Benefit Eligibility Note    Patient Details  Name: Lori [name] MRN: [id number] Date of Birth: Aug 25, 1971   Medication/Dose: [name] 90 MG BID        Prescription Coverage Preferred Pharmacy: [facility]              Additional Notes: MEDICAID OF [place] , EFF-DATE: 10-26-2018 , CO-PAY- $3.90 FOR EACH Beckey Downing Phone Number: 10/21/2019, 4:51 PM

## 2019-10-21 NOTE — Progress Notes (Signed)
Subjective:  Appreciate Dr. Tyrell Antonio  help  Patient tolerated PCI to LAD and obtuse marginal 2 with excellent angiographic results.  Patient denies any chest pain or shortness of breath.  States feels better after PCI.  Objective:  Vital Signs in the last 24 hours: Temp:  [97.3 F (36.3 C)-98.1 F (36.7 C)] 97.7 F (36.5 C) (11/25 1028) Pulse Rate:  [0-151] 65 (11/25 1028) Resp:  [0-26] 12 (11/25 1028) BP: (97-169)/(56-107) 120/87 (11/25 1028) SpO2:  [0 %-100 %] 100 % (11/25 1028) Weight:  [101.5 kg-102.8 kg] 102.8 kg (11/25 0800)  Intake/Output from previous day: 11/24 0701 - 11/25 0700 In: 651.7 [P.O.:240; I.V.:411.7] Out: 3000  Intake/Output from this shift: No intake/output data recorded.  Physical Exam: Neck: no adenopathy, no carotid bruit, no JVD, supple, symmetrical, trachea midline and thyroid not enlarged, symmetric, no tenderness/mass/nodules Lungs: clear to auscultation bilaterally Heart: regular rate and rhythm, S1, S2 normal and soft systolic murmur noted Abdomen: soft, non-tender; bowel sounds normal; no masses,  no organomegaly Extremities: extremities normal, atraumatic, no cyanosis or edema and right groin stable  Lab Results: Recent Labs    10/20/19 0239 10/21/19 0233  WBC 6.8 6.6  HGB 10.4* 10.1*  PLT 179 189   Recent Labs    10/20/19 1332 10/21/19 0617  NA 134* 136  K 4.2 4.8  CL 97* 100  CO2 25 23  GLUCOSE 94 85  BUN 26* 14  CREATININE 7.93* 5.47*   No results for input(s): TROPONINI in the last 72 hours.  Invalid input(s): CK, MB Hepatic Function Panel Recent Labs    10/20/19 1332  ALBUMIN 3.4*   No results for input(s): CHOL in the last 72 hours. No results for input(s): PROTIME in the last 72 hours.  Imaging: Imaging results have been reviewed and No results found.  Cardiac Studies:  Assessment/Plan:  Status post small,Acute non-Q-wave myocardial infarction.status post left cardiac catheterization/status post PCI to LAD and  OM 2 Multivesselcalcific CAD , history of PCI to LAD and RCA in the past Ischemic cardiomyopathy. Status post new onset A. Fib with RVR. Hypertension Hyperlipidemia Hypothyroidism. Morbid obesity. End-stage renal disease on hemodialysis. History of nephrotic syndrome. Degenerative joint disease Plan Continue present management. Possible discharge tomorrow after hemodialysis. Agree with holding off to anticoagulation for now in view of high risk of bleeding, but continue with dual antiplatelet medications  LOS: 8 days    Charolette Forward 10/21/2019, 1:45 PM

## 2019-10-21 NOTE — Progress Notes (Signed)
PROGRESS NOTE  Lori [name]  DOB: [birth year]  PCP: Patient, No Pcp Per [id number]  DOA: 10/12/2019  LOS: 8 days   Chief Complaint  Patient presents with  . Chest Pain   Brief narrative: Patient is a 48 y.o. female, with h/o CAD s/p stent to LAD, hypertension, ESRD on HD MWF, hypothyroidism, hyperparathyroidism, GERD.   Patient presented to the hospital on 11/16 with chest pain, which started after she came back from  hemodialysis. Chest pain did not improve with nitroglycerin and hence came to the ED. Patient's last heart cath was done by Dr. Terrence Dupont in 2019 with placement of drug-eluting stent in LAD. In the ED initial troponin was 44, repeat troponin was 331. Dr. Roderic Palau spoke to Dr. Terrence Dupont, who recommended that patient be started on heparin, full dose anticoagulation. She was also found to be in A. fib with RVR. Patient was admitted to hospitalist medicine service.  Cardiology consultation was called. 11/18, patient underwent cardiac catheterization.  Per cardiac cath note, patient has severe heavily calcified three-vessel coronary artery disease, severely tortuous. PCI options are limited because of multiple lesions, calcification and tortuosity.  Cardiovascular consultation called to see if she is a candidate for CABG.  Subjective: Patient was seen and examined this afternoon.  Patient underwent PCI to the LAD and obtuse marginal today.  No chest pain, no shortness of breath.  Expecting to get discharged tomorrow.  Assessment/Plan: Acute coronary syndrome Multivessel coronary disease s/p PCI to LAD and RCA in the past Ischemic cardiomyopathy - LVEF 50% -patient presented with chest pain with elevated troponin.   -11/18, cardiac cath.  Multivessel disease. Cardiovascular surgery consultation was obtained.  Patient was deemed not a candidate for CABG.  Patient underwent PCI with stenting of LAD and obtuse marginal today.  -Continue DAPT.  Atrial fibrillation -On amiodarone and  Coreg for rate control.  Continue heparin drip.  ESRD on HD, MWF as well as holiday scheduled. -Nephrology consultation appreciated.  Hypothyroidism - continue Synthroid  Hypertension - continue Cozaar, Coreg.  Morbid obesity - Body mass index is 40.15 kg/m. Patient has been advised to make an attempt to improve diet and exercise patterns to aid in weight loss.  Mobility: Encourage ambulation DVT prophylaxis:  Heparin drip Code Status:   Code Status: Full Code  Family Communication:  None Expected Discharge:  hopefully home tomorrow.   Consultants:  Cardiology, nephrology, CT surgery  Procedures:  Cardiac cath 11/18  Antimicrobials: Anti-infectives (From admission, onward)   None      Diet Order            Diet Heart Room service appropriate? Yes; Fluid consistency: Thin  Diet effective now              Infusions:  . sodium chloride    . sodium chloride    . sodium chloride      Scheduled Meds: . amiodarone  200 mg Oral Daily  . aspirin EC  81 mg Oral Daily  . atorvastatin  80 mg Oral q morning - 10a  . calcitRIOL  0.75 mcg Oral Q M,W,F-HD  . carvedilol  3.125 mg Oral BID WC  . Chlorhexidine Gluconate Cloth  6 each Topical Daily  . Chlorhexidine Gluconate Cloth  6 each Topical Q0600  . Chlorhexidine Gluconate Cloth  6 each Topical Q0600  . cyclobenzaprine  5 mg Oral Once  . levothyroxine  300 mcg Oral QHS  . nitroGLYCERIN  0.2 mg Transdermal Daily  . sevelamer carbonate  3,200 mg Oral TID WC  . sodium chloride flush  3 mL Intravenous Once  . sodium chloride flush  3 mL Intravenous Q12H  . sodium chloride flush  3 mL Intravenous Q12H  . ticagrelor  90 mg Oral BID    PRN meds: sodium chloride, sodium chloride, acetaminophen, hydrALAZINE, HYDROcodone-acetaminophen, Muscle Rub, nitroGLYCERIN, ondansetron **OR** ondansetron (ZOFRAN) IV, sevelamer carbonate, sodium chloride flush, sodium chloride flush   Objective: Vitals:   10/21/19 1028 10/21/19  1558  BP: 120/87 139/83  Pulse: 65   Resp: 12 17  Temp: 97.7 F (36.5 C) 97.7 F (36.5 C)  SpO2: 100% 100%    Intake/Output Summary (Last 24 hours) at 10/21/2019 1644 Last data filed at 10/21/2019 1500 Gross per 24 hour  Intake 411.65 ml  Output 3000 ml  Net -2588.35 ml   Filed Weights   10/20/19 1242 10/20/19 1651 10/21/19 0800  Weight: 106.3 kg 101.5 kg 102.8 kg   Weight change:  Body mass index is 40.15 kg/m.   Physical Exam:  General exam: Appears calm and comfortable.  Lying down in bed. Not in distress. Skin: No rashes, lesions or ulcers.  Generalized skin dryness. HEENT: Atraumatic, normocephalic, supple neck, no obvious bleeding Lungs: Clear to auscultation bilaterally CVS: Regular rate and rhythm, no murmur GI/Abd soft, nontender, nondistended, bowel sound present CNS: Alert, awake, oriented x3 Psychiatry: Mood appropriate Extremities: No pedal edema, no calf tenderness  Data Review: I have personally reviewed the laboratory data and studies available.  Recent Labs  Lab 10/17/19 0152 10/18/19 0247 10/19/19 0222 10/20/19 0239 10/21/19 0233  WBC 6.7 6.7 5.8 6.8 6.6  HGB 10.2* 10.2* 9.9* 10.4* 10.1*  HCT 32.5* 32.2* 30.8* 32.8* 30.6*  MCV 97.3 98.2 95.4 95.3 93.6  PLT 188 193 176 179 189   Recent Labs  Lab 10/15/19 0230 10/16/19 0823 10/17/19 0152 10/18/19 0801 10/20/19 0239 10/20/19 1332 10/21/19 0617  NA 135 134* 137 135  --  134* 136  K 4.7 4.5 4.6 5.9*  --  4.2 4.8  CL 97* 94* 96* 98  --  97* 100  CO2 24 28 28 27   --  25 23  GLUCOSE 79 114* 99 86  --  94 85  BUN 17 26* 16 31*  --  26* 14  CREATININE 5.56* 6.41* 5.50* 7.56*  --  7.93* 5.47*  CALCIUM 8.1* 8.0* 8.4* 8.5*  --  9.2 9.5  MG 1.8  --   --   --  2.1  --   --   PHOS  --  5.3* 5.3* 5.5*  --  3.9  --     Terrilee Croak, MD  [facility] 10/21/2019

## 2019-10-21 NOTE — Interval H&P Note (Signed)
Cath Lab Visit (complete for each Cath Lab visit)  Clinical Evaluation Leading to the Procedure:   ACS: Yes.    Non-ACS:  n/a   History and Physical Interval Note:  10/21/2019 8:48 AM  Lori [name]  has presented today for surgery, with the diagnosis of cad.  The various methods of treatment have been discussed with the patient and family. After consideration of risks, benefits and other options for treatment, the patient has consented to  Procedure(s): CORONARY STENT INTERVENTION (N/A) as a surgical intervention.  The patient's history has been reviewed, patient examined, no change in status, stable for surgery.  I have reviewed the patient's chart and labs.  Questions were answered to the patient's satisfaction.     Kathlyn [place]

## 2019-10-21 NOTE — Progress Notes (Signed)
ANTICOAGULATION CONSULT NOTE - Follow Up Consult  Pharmacy Consult for Heparin Indication: atrial fibrillation and CAD, awaiting possible CABG  Allergies  Allergen Reactions  . Activase [Alteplase] Shortness Of Breath  . Bee Pollen Anaphylaxis  . Warfarin Sodium Nausea And Vomiting and Rash    Patient Measurements: Height: 5\' 3"  (160 cm) Weight: 223 lb 12.3 oz (101.5 kg) IBW/kg (Calculated) : 52.4 Heparin Dosing Weight: 77 kg  Vital Signs: Temp: 98.1 F (36.7 C) (11/25 0354) Temp Source: Oral (11/25 0354) BP: 154/99 (11/25 0354) Pulse Rate: 60 (11/25 0354)  Labs: Recent Labs    10/18/19 0801  10/19/19 0222  10/19/19 2210 10/20/19 0239 10/20/19 1332 10/21/19 0233 10/21/19 0617  HGB  --    < > 9.9*  --   --  10.4*  --  10.1*  --   HCT  --   --  30.8*  --   --  32.8*  --  30.6*  --   PLT  --   --  176  --   --  179  --  189  --   HEPARINUNFRC  --   --  0.19*   < > 0.31 0.31  --  0.32  --   CREATININE 7.56*  --   --   --   --   --  7.93*  --  5.47*   < > = values in this interval not displayed.    Assessment:  48 y.o. female with Afib, s/p cath 11/18. Multivessel CAD with possible plans for CABG. Continues on IV heparin.   Heparin level remains therapeutic at 0.32, on 1800 units/hr. Hgb 10.1, plt 189. No s/sx of bleeding. No infusion issues.  Goal of Therapy:  Heparin level 0.3-0.7 units/ml Monitor platelets by anticoagulation protocol: Yes   Plan:  Continue heparin gtt at 1800 units/hr Monitor CBC, heparin level, and for s/sx of bleeding daily F/u cardiac plans - poss high risk PCI  Antonietta Jewel, PharmD, BCCCP Clinical Pharmacist  Phone: [phone]  Please check AMION for all [place] phone numbers After 10:00 PM, call [place] [phone] 10/21/2019 7:12 AM

## 2019-10-21 NOTE — Progress Notes (Signed)
CARDIAC REHAB PHASE I   Stent and MI education completed with pt. Pt educated on importance of ASA, Brilinta, statin, and NTG. Pt given MI book and stent card. Deferred diet to her dialysis center. Reviewed restrictions, site care, and exercise guidelines. Pt states ambulation is limited by knee pain. Will refer to CRP II [place]. Pt is interested in participating in Virtual Cardiac and Pulmonary Rehab. Pt advised that Virtual Cardiac and Pulmonary Rehab is provided at no cost to the patient.  Checklist:  1. Pt has smart device  ie smartphone and/or ipad for downloading an app  Yes 2. Reliable internet/wifi service    Yes 3. Understands how to use their smartphone and navigate within an app.  Yes  Pt verbalized understanding and is in agreement.  [id number] Rufina Falco, RN BSN 10/21/2019 11:28 AM

## 2019-10-21 NOTE — Progress Notes (Signed)
Subjective:  No new c/o   Objective Vital signs in last 24 hours: Vitals:   10/21/19 0957 10/21/19 1002 10/21/19 1028 10/21/19 1558  BP: 140/81 (!) 151/91 120/87 139/83  Pulse: 65 75 65   Resp: 16 15 12 17   Temp:   97.7 F (36.5 C) 97.7 F (36.5 C)  TempSrc:   Oral Oral  SpO2: 100% 100% 100% 100%  Weight:      Height:       Weight change:   Intake/Output Summary (Last 24 hours) at 10/21/2019 1645 Last data filed at 10/21/2019 1500 Gross per 24 hour  Intake 411.65 ml  Output 3000 ml  Net -2588.35 ml   Physical Exam: General: NAD-  Heart: irreg- slow  Lungs: mostly clear Abdomen: soft,non tender Extremities: chronic changes, no edema Dialysis Access: [facility]    Dialysis: Joneen Caraway [facility] MWF   4h  102kg  2/2.5 bath  Hep 10,000  [facility]  (P cath Dependant )     Calcitriol 0.70mcg  Po q hd Mirciera 50 mcg iv q 4 wks  ( last on 09/23/19)  Op hgb 10.5 (10/07/19 )   Assessment/Plan 1. Acute NonQ wave MI ( ho Multivessel  CAD sp LAD/ RCA  PCI in past.   Has multivessel disease. Not good CABG candidate. Underwent stenting of LAD and OM today. Cont DAPT per primary/ cards.  2. New A Fib with RVR -  per cards, amio and lopressor 3. ESRD - HD MWF via [facility]DC. HD yest w/ 3L off. Next HD Friday.  4. Hypertension/volume  - BP's ok, up 1kg  5. Anemia of ESRD= HGB 10.2-  No ESA for a while, got 60ug darbe on 11/22 here and repeat wkly 6. Metabolic bone disease -  Po vit d on hd and binder( renvela)  with meals Ca 8.4-  7. Hypothyroidism - HO  Chronic noncompliance with Levothyroxine in past    Kelly Splinter, MD 10/21/2019, 4:45 PM       Labs: Basic Metabolic Panel: Recent Labs  Lab 10/17/19 0152 10/18/19 0801 10/20/19 1332 10/21/19 0617  NA 137 135 134* 136  K 4.6 5.9* 4.2 4.8  CL 96* 98 97* 100  CO2 28 27 25 23   GLUCOSE 99 86 94 85  BUN 16 31* 26* 14  CREATININE 5.50* 7.56* 7.93* 5.47*  CALCIUM 8.4* 8.5* 9.2 9.5  PHOS 5.3* 5.5* 3.9  --    Liver Function Tests: Recent Labs   Lab 10/17/19 0152 10/18/19 0801 10/20/19 1332  ALBUMIN 3.3* 3.1* 3.4*   No results for input(s): LIPASE, AMYLASE in the last 168 hours. No results for input(s): AMMONIA in the last 168 hours. CBC: Recent Labs  Lab 10/17/19 0152 10/18/19 0247 10/19/19 0222 10/20/19 0239 10/21/19 0233  WBC 6.7 6.7 5.8 6.8 6.6  HGB 10.2* 10.2* 9.9* 10.4* 10.1*  HCT 32.5* 32.2* 30.8* 32.8* 30.6*  MCV 97.3 98.2 95.4 95.3 93.6  PLT 188 193 176 179 189   Cardiac Enzymes: No results for input(s): CKTOTAL, CKMB, CKMBINDEX, TROPONINI in the last 168 hours. CBG: No results for input(s): GLUCAP in the last 168 hours.  Iron Studies: No results for input(s): IRON, TIBC, TRANSFERRIN, FERRITIN in the last 72 hours. Studies/Results: No results found. Medications: Infusions: . sodium chloride    . sodium chloride    . sodium chloride      Scheduled Medications: . amiodarone  200 mg Oral Daily  . aspirin EC  81 mg Oral Daily  . atorvastatin  80 mg Oral q morning - 10a  . calcitRIOL  0.75 mcg Oral Q M,W,F-HD  . carvedilol  3.125 mg Oral BID WC  . Chlorhexidine Gluconate Cloth  6 each Topical Daily  . Chlorhexidine Gluconate Cloth  6 each Topical Q0600  . Chlorhexidine Gluconate Cloth  6 each Topical Q0600  . cyclobenzaprine  5 mg Oral Once  . levothyroxine  300 mcg Oral QHS  . nitroGLYCERIN  0.2 mg Transdermal Daily  . sevelamer carbonate  3,200 mg Oral TID WC  . sodium chloride flush  3 mL Intravenous Once  . sodium chloride flush  3 mL Intravenous Q12H  . sodium chloride flush  3 mL Intravenous Q12H  . ticagrelor  90 mg Oral BID    have reviewed scheduled and prn medications.

## 2019-10-22 LAB — BASIC METABOLIC PANEL
Anion gap: 12 (ref 5–15)
BUN: 22 mg/dL — ABNORMAL HIGH (ref 6–20)
CO2: 25 mmol/L (ref 22–32)
Calcium: 9.7 mg/dL (ref 8.9–10.3)
Chloride: 97 mmol/L — ABNORMAL LOW (ref 98–111)
Creatinine, Ser: 7.43 mg/dL — ABNORMAL HIGH (ref 0.44–1.00)
GFR calc Af Amer: 7 mL/min — ABNORMAL LOW (ref >60)
GFR calc non Af Amer: 6 mL/min — ABNORMAL LOW (ref >60)
Glucose, Bld: 87 mg/dL (ref 70–99)
Potassium: 4.4 mmol/L (ref 3.5–5.1)
Sodium: 134 mmol/L — ABNORMAL LOW (ref 135–145)

## 2019-10-22 LAB — CBC
HCT: 32 % — ABNORMAL LOW (ref 36.0–46.0)
Hemoglobin: 10.5 g/dL — ABNORMAL LOW (ref 12.0–15.0)
MCH: 31.3 pg (ref 26.0–34.0)
MCHC: 32.8 g/dL (ref 30.0–36.0)
MCV: 95.2 fL (ref 80.0–100.0)
Platelets: 201 10*3/uL (ref 150–400)
RBC: 3.36 MIL/uL — ABNORMAL LOW (ref 3.87–5.11)
RDW: 14.6 % (ref 11.5–15.5)
WBC: 6.2 10*3/uL (ref 4.0–10.5)
nRBC: 0 % (ref 0.0–0.2)

## 2019-10-22 MED ORDER — CYCLOBENZAPRINE HCL 10 MG PO TABS
5.0000 mg | ORAL_TABLET | Freq: Three times a day (TID) | ORAL | Status: DC | PRN
  Administered 2019-10-22: 11:00:00 5 mg via ORAL
  Filled 2019-10-22: qty 1

## 2019-10-22 MED ORDER — CYCLOBENZAPRINE HCL 5 MG PO TABS: 5.0000 mg | ORAL_TABLET | Freq: Three times a day (TID) | ORAL | 0 refills | Status: AC | PRN

## 2019-10-22 NOTE — Progress Notes (Signed)
Subjective:  Patient denies any chest pain or shortness of breath.  Eager to go home.  Noted to be back in A. fib with controlled ventricular response on the monitor  Objective:  Vital Signs in the last 24 hours: Temp:  [97.3 F (36.3 C)-97.8 F (36.6 C)] 97.3 F (36.3 C) (11/26 0700) Pulse Rate:  [66-74] 70 (11/26 0700) Resp:  [16-20] 17 (11/26 0700) BP: (116-162)/(66-97) 122/81 (11/26 0700) SpO2:  [96 %-100 %] 100 % (11/26 0700) Weight:  [104.7 kg] 104.7 kg (11/26 0429)  Intake/Output from previous day: 11/25 0701 - 11/26 0700 In: 240 [P.O.:240] Out: -  Intake/Output from this shift: Total I/O In: 200 [P.O.:200] Out: -   Physical Exam: Exam unchanged  Lab Results: Recent Labs    10/21/19 0233 10/22/19 0240  WBC 6.6 6.2  HGB 10.1* 10.5*  PLT 189 201   Recent Labs    10/21/19 0617 10/22/19 0240  NA 136 134*  K 4.8 4.4  CL 100 97*  CO2 23 25  GLUCOSE 85 87  BUN 14 22*  CREATININE 5.47* 7.43*   No results for input(s): TROPONINI in the last 72 hours.  Invalid input(s): CK, MB Hepatic Function Panel Recent Labs    10/20/19 1332  ALBUMIN 3.4*   No results for input(s): CHOL in the last 72 hours. No results for input(s): PROTIME in the last 72 hours.  Imaging:   Cardiac Studies:  Assessment/Plan:  Status post small,Acute non-Q-wave myocardial infarction.status post left cardiac catheterization/status post PCI to LAD and OM 2 Multivesselcalcific CAD , history of PCI to LAD and RCA in the past Ischemic cardiomyopathy. Status post new onset A. Fib with RVR. Hypertension Hyperlipidemia Hypothyroidism. Morbid obesity. End-stage renal disease on hemodialysis. History of nephrotic syndrome. Degenerative joint disease Plan Change carvedilol back to metoprolol tartrate 12.5 mg twice daily Continue p.o. amiodarone Okay to discharge from cardiac point of view Follow-up with me in 1 week We will consider starting anticoagulation along with Brilinta  after 1 month, continue aspirin and Brilinta for now and rest of her meds.  LOS: 9 days    Charolette Forward 10/22/2019, 10:56 AM

## 2019-10-22 NOTE — Progress Notes (Signed)
Subjective:  No new c/o   Objective Vital signs in last 24 hours: Vitals:   10/21/19 2310 10/22/19 0429 10/22/19 0700 10/22/19 0800  BP: (!) 162/66 (!) 131/97 122/81   Pulse: 66 71 70 66  Resp: 20 18 17 18   Temp: 97.8 F (36.6 C) (!) 97.4 F (36.3 C) (!) 97.3 F (36.3 C)   TempSrc: Oral Oral Oral   SpO2: 96% 97% 100% 100%  Weight:  104.7 kg    Height:       Weight change: -3.5 kg  Intake/Output Summary (Last 24 hours) at 10/22/2019 1117 Last data filed at 10/22/2019 0900 Gross per 24 hour  Intake 440 ml  Output -  Net 440 ml   Physical Exam: General: NAD-  Heart: irreg- slow  Lungs: mostly clear Abdomen: soft,non tender Extremities: chronic changes, no edema Dialysis Access: [facility]    Dialysis: [facility], [place] MWF   4h  102kg  2/2.5 bath  Hep 10,000  RIJ TDC  (P cath Dependant )     Calcitriol 0.52mcg  Po q hd Mirciera 50 mcg iv q 4 wks  ( last on 09/23/19)  Op hgb 10.5 (10/07/19 )   Assessment/Plan 1. Acute NonQ wave MI ( ho Multivessel  CAD sp LAD/ RCA  PCI in past.   Has multivessel disease. Not good CABG candidate. Underwent stenting of LAD and OM on 11/25. Possibly going home today per pt.  2. New A Fib with RVR -  per cards, amio and lopressor 3. ESRD - HD MWF via TDC 4. Hypertension/volume  - BP's ok, up 1kg  5. Anemia of ESRD= HGB 10.2-  No ESA for a while, got 60ug darbe on 11/22 here and repeat wkly 6. Metabolic bone disease -  Po vit d on hd and binder( renvela)  with meals Ca 8.4-  7. Hypothyroidism - HO  Chronic noncompliance with Levothyroxine in past    Kelly Splinter, MD 10/22/2019, 11:17 AM       Labs: Basic Metabolic Panel: Recent Labs  Lab 10/17/19 0152 10/18/19 0801 10/20/19 1332 10/21/19 0617 10/22/19 0240  NA 137 135 134* 136 134*  K 4.6 5.9* 4.2 4.8 4.4  CL 96* 98 97* 100 97*  CO2 28 27 25 23 25   GLUCOSE 99 86 94 85 87  BUN 16 31* 26* 14 22*  CREATININE 5.50* 7.56* 7.93* 5.47* 7.43*  CALCIUM 8.4* 8.5* 9.2 9.5 9.7  PHOS 5.3* 5.5*  3.9  --   --    Liver Function Tests: Recent Labs  Lab 10/17/19 0152 10/18/19 0801 10/20/19 1332  ALBUMIN 3.3* 3.1* 3.4*   No results for input(s): LIPASE, AMYLASE in the last 168 hours. No results for input(s): AMMONIA in the last 168 hours. CBC: Recent Labs  Lab 10/18/19 0247 10/19/19 0222 10/20/19 0239 10/21/19 0233 10/22/19 0240  WBC 6.7 5.8 6.8 6.6 6.2  HGB 10.2* 9.9* 10.4* 10.1* 10.5*  HCT 32.2* 30.8* 32.8* 30.6* 32.0*  MCV 98.2 95.4 95.3 93.6 95.2  PLT 193 176 179 189 201   Cardiac Enzymes: No results for input(s): CKTOTAL, CKMB, CKMBINDEX, TROPONINI in the last 168 hours. CBG: No results for input(s): GLUCAP in the last 168 hours.  Iron Studies: No results for input(s): IRON, TIBC, TRANSFERRIN, FERRITIN in the last 72 hours. Studies/Results: No results found. Medications: Infusions: . sodium chloride    . sodium chloride    . sodium chloride      Scheduled Medications: . amiodarone  200 mg Oral Daily  .  aspirin EC  81 mg Oral Daily  . atorvastatin  80 mg Oral q morning - 10a  . calcitRIOL  0.75 mcg Oral Q M,W,F-HD  . carvedilol  3.125 mg Oral BID WC  . Chlorhexidine Gluconate Cloth  6 each Topical Daily  . Chlorhexidine Gluconate Cloth  6 each Topical Q0600  . Chlorhexidine Gluconate Cloth  6 each Topical Q0600  . cyclobenzaprine  5 mg Oral Once  . levothyroxine  300 mcg Oral QHS  . nitroGLYCERIN  0.2 mg Transdermal Daily  . sevelamer carbonate  3,200 mg Oral TID WC  . sodium chloride flush  3 mL Intravenous Once  . sodium chloride flush  3 mL Intravenous Q12H  . sodium chloride flush  3 mL Intravenous Q12H  . ticagrelor  90 mg Oral BID    have reviewed scheduled and prn medications.

## 2019-10-22 NOTE — Discharge Summary (Signed)
Physician Discharge Summary  Lori [name] [id number] DOB: 04-16-1971 DOA: 10/12/2019  PCP: Patient, No Pcp Per  Admit date: 10/12/2019 Discharge date: 10/22/2019  Admitted From: Home Discharge disposition: Home   Code Status: Full Code  Diet Recommendation: Renal diet   Recommendations for Outpatient Follow-Up:   1. Follow-up with PCP as outpatient 2. Follow-up with nephrology as an outpatient 3. Follow-up with cardiology as an outpatient  Discharge Diagnosis:   Principal Problem:   ACS (acute coronary syndrome) ([place]) Active Problems:   Cardiovascular disease   GASTROESOPHAGEAL REFLUX DISEASE   ESRD on dialysis (HCC)   Hypothyroidism   Non-STEMI (non-ST elevated myocardial infarction) (HCC)   Lymphedema   Chronic venous insufficiency   End stage renal disease (HCC)   BMI 40.0-44.9, adult (HCC)   HTN (hypertension)   Chest pain   Atrial fibrillation (HCC)   ASCVD (arteriosclerotic cardiovascular disease)   History of Present Illness / Brief narrative:  Patient is a18 y.o.female,with h/o CAD s/p stent to LAD, hypertension, ESRD on HDMWF, hypothyroidism,hyperparathyroidism,GERD.   Patient presented to the hospital on 11/16 with chest pain, which started after she came back from hemodialysis. Chest pain did not improve with nitroglycerin and hence came to the ED. Patient's last heart cath was done by Dr. Terrence Dupont in 2019 with placement of drug-eluting stent in LAD. In the ED initial troponin was 44, repeat troponin was 331. Dr. Roderic Palau spoke to Dr. Terrence Dupont, who recommended that patient be started on heparin, full dose anticoagulation. She was also found to be in A. fib with RVR. Patient was admitted to hospitalist medicine service.  Cardiology consultation was called. 11/18, patient underwent cardiac catheterization.  Per cardiac cath note, patient has severe heavily calcified three-vessel coronary artery disease, severely tortuous. PCI options are limited because  of multiple lesions, calcification and tortuosity.  Cardiovascular consultation was obtained.  Patient was deemed not a candidate for CABG because of multiple comorbidities.  Underwent PCI with stenting of LAD and obtuse marginal on 11/25.  Hospital Course:  Acute coronary syndrome Multivessel coronary disease s/p PCI to LAD and RCA in the past Ischemic cardiomyopathy - LVEF 50% -patient presented with chest pain with elevated troponin. -11/18, cardiac cath.  Multivessel disease. Cardiovascular surgery consultation was obtained.  Patient was deemed not a candidate for CABG.  Patient underwent PCI with stenting of LAD and obtuse marginal on 11/25.  -Continue DAPT and statin.  Atrial fibrillation -On amiodarone and metoprolol for rate control.   ESRD on HD, MWF as well as holiday scheduled. -Nephrology consultation appreciated.  Hypertension - continue Cozaar, Coreg.  Muscle cramps, chronic pain -Improved with Flexeril.  Ordered for the same at discharge.  Hypothyroidism - continue Synthroid  Morbid obesity - Body mass index is 40.15 kg/m. Patient has been advised to make an attempt to improve diet and exercise patterns to aid in weight loss.  Stable for discharge to home today.  Subjective:  Seen and examined this morning.  Pleasant middle-aged female.  Propped up in bed.  Not in distress.  No chest pain, shortness of breath.  Ready to go home today.  Discharge Exam:   Vitals:   10/21/19 2310 10/22/19 0429 10/22/19 0700 10/22/19 0800  BP: (!) 162/66 (!) 131/97 122/81   Pulse: 66 71 70 66  Resp: 20 18 17 18   Temp: 97.8 F (36.6 C) (!) 97.4 F (36.3 C) (!) 97.3 F (36.3 C)   TempSrc: Oral Oral Oral   SpO2: 96% 97% 100% 100%  Weight:  104.7 kg    Height:        Body mass index is 40.89 kg/m.  General exam: Appears calm and comfortable.  Skin: No rashes, lesions or ulcers. HEENT: Atraumatic, normocephalic, supple neck, no obvious bleeding Lungs: Clear to  auscultation bilaterally CVS: Regular rate and rhythm, no murmur GI/Abd soft, nontender, nondistended, bowel sound present CNS: Alert, awake, oriented x3 Psychiatry: Mood appropriate Extremities: No pedal edema, no calf tenderness  Discharge Instructions:  Wound care: None Discharge Instructions    Amb Referral to Cardiac Rehabilitation   Complete by: As directed    Diagnosis:  Coronary Stents NSTEMI     After initial evaluation and assessments completed: Virtual Based Care may be provided alone or in conjunction with Phase 2 Cardiac Rehab based on patient barriers.: Yes   Diet - low sodium heart healthy   Complete by: As directed    Increase activity slowly   Complete by: As directed      Follow-up Information    Lightfoot, Lucile Crater, MD Follow up.   Specialty: Cardiothoracic Surgery Contact information: [place] [address] [phone]        Charolette Forward, MD Follow up.   Specialty: Cardiology Contact information: [place] [address] [phone]          Allergies as of 10/22/2019      Reactions   Activase [alteplase] Shortness Of Breath   Bee Pollen Anaphylaxis   Warfarin Sodium Nausea And Vomiting, Rash      Medication List    TAKE these medications   albuterol 108 (90 Base) MCG/ACT inhaler Commonly known as: VENTOLIN HFA Inhale 2 puffs into the lungs every 6 (six) hours as needed. Shortness of breath   amiodarone 200 MG tablet Commonly known as: PACERONE Take 1 tablet (200 mg total) by mouth daily.   aspirin 81 MG EC tablet Take 1 tablet (81 mg total) by mouth daily.   atorvastatin 80 MG tablet Commonly known as: LIPITOR Take 1 tablet (80 mg total) by mouth daily at 6 PM. What changed: when to take this   b complex-vitamin c-folic acid 0.8 MG Tabs tablet Take 1 tablet by mouth daily.   carvedilol 3.125 MG tablet Commonly known as: COREG Take 3.125 mg by mouth 2 (two) times  daily with a meal.   cyclobenzaprine 5 MG tablet Commonly known as: FLEXERIL Take 1 tablet (5 mg total) by mouth 3 (three) times daily as needed for up to 5 days for muscle spasms.   EPINEPHrine 0.3 mg/0.3 mL Soaj injection Commonly known as: EPI-PEN Inject 0.3 mg into the muscle as needed.   HYDROcodone-acetaminophen 5-325 MG tablet Commonly known as: NORCO/VICODIN One tablet every six hours for pain.  Limit 7 days. What changed:   how much to take  how to take this  when to take this  reasons to take this  additional instructions   ibuprofen 200 MG tablet Commonly known as: ADVIL Take 1,000 mg by mouth every morning. *May take additional dose in the evening as needed for pain   levothyroxine 300 MCG tablet Commonly known as: SYNTHROID Take 300 mcg by mouth at bedtime. SYNTHROID ONLY-BRAND NAME MEDICALLY NECESSARY   losartan 25 MG tablet Commonly known as: COZAAR Take 0.5 tablets (12.5 mg total) by mouth daily.   metoprolol tartrate 25 MG tablet Commonly known as: LOPRESSOR Take 0.5 tablets (12.5 mg total) by mouth 2 (two) times daily.  nitroGLYCERIN 0.4 MG SL tablet Commonly known as: NITROSTAT Place 1 tablet (0.4 mg total) under the tongue every 5 (five) minutes x 3 doses as needed for chest pain.   sevelamer carbonate 800 MG tablet Commonly known as: RENVELA Take 1,600-3,200 mg by mouth 3 (three) times daily with meals. 3200mg  with meals and 1600mg  with snacks   ticagrelor 90 MG Tabs tablet Commonly known as: BRILINTA Take 1 tablet (90 mg total) by mouth 2 (two) times daily.       Time coordinating discharge: 35 minutes  The results of significant diagnostics from this hospitalization (including imaging, microbiology, ancillary and laboratory) are listed below for reference.    Procedures and Diagnostic Studies:   Dg Chest Port 1 View  Result Date: 10/12/2019 CLINICAL DATA:  Chest pain EXAM: PORTABLE CHEST 1 VIEW COMPARISON:  02/02/2019 FINDINGS:  Right-sided central venous catheter tip over the right atrium. Clips at the thoracic inlet. Cardiomegaly without acute airspace disease, pleural effusion or edema. No pneumothorax. Right axillary vascular stents. IMPRESSION: No active disease.  Cardiomegaly. Electronically Signed   By: Donavan Foil M.D.   On: 10/12/2019 20:31     Labs:   Basic Metabolic Panel: Recent Labs  Lab 10/16/19 0823 10/17/19 0152 10/18/19 0801 10/20/19 0239 10/20/19 1332 10/21/19 0617 10/22/19 0240  NA 134* 137 135  --  134* 136 134*  K 4.5 4.6 5.9*  --  4.2 4.8 4.4  CL 94* 96* 98  --  97* 100 97*  CO2 28 28 27   --  25 23 25   GLUCOSE 114* 99 86  --  94 85 87  BUN 26* 16 31*  --  26* 14 22*  CREATININE 6.41* 5.50* 7.56*  --  7.93* 5.47* 7.43*  CALCIUM 8.0* 8.4* 8.5*  --  9.2 9.5 9.7  MG  --   --   --  2.1  --   --   --   PHOS 5.3* 5.3* 5.5*  --  3.9  --   --    GFR Estimated Creatinine Clearance: 10.6 mL/min (A) (by C-G formula based on SCr of 7.43 mg/dL (H)). Liver Function Tests: Recent Labs  Lab 10/16/19 0823 10/17/19 0152 10/18/19 0801 10/20/19 1332  ALBUMIN 3.2* 3.3* 3.1* 3.4*   No results for input(s): LIPASE, AMYLASE in the last 168 hours. No results for input(s): AMMONIA in the last 168 hours. Coagulation profile No results for input(s): INR, PROTIME in the last 168 hours.  CBC: Recent Labs  Lab 10/18/19 0247 10/19/19 0222 10/20/19 0239 10/21/19 0233 10/22/19 0240  WBC 6.7 5.8 6.8 6.6 6.2  HGB 10.2* 9.9* 10.4* 10.1* 10.5*  HCT 32.2* 30.8* 32.8* 30.6* 32.0*  MCV 98.2 95.4 95.3 93.6 95.2  PLT 193 176 179 189 201   Cardiac Enzymes: No results for input(s): CKTOTAL, CKMB, CKMBINDEX, TROPONINI in the last 168 hours. BNP: Invalid input(s): POCBNP CBG: No results for input(s): GLUCAP in the last 168 hours. D-Dimer No results for input(s): DDIMER in the last 72 hours. Hgb A1c No results for input(s): HGBA1C in the last 72 hours. Lipid Profile No results for input(s): CHOL,  HDL, LDLCALC, TRIG, CHOLHDL, LDLDIRECT in the last 72 hours. Thyroid function studies No results for input(s): TSH, T4TOTAL, T3FREE, THYROIDAB in the last 72 hours.  Invalid input(s): FREET3 Anemia work up No results for input(s): VITAMINB12, FOLATE, FERRITIN, TIBC, IRON, RETICCTPCT in the last 72 hours. Microbiology Recent Results (from the past 240 hour(s))  SARS CORONAVIRUS 2 (TAT 6-24 HRS)  Nasopharyngeal Nasopharyngeal Swab     Status: None   Collection Time: 10/12/19 10:37 PM   Specimen: Nasopharyngeal Swab  Result Value Ref Range Status   SARS Coronavirus 2 NEGATIVE NEGATIVE Final    Comment: (NOTE) SARS-CoV-2 target nucleic acids are NOT DETECTED. The SARS-CoV-2 RNA is generally detectable in upper and lower respiratory specimens during the acute phase of infection. Negative results do not preclude SARS-CoV-2 infection, do not rule out co-infections with other pathogens, and should not be used as the sole basis for treatment or other patient management decisions. Negative results must be combined with clinical observations, patient history, and epidemiological information. The expected result is Negative. Fact Sheet for Patients: SugarRoll.be Fact Sheet for Healthcare Providers: https://www.woods-mathews.com/ This test is not yet approved or cleared by the [place] FDA and  has been authorized for detection and/or diagnosis of SARS-CoV-2 by FDA under an Emergency Use Authorization (EUA). This EUA will remain  in effect (meaning this test can be used) for the duration of the COVID-19 declaration under Section 56 4(b)(1) of the Act, 21 U.S.C. section 360bbb-3(b)(1), unless the authorization is terminated or revoked sooner. Performed at [facility], [place] [address] [place], [place] [address]   MRSA PCR Screening     Status: None   Collection Time: 10/13/19  1:30 AM   Specimen: Nasal Mucosa; Nasopharyngeal  Result Value  Ref Range Status   MRSA by PCR NEGATIVE NEGATIVE Final    Comment:        The GeneXpert MRSA Assay (FDA approved for NASAL specimens only), is one component of a comprehensive MRSA colonization surveillance program. It is not intended to diagnose MRSA infection nor to guide or monitor treatment for MRSA infections. Performed at [facility], [address] [place], [place] [address]     Please note: You were cared for by a hospitalist during your hospital stay. Once you are discharged, your primary care physician will handle any further medical issues. Please note that NO REFILLS for any discharge medications will be authorized once you are discharged, as it is imperative that you return to your primary care physician (or establish a relationship with a primary care physician if you do not have one) for your post hospital discharge needs so that they can reassess your need for medications and monitor your lab values.  Signed: Terrilee Croak  [facility] 10/22/2019, 2:40 PM

## 2019-10-23 ENCOUNTER — Telehealth: Payer: Self-pay | Admitting: Physician Assistant

## 2019-10-23 ENCOUNTER — Encounter (HOSPITAL_COMMUNITY): Payer: Self-pay | Admitting: Cardiovascular Disease

## 2019-10-23 MED FILL — Nitroglycerin IV Soln 100 MCG/ML in D5W: INTRA_ARTERIAL | Qty: 10 | Status: AC

## 2019-10-23 NOTE — Telephone Encounter (Signed)
Transition of care from inpatient facility  Date of discharge: 10/22/19 Date of contact: 10/23/2019 Method of contact: Phone  Attempted to contact patient to discuss transition of care from in patient admission. Patient did not answer the phone. Message was left on the patient's voicemail.

## 2019-10-24 ENCOUNTER — Telehealth: Payer: Self-pay | Admitting: Physician Assistant

## 2019-10-24 NOTE — Telephone Encounter (Signed)
Transition of care from in-patient facility  Date of discharge: 10/22/2019 Date of contact: 10/24/2019 Method of contact: Phone  Patient contacted to discuss transition of care from inpatient admission. Second contact attempt. Patient did not answer the phone. Will plan to follow up at outpatient HD facility.

## 2019-11-03 ENCOUNTER — Encounter: Payer: Self-pay | Admitting: Orthopaedic Surgery

## 2019-11-03 ENCOUNTER — Ambulatory Visit (INDEPENDENT_AMBULATORY_CARE_PROVIDER_SITE_OTHER): Payer: Medicare Other | Admitting: Orthopaedic Surgery

## 2019-11-03 ENCOUNTER — Other Ambulatory Visit: Payer: Self-pay | Admitting: Orthopaedic Surgery

## 2019-11-03 ENCOUNTER — Other Ambulatory Visit: Payer: Self-pay

## 2019-11-03 VITALS — BP 138/98 | HR 117 | Temp 97.2°F | Ht 63.0 in | Wt 228.0 lb

## 2019-11-03 DIAGNOSIS — N186 End stage renal disease: Secondary | ICD-10-CM | POA: Diagnosis not present

## 2019-11-03 DIAGNOSIS — G8929 Other chronic pain: Secondary | ICD-10-CM | POA: Diagnosis not present

## 2019-11-03 DIAGNOSIS — Z6841 Body Mass Index (BMI) 40.0 and over, adult: Secondary | ICD-10-CM | POA: Diagnosis not present

## 2019-11-03 DIAGNOSIS — M25561 Pain in right knee: Secondary | ICD-10-CM | POA: Diagnosis not present

## 2019-11-03 DIAGNOSIS — Z992 Dependence on renal dialysis: Secondary | ICD-10-CM

## 2019-11-03 NOTE — Progress Notes (Signed)
PROCEDURE NOTE:  The patient requests injections of the right knee , verbal consent was obtained.  The right knee was prepped appropriately after time out was performed.   Sterile technique was observed and injection of 1 cc of Depo-Medrol 40 mg with several cc's of plain xylocaine. Anesthesia was provided by ethyl chloride and a 20-gauge needle was used to inject the knee area. The injection was tolerated well.  A band aid dressing was applied.  The patient was advised to apply ice later today and tomorrow to the injection sight as needed.  Return in two months.  Encounter Diagnoses  Name Primary?  . Chronic pain of right knee Yes  . ESRD on dialysis ([place])   . Morbid obesity ([place])   . Body mass index 40.0-44.9, adult [facility])    Electronically Signed Sanjuana Kava, MD 12/8/202010:26 AM

## 2019-11-05 ENCOUNTER — Ambulatory Visit: Payer: Medicare Other | Admitting: Orthopaedic Surgery

## 2019-12-15 ENCOUNTER — Telehealth: Payer: Self-pay | Admitting: Orthopaedic Surgery

## 2019-12-15 MED ORDER — HYDROCODONE-ACETAMINOPHEN 5-325 MG PO TABS: ORAL_TABLET | ORAL | 0 refills | Status: DC

## 2019-12-15 NOTE — Telephone Encounter (Signed)
Patient called for refill: HYDROcodone-acetaminophen (NORCO/VICODIN) 5-325 MG tablet 28 tab  - [employer]

## 2019-12-22 ENCOUNTER — Other Ambulatory Visit (HOSPITAL_COMMUNITY): Payer: Self-pay | Admitting: Nephrology

## 2019-12-22 ENCOUNTER — Other Ambulatory Visit: Payer: Self-pay

## 2019-12-22 ENCOUNTER — Ambulatory Visit (HOSPITAL_COMMUNITY)
Admission: RE | Admit: 2019-12-22 | Discharge: 2019-12-22 | Disposition: A | Discharge status: Home / Self Care | Payer: Medicare Other | Source: Ambulatory Visit | Attending: Nephrology | Admitting: Nephrology

## 2019-12-22 DIAGNOSIS — M25532 Pain in left wrist: Secondary | ICD-10-CM | POA: Diagnosis not present

## 2020-01-05 ENCOUNTER — Encounter: Payer: Self-pay | Admitting: Orthopaedic Surgery

## 2020-01-05 ENCOUNTER — Ambulatory Visit (INDEPENDENT_AMBULATORY_CARE_PROVIDER_SITE_OTHER): Payer: Medicare Other | Admitting: Orthopaedic Surgery

## 2020-01-05 ENCOUNTER — Other Ambulatory Visit: Payer: Self-pay

## 2020-01-05 VITALS — Temp 98.1°F | Resp 20 | Ht 63.0 in | Wt 219.0 lb

## 2020-01-05 DIAGNOSIS — M25561 Pain in right knee: Secondary | ICD-10-CM | POA: Diagnosis not present

## 2020-01-05 DIAGNOSIS — G8929 Other chronic pain: Secondary | ICD-10-CM

## 2020-01-05 DIAGNOSIS — M25532 Pain in left wrist: Secondary | ICD-10-CM

## 2020-01-05 NOTE — Progress Notes (Signed)
Patient ND:[id number] Lori [name], female DOB:1971/01/21, 50 y.o. [id number]  Chief Complaint  Patient presents with  . Knee Pain    right wants injection   . Wrist Pain    left for several months worse after pop 3 weeks ago    HPI  Lori [name] is a 49 y.o. female who has right knee pain and has developed left wrist pain.    Her right knee has swelling and popping but no giving way, no redness.  Her left wrist has been painful. She had x-rays done recently 12-22-2019 which showed: IMPRESSION: 1. No acute osseous abnormality, left wrist.  2. Mild osteoarthritis at the first [facility] joint.  I have reviewed the x-rays.  I have independently reviewed and interpreted x-rays of this patient done at another site by another physician or qualified health professional.  She says her wrist is slightly better but still hurts some.  Body mass index is 38.79 kg/m.  ROS  Review of Systems  All other systems reviewed and are negative.  The following is a summary of the past history medically, past history surgically, known current medicines, social history and family history.  This information is gathered electronically by the computer from prior information and documentation.  I review this each visit and have found including this information at this point in the chart is beneficial and informative.    Past Medical History:  Diagnosis Date  . Anemia   . ASCVD (arteriosclerotic cardiovascular disease)    a. s/p prior stenting of LAD b. NSTEMI in 01/2018 requiring DES x3 to RCA given spiral dissection from ostium to mid-RCA but residual disease along distal RCA, LAD and 2nd Mrg  . Calciphylaxis   . Chronic abdominal wound infection   . Dialysis patient ([place])   . ESRD (end stage renal disease) ([place])    Due to membranous GN dialysis 09/1996; peritoneal dialysis --? peitonitis; difficult vascular access  . GERD (gastroesophageal reflux disease)   . Hashimoto thyroiditis   . Hyperparathyroidism   .  Hypertension   . Hypothyroidism   . Medically noncompliant   . Morbid obesity ([place])     Past Surgical History:  Procedure Laterality Date  . [place]  . CORONARY STENT INTERVENTION N/A 10/21/2019   Procedure: CORONARY STENT INTERVENTION;  Surgeon: Wellington Hampshire, MD;  Location: [place] [facility];  Service: Cardiovascular;  Laterality: N/A;  . LEFT HEART CATH AND CORONARY ANGIOGRAPHY N/A 02/11/2018   Procedure: LEFT HEART CATH AND CORONARY ANGIOGRAPHY;  Surgeon: Charolette Forward, MD;  Location: [place] [facility];  Service: Cardiovascular;  Laterality: N/A;  . LEFT HEART CATH AND CORONARY ANGIOGRAPHY N/A 10/14/2019   Procedure: LEFT HEART CATH AND CORONARY ANGIOGRAPHY;  Surgeon: Wellington Hampshire, MD;  Location: [facility];  Service: Cardiovascular;  Laterality: N/A;  . THYROIDECTOMY, PARTIAL     Resectin of left lobe with reimplantation in the forearm, small focus of papillary carcinoma incidentally noted at pathology - 2000 and Hashimoto's thyrdoiditis in 2001  . TONSILLECTOMY AND ADENOIDECTOMY      Family History  Problem Relation Age of Onset  . Coronary artery disease Mother   . Kidney disease Father   . Diabetes Sister     Social History Social History   Tobacco Use  . Smoking status: Former Research scientist (life sciences)  . Smokeless tobacco: Never Used  Substance Use Topics  . Alcohol use: No  . Drug use: No    Allergies  Allergen Reactions  .  Activase [Alteplase] Shortness Of Breath  . Bee Pollen Anaphylaxis  . Warfarin Sodium Nausea And Vomiting and Rash    Current Outpatient Medications  Medication Sig Dispense Refill  . albuterol (PROVENTIL HFA;VENTOLIN HFA) 108 (90 BASE) MCG/ACT inhaler Inhale 2 puffs into the lungs every 6 (six) hours as needed. Shortness of breath    . atorvastatin (LIPITOR) 80 MG tablet Take 1 tablet (80 mg total) by mouth daily at 6 PM. (Patient taking differently: Take 80 mg by mouth every morning. ) 30 tablet 0  . b complex-vitamin c-folic acid  (NEPHRO-VITE) 0.8 MG TABS tablet Take 1 tablet by mouth daily.    . carvedilol (COREG) 3.125 MG tablet Take 3.125 mg by mouth 2 (two) times daily with a meal.     . EPINEPHrine 0.3 mg/0.3 mL IJ SOAJ injection Inject 0.3 mg into the muscle as needed.    [facility] HYDROcodone-acetaminophen (NORCO/VICODIN) 5-325 MG tablet TAKE (1) TABLET BY MOUTH EVERY (6) HOURS AS NEEDED. 28 tablet 0  . ibuprofen (ADVIL) 200 MG tablet Take 1,000 mg by mouth every morning. *May take additional dose in the evening as needed for pain    . levothyroxine (SYNTHROID, LEVOTHROID) 300 MCG tablet Take 300 mcg by mouth at bedtime. SYNTHROID ONLY-BRAND NAME MEDICALLY NECESSARY    . losartan (COZAAR) 25 MG tablet Take 0.5 tablets (12.5 mg total) by mouth daily. 30 tablet 3  . nitroGLYCERIN (NITROSTAT) 0.4 MG SL tablet Place 1 tablet (0.4 mg total) under the tongue every 5 (five) minutes x 3 doses as needed for chest pain. 25 tablet 12  . sevelamer carbonate (RENVELA) 800 MG tablet Take 1,600-3,200 mg by mouth 3 (three) times daily with meals. 3200mg  with meals and 1600mg  with snacks    . ticagrelor (BRILINTA) 90 MG TABS tablet Take 1 tablet (90 mg total) by mouth 2 (two) times daily. 60 tablet 3  . amiodarone (PACERONE) 200 MG tablet Take 1 tablet (200 mg total) by mouth daily. 30 tablet 0  . metoprolol tartrate (LOPRESSOR) 25 MG tablet Take 0.5 tablets (12.5 mg total) by mouth 2 (two) times daily. 30 tablet 0   No current facility-administered medications for this visit.     Physical Exam  Temperature 98.1 F (36.7 C), resp. rate 20, height 5\' 3"  (1.6 m), weight 219 lb (99.3 kg).  Constitutional: overall normal hygiene, normal nutrition, well developed, normal grooming, normal body habitus. Assistive device:none  Musculoskeletal: gait and station Limp right, muscle tone and strength are normal, no tremors or atrophy is present.  .  Neurological: coordination overall normal.  Deep tendon reflex/nerve stretch intact.  Sensation  normal.  Cranial nerves II-XII intact.   Skin:   Normal overall no scars, lesions, ulcers or rashes. No psoriasis.  Psychiatric: Alert and oriented x 3.  Recent memory intact, remote memory unclear.  Normal mood and affect. Well groomed.  Good eye contact.  Cardiovascular: overall no swelling, no varicosities, no edema bilaterally, normal temperatures of the legs and arms, no clubbing, cyanosis and good capillary refill.  Lymphatic: palpation is normal.  Left wrist is tender more dorsally and radial with near full ROM, no crepitus, no effusion. NV intact.  She has old graft site for dialysis.  Right knee has effusion, ROM 0 to 105, limp right stable, no distal edema, crepitus.  All other systems reviewed and are negative   The patient has been educated about the nature of the problem(s) and counseled on treatment options.  The patient appeared to understand  what I have discussed and is in agreement with it.  Encounter Diagnoses  Name Primary?  . Chronic pain of right knee Yes  . Left wrist pain    PROCEDURE NOTE:  The patient requests injections of the right knee , verbal consent was obtained.  The right knee was prepped appropriately after time out was performed.   Sterile technique was observed and injection of 1 cc of Depo-Medrol 40 mg with several cc's of plain xylocaine. Anesthesia was provided by ethyl chloride and a 20-gauge needle was used to inject the knee area. The injection was tolerated well.  A band aid dressing was applied.  The patient was advised to apply ice later today and tomorrow to the injection sight as needed.   PLAN Call if any problems.  Precautions discussed.  Continue current medications.   Return to clinic 1 month   Cock-up splint given left wrist.  Electronically Signed Sanjuana Kava, MD 2/9/202110:29 AM

## 2020-01-07 ENCOUNTER — Encounter (HOSPITAL_COMMUNITY): Payer: Medicare Other

## 2020-02-09 ENCOUNTER — Ambulatory Visit: Payer: Medicare Other | Admitting: Orthopaedic Surgery

## 2020-03-03 ENCOUNTER — Ambulatory Visit: Payer: Medicare Other | Admitting: Orthopaedic Surgery

## 2020-03-15 ENCOUNTER — Encounter: Payer: Self-pay | Admitting: Orthopaedic Surgery

## 2020-03-15 ENCOUNTER — Other Ambulatory Visit: Payer: Self-pay

## 2020-03-15 ENCOUNTER — Ambulatory Visit (INDEPENDENT_AMBULATORY_CARE_PROVIDER_SITE_OTHER): Payer: Medicare Other | Admitting: Orthopaedic Surgery

## 2020-03-15 DIAGNOSIS — M25561 Pain in right knee: Secondary | ICD-10-CM | POA: Diagnosis not present

## 2020-03-15 DIAGNOSIS — G8929 Other chronic pain: Secondary | ICD-10-CM

## 2020-03-15 NOTE — Progress Notes (Signed)
PROCEDURE NOTE:  The patient requests injections of the right knee , verbal consent was obtained.  The right knee was prepped appropriately after time out was performed.   Sterile technique was observed and injection of 1 cc of Depo-Medrol 40 mg with several cc's of plain xylocaine. Anesthesia was provided by ethyl chloride and a 20-gauge needle was used to inject the knee area. The injection was tolerated well.  A band aid dressing was applied.  The patient was advised to apply ice later today and tomorrow to the injection sight as needed.  Return in one month.  Electronically Signed Sanjuana Kava, MD 4/20/20219:39 AM

## 2020-03-16 ENCOUNTER — Telehealth: Payer: Self-pay | Admitting: Orthopaedic Surgery

## 2020-03-16 MED ORDER — HYDROCODONE-ACETAMINOPHEN 5-325 MG PO TABS: ORAL_TABLET | ORAL | 0 refills | Status: DC

## 2020-03-16 NOTE — Telephone Encounter (Signed)
Call received from patient today, following office visit yesterday, 03/15/20 - states Dr Luna Glasgow was to order refill of medication: HYDROcodone-acetaminophen (NORCO/VICODIN) 5-325 MG tablet 28 tablet  If so, pharmacy is [facility], [place]

## 2020-04-12 ENCOUNTER — Ambulatory Visit: Payer: Medicare Other | Admitting: Orthopaedic Surgery

## 2020-04-12 ENCOUNTER — Encounter: Payer: Self-pay | Admitting: Orthopaedic Surgery

## 2020-06-15 DIAGNOSIS — T782XXA Anaphylactic shock, unspecified, initial encounter: Secondary | ICD-10-CM | POA: Insufficient documentation

## 2020-06-15 DIAGNOSIS — T7840XA Allergy, unspecified, initial encounter: Secondary | ICD-10-CM | POA: Insufficient documentation

## 2020-06-21 ENCOUNTER — Telehealth: Payer: Self-pay | Admitting: Orthopaedic Surgery

## 2020-06-21 MED ORDER — HYDROCODONE-ACETAMINOPHEN 5-325 MG PO TABS: ORAL_TABLET | ORAL | 0 refills | Status: DC

## 2020-06-21 NOTE — Telephone Encounter (Signed)
Patient requests HYDROcodone-acetaminophen (NORCO/VICODIN) 5-325 MG tablet 28 tablet    States she uses [facility] [place],

## 2020-06-28 ENCOUNTER — Ambulatory Visit (INDEPENDENT_AMBULATORY_CARE_PROVIDER_SITE_OTHER): Payer: Medicare Other | Admitting: Orthopaedic Surgery

## 2020-06-28 ENCOUNTER — Encounter: Payer: Self-pay | Admitting: Orthopaedic Surgery

## 2020-06-28 ENCOUNTER — Other Ambulatory Visit: Payer: Self-pay

## 2020-06-28 VITALS — Ht 63.0 in | Wt 223.0 lb

## 2020-06-28 DIAGNOSIS — G8929 Other chronic pain: Secondary | ICD-10-CM | POA: Diagnosis not present

## 2020-06-28 DIAGNOSIS — M25561 Pain in right knee: Secondary | ICD-10-CM | POA: Diagnosis not present

## 2020-06-28 NOTE — Progress Notes (Signed)
PROCEDURE NOTE:  The patient requests injections of the right knee , verbal consent was obtained.  The right knee was prepped appropriately after time out was performed.   Sterile technique was observed and injection of 1 cc of Depo-Medrol 40 mg with several cc's of plain xylocaine. Anesthesia was provided by ethyl chloride and a 20-gauge needle was used to inject the knee area. The injection was tolerated well.  A band aid dressing was applied.  The patient was advised to apply ice later today and tomorrow to the injection sight as needed.  Return one month.  Call if any problem.  Precautions discussed.   Electronically Signed Sanjuana Kava, MD 8/3/20213:04 PM

## 2020-07-26 ENCOUNTER — Ambulatory Visit: Payer: Medicare Other | Admitting: Orthopaedic Surgery

## 2020-07-27 DIAGNOSIS — N2581 Secondary hyperparathyroidism of renal origin: Secondary | ICD-10-CM | POA: Diagnosis not present

## 2020-07-27 DIAGNOSIS — D631 Anemia in chronic kidney disease: Secondary | ICD-10-CM | POA: Diagnosis not present

## 2020-07-27 DIAGNOSIS — Z23 Encounter for immunization: Secondary | ICD-10-CM | POA: Diagnosis not present

## 2020-07-27 DIAGNOSIS — N186 End stage renal disease: Secondary | ICD-10-CM | POA: Diagnosis not present

## 2020-07-27 DIAGNOSIS — D689 Coagulation defect, unspecified: Secondary | ICD-10-CM | POA: Diagnosis not present

## 2020-07-27 DIAGNOSIS — Z992 Dependence on renal dialysis: Secondary | ICD-10-CM | POA: Diagnosis not present

## 2020-07-30 DIAGNOSIS — D689 Coagulation defect, unspecified: Secondary | ICD-10-CM | POA: Diagnosis not present

## 2020-07-30 DIAGNOSIS — D631 Anemia in chronic kidney disease: Secondary | ICD-10-CM | POA: Diagnosis not present

## 2020-07-30 DIAGNOSIS — Z992 Dependence on renal dialysis: Secondary | ICD-10-CM | POA: Diagnosis not present

## 2020-07-30 DIAGNOSIS — Z23 Encounter for immunization: Secondary | ICD-10-CM | POA: Diagnosis not present

## 2020-07-30 DIAGNOSIS — N186 End stage renal disease: Secondary | ICD-10-CM | POA: Diagnosis not present

## 2020-07-30 DIAGNOSIS — N2581 Secondary hyperparathyroidism of renal origin: Secondary | ICD-10-CM | POA: Diagnosis not present

## 2020-08-01 DIAGNOSIS — D689 Coagulation defect, unspecified: Secondary | ICD-10-CM | POA: Diagnosis not present

## 2020-08-01 DIAGNOSIS — Z992 Dependence on renal dialysis: Secondary | ICD-10-CM | POA: Diagnosis not present

## 2020-08-01 DIAGNOSIS — D631 Anemia in chronic kidney disease: Secondary | ICD-10-CM | POA: Diagnosis not present

## 2020-08-01 DIAGNOSIS — N2581 Secondary hyperparathyroidism of renal origin: Secondary | ICD-10-CM | POA: Diagnosis not present

## 2020-08-01 DIAGNOSIS — N186 End stage renal disease: Secondary | ICD-10-CM | POA: Diagnosis not present

## 2020-08-01 DIAGNOSIS — Z23 Encounter for immunization: Secondary | ICD-10-CM | POA: Diagnosis not present

## 2020-08-02 ENCOUNTER — Encounter (INDEPENDENT_AMBULATORY_CARE_PROVIDER_SITE_OTHER): Payer: Medicare Other

## 2020-08-02 ENCOUNTER — Ambulatory Visit (INDEPENDENT_AMBULATORY_CARE_PROVIDER_SITE_OTHER): Payer: Medicare Other | Admitting: Nurse Practitioner

## 2020-08-04 DIAGNOSIS — Z992 Dependence on renal dialysis: Secondary | ICD-10-CM | POA: Diagnosis not present

## 2020-08-04 DIAGNOSIS — D631 Anemia in chronic kidney disease: Secondary | ICD-10-CM | POA: Diagnosis not present

## 2020-08-04 DIAGNOSIS — N186 End stage renal disease: Secondary | ICD-10-CM | POA: Diagnosis not present

## 2020-08-04 DIAGNOSIS — D689 Coagulation defect, unspecified: Secondary | ICD-10-CM | POA: Diagnosis not present

## 2020-08-04 DIAGNOSIS — Z23 Encounter for immunization: Secondary | ICD-10-CM | POA: Diagnosis not present

## 2020-08-04 DIAGNOSIS — N2581 Secondary hyperparathyroidism of renal origin: Secondary | ICD-10-CM | POA: Diagnosis not present

## 2020-08-08 DIAGNOSIS — D631 Anemia in chronic kidney disease: Secondary | ICD-10-CM | POA: Diagnosis not present

## 2020-08-08 DIAGNOSIS — N2581 Secondary hyperparathyroidism of renal origin: Secondary | ICD-10-CM | POA: Diagnosis not present

## 2020-08-08 DIAGNOSIS — Z23 Encounter for immunization: Secondary | ICD-10-CM | POA: Diagnosis not present

## 2020-08-08 DIAGNOSIS — D689 Coagulation defect, unspecified: Secondary | ICD-10-CM | POA: Diagnosis not present

## 2020-08-08 DIAGNOSIS — Z992 Dependence on renal dialysis: Secondary | ICD-10-CM | POA: Diagnosis not present

## 2020-08-08 DIAGNOSIS — N186 End stage renal disease: Secondary | ICD-10-CM | POA: Diagnosis not present

## 2020-08-10 ENCOUNTER — Telehealth: Payer: Self-pay | Admitting: Orthopaedic Surgery

## 2020-08-10 DIAGNOSIS — D689 Coagulation defect, unspecified: Secondary | ICD-10-CM | POA: Diagnosis not present

## 2020-08-10 DIAGNOSIS — Z23 Encounter for immunization: Secondary | ICD-10-CM | POA: Diagnosis not present

## 2020-08-10 DIAGNOSIS — N2581 Secondary hyperparathyroidism of renal origin: Secondary | ICD-10-CM | POA: Diagnosis not present

## 2020-08-10 DIAGNOSIS — D631 Anemia in chronic kidney disease: Secondary | ICD-10-CM | POA: Diagnosis not present

## 2020-08-10 DIAGNOSIS — Z992 Dependence on renal dialysis: Secondary | ICD-10-CM | POA: Diagnosis not present

## 2020-08-10 DIAGNOSIS — N186 End stage renal disease: Secondary | ICD-10-CM | POA: Diagnosis not present

## 2020-08-10 NOTE — Telephone Encounter (Signed)
Patient called to (1) reschedule appointment from 07/26/20, which has been done as requested. Patient also requests refill: HYDROcodone-acetaminophen (NORCO/VICODIN) 5-325 MG tablet 24 tablet  -[facility]  - please advise

## 2020-08-11 MED ORDER — HYDROCODONE-ACETAMINOPHEN 5-325 MG PO TABS: ORAL_TABLET | ORAL | 0 refills | Status: DC

## 2020-08-12 DIAGNOSIS — Z23 Encounter for immunization: Secondary | ICD-10-CM | POA: Diagnosis not present

## 2020-08-12 DIAGNOSIS — D631 Anemia in chronic kidney disease: Secondary | ICD-10-CM | POA: Diagnosis not present

## 2020-08-12 DIAGNOSIS — D689 Coagulation defect, unspecified: Secondary | ICD-10-CM | POA: Diagnosis not present

## 2020-08-12 DIAGNOSIS — N186 End stage renal disease: Secondary | ICD-10-CM | POA: Diagnosis not present

## 2020-08-12 DIAGNOSIS — Z992 Dependence on renal dialysis: Secondary | ICD-10-CM | POA: Diagnosis not present

## 2020-08-12 DIAGNOSIS — N2581 Secondary hyperparathyroidism of renal origin: Secondary | ICD-10-CM | POA: Diagnosis not present

## 2020-08-15 DIAGNOSIS — D631 Anemia in chronic kidney disease: Secondary | ICD-10-CM | POA: Diagnosis not present

## 2020-08-15 DIAGNOSIS — N186 End stage renal disease: Secondary | ICD-10-CM | POA: Diagnosis not present

## 2020-08-15 DIAGNOSIS — Z23 Encounter for immunization: Secondary | ICD-10-CM | POA: Diagnosis not present

## 2020-08-15 DIAGNOSIS — Z992 Dependence on renal dialysis: Secondary | ICD-10-CM | POA: Diagnosis not present

## 2020-08-15 DIAGNOSIS — N2581 Secondary hyperparathyroidism of renal origin: Secondary | ICD-10-CM | POA: Diagnosis not present

## 2020-08-15 DIAGNOSIS — D689 Coagulation defect, unspecified: Secondary | ICD-10-CM | POA: Diagnosis not present

## 2020-08-17 DIAGNOSIS — D689 Coagulation defect, unspecified: Secondary | ICD-10-CM | POA: Diagnosis not present

## 2020-08-17 DIAGNOSIS — N2581 Secondary hyperparathyroidism of renal origin: Secondary | ICD-10-CM | POA: Diagnosis not present

## 2020-08-17 DIAGNOSIS — N186 End stage renal disease: Secondary | ICD-10-CM | POA: Diagnosis not present

## 2020-08-17 DIAGNOSIS — D631 Anemia in chronic kidney disease: Secondary | ICD-10-CM | POA: Diagnosis not present

## 2020-08-17 DIAGNOSIS — Z992 Dependence on renal dialysis: Secondary | ICD-10-CM | POA: Diagnosis not present

## 2020-08-17 DIAGNOSIS — Z23 Encounter for immunization: Secondary | ICD-10-CM | POA: Diagnosis not present

## 2020-08-18 ENCOUNTER — Ambulatory Visit: Payer: Medicare Other | Admitting: Orthopaedic Surgery

## 2020-08-19 DIAGNOSIS — Z992 Dependence on renal dialysis: Secondary | ICD-10-CM | POA: Diagnosis not present

## 2020-08-19 DIAGNOSIS — N186 End stage renal disease: Secondary | ICD-10-CM | POA: Diagnosis not present

## 2020-08-19 DIAGNOSIS — D689 Coagulation defect, unspecified: Secondary | ICD-10-CM | POA: Diagnosis not present

## 2020-08-19 DIAGNOSIS — N2581 Secondary hyperparathyroidism of renal origin: Secondary | ICD-10-CM | POA: Diagnosis not present

## 2020-08-19 DIAGNOSIS — Z23 Encounter for immunization: Secondary | ICD-10-CM | POA: Diagnosis not present

## 2020-08-19 DIAGNOSIS — D631 Anemia in chronic kidney disease: Secondary | ICD-10-CM | POA: Diagnosis not present

## 2020-08-22 DIAGNOSIS — D631 Anemia in chronic kidney disease: Secondary | ICD-10-CM | POA: Diagnosis not present

## 2020-08-22 DIAGNOSIS — Z992 Dependence on renal dialysis: Secondary | ICD-10-CM | POA: Diagnosis not present

## 2020-08-22 DIAGNOSIS — N186 End stage renal disease: Secondary | ICD-10-CM | POA: Diagnosis not present

## 2020-08-22 DIAGNOSIS — Z23 Encounter for immunization: Secondary | ICD-10-CM | POA: Diagnosis not present

## 2020-08-22 DIAGNOSIS — N2581 Secondary hyperparathyroidism of renal origin: Secondary | ICD-10-CM | POA: Diagnosis not present

## 2020-08-22 DIAGNOSIS — D689 Coagulation defect, unspecified: Secondary | ICD-10-CM | POA: Diagnosis not present

## 2020-08-24 DIAGNOSIS — N186 End stage renal disease: Secondary | ICD-10-CM | POA: Diagnosis not present

## 2020-08-24 DIAGNOSIS — N2581 Secondary hyperparathyroidism of renal origin: Secondary | ICD-10-CM | POA: Diagnosis not present

## 2020-08-24 DIAGNOSIS — D631 Anemia in chronic kidney disease: Secondary | ICD-10-CM | POA: Diagnosis not present

## 2020-08-24 DIAGNOSIS — Z23 Encounter for immunization: Secondary | ICD-10-CM | POA: Diagnosis not present

## 2020-08-24 DIAGNOSIS — Z992 Dependence on renal dialysis: Secondary | ICD-10-CM | POA: Diagnosis not present

## 2020-08-24 DIAGNOSIS — D689 Coagulation defect, unspecified: Secondary | ICD-10-CM | POA: Diagnosis not present

## 2020-08-25 ENCOUNTER — Other Ambulatory Visit (INDEPENDENT_AMBULATORY_CARE_PROVIDER_SITE_OTHER): Payer: Self-pay | Admitting: Vascular Surgery

## 2020-08-25 ENCOUNTER — Ambulatory Visit (INDEPENDENT_AMBULATORY_CARE_PROVIDER_SITE_OTHER): Payer: Medicare Other | Admitting: Orthopaedic Surgery

## 2020-08-25 ENCOUNTER — Encounter: Payer: Self-pay | Admitting: Orthopaedic Surgery

## 2020-08-25 ENCOUNTER — Other Ambulatory Visit: Payer: Self-pay

## 2020-08-25 VITALS — Ht 63.0 in | Wt 223.0 lb

## 2020-08-25 DIAGNOSIS — Z992 Dependence on renal dialysis: Secondary | ICD-10-CM | POA: Diagnosis not present

## 2020-08-25 DIAGNOSIS — I120 Hypertensive chronic kidney disease with stage 5 chronic kidney disease or end stage renal disease: Secondary | ICD-10-CM | POA: Diagnosis not present

## 2020-08-25 DIAGNOSIS — G8929 Other chronic pain: Secondary | ICD-10-CM | POA: Diagnosis not present

## 2020-08-25 DIAGNOSIS — N189 Chronic kidney disease, unspecified: Secondary | ICD-10-CM

## 2020-08-25 DIAGNOSIS — N186 End stage renal disease: Secondary | ICD-10-CM | POA: Diagnosis not present

## 2020-08-25 DIAGNOSIS — M25561 Pain in right knee: Secondary | ICD-10-CM

## 2020-08-25 DIAGNOSIS — M25562 Pain in left knee: Secondary | ICD-10-CM | POA: Diagnosis not present

## 2020-08-25 NOTE — Progress Notes (Signed)
PROCEDURE NOTE:  The patient requests injections of the left knee , verbal consent was obtained.  The left knee was prepped appropriately after time out was performed.   Sterile technique was observed and injection of 1 cc of Depo-Medrol 40 mg with several cc's of plain xylocaine. Anesthesia was provided by ethyl chloride and a 20-gauge needle was used to inject the knee area. The injection was tolerated well.  A band aid dressing was applied.  The patient was advised to apply ice later today and tomorrow to the injection sight as needed.  PROCEDURE NOTE:  The patient requests injections of the right knee , verbal consent was obtained.  The right knee was prepped appropriately after time out was performed.   Sterile technique was observed and injection of 1 cc of Depo-Medrol 40 mg with several cc's of plain xylocaine. Anesthesia was provided by ethyl chloride and a 20-gauge needle was used to inject the knee area. The injection was tolerated well.  A band aid dressing was applied.  The patient was advised to apply ice later today and tomorrow to the injection sight as needed.  Return in one month.  Encounter Diagnoses  Name Primary?  . Chronic pain of right knee Yes  . Chronic pain of left knee   . ESRD on dialysis [facility])    Call if any problem.  Precautions discussed.   Electronically Signed Sanjuana Kava, MD 9/30/202110:12 AM

## 2020-08-30 DIAGNOSIS — D631 Anemia in chronic kidney disease: Secondary | ICD-10-CM | POA: Diagnosis not present

## 2020-08-30 DIAGNOSIS — N186 End stage renal disease: Secondary | ICD-10-CM | POA: Diagnosis not present

## 2020-08-30 DIAGNOSIS — Z992 Dependence on renal dialysis: Secondary | ICD-10-CM | POA: Diagnosis not present

## 2020-08-30 DIAGNOSIS — N2581 Secondary hyperparathyroidism of renal origin: Secondary | ICD-10-CM | POA: Diagnosis not present

## 2020-08-30 DIAGNOSIS — D509 Iron deficiency anemia, unspecified: Secondary | ICD-10-CM | POA: Diagnosis not present

## 2020-08-30 DIAGNOSIS — D689 Coagulation defect, unspecified: Secondary | ICD-10-CM | POA: Diagnosis not present

## 2020-09-01 ENCOUNTER — Ambulatory Visit (INDEPENDENT_AMBULATORY_CARE_PROVIDER_SITE_OTHER): Payer: Medicare Other

## 2020-09-01 ENCOUNTER — Other Ambulatory Visit: Payer: Self-pay

## 2020-09-01 ENCOUNTER — Ambulatory Visit (INDEPENDENT_AMBULATORY_CARE_PROVIDER_SITE_OTHER): Payer: Medicare Other | Admitting: Nurse Practitioner

## 2020-09-01 DIAGNOSIS — N2581 Secondary hyperparathyroidism of renal origin: Secondary | ICD-10-CM | POA: Diagnosis not present

## 2020-09-01 DIAGNOSIS — D631 Anemia in chronic kidney disease: Secondary | ICD-10-CM | POA: Diagnosis not present

## 2020-09-01 DIAGNOSIS — Z992 Dependence on renal dialysis: Secondary | ICD-10-CM | POA: Diagnosis not present

## 2020-09-01 DIAGNOSIS — D689 Coagulation defect, unspecified: Secondary | ICD-10-CM | POA: Diagnosis not present

## 2020-09-01 DIAGNOSIS — D509 Iron deficiency anemia, unspecified: Secondary | ICD-10-CM | POA: Diagnosis not present

## 2020-09-01 DIAGNOSIS — N186 End stage renal disease: Secondary | ICD-10-CM | POA: Diagnosis not present

## 2020-09-02 ENCOUNTER — Telehealth: Payer: Self-pay | Admitting: Orthopaedic Surgery

## 2020-09-02 DIAGNOSIS — D509 Iron deficiency anemia, unspecified: Secondary | ICD-10-CM | POA: Diagnosis not present

## 2020-09-02 DIAGNOSIS — N186 End stage renal disease: Secondary | ICD-10-CM | POA: Diagnosis not present

## 2020-09-02 DIAGNOSIS — Z992 Dependence on renal dialysis: Secondary | ICD-10-CM | POA: Diagnosis not present

## 2020-09-02 DIAGNOSIS — D689 Coagulation defect, unspecified: Secondary | ICD-10-CM | POA: Diagnosis not present

## 2020-09-02 DIAGNOSIS — D631 Anemia in chronic kidney disease: Secondary | ICD-10-CM | POA: Diagnosis not present

## 2020-09-02 DIAGNOSIS — N2581 Secondary hyperparathyroidism of renal origin: Secondary | ICD-10-CM | POA: Diagnosis not present

## 2020-09-02 NOTE — Telephone Encounter (Signed)
Patient called needs refill on pain medicine   HYDROcodone-Acetaminophen (Tab) NORCO/VICODIN 5-325 MG TAKE (1) TABLET BY MOUTH EVERY (6) HOURS AS NEEDED.    [facility]

## 2020-09-05 DIAGNOSIS — D631 Anemia in chronic kidney disease: Secondary | ICD-10-CM | POA: Diagnosis not present

## 2020-09-05 DIAGNOSIS — N2581 Secondary hyperparathyroidism of renal origin: Secondary | ICD-10-CM | POA: Diagnosis not present

## 2020-09-05 DIAGNOSIS — N186 End stage renal disease: Secondary | ICD-10-CM | POA: Diagnosis not present

## 2020-09-05 DIAGNOSIS — Z992 Dependence on renal dialysis: Secondary | ICD-10-CM | POA: Diagnosis not present

## 2020-09-05 DIAGNOSIS — D689 Coagulation defect, unspecified: Secondary | ICD-10-CM | POA: Diagnosis not present

## 2020-09-05 DIAGNOSIS — D509 Iron deficiency anemia, unspecified: Secondary | ICD-10-CM | POA: Diagnosis not present

## 2020-09-05 MED ORDER — HYDROCODONE-ACETAMINOPHEN 5-325 MG PO TABS: ORAL_TABLET | ORAL | 0 refills | Status: DC

## 2020-09-08 DIAGNOSIS — Z992 Dependence on renal dialysis: Secondary | ICD-10-CM | POA: Diagnosis not present

## 2020-09-08 DIAGNOSIS — D689 Coagulation defect, unspecified: Secondary | ICD-10-CM | POA: Diagnosis not present

## 2020-09-08 DIAGNOSIS — D631 Anemia in chronic kidney disease: Secondary | ICD-10-CM | POA: Diagnosis not present

## 2020-09-08 DIAGNOSIS — D509 Iron deficiency anemia, unspecified: Secondary | ICD-10-CM | POA: Diagnosis not present

## 2020-09-08 DIAGNOSIS — N2581 Secondary hyperparathyroidism of renal origin: Secondary | ICD-10-CM | POA: Diagnosis not present

## 2020-09-08 DIAGNOSIS — N186 End stage renal disease: Secondary | ICD-10-CM | POA: Diagnosis not present

## 2020-09-09 DIAGNOSIS — D631 Anemia in chronic kidney disease: Secondary | ICD-10-CM | POA: Diagnosis not present

## 2020-09-09 DIAGNOSIS — Z992 Dependence on renal dialysis: Secondary | ICD-10-CM | POA: Diagnosis not present

## 2020-09-09 DIAGNOSIS — N186 End stage renal disease: Secondary | ICD-10-CM | POA: Diagnosis not present

## 2020-09-09 DIAGNOSIS — D689 Coagulation defect, unspecified: Secondary | ICD-10-CM | POA: Diagnosis not present

## 2020-09-09 DIAGNOSIS — N2581 Secondary hyperparathyroidism of renal origin: Secondary | ICD-10-CM | POA: Diagnosis not present

## 2020-09-09 DIAGNOSIS — D509 Iron deficiency anemia, unspecified: Secondary | ICD-10-CM | POA: Diagnosis not present

## 2020-09-12 DIAGNOSIS — D509 Iron deficiency anemia, unspecified: Secondary | ICD-10-CM | POA: Diagnosis not present

## 2020-09-12 DIAGNOSIS — D631 Anemia in chronic kidney disease: Secondary | ICD-10-CM | POA: Diagnosis not present

## 2020-09-12 DIAGNOSIS — D689 Coagulation defect, unspecified: Secondary | ICD-10-CM | POA: Diagnosis not present

## 2020-09-12 DIAGNOSIS — N186 End stage renal disease: Secondary | ICD-10-CM | POA: Diagnosis not present

## 2020-09-12 DIAGNOSIS — Z992 Dependence on renal dialysis: Secondary | ICD-10-CM | POA: Diagnosis not present

## 2020-09-12 DIAGNOSIS — N2581 Secondary hyperparathyroidism of renal origin: Secondary | ICD-10-CM | POA: Diagnosis not present

## 2020-09-14 DIAGNOSIS — N2581 Secondary hyperparathyroidism of renal origin: Secondary | ICD-10-CM | POA: Diagnosis not present

## 2020-09-14 DIAGNOSIS — N186 End stage renal disease: Secondary | ICD-10-CM | POA: Diagnosis not present

## 2020-09-14 DIAGNOSIS — D689 Coagulation defect, unspecified: Secondary | ICD-10-CM | POA: Diagnosis not present

## 2020-09-14 DIAGNOSIS — D631 Anemia in chronic kidney disease: Secondary | ICD-10-CM | POA: Diagnosis not present

## 2020-09-14 DIAGNOSIS — Z992 Dependence on renal dialysis: Secondary | ICD-10-CM | POA: Diagnosis not present

## 2020-09-14 DIAGNOSIS — D509 Iron deficiency anemia, unspecified: Secondary | ICD-10-CM | POA: Diagnosis not present

## 2020-09-16 DIAGNOSIS — N2581 Secondary hyperparathyroidism of renal origin: Secondary | ICD-10-CM | POA: Diagnosis not present

## 2020-09-16 DIAGNOSIS — D689 Coagulation defect, unspecified: Secondary | ICD-10-CM | POA: Diagnosis not present

## 2020-09-16 DIAGNOSIS — D631 Anemia in chronic kidney disease: Secondary | ICD-10-CM | POA: Diagnosis not present

## 2020-09-16 DIAGNOSIS — Z992 Dependence on renal dialysis: Secondary | ICD-10-CM | POA: Diagnosis not present

## 2020-09-16 DIAGNOSIS — D509 Iron deficiency anemia, unspecified: Secondary | ICD-10-CM | POA: Diagnosis not present

## 2020-09-16 DIAGNOSIS — N186 End stage renal disease: Secondary | ICD-10-CM | POA: Diagnosis not present

## 2020-09-20 DIAGNOSIS — D689 Coagulation defect, unspecified: Secondary | ICD-10-CM | POA: Diagnosis not present

## 2020-09-20 DIAGNOSIS — Z992 Dependence on renal dialysis: Secondary | ICD-10-CM | POA: Diagnosis not present

## 2020-09-20 DIAGNOSIS — D631 Anemia in chronic kidney disease: Secondary | ICD-10-CM | POA: Diagnosis not present

## 2020-09-20 DIAGNOSIS — D509 Iron deficiency anemia, unspecified: Secondary | ICD-10-CM | POA: Diagnosis not present

## 2020-09-20 DIAGNOSIS — N186 End stage renal disease: Secondary | ICD-10-CM | POA: Diagnosis not present

## 2020-09-20 DIAGNOSIS — N2581 Secondary hyperparathyroidism of renal origin: Secondary | ICD-10-CM | POA: Diagnosis not present

## 2020-09-21 DIAGNOSIS — N2581 Secondary hyperparathyroidism of renal origin: Secondary | ICD-10-CM | POA: Diagnosis not present

## 2020-09-21 DIAGNOSIS — D631 Anemia in chronic kidney disease: Secondary | ICD-10-CM | POA: Diagnosis not present

## 2020-09-21 DIAGNOSIS — Z992 Dependence on renal dialysis: Secondary | ICD-10-CM | POA: Diagnosis not present

## 2020-09-21 DIAGNOSIS — D509 Iron deficiency anemia, unspecified: Secondary | ICD-10-CM | POA: Diagnosis not present

## 2020-09-21 DIAGNOSIS — N186 End stage renal disease: Secondary | ICD-10-CM | POA: Diagnosis not present

## 2020-09-21 DIAGNOSIS — D689 Coagulation defect, unspecified: Secondary | ICD-10-CM | POA: Diagnosis not present

## 2020-09-22 ENCOUNTER — Ambulatory Visit: Payer: Medicare Other | Admitting: Orthopaedic Surgery

## 2020-09-24 DIAGNOSIS — N186 End stage renal disease: Secondary | ICD-10-CM | POA: Diagnosis not present

## 2020-09-24 DIAGNOSIS — N2581 Secondary hyperparathyroidism of renal origin: Secondary | ICD-10-CM | POA: Diagnosis not present

## 2020-09-24 DIAGNOSIS — D689 Coagulation defect, unspecified: Secondary | ICD-10-CM | POA: Diagnosis not present

## 2020-09-24 DIAGNOSIS — D631 Anemia in chronic kidney disease: Secondary | ICD-10-CM | POA: Diagnosis not present

## 2020-09-24 DIAGNOSIS — D509 Iron deficiency anemia, unspecified: Secondary | ICD-10-CM | POA: Diagnosis not present

## 2020-09-24 DIAGNOSIS — Z992 Dependence on renal dialysis: Secondary | ICD-10-CM | POA: Diagnosis not present

## 2020-09-28 DIAGNOSIS — N186 End stage renal disease: Secondary | ICD-10-CM | POA: Diagnosis not present

## 2020-09-28 DIAGNOSIS — D631 Anemia in chronic kidney disease: Secondary | ICD-10-CM | POA: Diagnosis not present

## 2020-09-28 DIAGNOSIS — N2581 Secondary hyperparathyroidism of renal origin: Secondary | ICD-10-CM | POA: Diagnosis not present

## 2020-09-28 DIAGNOSIS — Z992 Dependence on renal dialysis: Secondary | ICD-10-CM | POA: Diagnosis not present

## 2020-09-28 DIAGNOSIS — D689 Coagulation defect, unspecified: Secondary | ICD-10-CM | POA: Diagnosis not present

## 2020-09-30 DIAGNOSIS — D631 Anemia in chronic kidney disease: Secondary | ICD-10-CM | POA: Diagnosis not present

## 2020-09-30 DIAGNOSIS — Z992 Dependence on renal dialysis: Secondary | ICD-10-CM | POA: Diagnosis not present

## 2020-09-30 DIAGNOSIS — N186 End stage renal disease: Secondary | ICD-10-CM | POA: Diagnosis not present

## 2020-09-30 DIAGNOSIS — D689 Coagulation defect, unspecified: Secondary | ICD-10-CM | POA: Diagnosis not present

## 2020-09-30 DIAGNOSIS — N2581 Secondary hyperparathyroidism of renal origin: Secondary | ICD-10-CM | POA: Diagnosis not present

## 2020-10-03 DIAGNOSIS — U071 COVID-19: Secondary | ICD-10-CM | POA: Insufficient documentation

## 2020-10-04 DIAGNOSIS — N2581 Secondary hyperparathyroidism of renal origin: Secondary | ICD-10-CM | POA: Diagnosis not present

## 2020-10-04 DIAGNOSIS — N186 End stage renal disease: Secondary | ICD-10-CM | POA: Diagnosis not present

## 2020-10-04 DIAGNOSIS — D689 Coagulation defect, unspecified: Secondary | ICD-10-CM | POA: Diagnosis not present

## 2020-10-04 DIAGNOSIS — Z992 Dependence on renal dialysis: Secondary | ICD-10-CM | POA: Diagnosis not present

## 2020-10-04 DIAGNOSIS — D631 Anemia in chronic kidney disease: Secondary | ICD-10-CM | POA: Diagnosis not present

## 2020-10-06 DIAGNOSIS — D689 Coagulation defect, unspecified: Secondary | ICD-10-CM | POA: Diagnosis not present

## 2020-10-06 DIAGNOSIS — N2581 Secondary hyperparathyroidism of renal origin: Secondary | ICD-10-CM | POA: Diagnosis not present

## 2020-10-06 DIAGNOSIS — D631 Anemia in chronic kidney disease: Secondary | ICD-10-CM | POA: Diagnosis not present

## 2020-10-06 DIAGNOSIS — N186 End stage renal disease: Secondary | ICD-10-CM | POA: Diagnosis not present

## 2020-10-06 DIAGNOSIS — Z992 Dependence on renal dialysis: Secondary | ICD-10-CM | POA: Diagnosis not present

## 2020-10-07 DIAGNOSIS — D631 Anemia in chronic kidney disease: Secondary | ICD-10-CM | POA: Diagnosis not present

## 2020-10-07 DIAGNOSIS — Z992 Dependence on renal dialysis: Secondary | ICD-10-CM | POA: Diagnosis not present

## 2020-10-07 DIAGNOSIS — D689 Coagulation defect, unspecified: Secondary | ICD-10-CM | POA: Diagnosis not present

## 2020-10-07 DIAGNOSIS — N2581 Secondary hyperparathyroidism of renal origin: Secondary | ICD-10-CM | POA: Diagnosis not present

## 2020-10-07 DIAGNOSIS — N186 End stage renal disease: Secondary | ICD-10-CM | POA: Diagnosis not present

## 2020-10-10 DIAGNOSIS — N186 End stage renal disease: Secondary | ICD-10-CM | POA: Diagnosis not present

## 2020-10-10 DIAGNOSIS — D631 Anemia in chronic kidney disease: Secondary | ICD-10-CM | POA: Diagnosis not present

## 2020-10-10 DIAGNOSIS — N2581 Secondary hyperparathyroidism of renal origin: Secondary | ICD-10-CM | POA: Diagnosis not present

## 2020-10-10 DIAGNOSIS — Z992 Dependence on renal dialysis: Secondary | ICD-10-CM | POA: Diagnosis not present

## 2020-10-10 DIAGNOSIS — D689 Coagulation defect, unspecified: Secondary | ICD-10-CM | POA: Diagnosis not present

## 2020-10-12 DIAGNOSIS — Z992 Dependence on renal dialysis: Secondary | ICD-10-CM | POA: Diagnosis not present

## 2020-10-12 DIAGNOSIS — N2581 Secondary hyperparathyroidism of renal origin: Secondary | ICD-10-CM | POA: Diagnosis not present

## 2020-10-12 DIAGNOSIS — N186 End stage renal disease: Secondary | ICD-10-CM | POA: Diagnosis not present

## 2020-10-12 DIAGNOSIS — D689 Coagulation defect, unspecified: Secondary | ICD-10-CM | POA: Diagnosis not present

## 2020-10-12 DIAGNOSIS — D631 Anemia in chronic kidney disease: Secondary | ICD-10-CM | POA: Diagnosis not present

## 2020-10-15 DIAGNOSIS — Z992 Dependence on renal dialysis: Secondary | ICD-10-CM | POA: Diagnosis not present

## 2020-10-15 DIAGNOSIS — N2581 Secondary hyperparathyroidism of renal origin: Secondary | ICD-10-CM | POA: Diagnosis not present

## 2020-10-15 DIAGNOSIS — D631 Anemia in chronic kidney disease: Secondary | ICD-10-CM | POA: Diagnosis not present

## 2020-10-15 DIAGNOSIS — N186 End stage renal disease: Secondary | ICD-10-CM | POA: Diagnosis not present

## 2020-10-15 DIAGNOSIS — D689 Coagulation defect, unspecified: Secondary | ICD-10-CM | POA: Diagnosis not present

## 2020-10-16 DIAGNOSIS — N186 End stage renal disease: Secondary | ICD-10-CM | POA: Diagnosis not present

## 2020-10-16 DIAGNOSIS — D689 Coagulation defect, unspecified: Secondary | ICD-10-CM | POA: Diagnosis not present

## 2020-10-16 DIAGNOSIS — Z992 Dependence on renal dialysis: Secondary | ICD-10-CM | POA: Diagnosis not present

## 2020-10-16 DIAGNOSIS — D631 Anemia in chronic kidney disease: Secondary | ICD-10-CM | POA: Diagnosis not present

## 2020-10-16 DIAGNOSIS — N2581 Secondary hyperparathyroidism of renal origin: Secondary | ICD-10-CM | POA: Diagnosis not present

## 2020-10-21 DIAGNOSIS — Z992 Dependence on renal dialysis: Secondary | ICD-10-CM | POA: Diagnosis not present

## 2020-10-21 DIAGNOSIS — D689 Coagulation defect, unspecified: Secondary | ICD-10-CM | POA: Diagnosis not present

## 2020-10-21 DIAGNOSIS — D631 Anemia in chronic kidney disease: Secondary | ICD-10-CM | POA: Diagnosis not present

## 2020-10-21 DIAGNOSIS — N2581 Secondary hyperparathyroidism of renal origin: Secondary | ICD-10-CM | POA: Diagnosis not present

## 2020-10-21 DIAGNOSIS — N186 End stage renal disease: Secondary | ICD-10-CM | POA: Diagnosis not present

## 2020-10-24 DIAGNOSIS — D631 Anemia in chronic kidney disease: Secondary | ICD-10-CM | POA: Diagnosis not present

## 2020-10-24 DIAGNOSIS — N186 End stage renal disease: Secondary | ICD-10-CM | POA: Diagnosis not present

## 2020-10-24 DIAGNOSIS — N2581 Secondary hyperparathyroidism of renal origin: Secondary | ICD-10-CM | POA: Diagnosis not present

## 2020-10-24 DIAGNOSIS — D689 Coagulation defect, unspecified: Secondary | ICD-10-CM | POA: Diagnosis not present

## 2020-10-24 DIAGNOSIS — Z992 Dependence on renal dialysis: Secondary | ICD-10-CM | POA: Diagnosis not present

## 2020-10-25 DIAGNOSIS — Z992 Dependence on renal dialysis: Secondary | ICD-10-CM | POA: Diagnosis not present

## 2020-10-25 DIAGNOSIS — N186 End stage renal disease: Secondary | ICD-10-CM | POA: Diagnosis not present

## 2020-10-25 DIAGNOSIS — I120 Hypertensive chronic kidney disease with stage 5 chronic kidney disease or end stage renal disease: Secondary | ICD-10-CM | POA: Diagnosis not present

## 2020-10-26 DIAGNOSIS — D689 Coagulation defect, unspecified: Secondary | ICD-10-CM | POA: Diagnosis not present

## 2020-10-26 DIAGNOSIS — Z992 Dependence on renal dialysis: Secondary | ICD-10-CM | POA: Diagnosis not present

## 2020-10-26 DIAGNOSIS — N2581 Secondary hyperparathyroidism of renal origin: Secondary | ICD-10-CM | POA: Diagnosis not present

## 2020-10-26 DIAGNOSIS — N186 End stage renal disease: Secondary | ICD-10-CM | POA: Diagnosis not present

## 2020-10-29 DIAGNOSIS — N2581 Secondary hyperparathyroidism of renal origin: Secondary | ICD-10-CM | POA: Diagnosis not present

## 2020-10-29 DIAGNOSIS — N186 End stage renal disease: Secondary | ICD-10-CM | POA: Diagnosis not present

## 2020-10-29 DIAGNOSIS — D689 Coagulation defect, unspecified: Secondary | ICD-10-CM | POA: Diagnosis not present

## 2020-10-29 DIAGNOSIS — Z992 Dependence on renal dialysis: Secondary | ICD-10-CM | POA: Diagnosis not present

## 2020-10-31 DIAGNOSIS — Z992 Dependence on renal dialysis: Secondary | ICD-10-CM | POA: Diagnosis not present

## 2020-10-31 DIAGNOSIS — N186 End stage renal disease: Secondary | ICD-10-CM | POA: Diagnosis not present

## 2020-10-31 DIAGNOSIS — N2581 Secondary hyperparathyroidism of renal origin: Secondary | ICD-10-CM | POA: Diagnosis not present

## 2020-10-31 DIAGNOSIS — D689 Coagulation defect, unspecified: Secondary | ICD-10-CM | POA: Diagnosis not present

## 2020-11-01 ENCOUNTER — Telehealth: Payer: Self-pay | Admitting: Orthopaedic Surgery

## 2020-11-01 MED ORDER — HYDROCODONE-ACETAMINOPHEN 5-325 MG PO TABS: ORAL_TABLET | ORAL | 0 refills | Status: DC

## 2020-11-01 NOTE — Telephone Encounter (Signed)
Patient called to request refill on medication:   HYDROcodone-acetaminophen (NORCO/VICODIN) 5-325 MG tablet 24 tablet   - [place]    - patient did not keep last scheduled 4-week follow up appointment 09/22/20. Please advise.

## 2020-11-02 DIAGNOSIS — D689 Coagulation defect, unspecified: Secondary | ICD-10-CM | POA: Diagnosis not present

## 2020-11-02 DIAGNOSIS — Z992 Dependence on renal dialysis: Secondary | ICD-10-CM | POA: Diagnosis not present

## 2020-11-02 DIAGNOSIS — N186 End stage renal disease: Secondary | ICD-10-CM | POA: Diagnosis not present

## 2020-11-02 DIAGNOSIS — N2581 Secondary hyperparathyroidism of renal origin: Secondary | ICD-10-CM | POA: Diagnosis not present

## 2020-11-04 DIAGNOSIS — N2581 Secondary hyperparathyroidism of renal origin: Secondary | ICD-10-CM | POA: Diagnosis not present

## 2020-11-04 DIAGNOSIS — Z992 Dependence on renal dialysis: Secondary | ICD-10-CM | POA: Diagnosis not present

## 2020-11-04 DIAGNOSIS — N186 End stage renal disease: Secondary | ICD-10-CM | POA: Diagnosis not present

## 2020-11-04 DIAGNOSIS — D689 Coagulation defect, unspecified: Secondary | ICD-10-CM | POA: Diagnosis not present

## 2020-11-07 DIAGNOSIS — M25579 Pain in unspecified ankle and joints of unspecified foot: Secondary | ICD-10-CM | POA: Diagnosis not present

## 2020-11-07 DIAGNOSIS — M79675 Pain in left toe(s): Secondary | ICD-10-CM | POA: Diagnosis not present

## 2020-11-07 DIAGNOSIS — M79672 Pain in left foot: Secondary | ICD-10-CM | POA: Diagnosis not present

## 2020-11-07 DIAGNOSIS — L03032 Cellulitis of left toe: Secondary | ICD-10-CM | POA: Diagnosis not present

## 2020-11-08 DIAGNOSIS — N186 End stage renal disease: Secondary | ICD-10-CM | POA: Diagnosis not present

## 2020-11-08 DIAGNOSIS — Z992 Dependence on renal dialysis: Secondary | ICD-10-CM | POA: Diagnosis not present

## 2020-11-08 DIAGNOSIS — N2581 Secondary hyperparathyroidism of renal origin: Secondary | ICD-10-CM | POA: Diagnosis not present

## 2020-11-08 DIAGNOSIS — D689 Coagulation defect, unspecified: Secondary | ICD-10-CM | POA: Diagnosis not present

## 2020-11-09 DIAGNOSIS — D689 Coagulation defect, unspecified: Secondary | ICD-10-CM | POA: Diagnosis not present

## 2020-11-09 DIAGNOSIS — N2581 Secondary hyperparathyroidism of renal origin: Secondary | ICD-10-CM | POA: Diagnosis not present

## 2020-11-09 DIAGNOSIS — N186 End stage renal disease: Secondary | ICD-10-CM | POA: Diagnosis not present

## 2020-11-09 DIAGNOSIS — Z992 Dependence on renal dialysis: Secondary | ICD-10-CM | POA: Diagnosis not present

## 2020-11-11 DIAGNOSIS — N186 End stage renal disease: Secondary | ICD-10-CM | POA: Diagnosis not present

## 2020-11-11 DIAGNOSIS — N2581 Secondary hyperparathyroidism of renal origin: Secondary | ICD-10-CM | POA: Diagnosis not present

## 2020-11-11 DIAGNOSIS — Z992 Dependence on renal dialysis: Secondary | ICD-10-CM | POA: Diagnosis not present

## 2020-11-11 DIAGNOSIS — D689 Coagulation defect, unspecified: Secondary | ICD-10-CM | POA: Diagnosis not present

## 2020-11-14 DIAGNOSIS — L02612 Cutaneous abscess of left foot: Secondary | ICD-10-CM | POA: Diagnosis not present

## 2020-11-14 DIAGNOSIS — M79672 Pain in left foot: Secondary | ICD-10-CM | POA: Diagnosis not present

## 2020-11-16 DIAGNOSIS — Z992 Dependence on renal dialysis: Secondary | ICD-10-CM | POA: Diagnosis not present

## 2020-11-16 DIAGNOSIS — N186 End stage renal disease: Secondary | ICD-10-CM | POA: Diagnosis not present

## 2020-11-16 DIAGNOSIS — D689 Coagulation defect, unspecified: Secondary | ICD-10-CM | POA: Diagnosis not present

## 2020-11-16 DIAGNOSIS — N2581 Secondary hyperparathyroidism of renal origin: Secondary | ICD-10-CM | POA: Diagnosis not present

## 2020-11-18 DIAGNOSIS — Z992 Dependence on renal dialysis: Secondary | ICD-10-CM | POA: Diagnosis not present

## 2020-11-18 DIAGNOSIS — N186 End stage renal disease: Secondary | ICD-10-CM | POA: Diagnosis not present

## 2020-11-18 DIAGNOSIS — N2581 Secondary hyperparathyroidism of renal origin: Secondary | ICD-10-CM | POA: Diagnosis not present

## 2020-11-18 DIAGNOSIS — D689 Coagulation defect, unspecified: Secondary | ICD-10-CM | POA: Diagnosis not present

## 2020-11-21 DIAGNOSIS — D689 Coagulation defect, unspecified: Secondary | ICD-10-CM | POA: Diagnosis not present

## 2020-11-21 DIAGNOSIS — Z992 Dependence on renal dialysis: Secondary | ICD-10-CM | POA: Diagnosis not present

## 2020-11-21 DIAGNOSIS — N2581 Secondary hyperparathyroidism of renal origin: Secondary | ICD-10-CM | POA: Diagnosis not present

## 2020-11-21 DIAGNOSIS — N186 End stage renal disease: Secondary | ICD-10-CM | POA: Diagnosis not present

## 2020-11-24 DIAGNOSIS — Z992 Dependence on renal dialysis: Secondary | ICD-10-CM | POA: Diagnosis not present

## 2020-11-24 DIAGNOSIS — N2581 Secondary hyperparathyroidism of renal origin: Secondary | ICD-10-CM | POA: Diagnosis not present

## 2020-11-24 DIAGNOSIS — D689 Coagulation defect, unspecified: Secondary | ICD-10-CM | POA: Diagnosis not present

## 2020-11-24 DIAGNOSIS — N186 End stage renal disease: Secondary | ICD-10-CM | POA: Diagnosis not present

## 2020-11-25 DIAGNOSIS — N2581 Secondary hyperparathyroidism of renal origin: Secondary | ICD-10-CM | POA: Diagnosis not present

## 2020-11-25 DIAGNOSIS — I120 Hypertensive chronic kidney disease with stage 5 chronic kidney disease or end stage renal disease: Secondary | ICD-10-CM | POA: Diagnosis not present

## 2020-11-25 DIAGNOSIS — D689 Coagulation defect, unspecified: Secondary | ICD-10-CM | POA: Diagnosis not present

## 2020-11-25 DIAGNOSIS — Z992 Dependence on renal dialysis: Secondary | ICD-10-CM | POA: Diagnosis not present

## 2020-11-25 DIAGNOSIS — N186 End stage renal disease: Secondary | ICD-10-CM | POA: Diagnosis not present

## 2020-11-26 DIAGNOSIS — U071 COVID-19: Secondary | ICD-10-CM

## 2020-11-26 HISTORY — DX: COVID-19: U07.1

## 2020-11-29 ENCOUNTER — Telehealth: Payer: Self-pay | Admitting: Orthopaedic Surgery

## 2020-11-29 MED ORDER — HYDROCODONE-ACETAMINOPHEN 5-325 MG PO TABS: ORAL_TABLET | ORAL | 0 refills | Status: DC

## 2020-11-29 NOTE — Telephone Encounter (Signed)
Patient requests refill on Hydrocodone/Acetaminophen 5-325 mgs.  Qty  18  Sig: TAKE (1) TABLET BY MOUTH EVERY (6) HOURS AS NEEDED.  Patient states she uses [facility]

## 2020-11-30 DIAGNOSIS — Z992 Dependence on renal dialysis: Secondary | ICD-10-CM | POA: Diagnosis not present

## 2020-11-30 DIAGNOSIS — D689 Coagulation defect, unspecified: Secondary | ICD-10-CM | POA: Diagnosis not present

## 2020-11-30 DIAGNOSIS — N2581 Secondary hyperparathyroidism of renal origin: Secondary | ICD-10-CM | POA: Diagnosis not present

## 2020-11-30 DIAGNOSIS — N186 End stage renal disease: Secondary | ICD-10-CM | POA: Diagnosis not present

## 2020-12-02 DIAGNOSIS — Z992 Dependence on renal dialysis: Secondary | ICD-10-CM | POA: Diagnosis not present

## 2020-12-02 DIAGNOSIS — D689 Coagulation defect, unspecified: Secondary | ICD-10-CM | POA: Diagnosis not present

## 2020-12-02 DIAGNOSIS — N186 End stage renal disease: Secondary | ICD-10-CM | POA: Diagnosis not present

## 2020-12-02 DIAGNOSIS — N2581 Secondary hyperparathyroidism of renal origin: Secondary | ICD-10-CM | POA: Diagnosis not present

## 2020-12-05 DIAGNOSIS — N186 End stage renal disease: Secondary | ICD-10-CM | POA: Diagnosis not present

## 2020-12-05 DIAGNOSIS — D689 Coagulation defect, unspecified: Secondary | ICD-10-CM | POA: Diagnosis not present

## 2020-12-05 DIAGNOSIS — Z992 Dependence on renal dialysis: Secondary | ICD-10-CM | POA: Diagnosis not present

## 2020-12-05 DIAGNOSIS — N2581 Secondary hyperparathyroidism of renal origin: Secondary | ICD-10-CM | POA: Diagnosis not present

## 2020-12-06 ENCOUNTER — Ambulatory Visit: Payer: Medicaid Other | Admitting: Orthopaedic Surgery

## 2020-12-06 DIAGNOSIS — L89892 Pressure ulcer of other site, stage 2: Secondary | ICD-10-CM | POA: Diagnosis not present

## 2020-12-06 DIAGNOSIS — E1140 Type 2 diabetes mellitus with diabetic neuropathy, unspecified: Secondary | ICD-10-CM | POA: Diagnosis not present

## 2020-12-07 DIAGNOSIS — N186 End stage renal disease: Secondary | ICD-10-CM | POA: Diagnosis not present

## 2020-12-07 DIAGNOSIS — N2581 Secondary hyperparathyroidism of renal origin: Secondary | ICD-10-CM | POA: Diagnosis not present

## 2020-12-07 DIAGNOSIS — Z992 Dependence on renal dialysis: Secondary | ICD-10-CM | POA: Diagnosis not present

## 2020-12-07 DIAGNOSIS — D689 Coagulation defect, unspecified: Secondary | ICD-10-CM | POA: Diagnosis not present

## 2020-12-12 DIAGNOSIS — Z992 Dependence on renal dialysis: Secondary | ICD-10-CM | POA: Diagnosis not present

## 2020-12-12 DIAGNOSIS — D689 Coagulation defect, unspecified: Secondary | ICD-10-CM | POA: Diagnosis not present

## 2020-12-12 DIAGNOSIS — N186 End stage renal disease: Secondary | ICD-10-CM | POA: Diagnosis not present

## 2020-12-12 DIAGNOSIS — N2581 Secondary hyperparathyroidism of renal origin: Secondary | ICD-10-CM | POA: Diagnosis not present

## 2020-12-14 ENCOUNTER — Emergency Department (HOSPITAL_COMMUNITY): Payer: Medicare Other

## 2020-12-14 ENCOUNTER — Encounter (HOSPITAL_COMMUNITY): Payer: Self-pay | Admitting: Cardiology

## 2020-12-14 ENCOUNTER — Inpatient Hospital Stay (HOSPITAL_COMMUNITY)
Admission: EM | Admit: 2020-12-14 | Discharge: 2020-12-20 | DRG: 602 | Payer: Medicare Other | Attending: Family Medicine | Admitting: Family Medicine

## 2020-12-14 DIAGNOSIS — Z841 Family history of disorders of kidney and ureter: Secondary | ICD-10-CM | POA: Diagnosis not present

## 2020-12-14 DIAGNOSIS — W1830XA Fall on same level, unspecified, initial encounter: Secondary | ICD-10-CM | POA: Diagnosis present

## 2020-12-14 DIAGNOSIS — I10 Essential (primary) hypertension: Secondary | ICD-10-CM | POA: Diagnosis not present

## 2020-12-14 DIAGNOSIS — I2510 Atherosclerotic heart disease of native coronary artery without angina pectoris: Secondary | ICD-10-CM | POA: Diagnosis present

## 2020-12-14 DIAGNOSIS — Z91030 Bee allergy status: Secondary | ICD-10-CM | POA: Diagnosis not present

## 2020-12-14 DIAGNOSIS — R1084 Generalized abdominal pain: Secondary | ICD-10-CM | POA: Diagnosis not present

## 2020-12-14 DIAGNOSIS — I119 Hypertensive heart disease without heart failure: Secondary | ICD-10-CM | POA: Diagnosis not present

## 2020-12-14 DIAGNOSIS — I4891 Unspecified atrial fibrillation: Secondary | ICD-10-CM | POA: Diagnosis present

## 2020-12-14 DIAGNOSIS — B952 Enterococcus as the cause of diseases classified elsewhere: Secondary | ICD-10-CM | POA: Diagnosis not present

## 2020-12-14 DIAGNOSIS — I120 Hypertensive chronic kidney disease with stage 5 chronic kidney disease or end stage renal disease: Secondary | ICD-10-CM | POA: Diagnosis present

## 2020-12-14 DIAGNOSIS — M869 Osteomyelitis, unspecified: Secondary | ICD-10-CM

## 2020-12-14 DIAGNOSIS — S0990XA Unspecified injury of head, initial encounter: Secondary | ICD-10-CM | POA: Diagnosis not present

## 2020-12-14 DIAGNOSIS — W19XXXA Unspecified fall, initial encounter: Secondary | ICD-10-CM

## 2020-12-14 DIAGNOSIS — D689 Coagulation defect, unspecified: Secondary | ICD-10-CM | POA: Diagnosis not present

## 2020-12-14 DIAGNOSIS — N2581 Secondary hyperparathyroidism of renal origin: Secondary | ICD-10-CM | POA: Diagnosis not present

## 2020-12-14 DIAGNOSIS — S91302A Unspecified open wound, left foot, initial encounter: Secondary | ICD-10-CM | POA: Diagnosis not present

## 2020-12-14 DIAGNOSIS — J9601 Acute respiratory failure with hypoxia: Secondary | ICD-10-CM | POA: Diagnosis not present

## 2020-12-14 DIAGNOSIS — K625 Hemorrhage of anus and rectum: Secondary | ICD-10-CM | POA: Diagnosis not present

## 2020-12-14 DIAGNOSIS — I739 Peripheral vascular disease, unspecified: Secondary | ICD-10-CM | POA: Diagnosis present

## 2020-12-14 DIAGNOSIS — Z992 Dependence on renal dialysis: Secondary | ICD-10-CM

## 2020-12-14 DIAGNOSIS — E8889 Other specified metabolic disorders: Secondary | ICD-10-CM | POA: Diagnosis not present

## 2020-12-14 DIAGNOSIS — Z79890 Hormone replacement therapy (postmenopausal): Secondary | ICD-10-CM

## 2020-12-14 DIAGNOSIS — R109 Unspecified abdominal pain: Secondary | ICD-10-CM | POA: Diagnosis present

## 2020-12-14 DIAGNOSIS — Y838 Other surgical procedures as the cause of abnormal reaction of the patient, or of later complication, without mention of misadventure at the time of the procedure: Secondary | ICD-10-CM | POA: Diagnosis present

## 2020-12-14 DIAGNOSIS — Z7902 Long term (current) use of antithrombotics/antiplatelets: Secondary | ICD-10-CM

## 2020-12-14 DIAGNOSIS — Z87891 Personal history of nicotine dependence: Secondary | ICD-10-CM | POA: Diagnosis not present

## 2020-12-14 DIAGNOSIS — R079 Chest pain, unspecified: Secondary | ICD-10-CM | POA: Diagnosis not present

## 2020-12-14 DIAGNOSIS — Z7982 Long term (current) use of aspirin: Secondary | ICD-10-CM | POA: Diagnosis not present

## 2020-12-14 DIAGNOSIS — Z79899 Other long term (current) drug therapy: Secondary | ICD-10-CM | POA: Diagnosis not present

## 2020-12-14 DIAGNOSIS — L089 Local infection of the skin and subcutaneous tissue, unspecified: Secondary | ICD-10-CM

## 2020-12-14 DIAGNOSIS — R9431 Abnormal electrocardiogram [ECG] [EKG]: Secondary | ICD-10-CM | POA: Diagnosis not present

## 2020-12-14 DIAGNOSIS — E213 Hyperparathyroidism, unspecified: Secondary | ICD-10-CM | POA: Diagnosis not present

## 2020-12-14 DIAGNOSIS — E785 Hyperlipidemia, unspecified: Secondary | ICD-10-CM | POA: Diagnosis present

## 2020-12-14 DIAGNOSIS — D631 Anemia in chronic kidney disease: Secondary | ICD-10-CM | POA: Diagnosis not present

## 2020-12-14 DIAGNOSIS — Z6841 Body Mass Index (BMI) 40.0 and over, adult: Secondary | ICD-10-CM | POA: Diagnosis not present

## 2020-12-14 DIAGNOSIS — E039 Hypothyroidism, unspecified: Secondary | ICD-10-CM | POA: Diagnosis not present

## 2020-12-14 DIAGNOSIS — L03039 Cellulitis of unspecified toe: Secondary | ICD-10-CM | POA: Diagnosis not present

## 2020-12-14 DIAGNOSIS — I482 Chronic atrial fibrillation, unspecified: Secondary | ICD-10-CM | POA: Diagnosis present

## 2020-12-14 DIAGNOSIS — Z515 Encounter for palliative care: Secondary | ICD-10-CM | POA: Diagnosis not present

## 2020-12-14 DIAGNOSIS — I25118 Atherosclerotic heart disease of native coronary artery with other forms of angina pectoris: Secondary | ICD-10-CM | POA: Diagnosis not present

## 2020-12-14 DIAGNOSIS — Z888 Allergy status to other drugs, medicaments and biological substances status: Secondary | ICD-10-CM

## 2020-12-14 DIAGNOSIS — I96 Gangrene, not elsewhere classified: Secondary | ICD-10-CM | POA: Diagnosis not present

## 2020-12-14 DIAGNOSIS — Z9119 Patient's noncompliance with other medical treatment and regimen: Secondary | ICD-10-CM

## 2020-12-14 DIAGNOSIS — L03116 Cellulitis of left lower limb: Secondary | ICD-10-CM | POA: Diagnosis not present

## 2020-12-14 DIAGNOSIS — Z955 Presence of coronary angioplasty implant and graft: Secondary | ICD-10-CM

## 2020-12-14 DIAGNOSIS — E063 Autoimmune thyroiditis: Secondary | ICD-10-CM | POA: Diagnosis present

## 2020-12-14 DIAGNOSIS — Z79891 Long term (current) use of opiate analgesic: Secondary | ICD-10-CM

## 2020-12-14 DIAGNOSIS — L03032 Cellulitis of left toe: Secondary | ICD-10-CM | POA: Diagnosis not present

## 2020-12-14 DIAGNOSIS — I872 Venous insufficiency (chronic) (peripheral): Secondary | ICD-10-CM | POA: Diagnosis present

## 2020-12-14 DIAGNOSIS — T148XXA Other injury of unspecified body region, initial encounter: Secondary | ICD-10-CM

## 2020-12-14 DIAGNOSIS — R234 Changes in skin texture: Secondary | ICD-10-CM | POA: Diagnosis not present

## 2020-12-14 DIAGNOSIS — N250 Renal osteodystrophy: Secondary | ICD-10-CM | POA: Diagnosis not present

## 2020-12-14 DIAGNOSIS — I4819 Other persistent atrial fibrillation: Secondary | ICD-10-CM | POA: Diagnosis not present

## 2020-12-14 DIAGNOSIS — K219 Gastro-esophageal reflux disease without esophagitis: Secondary | ICD-10-CM | POA: Diagnosis present

## 2020-12-14 DIAGNOSIS — Z8673 Personal history of transient ischemic attack (TIA), and cerebral infarction without residual deficits: Secondary | ICD-10-CM

## 2020-12-14 DIAGNOSIS — I70234 Atherosclerosis of native arteries of right leg with ulceration of heel and midfoot: Secondary | ICD-10-CM | POA: Diagnosis not present

## 2020-12-14 DIAGNOSIS — Z8249 Family history of ischemic heart disease and other diseases of the circulatory system: Secondary | ICD-10-CM

## 2020-12-14 DIAGNOSIS — I480 Paroxysmal atrial fibrillation: Secondary | ICD-10-CM | POA: Diagnosis not present

## 2020-12-14 DIAGNOSIS — N186 End stage renal disease: Secondary | ICD-10-CM

## 2020-12-14 DIAGNOSIS — E669 Obesity, unspecified: Secondary | ICD-10-CM | POA: Diagnosis present

## 2020-12-14 DIAGNOSIS — S199XXA Unspecified injury of neck, initial encounter: Secondary | ICD-10-CM | POA: Diagnosis not present

## 2020-12-14 DIAGNOSIS — A419 Sepsis, unspecified organism: Secondary | ICD-10-CM | POA: Diagnosis not present

## 2020-12-14 DIAGNOSIS — I70263 Atherosclerosis of native arteries of extremities with gangrene, bilateral legs: Secondary | ICD-10-CM | POA: Diagnosis not present

## 2020-12-14 DIAGNOSIS — Z043 Encounter for examination and observation following other accident: Secondary | ICD-10-CM | POA: Diagnosis not present

## 2020-12-14 DIAGNOSIS — I959 Hypotension, unspecified: Secondary | ICD-10-CM | POA: Diagnosis not present

## 2020-12-14 DIAGNOSIS — I70262 Atherosclerosis of native arteries of extremities with gangrene, left leg: Secondary | ICD-10-CM | POA: Diagnosis not present

## 2020-12-14 DIAGNOSIS — R531 Weakness: Secondary | ICD-10-CM | POA: Diagnosis not present

## 2020-12-14 DIAGNOSIS — I252 Old myocardial infarction: Secondary | ICD-10-CM | POA: Diagnosis not present

## 2020-12-14 DIAGNOSIS — U071 COVID-19: Secondary | ICD-10-CM | POA: Diagnosis not present

## 2020-12-14 DIAGNOSIS — T82856A Stenosis of peripheral vascular stent, initial encounter: Secondary | ICD-10-CM | POA: Diagnosis not present

## 2020-12-14 DIAGNOSIS — Z7189 Other specified counseling: Secondary | ICD-10-CM | POA: Diagnosis not present

## 2020-12-14 DIAGNOSIS — Z9114 Patient's other noncompliance with medication regimen: Secondary | ICD-10-CM

## 2020-12-14 DIAGNOSIS — L97519 Non-pressure chronic ulcer of other part of right foot with unspecified severity: Secondary | ICD-10-CM | POA: Diagnosis not present

## 2020-12-14 DIAGNOSIS — R6521 Severe sepsis with septic shock: Secondary | ICD-10-CM | POA: Diagnosis not present

## 2020-12-14 DIAGNOSIS — R52 Pain, unspecified: Secondary | ICD-10-CM | POA: Diagnosis not present

## 2020-12-14 LAB — CBC WITH DIFFERENTIAL/PLATELET
Abs Immature Granulocytes: 0.38 10*3/uL — ABNORMAL HIGH (ref 0.00–0.07)
Basophils Absolute: 0.1 10*3/uL (ref 0.0–0.1)
Basophils Relative: 1 %
Eosinophils Absolute: 0.3 10*3/uL (ref 0.0–0.5)
Eosinophils Relative: 1 %
HCT: 35.1 % — ABNORMAL LOW (ref 36.0–46.0)
Hemoglobin: 11.4 g/dL — ABNORMAL LOW (ref 12.0–15.0)
Immature Granulocytes: 2 %
Lymphocytes Relative: 5 %
Lymphs Abs: 0.9 10*3/uL (ref 0.7–4.0)
MCH: 32.3 pg (ref 26.0–34.0)
MCHC: 32.5 g/dL (ref 30.0–36.0)
MCV: 99.4 fL (ref 80.0–100.0)
Monocytes Absolute: 1.9 10*3/uL — ABNORMAL HIGH (ref 0.1–1.0)
Monocytes Relative: 9 %
Neutro Abs: 16.9 10*3/uL — ABNORMAL HIGH (ref 1.7–7.7)
Neutrophils Relative %: 82 %
Platelets: 167 10*3/uL (ref 150–400)
RBC: 3.53 MIL/uL — ABNORMAL LOW (ref 3.87–5.11)
RDW: 17 % — ABNORMAL HIGH (ref 11.5–15.5)
WBC: 20.6 10*3/uL — ABNORMAL HIGH (ref 4.0–10.5)
nRBC: 0 % (ref 0.0–0.2)

## 2020-12-14 LAB — COMPREHENSIVE METABOLIC PANEL
ALT: 27 U/L (ref 0–44)
AST: 65 U/L — ABNORMAL HIGH (ref 15–41)
Albumin: 3.5 g/dL (ref 3.5–5.0)
Alkaline Phosphatase: 50 U/L (ref 38–126)
Anion gap: 17 — ABNORMAL HIGH (ref 5–15)
BUN: 40 mg/dL — ABNORMAL HIGH (ref 6–20)
CO2: 21 mmol/L — ABNORMAL LOW (ref 22–32)
Calcium: 8.2 mg/dL — ABNORMAL LOW (ref 8.9–10.3)
Chloride: 94 mmol/L — ABNORMAL LOW (ref 98–111)
Creatinine, Ser: 9.68 mg/dL — ABNORMAL HIGH (ref 0.44–1.00)
GFR, Estimated: 5 mL/min — ABNORMAL LOW (ref >60)
Glucose, Bld: 80 mg/dL (ref 70–99)
Potassium: 5.1 mmol/L (ref 3.5–5.1)
Sodium: 132 mmol/L — ABNORMAL LOW (ref 135–145)
Total Bilirubin: 0.6 mg/dL (ref 0.3–1.2)
Total Protein: 7.1 g/dL (ref 6.5–8.1)

## 2020-12-14 LAB — RESP PANEL BY RT-PCR (FLU A&B, COVID) ARPGX2
Influenza A by PCR: NEGATIVE
Influenza B by PCR: NEGATIVE
SARS Coronavirus 2 by RT PCR: POSITIVE — AB

## 2020-12-14 LAB — LACTIC ACID, PLASMA
Lactic Acid, Venous: 2 mmol/L (ref 0.5–1.9)
Lactic Acid, Venous: 1.8 mmol/L (ref 0.5–1.9)

## 2020-12-14 MED ORDER — SODIUM CHLORIDE 0.9 % IV BOLUS: 250.0000 mL | Freq: Once | INTRAVENOUS | Status: DC

## 2020-12-14 MED ORDER — VANCOMYCIN HCL 500 MG/100ML IV SOLN
500.0000 mg | Freq: Once | INTRAVENOUS | Status: AC
  Administered 2020-12-15: 500 mg via INTRAVENOUS
  Filled 2020-12-14: qty 100

## 2020-12-14 MED ORDER — HYDROCODONE-ACETAMINOPHEN 5-325 MG PO TABS
1.0000 | ORAL_TABLET | Freq: Once | ORAL | Status: AC
Start: 2020-12-14 — End: 2020-12-14
  Administered 2020-12-14: 1 via ORAL
  Filled 2020-12-14: qty 1

## 2020-12-14 MED ORDER — HYDROCODONE-ACETAMINOPHEN 5-325 MG PO TABS
2.0000 | ORAL_TABLET | ORAL | Status: DC | PRN
  Administered 2020-12-14: 2 via ORAL
  Filled 2020-12-14: qty 2

## 2020-12-14 MED ORDER — TICAGRELOR 90 MG PO TABS
90.0000 mg | ORAL_TABLET | Freq: Two times a day (BID) | ORAL | Status: DC
  Administered 2020-12-14 – 2020-12-20 (×12): 90 mg via ORAL
  Filled 2020-12-14 (×12): qty 1

## 2020-12-14 MED ORDER — VANCOMYCIN HCL 500 MG IV SOLR
INTRAVENOUS | Status: AC
  Filled 2020-12-14: qty 500

## 2020-12-14 MED ORDER — ASPIRIN EC 81 MG PO TBEC
81.0000 mg | DELAYED_RELEASE_TABLET | Freq: Every day | ORAL | Status: DC
  Administered 2020-12-15 – 2020-12-20 (×6): 81 mg via ORAL
  Filled 2020-12-14 (×5): qty 1

## 2020-12-14 MED ORDER — ATORVASTATIN CALCIUM 40 MG PO TABS: 80.0000 mg | ORAL_TABLET | Freq: Every day | ORAL | Status: DC

## 2020-12-14 MED ORDER — HEPARIN SODIUM (PORCINE) 5000 UNIT/ML IJ SOLN
5000.0000 [IU] | Freq: Three times a day (TID) | INTRAMUSCULAR | Status: DC
  Administered 2020-12-14 – 2020-12-20 (×17): 5000 [IU] via SUBCUTANEOUS
  Filled 2020-12-14 (×21): qty 1

## 2020-12-14 MED ORDER — MIDODRINE HCL 5 MG PO TABS
5.0000 mg | ORAL_TABLET | Freq: Three times a day (TID) | ORAL | Status: DC
  Administered 2020-12-15 (×2): 5 mg via ORAL
  Filled 2020-12-14 (×2): qty 1

## 2020-12-14 MED ORDER — VANCOMYCIN HCL IN DEXTROSE 1-5 GM/200ML-% IV SOLN
1000.0000 mg | Freq: Once | INTRAVENOUS | Status: AC
  Administered 2020-12-14: 1000 mg via INTRAVENOUS
  Filled 2020-12-14: qty 200

## 2020-12-14 MED ORDER — VANCOMYCIN HCL IN DEXTROSE 1-5 GM/200ML-% IV SOLN
1000.0000 mg | INTRAVENOUS | Status: DC
  Administered 2020-12-16 – 2020-12-19 (×2): 1000 mg via INTRAVENOUS
  Filled 2020-12-14 (×2): qty 200

## 2020-12-14 MED ORDER — VANCOMYCIN HCL IN DEXTROSE 1-5 GM/200ML-% IV SOLN
1000.0000 mg | Freq: Once | INTRAVENOUS | Status: DC
  Filled 2020-12-14: qty 200

## 2020-12-14 MED ORDER — SODIUM CHLORIDE 0.9 % IV SOLN: INTRAVENOUS | Status: AC

## 2020-12-14 MED ORDER — LEVOTHYROXINE SODIUM 100 MCG PO TABS
300.0000 ug | ORAL_TABLET | Freq: Every day | ORAL | Status: DC
  Administered 2020-12-14 – 2020-12-19 (×6): 300 ug via ORAL
  Filled 2020-12-14: qty 3
  Filled 2020-12-14: qty 6
  Filled 2020-12-14 (×4): qty 3

## 2020-12-14 MED ORDER — PIPERACILLIN-TAZOBACTAM 3.375 G IVPB 30 MIN
3.3750 g | Freq: Once | INTRAVENOUS | Status: AC
  Administered 2020-12-14: 3.375 g via INTRAVENOUS
  Filled 2020-12-14: qty 50

## 2020-12-14 MED ORDER — SEVELAMER CARBONATE 800 MG PO TABS: 1600.0000 mg | ORAL_TABLET | Freq: Three times a day (TID) | ORAL | Status: DC | PRN

## 2020-12-14 MED ORDER — METHOCARBAMOL 500 MG PO TABS
500.0000 mg | ORAL_TABLET | Freq: Once | ORAL | Status: AC
  Administered 2020-12-14: 500 mg via ORAL
  Filled 2020-12-14: qty 1

## 2020-12-14 MED ORDER — SODIUM CHLORIDE 0.9 % IV SOLN
2.0000 g | Freq: Once | INTRAVENOUS | Status: AC
  Administered 2020-12-14: 2 g via INTRAVENOUS
  Filled 2020-12-14: qty 2

## 2020-12-14 MED ORDER — SEVELAMER CARBONATE 800 MG PO TABS
3200.0000 mg | ORAL_TABLET | Freq: Three times a day (TID) | ORAL | Status: DC
  Administered 2020-12-15 – 2020-12-20 (×14): 3200 mg via ORAL
  Filled 2020-12-14 (×16): qty 4

## 2020-12-14 MED ORDER — PANTOPRAZOLE SODIUM 40 MG PO TBEC
40.0000 mg | DELAYED_RELEASE_TABLET | Freq: Every day | ORAL | Status: DC
  Administered 2020-12-14 – 2020-12-19 (×6): 40 mg via ORAL
  Filled 2020-12-14 (×6): qty 1

## 2020-12-14 MED ORDER — SODIUM CHLORIDE 0.9 % IV SOLN
100.0000 mg | INTRAVENOUS | Status: AC
  Administered 2020-12-15 (×2): 100 mg via INTRAVENOUS
  Filled 2020-12-14: qty 20

## 2020-12-14 MED ORDER — SODIUM CHLORIDE 0.9 % IV SOLN
2.0000 g | INTRAVENOUS | Status: DC
  Administered 2020-12-16 – 2020-12-19 (×2): 2 g via INTRAVENOUS
  Filled 2020-12-14: qty 2

## 2020-12-14 MED ORDER — SODIUM CHLORIDE 0.9 % IV SOLN
100.0000 mg | Freq: Every day | INTRAVENOUS | Status: DC
  Filled 2020-12-14: qty 20

## 2020-12-14 MED ORDER — METRONIDAZOLE IN NACL 5-0.79 MG/ML-% IV SOLN
500.0000 mg | Freq: Three times a day (TID) | INTRAVENOUS | Status: AC
  Administered 2020-12-14 – 2020-12-19 (×15): 500 mg via INTRAVENOUS
  Filled 2020-12-14 (×15): qty 100

## 2020-12-14 NOTE — Progress Notes (Addendum)
Pharmacy Antibiotic Note  Lori [name] is a 50 y.o. female admitted on 12/14/2020 with diabetic wound infection.  Pharmacy has been consulted for Vanco, Cefepime dosing. - +COVID  CC/HPI: fell on Butt,  Right hip and also hit her head, also (previously) dropped a can on foot 1 month ago. - CT abdomen pelvis negative for acute findings.  CT head and neck negative for acute findings.  Chest x-ray and pelvis x-ray viewed interpreted by me, no fracture or dislocation. Foot xray without osteo.   PMH: ACD, ESRD, ASCVD, calciphylaxis, chronic abdominal wound infection, GERD, Hashimoto thyroiditis, Hyperparathyroidism, HTN, Hypothyroidism, noncompliant, morbid obesity  ID: Diabetic wound infection. No osteo.WBC 20.6  Vanco 1/19>> Cefepime 1/19>>  Plan: Vanco '1500mg'$  IV x 1 then 1g IV q MWF Cefepime 2g IV x 1 now then 2g IV MWF  Add Remdesivir '200mg'$  tonight, then '100mg'$  IV x 4e.     Weight: 100.6 kg (221 lb 12.5 oz)  Temp (24hrs), Avg:98.4 F (36.9 C), Min:98.4 F (36.9 C), Max:98.4 F (36.9 C)  Recent Labs  Lab 12/14/20 1715  WBC 20.6*  CREATININE 9.68*  LATICACIDVEN 1.8    Estimated Creatinine Clearance: 7.9 mL/min (A) (by C-G formula based on SCr of 9.68 mg/dL (H)).    Allergies  Allergen Reactions  . Activase [Alteplase] Shortness Of Breath  . Bee Pollen Anaphylaxis  . Warfarin Sodium Nausea And Vomiting and Rash    Laneta Guerin S. Alford Highland, PharmD, BCPS Clinical Staff Pharmacist [email or web address]  Wayland Salinas 12/14/2020 7:41 PM

## 2020-12-14 NOTE — H&P (Signed)
TRH H&P   Patient Demographics:    Lori [name], is a 50 y.o. female  MRN: [id number]   DOB - 1971/06/24  Admit Date - 12/14/2020  Outpatient Primary MD for the patient is Patient, No Pcp Per  Referring MD/NP/PA: PA caccavale  Outpatient Specialists: renal Dr Kathe Mariner  Patient coming from: HD center  Chief Complaint  Patient presents with  . Fall      HPI:    Lori [name]  is a 50 y.o. female, past medical history of CAD status post stent to LAD and 2020, ESRD on hemodialysis Monday Wednesday Friday, followed by Dr. Raquel Sarna, hypertension, hypothyroidism, hyperparathyroidism, GERD. -Patient presents to ED status post fall, head pain, back pain and generalized weakness, patient reported mechanical fall as she was getting out of the [place] for her hemodialysis, she does report a fall with head trauma, denies any syncope or near syncope, for very generalized weak, fatigue, reports she did not finish her dialysis due to weakness, and she was sent to ED for further evaluation, any fever, chills, cough, shortness of breath, patient reports left foot wound for last month, she has been following by podiatry in [place], she finished antibiotic course last week, report wound is not improving, as well reports she has been out of her cardiac medications for last few weeks, and could not renew as she did not see her cardiologist (Dr. Terrence Dupont).  Patient denies any respiratory symptoms, no cough, no shortness of breath, reports she tested negative for COVID last week.  Her screening COVID-19 test came back positive in ED  -Presentation to ED, blood pressure was soft, white blood cell elevated at 20 K, but she had normal lactic acid, x-ray of infected foot with no evidence of osteomyelitis, hypotensive responded to fluid bolus, [facility] hospitalist consulted to admit.    Review of systems:    In addition to  the HPI above,  No Fever-chills, reports generalized weakness, fatigue No Headache, No changes with Vision or hearing, No problems swallowing food or Liquids, No Chest pain, Cough or Shortness of Breath, No Abdominal pain, No Nausea or Vommitting, Bowel movements are regular, No Blood in stool or Urine, No dysuria, Patient reports worsening left foot wound No new joints pains-aches,  No new weakness, tingling, numbness in any extremity, No recent weight gain or loss, No polyuria, polydypsia or polyphagia, No significant Mental Stressors.  A full 10 point Review of Systems was done, except as stated above, all other Review of Systems were negative.   With Past History of the following :    Past Medical History:  Diagnosis Date  . Anemia   . ASCVD (arteriosclerotic cardiovascular disease)    a. s/p prior stenting of LAD b. NSTEMI in 01/2018 requiring DES x3 to RCA given spiral dissection from ostium to mid-RCA but residual disease along distal RCA, LAD and 2nd Mrg  . Calciphylaxis   .  Chronic abdominal wound infection   . Dialysis patient ([place])   . ESRD (end stage renal disease) ([place])    Due to membranous GN dialysis 09/1996; peritoneal dialysis --? peitonitis; difficult vascular access  . GERD (gastroesophageal reflux disease)   . Hashimoto thyroiditis   . Hyperparathyroidism   . Hypertension   . Hypothyroidism   . Medically noncompliant   . Morbid obesity ([place])       Past Surgical History:  Procedure Laterality Date  . [place]  . CORONARY STENT INTERVENTION N/A 10/21/2019   Procedure: CORONARY STENT INTERVENTION;  Surgeon: Wellington Hampshire, MD;  Location: [facility];  Service: Cardiovascular;  Laterality: N/A;  . LEFT HEART CATH AND CORONARY ANGIOGRAPHY N/A 02/11/2018   Procedure: LEFT HEART CATH AND CORONARY ANGIOGRAPHY;  Surgeon: Charolette Forward, MD;  Location: [facility];  Service: Cardiovascular;  Laterality: N/A;  . LEFT HEART CATH AND CORONARY  ANGIOGRAPHY N/A 10/14/2019   Procedure: LEFT HEART CATH AND CORONARY ANGIOGRAPHY;  Surgeon: Wellington Hampshire, MD;  Location: [facility];  Service: Cardiovascular;  Laterality: N/A;  . THYROIDECTOMY, PARTIAL     Resectin of left lobe with reimplantation in the forearm, small focus of papillary carcinoma incidentally noted at pathology - 2000 and Hashimoto's thyrdoiditis in 2001  . TONSILLECTOMY AND ADENOIDECTOMY        Social History:     Social History   Tobacco Use  . Smoking status: Former Research scientist (life sciences)  . Smokeless tobacco: Never Used  Substance Use Topics  . Alcohol use: No      Family History :     Family History  Problem Relation Age of Onset  . Coronary artery disease Mother   . Kidney disease Father   . Diabetes Sister      Home Medications:   Prior to Admission medications   Medication Sig Start Date End Date Taking? Authorizing Provider  albuterol (PROVENTIL HFA;VENTOLIN HFA) 108 (90 BASE) MCG/ACT inhaler Inhale 2 puffs into the lungs every 6 (six) hours as needed. Shortness of breath   Yes [provider]  aspirin EC 81 MG tablet Take 81 mg by mouth daily. Swallow whole.   Yes [provider]  b complex-vitamin c-folic acid (NEPHRO-VITE) 0.8 MG TABS tablet Take 1 tablet by mouth daily.   Yes [provider]  gabapentin (NEURONTIN) 100 MG capsule Take 100 mg by mouth at bedtime. 11/11/20  Yes [provider]  HYDROcodone-acetaminophen (NORCO/VICODIN) 5-325 MG tablet TAKE (1) TABLET BY MOUTH EVERY (6) HOURS AS NEEDED. 11/29/20  Yes Sanjuana Kava, MD  ibuprofen (ADVIL) 200 MG tablet Take 1,000 mg by mouth every morning. *May take additional dose in the evening as needed for pain   Yes [provider]  levothyroxine (SYNTHROID, LEVOTHROID) 300 MCG tablet Take 300 mcg by mouth at bedtime. SYNTHROID ONLY-BRAND NAME MEDICALLY NECESSARY   Yes [provider]  Methoxy PEG-Epoetin Beta (MIRCERA IJ) Mircera 08/08/20  08/07/21 Yes [provider]  nitroGLYCERIN (NITROSTAT) 0.4 MG SL tablet Place 1 tablet (0.4 mg total) under the tongue every 5 (five) minutes x 3 doses as needed for chest pain. 02/14/18  Yes Charolette Forward, MD  sevelamer carbonate (RENVELA) 800 MG tablet Take 1,600-3,200 mg by mouth 3 (three) times daily with meals. '3200mg'$  with meals and '1600mg'$  with snacks   Yes [provider]  ticagrelor (BRILINTA) 90 MG TABS tablet Take 1 tablet (90 mg total) by mouth 2 (two) times daily. 02/14/18  Yes Charolette Forward, MD  amiodarone (PACERONE) 200 MG tablet Take 1 tablet (200 mg total) by mouth daily. Patient not taking: Reported on 12/14/2020 10/18/19 11/17/19  Terrilee Croak, MD  atorvastatin (LIPITOR) 80 MG tablet Take 1 tablet (80 mg total) by mouth daily at 6 PM. Patient not taking: Reported on 12/14/2020 02/14/18   Charolette Forward, MD  carvedilol (COREG) 3.125 MG tablet Take 3.125 mg by mouth 2 (two) times daily with a meal.  Patient not taking: Reported on 12/14/2020 07/28/19   [provider]  EPINEPHrine 0.3 mg/0.3 mL IJ SOAJ injection Inject 0.3 mg into the muscle as needed.    [provider]  losartan (COZAAR) 25 MG tablet Take 0.5 tablets (12.5 mg total) by mouth daily. Patient not taking: Reported on 12/14/2020 02/15/18   Charolette Forward, MD  metoprolol tartrate (LOPRESSOR) 25 MG tablet Take 0.5 tablets (12.5 mg total) by mouth 2 (two) times daily. Patient not taking: Reported on 12/14/2020 10/17/19 11/16/19  Terrilee Croak, MD  sulfamethoxazole-trimethoprim (BACTRIM DS) 800-160 MG tablet Take 1 tablet by mouth 2 (two) times daily. 11/07/20   [provider]     Allergies:     Allergies  Allergen Reactions  . Activase [Alteplase] Shortness Of Breath  . Bee Pollen Anaphylaxis  . Warfarin Sodium Nausea And Vomiting and Rash     Physical Exam:   Vitals  Blood pressure 127/78, pulse 66, temperature 98.4 F (36.9 C), temperature source Oral, resp. rate 16,  weight 100.6 kg, SpO2 100 %.   1. General female, laying in bed, in no apparent distress .  2. Normal affect and insight, Not Suicidal or Homicidal, Awake Alert, Oriented X 3.  3. No F.N deficits, ALL C.Nerves Intact, Strength 5/5 all 4 extremities, Sensation intact all 4 extremities, Plantars down going.  4. Ears and Eyes appear Normal, Conjunctivae clear, PERRLA. Moist Oral Mucosa.  5. Supple Neck, No JVD, No cervical lymphadenopathy appriciated, No Carotid Bruits.  6. Symmetrical Chest wall movement, Good air movement bilaterally, CTAB.  7. RRR, No Gallops, Rubs or Murmurs, No Parasternal Heave.  8. Positive Bowel Sounds, Abdomen Soft, No tenderness, No organomegaly appriciated,No rebound -guarding or rigidity.  9.  No Cyanosis, Normal Skin Turgor, foot wound, please see picture below  10. Good muscle tone,  joints appear normal , no effusions, Normal ROM.  11. No Palpable Lymph Nodes in Neck or Axillae           Data Review:    CBC Recent Labs  Lab 12/14/20 1715  WBC 20.6*  HGB 11.4*  HCT 35.1*  PLT 167  MCV 99.4  MCH 32.3  MCHC 32.5  RDW 17.0*  LYMPHSABS 0.9  MONOABS 1.9*  EOSABS 0.3  BASOSABS 0.1   ------------------------------------------------------------------------------------------------------------------  Chemistries  Recent Labs  Lab 12/14/20 1715  NA 132*  K 5.1  CL 94*  CO2 21*  GLUCOSE 80  BUN 40*  CREATININE 9.68*  CALCIUM 8.2*  AST 65*  ALT 27  ALKPHOS 50  BILITOT 0.6   ------------------------------------------------------------------------------------------------------------------ estimated creatinine clearance is 7.9 mL/min (A) (by C-G formula based on SCr of 9.68 mg/dL (H)). ------------------------------------------------------------------------------------------------------------------ No results for input(s): TSH, T4TOTAL, T3FREE, THYROIDAB in the last 72 hours.  Invalid input(s): FREET3  Coagulation profile No  results for input(s): INR, PROTIME in the last 168 hours. ------------------------------------------------------------------------------------------------------------------- No results for input(s): DDIMER in the last 72 hours. -------------------------------------------------------------------------------------------------------------------  Cardiac Enzymes No results for input(s): CKMB, TROPONINI, MYOGLOBIN in the last 168 hours.  Invalid  input(s): CK ------------------------------------------------------------------------------------------------------------------ No results found for: BNP   ---------------------------------------------------------------------------------------------------------------  Urinalysis No results found for: COLORURINE, APPEARANCEUR, LABSPEC, PHURINE, GLUCOSEU, HGBUR, BILIRUBINUR, KETONESUR, PROTEINUR, UROBILINOGEN, NITRITE, LEUKOCYTESUR  ----------------------------------------------------------------------------------------------------------------   Imaging Results:    CT ABDOMEN PELVIS WO CONTRAST  Result Date: 12/14/2020 CLINICAL DATA:  Generalized abdominal pain following fall, initial encounter EXAM: CT ABDOMEN AND PELVIS WITHOUT CONTRAST TECHNIQUE: Multidetector CT imaging of the abdomen and pelvis was performed following the standard protocol without IV contrast. COMPARISON:  05/03/2013 FINDINGS: Lower chest: No acute abnormality. Heavy coronary calcifications are noted. Hepatobiliary: Tiny dependent gallstones are noted. The gallbladder is otherwise within normal limits. Liver is unremarkable. Pancreas: Unremarkable. No pancreatic ductal dilatation or surrounding inflammatory changes. Spleen: Normal in size without focal abnormality. Adrenals/Urinary Tract: Adrenal glands are within normal limits. Kidneys demonstrate significant cortical thinning with multiple cysts the largest of which lies in the lower pole of the right kidney measuring 5.8 cm in  greatest dimension. This has increased in size from the prior exam at which time it measured 4.6 cm. No obstructive changes are seen. No definitive renal calculi are noted. The bladder is decompressed. These changes are consistent with the patient's known history of end-stage renal failure. Stomach/Bowel: Colon is well visualized without obstructive or inflammatory changes. The appendix is within normal limits. No small bowel or gastric abnormality is noted. Vascular/Lymphatic: Aortic atherosclerosis. No enlarged abdominal or pelvic lymph nodes. Left retroaortic renal vein is noted. Multiple collaterals are noted in the anterior abdominal wall likely related to central venous stenosis. These are stable from the prior exam. Calcified graft in the left thigh proximally is noted. Reproductive: Uterus and bilateral adnexa are unremarkable. Other: No abdominal wall hernia or abnormality. No abdominopelvic ascites. Musculoskeletal: No acute or significant osseous findings. IMPRESSION: Cholelithiasis without complicating factors. Changes consistent with end-stage renal failure with multiple renal cysts bilaterally and diffuse cortical thinning. Aortic atherosclerotic changes. Multiple venous collaterals in the anterior abdominal wall consistent with central venous stenosis likely related to the known dialysis. No acute abnormality related to the recent injury is noted. Electronically Signed   By: Inez Catalina M.D.   On: 12/14/2020 17:10   DG Chest 2 View  Result Date: 12/14/2020 CLINICAL DATA:  Pain following fall EXAM: CHEST - 2 VIEW COMPARISON:  October 12, 2019 chest radiograph and chest CT October 16, 2019 FINDINGS: Central catheter tip is in the superior vena cava. No pneumothorax. There is no edema or airspace opacity. Heart is mildly enlarged with pulmonary vascularity normal. No adenopathy. No bone lesions. Postoperative change in the thyroid region noted. IMPRESSION: Central catheter as described without  pneumothorax. Lungs clear. Heart mildly enlarged. Electronically Signed   By: Lowella Grip III M.D.   On: 12/14/2020 15:16   CT Head Wo Contrast  Result Date: 12/14/2020 CLINICAL DATA:  Fall.  Hit head. EXAM: CT HEAD WITHOUT CONTRAST CT CERVICAL SPINE WITHOUT CONTRAST TECHNIQUE: Multidetector CT imaging of the head and cervical spine was performed following the standard protocol without intravenous contrast. Multiplanar CT image reconstructions of the cervical spine were also generated. COMPARISON:  CT head dated June 15, 2010. FINDINGS: CT HEAD FINDINGS Brain: No evidence of acute infarction, hemorrhage, hydrocephalus, extra-axial collection or mass lesion/mass effect. Tiny old lacunar infarct in the right basal ganglia. Vascular: Calcified atherosclerosis at the skullbase. No hyperdense vessel. Skull: Thickened skull with diffuse sclerosis and patchy sclerotic foci, consistent with renal osteodystrophy. No fracture. Sinuses/Orbits: No acute finding. Other: None. CT CERVICAL SPINE FINDINGS Alignment: Mild  reversal of the normal cervical lordosis. No traumatic malalignment. Skull base and vertebrae: No acute fracture. No primary bone lesion or focal pathologic process. Soft tissues and spinal canal: No prevertebral fluid or swelling. No visible canal hematoma. Disc levels: Mild disc height loss at C6-C7. Mild right uncovertebral hypertrophy at C2-C3 and C3-C4. Upper chest: Negative. Other: Prior thyroidectomy. IMPRESSION: 1. No acute intracranial abnormality. Old right basal ganglia lacunar infarct. 2. No acute cervical spine fracture or traumatic listhesis. 3. Renal osteodystrophy. Electronically Signed   By: Titus Dubin M.D.   On: 12/14/2020 15:15   CT Cervical Spine Wo Contrast  Result Date: 12/14/2020 CLINICAL DATA:  Fall.  Hit head. EXAM: CT HEAD WITHOUT CONTRAST CT CERVICAL SPINE WITHOUT CONTRAST TECHNIQUE: Multidetector CT imaging of the head and cervical spine was performed following the  standard protocol without intravenous contrast. Multiplanar CT image reconstructions of the cervical spine were also generated. COMPARISON:  CT head dated June 15, 2010. FINDINGS: CT HEAD FINDINGS Brain: No evidence of acute infarction, hemorrhage, hydrocephalus, extra-axial collection or mass lesion/mass effect. Tiny old lacunar infarct in the right basal ganglia. Vascular: Calcified atherosclerosis at the skullbase. No hyperdense vessel. Skull: Thickened skull with diffuse sclerosis and patchy sclerotic foci, consistent with renal osteodystrophy. No fracture. Sinuses/Orbits: No acute finding. Other: None. CT CERVICAL SPINE FINDINGS Alignment: Mild reversal of the normal cervical lordosis. No traumatic malalignment. Skull base and vertebrae: No acute fracture. No primary bone lesion or focal pathologic process. Soft tissues and spinal canal: No prevertebral fluid or swelling. No visible canal hematoma. Disc levels: Mild disc height loss at C6-C7. Mild right uncovertebral hypertrophy at C2-C3 and C3-C4. Upper chest: Negative. Other: Prior thyroidectomy. IMPRESSION: 1. No acute intracranial abnormality. Old right basal ganglia lacunar infarct. 2. No acute cervical spine fracture or traumatic listhesis. 3. Renal osteodystrophy. Electronically Signed   By: Titus Dubin M.D.   On: 12/14/2020 15:15   DG Foot Complete Left  Result Date: 12/14/2020 CLINICAL DATA:  Wound on foot near fourth and fifth MTP EXAM: LEFT FOOT - COMPLETE 3+ VIEW COMPARISON:  None. FINDINGS: No fracture or malalignment. Vascular calcifications. No soft tissue emphysema or radiopaque foreign body. IMPRESSION: No acute osseous abnormality. Electronically Signed   By: Donavan Foil M.D.   On: 12/14/2020 15:18   DG Hips Bilat W or Wo Pelvis 3-4 Views  Result Date: 12/14/2020 CLINICAL DATA:  Fall, initial encounter. EXAM: DG HIP (WITH OR WITHOUT PELVIS) 3-4V BILAT COMPARISON:  None. FINDINGS: Image quality is degraded by body habitus. No  acute osseous or joint abnormality. Vascular stent is seen in the left femoral vasculature. IMPRESSION: Image quality is degraded by body habitus. No definite acute osseous or joint abnormality. Electronically Signed   By: Lorin Picket M.D.   On: 12/14/2020 15:17    My personal review of EKG: Pending   Assessment & Plan:    Active Problems:   ESRD on dialysis ([place])   BMI 40.0-44.9, adult (HCC)   Atrial fibrillation ([facility])   Hyperlipidemia, unspecified   Infected wound  Infected left foot wound  -She has significant leukocytosis, some mild discharge from her wound, she has been admitted under infected wound pathway, given she is hypotensive, with leukocytosis (normal lactic acid) she will be kept on broad-spectrum antibiotic vancomycin, cefepime and Flagyl, this can be narrowed once more stable. -Follow-up on blood cultures. -General surgery consulted to see if there is need for local debridement -Pressure remains soft, will give gentle hydration, and will admit to  stepdown, and will start in midodrine  COVID-19 infection -Incidental inding, reports she is vaccinated with a booster. -No hypoxia, no pneumonia on imaging. -will start on remdesivir given she is high risk, no indication for steroids   History  of CAD with multivessel coronary disease s/p PCI to LAD and RCA in the past Ischemic cardiomyopathy - LVEF 50% -She has been off her cardiac meds for a while now, she does not follow with cardiology, but I will certainly not resume back on her Coreg and losartan with a soft blood pressure . -Continue with aspirin and Brilinta .  Atrial fibrillation -Patient with history of A. fib, he is currently in normal sinus, heart rate in the 50s, she has not been taking her amiodarone for a while, so I will not start it .  ESRD on HD,MWF  -Nephrology consultation entered in epic  Hypertension  -been taking her medicine for few weeks currently, would not resume given low blood  pressure  Hypothyroidism  - continue Synthroid  Morbid obesity  -Body mass index is 40.15 kg/m.Patient has been advised to make an attempt to improve diet and exercise patterns to aid in weight loss.    DVT Prophylaxis Heparin  AM Labs Ordered, also please review Full Orders  Family Communication: Admission, patients condition and plan of care including tests being ordered have been discussed with the patient  who indicate understanding and agree with the plan and Code Status.  Code Status Full  Likely DC to  home  Condition GUARDED    Consults called: renal and general surgery consult placed in Epic.    Admission status: inpatient    Time spent in minutes : 60 minutes   Phillips Climes M.D on 12/14/2020 at 7:26 PM   [facility] - Office  [phone]

## 2020-12-14 NOTE — ED Triage Notes (Signed)
Fall today outside of dialysis unit.  Per EMS pt fell on Butt,  Right hip and also hit her head.  Pt does take aspirin and questionable other blood thinner for a-fib.   Pt also wants to be evaluated from previous injury of dropping a can on her left foot.  And also painful perineum.

## 2020-12-14 NOTE — TOC Initial Note (Signed)
Transition of Care [facility]) - Initial/Assessment Note    Patient Details  Name: Lori [name] MRN: [id number] Date of Birth: [birth year]  Transition of Care [facility]) CM/SW Contact:    Iona Beard, [place] Phone Number: 12/14/2020, 9:48 PM  Clinical Narrative:                 TOC consulted due to possible HH/DME needs. CSW spoke with pt over the phone to complete assessment. Pt states that she lives with her husband. Pt is independent in completing ADLs and drives. Pt states that she has had HH a few years ago but was unsure what company was used. Pt states that she has a cane, walker, and wheelchair. CSW inquired about DME needs, pt state that she has everything she needs. TOC to follow.   Expected Discharge Plan: Home/Self Care Barriers to Discharge: Continued Medical Work up   Patient Goals and CMS Choice Patient states their goals for this hospitalization and ongoing recovery are:: Return home   Choice offered to / list presented to : NA  Expected Discharge Plan and Services Expected Discharge Plan: Home/Self Care In-house Referral: Clinical Social Work Discharge Planning Services: CM Consult Post Acute Care Choice: [place] arrangements for the past 2 months: Single Family Home                 DME Arranged: N/A DME Agency: NA                  Prior Living Arrangements/Services Living arrangements for the past 2 months: Single Family Home Lives with:: Spouse Patient language and need for interpreter reviewed:: Yes Do you feel safe going back to the place where you live?: Yes      Need for Family Participation in Patient Care: No (Comment) Care giver support system in place?: Yes (comment) Current home services: DME (cane, walker, wheelchair) Criminal Activity/Legal Involvement Pertinent to Current Situation/Hospitalization: No - Comment as needed  Activities of Daily Living      Permission Sought/Granted                  Emotional  Assessment Appearance:: Appears stated age Attitude/Demeanor/Rapport: Engaged Affect (typically observed): Accepting Orientation: : Oriented to Self,Oriented to Place,Oriented to  Time,Oriented to Situation Alcohol / Substance Use: Not Applicable Psych Involvement: No (comment)  Admission diagnosis:  Infected wound [T14.8XXA, L08.9] Patient Active Problem List   Diagnosis Date Noted  . Infected wound 12/14/2020  . Chest pain 10/13/2019  . ACS (acute coronary syndrome) ([place]) 10/13/2019  . Atrial fibrillation ([place]) 10/13/2019  . ASCVD (arteriosclerotic cardiovascular disease)   . Anaphylactic reaction due to adverse effect of correct drug or medicament properly administered, initial encounter 08/12/2019  . Superficial injury of right index finger with infection 12/25/2018  . Lymphedema 04/21/2018  . Chronic venous insufficiency 04/21/2018  . Hyperlipidemia, unspecified 03/03/2018  . Non-STEMI (non-ST elevated myocardial infarction) ([place]) 02/09/2018  . Other abnormalities of gait and mobility 01/04/2017  . Very low level of personal hygiene 01/03/2017  . Other disorders resulting from impaired renal tubular function 10/01/2016  . Hyperparathyroidism ([place]) 06/12/2016  . HTN (hypertension) 06/12/2016  . Nonrheumatic aortic valve stenosis 06/04/2016  . Glomerulonephritis 05/24/2016  . History of asthma 05/24/2016  . History of TIA (transient ischemic attack) 05/24/2016  . Vocal cord dysfunction 05/24/2016  . BMI 40.0-44.9, adult ([place]) 05/24/2016  . Other pruritus 05/21/2016  . Other fluid overload 12/08/2015  . Shortness of breath 07/27/2014  . Mild  protein-calorie malnutrition ([place]) 04/20/2014  . Hypocalcemia 03/27/2014  . ESRD on dialysis ([place]) 03/27/2014  . Hypothyroidism 03/27/2014  . Chronic abdominal wound infection 03/27/2014  . Calciphylaxis 03/27/2014  . End stage renal disease ([place]) 03/27/2014  . Iron deficiency anemia, unspecified 01/16/2014  . Coagulation defect,  unspecified ([place]) 10/28/2013  . Diarrhea, unspecified 10/28/2013  . Other specified symptoms and signs involving the circulatory and respiratory systems 10/28/2013  . Hypertensive chronic kidney disease with stage 5 chronic kidney disease or end stage renal disease ([place]) 12/21/2012  . Cardiovascular disease 09/22/2010  . Atherosclerotic heart disease of native coronary artery without angina pectoris 09/22/2010  . SYNCOPE 07/16/2010  . POSTURAL LIGHTHEADEDNESS 07/16/2010  . OBESITY 07/14/2010  . GASTROESOPHAGEAL REFLUX DISEASE 07/14/2010  . PALPITATIONS 07/14/2010  . HYPERPARATHYROIDISM, HX OF 07/14/2010  . Gastro-esophageal reflux disease without esophagitis 07/14/2010  . Personal history of other endocrine, nutritional and metabolic disease [id number]  . Secondary hyperparathyroidism of renal origin ([place]) 09/26/2009  . 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 Allergy status to other drugs, medicaments and biological substances 03/18/2004  . Patient's noncompliance with other medical treatment and regimen 03/18/2004   PCP:  Patient, No Pcp Per Pharmacy:   [facility], [place] [address] Phone: [phone] Fax: [phone]     Social Determinants of Health (SDOH) Interventions    Readmission Risk Interventions Readmission Risk Prevention Plan 12/14/2020  Medication Screening Complete  Transportation Screening Complete  Some recent data might be hidden

## 2020-12-14 NOTE — ED Provider Notes (Signed)
[facility] [place] EMERGENCY DEPARTMENT Provider Note   CSN: [id number] Arrival date & time: 12/14/20  1350     History Chief Complaint  Patient presents with  . Fall    Lori [name] is a 50 y.o. female presenting for evaluation of head pain, abck pain, bialt shoulder pain after a fall.   Pt states she was leaving dialysis when her foot got caught when transferring from [facility] to the [facility]. She fell backwards, hitting her head. She reports acute onset pain. She did not lose consciousness. She is on ASA and brilinta, has not had them this am. She has not had any pain medicine today. she states pain is constant snd severe. Nothing makes it better or worse. Does not radiate. She denies vision changes, new cp, new abd pain, or new numbness. She is also concerning about a wound on her R foot. It has been present x1 month, since she dropped a can on it. She has seen podiatry- they have referred her to the wound clinic but she has no yet been able to f/u with them. She is cleaning it with peroxide, using neosporin. She is not on antibiotics.   Additional history obtained from chart review, h/o CAD s/p stenting, esrd on dialysis, htn, hypothyroid, obesity, afib on brilinta.   HPI     Past Medical History:  Diagnosis Date  . Anemia   . ASCVD (arteriosclerotic cardiovascular disease)    a. s/p prior stenting of LAD b. NSTEMI in 01/2018 requiring DES x3 to RCA given spiral dissection from ostium to mid-RCA but residual disease along distal RCA, LAD and 2nd Mrg  . Calciphylaxis   . Chronic abdominal wound infection   . Dialysis patient ([place])   . ESRD (end stage renal disease) ([place])    Due to membranous GN dialysis 09/1996; peritoneal dialysis --? peitonitis; difficult vascular access  . GERD (gastroesophageal reflux disease)   . Hashimoto thyroiditis   . Hyperparathyroidism   . Hypertension   . Hypothyroidism   . Medically noncompliant   . Morbid obesity [facility])     Patient Active Problem List    Diagnosis Date Noted  . Chest pain 10/13/2019  . ACS (acute coronary syndrome) ([place]) 10/13/2019  . Atrial fibrillation ([place]) 10/13/2019  . ASCVD (arteriosclerotic cardiovascular disease)   . Anaphylactic reaction due to adverse effect of correct drug or medicament properly administered, initial encounter 08/12/2019  . Superficial injury of right index finger with infection 12/25/2018  . Lymphedema 04/21/2018  . Chronic venous insufficiency 04/21/2018  . Hyperlipidemia, unspecified 03/03/2018  . Non-STEMI (non-ST elevated myocardial infarction) ([place]) 02/09/2018  . Other abnormalities of gait and mobility 01/04/2017  . Very low level of personal hygiene 01/03/2017  . Other disorders resulting from impaired renal tubular function 10/01/2016  . Hyperparathyroidism ([place]) 06/12/2016  . HTN (hypertension) 06/12/2016  . Nonrheumatic aortic valve stenosis 06/04/2016  . Glomerulonephritis 05/24/2016  . History of asthma 05/24/2016  . History of TIA (transient ischemic attack) 05/24/2016  . Vocal cord dysfunction 05/24/2016  . BMI 40.0-44.9, adult ([place]) 05/24/2016  . Other pruritus 05/21/2016  . Other fluid overload 12/08/2015  . Shortness of breath 07/27/2014  . Mild protein-calorie malnutrition ([place]) 04/20/2014  . Hypocalcemia 03/27/2014  . ESRD on dialysis ([place]) 03/27/2014  . Hypothyroidism 03/27/2014  . Chronic abdominal wound infection 03/27/2014  . Calciphylaxis 03/27/2014  . End stage renal disease ([place]) 03/27/2014  . Iron deficiency anemia, unspecified 01/16/2014  . Coagulation defect, unspecified ([place]) 10/28/2013  . Diarrhea,  unspecified 10/28/2013  . Other specified symptoms and signs involving the circulatory and respiratory systems 10/28/2013  . Hypertensive chronic kidney disease with stage 5 chronic kidney disease or end stage renal disease ([place]) 12/21/2012  . Cardiovascular disease 09/22/2010  . Atherosclerotic heart disease of native coronary artery without angina pectoris  09/22/2010  . SYNCOPE 07/16/2010  . POSTURAL LIGHTHEADEDNESS 07/16/2010  . OBESITY 07/14/2010  . GASTROESOPHAGEAL REFLUX DISEASE 07/14/2010  . PALPITATIONS 07/14/2010  . HYPERPARATHYROIDISM, HX OF 07/14/2010  . Gastro-esophageal reflux disease without esophagitis 07/14/2010  . Personal history of other endocrine, nutritional and metabolic disease [id number]  . Secondary hyperparathyroidism of renal origin ([place]) 09/26/2009  . 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 Allergy status to other drugs, medicaments and biological substances 03/18/2004  . Patient's noncompliance with other medical treatment and regimen 03/18/2004    Past Surgical History:  Procedure Laterality Date  . [place]  . CORONARY STENT INTERVENTION N/A 10/21/2019   Procedure: CORONARY STENT INTERVENTION;  Surgeon: Wellington Hampshire, MD;  Location: [place] [facility];  Service: Cardiovascular;  Laterality: N/A;  . LEFT HEART CATH AND CORONARY ANGIOGRAPHY N/A 02/11/2018   Procedure: LEFT HEART CATH AND CORONARY ANGIOGRAPHY;  Surgeon: Charolette Forward, MD;  Location: [place] [facility];  Service: Cardiovascular;  Laterality: N/A;  . LEFT HEART CATH AND CORONARY ANGIOGRAPHY N/A 10/14/2019   Procedure: LEFT HEART CATH AND CORONARY ANGIOGRAPHY;  Surgeon: Wellington Hampshire, MD;  Location: [place] [facility];  Service: Cardiovascular;  Laterality: N/A;  . THYROIDECTOMY, PARTIAL     Resectin of left lobe with reimplantation in the forearm, small focus of papillary carcinoma incidentally noted at pathology - 2000 and Hashimoto's thyrdoiditis in 2001  . TONSILLECTOMY AND ADENOIDECTOMY       OB History    Gravida  1   Para  1   Term      Preterm  1   AB      Living        SAB      IAB      Ectopic      Multiple      Live Births              Family History  Problem Relation Age of Onset  . Coronary artery disease Mother   . Kidney disease Father    . Diabetes Sister     Social History   Tobacco Use  . Smoking status: Former Research scientist (life sciences)  . Smokeless tobacco: Never Used  Substance Use Topics  . Alcohol use: No  . Drug use: No    Home Medications Prior to Admission medications   Medication Sig Start Date End Date Taking? Authorizing Provider  albuterol (PROVENTIL HFA;VENTOLIN HFA) 108 (90 BASE) MCG/ACT inhaler Inhale 2 puffs into the lungs every 6 (six) hours as needed. Shortness of breath   Yes [provider]  aspirin EC 81 MG tablet Take 81 mg by mouth daily. Swallow whole.   Yes [provider]  b complex-vitamin c-folic acid (NEPHRO-VITE) 0.8 MG TABS tablet Take 1 tablet by mouth daily.   Yes [provider]  gabapentin (NEURONTIN) 100 MG capsule Take 100 mg by mouth at bedtime. 11/11/20  Yes [provider]  HYDROcodone-acetaminophen (NORCO/VICODIN) 5-325 MG tablet TAKE (1) TABLET BY MOUTH EVERY (6) HOURS AS NEEDED. 11/29/20  Yes Sanjuana Kava, MD  ibuprofen (ADVIL) 200 MG tablet Take 1,000 mg by mouth every morning. *May take additional  dose in the evening as needed for pain   Yes [provider]  levothyroxine (SYNTHROID, LEVOTHROID) 300 MCG tablet Take 300 mcg by mouth at bedtime. SYNTHROID ONLY-BRAND NAME MEDICALLY NECESSARY   Yes [provider]  Methoxy PEG-Epoetin Beta (MIRCERA IJ) Mircera 08/08/20 08/07/21 Yes [provider]  nitroGLYCERIN (NITROSTAT) 0.4 MG SL tablet Place 1 tablet (0.4 mg total) under the tongue every 5 (five) minutes x 3 doses as needed for chest pain. 02/14/18  Yes Charolette Forward, MD  sevelamer carbonate (RENVELA) 800 MG tablet Take 1,600-3,200 mg by mouth 3 (three) times daily with meals. '3200mg'$  with meals and '1600mg'$  with snacks   Yes [provider]  ticagrelor (BRILINTA) 90 MG TABS tablet Take 1 tablet (90 mg total) by mouth 2 (two) times daily. 02/14/18  Yes Charolette Forward, MD  amiodarone (PACERONE) 200 MG tablet Take 1 tablet (200 mg  total) by mouth daily. Patient not taking: Reported on 12/14/2020 10/18/19 11/17/19  Terrilee Croak, MD  atorvastatin (LIPITOR) 80 MG tablet Take 1 tablet (80 mg total) by mouth daily at 6 PM. Patient not taking: Reported on 12/14/2020 02/14/18   Charolette Forward, MD  carvedilol (COREG) 3.125 MG tablet Take 3.125 mg by mouth 2 (two) times daily with a meal.  Patient not taking: Reported on 12/14/2020 07/28/19   [provider]  EPINEPHrine 0.3 mg/0.3 mL IJ SOAJ injection Inject 0.3 mg into the muscle as needed.    [provider]  losartan (COZAAR) 25 MG tablet Take 0.5 tablets (12.5 mg total) by mouth daily. Patient not taking: Reported on 12/14/2020 02/15/18   Charolette Forward, MD  metoprolol tartrate (LOPRESSOR) 25 MG tablet Take 0.5 tablets (12.5 mg total) by mouth 2 (two) times daily. Patient not taking: Reported on 12/14/2020 10/17/19 11/16/19  Terrilee Croak, MD  sulfamethoxazole-trimethoprim (BACTRIM DS) 800-160 MG tablet Take 1 tablet by mouth 2 (two) times daily. 11/07/20   [provider]    Allergies    Activase [alteplase], Bee pollen, and Warfarin sodium  Review of Systems   Review of Systems  Musculoskeletal: Positive for arthralgias and back pain.  Skin: Positive for wound.  Neurological: Positive for headaches.  Hematological: Bruises/bleeds easily.  All other systems reviewed and are negative.   Physical Exam Updated Vital Signs BP (!) 83/58   Pulse 64   Temp 98.4 F (36.9 C) (Oral)   Resp 18   Wt 100.6 kg   SpO2 96%   BMI 39.29 kg/m   Physical Exam Vitals and nursing note reviewed.  Constitutional:      General: She is not in acute distress.    Appearance: She is well-developed and well-nourished. She is obese.     Comments: Appears chronically ill, not in acute distress today.  HENT:     Head: Normocephalic.     Comments: Hematoma to the occiput. No trauma noted elsewhere Eyes:     Extraocular Movements: Extraocular movements intact and  EOM normal.     Conjunctiva/sclera: Conjunctivae normal.     Pupils: Pupils are equal, round, and reactive to light.  Cardiovascular:     Rate and Rhythm: Normal rate and regular rhythm.     Pulses: Normal pulses and intact distal pulses.  Pulmonary:     Effort: Pulmonary effort is normal. No respiratory distress.     Breath sounds: Normal breath sounds. No wheezing.  Abdominal:     General: There is distension.     Palpations: Abdomen is soft. There is no  mass.     Tenderness: There is no abdominal tenderness. There is no guarding or rebound.     Comments: Mildly distended abd with large surgical scar. baseline  Musculoskeletal:     Cervical back: Normal range of motion and neck supple.     Comments: Chronic wound of the R foot. Poor pedal pulses bilaterally. Erythema of the distal R foot.  Diffuse ttp of the back and bilateral shoulders. No deformity. Pt able to go from laying to sitting without difficulty. No deformity of the hips. Diffuse ttp of the hips bilaterally.   Skin:    Capillary Refill: Capillary refill takes 2 to 3 seconds.  Neurological:     Mental Status: She is alert and oriented to person, place, and time.  Psychiatric:        Mood and Affect: Mood and affect normal.        ED Results / Procedures / Treatments   Labs (all labs ordered are listed, but only abnormal results are displayed) Labs Reviewed  CBC WITH DIFFERENTIAL/PLATELET - Abnormal; Notable for the following components:      Result Value   WBC 20.6 (*)    RBC 3.53 (*)    Hemoglobin 11.4 (*)    HCT 35.1 (*)    RDW 17.0 (*)    Neutro Abs 16.9 (*)    Monocytes Absolute 1.9 (*)    Abs Immature Granulocytes 0.38 (*)    All other components within normal limits  COMPREHENSIVE METABOLIC PANEL - Abnormal; Notable for the following components:   Sodium 132 (*)    Chloride 94 (*)    CO2 21 (*)    BUN 40 (*)    Creatinine, Ser 9.68 (*)    Calcium 8.2 (*)    AST 65 (*)    GFR, Estimated 5 (*)     Anion gap 17 (*)    All other components within normal limits  CULTURE, BLOOD (ROUTINE X 2)  CULTURE, BLOOD (ROUTINE X 2)  RESP PANEL BY RT-PCR (FLU A&B, COVID) ARPGX2  LACTIC ACID, PLASMA  LACTIC ACID, PLASMA    EKG None  Radiology CT ABDOMEN PELVIS WO CONTRAST  Result Date: 12/14/2020 CLINICAL DATA:  Generalized abdominal pain following fall, initial encounter EXAM: CT ABDOMEN AND PELVIS WITHOUT CONTRAST TECHNIQUE: Multidetector CT imaging of the abdomen and pelvis was performed following the standard protocol without IV contrast. COMPARISON:  05/03/2013 FINDINGS: Lower chest: No acute abnormality. Heavy coronary calcifications are noted. Hepatobiliary: Tiny dependent gallstones are noted. The gallbladder is otherwise within normal limits. Liver is unremarkable. Pancreas: Unremarkable. No pancreatic ductal dilatation or surrounding inflammatory changes. Spleen: Normal in size without focal abnormality. Adrenals/Urinary Tract: Adrenal glands are within normal limits. Kidneys demonstrate significant cortical thinning with multiple cysts the largest of which lies in the lower pole of the right kidney measuring 5.8 cm in greatest dimension. This has increased in size from the prior exam at which time it measured 4.6 cm. No obstructive changes are seen. No definitive renal calculi are noted. The bladder is decompressed. These changes are consistent with the patient's known history of end-stage renal failure. Stomach/Bowel: Colon is well visualized without obstructive or inflammatory changes. The appendix is within normal limits. No small bowel or gastric abnormality is noted. Vascular/Lymphatic: Aortic atherosclerosis. No enlarged abdominal or pelvic lymph nodes. Left retroaortic renal vein is noted. Multiple collaterals are noted in the anterior abdominal wall likely related to central venous stenosis. These are stable from the prior exam. Calcified  graft in the left thigh proximally is noted.  Reproductive: Uterus and bilateral adnexa are unremarkable. Other: No abdominal wall hernia or abnormality. No abdominopelvic ascites. Musculoskeletal: No acute or significant osseous findings. IMPRESSION: Cholelithiasis without complicating factors. Changes consistent with end-stage renal failure with multiple renal cysts bilaterally and diffuse cortical thinning. Aortic atherosclerotic changes. Multiple venous collaterals in the anterior abdominal wall consistent with central venous stenosis likely related to the known dialysis. No acute abnormality related to the recent injury is noted. Electronically Signed   By: Inez Catalina M.D.   On: 12/14/2020 17:10   DG Chest 2 View  Result Date: 12/14/2020 CLINICAL DATA:  Pain following fall EXAM: CHEST - 2 VIEW COMPARISON:  October 12, 2019 chest radiograph and chest CT October 16, 2019 FINDINGS: Central catheter tip is in the superior vena cava. No pneumothorax. There is no edema or airspace opacity. Heart is mildly enlarged with pulmonary vascularity normal. No adenopathy. No bone lesions. Postoperative change in the thyroid region noted. IMPRESSION: Central catheter as described without pneumothorax. Lungs clear. Heart mildly enlarged. Electronically Signed   By: Lowella Grip III M.D.   On: 12/14/2020 15:16   CT Head Wo Contrast  Result Date: 12/14/2020 CLINICAL DATA:  Fall.  Hit head. EXAM: CT HEAD WITHOUT CONTRAST CT CERVICAL SPINE WITHOUT CONTRAST TECHNIQUE: Multidetector CT imaging of the head and cervical spine was performed following the standard protocol without intravenous contrast. Multiplanar CT image reconstructions of the cervical spine were also generated. COMPARISON:  CT head dated June 15, 2010. FINDINGS: CT HEAD FINDINGS Brain: No evidence of acute infarction, hemorrhage, hydrocephalus, extra-axial collection or mass lesion/mass effect. Tiny old lacunar infarct in the right basal ganglia. Vascular: Calcified atherosclerosis at the  skullbase. No hyperdense vessel. Skull: Thickened skull with diffuse sclerosis and patchy sclerotic foci, consistent with renal osteodystrophy. No fracture. Sinuses/Orbits: No acute finding. Other: None. CT CERVICAL SPINE FINDINGS Alignment: Mild reversal of the normal cervical lordosis. No traumatic malalignment. Skull base and vertebrae: No acute fracture. No primary bone lesion or focal pathologic process. Soft tissues and spinal canal: No prevertebral fluid or swelling. No visible canal hematoma. Disc levels: Mild disc height loss at C6-C7. Mild right uncovertebral hypertrophy at C2-C3 and C3-C4. Upper chest: Negative. Other: Prior thyroidectomy. IMPRESSION: 1. No acute intracranial abnormality. Old right basal ganglia lacunar infarct. 2. No acute cervical spine fracture or traumatic listhesis. 3. Renal osteodystrophy. Electronically Signed   By: Titus Dubin M.D.   On: 12/14/2020 15:15   CT Cervical Spine Wo Contrast  Result Date: 12/14/2020 CLINICAL DATA:  Fall.  Hit head. EXAM: CT HEAD WITHOUT CONTRAST CT CERVICAL SPINE WITHOUT CONTRAST TECHNIQUE: Multidetector CT imaging of the head and cervical spine was performed following the standard protocol without intravenous contrast. Multiplanar CT image reconstructions of the cervical spine were also generated. COMPARISON:  CT head dated June 15, 2010. FINDINGS: CT HEAD FINDINGS Brain: No evidence of acute infarction, hemorrhage, hydrocephalus, extra-axial collection or mass lesion/mass effect. Tiny old lacunar infarct in the right basal ganglia. Vascular: Calcified atherosclerosis at the skullbase. No hyperdense vessel. Skull: Thickened skull with diffuse sclerosis and patchy sclerotic foci, consistent with renal osteodystrophy. No fracture. Sinuses/Orbits: No acute finding. Other: None. CT CERVICAL SPINE FINDINGS Alignment: Mild reversal of the normal cervical lordosis. No traumatic malalignment. Skull base and vertebrae: No acute fracture. No primary bone  lesion or focal pathologic process. Soft tissues and spinal canal: No prevertebral fluid or swelling. No visible canal hematoma. Disc levels: Mild disc  height loss at C6-C7. Mild right uncovertebral hypertrophy at C2-C3 and C3-C4. Upper chest: Negative. Other: Prior thyroidectomy. IMPRESSION: 1. No acute intracranial abnormality. Old right basal ganglia lacunar infarct. 2. No acute cervical spine fracture or traumatic listhesis. 3. Renal osteodystrophy. Electronically Signed   By: Titus Dubin M.D.   On: 12/14/2020 15:15   DG Foot Complete Left  Result Date: 12/14/2020 CLINICAL DATA:  Wound on foot near fourth and fifth MTP EXAM: LEFT FOOT - COMPLETE 3+ VIEW COMPARISON:  None. FINDINGS: No fracture or malalignment. Vascular calcifications. No soft tissue emphysema or radiopaque foreign body. IMPRESSION: No acute osseous abnormality. Electronically Signed   By: Donavan Foil M.D.   On: 12/14/2020 15:18   DG Hips Bilat W or Wo Pelvis 3-4 Views  Result Date: 12/14/2020 CLINICAL DATA:  Fall, initial encounter. EXAM: DG HIP (WITH OR WITHOUT PELVIS) 3-4V BILAT COMPARISON:  None. FINDINGS: Image quality is degraded by body habitus. No acute osseous or joint abnormality. Vascular stent is seen in the left femoral vasculature. IMPRESSION: Image quality is degraded by body habitus. No definite acute osseous or joint abnormality. Electronically Signed   By: Lorin Picket M.D.   On: 12/14/2020 15:17    Procedures Procedures (including critical care time)  Medications Ordered in ED Medications  methocarbamol (ROBAXIN) tablet 500 mg (has no administration in time range)  sodium chloride 0.9 % bolus 250 mL (has no administration in time range)  midodrine (PROAMATINE) tablet 5 mg (has no administration in time range)  HYDROcodone-acetaminophen (NORCO/VICODIN) 5-325 MG per tablet 1 tablet (1 tablet Oral Given 12/14/20 1512)  piperacillin-tazobactam (ZOSYN) IVPB 3.375 g (0 g Intravenous Stopped 12/14/20 1855)     ED Course  I have reviewed the triage vital signs and the nursing notes.  Pertinent labs & imaging results that were available during my care of the patient were reviewed by me and considered in my medical decision making (see chart for details).    MDM Rules/Calculators/A&P                          Pt presenting for evaluation after a fall. On exam, pt without obvious deformity.  However considering her fall, will obtain CT imaging of the head and neck and x-ray imaging of the chest and pelvis.  Additionally, patient is concerned about a wound on her foot.  It does appear infected, although may be chronic.  She has very poor pedal pulses, this likely will not heal well or quickly.  As patient reports worsening symptoms, we will do an x-ray to rule out osteo.  Will obtain blood work to ensure no infection.  Patient's blood pressure soft, in the 80s.  She reports normally she is in the 90s to 100.  Especially in the setting of soft blood pressures, labs will be important.  Additionally, she reports significant constipation over the past week.  Will obtain CT to ensure no blockage or infection.  CT abdomen pelvis negative for acute findings.  CT head and neck negative for acute findings.  Chest x-ray and pelvis x-ray viewed interpreted by me, no fracture or dislocation. Foot xray without osteo. IV abx ordered.  Delay in patient care due to difficulty obtaining labs.  EJ placed by attending, Dr. Vanita Panda.  Blood pressure remained soft, in the low 80s/upper 70s.  Patient continues to [facility] well.  White count elevated at 20, this is concerning considering infection and saw blood pressure.  IV antibiotics have  already been started.  She will need admission for continued IV antibiotics and monitoring of blood pressure.  As she is a dialysis patient, she cannot receive a large bolus, however will give a 250 cc bolus due to hypotension in the 70s.   Discussed with Dr. Landis Gandy  from triad  hospitalist service, pt to be admitted.   Final Clinical Impression(s) / ED Diagnoses Final diagnoses:  Foot infection  Fall, initial encounter  Hypotension, unspecified hypotension type    Rx / DC Orders ED Discharge Orders    None       Franchot Heidelberg, PA-C 12/14/20 1856    Luna Fuse, MD 12/19/20 [phone]

## 2020-12-15 ENCOUNTER — Inpatient Hospital Stay (HOSPITAL_COMMUNITY): Payer: Medicare Other

## 2020-12-15 DIAGNOSIS — I4891 Unspecified atrial fibrillation: Secondary | ICD-10-CM | POA: Diagnosis not present

## 2020-12-15 DIAGNOSIS — L089 Local infection of the skin and subcutaneous tissue, unspecified: Secondary | ICD-10-CM | POA: Diagnosis not present

## 2020-12-15 DIAGNOSIS — E785 Hyperlipidemia, unspecified: Secondary | ICD-10-CM

## 2020-12-15 DIAGNOSIS — N186 End stage renal disease: Secondary | ICD-10-CM | POA: Diagnosis not present

## 2020-12-15 LAB — BASIC METABOLIC PANEL
Anion gap: 17 — ABNORMAL HIGH (ref 5–15)
BUN: 43 mg/dL — ABNORMAL HIGH (ref 6–20)
CO2: 21 mmol/L — ABNORMAL LOW (ref 22–32)
Calcium: 8 mg/dL — ABNORMAL LOW (ref 8.9–10.3)
Chloride: 92 mmol/L — ABNORMAL LOW (ref 98–111)
Creatinine, Ser: 9.98 mg/dL — ABNORMAL HIGH (ref 0.44–1.00)
GFR, Estimated: 4 mL/min — ABNORMAL LOW (ref >60)
Glucose, Bld: 86 mg/dL (ref 70–99)
Potassium: 4.8 mmol/L (ref 3.5–5.1)
Sodium: 130 mmol/L — ABNORMAL LOW (ref 135–145)

## 2020-12-15 LAB — CBC
HCT: 34.3 % — ABNORMAL LOW (ref 36.0–46.0)
Hemoglobin: 11 g/dL — ABNORMAL LOW (ref 12.0–15.0)
MCH: 31.9 pg (ref 26.0–34.0)
MCHC: 32.1 g/dL (ref 30.0–36.0)
MCV: 99.4 fL (ref 80.0–100.0)
Platelets: 152 10*3/uL (ref 150–400)
RBC: 3.45 MIL/uL — ABNORMAL LOW (ref 3.87–5.11)
RDW: 16.8 % — ABNORMAL HIGH (ref 11.5–15.5)
WBC: 17.2 10*3/uL — ABNORMAL HIGH (ref 4.0–10.5)
nRBC: 0 % (ref 0.0–0.2)

## 2020-12-15 LAB — HIV ANTIBODY (ROUTINE TESTING W REFLEX): HIV Screen 4th Generation wRfx: NONREACTIVE

## 2020-12-15 MED ORDER — LACTATED RINGERS IV BOLUS
250.0000 mL | Freq: Once | INTRAVENOUS | Status: AC
  Administered 2020-12-15: 250 mL via INTRAVENOUS

## 2020-12-15 MED ORDER — NYSTATIN 100000 UNIT/GM EX POWD
Freq: Two times a day (BID) | CUTANEOUS | Status: DC
  Filled 2020-12-15 (×4): qty 15

## 2020-12-15 MED ORDER — MIDODRINE HCL 5 MG PO TABS
10.0000 mg | ORAL_TABLET | Freq: Three times a day (TID) | ORAL | Status: DC
  Administered 2020-12-15 – 2020-12-20 (×16): 10 mg via ORAL
  Filled 2020-12-15 (×17): qty 2

## 2020-12-15 MED ORDER — COLLAGENASE 250 UNIT/GM EX OINT
TOPICAL_OINTMENT | Freq: Every day | CUTANEOUS | Status: DC
  Administered 2020-12-16: 1 via TOPICAL
  Filled 2020-12-15: qty 30

## 2020-12-15 MED ORDER — HYDROCODONE-ACETAMINOPHEN 5-325 MG PO TABS
1.0000 | ORAL_TABLET | ORAL | Status: DC | PRN
  Administered 2020-12-15 – 2020-12-16 (×3): 1 via ORAL
  Filled 2020-12-15 (×4): qty 1

## 2020-12-15 MED ORDER — CHLORHEXIDINE GLUCONATE CLOTH 2 % EX PADS
6.0000 | MEDICATED_PAD | Freq: Every day | CUTANEOUS | Status: DC
  Administered 2020-12-15 – 2020-12-19 (×5): 6 via TOPICAL

## 2020-12-15 MED ORDER — SODIUM CHLORIDE 0.9 % IV SOLN
100.0000 mg | Freq: Every day | INTRAVENOUS | Status: AC
  Administered 2020-12-16 – 2020-12-17 (×2): 100 mg via INTRAVENOUS
  Filled 2020-12-15 (×2): qty 20

## 2020-12-15 MED ORDER — FENTANYL CITRATE (PF) 100 MCG/2ML IJ SOLN: 12.5000 ug | Freq: Once | INTRAMUSCULAR | Status: DC | PRN

## 2020-12-15 NOTE — Consult Note (Signed)
WOC Nurse Consult Note: Patient receiving care in [facility] ED3. Reason for Consult: LE wound Dr. Mickeal Needy has entered orders for the foot wound. WOC did not see and will not follow. Val Riles, RN, MSN, CWOCN, CNS-BC, pager [phone]

## 2020-12-15 NOTE — Progress Notes (Addendum)
Initial Nutrition Assessment  DOCUMENTATION CODES:   Morbid obesity  INTERVENTION:  ProSource Plus-TID   Recommend consider liberalized diet if feasible   NUTRITION DIAGNOSIS:   Increased nutrient needs related to wound healing as evidenced by estimated needs.   GOAL:  Provide needs based on ASPEN/SCCM guidelines  MONITOR:  PO intake,Skin,Weight trends,I & O's,Labs,Supplement acceptance   REASON FOR ASSESSMENT:   Consult Wound healing  ASSESSMENT: Patient is an obese 50 yo female with ESRD-HD, Anemia, CAD, HTN, GERD and hyperparathyroidism. Mechanical fall, weakness, shortness of breath and left foot wound. She is COVID positive and hypotensive on admission.  Meal intake unknown at this time - pt just arriving to ICU. Discussed with her nurse. Patient able to take pills without difficulty. Will follow intake and assess adequacy.   Medications reviewed and include: renvela, protonix, maxipime, flagyl, remdesivir.  Labs reviewed: sodium 130 (L), BUN-43(H), Cr 9.98(H)  I/O - +100 ml.   Weight history review- gain of ~8 kg (8%) x 4 months.   Diet Order:   Diet Order            Diet renal with fluid restriction Fluid restriction: 1200 mL Fluid; Room service appropriate? Yes; Fluid consistency: Thin  Diet effective now                 EDUCATION NEEDS:  Not appropriate for education at this time   Skin:  Skin Assessment: Skin Integrity Issues: Skin Integrity Issues::  (venous stasis ulcers to left and right foot)  Last BM:  unknown  Height:   Ht Readings from Last 1 Encounters:  12/15/20 '5\' 3"'$  (1.6 m)    Weight:   Wt Readings from Last 1 Encounters:  12/15/20 109.5 kg    Ideal Body Weight:   52 kg Adj BW-73.8 kg  BMI:  Body mass index is 42.76 kg/m.  Estimated Nutritional Needs:   Kcal:  [id number]  Protein:  96-103 gr  Fluid:  1000 ml plus urine output   Colman Cater MS,RD,CSG,LDN Pager: Shea Evans

## 2020-12-15 NOTE — Plan of Care (Signed)
  Problem: Acute Rehab PT Goals(only PT should resolve) Goal: Pt Will Go Supine/Side To Sit Outcome: Progressing Flowsheets (Taken 12/15/2020 1409) Pt will go Supine/Side to Sit:  with min guard assist  with minimal assist Goal: Patient Will Transfer Sit To/From Stand Outcome: Progressing Flowsheets (Taken 12/15/2020 1409) Patient will transfer sit to/from stand:  with minimal assist  with moderate assist Goal: Pt Will Transfer Bed To Chair/Chair To Bed Outcome: Progressing Flowsheets (Taken 12/15/2020 1409) Pt will Transfer Bed to Chair/Chair to Bed:  with min assist  with mod assist Goal: Pt Will Ambulate Outcome: Progressing Flowsheets (Taken 12/15/2020 1409) Pt will Ambulate:  25 feet  with minimal assist  with moderate assist  with rolling walker  with cane   2:10 PM, 12/15/20 Lonell Grandchild, MPT Physical Therapist with [facility] [phone] office [phone] mobile phone

## 2020-12-15 NOTE — Progress Notes (Addendum)
PROGRESS NOTE   Lori [name]  [id number] DOB: 18-May-1971 DOA: 12/14/2020 PCP: Patient, No Pcp Per   Chief Complaint  Patient presents with  . Fall    Brief Admission History:   50 y.o. female, past medical history of CAD status post stent to LAD and 2020, ESRD on hemodialysis Monday Wednesday Friday, followed by Dr. Raquel Sarna, hypertension, hypothyroidism, hyperparathyroidism, GERD. -Patient presents to ED status post fall, head pain, back pain and generalized weakness, patient reported mechanical fall as she was getting out of the [facility] for her hemodialysis, she does report a fall with head trauma, denies any syncope or near syncope, for very generalized weak, fatigue, reports she did not finish her dialysis due to weakness, and she was sent to ED for further evaluation, any fever, chills, cough, shortness of breath, patient reports left foot wound for last month, she has been following by podiatry in [place], she finished antibiotic course last week, report wound is not improving, as well reports she has been out of her cardiac medications for last few weeks, and could not renew as she did not see her cardiologist (Dr. Terrence Dupont).  Patient denies any respiratory symptoms, no cough, no shortness of breath, reports she tested negative for COVID last week.  Her screening COVID-19 test came back positive in ED  -Presentation to ED, blood pressure was soft, white blood cell elevated at 20 K, but she had normal lactic acid, x-ray of infected foot with no evidence of osteomyelitis, hypotensive responded to fluid bolus, [facility] hospitalist consulted to admit.  Assessment & Plan:   Active Problems:   ESRD on dialysis ([place])   BMI 40.0-44.9, adult (HCC)   Atrial fibrillation (HCC)   Hyperlipidemia, unspecified   Infected wound   1. Sepsis ruled out.  Left wound infection - Unfortunately from the vascular studides done she appears to have an ischemic left lower extremity with very poor vascular flow  left foot.  I discussed with Dr. Arnoldo Morale. Continue broad spectrum antibiotics.  No evidence of osteomyelitis.  Pt has had a fem pop bypass done on the left side that is no longer functioning.   2. Covid Infection - incidental finding - given high risk status started on remdesivir.  3. CAD - continue aspirin and brilinta.  4. Chronic atrial fibrillation - Pt says she has not been taking her amiodarone.  HR controlled. 5. Hypotension - has not been persistently low but has intermittently been low, she is on midodrine.  Continue to follow.  6. Hypothyroidism - continue levothyroxine.     DVT prophylaxis: heparin Code Status: full  Family Communication: call to daughter was rejected x 2 Disposition: home with [facility]  Status is: Inpatient  Remains inpatient appropriate because:IV treatments appropriate due to intensity of illness or inability to take PO and Inpatient level of care appropriate due to severity of illness   Dispo: The patient is from: Home              Anticipated d/c is to: Home              Anticipated d/c date is: 1 day              Patient currently is not medically stable to d/c.   Consultants:   Surgery  Nephrology    Procedures:     Antimicrobials:     Subjective: Pt reports feeling tired.  No chest pain and no SOB.   Objective: Vitals:   12/15/20 1216 12/15/20 1244  12/15/20 1300 12/15/20 1400  BP: (!) 87/57 (!) 96/46 (!) 135/46 (!) 150/74  Pulse:  (!) 54 (!) 54 (!) 45  Resp:  '14 14 13  '$ Temp:  97.8 F (36.6 C)    TempSrc:  Oral    SpO2:  100% 100% 100%  Weight:  109.5 kg    Height:  '5\' 3"'$  (1.6 m)      Intake/Output Summary (Last 24 hours) at 12/15/2020 1640 Last data filed at 12/15/2020 0935 Gross per 24 hour  Intake 100 ml  Output --  Net 100 ml   Filed Weights   12/14/20 1402 12/15/20 1244  Weight: 100.6 kg 109.5 kg    Examination:  General exam: Appears calm and comfortable  Respiratory system: Clear to auscultation. Respiratory  effort normal. Cardiovascular system: normal S1 & S2 heard. No JVD, murmurs, rubs, gallops or clicks. No pedal edema. Gastrointestinal system: Abdomen is nondistended, soft and nontender. No organomegaly or masses felt. Normal bowel sounds heard. Central nervous system: Alert and oriented. No focal neurological deficits. Extremities: Symmetric 5 x 5 power. Skin: No rashes, lesions or ulcers Psychiatry: Judgement and insight appear normal. Mood & affect appropriate.   Data Reviewed: I have personally reviewed following labs and imaging studies  CBC: Recent Labs  Lab 12/14/20 1715 12/15/20 0613  WBC 20.6* 17.2*  NEUTROABS 16.9*  --   HGB 11.4* 11.0*  HCT 35.1* 34.3*  MCV 99.4 99.4  PLT 167 0000000    Basic Metabolic Panel: Recent Labs  Lab 12/14/20 1715 12/15/20 0613  NA 132* 130*  K 5.1 4.8  CL 94* 92*  CO2 21* 21*  GLUCOSE 80 86  BUN 40* 43*  CREATININE 9.68* 9.98*  CALCIUM 8.2* 8.0*    GFR: Estimated Creatinine Clearance: 7.9 mL/min (A) (by C-G formula based on SCr of 9.98 mg/dL (H)).  Liver Function Tests: Recent Labs  Lab 12/14/20 1715  AST 65*  ALT 27  ALKPHOS 50  BILITOT 0.6  PROT 7.1  ALBUMIN 3.5    CBG: No results for input(s): GLUCAP in the last 168 hours.  Recent Results (from the past 240 hour(s))  Blood culture (routine x 2)     Status: None (Preliminary result)   Collection Time: 12/14/20  2:18 PM   Specimen: Right Antecubital; Blood  Result Value Ref Range Status   Specimen Description RIGHT ANTECUBITAL  Final   Special Requests   Final    BOTTLES DRAWN AEROBIC AND ANAEROBIC Blood Culture results may not be optimal due to an inadequate volume of blood received in culture bottles   Culture   Final    NO GROWTH < 24 HOURS Performed at [facility], [address] [place], [place] [address]    Report Status PENDING  Incomplete  Resp Panel by RT-PCR (Flu A&B, Covid) Nasopharyngeal Swab     Status: Abnormal   Collection Time: 12/14/20  7:21 PM    Specimen: Nasopharyngeal Swab; Nasopharyngeal(NP) swabs in vial transport medium  Result Value Ref Range Status   SARS Coronavirus 2 by RT PCR POSITIVE (A) NEGATIVE Final    Comment: RESULT CALLED TO, READ BACK BY AND VERIFIED WITH: HARRY,A @ 2016 ON 12/14/20 BY JUW (NOTE) SARS-CoV-2 target nucleic acids are DETECTED.  The SARS-CoV-2 RNA is generally detectable in upper respiratory specimens during the acute phase of infection. Positive results are indicative of the presence of the identified virus, but do not rule out bacterial infection or co-infection with other pathogens not detected by the test.  Clinical correlation with patient history and other diagnostic information is necessary to determine patient infection status. The expected result is Negative.  Fact Sheet for Patients: EntrepreneurPulse.com.au  Fact Sheet for Healthcare Providers: IncredibleEmployment.be  This test is not yet approved or cleared by the [place] FDA and  has been authorized for detection and/or diagnosis of SARS-CoV-2 by FDA under an Emergency Use Authorization (EUA).  This EUA will remain in effect (meaning this test can be  used) for the duration of  the COVID-19 declaration under Section 564(b)(1) of the Act, 21 U.S.C. section 360bbb-3(b)(1), unless the authorization is terminated or revoked sooner.     Influenza A by PCR NEGATIVE NEGATIVE Final   Influenza B by PCR NEGATIVE NEGATIVE Final    Comment: (NOTE) The Xpert Xpress SARS-CoV-2/FLU/RSV plus assay is intended as an aid in the diagnosis of influenza from Nasopharyngeal swab specimens and should not be used as a sole basis for treatment. Nasal washings and aspirates are unacceptable for Xpert Xpress SARS-CoV-2/FLU/RSV testing.  Fact Sheet for Patients: EntrepreneurPulse.com.au  Fact Sheet for Healthcare Providers: IncredibleEmployment.be  This test is not  yet approved or cleared by the [place] FDA and has been authorized for detection and/or diagnosis of SARS-CoV-2 by FDA under an Emergency Use Authorization (EUA). This EUA will remain in effect (meaning this test can be used) for the duration of the COVID-19 declaration under Section 564(b)(1) of the Act, 21 U.S.C. section 360bbb-3(b)(1), unless the authorization is terminated or revoked.  Performed at [facility], [address] [place], [place] [address]   Blood culture (routine x 2)     Status: None (Preliminary result)   Collection Time: 12/14/20  9:25 PM   Specimen: Neck; Blood  Result Value Ref Range Status   Specimen Description NECK  Final   Special Requests   Final    BOTTLES DRAWN AEROBIC AND ANAEROBIC Blood Culture adequate volume   Culture   Final    NO GROWTH < 24 HOURS Performed at [facility], [address], [place], [place] [address]    Report Status PENDING  Incomplete     Radiology Studies: CT ABDOMEN PELVIS WO CONTRAST  Result Date: 12/14/2020 CLINICAL DATA:  Generalized abdominal pain following fall, initial encounter EXAM: CT ABDOMEN AND PELVIS WITHOUT CONTRAST TECHNIQUE: Multidetector CT imaging of the abdomen and pelvis was performed following the standard protocol without IV contrast. COMPARISON:  05/03/2013 FINDINGS: Lower chest: No acute abnormality. Heavy coronary calcifications are noted. Hepatobiliary: Tiny dependent gallstones are noted. The gallbladder is otherwise within normal limits. Liver is unremarkable. Pancreas: Unremarkable. No pancreatic ductal dilatation or surrounding inflammatory changes. Spleen: Normal in size without focal abnormality. Adrenals/Urinary Tract: Adrenal glands are within normal limits. Kidneys demonstrate significant cortical thinning with multiple cysts the largest of which lies in the lower pole of the right kidney measuring 5.8 cm in greatest dimension. This has increased in size from the prior exam at which time it  measured 4.6 cm. No obstructive changes are seen. No definitive renal calculi are noted. The bladder is decompressed. These changes are consistent with the patient's known history of end-stage renal failure. Stomach/Bowel: Colon is well visualized without obstructive or inflammatory changes. The appendix is within normal limits. No small bowel or gastric abnormality is noted. Vascular/Lymphatic: Aortic atherosclerosis. No enlarged abdominal or pelvic lymph nodes. Left retroaortic renal vein is noted. Multiple collaterals are noted in the anterior abdominal wall likely related to central venous stenosis. These are stable from the prior exam.  Calcified graft in the left thigh proximally is noted. Reproductive: Uterus and bilateral adnexa are unremarkable. Other: No abdominal wall hernia or abnormality. No abdominopelvic ascites. Musculoskeletal: No acute or significant osseous findings. IMPRESSION: Cholelithiasis without complicating factors. Changes consistent with end-stage renal failure with multiple renal cysts bilaterally and diffuse cortical thinning. Aortic atherosclerotic changes. Multiple venous collaterals in the anterior abdominal wall consistent with central venous stenosis likely related to the known dialysis. No acute abnormality related to the recent injury is noted. Electronically Signed   By: Inez Catalina M.D.   On: 12/14/2020 17:10   DG Chest 2 View  Result Date: 12/14/2020 CLINICAL DATA:  Pain following fall EXAM: CHEST - 2 VIEW COMPARISON:  October 12, 2019 chest radiograph and chest CT October 16, 2019 FINDINGS: Central catheter tip is in the superior vena cava. No pneumothorax. There is no edema or airspace opacity. Heart is mildly enlarged with pulmonary vascularity normal. No adenopathy. No bone lesions. Postoperative change in the thyroid region noted. IMPRESSION: Central catheter as described without pneumothorax. Lungs clear. Heart mildly enlarged. Electronically Signed   By: Lowella Grip III M.D.   On: 12/14/2020 15:16   CT Head Wo Contrast  Result Date: 12/14/2020 CLINICAL DATA:  Fall.  Hit head. EXAM: CT HEAD WITHOUT CONTRAST CT CERVICAL SPINE WITHOUT CONTRAST TECHNIQUE: Multidetector CT imaging of the head and cervical spine was performed following the standard protocol without intravenous contrast. Multiplanar CT image reconstructions of the cervical spine were also generated. COMPARISON:  CT head dated June 15, 2010. FINDINGS: CT HEAD FINDINGS Brain: No evidence of acute infarction, hemorrhage, hydrocephalus, extra-axial collection or mass lesion/mass effect. Tiny old lacunar infarct in the right basal ganglia. Vascular: Calcified atherosclerosis at the skullbase. No hyperdense vessel. Skull: Thickened skull with diffuse sclerosis and patchy sclerotic foci, consistent with renal osteodystrophy. No fracture. Sinuses/Orbits: No acute finding. Other: None. CT CERVICAL SPINE FINDINGS Alignment: Mild reversal of the normal cervical lordosis. No traumatic malalignment. Skull base and vertebrae: No acute fracture. No primary bone lesion or focal pathologic process. Soft tissues and spinal canal: No prevertebral fluid or swelling. No visible canal hematoma. Disc levels: Mild disc height loss at C6-C7. Mild right uncovertebral hypertrophy at C2-C3 and C3-C4. Upper chest: Negative. Other: Prior thyroidectomy. IMPRESSION: 1. No acute intracranial abnormality. Old right basal ganglia lacunar infarct. 2. No acute cervical spine fracture or traumatic listhesis. 3. Renal osteodystrophy. Electronically Signed   By: Titus Dubin M.D.   On: 12/14/2020 15:15   CT Cervical Spine Wo Contrast  Result Date: 12/14/2020 CLINICAL DATA:  Fall.  Hit head. EXAM: CT HEAD WITHOUT CONTRAST CT CERVICAL SPINE WITHOUT CONTRAST TECHNIQUE: Multidetector CT imaging of the head and cervical spine was performed following the standard protocol without intravenous contrast. Multiplanar CT image reconstructions of  the cervical spine were also generated. COMPARISON:  CT head dated June 15, 2010. FINDINGS: CT HEAD FINDINGS Brain: No evidence of acute infarction, hemorrhage, hydrocephalus, extra-axial collection or mass lesion/mass effect. Tiny old lacunar infarct in the right basal ganglia. Vascular: Calcified atherosclerosis at the skullbase. No hyperdense vessel. Skull: Thickened skull with diffuse sclerosis and patchy sclerotic foci, consistent with renal osteodystrophy. No fracture. Sinuses/Orbits: No acute finding. Other: None. CT CERVICAL SPINE FINDINGS Alignment: Mild reversal of the normal cervical lordosis. No traumatic malalignment. Skull base and vertebrae: No acute fracture. No primary bone lesion or focal pathologic process. Soft tissues and spinal canal: No prevertebral fluid or swelling. No visible canal hematoma. Disc levels: Mild disc  height loss at C6-C7. Mild right uncovertebral hypertrophy at C2-C3 and C3-C4. Upper chest: Negative. Other: Prior thyroidectomy. IMPRESSION: 1. No acute intracranial abnormality. Old right basal ganglia lacunar infarct. 2. No acute cervical spine fracture or traumatic listhesis. 3. Renal osteodystrophy. Electronically Signed   By: Titus Dubin M.D.   On: 12/14/2020 15:15   US ARTERIAL ABI (SCREENING LOWER EXTREMITY)  Result Date: 12/15/2020 CLINICAL DATA:  50 year old female with a history cellulitis of the fourth EXAM: NONINVASIVE PHYSIOLOGIC VASCULAR STUDY OF BILATERAL LOWER EXTREMITIES TECHNIQUE: Evaluation of both lower extremities was performed at rest, including calculation of ankle-brachial indices, multiple segmental pressure evaluation, segmental Doppler and segmental pulse volume recording. COMPARISON:  None. FINDINGS: Right ABI:  Not acquired Left ABI:  Not acquired Right Lower Extremity: Segmental Doppler at the right ankle demonstrates no signal of the posterior tibial artery and monophasic dorsalis pedis. Left Lower Extremity: No signal identified within the  posterior tibial artery or dorsalis pedis on the left. IMPRESSION: Resting ABI the bilateral lower extremities was not performed. Monophasic waveform of the dorsalis pedis on the right with no signal of the posterior tibial artery. On the left no signal was identified within the tibial arteries at the ankle. Signed, Dulcy Fanny. Dellia Nims, RPVI [facility] Electronically Signed   By: Corrie Mckusick D.O.   On: 12/15/2020 11:35   US ARTERIAL LOWER EXTREMITY DUPLEX LEFT (NON-ABI)  Result Date: 12/15/2020 CLINICAL DATA:  50 year old female with a left foot wound EXAM: NONINVASIVE PHYSIOLOGIC VASCULAR STUDY OF UNILATERAL LOWER EXTREMITIES TECHNIQUE: Evaluation of left lower extremities was performed at rest, including directed duplex. COMPARISON:  None. FINDINGS: Left ABI: Not acquired Left Lower Extremity: Directed duplex left lower extremity demonstrates monophasic waveform of the common femoral artery, superficial femoral artery, popliteal artery. Monophasic anterior tibial artery proximally. No signal distally. No signal of the distal peroneal artery or posterior tibial artery. IMPRESSION: Directed duplex demonstrates monophasic waveform of the common femoral artery, profunda femoris, SFA, popliteal artery. There is no signal identified within the distal anterior tibial artery, posterior tibial artery, peroneal artery. Signed, Dulcy Fanny. Dellia Nims, RPVI [facility] Electronically Signed   By: Corrie Mckusick D.O.   On: 12/15/2020 15:59   DG Foot Complete Left  Result Date: 12/14/2020 CLINICAL DATA:  Wound on foot near fourth and fifth MTP EXAM: LEFT FOOT - COMPLETE 3+ VIEW COMPARISON:  None. FINDINGS: No fracture or malalignment. Vascular calcifications. No soft tissue emphysema or radiopaque foreign body. IMPRESSION: No acute osseous abnormality. Electronically Signed   By: Donavan Foil M.D.   On:  12/14/2020 15:18   DG Hips Bilat W or Wo Pelvis 3-4 Views  Result Date: 12/14/2020 CLINICAL DATA:  Fall, initial encounter. EXAM: DG HIP (WITH OR WITHOUT PELVIS) 3-4V BILAT COMPARISON:  None. FINDINGS: Image quality is degraded by body habitus. No acute osseous or joint abnormality. Vascular stent is seen in the left femoral vasculature. IMPRESSION: Image quality is degraded by body habitus. No definite acute osseous or joint abnormality. Electronically Signed   By: Lorin Picket M.D.   On: 12/14/2020 15:17   Scheduled Meds: . aspirin EC  81 mg Oral Daily  . Chlorhexidine Gluconate Cloth  6 each Topical Daily  . collagenase   Topical Daily  . heparin  5,000 Units Subcutaneous Q8H  . levothyroxine  300 mcg Oral QHS  . midodrine  10 mg Oral TID WC  . nystatin   Topical BID  .  pantoprazole  40 mg Oral QHS  . sevelamer carbonate  3,200 mg Oral TID WC  . ticagrelor  90 mg Oral BID   Continuous Infusions: . [START ON 12/16/2020] ceFEPime (MAXIPIME) IV    . metronidazole 500 mg (12/15/20 1417)  . [START ON 12/16/2020] remdesivir 100 mg in NS 100 mL    . [START ON 12/16/2020] vancomycin       LOS: 1 day   Time spent: 37 mins   Yair Dusza Wynetta Emery, MD How to contact the [facility] Attending or Consulting provider [place] or covering provider during after hours [place], for this patient?  1. Check the care team in [facility] and look for a) attending/consulting [facility] provider listed and b) the [facility] team listed 2. Log into [email or web address] and use [facility]'s universal password to access. If you do not have the password, please contact the hospital operator. 3. Locate the [facility] provider you are looking for under [facility] and page to a number that you can be directly reached. 4. If you still have difficulty reaching the provider, please page the [facility] (Director on Call) for the Hospitalists listed on amion for assistance.  12/15/2020, 4:40 PM

## 2020-12-15 NOTE — Evaluation (Signed)
Physical Therapy Evaluation Patient Details Name: Lori [name] MRN: [id number] DOB: 12-22-1970 Today's Date: 12/15/2020   History of Present Illness  Lori [name]  is a 50 y.o. female, past medical history of CAD status post stent to LAD and 2020, ESRD on hemodialysis Monday Wednesday Friday, followed by Dr. Raquel Sarna, hypertension, hypothyroidism, hyperparathyroidism, GERD.  -Patient presents to ED status post fall, head pain, back pain and generalized weakness, patient reported mechanical fall as she was getting out of the [place] for her hemodialysis, she does report a fall with head trauma, denies any syncope or near syncope, for very generalized weak, fatigue, reports she did not finish her dialysis due to weakness, and she was sent to ED for further evaluation, any fever, chills, cough, shortness of breath, patient reports left foot wound for last month, she has been following by podiatry in [place], she finished antibiotic course last week, report wound is not improving, as well reports she has been out of her cardiac medications for last few weeks, and could not renew as she did not see her cardiologist (Dr. Terrence Dupont).  Patient denies any respiratory symptoms, no cough, no shortness of breath, reports she tested negative for COVID last week.  Her screening COVID-19 test came back positive in ED   -Presentation to ED, blood pressure was soft, white blood cell elevated at 20 K, but she had normal lactic acid, x-ray of infected foot with no evidence of osteomyelitis, hypotensive responded to fluid bolus, [facility] hospitalist consulted to admit.    Clinical Impression  Patient demonstrates slow labored movement for sitting up at bedside with c/o severe pain in groin due to yeast infection per patient and left foot.  Patient unable to attempt sit to stands or transferring to chair due to c/o fatigue and pain.  Patient required Min assist to reposition self when put back to bed.  Patient will benefit from continued  physical therapy in hospital and recommended venue below to increase strength, balance, endurance for safe ADLs and gait.     Follow Up Recommendations SNF    Equipment Recommendations  None recommended by PT    Recommendations for Other Services       Precautions / Restrictions Precautions Precautions: Fall Precaution Comments: wound left foot Restrictions Weight Bearing Restrictions: No      Mobility  Bed Mobility Overal bed mobility: Needs Assistance Bed Mobility: Supine to Sit;Sit to Supine     Supine to sit: Mod assist Sit to supine: Min assist   General bed mobility comments: increased time, labored movement, severe pain left foot & groin area    Transfers                    Ambulation/Gait                Stairs            Wheelchair Mobility    Modified Rankin (Stroke Patients Only)       Balance Overall balance assessment: Needs assistance Sitting-balance support: Feet supported;No upper extremity supported Sitting balance-Leahy Scale: Fair Sitting balance - Comments: seated at EOB                                     Pertinent Vitals/Pain Pain Assessment: Faces Faces Pain Scale: Hurts even more Pain Location: left foot and groin area Pain Descriptors / Indicators: Aching;Sore;Grimacing Pain Intervention(s): Limited activity within  patient's tolerance;Monitored during session    Home Living Family/patient expects to be discharged to:: Private residence Living Arrangements: Spouse/significant other Available Help at Discharge: Family Type of Home: House Home Access: Stairs to enter Entrance Stairs-Rails: Right Entrance Stairs-Number of Steps: 2 Home Layout: One [place]: [place] - 2 wheels;Bedside commode;Wheelchair - manual      Prior Function Level of Independence: Independent with assistive device(s)         Comments: household and short distanced [facility]      Hand Dominance        Extremity/Trunk Assessment   Upper Extremity Assessment Upper Extremity Assessment: Generalized weakness    Lower Extremity Assessment Lower Extremity Assessment: Generalized weakness;LLE deficits/detail LLE Deficits / Details: grossly -3/5 LLE: Unable to fully assess due to pain LLE Sensation: WNL LLE Coordination: WNL    Cervical / Trunk Assessment Cervical / Trunk Assessment: Kyphotic  Communication   Communication: No difficulties  Cognition Arousal/Alertness: Awake/alert Behavior During Therapy: WFL for tasks assessed/performed Overall Cognitive Status: Within Functional Limits for tasks assessed                                        General Comments      Exercises     Assessment/Plan    PT Assessment Patient needs continued PT services  PT Problem List Decreased strength;Decreased activity tolerance;Decreased balance;Decreased mobility       PT Treatment Interventions Balance training;DME instruction;Gait training;Stair training;Functional mobility training;Therapeutic activities;Therapeutic exercise;Patient/family education    PT Goals (Current goals can be found in the Care Plan section)  Acute Rehab PT Goals Patient Stated Goal: return home with family to assist PT Goal Formulation: With patient Time For Goal Achievement: 12/29/20 Potential to Achieve Goals: Good    Frequency Min 3X/week   Barriers to discharge        Co-evaluation               AM-PAC PT "6 Clicks" Mobility  Outcome Measure Help needed turning from your back to your side while in a flat bed without using bedrails?: A Little Help needed moving from lying on your back to sitting on the side of a flat bed without using bedrails?: A Lot Help needed moving to and from a bed to a chair (including a wheelchair)?: A Lot Help needed standing up from a chair using your arms (e.g., wheelchair or bedside chair)?: A Lot Help needed to  walk in hospital room?: Total Help needed climbing 3-5 steps with a railing? : Total 6 Click Score: 11    End of Session Equipment Utilized During Treatment: Oxygen Activity Tolerance: Patient tolerated treatment well;Patient limited by fatigue;Patient limited by pain Patient left: in bed;with call bell/phone within reach Nurse Communication: Mobility status PT Visit Diagnosis: Unsteadiness on feet (R26.81);Other abnormalities of gait and mobility (R26.89);Muscle weakness (generalized) (M62.81)    Time: 1135-1200 PT Time Calculation (min) (ACUTE ONLY): 25 min   Charges:   PT Evaluation $PT Eval Moderate Complexity: 1 Mod PT Treatments $Therapeutic Activity: 23-37 mins        2:08 PM, 12/15/20 Lonell Grandchild, MPT Physical Therapist with [facility] [phone] office [phone] mobile phone

## 2020-12-15 NOTE — ED Notes (Signed)
Pt BP 84/68 retaken after 75/62. Dr. Olevia Bowens aware, orders for 236m fluid bolus over 2 hours. Will continue to monitor.

## 2020-12-15 NOTE — Progress Notes (Signed)
Patient requesting pain medication for foot. Vicodin obtained per orders, however, patient dropped medicine cup that contained vicodin while trying to take the medication. RN unable to find pill in patient's bed or on the floor after thorough search of surrounding location. Medication cup did not reach patient's mouth prior to spilling. RN obtained another vicodin to medicate patient with and assisted patient with placing medication in mouth.

## 2020-12-15 NOTE — ED Notes (Addendum)
Spoke with Dr. Abundio Miu. Pt BP 70/53 at this time, she has just completed 128m normal saline IV (760man hour x 2 hours). Administer Midrodrine at this time and bolus of 25039molus IVF. Pt complains of pain not being relieved, no further pain medicine at this time, will continue to monitor and re-evaluate. Hold 2315 vancomycin, pt is already receiving '1500mg'$  at this time. Verbal orders of Dr. OrtOlevia Bowens

## 2020-12-15 NOTE — Progress Notes (Addendum)
[facility] night shift.  The staff reports that the patient is hypotensive with a BP of 70/53 mmHg.  Her heart rate is 71, respiratory rate is 15 and she is satting 100% on room air.  She did receive 2 tablets of Norco 5/325 about 2 hours before the measurement.  She did not get midodrine earlier since instructions were not to give after daytime.  A 250 mL LR bolus was ordered.  I have instructed the staff to give me the drain now.  If this is not effective to bring MAP over 65 mmHg, I will consider more fluids or pressor infusion.  I have also changed hydrocodone to 1 tablet every 4 hours as needed.  Tennis Must, MD

## 2020-12-15 NOTE — Consult Note (Signed)
Reason for Consult: To manage dialysis and dialysis related needs Referring Physician: Pennee Mcilwain is an 50 y.o. female with past medical history significant for hypertension, coronary artery disease, hypothyroidism and ESRD-HD at [facility] Monday Wednesday Friday.  Patient presented to the emergency department late Wednesday.  She reported a fall while getting out of the [facility] [place] for her hemodialysis where she struck her head.  She did complete over 2 hours up to her dialysis but then had to stop secondary to weakness and brought to the ER.  They discovered she had been out of her cardiac medications for a while.  They tested her for COVID which turned out positive, white blood count 20,000.  She has a chronic foot wound that is present.  Throughout her course in the emergency department she has been hypotensive with a lactic acid of 2 so plans are to admit.  We are asked to provide dialysis support.  She dialyzes via a TDC, blood cultures have been obtained-negative so far   Dialyzes at [facility]- MWF- 4 hours  EDW 101.5. HD [place] 2/2.5, Dialyzer 180, Heparin yes- 10,000. Access TDC. Calcitriol 0.75 TIW 1/17 hgb 11.3, last calc 10, last K 5.0, alb 4.2, phos 9.9, pth 404  Past Medical History:  Diagnosis Date  . Anemia   . ASCVD (arteriosclerotic cardiovascular disease)    a. s/p prior stenting of LAD b. NSTEMI in 01/2018 requiring DES x3 to RCA given spiral dissection from ostium to mid-RCA but residual disease along distal RCA, LAD and 2nd Mrg  . Calciphylaxis   . Chronic abdominal wound infection   . Dialysis patient ([place])   . ESRD (end stage renal disease) ([place])    Due to membranous GN dialysis 09/1996; peritoneal dialysis --? peitonitis; difficult vascular access  . GERD (gastroesophageal reflux disease)   . Hashimoto thyroiditis   . Hyperparathyroidism   . Hypertension   . Hypothyroidism   . Medically noncompliant   . Morbid obesity ([place])     Past Surgical History:  Procedure Laterality  Date  . [place]  . CORONARY STENT INTERVENTION N/A 10/21/2019   Procedure: CORONARY STENT INTERVENTION;  Surgeon: Wellington Hampshire, MD;  Location: [place] [facility];  Service: Cardiovascular;  Laterality: N/A;  . LEFT HEART CATH AND CORONARY ANGIOGRAPHY N/A 02/11/2018   Procedure: LEFT HEART CATH AND CORONARY ANGIOGRAPHY;  Surgeon: Charolette Forward, MD;  Location: [place] [facility];  Service: Cardiovascular;  Laterality: N/A;  . LEFT HEART CATH AND CORONARY ANGIOGRAPHY N/A 10/14/2019   Procedure: LEFT HEART CATH AND CORONARY ANGIOGRAPHY;  Surgeon: Wellington Hampshire, MD;  Location: [place] [facility];  Service: Cardiovascular;  Laterality: N/A;  . THYROIDECTOMY, PARTIAL     Resectin of left lobe with reimplantation in the forearm, small focus of papillary carcinoma incidentally noted at pathology - 2000 and Hashimoto's thyrdoiditis in 2001  . TONSILLECTOMY AND ADENOIDECTOMY      Family History  Problem Relation Age of Onset  . Coronary artery disease Mother   . Kidney disease Father   . Diabetes Sister     Social History:  reports that she has quit smoking. She has never used smokeless tobacco. She reports that she does not drink alcohol and does not use drugs.  Allergies:  Allergies  Allergen Reactions  . Activase [Alteplase] Shortness Of Breath  . Bee Pollen Anaphylaxis  . Warfarin Sodium Nausea And Vomiting and Rash    Medications: I have reviewed the patient's current medications.  Results for orders placed or performed during the hospital encounter of 12/14/20 (from the past 48 hour(s))  Blood culture (routine x 2)     Status: None (Preliminary result)   Collection Time: 12/14/20  2:18 PM   Specimen: Right Antecubital; Blood  Result Value Ref Range   Specimen Description RIGHT ANTECUBITAL    Special Requests      BOTTLES DRAWN AEROBIC AND ANAEROBIC Blood Culture results may not be optimal due to an inadequate volume of blood received in culture bottles   Culture       NO GROWTH < 12 HOURS Performed at [facility], [address] [place], [place] [address]    Report Status PENDING   Lactic acid, plasma     Status: Abnormal   Collection Time: 12/14/20  4:18 PM  Result Value Ref Range   Lactic Acid, Venous 2.0 (HH) 0.5 - 1.9 mmol/L    Comment: CRITICAL RESULT CALLED TO, READ BACK BY AND VERIFIED WITH: A HARRY,RN'@2135'$  12/14/20 MKELLY Performed at [facility], [address] [place], [place] [address]   CBC with Differential     Status: Abnormal   Collection Time: 12/14/20  5:15 PM  Result Value Ref Range   WBC 20.6 (H) 4.0 - 10.5 K/uL   RBC 3.53 (L) 3.87 - 5.11 MIL/uL   Hemoglobin 11.4 (L) 12.0 - 15.0 g/dL   HCT 35.1 (L) 36.0 - 46.0 %   MCV 99.4 80.0 - 100.0 fL   MCH 32.3 26.0 - 34.0 pg   MCHC 32.5 30.0 - 36.0 g/dL   RDW 17.0 (H) 11.5 - 15.5 %   Platelets 167 150 - 400 K/uL   nRBC 0.0 0.0 - 0.2 %   Neutrophils Relative % 82 %   Neutro Abs 16.9 (H) 1.7 - 7.7 K/uL   Lymphocytes Relative 5 %   Lymphs Abs 0.9 0.7 - 4.0 K/uL   Monocytes Relative 9 %   Monocytes Absolute 1.9 (H) 0.1 - 1.0 K/uL   Eosinophils Relative 1 %   Eosinophils Absolute 0.3 0.0 - 0.5 K/uL   Basophils Relative 1 %   Basophils Absolute 0.1 0.0 - 0.1 K/uL   Immature Granulocytes 2 %   Abs Immature Granulocytes 0.38 (H) 0.00 - 0.07 K/uL    Comment: Performed at [facility], [address] [place], [place] [address]  Comprehensive metabolic panel     Status: Abnormal   Collection Time: 12/14/20  5:15 PM  Result Value Ref Range   Sodium 132 (L) 135 - 145 mmol/L   Potassium 5.1 3.5 - 5.1 mmol/L    Comment: SLIGHT HEMOLYSIS   Chloride 94 (L) 98 - 111 mmol/L   CO2 21 (L) 22 - 32 mmol/L   Glucose, Bld 80 70 - 99 mg/dL    Comment: Glucose reference range applies only to samples taken after fasting for at least 8 hours.   BUN 40 (H) 6 - 20 mg/dL   Creatinine, Ser 9.68 (H) 0.44 - 1.00 mg/dL   Calcium 8.2 (L) 8.9 - 10.3 mg/dL   Total Protein 7.1 6.5 - 8.1 g/dL   Albumin 3.5  3.5 - 5.0 g/dL   AST 65 (H) 15 - 41 U/L   ALT 27 0 - 44 U/L   Alkaline Phosphatase 50 38 - 126 U/L   Total Bilirubin 0.6 0.3 - 1.2 mg/dL   GFR, Estimated 5 (L) >60 mL/min    Comment: (NOTE) Calculated using the CKD-EPI Creatinine Equation (2021)    Anion  gap 17 (H) 5 - 15    Comment: Performed at [facility], [address] [place], [place] [address]  Lactic acid, plasma     Status: None   Collection Time: 12/14/20  5:15 PM  Result Value Ref Range   Lactic Acid, Venous 1.8 0.5 - 1.9 mmol/L    Comment: Performed at [facility], [address] [place], [place] [address]  Resp Panel by RT-PCR (Flu A&B, Covid) Nasopharyngeal Swab     Status: Abnormal   Collection Time: 12/14/20  7:21 PM   Specimen: Nasopharyngeal Swab; Nasopharyngeal(NP) swabs in vial transport medium  Result Value Ref Range   SARS Coronavirus 2 by RT PCR POSITIVE (A) NEGATIVE    Comment: RESULT CALLED TO, READ BACK BY AND VERIFIED WITH: HARRY,A @ 2016 ON 12/14/20 BY JUW (NOTE) SARS-CoV-2 target nucleic acids are DETECTED.  The SARS-CoV-2 RNA is generally detectable in upper respiratory specimens during the acute phase of infection. Positive results are indicative of the presence of the identified virus, but do not rule out bacterial infection or co-infection with other pathogens not detected by the test. Clinical correlation with patient history and other diagnostic information is necessary to determine patient infection status. The expected result is Negative.  Fact Sheet for Patients: EntrepreneurPulse.com.au  Fact Sheet for Healthcare Providers: IncredibleEmployment.be  This test is not yet approved or cleared by the [place] FDA and  has been authorized for detection and/or diagnosis of SARS-CoV-2 by FDA under an Emergency Use Authorization (EUA).  This EUA will remain in effect (meaning this test can be  used) for the duration of  the COVID-19 declaration under  Section 564(b)(1) of the Act, 21 U.S.C. section 360bbb-3(b)(1), unless the authorization is terminated or revoked sooner.     Influenza A by PCR NEGATIVE NEGATIVE   Influenza B by PCR NEGATIVE NEGATIVE    Comment: (NOTE) The Xpert Xpress SARS-CoV-2/FLU/RSV plus assay is intended as an aid in the diagnosis of influenza from Nasopharyngeal swab specimens and should not be used as a sole basis for treatment. Nasal washings and aspirates are unacceptable for Xpert Xpress SARS-CoV-2/FLU/RSV testing.  Fact Sheet for Patients: EntrepreneurPulse.com.au  Fact Sheet for Healthcare Providers: IncredibleEmployment.be  This test is not yet approved or cleared by the [place] FDA and has been authorized for detection and/or diagnosis of SARS-CoV-2 by FDA under an Emergency Use Authorization (EUA). This EUA will remain in effect (meaning this test can be used) for the duration of the COVID-19 declaration under Section 564(b)(1) of the Act, 21 U.S.C. section 360bbb-3(b)(1), unless the authorization is terminated or revoked.  Performed at [facility], [address], [place], [place] [address]   Blood culture (routine x 2)     Status: None (Preliminary result)   Collection Time: 12/14/20  9:25 PM   Specimen: Neck; Blood  Result Value Ref Range   Specimen Description NECK    Special Requests      BOTTLES DRAWN AEROBIC AND ANAEROBIC Blood Culture adequate volume   Culture      NO GROWTH < 12 HOURS Performed at [facility], [address], [place], [place] [address]    Report Status PENDING   Basic metabolic panel     Status: Abnormal   Collection Time: 12/15/20  6:13 AM  Result Value Ref Range   Sodium 130 (L) 135 - 145 mmol/L   Potassium 4.8 3.5 - 5.1 mmol/L   Chloride 92 (L) 98 - 111 mmol/L   CO2 21 (L)  22 - 32 mmol/L   Glucose, Bld 86 70 - 99 mg/dL    Comment: Glucose reference range applies only to samples taken after fasting for at least 8  hours.   BUN 43 (H) 6 - 20 mg/dL   Creatinine, Ser 9.98 (H) 0.44 - 1.00 mg/dL   Calcium 8.0 (L) 8.9 - 10.3 mg/dL   GFR, Estimated 4 (L) >60 mL/min    Comment: (NOTE) Calculated using the CKD-EPI Creatinine Equation (2021)    Anion gap 17 (H) 5 - 15    Comment: Performed at [facility], [address], [place], [place] [address]  CBC     Status: Abnormal   Collection Time: 12/15/20  6:13 AM  Result Value Ref Range   WBC 17.2 (H) 4.0 - 10.5 K/uL   RBC 3.45 (L) 3.87 - 5.11 MIL/uL   Hemoglobin 11.0 (L) 12.0 - 15.0 g/dL   HCT 34.3 (L) 36.0 - 46.0 %   MCV 99.4 80.0 - 100.0 fL   MCH 31.9 26.0 - 34.0 pg   MCHC 32.1 30.0 - 36.0 g/dL   RDW 16.8 (H) 11.5 - 15.5 %   Platelets 152 150 - 400 K/uL   nRBC 0.0 0.0 - 0.2 %    Comment: Performed at [facility], [address] [place], [place] [address]    CT ABDOMEN PELVIS WO CONTRAST  Result Date: 12/14/2020 CLINICAL DATA:  Generalized abdominal pain following fall, initial encounter EXAM: CT ABDOMEN AND PELVIS WITHOUT CONTRAST TECHNIQUE: Multidetector CT imaging of the abdomen and pelvis was performed following the standard protocol without IV contrast. COMPARISON:  05/03/2013 FINDINGS: Lower chest: No acute abnormality. Heavy coronary calcifications are noted. Hepatobiliary: Tiny dependent gallstones are noted. The gallbladder is otherwise within normal limits. Liver is unremarkable. Pancreas: Unremarkable. No pancreatic ductal dilatation or surrounding inflammatory changes. Spleen: Normal in size without focal abnormality. Adrenals/Urinary Tract: Adrenal glands are within normal limits. Kidneys demonstrate significant cortical thinning with multiple cysts the largest of which lies in the lower pole of the right kidney measuring 5.8 cm in greatest dimension. This has increased in size from the prior exam at which time it measured 4.6 cm. No obstructive changes are seen. No definitive renal calculi are noted. The bladder is decompressed. These changes  are consistent with the patient's known history of end-stage renal failure. Stomach/Bowel: Colon is well visualized without obstructive or inflammatory changes. The appendix is within normal limits. No small bowel or gastric abnormality is noted. Vascular/Lymphatic: Aortic atherosclerosis. No enlarged abdominal or pelvic lymph nodes. Left retroaortic renal vein is noted. Multiple collaterals are noted in the anterior abdominal wall likely related to central venous stenosis. These are stable from the prior exam. Calcified graft in the left thigh proximally is noted. Reproductive: Uterus and bilateral adnexa are unremarkable. Other: No abdominal wall hernia or abnormality. No abdominopelvic ascites. Musculoskeletal: No acute or significant osseous findings. IMPRESSION: Cholelithiasis without complicating factors. Changes consistent with end-stage renal failure with multiple renal cysts bilaterally and diffuse cortical thinning. Aortic atherosclerotic changes. Multiple venous collaterals in the anterior abdominal wall consistent with central venous stenosis likely related to the known dialysis. No acute abnormality related to the recent injury is noted. Electronically Signed   By: Inez Catalina M.D.   On: 12/14/2020 17:10   DG Chest 2 View  Result Date: 12/14/2020 CLINICAL DATA:  Pain following fall EXAM: CHEST - 2 VIEW COMPARISON:  October 12, 2019 chest radiograph and chest CT October 16, 2019 FINDINGS: Central catheter tip  is in the superior vena cava. No pneumothorax. There is no edema or airspace opacity. Heart is mildly enlarged with pulmonary vascularity normal. No adenopathy. No bone lesions. Postoperative change in the thyroid region noted. IMPRESSION: Central catheter as described without pneumothorax. Lungs clear. Heart mildly enlarged. Electronically Signed   By: Lowella Grip III M.D.   On: 12/14/2020 15:16   CT Head Wo Contrast  Result Date: 12/14/2020 CLINICAL DATA:  Fall.  Hit head. EXAM:  CT HEAD WITHOUT CONTRAST CT CERVICAL SPINE WITHOUT CONTRAST TECHNIQUE: Multidetector CT imaging of the head and cervical spine was performed following the standard protocol without intravenous contrast. Multiplanar CT image reconstructions of the cervical spine were also generated. COMPARISON:  CT head dated June 15, 2010. FINDINGS: CT HEAD FINDINGS Brain: No evidence of acute infarction, hemorrhage, hydrocephalus, extra-axial collection or mass lesion/mass effect. Tiny old lacunar infarct in the right basal ganglia. Vascular: Calcified atherosclerosis at the skullbase. No hyperdense vessel. Skull: Thickened skull with diffuse sclerosis and patchy sclerotic foci, consistent with renal osteodystrophy. No fracture. Sinuses/Orbits: No acute finding. Other: None. CT CERVICAL SPINE FINDINGS Alignment: Mild reversal of the normal cervical lordosis. No traumatic malalignment. Skull base and vertebrae: No acute fracture. No primary bone lesion or focal pathologic process. Soft tissues and spinal canal: No prevertebral fluid or swelling. No visible canal hematoma. Disc levels: Mild disc height loss at C6-C7. Mild right uncovertebral hypertrophy at C2-C3 and C3-C4. Upper chest: Negative. Other: Prior thyroidectomy. IMPRESSION: 1. No acute intracranial abnormality. Old right basal ganglia lacunar infarct. 2. No acute cervical spine fracture or traumatic listhesis. 3. Renal osteodystrophy. Electronically Signed   By: Titus Dubin M.D.   On: 12/14/2020 15:15   CT Cervical Spine Wo Contrast  Result Date: 12/14/2020 CLINICAL DATA:  Fall.  Hit head. EXAM: CT HEAD WITHOUT CONTRAST CT CERVICAL SPINE WITHOUT CONTRAST TECHNIQUE: Multidetector CT imaging of the head and cervical spine was performed following the standard protocol without intravenous contrast. Multiplanar CT image reconstructions of the cervical spine were also generated. COMPARISON:  CT head dated June 15, 2010. FINDINGS: CT HEAD FINDINGS Brain: No evidence of  acute infarction, hemorrhage, hydrocephalus, extra-axial collection or mass lesion/mass effect. Tiny old lacunar infarct in the right basal ganglia. Vascular: Calcified atherosclerosis at the skullbase. No hyperdense vessel. Skull: Thickened skull with diffuse sclerosis and patchy sclerotic foci, consistent with renal osteodystrophy. No fracture. Sinuses/Orbits: No acute finding. Other: None. CT CERVICAL SPINE FINDINGS Alignment: Mild reversal of the normal cervical lordosis. No traumatic malalignment. Skull base and vertebrae: No acute fracture. No primary bone lesion or focal pathologic process. Soft tissues and spinal canal: No prevertebral fluid or swelling. No visible canal hematoma. Disc levels: Mild disc height loss at C6-C7. Mild right uncovertebral hypertrophy at C2-C3 and C3-C4. Upper chest: Negative. Other: Prior thyroidectomy. IMPRESSION: 1. No acute intracranial abnormality. Old right basal ganglia lacunar infarct. 2. No acute cervical spine fracture or traumatic listhesis. 3. Renal osteodystrophy. Electronically Signed   By: Titus Dubin M.D.   On: 12/14/2020 15:15   DG Foot Complete Left  Result Date: 12/14/2020 CLINICAL DATA:  Wound on foot near fourth and fifth MTP EXAM: LEFT FOOT - COMPLETE 3+ VIEW COMPARISON:  None. FINDINGS: No fracture or malalignment. Vascular calcifications. No soft tissue emphysema or radiopaque foreign body. IMPRESSION: No acute osseous abnormality. Electronically Signed   By: Donavan Foil M.D.   On: 12/14/2020 15:18   DG Hips Bilat W or Wo Pelvis 3-4 Views  Result Date: 12/14/2020 CLINICAL  DATA:  Fall, initial encounter. EXAM: DG HIP (WITH OR WITHOUT PELVIS) 3-4V BILAT COMPARISON:  None. FINDINGS: Image quality is degraded by body habitus. No acute osseous or joint abnormality. Vascular stent is seen in the left femoral vasculature. IMPRESSION: Image quality is degraded by body habitus. No definite acute osseous or joint abnormality. Electronically Signed   By:  Lorin Picket M.D.   On: 12/14/2020 15:17    ROS: Not obtained as patient was not seen Blood pressure 138/86, pulse (!) 58, temperature 97.6 F (36.4 C), temperature source Oral, resp. rate 18, weight 100.6 kg, SpO2 100 %. Physical exam not performed secondary to COVID precautions  Assessment/Plan: 50 year old but chronically ill white female with medical issues including ESRD being admitted for leukocytosis, hypotension and COVID. 1 sepsis-scenario seeming to be consistent with sepsis due to COVID or other-possibility of bacteremia from [facility].  Has been started on Maxipime, Zosyn, Flagyl, remdesivir, and vanc.  Blood cultures pending 2 ESRD: Normally a Monday, Wednesday, Friday dialysis patient.  Received over 2 hours of dialysis yesterday 1/19.  There are no current needs for dialysis and patient is slightly hemodynamically unstable.  We will continue to follow daily for dialysis need and stability-  May need tomorrow  3 Hypertension: Now is hypotensive.  Currently being given fluid.  May require pressors 4. Anemia of ESRD: Not a major issue at this point-no ESA needed 5. Metabolic Bone Disease: We will continue home calcitriol-no binders for now, follow phosphorus   Louis Meckel 12/15/2020, 10:37 AM

## 2020-12-16 ENCOUNTER — Inpatient Hospital Stay (HOSPITAL_COMMUNITY): Payer: Medicare Other

## 2020-12-16 ENCOUNTER — Other Ambulatory Visit: Payer: Self-pay

## 2020-12-16 DIAGNOSIS — Z7189 Other specified counseling: Secondary | ICD-10-CM

## 2020-12-16 DIAGNOSIS — N186 End stage renal disease: Secondary | ICD-10-CM | POA: Diagnosis not present

## 2020-12-16 DIAGNOSIS — I4891 Unspecified atrial fibrillation: Secondary | ICD-10-CM | POA: Diagnosis not present

## 2020-12-16 DIAGNOSIS — L089 Local infection of the skin and subcutaneous tissue, unspecified: Secondary | ICD-10-CM | POA: Diagnosis not present

## 2020-12-16 DIAGNOSIS — R9431 Abnormal electrocardiogram [ECG] [EKG]: Secondary | ICD-10-CM

## 2020-12-16 DIAGNOSIS — T148XXA Other injury of unspecified body region, initial encounter: Secondary | ICD-10-CM | POA: Diagnosis not present

## 2020-12-16 DIAGNOSIS — Z515 Encounter for palliative care: Secondary | ICD-10-CM

## 2020-12-16 DIAGNOSIS — E785 Hyperlipidemia, unspecified: Secondary | ICD-10-CM | POA: Diagnosis not present

## 2020-12-16 DIAGNOSIS — L03032 Cellulitis of left toe: Secondary | ICD-10-CM

## 2020-12-16 LAB — RENAL FUNCTION PANEL
Albumin: 3 g/dL — ABNORMAL LOW (ref 3.5–5.0)
Anion gap: 19 — ABNORMAL HIGH (ref 5–15)
BUN: 53 mg/dL — ABNORMAL HIGH (ref 6–20)
CO2: 18 mmol/L — ABNORMAL LOW (ref 22–32)
Calcium: 7.9 mg/dL — ABNORMAL LOW (ref 8.9–10.3)
Chloride: 93 mmol/L — ABNORMAL LOW (ref 98–111)
Creatinine, Ser: 11.27 mg/dL — ABNORMAL HIGH (ref 0.44–1.00)
GFR, Estimated: 4 mL/min — ABNORMAL LOW (ref >60)
Glucose, Bld: 79 mg/dL (ref 70–99)
Phosphorus: 7.4 mg/dL — ABNORMAL HIGH (ref 2.5–4.6)
Potassium: 4.9 mmol/L (ref 3.5–5.1)
Sodium: 130 mmol/L — ABNORMAL LOW (ref 135–145)

## 2020-12-16 LAB — CBC
HCT: 32.9 % — ABNORMAL LOW (ref 36.0–46.0)
Hemoglobin: 10.6 g/dL — ABNORMAL LOW (ref 12.0–15.0)
MCH: 32.2 pg (ref 26.0–34.0)
MCHC: 32.2 g/dL (ref 30.0–36.0)
MCV: 100 fL (ref 80.0–100.0)
Platelets: 150 10*3/uL (ref 150–400)
RBC: 3.29 MIL/uL — ABNORMAL LOW (ref 3.87–5.11)
RDW: 16.8 % — ABNORMAL HIGH (ref 11.5–15.5)
WBC: 15.4 10*3/uL — ABNORMAL HIGH (ref 4.0–10.5)
nRBC: 0 % (ref 0.0–0.2)

## 2020-12-16 LAB — ECHOCARDIOGRAM COMPLETE
AR max vel: 2.31 cm2
AV Area VTI: 2.51 cm2
AV Area mean vel: 2.63 cm2
AV Mean grad: 8.3 mmHg
AV Peak grad: 16.3 mmHg
Ao pk vel: 2.02 m/s
Area-P 1/2: 2.01 cm2
Height: 63 in
S' Lateral: 1.8 cm
Weight: 3876.57 oz

## 2020-12-16 MED ORDER — CHLORHEXIDINE GLUCONATE CLOTH 2 % EX PADS
6.0000 | MEDICATED_PAD | Freq: Every day | CUTANEOUS | Status: DC
  Administered 2020-12-16 – 2020-12-18 (×3): 6 via TOPICAL

## 2020-12-16 MED ORDER — MIDODRINE HCL 10 MG PO TABS: 10.0000 mg | ORAL_TABLET | Freq: Three times a day (TID) | ORAL | Status: DC

## 2020-12-16 MED ORDER — CALCITRIOL 0.25 MCG PO CAPS: 0.7500 ug | ORAL_CAPSULE | ORAL | Status: AC

## 2020-12-16 MED ORDER — FENTANYL CITRATE (PF) 100 MCG/2ML IJ SOLN
25.0000 ug | INTRAMUSCULAR | Status: DC | PRN
  Administered 2020-12-16 – 2020-12-17 (×5): 25 ug via INTRAVENOUS
  Filled 2020-12-16 (×5): qty 2

## 2020-12-16 MED ORDER — VANCOMYCIN HCL IN DEXTROSE 1-5 GM/200ML-% IV SOLN: 1000.0000 mg | INTRAVENOUS | Status: DC

## 2020-12-16 MED ORDER — FENTANYL CITRATE (PF) 100 MCG/2ML IJ SOLN: 25.0000 ug | INTRAMUSCULAR | 0 refills | Status: DC | PRN

## 2020-12-16 MED ORDER — HEPARIN SODIUM (PORCINE) 1000 UNIT/ML DIALYSIS
20.0000 [IU]/kg | INTRAMUSCULAR | Status: DC | PRN
  Administered 2020-12-16: 2200 [IU] via INTRAVENOUS_CENTRAL

## 2020-12-16 MED ORDER — HYDROCODONE-ACETAMINOPHEN 5-325 MG PO TABS
1.0000 | ORAL_TABLET | ORAL | Status: DC | PRN
  Administered 2020-12-16 – 2020-12-17 (×4): 1 via ORAL
  Filled 2020-12-16 (×4): qty 1

## 2020-12-16 MED ORDER — SODIUM CHLORIDE 0.9 % IV SOLN: 100.0000 mL | INTRAVENOUS | Status: DC | PRN

## 2020-12-16 MED ORDER — METRONIDAZOLE IN NACL 5-0.79 MG/ML-% IV SOLN: 500.0000 mg | Freq: Three times a day (TID) | INTRAVENOUS | Status: DC

## 2020-12-16 MED ORDER — PANTOPRAZOLE SODIUM 40 MG PO TBEC: 40.0000 mg | DELAYED_RELEASE_TABLET | Freq: Every day | ORAL | Status: AC

## 2020-12-16 MED ORDER — ALBUMIN HUMAN 25 % IV SOLN
INTRAVENOUS | Status: AC
  Administered 2020-12-16: 25 g
  Filled 2020-12-16: qty 100

## 2020-12-16 MED ORDER — ALBUMIN HUMAN 25 % IV SOLN: 25.0000 g | Freq: Once | INTRAVENOUS | Status: AC

## 2020-12-16 MED ORDER — PERFLUTREN LIPID MICROSPHERE
1.0000 mL | INTRAVENOUS | Status: AC | PRN
  Filled 2020-12-16: qty 10

## 2020-12-16 MED ORDER — HEPARIN SODIUM (PORCINE) 1000 UNIT/ML DIALYSIS
1000.0000 [IU] | INTRAMUSCULAR | Status: DC | PRN
  Administered 2020-12-16: 4100 [IU] via INTRAVENOUS_CENTRAL

## 2020-12-16 MED ORDER — SODIUM CHLORIDE 0.9 % IV SOLN: 2.0000 g | INTRAVENOUS | Status: DC

## 2020-12-16 MED ORDER — CALCITRIOL 0.25 MCG PO CAPS
0.7500 ug | ORAL_CAPSULE | ORAL | Status: DC
  Administered 2020-12-16 – 2020-12-19 (×2): 0.75 ug via ORAL
  Filled 2020-12-16 (×3): qty 3

## 2020-12-16 MED ORDER — NYSTATIN 100000 UNIT/GM EX POWD: Freq: Two times a day (BID) | CUTANEOUS | 0 refills | Status: DC

## 2020-12-16 NOTE — Progress Notes (Signed)
Patient complaining of severe left foot pain. Patient is holding left foot, crying out. Patient was given PRN norco PO and an ice pack to help relieve pain. Patient is requesting something to go through her IV for faster relief. Dr. Wynetta Emery will be made aware on morning rounds.

## 2020-12-16 NOTE — Progress Notes (Signed)
12/16/2020 12:44 PM  I spoke with Dr. Delana Meyer per patient's request and reviewed case. He agreed to see patient in consultation if we could get her transferred to [facility] but did now know what current bed situation was.  I spoke with [facility] and Bed Placement and transfer request placed.  Discussed case and plan with Dr. Arnoldo Morale.   Murvin Natal MD How to contact the [facility] Attending or Consulting provider [place] or covering provider during after hours [place], for this patient?  1. Check the care team in [facility] and look for a) attending/consulting TR[facility] provider listed and b) the [facility] team listed 2. Log into [email or web address] and use 's universal password to access. If you do not have the password, please contact the hospital operator. 3. Locate the [facility] provider you are looking for under [facility] and page to a number that you can be directly reached. 4. If you still have difficulty reaching the provider, please page the [facility] (Director on Call) for the Hospitalists listed on amion for assistance.

## 2020-12-16 NOTE — Progress Notes (Addendum)
PROGRESS NOTE   Lori [name]  [id number] DOB: Jul 03, 1971 DOA: 12/14/2020 PCP: Patient, No Pcp Per   Chief Complaint  Patient presents with   Fall    Brief Admission History:   50 y.o. female, past medical history of CAD status post stent to LAD and 2020, ESRD on hemodialysis Monday Wednesday Friday, followed by Dr. Raquel Sarna, hypertension, hypothyroidism, hyperparathyroidism, GERD. -Patient presents to ED status post fall, head pain, back pain and generalized weakness, patient reported mechanical fall as she was getting out of the [place] for her hemodialysis, she does report a fall with head trauma, denies any syncope or near syncope, for very generalized weak, fatigue, reports she did not finish her dialysis due to weakness, and she was sent to ED for further evaluation, any fever, chills, cough, shortness of breath, patient reports left foot wound for last month, she has been following by podiatry in [place], she finished antibiotic course last week, report wound is not improving, as well reports she has been out of her cardiac medications for last few weeks, and could not renew as she did not see her cardiologist (Dr. Terrence Dupont).  Patient denies any respiratory symptoms, no cough, no shortness of breath, reports she tested negative for COVID last week.  Her screening COVID-19 test came back positive in ED  -Presentation to ED, blood pressure was soft, white blood cell elevated at 20 K, but she had normal lactic acid, x-ray of infected foot with no evidence of osteomyelitis, hypotensive responded to fluid bolus, [facility] hospitalist consulted to admit.   Assessment & Plan:   Active Problems:   ESRD on dialysis ([place])   BMI 40.0-44.9, adult (HCC)   Atrial fibrillation (HCC)   Hyperlipidemia, unspecified   Infected wound   Cellulitis of fourth toe of left foot  Sepsis ruled out.  Left foot wound infection of left 4th/5th toes limited to skin breakdown - Unfortunately from the vascular studies done  she appears to have chronic ischemia of the left lower extremity with very poor vascular flow of the left foot.  I discussed with Dr. Arnoldo Morale and patient not ready to consider amputation at this time but wants to have evaluation and opinion from her primary vascular surgeon Dr. Delana Meyer at [facility].  I spoke with Dr. Delana Meyer and he is agreeable to consult on patient if we transfer to [facility]. Transfer orders placed.  Continue broad spectrum antibiotics.  No evidence of osteomyelitis.   Left foot and leg pain - secondary to chronic ischemia, added fentanyl IV for severe pain symptoms.     Covid Infection - incidental finding - given high risk status started on remdesivir x 3 day course.  CAD - continue aspirin and brilinta.  Chronic atrial fibrillation - Pt says she has not been taking her amiodarone.  HR controlled. Hypotension - has not been persistently low but has intermittently been low, she is on midodrine.  Continue to follow.  Hypothyroidism - continue levothyroxine.    DVT prophylaxis: heparin Code Status: full  Family Communication: call to daughter was rejected x 2 Disposition: home with [facility]  Status is: Inpatient  Remains inpatient appropriate because:IV treatments appropriate due to intensity of illness or inability to take PO and Inpatient level of care appropriate due to severity of illness  Dispo: The patient is from: Home              Anticipated d/c is to: Home  Anticipated d/c date is: 1 day              Patient currently is not medically stable to d/c.  Consultants:  Surgery Nephrology  Procedures:    Antimicrobials:    Subjective: Pt reports unrelenting left foot pain and burning, requesting IV pain medication for relief. Pt reports she only wants her vascular surgeon to do surgery as he will "save" her left leg   Objective: Vitals:   12/16/20 0900 12/16/20 1000 12/16/20 1140 12/16/20 1300  BP:  139/77  (!) 135/92  Pulse: (!) 57   (!) 55  Resp: '13 15  11   '$ Temp:   97.7 F (36.5 C)   TempSrc:   Oral   SpO2: 100%   100%  Weight:      Height:        Intake/Output Summary (Last 24 hours) at 12/16/2020 1424 Last data filed at 12/16/2020 0400 Gross per 24 hour  Intake 400 ml  Output --  Net 400 ml   Filed Weights   12/14/20 1402 12/15/20 1244 12/16/20 0500  Weight: 100.6 kg 109.5 kg 109.9 kg    Examination:  General exam: chronically ill appearing female, Appears calm and comfortable  Respiratory system: Clear to auscultation. Respiratory effort normal. Cardiovascular system: normal S1 & S2 heard. No JVD, murmurs, rubs, gallops or clicks. No pedal edema. Gastrointestinal system: Abdomen is nondistended, soft and nontender. No organomegaly or masses felt. Normal bowel sounds heard. Central nervous system: Alert and oriented. No focal neurological deficits. Extremities: cool left leg, left foot wound unchanged, small necrotic area seen on dorsal left foot, no drainage seen. No pedal pulses palpated in left foot.  Skin: No rashes, lesions or ulcers Psychiatry: Judgement and insight appear normal. Mood & affect appropriate.   Data Reviewed: I have personally reviewed following labs and imaging studies  CBC: Recent Labs  Lab 12/14/20 1715 12/15/20 0613 12/16/20 0417  WBC 20.6* 17.2* 15.4*  NEUTROABS 16.9*  --   --   HGB 11.4* 11.0* 10.6*  HCT 35.1* 34.3* 32.9*  MCV 99.4 99.4 100.0  PLT 167 152 [id number]    Basic Metabolic Panel: Recent Labs  Lab 12/14/20 1715 12/15/20 0613 12/16/20 0417  NA 132* 130* 130*  K 5.1 4.8 4.9  CL 94* 92* 93*  CO2 21* 21* 18*  GLUCOSE 80 86 79  BUN 40* 43* 53*  CREATININE 9.68* 9.98* 11.27*  CALCIUM 8.2* 8.0* 7.9*  PHOS  --   --  7.4*    GFR: Estimated Creatinine Clearance: 7 mL/min (A) (by C-G formula based on SCr of 11.27 mg/dL (H)).  Liver Function Tests: Recent Labs  Lab 12/14/20 1715 12/16/20 0417  AST 65*  --   ALT 27  --   ALKPHOS 50  --   BILITOT 0.6  --   PROT 7.1  --    ALBUMIN 3.5 3.0*    CBG: No results for input(s): GLUCAP in the last 168 hours.  Recent Results (from the past 240 hour(s))  Blood culture (routine x 2)     Status: None (Preliminary result)   Collection Time: 12/14/20  2:18 PM   Specimen: Right Antecubital; Blood  Result Value Ref Range Status   Specimen Description RIGHT ANTECUBITAL  Final   Special Requests   Final    BOTTLES DRAWN AEROBIC AND ANAEROBIC Blood Culture results may not be optimal due to an inadequate volume of blood received in culture bottles   Culture   Final  NO GROWTH 2 DAYS Performed at [facility], [address], [place], [place] [address]    Report Status PENDING  Incomplete  Resp Panel by RT-PCR (Flu A&B, Covid) Nasopharyngeal Swab     Status: Abnormal   Collection Time: 12/14/20  7:21 PM   Specimen: Nasopharyngeal Swab; Nasopharyngeal(NP) swabs in vial transport medium  Result Value Ref Range Status   SARS Coronavirus 2 by RT PCR POSITIVE (A) NEGATIVE Final    Comment: RESULT CALLED TO, READ BACK BY AND VERIFIED WITH: HARRY,A @ 2016 ON 12/14/20 BY JUW (NOTE) SARS-CoV-2 target nucleic acids are DETECTED.  The SARS-CoV-2 RNA is generally detectable in upper respiratory specimens during the acute phase of infection. Positive results are indicative of the presence of the identified virus, but do not rule out bacterial infection or co-infection with other pathogens not detected by the test. Clinical correlation with patient history and other diagnostic information is necessary to determine patient infection status. The expected result is Negative.  Fact Sheet for Patients: EntrepreneurPulse.com.au  Fact Sheet for Healthcare Providers: IncredibleEmployment.be  This test is not yet approved or cleared by the [place] FDA and  has been authorized for detection and/or diagnosis of SARS-CoV-2 by FDA under an Emergency Use Authorization (EUA).  This EUA  will remain in effect (meaning this test can be  used) for the duration of  the COVID-19 declaration under Section 564(b)(1) of the Act, 21 U.S.C. section 360bbb-3(b)(1), unless the authorization is terminated or revoked sooner.     Influenza A by PCR NEGATIVE NEGATIVE Final   Influenza B by PCR NEGATIVE NEGATIVE Final    Comment: (NOTE) The Xpert Xpress SARS-CoV-2/FLU/RSV plus assay is intended as an aid in the diagnosis of influenza from Nasopharyngeal swab specimens and should not be used as a sole basis for treatment. Nasal washings and aspirates are unacceptable for Xpert Xpress SARS-CoV-2/FLU/RSV testing.  Fact Sheet for Patients: EntrepreneurPulse.com.au  Fact Sheet for Healthcare Providers: IncredibleEmployment.be  This test is not yet approved or cleared by the [place] FDA and has been authorized for detection and/or diagnosis of SARS-CoV-2 by FDA under an Emergency Use Authorization (EUA). This EUA will remain in effect (meaning this test can be used) for the duration of the COVID-19 declaration under Section 564(b)(1) of the Act, 21 U.S.C. section 360bbb-3(b)(1), unless the authorization is terminated or revoked.  Performed at [facility], [address] [place], [place] [address]   Blood culture (routine x 2)     Status: None (Preliminary result)   Collection Time: 12/14/20  9:25 PM   Specimen: Neck; Blood  Result Value Ref Range Status   Specimen Description NECK  Final   Special Requests   Final    BOTTLES DRAWN AEROBIC AND ANAEROBIC Blood Culture adequate volume   Culture   Final    NO GROWTH 2 DAYS Performed at [facility], [address], [place], [place] [address]    Report Status PENDING  Incomplete     Radiology Studies: CT ABDOMEN PELVIS WO CONTRAST  Result Date: 12/14/2020 CLINICAL DATA:  Generalized abdominal pain following fall, initial encounter EXAM: CT ABDOMEN AND PELVIS WITHOUT CONTRAST TECHNIQUE:  Multidetector CT imaging of the abdomen and pelvis was performed following the standard protocol without IV contrast. COMPARISON:  05/03/2013 FINDINGS: Lower chest: No acute abnormality. Heavy coronary calcifications are noted. Hepatobiliary: Tiny dependent gallstones are noted. The gallbladder is otherwise within normal limits. Liver is unremarkable. Pancreas: Unremarkable. No pancreatic ductal dilatation or surrounding inflammatory changes. Spleen:  Normal in size without focal abnormality. Adrenals/Urinary Tract: Adrenal glands are within normal limits. Kidneys demonstrate significant cortical thinning with multiple cysts the largest of which lies in the lower pole of the right kidney measuring 5.8 cm in greatest dimension. This has increased in size from the prior exam at which time it measured 4.6 cm. No obstructive changes are seen. No definitive renal calculi are noted. The bladder is decompressed. These changes are consistent with the patient's known history of end-stage renal failure. Stomach/Bowel: Colon is well visualized without obstructive or inflammatory changes. The appendix is within normal limits. No small bowel or gastric abnormality is noted. Vascular/Lymphatic: Aortic atherosclerosis. No enlarged abdominal or pelvic lymph nodes. Left retroaortic renal vein is noted. Multiple collaterals are noted in the anterior abdominal wall likely related to central venous stenosis. These are stable from the prior exam. Calcified graft in the left thigh proximally is noted. Reproductive: Uterus and bilateral adnexa are unremarkable. Other: No abdominal wall hernia or abnormality. No abdominopelvic ascites. Musculoskeletal: No acute or significant osseous findings. IMPRESSION: Cholelithiasis without complicating factors. Changes consistent with end-stage renal failure with multiple renal cysts bilaterally and diffuse cortical thinning. Aortic atherosclerotic changes. Multiple venous collaterals in the anterior  abdominal wall consistent with central venous stenosis likely related to the known dialysis. No acute abnormality related to the recent injury is noted. Electronically Signed   By: Inez Catalina M.D.   On: 12/14/2020 17:10   DG Chest 2 View  Result Date: 12/14/2020 CLINICAL DATA:  Pain following fall EXAM: CHEST - 2 VIEW COMPARISON:  October 12, 2019 chest radiograph and chest CT October 16, 2019 FINDINGS: Central catheter tip is in the superior vena cava. No pneumothorax. There is no edema or airspace opacity. Heart is mildly enlarged with pulmonary vascularity normal. No adenopathy. No bone lesions. Postoperative change in the thyroid region noted. IMPRESSION: Central catheter as described without pneumothorax. Lungs clear. Heart mildly enlarged. Electronically Signed   By: Lowella Grip III M.D.   On: 12/14/2020 15:16   CT Head Wo Contrast  Result Date: 12/14/2020 CLINICAL DATA:  Fall.  Hit head. EXAM: CT HEAD WITHOUT CONTRAST CT CERVICAL SPINE WITHOUT CONTRAST TECHNIQUE: Multidetector CT imaging of the head and cervical spine was performed following the standard protocol without intravenous contrast. Multiplanar CT image reconstructions of the cervical spine were also generated. COMPARISON:  CT head dated June 15, 2010. FINDINGS: CT HEAD FINDINGS Brain: No evidence of acute infarction, hemorrhage, hydrocephalus, extra-axial collection or mass lesion/mass effect. Tiny old lacunar infarct in the right basal ganglia. Vascular: Calcified atherosclerosis at the skullbase. No hyperdense vessel. Skull: Thickened skull with diffuse sclerosis and patchy sclerotic foci, consistent with renal osteodystrophy. No fracture. Sinuses/Orbits: No acute finding. Other: None. CT CERVICAL SPINE FINDINGS Alignment: Mild reversal of the normal cervical lordosis. No traumatic malalignment. Skull base and vertebrae: No acute fracture. No primary bone lesion or focal pathologic process. Soft tissues and spinal canal: No  prevertebral fluid or swelling. No visible canal hematoma. Disc levels: Mild disc height loss at C6-C7. Mild right uncovertebral hypertrophy at C2-C3 and C3-C4. Upper chest: Negative. Other: Prior thyroidectomy. IMPRESSION: 1. No acute intracranial abnormality. Old right basal ganglia lacunar infarct. 2. No acute cervical spine fracture or traumatic listhesis. 3. Renal osteodystrophy. Electronically Signed   By: Titus Dubin M.D.   On: 12/14/2020 15:15   CT Cervical Spine Wo Contrast  Result Date: 12/14/2020 CLINICAL DATA:  Fall.  Hit head. EXAM: CT HEAD WITHOUT CONTRAST CT CERVICAL  SPINE WITHOUT CONTRAST TECHNIQUE: Multidetector CT imaging of the head and cervical spine was performed following the standard protocol without intravenous contrast. Multiplanar CT image reconstructions of the cervical spine were also generated. COMPARISON:  CT head dated June 15, 2010. FINDINGS: CT HEAD FINDINGS Brain: No evidence of acute infarction, hemorrhage, hydrocephalus, extra-axial collection or mass lesion/mass effect. Tiny old lacunar infarct in the right basal ganglia. Vascular: Calcified atherosclerosis at the skullbase. No hyperdense vessel. Skull: Thickened skull with diffuse sclerosis and patchy sclerotic foci, consistent with renal osteodystrophy. No fracture. Sinuses/Orbits: No acute finding. Other: None. CT CERVICAL SPINE FINDINGS Alignment: Mild reversal of the normal cervical lordosis. No traumatic malalignment. Skull base and vertebrae: No acute fracture. No primary bone lesion or focal pathologic process. Soft tissues and spinal canal: No prevertebral fluid or swelling. No visible canal hematoma. Disc levels: Mild disc height loss at C6-C7. Mild right uncovertebral hypertrophy at C2-C3 and C3-C4. Upper chest: Negative. Other: Prior thyroidectomy. IMPRESSION: 1. No acute intracranial abnormality. Old right basal ganglia lacunar infarct. 2. No acute cervical spine fracture or traumatic listhesis. 3. Renal  osteodystrophy. Electronically Signed   By: Titus Dubin M.D.   On: 12/14/2020 15:15   US ARTERIAL ABI (SCREENING LOWER EXTREMITY)  Result Date: 12/15/2020 CLINICAL DATA:  50 year old female with a history cellulitis of the fourth EXAM: NONINVASIVE PHYSIOLOGIC VASCULAR STUDY OF BILATERAL LOWER EXTREMITIES TECHNIQUE: Evaluation of both lower extremities was performed at rest, including calculation of ankle-brachial indices, multiple segmental pressure evaluation, segmental Doppler and segmental pulse volume recording. COMPARISON:  None. FINDINGS: Right ABI:  Not acquired Left ABI:  Not acquired Right Lower Extremity: Segmental Doppler at the right ankle demonstrates no signal of the posterior tibial artery and monophasic dorsalis pedis. Left Lower Extremity: No signal identified within the posterior tibial artery or dorsalis pedis on the left. IMPRESSION: Resting ABI the bilateral lower extremities was not performed. Monophasic waveform of the dorsalis pedis on the right with no signal of the posterior tibial artery. On the left no signal was identified within the tibial arteries at the ankle. Signed, Dulcy Fanny. Dellia Nims, RPVI [facility] Radiology Electronically Signed   By: Corrie Mckusick D.O.   On: 12/15/2020 11:35   US ARTERIAL LOWER EXTREMITY DUPLEX LEFT (NON-ABI)  Result Date: 12/15/2020 CLINICAL DATA:  50 year old female with a left foot wound EXAM: NONINVASIVE PHYSIOLOGIC VASCULAR STUDY OF UNILATERAL LOWER EXTREMITIES TECHNIQUE: Evaluation of left lower extremities was performed at rest, including directed duplex. COMPARISON:  None. FINDINGS: Left ABI: Not acquired Left Lower Extremity: Directed duplex left lower extremity demonstrates monophasic waveform of the common femoral artery, superficial femoral artery, popliteal artery. Monophasic anterior tibial artery proximally. No signal distally. No signal of the distal peroneal artery or posterior  tibial artery. IMPRESSION: Directed duplex demonstrates monophasic waveform of the common femoral artery, profunda femoris, SFA, popliteal artery. There is no signal identified within the distal anterior tibial artery, posterior tibial artery, peroneal artery. Signed, Dulcy Fanny. Dellia Nims, RPVI [facility] Radiology Electronically Signed   By: Corrie Mckusick D.O.   On: 12/15/2020 15:59   DG Foot Complete Left  Result Date: 12/14/2020 CLINICAL DATA:  Wound on foot near fourth and fifth MTP EXAM: LEFT FOOT - COMPLETE 3+ VIEW COMPARISON:  None. FINDINGS: No fracture or malalignment. Vascular calcifications. No soft tissue emphysema or radiopaque foreign body. IMPRESSION: No acute osseous abnormality. Electronically Signed   By: Donavan Foil M.D.   On: 12/14/2020  15:18   ECHOCARDIOGRAM COMPLETE  Result Date: 12/16/2020    ECHOCARDIOGRAM REPORT   Patient Name:   SCOTTY STOHLER Date of Exam: 12/16/2020 Medical Rec #:  [id number]    Height:       63.0 in Accession #:    [id number]   Weight:       242.3 lb Date of Birth:  [birth year]    BSA:          2.098 m Patient Age:    41 years     BP:           129/68 mmHg Patient Gender: F            HR:           74 bpm. Exam Location:  [name] Procedure: 2D Echo Indications:    Abnormal ECG R94.31  History:        Patient has prior history of Echocardiogram examinations, most                 recent 10/13/2019. TIA, Arrythmias:Atrial Fibrillation,                 Signs/Symptoms:Syncope and Chest Pain; Risk Factors:Dyslipidemia                 and Former Smoker. ESRD.  Sonographer:    Leavy Cella RDCS (AE) Referring Phys: [place]  1. Left ventricular ejection fraction, by estimation, is 55 to 60%. The left ventricle has normal function. Left ventricular endocardial border not optimally defined to evaluate regional wall motion. There is moderate left ventricular hypertrophy. Left ventricular  diastolic parameters are consistent with Grade II diastolic dysfunction (pseudonormalization).  2. Right ventricular systolic function is normal. The right ventricular size is normal. Tricuspid regurgitation signal is inadequate for assessing PA pressure.  3. Left atrial size was severely dilated.  4. The mitral valve is abnormal. Trivial mitral valve regurgitation. Moderate to severe mitral annular calcification.  5. The aortic valve is tricuspid. There is moderate calcification of the aortic valve. Aortic valve regurgitation is not visualized. Mild to moderate aortic valve sclerosis/calcification is present, without any evidence of aortic stenosis. Aortic valve mean gradient measures 8.3 mmHg. Aortic valve Vmax measures 2.02 m/s.  6. The inferior vena cava is normal in size with greater than 50% respiratory variability, suggesting right atrial pressure of 3 mmHg. FINDINGS  Left Ventricle: Left ventricular ejection fraction, by estimation, is 55 to 60%. The left ventricle has normal function. Left ventricular endocardial border not optimally defined to evaluate regional wall motion. The left ventricular internal cavity size was small. There is moderate left ventricular hypertrophy. Left ventricular diastolic parameters are consistent with Grade II diastolic dysfunction (pseudonormalization). Right Ventricle: The right ventricular size is normal. No increase in right ventricular wall thickness. Right ventricular systolic function is normal. Tricuspid regurgitation signal is inadequate for assessing PA pressure. Left Atrium: Left atrial size was severely dilated. Right Atrium: Right atrial size was normal in size. Pericardium: There is no evidence of pericardial effusion. Presence of pericardial fat pad. Mitral Valve: The mitral valve is abnormal. There is mild thickening of the mitral valve leaflet(s). There is mild calcification of the mitral valve leaflet(s). Moderate to severe mitral annular calcification.  Trivial mitral valve regurgitation. Tricuspid Valve: The tricuspid valve is grossly normal. Tricuspid valve regurgitation is trivial. Aortic Valve: The aortic valve is tricuspid. There is moderate calcification of the aortic valve. There is moderate aortic valve annular calcification.  Aortic valve regurgitation is not visualized. Mild to moderate aortic valve sclerosis/calcification is  present, without any evidence of aortic stenosis. Aortic valve mean gradient measures 8.3 mmHg. Aortic valve peak gradient measures 16.3 mmHg. Aortic valve area, by VTI measures 2.51 cm. Pulmonic Valve: The pulmonic valve was not well visualized. Pulmonic valve regurgitation is not visualized. Aorta: The aortic root is normal in size and structure. Venous: The inferior vena cava is normal in size with greater than 50% respiratory variability, suggesting right atrial pressure of 3 mmHg. IAS/Shunts: No atrial level shunt detected by color flow Doppler.  LEFT VENTRICLE PLAX 2D LVIDd:         2.61 cm  Diastology LVIDs:         1.80 cm  LV e' medial:    4.20 cm/s LV PW:         1.59 cm  LV E/e' medial:  29.0 LV IVS:        1.52 cm  LV e' lateral:   3.00 cm/s LVOT diam:     2.10 cm  LV E/e' lateral: 40.7 LV SV:         103 LV SV Index:   49 LVOT Area:     3.46 cm  RIGHT VENTRICLE RV S prime:     9.49 cm/s TAPSE (M-mode): 1.7 cm LEFT ATRIUM              Index       RIGHT ATRIUM           Index LA diam:        3.60 cm  1.72 cm/m  RA Area:     10.80 cm LA Vol (A2C):   44.6 ml  21.26 ml/m RA Volume:   21.80 ml  10.39 ml/m LA Vol (A4C):   108.0 ml 51.48 ml/m LA Biplane Vol: 73.1 ml  34.84 ml/m  AORTIC VALVE AV Area (Vmax):    2.31 cm AV Area (Vmean):   2.63 cm AV Area (VTI):     2.51 cm AV Vmax:           201.67 cm/s AV Vmean:          130.333 cm/s AV VTI:            0.410 m AV Peak Grad:      16.3 mmHg AV Mean Grad:      8.3 mmHg LVOT Vmax:         134.33 cm/s LVOT Vmean:        99.033 cm/s LVOT VTI:          0.297 m LVOT/AV VTI  ratio: 0.72  AORTA Ao Root diam: 2.90 cm MITRAL VALVE MV Area (PHT): 2.01 cm     SHUNTS MV Decel Time: 378 msec     Systemic VTI:  0.30 m MV E velocity: 122.00 cm/s  Systemic Diam: 2.10 cm MV A velocity: 49.60 cm/s MV E/A ratio:  2.46 Rozann Lesches MD Electronically signed by Rozann Lesches MD Signature Date/Time: 12/16/2020/11:20:42 AM    Final    DG Hips Bilat W or Wo Pelvis 3-4 Views  Result Date: 12/14/2020 CLINICAL DATA:  Fall, initial encounter. EXAM: DG HIP (WITH OR WITHOUT PELVIS) 3-4V BILAT COMPARISON:  None. FINDINGS: Image quality is degraded by body habitus. No acute osseous or joint abnormality. Vascular stent is seen in the left femoral vasculature. IMPRESSION: Image quality is degraded by body habitus. No definite acute osseous or joint abnormality. Electronically Signed   By:  Lorin Picket M.D.   On: 12/14/2020 15:17   Scheduled Meds:  aspirin EC  81 mg Oral Daily   calcitRIOL  0.75 mcg Oral Q M,W,F-HD   Chlorhexidine Gluconate Cloth  6 each Topical Daily   Chlorhexidine Gluconate Cloth  6 each Topical Q0600   collagenase   Topical Daily   heparin  5,000 Units Subcutaneous Q8H   levothyroxine  300 mcg Oral QHS   midodrine  10 mg Oral TID WC   nystatin   Topical BID   pantoprazole  40 mg Oral QHS   sevelamer carbonate  3,200 mg Oral TID WC   ticagrelor  90 mg Oral BID   Continuous Infusions:  ceFEPime (MAXIPIME) IV     metronidazole 500 mg (12/16/20 1225)   remdesivir 100 mg in NS 100 mL 100 mg (12/16/20 0824)   vancomycin       LOS: 2 days   Time spent: 37 mins   Salina Stanfield Wynetta Emery, MD How to contact the [facility] Attending or Consulting provider [place] or covering provider during after hours [place], for this patient?  Check the care team in [facility] and look for a) attending/consulting [facility] provider listed and b) the [facility] team listed Log into [email or web address] and use [facility] universal password to access. If you do not have the password, please contact the hospital  operator. Locate the [facility] provider you are looking for under [facility] and page to a number that you can be directly reached. If you still have difficulty reaching the provider, please page the [facility] (Director on Call) for the Hospitalists listed on amion for assistance.  12/16/2020, 2:24 PM

## 2020-12-16 NOTE — Progress Notes (Signed)
OT Cancellation Note  Patient Details Name: Lori [name] MRN: [id number] DOB: November 02, 1971   Cancelled Treatment:    Reason Eval/Treat Not Completed: Medical issues which prohibited therapy. Pt complaining of severe pain in left foot this am, will check back at a later time when pain is controlled and pt able to fully participate.   Guadelupe Sabin, OTR/L  [phone] 12/16/2020, 8:43 AM

## 2020-12-16 NOTE — Progress Notes (Signed)
*  PRELIMINARY RESULTS* Echocardiogram 2D Echocardiogram has been performed.  Lori [name] 12/16/2020, 10:06 AM

## 2020-12-16 NOTE — Consult Note (Addendum)
Consultation Note Date: 12/16/2020   Patient Name: Lori [name]  DOB: 1971/07/05  MRN: [id number]  Age / Sex: 50 y.o., female  PCP: Patient, No Pcp Per Referring Physician: Murlean Iba, MD  Reason for Consultation: Establishing goals of care  HPI/Patient Profile: 50 y.o. female  with past medical history of CAD s/p stent, ESRD on HD MWF, calciphylaxis, hypertension, hypothyroidism, GERD, wounds admitted on 12/14/2020 with fall related to weakness with underlying infected left foot wound and incidental finding of COVID infection.   Clinical Assessment and Goals of Care: I met today with [facility]. Lori [name] is in good spirits. She shares that her pain is much improved with current regimen. Her only complaint is being cold - warm blankets provided and thermostat adjusted. We further discussed concern for her health and this wound. She shares frustration that she felt the wound was healing with antibiotics but then worsening after completed antibiotics. She has had wounds before and feels like with careful monitoring and treatment this will heal. We did discuss the difficulty of poor circulation and could be barrier to healing even with the best care. Lori [name] reports that she is open to all medical interventions and procedures needed. She would even be open to amputation if needed but is hoping to avoid.  Lori [name] shares with me that she has been on dialysis for ~25 years. She has had many issues with wounds previously that have eventually healed. She has a loving husband and a daughter and granddaughter. They are the joy of her life as she was told she would never have children. She is with her daughter everyday. She shares that she has had conversations with her family about her wishes and they have very clear understanding of what she would want - "my husband has my back!" She also shares that her daughter has been part of these  conversations as well. At this time she desires full aggressive care.   All questions/concerns addressed. Emotional support provided. Emotional support provided.   Primary Decision Maker PATIENT    SUMMARY OF RECOMMENDATIONS   - Full aggressive care  Code Status/Advance Care Planning:  Full code   Symptom Management:   Pain: As needed fentanyl and Vicodin controlling pain well currently. No changes.   Palliative Prophylaxis:   Bowel Regimen, Delirium Protocol, Frequent Pain Assessment and Turn Reposition  Additional Recommendations (Limitations, Scope, Preferences):  Full Scope Treatment  Prognosis:   Unable to determine  Discharge Planning: To Be Determined      Primary Diagnoses: Present on Admission: . Atrial fibrillation ([place]) . Hyperlipidemia, unspecified   I have reviewed the medical record, interviewed the patient and family, and examined the patient. The following aspects are pertinent.  Past Medical History:  Diagnosis Date  . Anemia   . ASCVD (arteriosclerotic cardiovascular disease)    a. s/p prior stenting of LAD b. NSTEMI in 01/2018 requiring DES x3 to RCA given spiral dissection from ostium to mid-RCA but residual disease along distal RCA, LAD and 2nd Mrg  . Calciphylaxis   .  Chronic abdominal wound infection   . Dialysis patient ([place])   . 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 peitonitis; difficult vascular access  . GERD (gastroesophageal reflux disease)   . Hashimoto thyroiditis   . Hyperparathyroidism   . Hypertension   . Hypothyroidism   . Medically noncompliant   . Morbid obesity ([place])    Social History   Socioeconomic History  . Marital status: Married    Spouse name: Not on file  . Number of children: Not on file  . Years of education: Not on file  . Highest education level: Not on file  Occupational History  . Not on file  Tobacco Use  . Smoking status:  Former Research scientist (life sciences)  . Smokeless tobacco: Never Used  Substance and Sexual Activity  . Alcohol use: No  . Drug use: No  . Sexual activity: Yes    Birth control/protection: Surgical  Other Topics Concern  . Not on file  Social History Narrative   Married   No regular exercise   Social Determinants of Health   Financial Resource Strain: Not on file  Food Insecurity: Not on file  Transportation Needs: Not on file  Physical Activity: Not on file  Stress: Not on file  Social Connections: Not on file   Family History  Problem Relation Age of Onset  . Coronary artery disease Mother   . Kidney disease Father   . Diabetes Sister    Scheduled Meds: . aspirin EC  81 mg Oral Daily  . Chlorhexidine Gluconate Cloth  6 each Topical Daily  . collagenase   Topical Daily  . heparin  5,000 Units Subcutaneous Q8H  . levothyroxine  300 mcg Oral QHS  . midodrine  10 mg Oral TID WC  . nystatin   Topical BID  . pantoprazole  40 mg Oral QHS  . sevelamer carbonate  3,200 mg Oral TID WC  . ticagrelor  90 mg Oral BID   Continuous Infusions: . ceFEPime (MAXIPIME) IV    . metronidazole 500 mg (12/16/20 0701)  . remdesivir 100 mg in NS 100 mL 100 mg (12/16/20 0824)  . vancomycin     PRN Meds:.fentaNYL (SUBLIMAZE) injection, HYDROcodone-acetaminophen, perflutren lipid microspheres (DEFINITY) IV suspension, sevelamer carbonate Allergies  Allergen Reactions  . Activase [Alteplase] Shortness Of Breath  . Bee Pollen Anaphylaxis  . Warfarin Sodium Nausea And Vomiting and Rash   Review of Systems  Constitutional: Positive for activity change and fatigue.  Respiratory: Negative for shortness of breath.   Skin: Positive for wound.  Neurological: Positive for weakness.    Physical Exam Vitals and nursing note reviewed.  Constitutional:      General: She is not in acute distress.    Appearance: She is ill-appearing.  Cardiovascular:     Rate and Rhythm: Normal rate.  Pulmonary:     Effort:  Pulmonary effort is normal. No tachypnea, accessory muscle usage or respiratory distress.  Abdominal:     Palpations: Abdomen is soft.  Feet:     Comments: L foot wound not assessed Neurological:     Mental Status: She is alert and oriented to person, place, and time.     Vital Signs: BP 129/68   Pulse 74   Temp 97.6 F (36.4 C) (Oral)   Resp 15   Ht 5' 3"  (1.6 m)   Wt 109.9 kg   SpO2 94%   BMI 42.92 kg/m  Pain Scale: 0-10  Pain Score: 4    SpO2: SpO2: 94 % O2 Device:SpO2: 94 % O2 Flow Rate: .O2 Flow Rate (L/min): 2 L/min  IO: Intake/output summary:   Intake/Output Summary (Last 24 hours) at 12/16/2020 0855 Last data filed at 12/16/2020 0400 Gross per 24 hour  Intake 500 ml  Output -  Net 500 ml    LBM: Last BM Date: 12/15/20 Baseline Weight: Weight: 100.6 kg Most recent weight: Weight: 109.9 kg     Palliative Assessment/Data:     Time In: 1110 Time Out: 1220 Time Total: 70 MIN Greater than 50%  of this time was spent counseling and coordinating care related to the above assessment and plan.  Signed by: Vinie Sill, NP Palliative Medicine Team Pager # [phone] (M-F 8a-5p) Team Phone # [phone] (Nights/Weekends)

## 2020-12-16 NOTE — Care Management Important Message (Signed)
Important Message  Patient Details  Name: Lori [name] MRN: [id number] Date of Birth: October 10, 1971   Medicare Important Message Given:  Yes - Important Message mailed due to current National Emergency     Tommy Medal 12/16/2020, 4:38 PM

## 2020-12-16 NOTE — Procedures (Signed)
   HEMODIALYSIS TREATMENT NOTE:  3.5 hour low-heparin HD completed via right chest wall TDC.  Exit site is unremarkable.  UF was interrupted and rate was decreased several times d/t hypotension despite Albumin and midodrine.  Net UF 1.4 liters.  All blood was returned.    Rockwell Alexandria, RN

## 2020-12-16 NOTE — Consult Note (Signed)
Reason for Consult: Left foot cellulitis Referring Physician: Dr. [name] is an 50 y.o. female.  HPI: Patient is a 50 year old white female with multiple medical problems including end-stage renal disease, coronary artery disease peripheral vascular disease, morbid obesity, and hypertension who presented to [facility] with generalized weakness and overall generalized malaise.  At her initial presentation, she was noted to be hypotensive and was started on sepsis protocol.  She was found on examination to have an ulceration of the left fourth/fifth toes.  Apparently this has been followed by podiatry.  She has an extensive vascular history requiring multiple grafts to be placed for dialysis.  In addition, she has had a graft placed in the left groin, but apparently that has clotted off.  I did start up preliminary work-up of her lower extremity vasculature.  ABIs could not be done due to upper extremity vascular access.  A duplex flow of the left leg revealed monophasic upper extremity vasculature with a cut off at the anterior tibial artery proximally.  No distal blood flow was noted on duplex ultrasound.  Patient states she has had a recent history of left foot pain requiring ongoing pain medication.  Past Medical History:  Diagnosis Date  . Anemia   . ASCVD (arteriosclerotic cardiovascular disease)    a. s/p prior stenting of LAD b. NSTEMI in 01/2018 requiring DES x3 to RCA given spiral dissection from ostium to mid-RCA but residual disease along distal RCA, LAD and 2nd Mrg  . Calciphylaxis   . Chronic abdominal wound infection   . Dialysis patient ([place])   . ESRD (end stage renal disease) ([place])    Due to membranous GN dialysis 09/1996; peritoneal dialysis --? peitonitis; difficult vascular access  . GERD (gastroesophageal reflux disease)   . Hashimoto thyroiditis   . Hyperparathyroidism   . Hypertension   . Hypothyroidism   . Medically noncompliant   . Morbid obesity  ([place])     Past Surgical History:  Procedure Laterality Date  . [place]  . CORONARY STENT INTERVENTION N/A 10/21/2019   Procedure: CORONARY STENT INTERVENTION;  Surgeon: Wellington Hampshire, MD;  Location: [place] [facility];  Service: Cardiovascular;  Laterality: N/A;  . LEFT HEART CATH AND CORONARY ANGIOGRAPHY N/A 02/11/2018   Procedure: LEFT HEART CATH AND CORONARY ANGIOGRAPHY;  Surgeon: Charolette Forward, MD;  Location: [place] [facility];  Service: Cardiovascular;  Laterality: N/A;  . LEFT HEART CATH AND CORONARY ANGIOGRAPHY N/A 10/14/2019   Procedure: LEFT HEART CATH AND CORONARY ANGIOGRAPHY;  Surgeon: Wellington Hampshire, MD;  Location: [place] [facility];  Service: Cardiovascular;  Laterality: N/A;  . THYROIDECTOMY, PARTIAL     Resectin of left lobe with reimplantation in the forearm, small focus of papillary carcinoma incidentally noted at pathology - 2000 and Hashimoto's thyrdoiditis in 2001  . TONSILLECTOMY AND ADENOIDECTOMY      Family History  Problem Relation Age of Onset  . Coronary artery disease Mother   . Kidney disease Father   . Diabetes Sister     Social History:  reports that she has quit smoking. She has never used smokeless tobacco. She reports that she does not drink alcohol and does not use drugs.  Allergies:  Allergies  Allergen Reactions  . Activase [Alteplase] Shortness Of Breath  . Bee Pollen Anaphylaxis  . Warfarin Sodium Nausea And Vomiting and Rash    Medications: I have reviewed the patient's current medications.  Results for orders placed or performed  during the hospital encounter of 12/14/20 (from the past 48 hour(s))  Blood culture (routine x 2)     Status: None (Preliminary result)   Collection Time: 12/14/20  2:18 PM   Specimen: Right Antecubital; Blood  Result Value Ref Range   Specimen Description RIGHT ANTECUBITAL    Special Requests      BOTTLES DRAWN AEROBIC AND ANAEROBIC Blood Culture results may not be optimal due to an inadequate  volume of blood received in culture bottles   Culture      NO GROWTH 2 DAYS Performed at [facility], [address], [place], [place] [address]    Report Status PENDING   Lactic acid, plasma     Status: Abnormal   Collection Time: 12/14/20  4:18 PM  Result Value Ref Range   Lactic Acid, Venous 2.0 (HH) 0.5 - 1.9 mmol/L    Comment: CRITICAL RESULT CALLED TO, READ BACK BY AND VERIFIED WITH: A HARRY,RN'@2135'$  12/14/20 MKELLY Performed at [facility], [address], [place], [place] [address]   CBC with Differential     Status: Abnormal   Collection Time: 12/14/20  5:15 PM  Result Value Ref Range   WBC 20.6 (H) 4.0 - 10.5 K/uL   RBC 3.53 (L) 3.87 - 5.11 MIL/uL   Hemoglobin 11.4 (L) 12.0 - 15.0 g/dL   HCT 35.1 (L) 36.0 - 46.0 %   MCV 99.4 80.0 - 100.0 fL   MCH 32.3 26.0 - 34.0 pg   MCHC 32.5 30.0 - 36.0 g/dL   RDW 17.0 (H) 11.5 - 15.5 %   Platelets 167 150 - 400 K/uL   nRBC 0.0 0.0 - 0.2 %   Neutrophils Relative % 82 %   Neutro Abs 16.9 (H) 1.7 - 7.7 K/uL   Lymphocytes Relative 5 %   Lymphs Abs 0.9 0.7 - 4.0 K/uL   Monocytes Relative 9 %   Monocytes Absolute 1.9 (H) 0.1 - 1.0 K/uL   Eosinophils Relative 1 %   Eosinophils Absolute 0.3 0.0 - 0.5 K/uL   Basophils Relative 1 %   Basophils Absolute 0.1 0.0 - 0.1 K/uL   Immature Granulocytes 2 %   Abs Immature Granulocytes 0.38 (H) 0.00 - 0.07 K/uL    Comment: Performed at [facility], [address] [name], [place] [address]  Comprehensive metabolic panel     Status: Abnormal   Collection Time: 12/14/20  5:15 PM  Result Value Ref Range   Sodium 132 (L) 135 - 145 mmol/L   Potassium 5.1 3.5 - 5.1 mmol/L    Comment: SLIGHT HEMOLYSIS   Chloride 94 (L) 98 - 111 mmol/L   CO2 21 (L) 22 - 32 mmol/L   Glucose, Bld 80 70 - 99 mg/dL    Comment: Glucose reference range applies only to samples taken after fasting for at least 8 hours.   BUN 40 (H) 6 - 20 mg/dL   Creatinine, Ser 9.68 (H) 0.44 - 1.00 mg/dL   Calcium 8.2 (L) 8.9 - 10.3  mg/dL   Total Protein 7.1 6.5 - 8.1 g/dL   Albumin 3.5 3.5 - 5.0 g/dL   AST 65 (H) 15 - 41 U/L   ALT 27 0 - 44 U/L   Alkaline Phosphatase 50 38 - 126 U/L   Total Bilirubin 0.6 0.3 - 1.2 mg/dL   GFR, Estimated 5 (L) >60 mL/min    Comment: (NOTE) Calculated using the CKD-EPI Creatinine Equation (2021)    Anion gap 17 (H) 5 - 15  Comment: Performed at [facility], [address], [place], [place] [address]  Lactic acid, plasma     Status: None   Collection Time: 12/14/20  5:15 PM  Result Value Ref Range   Lactic Acid, Venous 1.8 0.5 - 1.9 mmol/L    Comment: Performed at [facility], [address], [place], [place] [address]  Resp Panel by RT-PCR (Flu A&B, Covid) Nasopharyngeal Swab     Status: Abnormal   Collection Time: 12/14/20  7:21 PM   Specimen: Nasopharyngeal Swab; Nasopharyngeal(NP) swabs in vial transport medium  Result Value Ref Range   SARS Coronavirus 2 by RT PCR POSITIVE (A) NEGATIVE    Comment: RESULT CALLED TO, READ BACK BY AND VERIFIED WITH: HARRY,A @ 2016 ON 12/14/20 BY JUW (NOTE) SARS-CoV-2 target nucleic acids are DETECTED.  The SARS-CoV-2 RNA is generally detectable in upper respiratory specimens during the acute phase of infection. Positive results are indicative of the presence of the identified virus, but do not rule out bacterial infection or co-infection with other pathogens not detected by the test. Clinical correlation with patient history and other diagnostic information is necessary to determine patient infection status. The expected result is Negative.  Fact Sheet for Patients: EntrepreneurPulse.com.au  Fact Sheet for Healthcare Providers: IncredibleEmployment.be  This test is not yet approved or cleared by the [place] FDA and  has been authorized for detection and/or diagnosis of SARS-CoV-2 by FDA under an Emergency Use Authorization (EUA).  This EUA will remain in effect (meaning this test can be   used) for the duration of  the COVID-19 declaration under Section 564(b)(1) of the Act, 21 U.S.C. section 360bbb-3(b)(1), unless the authorization is terminated or revoked sooner.     Influenza A by PCR NEGATIVE NEGATIVE   Influenza B by PCR NEGATIVE NEGATIVE    Comment: (NOTE) The Xpert Xpress SARS-CoV-2/FLU/RSV plus assay is intended as an aid in the diagnosis of influenza from Nasopharyngeal swab specimens and should not be used as a sole basis for treatment. Nasal washings and aspirates are unacceptable for Xpert Xpress SARS-CoV-2/FLU/RSV testing.  Fact Sheet for Patients: EntrepreneurPulse.com.au  Fact Sheet for Healthcare Providers: IncredibleEmployment.be  This test is not yet approved or cleared by the [place] FDA and has been authorized for detection and/or diagnosis of SARS-CoV-2 by FDA under an Emergency Use Authorization (EUA). This EUA will remain in effect (meaning this test can be used) for the duration of the COVID-19 declaration under Section 564(b)(1) of the Act, 21 U.S.C. section 360bbb-3(b)(1), unless the authorization is terminated or revoked.  Performed at [facility], [address] [place], [place] [address]   Blood culture (routine x 2)     Status: None (Preliminary result)   Collection Time: 12/14/20  9:25 PM   Specimen: Neck; Blood  Result Value Ref Range   Specimen Description NECK    Special Requests      BOTTLES DRAWN AEROBIC AND ANAEROBIC Blood Culture adequate volume   Culture      NO GROWTH 2 DAYS Performed at [facility], [address] [place], [place] [address]    Report Status PENDING   Basic metabolic panel     Status: Abnormal   Collection Time: 12/15/20  6:13 AM  Result Value Ref Range   Sodium 130 (L) 135 - 145 mmol/L   Potassium 4.8 3.5 - 5.1 mmol/L   Chloride 92 (L) 98 - 111 mmol/L   CO2 21 (L) 22 - 32 mmol/L   Glucose, Bld 86 70 -  99 mg/dL    Comment: Glucose reference range  applies only to samples taken after fasting for at least 8 hours.   BUN 43 (H) 6 - 20 mg/dL   Creatinine, Ser 9.98 (H) 0.44 - 1.00 mg/dL   Calcium 8.0 (L) 8.9 - 10.3 mg/dL   GFR, Estimated 4 (L) >60 mL/min    Comment: (NOTE) Calculated using the CKD-EPI Creatinine Equation (2021)    Anion gap 17 (H) 5 - 15    Comment: Performed at [facility], [address] [place], [place] [address]  CBC     Status: Abnormal   Collection Time: 12/15/20  6:13 AM  Result Value Ref Range   WBC 17.2 (H) 4.0 - 10.5 K/uL   RBC 3.45 (L) 3.87 - 5.11 MIL/uL   Hemoglobin 11.0 (L) 12.0 - 15.0 g/dL   HCT 34.3 (L) 36.0 - 46.0 %   MCV 99.4 80.0 - 100.0 fL   MCH 31.9 26.0 - 34.0 pg   MCHC 32.1 30.0 - 36.0 g/dL   RDW 16.8 (H) 11.5 - 15.5 %   Platelets 152 150 - 400 K/uL   nRBC 0.0 0.0 - 0.2 %    Comment: Performed at [facility], [address], [place], [place] [address]  HIV Antibody (routine testing w rflx)     Status: None   Collection Time: 12/15/20  6:13 AM  Result Value Ref Range   HIV Screen 4th Generation wRfx Non Reactive Non Reactive    Comment: Performed at [facility], [place] [address] [place], [place] [address]  Renal function panel     Status: Abnormal   Collection Time: 12/16/20  4:17 AM  Result Value Ref Range   Sodium 130 (L) 135 - 145 mmol/L   Potassium 4.9 3.5 - 5.1 mmol/L   Chloride 93 (L) 98 - 111 mmol/L   CO2 18 (L) 22 - 32 mmol/L   Glucose, Bld 79 70 - 99 mg/dL    Comment: Glucose reference range applies only to samples taken after fasting for at least 8 hours.   BUN 53 (H) 6 - 20 mg/dL   Creatinine, Ser 11.27 (H) 0.44 - 1.00 mg/dL   Calcium 7.9 (L) 8.9 - 10.3 mg/dL   Phosphorus 7.4 (H) 2.5 - 4.6 mg/dL   Albumin 3.0 (L) 3.5 - 5.0 g/dL   GFR, Estimated 4 (L) >60 mL/min    Comment: (NOTE) Calculated using the CKD-EPI Creatinine Equation (2021)    Anion gap 19 (H) 5 - 15    Comment: Performed at [facility], [address] [place], [place] [address]  CBC     Status:  Abnormal   Collection Time: 12/16/20  4:17 AM  Result Value Ref Range   WBC 15.4 (H) 4.0 - 10.5 K/uL   RBC 3.29 (L) 3.87 - 5.11 MIL/uL   Hemoglobin 10.6 (L) 12.0 - 15.0 g/dL   HCT 32.9 (L) 36.0 - 46.0 %   MCV 100.0 80.0 - 100.0 fL   MCH 32.2 26.0 - 34.0 pg   MCHC 32.2 30.0 - 36.0 g/dL   RDW 16.8 (H) 11.5 - 15.5 %   Platelets 150 150 - 400 K/uL   nRBC 0.0 0.0 - 0.2 %    Comment: Performed at [facility], [address] [place],  [address]    CT ABDOMEN PELVIS WO CONTRAST  Result Date: 12/14/2020 CLINICAL DATA:  Generalized abdominal pain following fall, initial encounter EXAM: CT ABDOMEN AND PELVIS WITHOUT CONTRAST TECHNIQUE: Multidetector  CT imaging of the abdomen and pelvis was performed following the standard protocol without IV contrast. COMPARISON:  05/03/2013 FINDINGS: Lower chest: No acute abnormality. Heavy coronary calcifications are noted. Hepatobiliary: Tiny dependent gallstones are noted. The gallbladder is otherwise within normal limits. Liver is unremarkable. Pancreas: Unremarkable. No pancreatic ductal dilatation or surrounding inflammatory changes. Spleen: Normal in size without focal abnormality. Adrenals/Urinary Tract: Adrenal glands are within normal limits. Kidneys demonstrate significant cortical thinning with multiple cysts the largest of which lies in the lower pole of the right kidney measuring 5.8 cm in greatest dimension. This has increased in size from the prior exam at which time it measured 4.6 cm. No obstructive changes are seen. No definitive renal calculi are noted. The bladder is decompressed. These changes are consistent with the patient's known history of end-stage renal failure. Stomach/Bowel: Colon is well visualized without obstructive or inflammatory changes. The appendix is within normal limits. No small bowel or gastric abnormality is noted. Vascular/Lymphatic: Aortic atherosclerosis. No enlarged abdominal or pelvic lymph nodes. Left retroaortic renal  vein is noted. Multiple collaterals are noted in the anterior abdominal wall likely related to central venous stenosis. These are stable from the prior exam. Calcified graft in the left thigh proximally is noted. Reproductive: Uterus and bilateral adnexa are unremarkable. Other: No abdominal wall hernia or abnormality. No abdominopelvic ascites. Musculoskeletal: No acute or significant osseous findings. IMPRESSION: Cholelithiasis without complicating factors. Changes consistent with end-stage renal failure with multiple renal cysts bilaterally and diffuse cortical thinning. Aortic atherosclerotic changes. Multiple venous collaterals in the anterior abdominal wall consistent with central venous stenosis likely related to the known dialysis. No acute abnormality related to the recent injury is noted. Electronically Signed   By: Inez Catalina M.D.   On: 12/14/2020 17:10   DG Chest 2 View  Result Date: 12/14/2020 CLINICAL DATA:  Pain following fall EXAM: CHEST - 2 VIEW COMPARISON:  October 12, 2019 chest radiograph and chest CT October 16, 2019 FINDINGS: Central catheter tip is in the superior vena cava. No pneumothorax. There is no edema or airspace opacity. Heart is mildly enlarged with pulmonary vascularity normal. No adenopathy. No bone lesions. Postoperative change in the thyroid region noted. IMPRESSION: Central catheter as described without pneumothorax. Lungs clear. Heart mildly enlarged. Electronically Signed   By: Lowella Grip III M.D.   On: 12/14/2020 15:16   CT Head Wo Contrast  Result Date: 12/14/2020 CLINICAL DATA:  Fall.  Hit head. EXAM: CT HEAD WITHOUT CONTRAST CT CERVICAL SPINE WITHOUT CONTRAST TECHNIQUE: Multidetector CT imaging of the head and cervical spine was performed following the standard protocol without intravenous contrast. Multiplanar CT image reconstructions of the cervical spine were also generated. COMPARISON:  CT head dated June 15, 2010. FINDINGS: CT HEAD FINDINGS Brain:  No evidence of acute infarction, hemorrhage, hydrocephalus, extra-axial collection or mass lesion/mass effect. Tiny old lacunar infarct in the right basal ganglia. Vascular: Calcified atherosclerosis at the skullbase. No hyperdense vessel. Skull: Thickened skull with diffuse sclerosis and patchy sclerotic foci, consistent with renal osteodystrophy. No fracture. Sinuses/Orbits: No acute finding. Other: None. CT CERVICAL SPINE FINDINGS Alignment: Mild reversal of the normal cervical lordosis. No traumatic malalignment. Skull base and vertebrae: No acute fracture. No primary bone lesion or focal pathologic process. Soft tissues and spinal canal: No prevertebral fluid or swelling. No visible canal hematoma. Disc levels: Mild disc height loss at C6-C7. Mild right uncovertebral hypertrophy at C2-C3 and C3-C4. Upper chest: Negative. Other: Prior thyroidectomy. IMPRESSION: 1. No acute intracranial abnormality.  Old right basal ganglia lacunar infarct. 2. No acute cervical spine fracture or traumatic listhesis. 3. Renal osteodystrophy. Electronically Signed   By: Titus Dubin M.D.   On: 12/14/2020 15:15   CT Cervical Spine Wo Contrast  Result Date: 12/14/2020 CLINICAL DATA:  Fall.  Hit head. EXAM: CT HEAD WITHOUT CONTRAST CT CERVICAL SPINE WITHOUT CONTRAST TECHNIQUE: Multidetector CT imaging of the head and cervical spine was performed following the standard protocol without intravenous contrast. Multiplanar CT image reconstructions of the cervical spine were also generated. COMPARISON:  CT head dated June 15, 2010. FINDINGS: CT HEAD FINDINGS Brain: No evidence of acute infarction, hemorrhage, hydrocephalus, extra-axial collection or mass lesion/mass effect. Tiny old lacunar infarct in the right basal ganglia. Vascular: Calcified atherosclerosis at the skullbase. No hyperdense vessel. Skull: Thickened skull with diffuse sclerosis and patchy sclerotic foci, consistent with renal osteodystrophy. No fracture.  Sinuses/Orbits: No acute finding. Other: None. CT CERVICAL SPINE FINDINGS Alignment: Mild reversal of the normal cervical lordosis. No traumatic malalignment. Skull base and vertebrae: No acute fracture. No primary bone lesion or focal pathologic process. Soft tissues and spinal canal: No prevertebral fluid or swelling. No visible canal hematoma. Disc levels: Mild disc height loss at C6-C7. Mild right uncovertebral hypertrophy at C2-C3 and C3-C4. Upper chest: Negative. Other: Prior thyroidectomy. IMPRESSION: 1. No acute intracranial abnormality. Old right basal ganglia lacunar infarct. 2. No acute cervical spine fracture or traumatic listhesis. 3. Renal osteodystrophy. Electronically Signed   By: Titus Dubin M.D.   On: 12/14/2020 15:15   US ARTERIAL ABI (SCREENING LOWER EXTREMITY)  Result Date: 12/15/2020 CLINICAL DATA:  50 year old female with a history cellulitis of the fourth EXAM: NONINVASIVE PHYSIOLOGIC VASCULAR STUDY OF BILATERAL LOWER EXTREMITIES TECHNIQUE: Evaluation of both lower extremities was performed at rest, including calculation of ankle-brachial indices, multiple segmental pressure evaluation, segmental Doppler and segmental pulse volume recording. COMPARISON:  None. FINDINGS: Right ABI:  Not acquired Left ABI:  Not acquired Right Lower Extremity: Segmental Doppler at the right ankle demonstrates no signal of the posterior tibial artery and monophasic dorsalis pedis. Left Lower Extremity: No signal identified within the posterior tibial artery or dorsalis pedis on the left. IMPRESSION: Resting ABI the bilateral lower extremities was not performed. Monophasic waveform of the dorsalis pedis on the right with no signal of the posterior tibial artery. On the left no signal was identified within the tibial arteries at the ankle. Signed, Dulcy Fanny. Dellia Nims, RPVI [facility] Radiology Electronically Signed   By: Corrie Mckusick D.O.   On: 12/15/2020  11:35   US ARTERIAL LOWER EXTREMITY DUPLEX LEFT (NON-ABI)  Result Date: 12/15/2020 CLINICAL DATA:  50 year old female with a left foot wound EXAM: NONINVASIVE PHYSIOLOGIC VASCULAR STUDY OF UNILATERAL LOWER EXTREMITIES TECHNIQUE: Evaluation of left lower extremities was performed at rest, including directed duplex. COMPARISON:  None. FINDINGS: Left ABI: Not acquired Left Lower Extremity: Directed duplex left lower extremity demonstrates monophasic waveform of the common femoral artery, superficial femoral artery, popliteal artery. Monophasic anterior tibial artery proximally. No signal distally. No signal of the distal peroneal artery or posterior tibial artery. IMPRESSION: Directed duplex demonstrates monophasic waveform of the common femoral artery, profunda femoris, SFA, popliteal artery. There is no signal identified within the distal anterior tibial artery, posterior tibial artery, peroneal artery. Signed, Dulcy Fanny. Dellia Nims, RPVI [facility] Radiology Electronically Signed   By: Corrie Mckusick D.O.   On: 12/15/2020 15:59   DG Foot Complete Left  Result Date: 12/14/2020 CLINICAL DATA:  Wound on foot near fourth and fifth MTP EXAM: LEFT FOOT - COMPLETE 3+ VIEW COMPARISON:  None. FINDINGS: No fracture or malalignment. Vascular calcifications. No soft tissue emphysema or radiopaque foreign body. IMPRESSION: No acute osseous abnormality. Electronically Signed   By: Donavan Foil M.D.   On: 12/14/2020 15:18   ECHOCARDIOGRAM COMPLETE  Result Date: 12/16/2020    ECHOCARDIOGRAM REPORT   Patient Name:   Lori [name] Date of Exam: 12/16/2020 Medical Rec #:  [id number]    Height:       63.0 in Accession #:    [id number]   Weight:       242.3 lb Date of Birth:  1971-10-27    BSA:          2.098 m Patient Age:    58 years     BP:           129/68 mmHg Patient Gender: F            HR:           74 bpm. Exam Location:  [name] Procedure: 2D Echo Indications:     Abnormal ECG R94.31  History:        Patient has prior history of Echocardiogram examinations, most                 recent 10/13/2019. TIA, Arrythmias:Atrial Fibrillation,                 Signs/Symptoms:Syncope and Chest Pain; Risk Factors:Dyslipidemia                 and Former Smoker. ESRD.  Sonographer:    Leavy Cella RDCS (AE) Referring Phys: [place]  1. Left ventricular ejection fraction, by estimation, is 55 to 60%. The left ventricle has normal function. Left ventricular endocardial border not optimally defined to evaluate regional wall motion. There is moderate left ventricular hypertrophy. Left ventricular diastolic parameters are consistent with Grade II diastolic dysfunction (pseudonormalization).  2. Right ventricular systolic function is normal. The right ventricular size is normal. Tricuspid regurgitation signal is inadequate for assessing PA pressure.  3. Left atrial size was severely dilated.  4. The mitral valve is abnormal. Trivial mitral valve regurgitation. Moderate to severe mitral annular calcification.  5. The aortic valve is tricuspid. There is moderate calcification of the aortic valve. Aortic valve regurgitation is not visualized. Mild to moderate aortic valve sclerosis/calcification is present, without any evidence of aortic stenosis. Aortic valve mean gradient measures 8.3 mmHg. Aortic valve Vmax measures 2.02 m/s.  6. The inferior vena cava is normal in size with greater than 50% respiratory variability, suggesting right atrial pressure of 3 mmHg. FINDINGS  Left Ventricle: Left ventricular ejection fraction, by estimation, is 55 to 60%. The left ventricle has normal function. Left ventricular endocardial border not optimally defined to evaluate regional wall motion. The left ventricular internal cavity size was small. There is moderate left ventricular hypertrophy. Left ventricular diastolic parameters are consistent with Grade II diastolic dysfunction  (pseudonormalization). Right Ventricle: The right ventricular size is normal. No increase in right ventricular wall thickness. Right ventricular systolic function is normal. Tricuspid regurgitation signal is inadequate for assessing PA pressure. Left Atrium: Left atrial size was severely dilated. Right Atrium: Right atrial size was normal in size. Pericardium: There is no evidence of pericardial effusion. Presence of pericardial fat pad. Mitral Valve: The mitral valve is abnormal. There is mild thickening of the  mitral valve leaflet(s). There is mild calcification of the mitral valve leaflet(s). Moderate to severe mitral annular calcification. Trivial mitral valve regurgitation. Tricuspid Valve: The tricuspid valve is grossly normal. Tricuspid valve regurgitation is trivial. Aortic Valve: The aortic valve is tricuspid. There is moderate calcification of the aortic valve. There is moderate aortic valve annular calcification. Aortic valve regurgitation is not visualized. Mild to moderate aortic valve sclerosis/calcification is  present, without any evidence of aortic stenosis. Aortic valve mean gradient measures 8.3 mmHg. Aortic valve peak gradient measures 16.3 mmHg. Aortic valve area, by VTI measures 2.51 cm. Pulmonic Valve: The pulmonic valve was not well visualized. Pulmonic valve regurgitation is not visualized. Aorta: The aortic root is normal in size and structure. Venous: The inferior vena cava is normal in size with greater than 50% respiratory variability, suggesting right atrial pressure of 3 mmHg. IAS/Shunts: No atrial level shunt detected by color flow Doppler.  LEFT VENTRICLE PLAX 2D LVIDd:         2.61 cm  Diastology LVIDs:         1.80 cm  LV e' medial:    4.20 cm/s LV PW:         1.59 cm  LV E/e' medial:  29.0 LV IVS:        1.52 cm  LV e' lateral:   3.00 cm/s LVOT diam:     2.10 cm  LV E/e' lateral: 40.7 LV SV:         103 LV SV Index:   49 LVOT Area:     3.46 cm  RIGHT VENTRICLE RV S prime:      9.49 cm/s TAPSE (M-mode): 1.7 cm LEFT ATRIUM              Index       RIGHT ATRIUM           Index LA diam:        3.60 cm  1.72 cm/m  RA Area:     10.80 cm LA Vol (A2C):   44.6 ml  21.26 ml/m RA Volume:   21.80 ml  10.39 ml/m LA Vol (A4C):   108.0 ml 51.48 ml/m LA Biplane Vol: 73.1 ml  34.84 ml/m  AORTIC VALVE AV Area (Vmax):    2.31 cm AV Area (Vmean):   2.63 cm AV Area (VTI):     2.51 cm AV Vmax:           201.67 cm/s AV Vmean:          130.333 cm/s AV VTI:            0.410 m AV Peak Grad:      16.3 mmHg AV Mean Grad:      8.3 mmHg LVOT Vmax:         134.33 cm/s LVOT Vmean:        99.033 cm/s LVOT VTI:          0.297 m LVOT/AV VTI ratio: 0.72  AORTA Ao Root diam: 2.90 cm MITRAL VALVE MV Area (PHT): 2.01 cm     SHUNTS MV Decel Time: 378 msec     Systemic VTI:  0.30 m MV E velocity: 122.00 cm/s  Systemic Diam: 2.10 cm MV A velocity: 49.60 cm/s MV E/A ratio:  2.46 Rozann Lesches MD Electronically signed by Rozann Lesches MD Signature Date/Time: 12/16/2020/11:20:42 AM    Final    DG Hips Bilat W or Wo Pelvis 3-4 Views  Result Date: 12/14/2020 CLINICAL DATA:  Fall,  initial encounter. EXAM: DG HIP (WITH OR WITHOUT PELVIS) 3-4V BILAT COMPARISON:  None. FINDINGS: Image quality is degraded by body habitus. No acute osseous or joint abnormality. Vascular stent is seen in the left femoral vasculature. IMPRESSION: Image quality is degraded by body habitus. No definite acute osseous or joint abnormality. Electronically Signed   By: Lorin Picket M.D.   On: 12/14/2020 15:17    ROS:  Pertinent items are noted in HPI.  Blood pressure 129/68, pulse (!) 57, temperature 97.7 F (36.5 C), temperature source Oral, resp. rate 13, height '5\' 3"'$  (1.6 m), weight 109.9 kg, SpO2 100 %. Physical Exam: Pleasant white female no acute distress+ Extremity examination reveals multiple surgical scars present in the left groin.  I could not distinctly palpate a left femoral pulse.  She does have cyanotic looking toes in  the left foot with coolness to palpation below the knee down to the foot.  A dried eschar is noted between the fourth and fifth toes with minimal drainage present.  I could not palpate pedal pulses.  No frank gangrenous changes are present in the lower extremity except for the dry eschar.  Vascular studies reviewed  Assessment/Plan: Impression: Sepsis of unknown etiology.  Patient's leukocytosis is resolving on antibiotics.  Her leukocytosis could be multifactorial in nature.  I am concerned that she does have a low flow state to the left leg.  2D echo is pending.  She has monophasic blood flow into the left leg.  I am concerned that she ultimately may need a left above-the-knee amputation.  This was discussed with the patient.  She states that her vascular surgeon is at [facility].  She would like to be seen by him before any surgery is considered.  This was discussed with Dr. Wynetta Emery.  He will contact Dr. Delana Meyer.  No need for acute surgical intervention at this time.  Aviva Signs 12/16/2020, 11:44 AM

## 2020-12-16 NOTE — Progress Notes (Signed)
Subjective:  Visualized thru the window in the ICU-- BP seems better this AM- on [place] 02 Objective Vital signs in last 24 hours: Vitals:   12/16/20 0500 12/16/20 0700 12/16/20 0800 12/16/20 0839  BP:  (!) 154/71 129/68   Pulse:  (!) 59 74   Resp:  12 15   Temp:  98 F (36.7 C)  97.6 F (36.4 C)  TempSrc:    Oral  SpO2:  99% 94%   Weight: 109.9 kg     Height:       Weight change: 8.9 kg  Intake/Output Summary (Last 24 hours) at 12/16/2020 0859 Last data filed at 12/16/2020 0400 Gross per 24 hour  Intake 500 ml  Output --  Net 500 ml   Dialyzes at [facility]- MWF- 4 hours  EDW 101.5. HD Bath 2/2.5, Dialyzer 180, Heparin yes- 10,000. Access TDC. Calcitriol 0.75 TIW 1/17 hgb 11.3, last calc 10, last K 5.0, alb 4.2, phos 9.9, pth 404   Assessment/Plan: 50 year old but chronically ill white female with medical issues including ESRD being admitted for leukocytosis, hypotension and COVID. 1 sepsis-scenario seeming to be consistent with sepsis due to COVID or other-possibility of bacteremia from [facility].  Has been started on Maxipime, Flagyl, remdesivir, and vanc.  Blood cultures pending-  WBC improving as is BP 2 ESRD: Normally a MWF dialysis patient.  Received over 2 hours of dialysis yesterday 1/19.  Will plan for gentle HD today to keep on schedule  3 Hypertension: Now is hypotensive.  Hemodynamics have improved overnight  4. Anemia of ESRD: Not a major issue at this point-no ESA needed 5. Metabolic Bone Disease: We will continue home calcitriol and renvela  Patient will not be physically seen over the weekend- plan for dialysis is today and then again on Monday-  Call with concerns over the weekend-  Pt will be seen again on Monday    Kieffer Blatz A Makinna Andy    Labs: Basic Metabolic Panel: Recent Labs  Lab 12/14/20 1715 12/15/20 0613 12/16/20 0417  NA 132* 130* 130*  K 5.1 4.8 4.9  CL 94* 92* 93*  CO2 21* 21* 18*  GLUCOSE 80 86 79  BUN 40* 43* 53*  CREATININE 9.68* 9.98* 11.27*   CALCIUM 8.2* 8.0* 7.9*  PHOS  --   --  7.4*   Liver Function Tests: Recent Labs  Lab 12/14/20 1715 12/16/20 0417  AST 65*  --   ALT 27  --   ALKPHOS 50  --   BILITOT 0.6  --   PROT 7.1  --   ALBUMIN 3.5 3.0*   No results for input(s): LIPASE, AMYLASE in the last 168 hours. No results for input(s): AMMONIA in the last 168 hours. CBC: Recent Labs  Lab 12/14/20 1715 12/15/20 0613  WBC 20.6* 17.2*  NEUTROABS 16.9*  --   HGB 11.4* 11.0*  HCT 35.1* 34.3*  MCV 99.4 99.4  PLT 167 152   Cardiac Enzymes: No results for input(s): CKTOTAL, CKMB, CKMBINDEX, TROPONINI in the last 168 hours. CBG: No results for input(s): GLUCAP in the last 168 hours.  Iron Studies: No results for input(s): IRON, TIBC, TRANSFERRIN, FERRITIN in the last 72 hours. Studies/Results: CT ABDOMEN PELVIS WO CONTRAST  Result Date: 12/14/2020 CLINICAL DATA:  Generalized abdominal pain following fall, initial encounter EXAM: CT ABDOMEN AND PELVIS WITHOUT CONTRAST TECHNIQUE: Multidetector CT imaging of the abdomen and pelvis was performed following the standard protocol without IV contrast. COMPARISON:  05/03/2013 FINDINGS: Lower chest: No acute abnormality. Heavy  coronary calcifications are noted. Hepatobiliary: Tiny dependent gallstones are noted. The gallbladder is otherwise within normal limits. Liver is unremarkable. Pancreas: Unremarkable. No pancreatic ductal dilatation or surrounding inflammatory changes. Spleen: Normal in size without focal abnormality. Adrenals/Urinary Tract: Adrenal glands are within normal limits. Kidneys demonstrate significant cortical thinning with multiple cysts the largest of which lies in the lower pole of the right kidney measuring 5.8 cm in greatest dimension. This has increased in size from the prior exam at which time it measured 4.6 cm. No obstructive changes are seen. No definitive renal calculi are noted. The bladder is decompressed. These changes are consistent with the  patient's known history of end-stage renal failure. Stomach/Bowel: Colon is well visualized without obstructive or inflammatory changes. The appendix is within normal limits. No small bowel or gastric abnormality is noted. Vascular/Lymphatic: Aortic atherosclerosis. No enlarged abdominal or pelvic lymph nodes. Left retroaortic renal vein is noted. Multiple collaterals are noted in the anterior abdominal wall likely related to central venous stenosis. These are stable from the prior exam. Calcified graft in the left thigh proximally is noted. Reproductive: Uterus and bilateral adnexa are unremarkable. Other: No abdominal wall hernia or abnormality. No abdominopelvic ascites. Musculoskeletal: No acute or significant osseous findings. IMPRESSION: Cholelithiasis without complicating factors. Changes consistent with end-stage renal failure with multiple renal cysts bilaterally and diffuse cortical thinning. Aortic atherosclerotic changes. Multiple venous collaterals in the anterior abdominal wall consistent with central venous stenosis likely related to the known dialysis. No acute abnormality related to the recent injury is noted. Electronically Signed   By: Inez Catalina M.D.   On: 12/14/2020 17:10   DG Chest 2 View  Result Date: 12/14/2020 CLINICAL DATA:  Pain following fall EXAM: CHEST - 2 VIEW COMPARISON:  October 12, 2019 chest radiograph and chest CT October 16, 2019 FINDINGS: Central catheter tip is in the superior vena cava. No pneumothorax. There is no edema or airspace opacity. Heart is mildly enlarged with pulmonary vascularity normal. No adenopathy. No bone lesions. Postoperative change in the thyroid region noted. IMPRESSION: Central catheter as described without pneumothorax. Lungs clear. Heart mildly enlarged. Electronically Signed   By: Lowella Grip III M.D.   On: 12/14/2020 15:16   CT Head Wo Contrast  Result Date: 12/14/2020 CLINICAL DATA:  Fall.  Hit head. EXAM: CT HEAD WITHOUT CONTRAST  CT CERVICAL SPINE WITHOUT CONTRAST TECHNIQUE: Multidetector CT imaging of the head and cervical spine was performed following the standard protocol without intravenous contrast. Multiplanar CT image reconstructions of the cervical spine were also generated. COMPARISON:  CT head dated June 15, 2010. FINDINGS: CT HEAD FINDINGS Brain: No evidence of acute infarction, hemorrhage, hydrocephalus, extra-axial collection or mass lesion/mass effect. Tiny old lacunar infarct in the right basal ganglia. Vascular: Calcified atherosclerosis at the skullbase. No hyperdense vessel. Skull: Thickened skull with diffuse sclerosis and patchy sclerotic foci, consistent with renal osteodystrophy. No fracture. Sinuses/Orbits: No acute finding. Other: None. CT CERVICAL SPINE FINDINGS Alignment: Mild reversal of the normal cervical lordosis. No traumatic malalignment. Skull base and vertebrae: No acute fracture. No primary bone lesion or focal pathologic process. Soft tissues and spinal canal: No prevertebral fluid or swelling. No visible canal hematoma. Disc levels: Mild disc height loss at C6-C7. Mild right uncovertebral hypertrophy at C2-C3 and C3-C4. Upper chest: Negative. Other: Prior thyroidectomy. IMPRESSION: 1. No acute intracranial abnormality. Old right basal ganglia lacunar infarct. 2. No acute cervical spine fracture or traumatic listhesis. 3. Renal osteodystrophy. Electronically Signed   By: Titus Dubin  M.D.   On: 12/14/2020 15:15   CT Cervical Spine Wo Contrast  Result Date: 12/14/2020 CLINICAL DATA:  Fall.  Hit head. EXAM: CT HEAD WITHOUT CONTRAST CT CERVICAL SPINE WITHOUT CONTRAST TECHNIQUE: Multidetector CT imaging of the head and cervical spine was performed following the standard protocol without intravenous contrast. Multiplanar CT image reconstructions of the cervical spine were also generated. COMPARISON:  CT head dated June 15, 2010. FINDINGS: CT HEAD FINDINGS Brain: No evidence of acute infarction,  hemorrhage, hydrocephalus, extra-axial collection or mass lesion/mass effect. Tiny old lacunar infarct in the right basal ganglia. Vascular: Calcified atherosclerosis at the skullbase. No hyperdense vessel. Skull: Thickened skull with diffuse sclerosis and patchy sclerotic foci, consistent with renal osteodystrophy. No fracture. Sinuses/Orbits: No acute finding. Other: None. CT CERVICAL SPINE FINDINGS Alignment: Mild reversal of the normal cervical lordosis. No traumatic malalignment. Skull base and vertebrae: No acute fracture. No primary bone lesion or focal pathologic process. Soft tissues and spinal canal: No prevertebral fluid or swelling. No visible canal hematoma. Disc levels: Mild disc height loss at C6-C7. Mild right uncovertebral hypertrophy at C2-C3 and C3-C4. Upper chest: Negative. Other: Prior thyroidectomy. IMPRESSION: 1. No acute intracranial abnormality. Old right basal ganglia lacunar infarct. 2. No acute cervical spine fracture or traumatic listhesis. 3. Renal osteodystrophy. Electronically Signed   By: Titus Dubin M.D.   On: 12/14/2020 15:15   US ARTERIAL ABI (SCREENING LOWER EXTREMITY)  Result Date: 12/15/2020 CLINICAL DATA:  50 year old female with a history cellulitis of the fourth EXAM: NONINVASIVE PHYSIOLOGIC VASCULAR STUDY OF BILATERAL LOWER EXTREMITIES TECHNIQUE: Evaluation of both lower extremities was performed at rest, including calculation of ankle-brachial indices, multiple segmental pressure evaluation, segmental Doppler and segmental pulse volume recording. COMPARISON:  None. FINDINGS: Right ABI:  Not acquired Left ABI:  Not acquired Right Lower Extremity: Segmental Doppler at the right ankle demonstrates no signal of the posterior tibial artery and monophasic dorsalis pedis. Left Lower Extremity: No signal identified within the posterior tibial artery or dorsalis pedis on the left. IMPRESSION: Resting ABI the bilateral lower extremities was not performed. Monophasic waveform  of the dorsalis pedis on the right with no signal of the posterior tibial artery. On the left no signal was identified within the tibial arteries at the ankle. Signed, Dulcy Fanny. Dellia Nims, RPVI [facility] Electronically Signed   By: Corrie Mckusick D.O.   On: 12/15/2020 11:35   US ARTERIAL LOWER EXTREMITY DUPLEX LEFT (NON-ABI)  Result Date: 12/15/2020 CLINICAL DATA:  50 year old female with a left foot wound EXAM: NONINVASIVE PHYSIOLOGIC VASCULAR STUDY OF UNILATERAL LOWER EXTREMITIES TECHNIQUE: Evaluation of left lower extremities was performed at rest, including directed duplex. COMPARISON:  None. FINDINGS: Left ABI: Not acquired Left Lower Extremity: Directed duplex left lower extremity demonstrates monophasic waveform of the common femoral artery, superficial femoral artery, popliteal artery. Monophasic anterior tibial artery proximally. No signal distally. No signal of the distal peroneal artery or posterior tibial artery. IMPRESSION: Directed duplex demonstrates monophasic waveform of the common femoral artery, profunda femoris, SFA, popliteal artery. There is no signal identified within the distal anterior tibial artery, posterior tibial artery, peroneal artery. Signed, Dulcy Fanny. Dellia Nims, RPVI [facility] Electronically Signed   By: Corrie Mckusick D.O.   On: 12/15/2020 15:59   DG Foot Complete Left  Result Date: 12/14/2020 CLINICAL DATA:  Wound on foot near fourth and fifth MTP EXAM: LEFT FOOT - COMPLETE 3+ VIEW COMPARISON:  None. FINDINGS: No  fracture or malalignment. Vascular calcifications. No soft tissue emphysema or radiopaque foreign body. IMPRESSION: No acute osseous abnormality. Electronically Signed   By: Donavan Foil M.D.   On: 12/14/2020 15:18   DG Hips Bilat W or Wo Pelvis 3-4 Views  Result Date: 12/14/2020 CLINICAL DATA:  Fall, initial encounter. EXAM: DG HIP (WITH OR  WITHOUT PELVIS) 3-4V BILAT COMPARISON:  None. FINDINGS: Image quality is degraded by body habitus. No acute osseous or joint abnormality. Vascular stent is seen in the left femoral vasculature. IMPRESSION: Image quality is degraded by body habitus. No definite acute osseous or joint abnormality. Electronically Signed   By: Lorin Picket M.D.   On: 12/14/2020 15:17   Medications: Infusions: . ceFEPime (MAXIPIME) IV    . metronidazole 500 mg (12/16/20 0701)  . remdesivir 100 mg in NS 100 mL 100 mg (12/16/20 0824)  . vancomycin      Scheduled Medications: . aspirin EC  81 mg Oral Daily  . Chlorhexidine Gluconate Cloth  6 each Topical Daily  . collagenase   Topical Daily  . heparin  5,000 Units Subcutaneous Q8H  . levothyroxine  300 mcg Oral QHS  . midodrine  10 mg Oral TID WC  . nystatin   Topical BID  . pantoprazole  40 mg Oral QHS  . sevelamer carbonate  3,200 mg Oral TID WC  . ticagrelor  90 mg Oral BID    have reviewed scheduled and prn medications.  Physical Exam: Visualized thru ICU door given covid precautions-  TDC is HD access   12/16/2020,8:59 AM  LOS: 2 days

## 2020-12-17 DIAGNOSIS — I4891 Unspecified atrial fibrillation: Secondary | ICD-10-CM | POA: Diagnosis not present

## 2020-12-17 DIAGNOSIS — L089 Local infection of the skin and subcutaneous tissue, unspecified: Secondary | ICD-10-CM | POA: Diagnosis not present

## 2020-12-17 DIAGNOSIS — N186 End stage renal disease: Secondary | ICD-10-CM | POA: Diagnosis not present

## 2020-12-17 DIAGNOSIS — E785 Hyperlipidemia, unspecified: Secondary | ICD-10-CM | POA: Diagnosis not present

## 2020-12-17 LAB — RENAL FUNCTION PANEL
Albumin: 3.2 g/dL — ABNORMAL LOW (ref 3.5–5.0)
Anion gap: 16 — ABNORMAL HIGH (ref 5–15)
BUN: 32 mg/dL — ABNORMAL HIGH (ref 6–20)
CO2: 22 mmol/L (ref 22–32)
Calcium: 8.4 mg/dL — ABNORMAL LOW (ref 8.9–10.3)
Chloride: 95 mmol/L — ABNORMAL LOW (ref 98–111)
Creatinine, Ser: 7.51 mg/dL — ABNORMAL HIGH (ref 0.44–1.00)
GFR, Estimated: 6 mL/min — ABNORMAL LOW (ref >60)
Glucose, Bld: 85 mg/dL (ref 70–99)
Phosphorus: 5.7 mg/dL — ABNORMAL HIGH (ref 2.5–4.6)
Potassium: 4.3 mmol/L (ref 3.5–5.1)
Sodium: 133 mmol/L — ABNORMAL LOW (ref 135–145)

## 2020-12-17 LAB — CBC WITH DIFFERENTIAL/PLATELET
Abs Immature Granulocytes: 0.35 10*3/uL — ABNORMAL HIGH (ref 0.00–0.07)
Basophils Absolute: 0.1 10*3/uL (ref 0.0–0.1)
Basophils Relative: 1 %
Eosinophils Absolute: 0.5 10*3/uL (ref 0.0–0.5)
Eosinophils Relative: 4 %
HCT: 31.6 % — ABNORMAL LOW (ref 36.0–46.0)
Hemoglobin: 10.5 g/dL — ABNORMAL LOW (ref 12.0–15.0)
Immature Granulocytes: 3 %
Lymphocytes Relative: 6 %
Lymphs Abs: 0.8 10*3/uL (ref 0.7–4.0)
MCH: 32.2 pg (ref 26.0–34.0)
MCHC: 33.2 g/dL (ref 30.0–36.0)
MCV: 96.9 fL (ref 80.0–100.0)
Monocytes Absolute: 0.9 10*3/uL (ref 0.1–1.0)
Monocytes Relative: 7 %
Neutro Abs: 11.3 10*3/uL — ABNORMAL HIGH (ref 1.7–7.7)
Neutrophils Relative %: 79 %
Platelets: 158 10*3/uL (ref 150–400)
RBC: 3.26 MIL/uL — ABNORMAL LOW (ref 3.87–5.11)
RDW: 16.5 % — ABNORMAL HIGH (ref 11.5–15.5)
WBC: 13.9 10*3/uL — ABNORMAL HIGH (ref 4.0–10.5)
nRBC: 0 % (ref 0.0–0.2)

## 2020-12-17 MED ORDER — HYDROCODONE-ACETAMINOPHEN 5-325 MG PO TABS
1.0000 | ORAL_TABLET | Freq: Four times a day (QID) | ORAL | Status: DC | PRN
  Administered 2020-12-18 – 2020-12-20 (×4): 1 via ORAL
  Filled 2020-12-17 (×4): qty 1

## 2020-12-17 MED ORDER — HYDROCODONE-ACETAMINOPHEN 5-325 MG PO TABS: 1.0000 | ORAL_TABLET | Freq: Four times a day (QID) | ORAL | 0 refills | Status: DC | PRN

## 2020-12-17 MED ORDER — FENTANYL CITRATE (PF) 100 MCG/2ML IJ SOLN
12.5000 ug | INTRAMUSCULAR | Status: DC | PRN
  Administered 2020-12-18 – 2020-12-20 (×6): 12.5 ug via INTRAVENOUS
  Filled 2020-12-17 (×9): qty 2

## 2020-12-17 MED ORDER — FENTANYL CITRATE (PF) 100 MCG/2ML IJ SOLN: 12.5000 ug | INTRAMUSCULAR | 0 refills | Status: DC | PRN

## 2020-12-17 NOTE — Progress Notes (Signed)
PROGRESS NOTE   Lori [name]  [id number] DOB: 1970-12-04 DOA: 12/14/2020 PCP: Patient, No Pcp Per   Chief Complaint  Patient presents with  . Fall    Brief Admission History:   50 y.o. female, past medical history of CAD status post stent to LAD and 2020, ESRD on hemodialysis Monday Wednesday Friday, followed by Dr. Marval Regal, hypertension, hypothyroidism, hyperparathyroidism, GERD. -Patient presents to ED status post fall, head pain, back pain and generalized weakness, patient reported mechanical fall as she was getting out of the [place] for her hemodialysis, she does report a fall with head trauma, denies any syncope or near syncope, for very generalized weak, fatigue, reports she did not finish her dialysis due to weakness, and she was sent to ED for further evaluation, any fever, chills, cough, shortness of breath, patient reports left foot wound for last month, she has been following by podiatry in [place] [employer], she finished antibiotic course last week, report wound is not improving, as well reports she has been out of her cardiac medications for last few weeks, and could not renew as she did not see her cardiologist (Dr. Terrence Dupont).  Patient denies any respiratory symptoms, no cough, no shortness of breath, reports she tested negative for COVID last week.  Her screening COVID-19 test came back positive in ED  -Presentation to ED, blood pressure was soft, white blood cell elevated at 20 K, but she had normal lactic acid, x-ray of infected foot with no evidence of osteomyelitis, hypotensive responded to fluid bolus, [facility]ist consulted to admit.   Assessment & Plan:   Active Problems:   ESRD on dialysis ([place])   BMI 40.0-44.9, adult (HCC)   Atrial fibrillation (HCC)   Hyperlipidemia, unspecified   Infected wound   Cellulitis of fourth toe of left foot  1. Sepsis ruled out.  Left foot wound infection of left 4th/5th toes limited to skin breakdown - Unfortunately from the vascular studies  done she appears to have chronic ischemia of the left lower extremity with very poor vascular flow of the left foot.  I discussed with Dr. Arnoldo Morale and patient not ready to consider amputation at LLE at this time but wants to have evaluation and opinion from her primary vascular surgeon Dr. Delana Meyer at [facility].  I spoke with Dr. Delana Meyer and he is agreeable to consult on patient if we transfer to [facility]. Transfer orders placed.  Awaiting for bed assignment at [facility].  Continue broad spectrum antibiotics.  No evidence of osteomyelitis.  2. Leukocytosis - from left foot infection - WBC trending down.   3. Left foot and leg pain - secondary to chronic ischemia, added fentanyl IV for severe pain symptoms.     4. Covid Infection - incidental finding - given high risk status started on remdesivir x 3 day course.  5. CAD - continue aspirin and brilinta.  6. Chronic atrial fibrillation - Pt says she has not been taking her amiodarone.  HR controlled. 7. Hypotension - has not been persistently low but has intermittently been low, she is on midodrine.  Continue to follow.  8. Hypothyroidism - continue levothyroxine.    DVT prophylaxis: heparin Code Status: full  Family Communication: call to daughter was rejected x 2 Disposition: home with [facility]  Status is: Inpatient  Remains inpatient appropriate because:IV treatments appropriate due to intensity of illness or inability to take PO and Inpatient level of care appropriate due to severity of illness  Dispo: The patient is from: Home  Anticipated d/c is to: Home              Anticipated d/c date is: 1 day              Patient currently is not medically stable to d/c.  Consultants:   Surgery  Nephrology  Procedures:     Antimicrobials:    Subjective: Pt sedated from pain meds but reports foot pain and burning is bearable with pain meds.  She still requesting transfer to [facility] to see Dr. Delana Meyer.    Objective: Vitals:   12/17/20 0300 12/17/20  0400 12/17/20 0500 12/17/20 0600  BP: 129/77 (!) 80/35    Pulse: 64 (!) 56 (!) 53 (!) 57  Resp: 19 (!) '7 11 11  '$ Temp:      TempSrc:      SpO2: (!) 89% 100% 100% 99%  Weight:      Height:        Intake/Output Summary (Last 24 hours) at 12/17/2020 1156 Last data filed at 12/17/2020 0800 Gross per 24 hour  Intake 389.2 ml  Output 1434 ml  Net -1044.8 ml   Filed Weights   12/15/20 1244 12/16/20 0500 12/16/20 1505  Weight: 109.5 kg 109.9 kg 106.2 kg    Examination:  General exam: chronically ill appearing female, Appears calm and comfortable  Respiratory system: Clear to auscultation. Respiratory effort normal. Cardiovascular system: normal S1 & S2 heard. No JVD, murmurs, rubs, gallops or clicks. No pedal edema. Gastrointestinal system: Abdomen is nondistended, soft and nontender. No organomegaly or masses felt. Normal bowel sounds heard. Central nervous system: Alert and oriented. No focal neurological deficits. Extremities: cool left leg, left foot wound unchanged, small necrotic area seen on dorsal left foot, no drainage seen. No pedal pulses palpated in left foot.  Skin: No rashes, lesions or ulcers Psychiatry: Judgement and insight appear normal. Mood & affect appropriate.   Data Reviewed: I have personally reviewed following labs and imaging studies  CBC: Recent Labs  Lab 12/14/20 1715 12/15/20 0613 12/16/20 0417 12/17/20 0507  WBC 20.6* 17.2* 15.4* 13.9*  NEUTROABS 16.9*  --   --  11.3*  HGB 11.4* 11.0* 10.6* 10.5*  HCT 35.1* 34.3* 32.9* 31.6*  MCV 99.4 99.4 100.0 96.9  PLT 167 152 150 0000000    Basic Metabolic Panel: Recent Labs  Lab 12/14/20 1715 12/15/20 0613 12/16/20 0417 12/17/20 0506  NA 132* 130* 130* 133*  K 5.1 4.8 4.9 4.3  CL 94* 92* 93* 95*  CO2 21* 21* 18* 22  GLUCOSE 80 86 79 85  BUN 40* 43* 53* 32*  CREATININE 9.68* 9.98* 11.27* 7.51*  CALCIUM 8.2* 8.0* 7.9* 8.4*  PHOS  --   --  7.4* 5.7*    GFR: Estimated Creatinine Clearance: 10.3  mL/min (A) (by C-G formula based on SCr of 7.51 mg/dL (H)).  Liver Function Tests: Recent Labs  Lab 12/14/20 1715 12/16/20 0417 12/17/20 0506  AST 65*  --   --   ALT 27  --   --   ALKPHOS 50  --   --   BILITOT 0.6  --   --   PROT 7.1  --   --   ALBUMIN 3.5 3.0* 3.2*    CBG: No results for input(s): GLUCAP in the last 168 hours.  Recent Results (from the past 240 hour(s))  Blood culture (routine x 2)     Status: None (Preliminary result)   Collection Time: 12/14/20  2:18 PM   Specimen: Right  Antecubital; Blood  Result Value Ref Range Status   Specimen Description RIGHT ANTECUBITAL  Final   Special Requests   Final    BOTTLES DRAWN AEROBIC AND ANAEROBIC Blood Culture results may not be optimal due to an inadequate volume of blood received in culture bottles   Culture   Final    NO GROWTH 3 DAYS Performed at [facility], [address], [place], [place] [address]    Report Status PENDING  Incomplete  Resp Panel by RT-PCR (Flu A&B, Covid) Nasopharyngeal Swab     Status: Abnormal   Collection Time: 12/14/20  7:21 PM   Specimen: Nasopharyngeal Swab; Nasopharyngeal(NP) swabs in vial transport medium  Result Value Ref Range Status   SARS Coronavirus 2 by RT PCR POSITIVE (A) NEGATIVE Final    Comment: RESULT CALLED TO, READ BACK BY AND VERIFIED WITH: HARRY,A @ 2016 ON 12/14/20 BY JUW (NOTE) SARS-CoV-2 target nucleic acids are DETECTED.  The SARS-CoV-2 RNA is generally detectable in upper respiratory specimens during the acute phase of infection. Positive results are indicative of the presence of the identified virus, but do not rule out bacterial infection or co-infection with other pathogens not detected by the test. Clinical correlation with patient history and other diagnostic information is necessary to determine patient infection status. The expected result is Negative.  Fact Sheet for Patients: EntrepreneurPulse.com.au  Fact Sheet for Healthcare  Providers: IncredibleEmployment.be  This test is not yet approved or cleared by the [place] FDA and  has been authorized for detection and/or diagnosis of SARS-CoV-2 by FDA under an Emergency Use Authorization (EUA).  This EUA will remain in effect (meaning this test can be  used) for the duration of  the COVID-19 declaration under Section 564(b)(1) of the Act, 21 U.S.C. section 360bbb-3(b)(1), unless the authorization is terminated or revoked sooner.     Influenza A by PCR NEGATIVE NEGATIVE Final   Influenza B by PCR NEGATIVE NEGATIVE Final    Comment: (NOTE) The Xpert Xpress SARS-CoV-2/FLU/RSV plus assay is intended as an aid in the diagnosis of influenza from Nasopharyngeal swab specimens and should not be used as a sole basis for treatment. Nasal washings and aspirates are unacceptable for Xpert Xpress SARS-CoV-2/FLU/RSV testing.  Fact Sheet for Patients: EntrepreneurPulse.com.au  Fact Sheet for Healthcare Providers: IncredibleEmployment.be  This test is not yet approved or cleared by the [place] FDA and has been authorized for detection and/or diagnosis of SARS-CoV-2 by FDA under an Emergency Use Authorization (EUA). This EUA will remain in effect (meaning this test can be used) for the duration of the COVID-19 declaration under Section 564(b)(1) of the Act, 21 U.S.C. section 360bbb-3(b)(1), unless the authorization is terminated or revoked.  Performed at [facility], [address] [place], [place] [address]   Blood culture (routine x 2)     Status: None (Preliminary result)   Collection Time: 12/14/20  9:25 PM   Specimen: Neck; Blood  Result Value Ref Range Status   Specimen Description NECK  Final   Special Requests   Final    BOTTLES DRAWN AEROBIC AND ANAEROBIC Blood Culture adequate volume   Culture   Final    NO GROWTH 3 DAYS Performed at [facility], [address], [place], [place] [address]     Report Status PENDING  Incomplete     Radiology Studies: US ARTERIAL LOWER EXTREMITY DUPLEX LEFT (NON-ABI)  Result Date: 12/15/2020 CLINICAL DATA:  50 year old female with a left foot wound EXAM: NONINVASIVE PHYSIOLOGIC VASCULAR STUDY OF  UNILATERAL LOWER EXTREMITIES TECHNIQUE: Evaluation of left lower extremities was performed at rest, including directed duplex. COMPARISON:  None. FINDINGS: Left ABI: Not acquired Left Lower Extremity: Directed duplex left lower extremity demonstrates monophasic waveform of the common femoral artery, superficial femoral artery, popliteal artery. Monophasic anterior tibial artery proximally. No signal distally. No signal of the distal peroneal artery or posterior tibial artery. IMPRESSION: Directed duplex demonstrates monophasic waveform of the common femoral artery, profunda femoris, SFA, popliteal artery. There is no signal identified within the distal anterior tibial artery, posterior tibial artery, peroneal artery. Signed, Dulcy Fanny. Dellia Nims, RPVI [facility] Radiology Electronically Signed   By: Corrie Mckusick D.O.   On: 12/15/2020 15:59   ECHOCARDIOGRAM COMPLETE  Result Date: 12/16/2020    ECHOCARDIOGRAM REPORT   Patient Name:   Lori [name] Date of Exam: 12/16/2020 Medical Rec #:  [id number]    Height:       63.0 in Accession #:    [id number]   Weight:       242.3 lb Date of Birth:  Apr 15, 1971    BSA:          2.098 m Patient Age:    61 years     BP:           129/68 mmHg Patient Gender: F            HR:           74 bpm. Exam Location:  [name] Procedure: 2D Echo Indications:    Abnormal ECG R94.31  History:        Patient has prior history of Echocardiogram examinations, most                 recent 10/13/2019. TIA, Arrythmias:Atrial Fibrillation,                 Signs/Symptoms:Syncope and Chest Pain; Risk Factors:Dyslipidemia                 and Former Smoker. ESRD.  Sonographer:    Leavy Cella RDCS (AE)  Referring Phys: [place]  1. Left ventricular ejection fraction, by estimation, is 55 to 60%. The left ventricle has normal function. Left ventricular endocardial border not optimally defined to evaluate regional wall motion. There is moderate left ventricular hypertrophy. Left ventricular diastolic parameters are consistent with Grade II diastolic dysfunction (pseudonormalization).  2. Right ventricular systolic function is normal. The right ventricular size is normal. Tricuspid regurgitation signal is inadequate for assessing PA pressure.  3. Left atrial size was severely dilated.  4. The mitral valve is abnormal. Trivial mitral valve regurgitation. Moderate to severe mitral annular calcification.  5. The aortic valve is tricuspid. There is moderate calcification of the aortic valve. Aortic valve regurgitation is not visualized. Mild to moderate aortic valve sclerosis/calcification is present, without any evidence of aortic stenosis. Aortic valve mean gradient measures 8.3 mmHg. Aortic valve Vmax measures 2.02 m/s.  6. The inferior vena cava is normal in size with greater than 50% respiratory variability, suggesting right atrial pressure of 3 mmHg. FINDINGS  Left Ventricle: Left ventricular ejection fraction, by estimation, is 55 to 60%. The left ventricle has normal function. Left ventricular endocardial border not optimally defined to evaluate regional wall motion. The left ventricular internal cavity size was small. There is moderate left ventricular hypertrophy. Left ventricular diastolic parameters are consistent with Grade II diastolic dysfunction (pseudonormalization). Right Ventricle: The right ventricular size is normal. No  increase in right ventricular wall thickness. Right ventricular systolic function is normal. Tricuspid regurgitation signal is inadequate for assessing PA pressure. Left Atrium: Left atrial size was severely dilated. Right Atrium: Right atrial size was normal  in size. Pericardium: There is no evidence of pericardial effusion. Presence of pericardial fat pad. Mitral Valve: The mitral valve is abnormal. There is mild thickening of the mitral valve leaflet(s). There is mild calcification of the mitral valve leaflet(s). Moderate to severe mitral annular calcification. Trivial mitral valve regurgitation. Tricuspid Valve: The tricuspid valve is grossly normal. Tricuspid valve regurgitation is trivial. Aortic Valve: The aortic valve is tricuspid. There is moderate calcification of the aortic valve. There is moderate aortic valve annular calcification. Aortic valve regurgitation is not visualized. Mild to moderate aortic valve sclerosis/calcification is  present, without any evidence of aortic stenosis. Aortic valve mean gradient measures 8.3 mmHg. Aortic valve peak gradient measures 16.3 mmHg. Aortic valve area, by VTI measures 2.51 cm. Pulmonic Valve: The pulmonic valve was not well visualized. Pulmonic valve regurgitation is not visualized. Aorta: The aortic root is normal in size and structure. Venous: The inferior vena cava is normal in size with greater than 50% respiratory variability, suggesting right atrial pressure of 3 mmHg. IAS/Shunts: No atrial level shunt detected by color flow Doppler.  LEFT VENTRICLE PLAX 2D LVIDd:         2.61 cm  Diastology LVIDs:         1.80 cm  LV e' medial:    4.20 cm/s LV PW:         1.59 cm  LV E/e' medial:  29.0 LV IVS:        1.52 cm  LV e' lateral:   3.00 cm/s LVOT diam:     2.10 cm  LV E/e' lateral: 40.7 LV SV:         103 LV SV Index:   49 LVOT Area:     3.46 cm  RIGHT VENTRICLE RV S prime:     9.49 cm/s TAPSE (M-mode): 1.7 cm LEFT ATRIUM              Index       RIGHT ATRIUM           Index LA diam:        3.60 cm  1.72 cm/m  RA Area:     10.80 cm LA Vol (A2C):   44.6 ml  21.26 ml/m RA Volume:   21.80 ml  10.39 ml/m LA Vol (A4C):   108.0 ml 51.48 ml/m LA Biplane Vol: 73.1 ml  34.84 ml/m  AORTIC VALVE AV Area (Vmax):    2.31  cm AV Area (Vmean):   2.63 cm AV Area (VTI):     2.51 cm AV Vmax:           201.67 cm/s AV Vmean:          130.333 cm/s AV VTI:            0.410 m AV Peak Grad:      16.3 mmHg AV Mean Grad:      8.3 mmHg LVOT Vmax:         134.33 cm/s LVOT Vmean:        99.033 cm/s LVOT VTI:          0.297 m LVOT/AV VTI ratio: 0.72  AORTA Ao Root diam: 2.90 cm MITRAL VALVE MV Area (PHT): 2.01 cm     SHUNTS MV Decel Time: 378 msec  Systemic VTI:  0.30 m MV E velocity: 122.00 cm/s  Systemic Diam: 2.10 cm MV A velocity: 49.60 cm/s MV E/A ratio:  2.46 Rozann Lesches MD Electronically signed by Rozann Lesches MD Signature Date/Time: 12/16/2020/11:20:42 AM    Final    Scheduled Meds: . aspirin EC  81 mg Oral Daily  . calcitRIOL  0.75 mcg Oral Q M,W,F-HD  . Chlorhexidine Gluconate Cloth  6 each Topical Daily  . Chlorhexidine Gluconate Cloth  6 each Topical Q0600  . collagenase   Topical Daily  . heparin  5,000 Units Subcutaneous Q8H  . levothyroxine  300 mcg Oral QHS  . midodrine  10 mg Oral TID WC  . nystatin   Topical BID  . pantoprazole  40 mg Oral QHS  . sevelamer carbonate  3,200 mg Oral TID WC  . ticagrelor  90 mg Oral BID   Continuous Infusions: . sodium chloride    . sodium chloride    . ceFEPime (MAXIPIME) IV 2 g (12/16/20 1800)  . metronidazole 500 mg (12/17/20 0509)  . vancomycin 1,000 mg (12/16/20 1700)    LOS: 3 days   Time spent: 35 mins   Taneasha Fuqua Wynetta Emery, MD How to contact the [facility] Attending or Consulting provider [place] or covering provider during after hours [place], for this patient?  1. Check the care team in [facility] and look for a) attending/consulting [facility] provider listed and b) the [facility] team listed 2. Log into [email or web address] and use [place]'s universal password to access. If you do not have the password, please contact the hospital operator. 3. Locate the [facility] provider you are looking for under [facility] and page to a number that you can be directly reached. 4. If you still  have difficulty reaching the provider, please page the [facility] (Director on Call) for the Hospitalists listed on amion for assistance.  12/17/2020, 11:56 AM

## 2020-12-17 NOTE — Progress Notes (Addendum)
Performed wound care as ordered. Went back in to check on patient and she had unwrapped her foot and was wiping the Santyl off the area. She states "It burns. It just feels better if I rub it." Educated patient who then states "I don't care, this feels better."

## 2020-12-18 DIAGNOSIS — N186 End stage renal disease: Secondary | ICD-10-CM | POA: Diagnosis not present

## 2020-12-18 DIAGNOSIS — L089 Local infection of the skin and subcutaneous tissue, unspecified: Secondary | ICD-10-CM | POA: Diagnosis not present

## 2020-12-18 DIAGNOSIS — I4891 Unspecified atrial fibrillation: Secondary | ICD-10-CM | POA: Diagnosis not present

## 2020-12-18 DIAGNOSIS — E785 Hyperlipidemia, unspecified: Secondary | ICD-10-CM | POA: Diagnosis not present

## 2020-12-18 LAB — CBC WITH DIFFERENTIAL/PLATELET
Abs Immature Granulocytes: 0.46 10*3/uL — ABNORMAL HIGH (ref 0.00–0.07)
Basophils Absolute: 0.1 10*3/uL (ref 0.0–0.1)
Basophils Relative: 1 %
Eosinophils Absolute: 0.4 10*3/uL (ref 0.0–0.5)
Eosinophils Relative: 2 %
HCT: 32.5 % — ABNORMAL LOW (ref 36.0–46.0)
Hemoglobin: 10.6 g/dL — ABNORMAL LOW (ref 12.0–15.0)
Immature Granulocytes: 3 %
Lymphocytes Relative: 6 %
Lymphs Abs: 0.9 10*3/uL (ref 0.7–4.0)
MCH: 31.8 pg (ref 26.0–34.0)
MCHC: 32.6 g/dL (ref 30.0–36.0)
MCV: 97.6 fL (ref 80.0–100.0)
Monocytes Absolute: 1 10*3/uL (ref 0.1–1.0)
Monocytes Relative: 6 %
Neutro Abs: 13.4 10*3/uL — ABNORMAL HIGH (ref 1.7–7.7)
Neutrophils Relative %: 82 %
Platelets: 157 10*3/uL (ref 150–400)
RBC: 3.33 MIL/uL — ABNORMAL LOW (ref 3.87–5.11)
RDW: 16.5 % — ABNORMAL HIGH (ref 11.5–15.5)
WBC: 16.3 10*3/uL — ABNORMAL HIGH (ref 4.0–10.5)
nRBC: 0 % (ref 0.0–0.2)

## 2020-12-18 LAB — RENAL FUNCTION PANEL
Albumin: 3.3 g/dL — ABNORMAL LOW (ref 3.5–5.0)
Anion gap: 18 — ABNORMAL HIGH (ref 5–15)
BUN: 43 mg/dL — ABNORMAL HIGH (ref 6–20)
CO2: 21 mmol/L — ABNORMAL LOW (ref 22–32)
Calcium: 8.5 mg/dL — ABNORMAL LOW (ref 8.9–10.3)
Chloride: 95 mmol/L — ABNORMAL LOW (ref 98–111)
Creatinine, Ser: 9.09 mg/dL — ABNORMAL HIGH (ref 0.44–1.00)
GFR, Estimated: 5 mL/min — ABNORMAL LOW (ref >60)
Glucose, Bld: 82 mg/dL (ref 70–99)
Phosphorus: 7 mg/dL — ABNORMAL HIGH (ref 2.5–4.6)
Potassium: 4.8 mmol/L (ref 3.5–5.1)
Sodium: 134 mmol/L — ABNORMAL LOW (ref 135–145)

## 2020-12-18 MED ORDER — GABAPENTIN 100 MG PO CAPS
100.0000 mg | ORAL_CAPSULE | Freq: Every day | ORAL | Status: DC
  Administered 2020-12-18 – 2020-12-19 (×2): 100 mg via ORAL
  Filled 2020-12-18 (×2): qty 1

## 2020-12-18 MED ORDER — CHLORHEXIDINE GLUCONATE CLOTH 2 % EX PADS
6.0000 | MEDICATED_PAD | Freq: Every day | CUTANEOUS | Status: DC
  Administered 2020-12-19 – 2020-12-20 (×2): 6 via TOPICAL

## 2020-12-18 NOTE — Progress Notes (Signed)
PROGRESS NOTE   Lori [name]  [id number] DOB: Jun 05, 1971 DOA: 12/14/2020 PCP: Patient, No Pcp Per   Chief Complaint  Patient presents with  . Fall    Brief Admission History:   50 y.o. female, past medical history of CAD status post stent to LAD and 2020, ESRD on hemodialysis Monday Wednesday Friday, followed by Dr. Marval Regal, hypertension, hypothyroidism, hyperparathyroidism, GERD. -Patient presents to ED status post fall, head pain, back pain and generalized weakness, patient reported mechanical fall as she was getting out of the [place] for her hemodialysis, she does report a fall with head trauma, denies any syncope or near syncope, for very generalized weak, fatigue, reports she did not finish her dialysis due to weakness, and she was sent to ED for further evaluation, any fever, chills, cough, shortness of breath, patient reports left foot wound for last month, she has been following by podiatry in [place], she finished antibiotic course last week, report wound is not improving, as well reports she has been out of her cardiac medications for last few weeks, and could not renew as she did not see her cardiologist (Dr. Terrence Dupont).  Patient denies any respiratory symptoms, no cough, no shortness of breath, reports she tested negative for COVID last week.  Her screening COVID-19 test came back positive in ED  -Presentation to ED, blood pressure was soft, white blood cell elevated at 20 K, but she had normal lactic acid, x-ray of infected foot with no evidence of osteomyelitis, hypotensive responded to fluid bolus, [facility] hospitalist consulted to admit.   Assessment & Plan:   Active Problems:   ESRD on dialysis ([place])   BMI 40.0-44.9, adult (HCC)   Atrial fibrillation (HCC)   Hyperlipidemia, unspecified   Infected wound   Cellulitis of fourth toe of left foot  1. Sepsis ruled out.  Left foot wound infection of left 4th/5th toes limited to skin breakdown - Unfortunately from the vascular studies  done she appears to have chronic ischemia of the left lower extremity with very poor vascular flow of the left foot.  I discussed with Dr. Arnoldo Morale and patient not ready to consider amputation at LLE at this time but she wants to have evaluation and opinion from her primary vascular surgeon Dr. Delana Meyer at [facility].  I spoke with Dr. Delana Meyer and he is agreeable to consult on patient if we transfer to [facility] Transfer orders placed.  Awaiting for bed assignment at [facility].  Wound culture sent from purulent secretion from wound 1/23.  Continue broad spectrum antibiotics.  No evidence of osteomyelitis. Pt will need operative management as definitive treatment.   2. Leukocytosis - from left foot infection - Monitoring.   3. ESRD on HD MWF - Pt will be due for HD 1/24. Nephrology team following.  4. Left foot and leg pain - secondary to chronic ischemia, added fentanyl IV for severe pain symptoms.     5. Covid Infection - incidental finding - given high risk status started on remdesivir x 3 day course.  6. CAD - continue aspirin and brilinta.  7. Chronic atrial fibrillation - Pt says she has not been taking her amiodarone.  HR controlled. 8. Hypotension - has not been persistently low but has intermittently been low, she is on midodrine.  Continue to follow.  9. Hypothyroidism - continue levothyroxine.    DVT prophylaxis: heparin Code Status: full  Family Communication: call to daughter was rejected x 3 Disposition: TBD (anticipate may need SNF if surgery is involved)  Status  is: Inpatient  Remains inpatient appropriate because:IV treatments appropriate due to intensity of illness or inability to take PO and Inpatient level of care appropriate due to severity of illness  Dispo: The patient is from: Home              Anticipated d/c is to: Home              Anticipated d/c date is: 3 days              Patient currently is not medically stable to d/c.  Consultants:   Surgery  Nephrology  Procedures:      Antimicrobials:    Subjective: Pt still insists on having Dr. Delana Meyer evaluate her left leg ischemia at [facility].  Pain better controlled with current regimen.     Objective: Vitals:   12/18/20 0500 12/18/20 0600 12/18/20 0700 12/18/20 0800  BP: 112/74 (!) 80/61 124/79 131/89  Pulse: 60 65 63 (!) 57  Resp:  18 13   Temp:    97.7 F (36.5 C)  TempSrc:    Oral  SpO2: (!) 81% (!) 88% 97% 100%  Weight:      Height:        Intake/Output Summary (Last 24 hours) at 12/18/2020 1531 Last data filed at 12/18/2020 0900 Gross per 24 hour  Intake 300 ml  Output --  Net 300 ml   Filed Weights   12/16/20 0500 12/16/20 1505 12/18/20 0300  Weight: 109.9 kg 106.2 kg 107 kg    Examination:  General exam: chronically ill appearing female, Appears calm and comfortable  Respiratory system: Clear to auscultation. Respiratory effort normal. Cardiovascular system: normal S1 & S2 heard. No JVD, murmurs, rubs, gallops or clicks. No pedal edema. Gastrointestinal system: Abdomen is nondistended, soft and nontender. No organomegaly or masses felt. Normal bowel sounds heard. Central nervous system: Alert and oriented. No focal neurological deficits. Extremities: cool left leg, left foot wound unchanged, small necrotic area seen on dorsal left foot, no drainage seen. No pedal pulses palpated in left foot.  Skin: No rashes, lesions or ulcers Psychiatry: Judgement and insight appear normal. Mood & affect appropriate.   Data Reviewed: I have personally reviewed following labs and imaging studies  CBC: Recent Labs  Lab 12/14/20 1715 12/15/20 0613 12/16/20 0417 12/17/20 0507 12/18/20 0411  WBC 20.6* 17.2* 15.4* 13.9* 16.3*  NEUTROABS 16.9*  --   --  11.3* 13.4*  HGB 11.4* 11.0* 10.6* 10.5* 10.6*  HCT 35.1* 34.3* 32.9* 31.6* 32.5*  MCV 99.4 99.4 100.0 96.9 97.6  PLT 167 152 150 158 [id number]    Basic Metabolic Panel: Recent Labs  Lab 12/14/20 1715 12/15/20 0613 12/16/20 0417 12/17/20 0506  12/18/20 0411  NA 132* 130* 130* 133* 134*  K 5.1 4.8 4.9 4.3 4.8  CL 94* 92* 93* 95* 95*  CO2 21* 21* 18* 22 21*  GLUCOSE 80 86 79 85 82  BUN 40* 43* 53* 32* 43*  CREATININE 9.68* 9.98* 11.27* 7.51* 9.09*  CALCIUM 8.2* 8.0* 7.9* 8.4* 8.5*  PHOS  --   --  7.4* 5.7* 7.0*    GFR: Estimated Creatinine Clearance: 8.6 mL/min (A) (by C-G formula based on SCr of 9.09 mg/dL (H)).  Liver Function Tests: Recent Labs  Lab 12/14/20 1715 12/16/20 0417 12/17/20 0506 12/18/20 0411  AST 65*  --   --   --   ALT 27  --   --   --   ALKPHOS 50  --   --   --  BILITOT 0.6  --   --   --   PROT 7.1  --   --   --   ALBUMIN 3.5 3.0* 3.2* 3.3*    CBG: No results for input(s): GLUCAP in the last 168 hours.  Recent Results (from the past 240 hour(s))  Blood culture (routine x 2)     Status: None (Preliminary result)   Collection Time: 12/14/20  2:18 PM   Specimen: Right Antecubital; Blood  Result Value Ref Range Status   Specimen Description RIGHT ANTECUBITAL  Final   Special Requests   Final    BOTTLES DRAWN AEROBIC AND ANAEROBIC Blood Culture results may not be optimal due to an inadequate volume of blood received in culture bottles   Culture   Final    NO GROWTH 3 DAYS Performed at [facility], [address], [place], [place] [address]    Report Status PENDING  Incomplete  Resp Panel by RT-PCR (Flu A&B, Covid) Nasopharyngeal Swab     Status: Abnormal   Collection Time: 12/14/20  7:21 PM   Specimen: Nasopharyngeal Swab; Nasopharyngeal(NP) swabs in vial transport medium  Result Value Ref Range Status   SARS Coronavirus 2 by RT PCR POSITIVE (A) NEGATIVE Final    Comment: RESULT CALLED TO, READ BACK BY AND VERIFIED WITH: HARRY,A @ 2016 ON 12/14/20 BY JUW (NOTE) SARS-CoV-2 target nucleic acids are DETECTED.  The SARS-CoV-2 RNA is generally detectable in upper respiratory specimens during the acute phase of infection. Positive results are indicative of the presence of the identified  virus, but do not rule out bacterial infection or co-infection with other pathogens not detected by the test. Clinical correlation with patient history and other diagnostic information is necessary to determine patient infection status. The expected result is Negative.  Fact Sheet for Patients: EntrepreneurPulse.com.au  Fact Sheet for Healthcare Providers: IncredibleEmployment.be  This test is not yet approved or cleared by the [place] FDA and  has been authorized for detection and/or diagnosis of SARS-CoV-2 by FDA under an Emergency Use Authorization (EUA).  This EUA will remain in effect (meaning this test can be  used) for the duration of  the COVID-19 declaration under Section 564(b)(1) of the Act, 21 U.S.C. section 360bbb-3(b)(1), unless the authorization is terminated or revoked sooner.     Influenza A by PCR NEGATIVE NEGATIVE Final   Influenza B by PCR NEGATIVE NEGATIVE Final    Comment: (NOTE) The Xpert Xpress SARS-CoV-2/FLU/RSV plus assay is intended as an aid in the diagnosis of influenza from Nasopharyngeal swab specimens and should not be used as a sole basis for treatment. Nasal washings and aspirates are unacceptable for Xpert Xpress SARS-CoV-2/FLU/RSV testing.  Fact Sheet for Patients: EntrepreneurPulse.com.au  Fact Sheet for Healthcare Providers: IncredibleEmployment.be  This test is not yet approved or cleared by the [place] FDA and has been authorized for detection and/or diagnosis of SARS-CoV-2 by FDA under an Emergency Use Authorization (EUA). This EUA will remain in effect (meaning this test can be used) for the duration of the COVID-19 declaration under Section 564(b)(1) of the Act, 21 U.S.C. section 360bbb-3(b)(1), unless the authorization is terminated or revoked.  Performed at [facility], [address], [place], [place] [address]   Blood culture (routine x 2)      Status: None (Preliminary result)   Collection Time: 12/14/20  9:25 PM   Specimen: Neck; Blood  Result Value Ref Range Status   Specimen Description NECK  Final   Special Requests   Final  BOTTLES DRAWN AEROBIC AND ANAEROBIC Blood Culture adequate volume   Culture   Final    NO GROWTH 3 DAYS Performed at [facility], [address], [place], [place] [address]    Report Status PENDING  Incomplete     Radiology Studies: No results found. Scheduled Meds: . aspirin EC  81 mg Oral Daily  . calcitRIOL  0.75 mcg Oral Q M,W,F-HD  . Chlorhexidine Gluconate Cloth  6 each Topical Daily  . Chlorhexidine Gluconate Cloth  6 each Topical Q0600  . collagenase   Topical Daily  . heparin  5,000 Units Subcutaneous Q8H  . levothyroxine  300 mcg Oral QHS  . midodrine  10 mg Oral TID WC  . nystatin   Topical BID  . pantoprazole  40 mg Oral QHS  . sevelamer carbonate  3,200 mg Oral TID WC  . ticagrelor  90 mg Oral BID   Continuous Infusions: . sodium chloride    . sodium chloride    . ceFEPime (MAXIPIME) IV 2 g (12/16/20 1800)  . metronidazole 500 mg (12/18/20 1154)  . vancomycin 1,000 mg (12/16/20 1700)    LOS: 4 days   Time spent: 35 mins   Delana Manganello Wynetta Emery, MD How to contact the [facility] Attending or Consulting provider [place] or covering provider during after hours [place], for this patient?  1. Check the care team in [facility] and look for a) attending/consulting TRH provider listed and b) the [facility] team listed 2. Log into [email or web address] and use [place]'s universal password to access. If you do not have the password, please contact the hospital operator. 3. Locate the [facility] provider you are looking for under [facility] and page to a number that you can be directly reached. 4. If you still have difficulty reaching the provider, please page the [facility] (Director on Call) for the Hospitalists listed on amion for assistance.  12/18/2020, 3:31 PM

## 2020-12-19 DIAGNOSIS — I4891 Unspecified atrial fibrillation: Secondary | ICD-10-CM | POA: Diagnosis not present

## 2020-12-19 DIAGNOSIS — N186 End stage renal disease: Secondary | ICD-10-CM | POA: Diagnosis not present

## 2020-12-19 DIAGNOSIS — L089 Local infection of the skin and subcutaneous tissue, unspecified: Secondary | ICD-10-CM | POA: Diagnosis not present

## 2020-12-19 DIAGNOSIS — E785 Hyperlipidemia, unspecified: Secondary | ICD-10-CM | POA: Diagnosis not present

## 2020-12-19 LAB — RENAL FUNCTION PANEL
Albumin: 3.2 g/dL — ABNORMAL LOW (ref 3.5–5.0)
Anion gap: 17 — ABNORMAL HIGH (ref 5–15)
BUN: 59 mg/dL — ABNORMAL HIGH (ref 6–20)
CO2: 18 mmol/L — ABNORMAL LOW (ref 22–32)
Calcium: 8.5 mg/dL — ABNORMAL LOW (ref 8.9–10.3)
Chloride: 96 mmol/L — ABNORMAL LOW (ref 98–111)
Creatinine, Ser: 11.56 mg/dL — ABNORMAL HIGH (ref 0.44–1.00)
GFR, Estimated: 4 mL/min — ABNORMAL LOW (ref >60)
Glucose, Bld: 89 mg/dL (ref 70–99)
Phosphorus: 7.5 mg/dL — ABNORMAL HIGH (ref 2.5–4.6)
Potassium: 5.3 mmol/L — ABNORMAL HIGH (ref 3.5–5.1)
Sodium: 131 mmol/L — ABNORMAL LOW (ref 135–145)

## 2020-12-19 LAB — CBC WITH DIFFERENTIAL/PLATELET
Abs Immature Granulocytes: 1.03 10*3/uL — ABNORMAL HIGH (ref 0.00–0.07)
Basophils Absolute: 0.3 10*3/uL — ABNORMAL HIGH (ref 0.0–0.1)
Basophils Relative: 2 %
Eosinophils Absolute: 0.2 10*3/uL (ref 0.0–0.5)
Eosinophils Relative: 1 %
HCT: 38.2 % (ref 36.0–46.0)
Hemoglobin: 12.3 g/dL (ref 12.0–15.0)
Lymphocytes Relative: 10 %
Lymphs Abs: 1.7 10*3/uL (ref 0.7–4.0)
MCH: 32 pg (ref 26.0–34.0)
MCHC: 32.2 g/dL (ref 30.0–36.0)
MCV: 99.5 fL (ref 80.0–100.0)
Metamyelocytes Relative: 1 %
Monocytes Absolute: 1.6 10*3/uL — ABNORMAL HIGH (ref 0.1–1.0)
Monocytes Relative: 9 %
Myelocytes: 1 %
Neutro Abs: 13.1 10*3/uL — ABNORMAL HIGH (ref 1.7–7.7)
Neutrophils Relative %: 76 %
Platelets: 145 10*3/uL — ABNORMAL LOW (ref 150–400)
RBC: 3.84 MIL/uL — ABNORMAL LOW (ref 3.87–5.11)
RDW: 16.6 % — ABNORMAL HIGH (ref 11.5–15.5)
WBC: 17.3 10*3/uL — ABNORMAL HIGH (ref 4.0–10.5)
nRBC: 0 % (ref 0.0–0.2)

## 2020-12-19 LAB — CULTURE, BLOOD (ROUTINE X 2)
Culture: NO GROWTH
Culture: NO GROWTH
Special Requests: ADEQUATE

## 2020-12-19 MED ORDER — BISACODYL 10 MG RE SUPP: 10.0000 mg | Freq: Once | RECTAL | Status: AC

## 2020-12-19 MED ORDER — METRONIDAZOLE 500 MG PO TABS
500.0000 mg | ORAL_TABLET | Freq: Three times a day (TID) | ORAL | Status: DC
  Administered 2020-12-19 – 2020-12-20 (×3): 500 mg via ORAL
  Filled 2020-12-19 (×3): qty 1

## 2020-12-19 MED ORDER — HEPARIN SODIUM (PORCINE) 1000 UNIT/ML DIALYSIS: 20.0000 [IU]/kg | INTRAMUSCULAR | Status: DC | PRN

## 2020-12-19 MED ORDER — POLYETHYLENE GLYCOL 3350 17 G PO PACK
17.0000 g | PACK | Freq: Two times a day (BID) | ORAL | Status: DC
  Administered 2020-12-19 – 2020-12-20 (×3): 17 g via ORAL
  Filled 2020-12-19 (×3): qty 1

## 2020-12-19 MED ORDER — BISACODYL 10 MG RE SUPP
10.0000 mg | Freq: Once | RECTAL | Status: AC
  Administered 2020-12-19: 10 mg via RECTAL
  Filled 2020-12-19: qty 1

## 2020-12-19 MED ORDER — SIMETHICONE 80 MG PO CHEW
80.0000 mg | CHEWABLE_TABLET | Freq: Four times a day (QID) | ORAL | Status: AC
  Administered 2020-12-19 (×2): 80 mg via ORAL
  Filled 2020-12-19 (×2): qty 1

## 2020-12-19 NOTE — Progress Notes (Addendum)
PROGRESS NOTE   Lori [name]  [id number] DOB: Jan 03, 1971 DOA: 12/14/2020 PCP: Patient, No Pcp Per   Chief Complaint  Patient presents with  . Fall    Brief Admission History:   50 y.o. female, past medical history of CAD status post stent to LAD and 2020, ESRD on hemodialysis Monday Wednesday Friday, followed by Dr. Marval Regal, hypertension, hypothyroidism, hyperparathyroidism, GERD. -Patient presents to ED status post fall, head pain, back pain and generalized weakness, patient reported mechanical fall as she was getting out of the [place] for her hemodialysis, she does report a fall with head trauma, denies any syncope or near syncope, for very generalized weak, fatigue, reports she did not finish her dialysis due to weakness, and she was sent to ED for further evaluation, any fever, chills, cough, shortness of breath, patient reports left foot wound for last month, she has been following by podiatry in [place], she finished antibiotic course last week, report wound is not improving, as well reports she has been out of her cardiac medications for last few weeks, and could not renew as she did not see her cardiologist (Dr. Terrence Dupont).  Patient denies any respiratory symptoms, no cough, no shortness of breath, reports she tested negative for COVID last week.  Her screening COVID-19 test came back positive in ED  -Presentation to ED, blood pressure was soft, white blood cell elevated at 20 K, but she had normal lactic acid, x-ray of infected foot with no evidence of osteomyelitis, hypotensive responded to fluid bolus, [facility] hospitalist consulted to admit.   Assessment & Plan:   Active Problems:   ESRD on dialysis ([place])   BMI 40.0-44.9, adult (HCC)   Atrial fibrillation (HCC)   Hyperlipidemia, unspecified   Infected wound   Cellulitis of fourth toe of left foot  1. Sepsis ruled out.  Left foot wound infection of left 4th/5th toes limited to skin breakdown - Unfortunately from the vascular studies  done she appears to have chronic ischemia of the left lower extremity with very poor vascular flow of the left foot.  I discussed with Dr. Arnoldo Morale and patient not ready to consider amputation at LLE at this time but she wants to have evaluation and opinion from her primary vascular surgeon Dr. Delana Meyer at [facility].  I spoke with Dr. Delana Meyer and he is agreeable to consult on patient if we transfer to [facility]. Transfer orders placed.  Awaiting for bed assignment at [facility].  Wound culture sent from purulent secretion from wound 1/23.  Continue broad spectrum antibiotics.  No evidence of osteomyelitis. Pt will need operative management as definitive treatment.   2. Leukocytosis - from left foot infection - Monitoring.  WBC rising. Discussed with surgery.  Feels secondary to ischemic left foot and wound infection.  Poor antibiotic penetration to infection due to poor circulation.  Awaiting bed placement at [facility] to see Dr. Delana Meyer.   3. ESRD on HD MWF - Pt will be due for HD 1/24. Nephrology team following.  4. Left foot and leg pain - secondary to chronic ischemia, added fentanyl IV for severe pain symptoms.     5. Abdominal pain - Pt had CT on admission which was unrevealing.  I suspect opioid induced constipation, added miralax, give dulcolax suppository, bladder scan done with scant urine seen. 6. Covid Infection - incidental finding - given high risk status treated with remdesivir x 3 day course.  7. CAD - continue aspirin and brilinta.  8. Chronic atrial fibrillation - Pt says she has  not been taking her amiodarone.  HR controlled. 9. Hypotension - has not been persistently low but has intermittently been low, she is on midodrine.  Continue to follow.  Holding BP meds temporarily with soft BPs.   10. Hypothyroidism - continue levothyroxine.    DVT prophylaxis: heparin Code Status: full  Family Communication: call to daughter was rejected x 3 Disposition: TBD (anticipate may need SNF if surgery is  involved)  Status is: Inpatient  Remains inpatient appropriate because:IV treatments appropriate due to intensity of illness or inability to take PO and Inpatient level of care appropriate due to severity of illness  Dispo: The patient is from: Home              Anticipated d/c is to: Home              Anticipated d/c date is: 3 days              Patient currently is not medically stable to d/c.  Consultants:   Surgery  Nephrology  Procedures:     Antimicrobials:    Subjective: Pt reports having abdominal pain.  Reports having had small BM yesterday and flatus.       Objective: Vitals:   12/19/20 0300 12/19/20 0400 12/19/20 0408 12/19/20 0722  BP: 110/70     Pulse: (!) 30 93  (!) 103  Resp: '13 12  14  '$ Temp:   97.7 F (36.5 C) 97.8 F (36.6 C)  TempSrc:   Axillary Oral  SpO2: 98% 99%  100%  Weight:   106.8 kg   Height:        Intake/Output Summary (Last 24 hours) at 12/19/2020 1029 Last data filed at 12/19/2020 0447 Gross per 24 hour  Intake 840 ml  Output --  Net 840 ml   Filed Weights   12/16/20 1505 12/18/20 0300 12/19/20 0408  Weight: 106.2 kg 107 kg 106.8 kg    Examination:  General exam: chronically ill appearing female, Appears calm and comfortable  Respiratory system: Clear to auscultation. Respiratory effort normal. Cardiovascular system: normal S1 & S2 heard. No JVD, murmurs, rubs, gallops or clicks. No pedal edema. Gastrointestinal system: Abdomen is nondistended, soft and nontender. No organomegaly or masses felt. Normal bowel sounds heard. Central nervous system: Alert and oriented. No focal neurological deficits. Extremities: cool left leg, left foot wound unchanged, small necrotic area seen on dorsal left foot, no drainage seen. No pedal pulses palpated in left foot.  Skin: No rashes, lesions or ulcers Psychiatry: Judgement and insight appear normal. Mood & affect appropriate.   Data Reviewed: I have personally reviewed following labs and  imaging studies  CBC: Recent Labs  Lab 12/14/20 1715 12/15/20 0613 12/16/20 0417 12/17/20 0507 12/18/20 0411 12/19/20 0826  WBC 20.6* 17.2* 15.4* 13.9* 16.3* 17.3*  NEUTROABS 16.9*  --   --  11.3* 13.4* 13.1*  HGB 11.4* 11.0* 10.6* 10.5* 10.6* 12.3  HCT 35.1* 34.3* 32.9* 31.6* 32.5* 38.2  MCV 99.4 99.4 100.0 96.9 97.6 99.5  PLT 167 152 150 158 157 145*    Basic Metabolic Panel: Recent Labs  Lab 12/15/20 0613 12/16/20 0417 12/17/20 0506 12/18/20 0411 12/19/20 0826  NA 130* 130* 133* 134* 131*  K 4.8 4.9 4.3 4.8 5.3*  CL 92* 93* 95* 95* 96*  CO2 21* 18* 22 21* 18*  GLUCOSE 86 79 85 82 89  BUN 43* 53* 32* 43* 59*  CREATININE 9.98* 11.27* 7.51* 9.09* 11.56*  CALCIUM 8.0* 7.9* 8.4* 8.5*  8.5*  PHOS  --  7.4* 5.7* 7.0* 7.5*    GFR: Estimated Creatinine Clearance: 6.7 mL/min (A) (by C-G formula based on SCr of 11.56 mg/dL (H)).  Liver Function Tests: Recent Labs  Lab 12/14/20 1715 12/16/20 0417 12/17/20 0506 12/18/20 0411 12/19/20 0826  AST 65*  --   --   --   --   ALT 27  --   --   --   --   ALKPHOS 50  --   --   --   --   BILITOT 0.6  --   --   --   --   PROT 7.1  --   --   --   --   ALBUMIN 3.5 3.0* 3.2* 3.3* 3.2*    CBG: No results for input(s): GLUCAP in the last 168 hours.  Recent Results (from the past 240 hour(s))  Blood culture (routine x 2)     Status: None   Collection Time: 12/14/20  2:18 PM   Specimen: Right Antecubital; Blood  Result Value Ref Range Status   Specimen Description RIGHT ANTECUBITAL  Final   Special Requests   Final    BOTTLES DRAWN AEROBIC AND ANAEROBIC Blood Culture results may not be optimal due to an inadequate volume of blood received in culture bottles   Culture   Final    NO GROWTH 5 DAYS Performed at [facility], [address] [place] [address], [place], [place] [address]    Report Status 12/19/2020 FINAL  Final  Resp Panel by RT-PCR (Flu A&B, Covid) Nasopharyngeal Swab     Status: Abnormal   Collection Time: 12/14/20  7:21 PM    Specimen: Nasopharyngeal Swab; Nasopharyngeal(NP) swabs in vial transport medium  Result Value Ref Range Status   SARS Coronavirus 2 by RT PCR POSITIVE (A) NEGATIVE Final    Comment: RESULT CALLED TO, READ BACK BY AND VERIFIED WITH: HARRY,A @ 2016 ON 12/14/20 BY JUW (NOTE) SARS-CoV-2 target nucleic acids are DETECTED.  The SARS-CoV-2 RNA is generally detectable in upper respiratory specimens during the acute phase of infection. Positive results are indicative of the presence of the identified virus, but do not rule out bacterial infection or co-infection with other pathogens not detected by the test. Clinical correlation with patient history and other diagnostic information is necessary to determine patient infection status. The expected result is Negative.  Fact Sheet for Patients: EntrepreneurPulse.com.au  Fact Sheet for Healthcare Providers: IncredibleEmployment.be  This test is not yet approved or cleared by the [place] FDA and  has been authorized for detection and/or diagnosis of SARS-CoV-2 by FDA under an Emergency Use Authorization (EUA).  This EUA will remain in effect (meaning this test can be  used) for the duration of  the COVID-19 declaration under Section 564(b)(1) of the Act, 21 U.S.C. section 360bbb-3(b)(1), unless the authorization is terminated or revoked sooner.     Influenza A by PCR NEGATIVE NEGATIVE Final   Influenza B by PCR NEGATIVE NEGATIVE Final    Comment: (NOTE) The Xpert Xpress SARS-CoV-2/FLU/RSV plus assay is intended as an aid in the diagnosis of influenza from Nasopharyngeal swab specimens and should not be used as a sole basis for treatment. Nasal washings and aspirates are unacceptable for Xpert Xpress SARS-CoV-2/FLU/RSV testing.  Fact Sheet for Patients: EntrepreneurPulse.com.au  Fact Sheet for Healthcare Providers: IncredibleEmployment.be  This test is not yet  approved or cleared by the [place] FDA and has been authorized for detection and/or diagnosis of SARS-CoV-2 by FDA under an  Emergency Use Authorization (EUA). This EUA will remain in effect (meaning this test can be used) for the duration of the COVID-19 declaration under Section 564(b)(1) of the Act, 21 U.S.C. section 360bbb-3(b)(1), unless the authorization is terminated or revoked.  Performed at [facility], [address] [place], [place] [address]   Blood culture (routine x 2)     Status: None   Collection Time: 12/14/20  9:25 PM   Specimen: Neck; Blood  Result Value Ref Range Status   Specimen Description NECK  Final   Special Requests   Final    BOTTLES DRAWN AEROBIC AND ANAEROBIC Blood Culture adequate volume   Culture   Final    NO GROWTH 5 DAYS Performed at [facility], [address] [place] [address] [place],  [address]    Report Status 12/19/2020 FINAL  Final     Radiology Studies: No results found. Scheduled Meds: . aspirin EC  81 mg Oral Daily  . calcitRIOL  0.75 mcg Oral Q M,W,F-HD  . Chlorhexidine Gluconate Cloth  6 each Topical Daily  . Chlorhexidine Gluconate Cloth  6 each Topical Q0600  . Chlorhexidine Gluconate Cloth  6 each Topical Q0600  . collagenase   Topical Daily  . gabapentin  100 mg Oral QHS  . heparin  5,000 Units Subcutaneous Q8H  . levothyroxine  300 mcg Oral QHS  . midodrine  10 mg Oral TID WC  . nystatin   Topical BID  . pantoprazole  40 mg Oral QHS  . polyethylene glycol  17 g Oral BID  . sevelamer carbonate  3,200 mg Oral TID WC  . simethicone  80 mg Oral QID  . ticagrelor  90 mg Oral BID   Continuous Infusions: . sodium chloride    . sodium chloride    . ceFEPime (MAXIPIME) IV 2 g (12/16/20 1800)  . metronidazole 500 mg (12/19/20 0616)  . vancomycin 1,000 mg (12/16/20 1700)    LOS: 5 days   Time spent: 35 mins   Bazil Dhanani Wynetta Emery, MD How to contact the [facility] Attending or Consulting provider [place] or covering provider during  after hours [place], for this patient?  1. Check the care team in [facility] and look for a) attending/consulting TRH provider listed and b) the [facility] team listed 2. Log into [email or web address] and use [place]'s universal password to access. If you do not have the password, please contact the hospital operator. 3. Locate the [facility] provider you are looking for under [facility] and page to a number that you can be directly reached. 4. If you still have difficulty reaching the provider, please page the [facility] (Director on Call) for the Hospitalists listed on amion for assistance.  12/19/2020, 10:29 AM

## 2020-12-19 NOTE — Evaluation (Signed)
Occupational Therapy Evaluation Patient Details Name: Lori [name] MRN: [id number] DOB: May 27, 1971 Today's Date: 12/19/2020    History of Present Illness Lori [name]  is a 50 y.o. female, past medical history of CAD status post stent to LAD and 2020, ESRD on hemodialysis Monday Wednesday Friday, followed by Dr. Raquel Sarna, hypertension, hypothyroidism, hyperparathyroidism, GERD.  -Patient presents to ED status post fall, head pain, back pain and generalized weakness, patient reported mechanical fall as she was getting out of the [place] for her hemodialysis, she does report a fall with head trauma, denies any syncope or near syncope, for very generalized weak, fatigue, reports she did not finish her dialysis due to weakness, and she was sent to ED for further evaluation, any fever, chills, cough, shortness of breath, patient reports left foot wound for last month, she has been following by podiatry in [place], she finished antibiotic course last week, report wound is not improving, as well reports she has been out of her cardiac medications for last few weeks, and could not renew as she did not see her cardiologist (Dr. Terrence Dupont).  Patient denies any respiratory symptoms, no cough, no shortness of breath, reports she tested negative for COVID last week.  Her screening COVID-19 test came back positive in ED   -Presentation to ED, blood pressure was soft, white blood cell elevated at 20 K, but she had normal lactic acid, x-ray of infected foot with no evidence of osteomyelitis, hypotensive responded to fluid bolus, [facility] hospitalist consulted to admit.   Clinical Impression   Pt in bed upon therapy arrival and agreeable to participate in OT evaluation. Patient demonstrates full BUE A/ROM and strength although due to her length of stay she requires increased physical assistance to complete self care ADL tasks. Would benefit from continued skilled OT services acutely and at discharge. PT has recommended SNF at  discharge when patient was evaluated. Pt reports that she is seeing a surgeon regarding her left foot and she is unsure what the plan will be. If patient does not go to SNF at discharge then [place] would be beneficial.     Follow Up Recommendations  SNF    Equipment Recommendations  Tub/shower seat;Wheelchair (measurements OT);Wheelchair cushion (measurements OT)       Precautions / Restrictions Precautions Precautions: Fall Precaution Comments: wound left foot Restrictions Weight Bearing Restrictions: No      Mobility Bed Mobility     General bed mobility comments: Not completed during evaluation although patient did require min-mod assist when working with PT 4 days ago    Transfers Overall transfer level:  (See PT note for transfer level)          ADL either performed or assessed with clinical judgement   ADL Overall ADL's : Needs assistance/impaired     Grooming: Wash/dry hands;Wash/dry face;Oral care;Brushing hair;Set up;Bed level   Upper Body Bathing: Bed level;Set up   Lower Body Bathing: Maximal assistance;Bed level   Upper Body Dressing : Minimal assistance;Bed level   Lower Body Dressing: Maximal assistance;Bed level     Toilet Transfer Details (indicate cue type and reason): Not completed this date. Toileting- Clothing Manipulation and Hygiene: Maximal assistance;Sitting/lateral lean Toileting - Clothing Manipulation Details (indicate cue type and reason): clinical judgement used to assess level of assist needed.             Vision Baseline Vision/History: Wears glasses Wears Glasses: At all times Patient Visual Report: No change from baseline  Pertinent Vitals/Pain Pain Assessment: No/denies pain (No report of pain while resting comfortably in bed.) Pain Score: 0-No pain     Hand Dominance Right   Extremity/Trunk Assessment Upper Extremity Assessment Upper Extremity Assessment: Overall WFL for tasks assessed    Lower Extremity Assessment Lower Extremity Assessment: Defer to PT evaluation       Communication Communication Communication: No difficulties   Cognition Arousal/Alertness: Awake/alert Behavior During Therapy: WFL for tasks assessed/performed Overall Cognitive Status: Within Functional Limits for tasks assessed                     Home Living Family/patient expects to be discharged to:: Private residence Living Arrangements: Spouse/significant other Available Help at Discharge: Family Type of Home: House Home Access: Stairs to enter Technical brewer of Steps: 2 Entrance Stairs-Rails: Right Home Layout: One level         Bathroom Toilet: Standard Bathroom Accessibility: Yes   Home Equipment: Programme researcher, broadcasting/film/video - 2 wheels;Cane - single point          Prior Functioning/Environment Level of Independence: Independent with assistive device(s)        Comments: patient uses cane in the home and transport chair when out in the community. Husband completed meal prep, housekeeping tasks, and the finances.        OT Problem List: Pain;Decreased activity tolerance;Impaired balance (sitting and/or standing)      OT Treatment/Interventions: Self-care/ADL training;Therapeutic exercise;Therapeutic activities;Energy conservation;DME and/or AE instruction;Manual therapy;Modalities;Balance training;Patient/family education    OT Goals(Current goals can be found in the care plan section) Acute Rehab OT Goals Patient Stated Goal: return home with family to assist OT Goal Formulation: With patient Time For Goal Achievement: 01/02/21 Potential to Achieve Goals: Good  OT Frequency: Min 2X/week    AM-PAC OT "6 Clicks" Daily Activity     Outcome Measure Help from another person eating meals?: None Help from another person taking care of personal grooming?: A Little Help from another person toileting, which includes using toliet, bedpan, or urinal?: A Lot Help from  another person bathing (including washing, rinsing, drying)?: A Lot Help from another person to put on and taking off regular upper body clothing?: A Little Help from another person to put on and taking off regular lower body clothing?: A Lot 6 Click Score: 16   End of Session Equipment Utilized During Treatment: Oxygen  Activity Tolerance: Patient tolerated treatment well Patient left: in bed;with call bell/phone within reach;with bed alarm set  OT Visit Diagnosis: History of falling (Z91.81);Pain Pain - Right/Left: Left Pain - part of body: Ankle and joints of foot                Time: 0850-0910 OT Time Calculation (min): 20 min Charges:  OT General Charges $OT Visit: 1 Visit OT Evaluation $OT Eval Low Complexity: [place], OTR/L,CBIS  [phone]  Brieann Osinski, Clarene Duke 12/19/2020, 9:24 AM

## 2020-12-19 NOTE — Procedures (Signed)
   HEMODIALYSIS TREATMENT NOTE:  Uneventful 3.5 h low-heparin HD completed via RIJ TDC. Goal nearly met: UF rate was decreased for soft pressures.  Net UF 1.8L.  All blood was returned.  Rockwell Alexandria, RN

## 2020-12-19 NOTE — Progress Notes (Signed)
Patient ID: Lori [name], female   DOB: May 18, 1971, 50 y.o.   MRN: [id number] S:No events overnight. O:BP 110/70   Pulse (!) 103   Temp 97.8 F (36.6 C) (Oral)   Resp 14   Ht '5\' 3"'$  (1.6 m)   Wt 106.8 kg   SpO2 100%   BMI 41.71 kg/m   Intake/Output Summary (Last 24 hours) at 12/19/2020 0901 Last data filed at 12/19/2020 0447 Gross per 24 hour  Intake 840 ml  Output -  Net 840 ml   Intake/Output: I/O last 3 completed shifts: In: 1260 [P.O.:760; IV Piggyback:500] Out: -   Intake/Output this shift:  No intake/output data recorded. Weight change: -0.2 kg Physical exam: unable to complete due to COVID + status.  In order to preserve PPE equipment and to minimize exposure to providers.  Notes from other caregivers reviewed   Recent Labs  Lab 12/14/20 1715 12/15/20 0613 12/16/20 0417 12/17/20 0506 12/18/20 0411  NA 132* 130* 130* 133* 134*  K 5.1 4.8 4.9 4.3 4.8  CL 94* 92* 93* 95* 95*  CO2 21* 21* 18* 22 21*  GLUCOSE 80 86 79 85 82  BUN 40* 43* 53* 32* 43*  CREATININE 9.68* 9.98* 11.27* 7.51* 9.09*  ALBUMIN 3.5  --  3.0* 3.2* 3.3*  CALCIUM 8.2* 8.0* 7.9* 8.4* 8.5*  PHOS  --   --  7.4* 5.7* 7.0*  AST 65*  --   --   --   --   ALT 27  --   --   --   --    Liver Function Tests: Recent Labs  Lab 12/14/20 1715 12/16/20 0417 12/17/20 0506 12/18/20 0411  AST 65*  --   --   --   ALT 27  --   --   --   ALKPHOS 50  --   --   --   BILITOT 0.6  --   --   --   PROT 7.1  --   --   --   ALBUMIN 3.5 3.0* 3.2* 3.3*   No results for input(s): LIPASE, AMYLASE in the last 168 hours. No results for input(s): AMMONIA in the last 168 hours. CBC: Recent Labs  Lab 12/14/20 1715 12/15/20 0613 12/16/20 0417 12/17/20 0507 12/18/20 0411  WBC 20.6* 17.2* 15.4* 13.9* 16.3*  NEUTROABS 16.9*  --   --  11.3* 13.4*  HGB 11.4* 11.0* 10.6* 10.5* 10.6*  HCT 35.1* 34.3* 32.9* 31.6* 32.5*  MCV 99.4 99.4 100.0 96.9 97.6  PLT 167 152 150 158 157   Cardiac Enzymes: No results for input(s):  CKTOTAL, CKMB, CKMBINDEX, TROPONINI in the last 168 hours. CBG: No results for input(s): GLUCAP in the last 168 hours.  Iron Studies: No results for input(s): IRON, TIBC, TRANSFERRIN, FERRITIN in the last 72 hours. Studies/Results: No results found. Marland Kitchen aspirin EC  81 mg Oral Daily  . bisacodyl  10 mg Rectal Once  . calcitRIOL  0.75 mcg Oral Q M,W,F-HD  . Chlorhexidine Gluconate Cloth  6 each Topical Daily  . Chlorhexidine Gluconate Cloth  6 each Topical Q0600  . Chlorhexidine Gluconate Cloth  6 each Topical Q0600  . collagenase   Topical Daily  . gabapentin  100 mg Oral QHS  . heparin  5,000 Units Subcutaneous Q8H  . levothyroxine  300 mcg Oral QHS  . midodrine  10 mg Oral TID WC  . nystatin   Topical BID  . pantoprazole  40 mg Oral QHS  . polyethylene glycol  17 g Oral BID  . sevelamer carbonate  3,200 mg Oral TID WC  . simethicone  80 mg Oral QID  . ticagrelor  90 mg Oral BID    BMET    Component Value Date/Time   NA 134 (L) 12/18/2020 0411   NA 138 03/26/2014 1425   K 4.8 12/18/2020 0411   K 3.5 03/26/2014 1425   CL 95 (L) 12/18/2020 0411   CL 99 03/26/2014 1425   CO2 21 (L) 12/18/2020 0411   CO2 26 03/26/2014 1425   GLUCOSE 82 12/18/2020 0411   GLUCOSE 79 03/26/2014 1425   BUN 43 (H) 12/18/2020 0411   BUN 36 (H) 03/26/2014 1425   CREATININE 9.09 (H) 12/18/2020 0411   CREATININE 9.54 (H) 03/26/2014 1425   CALCIUM 8.5 (L) 12/18/2020 0411   CALCIUM 7.7 (L) 03/26/2014 1425   GFRNONAA 5 (L) 12/18/2020 0411   GFRNONAA 4 (L) 03/26/2014 1425   GFRAA 7 (L) 10/22/2019 0240   GFRAA 5 (L) 03/26/2014 1425   CBC    Component Value Date/Time   WBC 16.3 (H) 12/18/2020 0411   RBC 3.33 (L) 12/18/2020 0411   HGB 10.6 (L) 12/18/2020 0411   HGB 10.0 (L) 03/26/2014 1842   HCT 32.5 (L) 12/18/2020 0411   HCT 32.0 (L) 03/26/2014 1842   PLT 157 12/18/2020 0411   PLT 332 03/26/2014 1425   MCV 97.6 12/18/2020 0411   MCV 90 03/26/2014 1425   MCH 31.8 12/18/2020 0411   MCHC 32.6  12/18/2020 0411   RDW 16.5 (H) 12/18/2020 0411   RDW 15.3 (H) 03/26/2014 1425   LYMPHSABS 0.9 12/18/2020 0411   LYMPHSABS 1.0 03/26/2014 1425   MONOABS 1.0 12/18/2020 0411   MONOABS 0.8 03/26/2014 1425   EOSABS 0.4 12/18/2020 0411   EOSABS 0.3 03/26/2014 1425   BASOSABS 0.1 12/18/2020 0411   BASOSABS 0.2 (H) 03/26/2014 1425   Dialyzes [facility]- MWF- 4 hoursEDW 101.5. HD Bath2/2.5, Dialyzer180, Heparinyes- 10,000. AccessTDC.Calcitriol 0.75 TIW 1/17 hgb 11.3, last calc 10, last K 5.0, alb 4.2, phos 9.9, pth 404  Assessment/Plan:  1. Sepsis- blood cultures negative, incidental covid-19 + test.  Primary service feels that elevated WBC was due to left foot wound and chronic ischemic changes.  Pt not ready for amputation and wants to discuss with her vascular surgeon, Dr. Delana Meyer.  Will transfer to [facility] for evaluation once bed available. 2. covid-19 +- pt asymptomatic but treated with remdesivir x 3 days per primary svc. 3. ESRD- continue with HD on MWF schedule 4. HTN- improved hypotension. 5. Anemia of ESRD 6. CMBD- continue with binders and vit D.  Donetta Potts, MD [facility] [phone]

## 2020-12-19 NOTE — Progress Notes (Signed)
Physical Therapy Treatment Patient Details Name: Lori [name] MRN: [id number] DOB: 01-05-1971 Today's Date: 12/19/2020    History of Present Illness Lori [name]  is a 50 y.o. female, past medical history of CAD status post stent to LAD and 2020, ESRD on hemodialysis Monday Wednesday Friday, followed by Dr. Raquel Sarna, hypertension, hypothyroidism, hyperparathyroidism, GERD.  -Patient presents to ED status post fall, head pain, back pain and generalized weakness, patient reported mechanical fall as she was getting out of the [place] for her hemodialysis, she does report a fall with head trauma, denies any syncope or near syncope, for very generalized weak, fatigue, reports she did not finish her dialysis due to weakness, and she was sent to ED for further evaluation, any fever, chills, cough, shortness of breath, patient reports left foot wound for last month, she has been following by podiatry in [place], she finished antibiotic course last week, report wound is not improving, as well reports she has been out of her cardiac medications for last few weeks, and could not renew as she did not see her cardiologist (Dr. Terrence Dupont).  Patient denies any respiratory symptoms, no cough, no shortness of breath, reports she tested negative for COVID last week.  Her screening COVID-19 test came back positive in ED   -Presentation to ED, blood pressure was soft, white blood cell elevated at 20 K, but she had normal lactic acid, x-ray of infected foot with no evidence of osteomyelitis, hypotensive responded to fluid bolus, [facility] hospitalist consulted to admit.    PT Comments    Patient demonstrates improvement for sitting up at bedside and scooting laterally in bed requiring frequent rest breaks, unable to stand due to c/o severe pain left foot with any pressure and good return for repositioning herself when put back to bed.  Patient will benefit from continued physical therapy in hospital and recommended venue below to  increase strength, balance, endurance for safe ADLs and gait.   Follow Up Recommendations  SNF     Equipment Recommendations  None recommended by PT    Recommendations for Other Services       Precautions / Restrictions Precautions Precautions: Fall Precaution Comments: wound left foot Restrictions Weight Bearing Restrictions: No    Mobility  Bed Mobility Overal bed mobility: Needs Assistance Bed Mobility: Supine to Sit;Sit to Supine     Supine to sit: Min guard;Min assist Sit to supine: Min guard;Min assist   General bed mobility comments: very slow labored movement with c/o severe pain left foot with movement  Transfers                    Ambulation/Gait                 Stairs             Wheelchair Mobility    Modified Rankin (Stroke Patients Only)       Balance Overall balance assessment: Needs assistance Sitting-balance support: Feet supported;No upper extremity supported Sitting balance-Leahy Scale: Fair Sitting balance - Comments: fair/good seated at EOB                                    Cognition Arousal/Alertness: Awake/alert Behavior During Therapy: WFL for tasks assessed/performed Overall Cognitive Status: Within Functional Limits for tasks assessed  Exercises General Exercises - Lower Extremity Ankle Circles/Pumps: Seated;Strengthening;AROM;15 reps Long Arc Quad: Seated;AROM;Strengthening;10 reps Hip Flexion/Marching: Seated;AROM;Strengthening;10 reps    General Comments        Pertinent Vitals/Pain Pain Assessment: Faces Faces Pain Scale: Hurts whole lot Pain Location: left foot with pressure or movement Pain Descriptors / Indicators: Aching;Sore;Grimacing;Guarding Pain Intervention(s): Limited activity within patient's tolerance;Monitored during session;Repositioned    Home Living                      Prior Function             PT Goals (current goals can now be found in the care plan section) Acute Rehab PT Goals Patient Stated Goal: return home with family to assist PT Goal Formulation: With patient Time For Goal Achievement: 12/29/20 Potential to Achieve Goals: Good Progress towards PT goals: Progressing toward goals    Frequency    Min 3X/week      PT Plan      Co-evaluation              AM-PAC PT "6 Clicks" Mobility   Outcome Measure  Help needed turning from your back to your side while in a flat bed without using bedrails?: A Little Help needed moving from lying on your back to sitting on the side of a flat bed without using bedrails?: A Little Help needed moving to and from a bed to a chair (including a wheelchair)?: Total Help needed standing up from a chair using your arms (e.g., wheelchair or bedside chair)?: Total Help needed to walk in hospital room?: Total Help needed climbing 3-5 steps with a railing? : Total 6 Click Score: 10    End of Session   Activity Tolerance: Patient tolerated treatment well;Patient limited by fatigue;Patient limited by pain Patient left: in bed;with call bell/phone within reach Nurse Communication: Mobility status PT Visit Diagnosis: Unsteadiness on feet (R26.81);Other abnormalities of gait and mobility (R26.89);Muscle weakness (generalized) (M62.81)     Time: [id number] PT Time Calculation (min) (ACUTE ONLY): 26 min  Charges:  $Therapeutic Exercise: 8-22 mins $Therapeutic Activity: 8-22 mins                     3:58 PM, 12/19/20 Lonell Grandchild, MPT Physical Therapist with [facility] [phone] office [phone] mobile phone

## 2020-12-19 NOTE — Plan of Care (Signed)
  Problem: Acute Rehab OT Goals (only OT should resolve) Goal: Pt. Will Perform Grooming Flowsheets (Taken 12/19/2020 0928) Pt Will Perform Grooming:  with modified independence  sitting Goal: Pt. Will Perform Upper Body Bathing Flowsheets (Taken 12/19/2020 0928) Pt Will Perform Upper Body Bathing:  with modified independence  sitting Goal: Pt. Will Perform Lower Body Bathing Flowsheets (Taken 12/19/2020 0928) Pt Will Perform Lower Body Bathing:  with min assist  sit to/from stand  sitting/lateral leans Goal: Pt. Will Perform Upper Body Dressing Flowsheets (Taken 12/19/2020 0928) Pt Will Perform Upper Body Dressing:  with modified independence  sitting Goal: Pt. Will Perform Lower Body Dressing Flowsheets (Taken 12/19/2020 0928) Pt Will Perform Lower Body Dressing:  with min assist  sit to/from stand  sitting/lateral leans Goal: Pt. Will Transfer To Toilet Flowsheets (Taken 12/19/2020 [phone]) Pt Will Transfer to Toilet:  with min assist  bedside commode  stand pivot transfer Goal: Pt. Will Perform Toileting-Clothing Manipulation Flowsheets (Taken 12/19/2020 0928) Pt Will Perform Toileting - Clothing Manipulation and hygiene:  with min assist  sitting/lateral leans  sit to/from stand

## 2020-12-19 NOTE — Progress Notes (Signed)
Pharmacy Antibiotic Note  Lori [name] is a 50 y.o. female admitted on 12/14/2020 with wound infection.  Pharmacy has been consulted for Vancomycin and Cefepime dosing.  Plan: Continue Vancomycin 1000 mg IV every MWF w/ HD. Continue Cefepime 2000 mg IV every MWF w/ HD. Monitor labs, c/s, and vanco level as indicated.  Height: '5\' 3"'$  (160 cm) Weight: 106.8 kg (235 lb 7.2 oz) IBW/kg (Calculated) : 52.4  Temp (24hrs), Avg:97.6 F (36.4 C), Min:97.1 F (36.2 C), Max:97.8 F (36.6 C)  Recent Labs  Lab 12/14/20 1618 12/14/20 1715 12/14/20 1715 12/15/20 0613 12/16/20 0417 12/17/20 0506 12/17/20 0507 12/18/20 0411 12/19/20 0826  WBC  --  20.6*   < > 17.2* 15.4*  --  13.9* 16.3* 17.3*  CREATININE  --  9.68*   < > 9.98* 11.27* 7.51*  --  9.09* 11.56*  LATICACIDVEN 2.0* 1.8  --   --   --   --   --   --   --    < > = values in this interval not displayed.    Estimated Creatinine Clearance: 6.7 mL/min (A) (by C-G formula based on SCr of 11.56 mg/dL (H)).    Allergies  Allergen Reactions  . Activase [Alteplase] Shortness Of Breath  . Bee Pollen Anaphylaxis  . Warfarin Sodium Nausea And Vomiting and Rash    Vanco 1/19>> Cefepime 1/19>> Flagyl 1/19>> Remdesivir 1/19>>3 total doses for incidental finding   1/19 Bcx: ng 1/23 Wound Cx: pending COVID +  Thank you for allowing pharmacy to be a part of this patient's care.  Ramond Craver 12/19/2020 10:55 AM

## 2020-12-20 ENCOUNTER — Inpatient Hospital Stay
Admission: AD | Admit: 2020-12-20 | Discharge: 2021-01-10 | DRG: 270 | Disposition: A | Discharge status: Skilled Nursing Facility | Payer: Medicare Other | Source: Other Acute Inpatient Hospital | Attending: Internal Medicine | Admitting: Internal Medicine

## 2020-12-20 ENCOUNTER — Other Ambulatory Visit: Payer: Self-pay

## 2020-12-20 ENCOUNTER — Encounter: Payer: Self-pay | Admitting: Internal Medicine

## 2020-12-20 DIAGNOSIS — K219 Gastro-esophageal reflux disease without esophagitis: Secondary | ICD-10-CM | POA: Diagnosis not present

## 2020-12-20 DIAGNOSIS — Y838 Other surgical procedures as the cause of abnormal reaction of the patient, or of later complication, without mention of misadventure at the time of the procedure: Secondary | ICD-10-CM | POA: Diagnosis present

## 2020-12-20 DIAGNOSIS — U071 COVID-19: Secondary | ICD-10-CM | POA: Diagnosis present

## 2020-12-20 DIAGNOSIS — E039 Hypothyroidism, unspecified: Secondary | ICD-10-CM | POA: Diagnosis present

## 2020-12-20 DIAGNOSIS — K625 Hemorrhage of anus and rectum: Secondary | ICD-10-CM | POA: Diagnosis not present

## 2020-12-20 DIAGNOSIS — R2689 Other abnormalities of gait and mobility: Secondary | ICD-10-CM | POA: Diagnosis not present

## 2020-12-20 DIAGNOSIS — R5381 Other malaise: Secondary | ICD-10-CM | POA: Diagnosis not present

## 2020-12-20 DIAGNOSIS — I998 Other disorder of circulatory system: Secondary | ICD-10-CM | POA: Diagnosis not present

## 2020-12-20 DIAGNOSIS — Z6841 Body Mass Index (BMI) 40.0 and over, adult: Secondary | ICD-10-CM | POA: Diagnosis not present

## 2020-12-20 DIAGNOSIS — Z9862 Peripheral vascular angioplasty status: Secondary | ICD-10-CM | POA: Diagnosis not present

## 2020-12-20 DIAGNOSIS — I4819 Other persistent atrial fibrillation: Secondary | ICD-10-CM | POA: Diagnosis present

## 2020-12-20 DIAGNOSIS — I70234 Atherosclerosis of native arteries of right leg with ulceration of heel and midfoot: Secondary | ICD-10-CM | POA: Diagnosis not present

## 2020-12-20 DIAGNOSIS — J45909 Unspecified asthma, uncomplicated: Secondary | ICD-10-CM | POA: Diagnosis present

## 2020-12-20 DIAGNOSIS — B952 Enterococcus as the cause of diseases classified elsewhere: Secondary | ICD-10-CM | POA: Diagnosis not present

## 2020-12-20 DIAGNOSIS — Z8616 Personal history of COVID-19: Secondary | ICD-10-CM | POA: Diagnosis not present

## 2020-12-20 DIAGNOSIS — I70221 Atherosclerosis of native arteries of extremities with rest pain, right leg: Secondary | ICD-10-CM | POA: Diagnosis not present

## 2020-12-20 DIAGNOSIS — Z888 Allergy status to other drugs, medicaments and biological substances status: Secondary | ICD-10-CM

## 2020-12-20 DIAGNOSIS — N2581 Secondary hyperparathyroidism of renal origin: Secondary | ICD-10-CM | POA: Diagnosis not present

## 2020-12-20 DIAGNOSIS — Z841 Family history of disorders of kidney and ureter: Secondary | ICD-10-CM

## 2020-12-20 DIAGNOSIS — Z79890 Hormone replacement therapy (postmenopausal): Secondary | ICD-10-CM

## 2020-12-20 DIAGNOSIS — L97519 Non-pressure chronic ulcer of other part of right foot with unspecified severity: Secondary | ICD-10-CM | POA: Diagnosis present

## 2020-12-20 DIAGNOSIS — Z992 Dependence on renal dialysis: Secondary | ICD-10-CM | POA: Diagnosis not present

## 2020-12-20 DIAGNOSIS — Z7189 Other specified counseling: Secondary | ICD-10-CM | POA: Diagnosis not present

## 2020-12-20 DIAGNOSIS — Z79899 Other long term (current) drug therapy: Secondary | ICD-10-CM | POA: Diagnosis not present

## 2020-12-20 DIAGNOSIS — R252 Cramp and spasm: Secondary | ICD-10-CM | POA: Diagnosis not present

## 2020-12-20 DIAGNOSIS — I2510 Atherosclerotic heart disease of native coronary artery without angina pectoris: Secondary | ICD-10-CM | POA: Diagnosis not present

## 2020-12-20 DIAGNOSIS — I70262 Atherosclerosis of native arteries of extremities with gangrene, left leg: Secondary | ICD-10-CM | POA: Diagnosis not present

## 2020-12-20 DIAGNOSIS — E875 Hyperkalemia: Secondary | ICD-10-CM | POA: Diagnosis not present

## 2020-12-20 DIAGNOSIS — I96 Gangrene, not elsewhere classified: Secondary | ICD-10-CM | POA: Diagnosis not present

## 2020-12-20 DIAGNOSIS — I480 Paroxysmal atrial fibrillation: Secondary | ICD-10-CM | POA: Diagnosis not present

## 2020-12-20 DIAGNOSIS — Z87892 Personal history of anaphylaxis: Secondary | ICD-10-CM

## 2020-12-20 DIAGNOSIS — J9601 Acute respiratory failure with hypoxia: Secondary | ICD-10-CM | POA: Diagnosis not present

## 2020-12-20 DIAGNOSIS — L03116 Cellulitis of left lower limb: Secondary | ICD-10-CM | POA: Diagnosis not present

## 2020-12-20 DIAGNOSIS — T82856A Stenosis of peripheral vascular stent, initial encounter: Secondary | ICD-10-CM | POA: Diagnosis present

## 2020-12-20 DIAGNOSIS — E063 Autoimmune thyroiditis: Secondary | ICD-10-CM | POA: Diagnosis present

## 2020-12-20 DIAGNOSIS — Z833 Family history of diabetes mellitus: Secondary | ICD-10-CM

## 2020-12-20 DIAGNOSIS — Z8249 Family history of ischemic heart disease and other diseases of the circulatory system: Secondary | ICD-10-CM

## 2020-12-20 DIAGNOSIS — R262 Difficulty in walking, not elsewhere classified: Secondary | ICD-10-CM | POA: Diagnosis not present

## 2020-12-20 DIAGNOSIS — N186 End stage renal disease: Secondary | ICD-10-CM | POA: Diagnosis present

## 2020-12-20 DIAGNOSIS — Z955 Presence of coronary angioplasty implant and graft: Secondary | ICD-10-CM

## 2020-12-20 DIAGNOSIS — R001 Bradycardia, unspecified: Secondary | ICD-10-CM | POA: Diagnosis not present

## 2020-12-20 DIAGNOSIS — I70263 Atherosclerosis of native arteries of extremities with gangrene, bilateral legs: Secondary | ICD-10-CM | POA: Diagnosis not present

## 2020-12-20 DIAGNOSIS — Z7982 Long term (current) use of aspirin: Secondary | ICD-10-CM

## 2020-12-20 DIAGNOSIS — M79672 Pain in left foot: Secondary | ICD-10-CM | POA: Diagnosis not present

## 2020-12-20 DIAGNOSIS — D509 Iron deficiency anemia, unspecified: Secondary | ICD-10-CM | POA: Diagnosis not present

## 2020-12-20 DIAGNOSIS — Z91030 Bee allergy status: Secondary | ICD-10-CM | POA: Diagnosis not present

## 2020-12-20 DIAGNOSIS — I4891 Unspecified atrial fibrillation: Secondary | ICD-10-CM | POA: Diagnosis present

## 2020-12-20 DIAGNOSIS — M549 Dorsalgia, unspecified: Secondary | ICD-10-CM | POA: Diagnosis not present

## 2020-12-20 DIAGNOSIS — E213 Hyperparathyroidism, unspecified: Secondary | ICD-10-CM | POA: Diagnosis present

## 2020-12-20 DIAGNOSIS — E441 Mild protein-calorie malnutrition: Secondary | ICD-10-CM | POA: Diagnosis not present

## 2020-12-20 DIAGNOSIS — R234 Changes in skin texture: Secondary | ICD-10-CM

## 2020-12-20 DIAGNOSIS — S98922A Partial traumatic amputation of left foot, level unspecified, initial encounter: Secondary | ICD-10-CM | POA: Diagnosis not present

## 2020-12-20 DIAGNOSIS — Z8673 Personal history of transient ischemic attack (TIA), and cerebral infarction without residual deficits: Secondary | ICD-10-CM

## 2020-12-20 DIAGNOSIS — K30 Functional dyspepsia: Secondary | ICD-10-CM | POA: Diagnosis not present

## 2020-12-20 DIAGNOSIS — G8929 Other chronic pain: Secondary | ICD-10-CM | POA: Diagnosis not present

## 2020-12-20 DIAGNOSIS — Z87891 Personal history of nicotine dependence: Secondary | ICD-10-CM

## 2020-12-20 DIAGNOSIS — K5900 Constipation, unspecified: Secondary | ICD-10-CM | POA: Diagnosis not present

## 2020-12-20 DIAGNOSIS — I9581 Postprocedural hypotension: Secondary | ICD-10-CM | POA: Diagnosis not present

## 2020-12-20 DIAGNOSIS — M6281 Muscle weakness (generalized): Secondary | ICD-10-CM | POA: Diagnosis not present

## 2020-12-20 DIAGNOSIS — D638 Anemia in other chronic diseases classified elsewhere: Secondary | ICD-10-CM | POA: Diagnosis not present

## 2020-12-20 DIAGNOSIS — I120 Hypertensive chronic kidney disease with stage 5 chronic kidney disease or end stage renal disease: Secondary | ICD-10-CM | POA: Diagnosis not present

## 2020-12-20 DIAGNOSIS — M62838 Other muscle spasm: Secondary | ICD-10-CM | POA: Diagnosis not present

## 2020-12-20 DIAGNOSIS — I214 Non-ST elevation (NSTEMI) myocardial infarction: Secondary | ICD-10-CM | POA: Diagnosis not present

## 2020-12-20 DIAGNOSIS — I252 Old myocardial infarction: Secondary | ICD-10-CM

## 2020-12-20 DIAGNOSIS — Z4781 Encounter for orthopedic aftercare following surgical amputation: Secondary | ICD-10-CM | POA: Diagnosis not present

## 2020-12-20 DIAGNOSIS — S91301A Unspecified open wound, right foot, initial encounter: Secondary | ICD-10-CM | POA: Diagnosis not present

## 2020-12-20 DIAGNOSIS — A419 Sepsis, unspecified organism: Secondary | ICD-10-CM | POA: Diagnosis not present

## 2020-12-20 DIAGNOSIS — L03032 Cellulitis of left toe: Principal | ICD-10-CM

## 2020-12-20 DIAGNOSIS — Z8639 Personal history of other endocrine, nutritional and metabolic disease: Secondary | ICD-10-CM | POA: Diagnosis not present

## 2020-12-20 DIAGNOSIS — I739 Peripheral vascular disease, unspecified: Secondary | ICD-10-CM

## 2020-12-20 DIAGNOSIS — Z515 Encounter for palliative care: Secondary | ICD-10-CM | POA: Diagnosis not present

## 2020-12-20 DIAGNOSIS — R6521 Severe sepsis with septic shock: Secondary | ICD-10-CM | POA: Diagnosis not present

## 2020-12-20 DIAGNOSIS — Z419 Encounter for procedure for purposes other than remedying health state, unspecified: Secondary | ICD-10-CM

## 2020-12-20 DIAGNOSIS — G8918 Other acute postprocedural pain: Secondary | ICD-10-CM | POA: Diagnosis not present

## 2020-12-20 DIAGNOSIS — E785 Hyperlipidemia, unspecified: Secondary | ICD-10-CM | POA: Diagnosis not present

## 2020-12-20 DIAGNOSIS — E1142 Type 2 diabetes mellitus with diabetic polyneuropathy: Secondary | ICD-10-CM | POA: Diagnosis not present

## 2020-12-20 DIAGNOSIS — B964 Proteus (mirabilis) (morganii) as the cause of diseases classified elsewhere: Secondary | ICD-10-CM | POA: Diagnosis present

## 2020-12-20 DIAGNOSIS — D631 Anemia in chronic kidney disease: Secondary | ICD-10-CM | POA: Diagnosis present

## 2020-12-20 DIAGNOSIS — R279 Unspecified lack of coordination: Secondary | ICD-10-CM | POA: Diagnosis not present

## 2020-12-20 DIAGNOSIS — R946 Abnormal results of thyroid function studies: Secondary | ICD-10-CM | POA: Diagnosis present

## 2020-12-20 DIAGNOSIS — Z9181 History of falling: Secondary | ICD-10-CM

## 2020-12-20 DIAGNOSIS — I25118 Atherosclerotic heart disease of native coronary artery with other forms of angina pectoris: Secondary | ICD-10-CM | POA: Diagnosis not present

## 2020-12-20 DIAGNOSIS — E538 Deficiency of other specified B group vitamins: Secondary | ICD-10-CM | POA: Diagnosis present

## 2020-12-20 DIAGNOSIS — L089 Local infection of the skin and subcutaneous tissue, unspecified: Secondary | ICD-10-CM | POA: Diagnosis not present

## 2020-12-20 DIAGNOSIS — I10 Essential (primary) hypertension: Secondary | ICD-10-CM | POA: Diagnosis not present

## 2020-12-20 DIAGNOSIS — Z95820 Peripheral vascular angioplasty status with implants and grafts: Secondary | ICD-10-CM

## 2020-12-20 DIAGNOSIS — E8889 Other specified metabolic disorders: Secondary | ICD-10-CM | POA: Diagnosis present

## 2020-12-20 HISTORY — DX: Peripheral vascular disease, unspecified: I73.9

## 2020-12-20 HISTORY — DX: Other persistent atrial fibrillation: I48.19

## 2020-12-20 HISTORY — DX: Gangrene, not elsewhere classified: I96

## 2020-12-20 HISTORY — DX: Other ill-defined heart diseases: I51.89

## 2020-12-20 LAB — RENAL FUNCTION PANEL
Albumin: 3.1 g/dL — ABNORMAL LOW (ref 3.5–5.0)
Anion gap: 16 — ABNORMAL HIGH (ref 5–15)
BUN: 32 mg/dL — ABNORMAL HIGH (ref 6–20)
CO2: 24 mmol/L (ref 22–32)
Calcium: 8.5 mg/dL — ABNORMAL LOW (ref 8.9–10.3)
Chloride: 95 mmol/L — ABNORMAL LOW (ref 98–111)
Creatinine, Ser: 7.49 mg/dL — ABNORMAL HIGH (ref 0.44–1.00)
GFR, Estimated: 6 mL/min — ABNORMAL LOW (ref >60)
Glucose, Bld: 107 mg/dL — ABNORMAL HIGH (ref 70–99)
Phosphorus: 4.7 mg/dL — ABNORMAL HIGH (ref 2.5–4.6)
Potassium: 4.4 mmol/L (ref 3.5–5.1)
Sodium: 135 mmol/L (ref 135–145)

## 2020-12-20 LAB — CBC WITH DIFFERENTIAL/PLATELET
Abs Immature Granulocytes: 1.21 10*3/uL — ABNORMAL HIGH (ref 0.00–0.07)
Basophils Absolute: 0.2 10*3/uL — ABNORMAL HIGH (ref 0.0–0.1)
Basophils Relative: 1 %
Eosinophils Absolute: 0.4 10*3/uL (ref 0.0–0.5)
Eosinophils Relative: 3 %
HCT: 37.4 % (ref 36.0–46.0)
Hemoglobin: 11.7 g/dL — ABNORMAL LOW (ref 12.0–15.0)
Immature Granulocytes: 8 %
Lymphocytes Relative: 10 %
Lymphs Abs: 1.5 10*3/uL (ref 0.7–4.0)
MCH: 31.4 pg (ref 26.0–34.0)
MCHC: 31.3 g/dL (ref 30.0–36.0)
MCV: 100.3 fL — ABNORMAL HIGH (ref 80.0–100.0)
Monocytes Absolute: 1.4 10*3/uL — ABNORMAL HIGH (ref 0.1–1.0)
Monocytes Relative: 9 %
Neutro Abs: 10.4 10*3/uL — ABNORMAL HIGH (ref 1.7–7.7)
Neutrophils Relative %: 69 %
Platelets: 153 10*3/uL (ref 150–400)
RBC: 3.73 MIL/uL — ABNORMAL LOW (ref 3.87–5.11)
RDW: 16.4 % — ABNORMAL HIGH (ref 11.5–15.5)
WBC: 15.1 10*3/uL — ABNORMAL HIGH (ref 4.0–10.5)
nRBC: 0 % (ref 0.0–0.2)

## 2020-12-20 MED ORDER — METRONIDAZOLE 500 MG PO TABS: 500.0000 mg | ORAL_TABLET | Freq: Three times a day (TID) | ORAL | Status: DC

## 2020-12-20 MED ORDER — CARVEDILOL 3.125 MG PO TABS
3.1250 mg | ORAL_TABLET | Freq: Two times a day (BID) | ORAL | Status: DC
  Administered 2020-12-20 – 2020-12-27 (×8): 3.125 mg via ORAL
  Filled 2020-12-20 (×10): qty 1

## 2020-12-20 MED ORDER — SEVELAMER CARBONATE 800 MG PO TABS
1600.0000 mg | ORAL_TABLET | ORAL | Status: DC | PRN
  Filled 2020-12-20: qty 2

## 2020-12-20 MED ORDER — ACETAMINOPHEN 325 MG PO TABS
650.0000 mg | ORAL_TABLET | Freq: Four times a day (QID) | ORAL | Status: DC | PRN
  Administered 2020-12-26: 650 mg via ORAL
  Filled 2020-12-20: qty 2

## 2020-12-20 MED ORDER — SENNOSIDES-DOCUSATE SODIUM 8.6-50 MG PO TABS
1.0000 | ORAL_TABLET | Freq: Every evening | ORAL | Status: DC | PRN
  Administered 2020-12-26: 1 via ORAL
  Filled 2020-12-20: qty 1

## 2020-12-20 MED ORDER — PANTOPRAZOLE SODIUM 40 MG PO TBEC
40.0000 mg | DELAYED_RELEASE_TABLET | Freq: Every day | ORAL | Status: DC
Start: 2020-12-20 — End: 2021-01-10
  Administered 2020-12-20 – 2021-01-09 (×20): 40 mg via ORAL
  Filled 2020-12-20 (×20): qty 1

## 2020-12-20 MED ORDER — ASPIRIN EC 81 MG PO TBEC
81.0000 mg | DELAYED_RELEASE_TABLET | Freq: Every day | ORAL | Status: DC
  Administered 2020-12-21 – 2021-01-10 (×17): 81 mg via ORAL
  Filled 2020-12-20 (×17): qty 1

## 2020-12-20 MED ORDER — GABAPENTIN 100 MG PO CAPS: 100.0000 mg | ORAL_CAPSULE | Freq: Every day | ORAL | Status: AC

## 2020-12-20 MED ORDER — ALBUTEROL SULFATE HFA 108 (90 BASE) MCG/ACT IN AERS
1.0000 | INHALATION_SPRAY | Freq: Four times a day (QID) | RESPIRATORY_TRACT | Status: DC | PRN
  Filled 2020-12-20: qty 6.7

## 2020-12-20 MED ORDER — HEPARIN SODIUM (PORCINE) 5000 UNIT/ML IJ SOLN
5000.0000 [IU] | Freq: Three times a day (TID) | INTRAMUSCULAR | Status: DC
  Administered 2020-12-20 – 2020-12-27 (×16): 5000 [IU] via SUBCUTANEOUS
  Filled 2020-12-20 (×16): qty 1

## 2020-12-20 MED ORDER — SEVELAMER CARBONATE 800 MG PO TABS
3200.0000 mg | ORAL_TABLET | Freq: Three times a day (TID) | ORAL | Status: DC
  Administered 2020-12-21 – 2021-01-03 (×21): 3200 mg via ORAL
  Administered 2021-01-03: 1600 mg via ORAL
  Administered 2021-01-04 (×2): 3200 mg via ORAL
  Administered 2021-01-05 (×3): 2400 mg via ORAL
  Administered 2021-01-06 – 2021-01-10 (×8): 3200 mg via ORAL
  Filled 2020-12-20 (×50): qty 4

## 2020-12-20 MED ORDER — ONDANSETRON HCL 4 MG/2ML IJ SOLN: 4.0000 mg | Freq: Four times a day (QID) | INTRAMUSCULAR | Status: DC | PRN

## 2020-12-20 MED ORDER — GABAPENTIN 100 MG PO CAPS
100.0000 mg | ORAL_CAPSULE | Freq: Every day | ORAL | Status: DC
  Administered 2020-12-20 – 2021-01-09 (×20): 100 mg via ORAL
  Filled 2020-12-20 (×20): qty 1

## 2020-12-20 MED ORDER — VANCOMYCIN HCL IN DEXTROSE 1-5 GM/200ML-% IV SOLN
1000.0000 mg | INTRAVENOUS | Status: DC
  Filled 2020-12-20: qty 200

## 2020-12-20 MED ORDER — HYDROCODONE-ACETAMINOPHEN 5-325 MG PO TABS
1.0000 | ORAL_TABLET | Freq: Four times a day (QID) | ORAL | Status: DC | PRN
  Administered 2020-12-20 – 2021-01-10 (×21): 1 via ORAL
  Filled 2020-12-20 (×22): qty 1

## 2020-12-20 MED ORDER — ONDANSETRON HCL 4 MG PO TABS
4.0000 mg | ORAL_TABLET | Freq: Four times a day (QID) | ORAL | Status: DC | PRN
  Administered 2021-01-04 (×2): 4 mg via ORAL
  Filled 2020-12-20 (×2): qty 1

## 2020-12-20 MED ORDER — POLYETHYLENE GLYCOL 3350 17 G PO PACK: 17.0000 g | PACK | Freq: Two times a day (BID) | ORAL | 0 refills | Status: AC

## 2020-12-20 MED ORDER — TICAGRELOR 90 MG PO TABS
90.0000 mg | ORAL_TABLET | Freq: Two times a day (BID) | ORAL | Status: DC
  Administered 2020-12-20 – 2020-12-27 (×12): 90 mg via ORAL
  Filled 2020-12-20 (×15): qty 1

## 2020-12-20 MED ORDER — SODIUM CHLORIDE 0.9 % IV SOLN
2.0000 g | INTRAVENOUS | Status: DC
  Administered 2020-12-21: 2 g via INTRAVENOUS
  Filled 2020-12-20: qty 2

## 2020-12-20 MED ORDER — LEVOTHYROXINE SODIUM 50 MCG PO TABS
300.0000 ug | ORAL_TABLET | Freq: Every day | ORAL | Status: DC
  Administered 2020-12-21 – 2021-01-09 (×19): 300 ug via ORAL
  Filled 2020-12-20 (×2): qty 6
  Filled 2020-12-20: qty 3
  Filled 2020-12-20 (×2): qty 6
  Filled 2020-12-20 (×4): qty 3
  Filled 2020-12-20: qty 6
  Filled 2020-12-20 (×2): qty 3
  Filled 2020-12-20 (×2): qty 6
  Filled 2020-12-20 (×2): qty 3
  Filled 2020-12-20 (×2): qty 6

## 2020-12-20 MED ORDER — METRONIDAZOLE 500 MG PO TABS
500.0000 mg | ORAL_TABLET | Freq: Three times a day (TID) | ORAL | Status: DC
  Administered 2020-12-20 – 2020-12-22 (×5): 500 mg via ORAL
  Filled 2020-12-20 (×8): qty 1

## 2020-12-20 NOTE — Plan of Care (Signed)

## 2020-12-20 NOTE — H&P (Signed)
History and Physical    Lori [name] [id number] DOB: 1971-05-20 DOA: 12/20/2020  PCP: Patient, No Pcp Per  Patient coming from: Transfer from [facility]  I have personally briefly reviewed patient's old medical records in [place]  Chief Complaint: Left foot wound  HPI: Lori [name] is a 50 y.o. female with medical history significant for CAD s/p stent to LAD in 2020, ESRD on HD MWF, hypertension, hypothyroidism, hyperparathyroidism, GERD who was initially admitted to [facility] on 12/14/2020 for a left foot wound.  Patient transferred from independent hospital.  Initially admitted 12/14/2020 for left foot wound.  She was seen by general surgery who are concerned she may ultimately need a left AKA.  This was discussed with patient and patient wanted to discuss with her vascular surgeon, Dr. Delana Meyer before any surgery is considered.  The hospitalist at [facility] discussed with patient's vascular surgeon on 12/16/2020 who recommended transfer to [facility] for further evaluation.  That has not been available until today (12/20/2020).  While at [facility], patient incidentally noted to be Covid + 12/14/2020 and was asymptomatic.  She was treated with 3 days IV remdesivir.  She has also been on broad-spectrum IV antibiotic with vancomycin, cefepime, and Flagyl.  Wound culture obtained 12/18/2020 has grown out Enterococcus faecalis and Proteus vulgaris.  Enterococcus faecalis sensitive to ampicillin, gentamicin, vancomycin.  She has continued dialysis on MWF schedule.  Review of Systems: All systems reviewed and are negative except as documented in history of present illness above.   Past Medical History:  Diagnosis Date  . Anemia   . ASCVD (arteriosclerotic cardiovascular disease)    a. s/p prior stenting of LAD b. NSTEMI in 01/2018 requiring DES x3 to RCA given spiral dissection from ostium to mid-RCA but residual disease along distal RCA, LAD and 2nd Mrg  . Calciphylaxis    . Chronic abdominal wound infection   . Dialysis patient ([place])   . ESRD (end stage renal disease) ([place])    Due to membranous GN dialysis 09/1996; peritoneal dialysis --? peitonitis; difficult vascular access  . GERD (gastroesophageal reflux disease)   . Hashimoto thyroiditis   . Hyperparathyroidism   . Hypertension   . Hypothyroidism   . Medically noncompliant   . Morbid obesity ([place])     Past Surgical History:  Procedure Laterality Date  . [place]  . CORONARY STENT INTERVENTION N/A 10/21/2019   Procedure: CORONARY STENT INTERVENTION;  Surgeon: Wellington Hampshire, MD;  Location: [place] [facility];  Service: Cardiovascular;  Laterality: N/A;  . LEFT HEART CATH AND CORONARY ANGIOGRAPHY N/A 02/11/2018   Procedure: LEFT HEART CATH AND CORONARY ANGIOGRAPHY;  Surgeon: Charolette Forward, MD;  Location: [place] [facility];  Service: Cardiovascular;  Laterality: N/A;  . LEFT HEART CATH AND CORONARY ANGIOGRAPHY N/A 10/14/2019   Procedure: LEFT HEART CATH AND CORONARY ANGIOGRAPHY;  Surgeon: Wellington Hampshire, MD;  Location: [place] [facility];  Service: Cardiovascular;  Laterality: N/A;  . THYROIDECTOMY, PARTIAL     Resectin of left lobe with reimplantation in the forearm, small focus of papillary carcinoma incidentally noted at pathology - 2000 and Hashimoto's thyrdoiditis in 2001  . TONSILLECTOMY AND ADENOIDECTOMY      Social History:  reports that she has quit smoking. She has never used smokeless tobacco. She reports that she does not drink alcohol and does not use drugs.  Allergies  Allergen Reactions  . Activase [Alteplase] Shortness Of Breath  . Bee Pollen  Anaphylaxis  . Warfarin Sodium Nausea And Vomiting and Rash    Family History  Problem Relation Age of Onset  . Coronary artery disease Mother   . Kidney disease Father   . Diabetes Sister      Prior to Admission medications   Medication Sig Start Date End Date Taking? Authorizing Provider  albuterol (PROVENTIL  HFA;VENTOLIN HFA) 108 (90 BASE) MCG/ACT inhaler Inhale 2 puffs into the lungs every 6 (six) hours as needed. Shortness of breath   Yes [provider]  aspirin EC 81 MG tablet Take 81 mg by mouth daily. Swallow whole.   Yes [provider]  b complex-vitamin c-folic acid (NEPHRO-VITE) 0.8 MG TABS tablet Take 1 tablet by mouth daily.   Yes [provider]  calcitRIOL (ROCALTROL) 0.25 MCG capsule Take 3 capsules (0.75 mcg total) by mouth every Monday, Wednesday, and Friday with hemodialysis. 12/19/20  Yes Johnson, Clanford L, MD  ceFEPIme 2 g in sodium chloride 0.9 % 100 mL Inject 2 g into the vein every Monday, Wednesday, and Friday with hemodialysis. 12/16/20  Yes Johnson, Clanford L, MD  gabapentin (NEURONTIN) 100 MG capsule Take 1 capsule (100 mg total) by mouth at bedtime. 12/20/20  Yes Johnson, Clanford L, MD  HYDROcodone-acetaminophen (NORCO/VICODIN) 5-325 MG tablet TAKE (1) TABLET BY MOUTH EVERY (6) HOURS AS NEEDED. 11/29/20  Yes Sanjuana Kava, MD  levothyroxine (SYNTHROID, LEVOTHROID) 300 MCG tablet Take 300 mcg by mouth at bedtime. SYNTHROID ONLY-BRAND NAME MEDICALLY NECESSARY   Yes [provider]  metroNIDAZOLE (FLAGYL) 500 MG tablet Take 1 tablet (500 mg total) by mouth every 8 (eight) hours. 12/20/20  Yes Johnson, Clanford L, MD  midodrine (PROAMATINE) 10 MG tablet Take 1 tablet (10 mg total) by mouth 3 (three) times daily with meals. 12/16/20  Yes Johnson, Clanford L, MD  nitroGLYCERIN (NITROSTAT) 0.4 MG SL tablet Place 1 tablet (0.4 mg total) under the tongue every 5 (five) minutes x 3 doses as needed for chest pain. 02/14/18  Yes Charolette Forward, MD  nystatin (MYCOSTATIN/NYSTOP) powder Apply topically 2 (two) times daily. 12/16/20  Yes Johnson, Clanford L, MD  pantoprazole (PROTONIX) 40 MG tablet Take 1 tablet (40 mg total) by mouth at bedtime. 12/16/20  Yes Johnson, Clanford L, MD  polyethylene glycol (MIRALAX / GLYCOLAX) 17 g packet Take 17 g by mouth 2 (two)  times daily. 12/20/20  Yes Johnson, Clanford L, MD  sevelamer carbonate (RENVELA) 800 MG tablet Take 1,600-3,200 mg by mouth 3 (three) times daily with meals. '3200mg'$  with meals and '1600mg'$  with snacks   Yes [provider]  ticagrelor (BRILINTA) 90 MG TABS tablet Take 1 tablet (90 mg total) by mouth 2 (two) times daily. 02/14/18  Yes Charolette Forward, MD  vancomycin (VANCOCIN) 1-5 GM/200ML-% SOLN Inject 200 mLs (1,000 mg total) into the vein every Monday, Wednesday, and Friday with hemodialysis. 12/16/20  Yes Johnson, Clanford L, MD  EPINEPHrine 0.3 mg/0.3 mL IJ SOAJ injection Inject 0.3 mg into the muscle as needed.    [provider]  fentaNYL (SUBLIMAZE) 100 MCG/2ML injection Inject 0.25 mLs (12.5 mcg total) into the vein every 3 (three) hours as needed for severe pain. 12/17/20   Murlean Iba, MD    Physical Exam: Vitals:   12/20/20 1858 12/20/20 1909  BP: (!) 135/106 (!) 142/111  Pulse: (!) 117 (!) 115  Resp: 18 18  Temp: 98.4 F (36.9 C)   TempSrc: Oral   SpO2: 99% 99%  Weight: 102.9 kg  Height: '5\' 3"'$  (1.6 m)    Constitutional: Resting in bed, NAD, calm, comfortable Eyes: PERRL, lids and conjunctivae normal ENMT: Mucous membranes are moist. Posterior pharynx clear of any exudate or lesions.Normal dentition.  Neck: normal, supple, no masses. Respiratory: clear to auscultation bilaterally, no wheezing, no crackles. Normal respiratory effort. No accessory muscle use.  Cardiovascular: Regular rate and rhythm, no murmurs / rubs / gallops. No extremity edema.  Pedal pulse difficult to palpate on left.  HD catheter in place right chest wall. Abdomen: no tenderness, no masses palpated. Bowel sounds positive.  Musculoskeletal: no clubbing / cyanosis. No joint deformity upper and lower extremities. Good ROM, no contractures. Normal muscle tone.  Skin: Dry necrotic/ischemic appearance of left fourth and fifth toes as pictured below.  No active drainage. Neurologic: CN 2-12  grossly intact. Sensation intact. Strength 5/5 in all 4.  Psychiatric: Normal judgment and insight. Alert and oriented x 3. Normal mood.     Labs on Admission: I have personally reviewed following labs and imaging studies  CBC: Recent Labs  Lab 12/14/20 1715 12/15/20 0613 12/16/20 0417 12/17/20 0507 12/18/20 0411 12/19/20 0826 12/20/20 0328  WBC 20.6*   < > 15.4* 13.9* 16.3* 17.3* 15.1*  NEUTROABS 16.9*  --   --  11.3* 13.4* 13.1* 10.4*  HGB 11.4*   < > 10.6* 10.5* 10.6* 12.3 11.7*  HCT 35.1*   < > 32.9* 31.6* 32.5* 38.2 37.4  MCV 99.4   < > 100.0 96.9 97.6 99.5 100.3*  PLT 167   < > 150 158 157 145* 153   < > = values in this interval not displayed.   Basic Metabolic Panel: Recent Labs  Lab 12/16/20 0417 12/17/20 0506 12/18/20 0411 12/19/20 0826 12/20/20 0328  NA 130* 133* 134* 131* 135  K 4.9 4.3 4.8 5.3* 4.4  CL 93* 95* 95* 96* 95*  CO2 18* 22 21* 18* 24  GLUCOSE 79 85 82 89 107*  BUN 53* 32* 43* 59* 32*  CREATININE 11.27* 7.51* 9.09* 11.56* 7.49*  CALCIUM 7.9* 8.4* 8.5* 8.5* 8.5*  PHOS 7.4* 5.7* 7.0* 7.5* 4.7*   GFR: Estimated Creatinine Clearance: 10.2 mL/min (A) (by C-G formula based on SCr of 7.49 mg/dL (H)). Liver Function Tests: Recent Labs  Lab 12/14/20 1715 12/16/20 0417 12/17/20 0506 12/18/20 0411 12/19/20 0826 12/20/20 0328  AST 65*  --   --   --   --   --   ALT 27  --   --   --   --   --   ALKPHOS 50  --   --   --   --   --   BILITOT 0.6  --   --   --   --   --   PROT 7.1  --   --   --   --   --   ALBUMIN 3.5 3.0* 3.2* 3.3* 3.2* 3.1*   No results for input(s): LIPASE, AMYLASE in the last 168 hours. No results for input(s): AMMONIA in the last 168 hours. Coagulation Profile: No results for input(s): INR, PROTIME in the last 168 hours. Cardiac Enzymes: No results for input(s): CKTOTAL, CKMB, CKMBINDEX, TROPONINI in the last 168 hours. BNP (last 3 results) No results for input(s): PROBNP in the last 8760 hours. HbA1C: No results for  input(s): HGBA1C in the last 72 hours. CBG: No results for input(s): GLUCAP in the last 168 hours. Lipid Profile: No results for input(s): CHOL, HDL, LDLCALC, TRIG, CHOLHDL, LDLDIRECT in  the last 72 hours. Thyroid Function Tests: No results for input(s): TSH, T4TOTAL, FREET4, T3FREE, THYROIDAB in the last 72 hours. Anemia Panel: No results for input(s): VITAMINB12, FOLATE, FERRITIN, TIBC, IRON, RETICCTPCT in the last 72 hours. Urine analysis: No results found for: COLORURINE, APPEARANCEUR, LABSPEC, PHURINE, GLUCOSEU, HGBUR, BILIRUBINUR, KETONESUR, PROTEINUR, UROBILINOGEN, NITRITE, LEUKOCYTESUR  Radiological Exams on Admission: No results found.  EKG: Personally reviewed. Sinus rhythm with prolonged PR interval.  Assessment/Plan Principal Problem:   Cellulitis of fourth toe of left foot Active Problems:   ESRD on dialysis ([place])   Hypothyroidism   Atrial fibrillation ([place])  Lori [name] is a 50 y.o. female with medical history significant for CAD s/p stent to LAD in 2020, ESRD on HD MWF, hypertension, hypothyroidism, hyperparathyroidism, GERD who is admitted from [facility] for vascular surgery evaluation of left foot vascular disease.  Left foot wound infection with chronic ischemia of the left lower extremity: Dry necrotic/ischemic appearance of left fourth and fifth toes.  Transferred from [facility] for vascular surgery evaluation.  Patient follows with Dr. Delana Meyer.  Has been on broad-spectrum antibiotics.  Wound culture grew out Enterococcus faecalis and Proteus vulgaris. -Continue on antibiotics with IV vancomycin, cefepime, Flagyl for now -Vascular surgery, Dr. Lucky Cowboy, notified of patient's arrival for consult tomorrow.  Appreciate assistance.  ESRD on HD MWF: Continue dialysis per nephrology, appreciate assistance.  COVID-19 positive: Incidentally Covid + 12/14/2020.  She has been asymptomatic.  She states she has been fully vaccinated including booster dose.  She  received 3 days IV remdesivir.  Currently no need for further directed management.  Remain on airborne precautions.  Paroxysmal atrial fibrillation: Not on anticoagulation or currently on rate/rhythm controlling agents.  Resume Coreg 3.125 mg twice daily.  Continue aspirin, Brilinta.  CAD: -Continue aspirin, Brilinta -Not currently on statin  Hypertension: Was noted to be hypotensive any pain, however hypertensive now.  Restart Coreg 3.125 mg twice daily and increase further as needed.  Hypothyroidism: -Continue Synthroid  DVT prophylaxis: Subcutaneous heparin Code Status: Full code, confirmed with patient Family Communication: Discussed with patient, she has discussed with family Disposition Plan: From home, discharge pending vascular surgery evaluation Consults called: Vascular surgery, nephrology Admission status:  Status is: Inpatient  Remains inpatient appropriate because:Ongoing diagnostic testing needed not appropriate for outpatient work up and Inpatient level of care appropriate due to severity of illness   Dispo: The patient is from: Home              Anticipated d/c is to: Home              Anticipated d/c date is: 3 days              Patient currently is not medically stable to d/c.  Zada Finders MD [facility]  If 7PM-7AM, please contact night-coverage [email or web address]  12/20/2020, 8:43 PM

## 2020-12-20 NOTE — Progress Notes (Signed)
Patient ID: Lori [name], female   DOB: [birth year], 50 y.o.   MRN: [id number] S: No events overnight and tolerated dialysis. O:BP (!) 117/23   Pulse (!) 114   Temp 97.6 F (36.4 C) (Oral)   Resp 10   Ht '5\' 3"'$  (1.6 m)   Wt 106.8 kg   SpO2 99%   BMI 41.71 kg/m   Intake/Output Summary (Last 24 hours) at 12/20/2020 0858 Last data filed at 12/20/2020 0800 Gross per 24 hour  Intake 100 ml  Output 1855 ml  Net -1755 ml   Intake/Output: I/O last 3 completed shifts: In: 200 [IV [id number] Out: [id number]; Stool:1]  Intake/Output this shift:  No intake/output data recorded. Weight change:  Physical exam: unable to complete due to COVID + status.  In order to preserve PPE equipment and to minimize exposure to providers.  Notes from other caregivers reviewed  Recent Labs  Lab 12/14/20 1715 12/15/20 [id number] 12/16/20 0417 12/17/20 0506 12/18/20 0411 12/19/20 0826 12/20/20 0328  NA 132* 130* 130* 133* 134* 131* 135  K 5.1 4.8 4.9 4.3 4.8 5.3* 4.4  CL 94* 92* 93* 95* 95* 96* 95*  CO2 21* 21* 18* 22 21* 18* 24  GLUCOSE 80 86 79 85 82 89 107*  BUN 40* 43* 53* 32* 43* 59* 32*  CREATININE 9.68* 9.98* 11.27* 7.51* 9.09* 11.56* 7.49*  ALBUMIN 3.5  --  3.0* 3.2* 3.3* 3.2* 3.1*  CALCIUM 8.2* 8.0* 7.9* 8.4* 8.5* 8.5* 8.5*  PHOS  --   --  7.4* 5.7* 7.0* 7.5* 4.7*  AST 65*  --   --   --   --   --   --   ALT 27  --   --   --   --   --   --    Liver Function Tests: Recent Labs  Lab 12/14/20 1715 12/16/20 0417 12/18/20 0411 12/19/20 0826 12/20/20 0328  AST 65*  --   --   --   --   ALT 27  --   --   --   --   ALKPHOS 50  --   --   --   --   BILITOT 0.6  --   --   --   --   PROT 7.1  --   --   --   --   ALBUMIN 3.5   < > 3.3* 3.2* 3.1*   < > = values in this interval not displayed.   No results for input(s): LIPASE, AMYLASE in the last 168 hours. No results for input(s): AMMONIA in the last 168 hours. CBC: Recent Labs  Lab 12/16/20 0417 12/17/20 0507 12/18/20 0411  12/19/20 0826 12/20/20 0328  WBC 15.4* 13.9* 16.3* 17.3* 15.1*  NEUTROABS  --  11.3* 13.4* 13.1* 10.4*  HGB 10.6* 10.5* 10.6* 12.3 11.7*  HCT 32.9* 31.6* 32.5* 38.2 37.4  MCV 100.0 96.9 97.6 99.5 100.3*  PLT 150 158 157 145* 153   Cardiac Enzymes: No results for input(s): CKTOTAL, CKMB, CKMBINDEX, TROPONINI in the last 168 hours. CBG: No results for input(s): GLUCAP in the last 168 hours.  Iron Studies: No results for input(s): IRON, TIBC, TRANSFERRIN, FERRITIN in the last 72 hours. Studies/Results: No results found. Marland Kitchen aspirin EC  81 mg Oral Daily  . calcitRIOL  0.75 mcg Oral Q M,W,F-HD  . Chlorhexidine Gluconate Cloth  6 each Topical Daily  . Chlorhexidine Gluconate Cloth  6 each Topical Q0600  . Chlorhexidine Gluconate Cloth  6  each Topical [id number]  . collagenase   Topical Daily  . gabapentin  100 mg Oral QHS  . heparin  5,000 Units Subcutaneous Q8H  . levothyroxine  300 mcg Oral QHS  . metroNIDAZOLE  500 mg Oral Q8H  . midodrine  10 mg Oral TID WC  . nystatin   Topical BID  . pantoprazole  40 mg Oral QHS  . polyethylene glycol  17 g Oral BID  . sevelamer carbonate  3,200 mg Oral TID WC  . ticagrelor  90 mg Oral BID    BMET    Component Value Date/Time   NA 135 12/20/2020 0328   NA 138 03/26/2014 1425   K 4.4 12/20/2020 0328   K 3.5 03/26/2014 1425   CL 95 (L) 12/20/2020 0328   CL 99 03/26/2014 1425   CO2 24 12/20/2020 0328   CO2 26 03/26/2014 1425   GLUCOSE 107 (H) 12/20/2020 0328   GLUCOSE 79 03/26/2014 1425   BUN 32 (H) 12/20/2020 0328   BUN 36 (H) 03/26/2014 1425   CREATININE 7.49 (H) 12/20/2020 0328   CREATININE 9.54 (H) 03/26/2014 1425   CALCIUM 8.5 (L) 12/20/2020 0328   CALCIUM 7.7 (L) 03/26/2014 1425   GFRNONAA 6 (L) 12/20/2020 0328   GFRNONAA 4 (L) 03/26/2014 1425   GFRAA 7 (L) 10/22/2019 0240   GFRAA 5 (L) 03/26/2014 1425   CBC    Component Value Date/Time   WBC 15.1 (H) 12/20/2020 0328   RBC 3.73 (L) 12/20/2020 0328   HGB 11.7 (L) 12/20/2020  0328   HGB 10.0 (L) 03/26/2014 1842   HCT 37.4 12/20/2020 0328   HCT 32.0 (L) 03/26/2014 1842   PLT 153 12/20/2020 0328   PLT 332 03/26/2014 1425   MCV 100.3 (H) 12/20/2020 0328   MCV 90 03/26/2014 1425   MCH 31.4 12/20/2020 0328   MCHC 31.3 12/20/2020 0328   RDW 16.4 (H) 12/20/2020 0328   RDW 15.3 (H) 03/26/2014 1425   LYMPHSABS 1.5 12/20/2020 0328   LYMPHSABS 1.0 03/26/2014 1425   MONOABS 1.4 (H) 12/20/2020 0328   MONOABS 0.8 03/26/2014 1425   EOSABS 0.4 12/20/2020 0328   EOSABS 0.3 03/26/2014 1425   BASOSABS 0.2 (H) 12/20/2020 0328   BASOSABS 0.2 (H) 03/26/2014 1425     Dialyzes [facility]- MWF- 4 hoursEDW 101.5. HD Bath2/2.5, Dialyzer180, Heparinyes- 10,000. AccessTDC.Calcitriol 0.75 TIW 1/17 hgb 11.3, last calc 10, last K 5.0, alb 4.2, phos 9.9, pth 404  Assessment/Plan:  1. Left foot cellulitis - blood cultures negative, incidental covid-19 + test.  Primary service feels that elevated WBC was due to left foot wound and chronic ischemic changes.  Pt not ready for amputation and wants to discuss with her vascular surgeon, Dr. Delana Meyer.  Will transfer to [facility] for evaluation once bed available. 2. covid-19 +- pt asymptomatic but treated with remdesivir x 3 days per primary svc. 3. ESRD- continue with HD on MWF schedule 4. HTN- improved hypotension. 5. Anemia of ESRD 6. CMBD- continue with binders and vit D. 7. Disposition- awaiting bed at [facility] for surgical evaluation.  Donetta Potts, MD [employer] [phone]

## 2020-12-20 NOTE — Progress Notes (Signed)
Gave report to RN at [facility] telephone [phone] room [phone]. Questions were answered and told RN of current time of transport. [facility] performed transport and transported clothing and patients carton of drinks.

## 2020-12-20 NOTE — Discharge Summary (Addendum)
DISCHARGE NOTE   Lori [name]  [id number] DOB: 1971-05-22 DOA: 12/14/2020 PCP: Patient, No Pcp Per   Chief Complaint  Patient presents with  . Fall    Brief Admission History:   50 y.o. female, past medical history of CAD status post stent to LAD and 2020, ESRD on hemodialysis Monday Wednesday Friday, followed by Dr. Marval Regal, hypertension, hypothyroidism, hyperparathyroidism, GERD. -Patient presents to ED status post fall, head pain, back pain and generalized weakness, patient reported mechanical fall as she was getting out of the [place] for her hemodialysis, she does report a fall with head trauma, denies any syncope or near syncope, for very generalized weak, fatigue, reports she did not finish her dialysis due to weakness, and she was sent to ED for further evaluation, any fever, chills, cough, shortness of breath, patient reports left foot wound for last month, she has been following by podiatry in [place], she finished antibiotic course last week, report wound is not improving, as well reports she has been out of her cardiac medications for last few weeks, and could not renew as she did not see her cardiologist (Dr. Terrence Dupont).  Patient denies any respiratory symptoms, no cough, no shortness of breath, reports she tested negative for COVID last week.  Her screening COVID-19 test came back positive in ED  -Presentation to ED, blood pressure was soft, white blood cell elevated at 20 K, but she had normal lactic acid, x-ray of infected foot with no evidence of osteomyelitis, hypotensive responded to fluid bolus, [facility] hospitalist consulted to admit.   Assessment & Plan:   Active Problems:   ESRD on dialysis ([place])   BMI 40.0-44.9, adult (HCC)   Atrial fibrillation (HCC)   Hyperlipidemia, unspecified   Infected wound   Cellulitis of fourth toe of left foot  1. Sepsis ruled out.  Left foot wound infection of left 4th/5th toes limited to skin breakdown - Unfortunately from the vascular studies  done she appears to have chronic ischemia of the left lower extremity with very poor vascular flow of the left foot.  I discussed with Dr. Arnoldo Morale and patient not ready to consider amputation at LLE at this time but she wants to have evaluation and opinion from her primary vascular surgeon Dr. Delana Meyer at [facility].  I spoke with Dr. Delana Meyer and he is agreeable to consult on patient if we transfer to [facility]. Transfer orders placed.  Pt now has bed at [facility].  Pt to transfer to [facility] today.  I called vascular surgery on call and made them aware patient is getting a bed.  Wound culture sent from purulent secretion from wound 1/23.  Continue broad spectrum antibiotics.  No evidence of osteomyelitis. Pt likely is going to need operative management as definitive treatment.   2. Leukocytosis - from left foot infection - Monitoring.  WBC slowly trending down. Discussed with surgery.  Feels secondary to ischemic left foot and wound infection.  Poor antibiotic penetration to infection due to poor circulation.  Transfer to [facility] to see Dr. Delana Meyer.   3. ESRD on HD MWF - Pt had successful HD on 1/24. Nephrology team following.  4. Left foot and leg pain - secondary to chronic ischemia, added fentanyl IV for severe pain symptoms.     5. Abdominal pain - Pt had CT on admission which was unrevealing.  I suspect opioid induced constipation, added miralax, give dulcolax suppository, bladder scan done with scant urine seen. 6. Covid Infection - incidental finding - given high risk status  treated with remdesivir x 3 day course.  7. CAD - continue aspirin and brilinta.  8. Chronic atrial fibrillation - Pt says she has not been taking her amiodarone.  Held carvedilol temporarily in setting of soft BPs.  HR controlled. 9. Hypotension - has not been persistently low but has intermittently been low, she is on midodrine with good results.  Continue to follow.  Holding BP meds temporarily with soft BPs.   10. Hypothyroidism - continue  levothyroxine.    DVT prophylaxis: heparin Code Status: full  Family Communication: spoke with daughter Amy [phone] on 1/25 Disposition: Transfer to [facility] for surgical consultation   Status is: Inpatient  Remains inpatient appropriate because:IV treatments appropriate due to intensity of illness or inability to take PO and Inpatient level of care appropriate due to severity of illness  Dispo: The patient is from: Home              Anticipated d/c is to: Home              Anticipated d/c date is: 3 days              Patient currently is not medically stable to d/c.  Consultants:   Surgery  Nephrology  Procedures:     DISCHARGE MEDICATIONS  Allergies  Allergen Reactions  . Activase [Alteplase] Shortness Of Breath  . Bee Pollen Anaphylaxis  . Warfarin Sodium Nausea And Vomiting and Rash   Allergies as of 12/20/2020      Reactions   Activase [alteplase] Shortness Of Breath   Bee Pollen Anaphylaxis   Warfarin Sodium Nausea And Vomiting, Rash      Medication List    STOP taking these medications   amiodarone 200 MG tablet Commonly known as: PACERONE   atorvastatin 80 MG tablet Commonly known as: LIPITOR   carvedilol 3.125 MG tablet Commonly known as: COREG   ibuprofen 200 MG tablet Commonly known as: ADVIL   losartan 25 MG tablet Commonly known as: COZAAR   metoprolol tartrate 25 MG tablet Commonly known as: LOPRESSOR   MIRCERA IJ   sulfamethoxazole-trimethoprim 800-160 MG tablet Commonly known as: BACTRIM DS     TAKE these medications   albuterol 108 (90 Base) MCG/ACT inhaler Commonly known as: VENTOLIN HFA Inhale 2 puffs into the lungs every 6 (six) hours as needed. Shortness of breath   aspirin EC 81 MG tablet Take 81 mg by mouth daily. Swallow whole.   b complex-vitamin c-folic acid 0.8 MG Tabs tablet Take 1 tablet by mouth daily.   calcitRIOL 0.25 MCG capsule Commonly known as: ROCALTROL Take 3 capsules (0.75 mcg total) by mouth every  Monday, Wednesday, and Friday with hemodialysis.   ceFEPIme 2 g in sodium chloride 0.9 % 100 mL Inject 2 g into the vein every Monday, Wednesday, and Friday with hemodialysis.   EPINEPHrine 0.3 mg/0.3 mL Soaj injection Commonly known as: EPI-PEN Inject 0.3 mg into the muscle as needed.   fentaNYL 100 MCG/2ML injection Commonly known as: SUBLIMAZE Inject 0.25 mLs (12.5 mcg total) into the vein every 3 (three) hours as needed for severe pain.   gabapentin 100 MG capsule Commonly known as: NEURONTIN Take 1 capsule (100 mg total) by mouth at bedtime.   HYDROcodone-acetaminophen 5-325 MG tablet Commonly known as: NORCO/VICODIN TAKE (1) TABLET BY MOUTH EVERY (6) HOURS AS NEEDED.   levothyroxine 300 MCG tablet Commonly known as: SYNTHROID Take 300 mcg by mouth at bedtime. SYNTHROID ONLY-BRAND NAME MEDICALLY NECESSARY  metroNIDAZOLE 500 MG tablet Commonly known as: FLAGYL Take 1 tablet (500 mg total) by mouth every 8 (eight) hours.   midodrine 10 MG tablet Commonly known as: PROAMATINE Take 1 tablet (10 mg total) by mouth 3 (three) times daily with meals.   nitroGLYCERIN 0.4 MG SL tablet Commonly known as: NITROSTAT Place 1 tablet (0.4 mg total) under the tongue every 5 (five) minutes x 3 doses as needed for chest pain.   nystatin powder Commonly known as: MYCOSTATIN/NYSTOP Apply topically 2 (two) times daily.   pantoprazole 40 MG tablet Commonly known as: PROTONIX Take 1 tablet (40 mg total) by mouth at bedtime.   polyethylene glycol 17 g packet Commonly known as: MIRALAX / GLYCOLAX Take 17 g by mouth 2 (two) times daily.   sevelamer carbonate 800 MG tablet Commonly known as: RENVELA Take 1,600-3,200 mg by mouth 3 (three) times daily with meals. '3200mg'$  with meals and '1600mg'$  with snacks   ticagrelor 90 MG Tabs tablet Commonly known as: BRILINTA Take 1 tablet (90 mg total) by mouth 2 (two) times daily.   vancomycin 1-5 GM/200ML-% Soln Commonly known as:  VANCOCIN Inject 200 mLs (1,000 mg total) into the vein every Monday, Wednesday, and Friday with hemodialysis.          Subjective: Pt reports pain in left foot is controlled today.      Objective: Vitals:   12/20/20 0656 12/20/20 0749 12/20/20 0800 12/20/20 1144  BP: (!) 92/55  (!) 117/23   Pulse: (!) 112 (!) 114  (!) 114  Resp: '10 18 10 17  '$ Temp:  97.6 F (36.4 C)  97.8 F (36.6 C)  TempSrc:  Oral  Oral  SpO2: 95% 99%  99%  Weight:      Height:        Intake/Output Summary (Last 24 hours) at 12/20/2020 1318 Last data filed at 12/20/2020 0800 Gross per 24 hour  Intake 100 ml  Output 1854 ml  Net -1754 ml   Filed Weights   12/16/20 1505 12/18/20 0300 12/19/20 0408  Weight: 106.2 kg 107 kg 106.8 kg   Examination:  General exam: chronically ill appearing female, Appears calm and comfortable  Respiratory system: Clear to auscultation. Respiratory effort normal. Cardiovascular system: normal S1 & S2 heard. No JVD, murmurs, rubs, gallops or clicks. No pedal edema. Gastrointestinal system: Abdomen is nondistended, soft and nontender. No organomegaly or masses felt. Normal bowel sounds heard. Central nervous system: Alert and oriented. No focal neurological deficits. Extremities: cool left leg, left foot wound unchanged, black wet necrotic area seen on dorsal left foot. No pedal pulses palpated in left foot.  Skin: mottled splotchy appearance.   Psychiatry: Judgement and insight appear normal. Mood & affect appropriate.   Data Reviewed: I have personally reviewed following labs and imaging studies  CBC: Recent Labs  Lab 12/14/20 1715 12/15/20 0613 12/16/20 0417 12/17/20 0507 12/18/20 0411 12/19/20 0826 12/20/20 0328  WBC 20.6*   < > 15.4* 13.9* 16.3* 17.3* 15.1*  NEUTROABS 16.9*  --   --  11.3* 13.4* 13.1* 10.4*  HGB 11.4*   < > 10.6* 10.5* 10.6* 12.3 11.7*  HCT 35.1*   < > 32.9* 31.6* 32.5* 38.2 37.4  MCV 99.4   < > 100.0 96.9 97.6 99.5 100.3*  PLT 167   < >  150 158 157 145* 153   < > = values in this interval not displayed.    Basic Metabolic Panel: Recent Labs  Lab 12/16/20 0417 12/17/20 0506 12/18/20  0411 12/19/20 0826 12/20/20 0328  NA 130* 133* 134* 131* 135  K 4.9 4.3 4.8 5.3* 4.4  CL 93* 95* 95* 96* 95*  CO2 18* 22 21* 18* 24  GLUCOSE 79 85 82 89 107*  BUN 53* 32* 43* 59* 32*  CREATININE 11.27* 7.51* 9.09* 11.56* 7.49*  CALCIUM 7.9* 8.4* 8.5* 8.5* 8.5*  PHOS 7.4* 5.7* 7.0* 7.5* 4.7*    GFR: Estimated Creatinine Clearance: 10.4 mL/min (A) (by C-G formula based on SCr of 7.49 mg/dL (H)).  Liver Function Tests: Recent Labs  Lab 12/14/20 1715 12/16/20 0417 12/17/20 0506 12/18/20 0411 12/19/20 0826 12/20/20 0328  AST 65*  --   --   --   --   --   ALT 27  --   --   --   --   --   ALKPHOS 50  --   --   --   --   --   BILITOT 0.6  --   --   --   --   --   PROT 7.1  --   --   --   --   --   ALBUMIN 3.5 3.0* 3.2* 3.3* 3.2* 3.1*    CBG: No results for input(s): GLUCAP in the last 168 hours.  Recent Results (from the past 240 hour(s))  Blood culture (routine x 2)     Status: None   Collection Time: 12/14/20  2:18 PM   Specimen: Right Antecubital; Blood  Result Value Ref Range Status   Specimen Description RIGHT ANTECUBITAL  Final   Special Requests   Final    BOTTLES DRAWN AEROBIC AND ANAEROBIC Blood Culture results may not be optimal due to an inadequate volume of blood received in culture bottles   Culture   Final    NO GROWTH 5 DAYS Performed at [facility], [address], [place], [place] [address]    Report Status 12/19/2020 FINAL  Final  Resp Panel by RT-PCR (Flu A&B, Covid) Nasopharyngeal Swab     Status: Abnormal   Collection Time: 12/14/20  7:21 PM   Specimen: Nasopharyngeal Swab; Nasopharyngeal(NP) swabs in vial transport medium  Result Value Ref Range Status   SARS Coronavirus 2 by RT PCR POSITIVE (A) NEGATIVE Final    Comment: RESULT CALLED TO, READ BACK BY AND VERIFIED WITH: HARRY,A @ 2016 ON  12/14/20 BY JUW (NOTE) SARS-CoV-2 target nucleic acids are DETECTED.  The SARS-CoV-2 RNA is generally detectable in upper respiratory specimens during the acute phase of infection. Positive results are indicative of the presence of the identified virus, but do not rule out bacterial infection or co-infection with other pathogens not detected by the test. Clinical correlation with patient history and other diagnostic information is necessary to determine patient infection status. The expected result is Negative.  Fact Sheet for Patients: EntrepreneurPulse.com.au  Fact Sheet for Healthcare Providers: IncredibleEmployment.be  This test is not yet approved or cleared by the [place] FDA and  has been authorized for detection and/or diagnosis of SARS-CoV-2 by FDA under an Emergency Use Authorization (EUA).  This EUA will remain in effect (meaning this test can be  used) for the duration of  the COVID-19 declaration under Section 564(b)(1) of the Act, 21 U.S.C. section 360bbb-3(b)(1), unless the authorization is terminated or revoked sooner.     Influenza A by PCR NEGATIVE NEGATIVE Final   Influenza B by PCR NEGATIVE NEGATIVE Final    Comment: (NOTE) The Xpert Xpress SARS-CoV-2/FLU/RSV plus assay is intended  as an aid in the diagnosis of influenza from Nasopharyngeal swab specimens and should not be used as a sole basis for treatment. Nasal washings and aspirates are unacceptable for Xpert Xpress SARS-CoV-2/FLU/RSV testing.  Fact Sheet for Patients: EntrepreneurPulse.com.au  Fact Sheet for Healthcare Providers: IncredibleEmployment.be  This test is not yet approved or cleared by the [place] FDA and has been authorized for detection and/or diagnosis of SARS-CoV-2 by FDA under an Emergency Use Authorization (EUA). This EUA will remain in effect (meaning this test can be used) for the duration of  the COVID-19 declaration under Section 564(b)(1) of the Act, 21 U.S.C. section 360bbb-3(b)(1), unless the authorization is terminated or revoked.  Performed at [facility], [address] [place], [place] [address]   Blood culture (routine x 2)     Status: None   Collection Time: 12/14/20  9:25 PM   Specimen: Neck; Blood  Result Value Ref Range Status   Specimen Description NECK  Final   Special Requests   Final    BOTTLES DRAWN AEROBIC AND ANAEROBIC Blood Culture adequate volume   Culture   Final    NO GROWTH 5 DAYS Performed at [facility], [address], [place], [place] [address]    Report Status 12/19/2020 FINAL  Final  Aerobic Culture (superficial specimen)     Status: None (Preliminary result)   Collection Time: 12/18/20  8:38 AM   Specimen: Wound  Result Value Ref Range Status   Specimen Description   Final    WOUND Performed at [facility], [address], [place], [place] [address]    Special Requests   Final    LEFT FOOT Performed at [facility], [address], [place], [place] [address]    Gram Stain   Final    RARE WBC PRESENT, PREDOMINANTLY MONONUCLEAR FEW GRAM POSITIVE COCCI RARE YEAST    Culture   Final    MODERATE ENTEROCOCCUS FAECALIS CULTURE REINCUBATED FOR BETTER GROWTH SUSCEPTIBILITIES TO FOLLOW Performed at [facility], [place] [address], [place], [place] [address]    Report Status PENDING  Incomplete     Radiology Studies: No results found. Scheduled Meds: . aspirin EC  81 mg Oral Daily  . calcitRIOL  0.75 mcg Oral Q M,W,F-HD  . Chlorhexidine Gluconate Cloth  6 each Topical Daily  . Chlorhexidine Gluconate Cloth  6 each Topical Q0600  . Chlorhexidine Gluconate Cloth  6 each Topical Q0600  . collagenase   Topical Daily  . gabapentin  100 mg Oral QHS  . heparin  5,000 Units Subcutaneous Q8H  . levothyroxine  300 mcg Oral QHS  . metroNIDAZOLE  500 mg Oral Q8H  . midodrine  10 mg Oral TID WC  . nystatin   Topical BID  .  pantoprazole  40 mg Oral QHS  . polyethylene glycol  17 g Oral BID  . sevelamer carbonate  3,200 mg Oral TID WC  . ticagrelor  90 mg Oral BID   Continuous Infusions: . sodium chloride    . sodium chloride    . ceFEPime (MAXIPIME) IV 2 g (12/19/20 2135)  . vancomycin 1,000 mg (12/19/20 2035)    LOS: 6 days   Time spent: 35 mins   Kamylah Manzo Wynetta Emery, MD How to contact the [facility] Attending or Consulting provider [place] or covering provider during after hours [place], for this patient?  1. Check the care team in [facility] and look for a) attending/consulting [facility] provider listed and b) the [place]  team listed 2. Log into [email or web address] and use [facility]'s universal password to access. If you do not have the password, please contact the hospital operator. 3. Locate the [facility] provider you are looking for under [facility] and page to a number that you can be directly reached. 4. If you still have difficulty reaching the provider, please page the [facility] (Director on Call) for the Hospitalists listed on amion for assistance.  12/20/2020, 1:18 PM

## 2020-12-20 NOTE — Progress Notes (Signed)
Pt received to 2A, room 259 from [facility].  Pt oriented to room and call bell.  See admit data base, assessment, vs's and strip for further information.

## 2020-12-21 ENCOUNTER — Other Ambulatory Visit (INDEPENDENT_AMBULATORY_CARE_PROVIDER_SITE_OTHER): Payer: Self-pay | Admitting: Vascular Surgery

## 2020-12-21 DIAGNOSIS — N186 End stage renal disease: Secondary | ICD-10-CM

## 2020-12-21 DIAGNOSIS — I96 Gangrene, not elsewhere classified: Secondary | ICD-10-CM

## 2020-12-21 LAB — COMPREHENSIVE METABOLIC PANEL
ALT: 27 U/L (ref 0–44)
AST: 32 U/L (ref 15–41)
Albumin: 3.1 g/dL — ABNORMAL LOW (ref 3.5–5.0)
Alkaline Phosphatase: 56 U/L (ref 38–126)
Anion gap: 15 (ref 5–15)
BUN: 40 mg/dL — ABNORMAL HIGH (ref 6–20)
CO2: 23 mmol/L (ref 22–32)
Calcium: 8.8 mg/dL — ABNORMAL LOW (ref 8.9–10.3)
Chloride: 97 mmol/L — ABNORMAL LOW (ref 98–111)
Creatinine, Ser: 9.14 mg/dL — ABNORMAL HIGH (ref 0.44–1.00)
GFR, Estimated: 5 mL/min — ABNORMAL LOW (ref >60)
Glucose, Bld: 81 mg/dL (ref 70–99)
Potassium: 4.6 mmol/L (ref 3.5–5.1)
Sodium: 135 mmol/L (ref 135–145)
Total Bilirubin: 0.7 mg/dL (ref 0.3–1.2)
Total Protein: 6.2 g/dL — ABNORMAL LOW (ref 6.5–8.1)

## 2020-12-21 LAB — CBC WITH DIFFERENTIAL/PLATELET
Abs Immature Granulocytes: 1.65 10*3/uL — ABNORMAL HIGH (ref 0.00–0.07)
Basophils Absolute: 0.2 10*3/uL — ABNORMAL HIGH (ref 0.0–0.1)
Basophils Relative: 1 %
Eosinophils Absolute: 0.5 10*3/uL (ref 0.0–0.5)
Eosinophils Relative: 3 %
HCT: 30.8 % — ABNORMAL LOW (ref 36.0–46.0)
Hemoglobin: 9.7 g/dL — ABNORMAL LOW (ref 12.0–15.0)
Immature Granulocytes: 10 %
Lymphocytes Relative: 11 %
Lymphs Abs: 1.7 10*3/uL (ref 0.7–4.0)
MCH: 31.3 pg (ref 26.0–34.0)
MCHC: 31.5 g/dL (ref 30.0–36.0)
MCV: 99.4 fL (ref 80.0–100.0)
Monocytes Absolute: 1.6 10*3/uL — ABNORMAL HIGH (ref 0.1–1.0)
Monocytes Relative: 10 %
Neutro Abs: 10.2 10*3/uL — ABNORMAL HIGH (ref 1.7–7.7)
Neutrophils Relative %: 65 %
Platelets: 141 10*3/uL — ABNORMAL LOW (ref 150–400)
RBC: 3.1 MIL/uL — ABNORMAL LOW (ref 3.87–5.11)
RDW: 16.3 % — ABNORMAL HIGH (ref 11.5–15.5)
Smear Review: NORMAL
WBC: 15.9 10*3/uL — ABNORMAL HIGH (ref 4.0–10.5)
nRBC: 0 % (ref 0.0–0.2)

## 2020-12-21 MED ORDER — METOPROLOL TARTRATE 5 MG/5ML IV SOLN
2.5000 mg | Freq: Once | INTRAVENOUS | Status: AC
  Administered 2020-12-21: 2.5 mg via INTRAVENOUS
  Filled 2020-12-21: qty 5

## 2020-12-21 MED ORDER — CHLORHEXIDINE GLUCONATE CLOTH 2 % EX PADS
6.0000 | MEDICATED_PAD | Freq: Every day | CUTANEOUS | Status: DC
  Administered 2020-12-21 – 2021-01-10 (×18): 6 via TOPICAL

## 2020-12-21 MED ORDER — MORPHINE SULFATE (PF) 2 MG/ML IV SOLN
INTRAVENOUS | Status: AC
  Administered 2020-12-21: 2 mg via INTRAVENOUS
  Filled 2020-12-21: qty 1

## 2020-12-21 MED ORDER — MORPHINE SULFATE (PF) 2 MG/ML IV SOLN
2.0000 mg | INTRAVENOUS | Status: DC | PRN
  Administered 2020-12-21 – 2020-12-29 (×5): 2 mg via INTRAVENOUS
  Filled 2020-12-21 (×5): qty 1

## 2020-12-21 NOTE — H&P (View-Only) (Signed)
[facility] Vascular Consult Note  MRN : [id number]  Lori [name] is a 50 y.o. (1971/01/14) female who presents with chief complaint of transfer from [facility].  History of Present Illness:  Lori [name] is a 50 y.o. female with medical history significant for CAD s/p stent to LAD in 2020, ESRD on HD MWF, hypertension, hypothyroidism, hyperparathyroidism, GERD who was initially admitted to [facility] on 12/14/2020 for a left foot wound.  Patient transferred from independent hospital.  Initially admitted 12/14/2020 for left foot wound.  She was seen by Kand[facility] Cocking general surgery who are concerned she may ultimately need a left AKA.  This was discussed with patient and patient wanted to discuss with her vascular surgeon, Dr. Delana Meyer before any surgery is considered.  The hospitalist at [facility] discussed with patient's vascular surgeon on 12/16/2020 who recommended transfer to [facility] for further evaluation.  That has not been available until today (12/20/2020).  While at [facility], patient incidentally noted to be Covid + 12/14/2020 and was asymptomatic.  She was treated with 3 days IV remdesivir.  She has also been on broad-spectrum IV antibiotic with vancomycin, cefepime, and Flagyl.  Wound culture obtained 12/18/2020 has grown out Enterococcus faecalis and Proteus vulgaris.  Enterococcus faecalis sensitive to ampicillin, gentamicin, vancomycin.  She has continued dialysis on MWF schedule.  Our service is familiar with the patient however have not seen her recently.  Patient was a no-show for her last outpatient appointment on Apr 17, 2018.  The last time we intervened on her left lower extremity via angiogram was on August 19, 2013.  When I tried to discuss the patient's care at [facility] the patient notes that she "was not told anything by doctors".  Vascular surgery was consulted by Dr. Posey Pronto for possible intervention.  Current Facility-Administered  Medications  Medication Dose Route Frequency Provider Last Rate Last Admin  . acetaminophen (TYLENOL) tablet 650 mg  650 mg Oral Q6H PRN Patel, Vishal R, MD      . albuterol (VENTOLIN HFA) 108 (90 Base) MCG/ACT inhaler 1-2 puff  1-2 puff Inhalation Q6H PRN Zada Finders R, MD      . aspirin EC tablet 81 mg  81 mg Oral Daily Zada Finders R, MD   81 mg at 12/21/20 1118  . carvedilol (COREG) tablet 3.125 mg  3.125 mg Oral BID WC Zada Finders R, MD   3.125 mg at 12/21/20 1118  . ceFEPIme (MAXIPIME) 2 g in sodium chloride 0.9 % 100 mL IVPB  2 g Intravenous Q M,W,F-HD Zada Finders R, MD      . Chlorhexidine Gluconate Cloth 2 % PADS 6 each  6 each Topical Daily Aline August, MD   6 each at 12/21/20 1130  . gabapentin (NEURONTIN) capsule 100 mg  100 mg Oral QHS Zada Finders R, MD   100 mg at 12/20/20 2133  . heparin injection 5,000 Units  5,000 Units Subcutaneous Q8H Lenore Cordia, MD   5,000 Units at 12/21/20 0528  . HYDROcodone-acetaminophen (NORCO/VICODIN) 5-325 MG per tablet 1 tablet  1 tablet Oral Q6H PRN Lenore Cordia, MD   1 tablet at 12/20/20 2133  . levothyroxine (SYNTHROID) tablet 300 mcg  300 mcg Oral QHS Patel, Vishal R, MD      . metroNIDAZOLE (FLAGYL) tablet 500 mg  500 mg Oral Q8H Zada Finders R, MD   500 mg at 12/21/20 0528  . morphine 2 MG/ML injection 2 mg  2 mg Intravenous Q2H PRN Athena Masse, MD   2 mg at 12/21/20 1117  . ondansetron (ZOFRAN) tablet 4 mg  4 mg Oral Q6H PRN Lenore Cordia, MD       Or  . ondansetron (ZOFRAN) injection 4 mg  4 mg Intravenous Q6H PRN Zada Finders R, MD      . pantoprazole (PROTONIX) EC tablet 40 mg  40 mg Oral QHS Lenore Cordia, MD   40 mg at 12/20/20 2133  . senna-docusate (Senokot-S) tablet 1 tablet  1 tablet Oral QHS PRN Zada Finders R, MD      . sevelamer carbonate (RENVELA) tablet 1,600 mg  1,600 mg Oral PRN Zada Finders R, MD      . sevelamer carbonate (RENVELA) tablet 3,200 mg  3,200 mg Oral TID WC Patel, Vishal R, MD      .  ticagrelor (BRILINTA) tablet 90 mg  90 mg Oral BID Lenore Cordia, MD   90 mg at 12/21/20 1117  . vancomycin (VANCOCIN) IVPB 1000 mg/200 mL premix  1,000 mg Intravenous Q M,W,F-HD Lenore Cordia, MD       Past Medical History:  Diagnosis Date  . Anemia   . ASCVD (arteriosclerotic cardiovascular disease)    a. s/p prior stenting of LAD b. NSTEMI in 01/2018 requiring DES x3 to RCA given spiral dissection from ostium to mid-RCA but residual disease along distal RCA, LAD and 2nd Mrg  . Calciphylaxis   . Chronic abdominal wound infection   . Dialysis patient ([place])   . ESRD (end stage renal disease) ([place])    Due to membranous GN dialysis 09/1996; peritoneal dialysis --? peitonitis; difficult vascular access  . GERD (gastroesophageal reflux disease)   . Hashimoto thyroiditis   . Hyperparathyroidism   . Hypertension   . Hypothyroidism   . Medically noncompliant   . Morbid obesity ([place])    Past Surgical History:  Procedure Laterality Date  . [place]  . CORONARY STENT INTERVENTION N/A 10/21/2019   Procedure: CORONARY STENT INTERVENTION;  Surgeon: Wellington Hampshire, MD;  Location: [place] [facility];  Service: Cardiovascular;  Laterality: N/A;  . LEFT HEART CATH AND CORONARY ANGIOGRAPHY N/A 02/11/2018   Procedure: LEFT HEART CATH AND CORONARY ANGIOGRAPHY;  Surgeon: Charolette Forward, MD;  Location: [place] [facility];  Service: Cardiovascular;  Laterality: N/A;  . LEFT HEART CATH AND CORONARY ANGIOGRAPHY N/A 10/14/2019   Procedure: LEFT HEART CATH AND CORONARY ANGIOGRAPHY;  Surgeon: Wellington Hampshire, MD;  Location: [place] [facility];  Service: Cardiovascular;  Laterality: N/A;  . THYROIDECTOMY, PARTIAL     Resectin of left lobe with reimplantation in the forearm, small focus of papillary carcinoma incidentally noted at pathology - 2000 and Hashimoto's thyrdoiditis in 2001  . TONSILLECTOMY AND ADENOIDECTOMY     Social History Social History   Tobacco Use  . Smoking status: Former Research scientist (life sciences)   . Smokeless tobacco: Never Used  Vaping Use  . Vaping Use: Never used  Substance Use Topics  . Alcohol use: No  . Drug use: No   Family History Family History  Problem Relation Age of Onset  . Coronary artery disease Mother   . Kidney disease Father   . Diabetes Sister   Positive for kidney disease paternal.  Denies any peripheral artery disease venous disease.  Allergies  Allergen Reactions  . Activase [Alteplase] Shortness Of Breath  . Bee Pollen Anaphylaxis  . Warfarin Sodium Nausea And Vomiting and Rash  REVIEW OF SYSTEMS (Negative unless checked)  Constitutional: '[]'$ Weight loss  '[]'$ Fever  '[]'$ Chills Cardiac: '[]'$ Chest pain   '[]'$ Chest pressure   '[]'$ Palpitations   '[]'$ Shortness of breath when laying flat   '[]'$ Shortness of breath at rest   '[]'$ Shortness of breath with exertion. Vascular:  '[]'$ Pain in legs with walking   '[]'$ Pain in legs at rest   '[]'$ Pain in legs when laying flat   '[]'$ Claudication   '[]'$ Pain in feet when walking  '[]'$ Pain in feet at rest  '[]'$ Pain in feet when laying flat   '[]'$ History of DVT   '[]'$ Phlebitis   '[]'$ Swelling in legs   '[]'$ Varicose veins   '[x]'$ Non-healing ulcers Pulmonary:   '[]'$ Uses home oxygen   '[]'$ Productive cough   '[]'$ Hemoptysis   '[]'$ Wheeze  '[]'$ COPD   '[]'$ Asthma Neurologic:  '[]'$ Dizziness  '[]'$ Blackouts   '[]'$ Seizures   '[]'$ History of stroke   '[]'$ History of TIA  '[]'$ Aphasia   '[]'$ Temporary blindness   '[]'$ Dysphagia   '[]'$ Weakness or numbness in arms   '[]'$ Weakness or numbness in legs Musculoskeletal:  '[]'$ Arthritis   '[]'$ Joint swelling   '[]'$ Joint pain   '[]'$ Low back pain Hematologic:  '[]'$ Easy bruising  '[]'$ Easy bleeding   '[]'$ Hypercoagulable state   '[]'$ Anemic  '[]'$ Hepatitis Gastrointestinal:  '[]'$ Blood in stool   '[]'$ Vomiting blood  '[]'$ Gastroesophageal reflux/heartburn   '[]'$ Difficulty swallowing. Genitourinary:  '[x]'$ Chronic kidney disease   '[]'$ Difficult urination  '[]'$ Frequent urination  '[]'$ Burning with urination   '[]'$ Blood in urine Skin:  '[]'$ Rashes   '[x]'$ Ulcers   '[x]'$ Wounds Psychological:  '[]'$ History of anxiety   '[]'$  History of major  depression.  Physical Examination  Vitals:   12/21/20 0112 12/21/20 0242 12/21/20 0258 12/21/20 1128  BP:  124/74  126/86  Pulse:  (!) 115 89 82  Resp:  18  14  Temp:  98 F (36.7 C)  97.6 F (36.4 C)  TempSrc:    Oral  SpO2:  96%  100%  Weight: 109.2 kg     Height:       Body mass index is 42.66 kg/m. Gen:  WD/WN, NAD Head: [place]/AT, No temporalis wasting. Prominent temp pulse not noted. Ear/Nose/Throat: Hearing grossly intact, nares w/o erythema or drainage, oropharynx w/o Erythema/Exudate Eyes: Sclera non-icteric, conjunctiva clear Neck: Trachea midline.  No JVD.  Pulmonary:  Good air movement, respirations not labored, equal bilaterally.  Cardiac: RRR, normal S1, S2. Vascular:  Vessel Right Left  Radial Palpable Palpable  Ulnar Palpable Palpable  Brachial Palpable Palpable  Carotid Palpable, without bruit Palpable, without bruit  Aorta Not palpable N/A  Femoral Palpable Palpable  Popliteal Palpable Palpable  PT Palpable Non-Palpable  DP Palpable Non-Palpable   Gastrointestinal: soft, non-tender/non-distended. No guarding/reflex.  Musculoskeletal: M/S 5/5 throughout.  Extremities without ischemic changes.  No deformity or atrophy. No edema. Neurologic: Sensation grossly intact in extremities.  Symmetrical.  Speech is fluent. Motor exam as listed above. Psychiatric: Judgment intact, Mood & affect appropriate for pt's clinical situation. Dermatologic: Necrosis to left foot  Lymph : No Cervical, Axillary, or Inguinal lymphadenopathy.  CBC Lab Results  Component Value Date   WBC 15.9 (H) 12/21/2020   HGB 9.7 (L) 12/21/2020   HCT 30.8 (L) 12/21/2020   MCV 99.4 12/21/2020   PLT 141 (L) 12/21/2020   BMET    Component Value Date/Time   NA 135 12/21/2020 0440   NA 138 03/26/2014 1425   K 4.6 12/21/2020 0440   K 3.5 03/26/2014 1425   CL 97 (L) 12/21/2020 0440   CL 99 03/26/2014 1425   CO2 23 12/21/2020 0440  CO2 26 03/26/2014 1425   GLUCOSE 81 12/21/2020 0440    GLUCOSE 79 03/26/2014 1425   BUN 40 (H) 12/21/2020 0440   BUN 36 (H) 03/26/2014 1425   CREATININE 9.14 (H) 12/21/2020 0440   CREATININE 9.54 (H) 03/26/2014 1425   CALCIUM 8.8 (L) 12/21/2020 0440   CALCIUM 7.7 (L) 03/26/2014 1425   GFRNONAA 5 (L) 12/21/2020 0440   GFRNONAA 4 (L) 03/26/2014 1425   GFRAA 7 (L) 10/22/2019 0240   GFRAA 5 (L) 03/26/2014 1425   Estimated Creatinine Clearance: 8.6 mL/min (A) (by C-G formula based on SCr of 9.14 mg/dL (H)).  COAG Lab Results  Component Value Date   INR 1.13 02/09/2018   INR 1.25 03/27/2014   INR 1.1 03/26/2014   Radiology CT ABDOMEN PELVIS WO CONTRAST  Result Date: 12/14/2020 CLINICAL DATA:  Generalized abdominal pain following fall, initial encounter EXAM: CT ABDOMEN AND PELVIS WITHOUT CONTRAST TECHNIQUE: Multidetector CT imaging of the abdomen and pelvis was performed following the standard protocol without IV contrast. COMPARISON:  05/03/2013 FINDINGS: Lower chest: No acute abnormality. Heavy coronary calcifications are noted. Hepatobiliary: Tiny dependent gallstones are noted. The gallbladder is otherwise within normal limits. Liver is unremarkable. Pancreas: Unremarkable. No pancreatic ductal dilatation or surrounding inflammatory changes. Spleen: Normal in size without focal abnormality. Adrenals/Urinary Tract: Adrenal glands are within normal limits. Kidneys demonstrate significant cortical thinning with multiple cysts the largest of which lies in the lower pole of the right kidney measuring 5.8 cm in greatest dimension. This has increased in size from the prior exam at which time it measured 4.6 cm. No obstructive changes are seen. No definitive renal calculi are noted. The bladder is decompressed. These changes are consistent with the patient's known history of end-stage renal failure. Stomach/Bowel: Colon is well visualized without obstructive or inflammatory changes. The appendix is within normal limits. No small bowel or gastric  abnormality is noted. Vascular/Lymphatic: Aortic atherosclerosis. No enlarged abdominal or pelvic lymph nodes. Left retroaortic renal vein is noted. Multiple collaterals are noted in the anterior abdominal wall likely related to central venous stenosis. These are stable from the prior exam. Calcified graft in the left thigh proximally is noted. Reproductive: Uterus and bilateral adnexa are unremarkable. Other: No abdominal wall hernia or abnormality. No abdominopelvic ascites. Musculoskeletal: No acute or significant osseous findings. IMPRESSION: Cholelithiasis without complicating factors. Changes consistent with end-stage renal failure with multiple renal cysts bilaterally and diffuse cortical thinning. Aortic atherosclerotic changes. Multiple venous collaterals in the anterior abdominal wall consistent with central venous stenosis likely related to the known dialysis. No acute abnormality related to the recent injury is noted. Electronically Signed   By: Inez Catalina M.D.   On: 12/14/2020 17:10   DG Chest 2 View  Result Date: 12/14/2020 CLINICAL DATA:  Pain following fall EXAM: CHEST - 2 VIEW COMPARISON:  October 12, 2019 chest radiograph and chest CT October 16, 2019 FINDINGS: Central catheter tip is in the superior vena cava. No pneumothorax. There is no edema or airspace opacity. Heart is mildly enlarged with pulmonary vascularity normal. No adenopathy. No bone lesions. Postoperative change in the thyroid region noted. IMPRESSION: Central catheter as described without pneumothorax. Lungs clear. Heart mildly enlarged. Electronically Signed   By: Lowella Grip III M.D.   On: 12/14/2020 15:16   CT Head Wo Contrast  Result Date: 12/14/2020 CLINICAL DATA:  Fall.  Hit head. EXAM: CT HEAD WITHOUT CONTRAST CT CERVICAL SPINE WITHOUT CONTRAST TECHNIQUE: Multidetector CT imaging of the head and cervical spine  was performed following the standard protocol without intravenous contrast. Multiplanar CT image  reconstructions of the cervical spine were also generated. COMPARISON:  CT head dated June 15, 2010. FINDINGS: CT HEAD FINDINGS Brain: No evidence of acute infarction, hemorrhage, hydrocephalus, extra-axial collection or mass lesion/mass effect. Tiny old lacunar infarct in the right basal ganglia. Vascular: Calcified atherosclerosis at the skullbase. No hyperdense vessel. Skull: Thickened skull with diffuse sclerosis and patchy sclerotic foci, consistent with renal osteodystrophy. No fracture. Sinuses/Orbits: No acute finding. Other: None. CT CERVICAL SPINE FINDINGS Alignment: Mild reversal of the normal cervical lordosis. No traumatic malalignment. Skull base and vertebrae: No acute fracture. No primary bone lesion or focal pathologic process. Soft tissues and spinal canal: No prevertebral fluid or swelling. No visible canal hematoma. Disc levels: Mild disc height loss at C6-C7. Mild right uncovertebral hypertrophy at C2-C3 and C3-C4. Upper chest: Negative. Other: Prior thyroidectomy. IMPRESSION: 1. No acute intracranial abnormality. Old right basal ganglia lacunar infarct. 2. No acute cervical spine fracture or traumatic listhesis. 3. Renal osteodystrophy. Electronically Signed   By: Titus Dubin M.D.   On: 12/14/2020 15:15   CT Cervical Spine Wo Contrast  Result Date: 12/14/2020 CLINICAL DATA:  Fall.  Hit head. EXAM: CT HEAD WITHOUT CONTRAST CT CERVICAL SPINE WITHOUT CONTRAST TECHNIQUE: Multidetector CT imaging of the head and cervical spine was performed following the standard protocol without intravenous contrast. Multiplanar CT image reconstructions of the cervical spine were also generated. COMPARISON:  CT head dated June 15, 2010. FINDINGS: CT HEAD FINDINGS Brain: No evidence of acute infarction, hemorrhage, hydrocephalus, extra-axial collection or mass lesion/mass effect. Tiny old lacunar infarct in the right basal ganglia. Vascular: Calcified atherosclerosis at the skullbase. No hyperdense vessel.  Skull: Thickened skull with diffuse sclerosis and patchy sclerotic foci, consistent with renal osteodystrophy. No fracture. Sinuses/Orbits: No acute finding. Other: None. CT CERVICAL SPINE FINDINGS Alignment: Mild reversal of the normal cervical lordosis. No traumatic malalignment. Skull base and vertebrae: No acute fracture. No primary bone lesion or focal pathologic process. Soft tissues and spinal canal: No prevertebral fluid or swelling. No visible canal hematoma. Disc levels: Mild disc height loss at C6-C7. Mild right uncovertebral hypertrophy at C2-C3 and C3-C4. Upper chest: Negative. Other: Prior thyroidectomy. IMPRESSION: 1. No acute intracranial abnormality. Old right basal ganglia lacunar infarct. 2. No acute cervical spine fracture or traumatic listhesis. 3. Renal osteodystrophy. Electronically Signed   By: Titus Dubin M.D.   On: 12/14/2020 15:15   US ARTERIAL ABI (SCREENING LOWER EXTREMITY)  Result Date: 12/15/2020 CLINICAL DATA:  50 year old female with a history cellulitis of the fourth EXAM: NONINVASIVE PHYSIOLOGIC VASCULAR STUDY OF BILATERAL LOWER EXTREMITIES TECHNIQUE: Evaluation of both lower extremities was performed at rest, including calculation of ankle-brachial indices, multiple segmental pressure evaluation, segmental Doppler and segmental pulse volume recording. COMPARISON:  None. FINDINGS: Right ABI:  Not acquired Left ABI:  Not acquired Right Lower Extremity: Segmental Doppler at the right ankle demonstrates no signal of the posterior tibial artery and monophasic dorsalis pedis. Left Lower Extremity: No signal identified within the posterior tibial artery or dorsalis pedis on the left. IMPRESSION: Resting ABI the bilateral lower extremities was not performed. Monophasic waveform of the dorsalis pedis on the right with no signal of the posterior tibial artery. On the left no signal was identified within the tibial arteries at the ankle. Signed, Dulcy Fanny. Dellia Nims, RPVI Vascular and  [facility] Rad[facility] Radiology Electronically Signed   By: Corrie Mckusick D.O.   On: 12/15/2020 11:35  US ARTERIAL LOWER EXTREMITY DUPLEX LEFT (NON-ABI)  Result Date: 12/15/2020 CLINICAL DATA:  50 year old female with a left foot wound EXAM: NONINVASIVE PHYSIOLOGIC VASCULAR STUDY OF UNILATERAL LOWER EXTREMITIES TECHNIQUE: Evaluation of left lower extremities was performed at rest, including directed duplex. COMPARISON:  None. FINDINGS: Left ABI: Not acquired Left Lower Extremity: Directed duplex left lower extremity demonstrates monophasic waveform of the common femoral artery, superficial femoral artery, popliteal artery. Monophasic anterior tibial artery proximally. No signal distally. No signal of the distal peroneal artery or posterior tibial artery. IMPRESSION: Directed duplex demonstrates monophasic waveform of the common femoral artery, profunda femoris, SFA, popliteal artery. There is no signal identified within the distal anterior tibial artery, posterior tibial artery, peroneal artery. Signed, Dulcy Fanny. Dellia Nims, RPVI [facility]ascular [facility] Inter[facility] Radiology Special[facility] Radiology Electronically Signed   By: Corrie Mckusick D.O.   On: 12/15/2020 15:59   DG Foot Complete Left  Result Date: 12/14/2020 CLINICAL DATA:  Wound on foot near fourth and fifth MTP EXAM: LEFT FOOT - COMPLETE 3+ VIEW COMPARISON:  None. FINDINGS: No fracture or malalignment. Vascular calcifications. No soft tissue emphysema or radiopaque foreign body. IMPRESSION: No acute osseous abnormality. Electronically Signed   By: Donavan Foil M.D.   On: 12/14/2020 15:18   ECHOCARDIOGRAM COMPLETE  Result Date: 12/16/2020    ECHOCARDIOGRAM REPORT   Patient Name:   Lori [name] Date of Exam: 12/16/2020 Medical Rec #:  [id number]    Height:       63.0 in Accession #:    [id number]   Weight:       242.3 lb Date of Birth:  13-Aug-1971    BSA:          2.098 m Patient Age:    28 years     BP:           129/68  mmHg Patient Gender: F            HR:           74 bpm. Exam Location:  [name] Procedure: 2D Echo Indications:    Abnormal ECG R94.31  History:        Patient has prior history of Echocardiogram examinations, most                 recent 10/13/2019. TIA, Arrythmias:Atrial Fibrillation,                 Signs/Symptoms:Syncope and Chest Pain; Risk Factors:Dyslipidemia                 and Former Smoker. ESRD.  Sonographer:    Leavy Cella RDCS (AE) Referring Phys: [place]  1. Left ventricular ejection fraction, by estimation, is 55 to 60%. The left ventricle has normal function. Left ventricular endocardial border not optimally defined to evaluate regional wall motion. There is moderate left ventricular hypertrophy. Left ventricular diastolic parameters are consistent with Grade II diastolic dysfunction (pseudonormalization).  2. Right ventricular systolic function is normal. The right ventricular size is normal. Tricuspid regurgitation signal is inadequate for assessing PA pressure.  3. Left atrial size was severely dilated.  4. The mitral valve is abnormal. Trivial mitral valve regurgitation. Moderate to severe mitral annular calcification.  5. The aortic valve is tricuspid. There is moderate calcification of the aortic valve. Aortic valve regurgitation is not visualized. Mild to moderate aortic valve sclerosis/calcification is present, without any evidence of aortic stenosis. Aortic valve mean gradient measures 8.3 mmHg. Aortic valve Vmax measures  2.02 m/s.  6. The inferior vena cava is normal in size with greater than 50% respiratory variability, suggesting right atrial pressure of 3 mmHg. FINDINGS  Left Ventricle: Left ventricular ejection fraction, by estimation, is 55 to 60%. The left ventricle has normal function. Left ventricular endocardial border not optimally defined to evaluate regional wall motion. The left ventricular internal cavity size was small. There is moderate left  ventricular hypertrophy. Left ventricular diastolic parameters are consistent with Grade II diastolic dysfunction (pseudonormalization). Right Ventricle: The right ventricular size is normal. No increase in right ventricular wall thickness. Right ventricular systolic function is normal. Tricuspid regurgitation signal is inadequate for assessing PA pressure. Left Atrium: Left atrial size was severely dilated. Right Atrium: Right atrial size was normal in size. Pericardium: There is no evidence of pericardial effusion. Presence of pericardial fat pad. Mitral Valve: The mitral valve is abnormal. There is mild thickening of the mitral valve leaflet(s). There is mild calcification of the mitral valve leaflet(s). Moderate to severe mitral annular calcification. Trivial mitral valve regurgitation. Tricuspid Valve: The tricuspid valve is grossly normal. Tricuspid valve regurgitation is trivial. Aortic Valve: The aortic valve is tricuspid. There is moderate calcification of the aortic valve. There is moderate aortic valve annular calcification. Aortic valve regurgitation is not visualized. Mild to moderate aortic valve sclerosis/calcification is  present, without any evidence of aortic stenosis. Aortic valve mean gradient measures 8.3 mmHg. Aortic valve peak gradient measures 16.3 mmHg. Aortic valve area, by VTI measures 2.51 cm. Pulmonic Valve: The pulmonic valve was not well visualized. Pulmonic valve regurgitation is not visualized. Aorta: The aortic root is normal in size and structure. Venous: The inferior vena cava is normal in size with greater than 50% respiratory variability, suggesting right atrial pressure of 3 mmHg. IAS/Shunts: No atrial level shunt detected by color flow Doppler.  LEFT VENTRICLE PLAX 2D LVIDd:         2.61 cm  Diastology LVIDs:         1.80 cm  LV e' medial:    4.20 cm/s LV PW:         1.59 cm  LV E/e' medial:  29.0 LV IVS:        1.52 cm  LV e' lateral:   3.00 cm/s LVOT diam:     2.10 cm  LV  E/e' lateral: 40.7 LV SV:         103 LV SV Index:   49 LVOT Area:     3.46 cm  RIGHT VENTRICLE RV S prime:     9.49 cm/s TAPSE (M-mode): 1.7 cm LEFT ATRIUM              Index       RIGHT ATRIUM           Index LA diam:        3.60 cm  1.72 cm/m  RA Area:     10.80 cm LA Vol (A2C):   44.6 ml  21.26 ml/m RA Volume:   21.80 ml  10.39 ml/m LA Vol (A4C):   108.0 ml 51.48 ml/m LA Biplane Vol: 73.1 ml  34.84 ml/m  AORTIC VALVE AV Area (Vmax):    2.31 cm AV Area (Vmean):   2.63 cm AV Area (VTI):     2.51 cm AV Vmax:           201.67 cm/s AV Vmean:          130.333 cm/s AV VTI:  0.410 m AV Peak Grad:      16.3 mmHg AV Mean Grad:      8.3 mmHg LVOT Vmax:         134.33 cm/s LVOT Vmean:        99.033 cm/s LVOT VTI:          0.297 m LVOT/AV VTI ratio: 0.72  AORTA Ao Root diam: 2.90 cm MITRAL VALVE MV Area (PHT): 2.01 cm     SHUNTS MV Decel Time: 378 msec     Systemic VTI:  0.30 m MV E velocity: 122.00 cm/s  Systemic Diam: 2.10 cm MV A velocity: 49.60 cm/s MV E/A ratio:  2.46 Rozann Lesches MD Electronically signed by Rozann Lesches MD Signature Date/Time: 12/16/2020/11:20:42 AM    Final    DG Hips Bilat W or Wo Pelvis 3-4 Views  Result Date: 12/14/2020 CLINICAL DATA:  Fall, initial encounter. EXAM: DG HIP (WITH OR WITHOUT PELVIS) 3-4V BILAT COMPARISON:  None. FINDINGS: Image quality is degraded by body habitus. No acute osseous or joint abnormality. Vascular stent is seen in the left femoral vasculature. IMPRESSION: Image quality is degraded by body habitus. No definite acute osseous or joint abnormality. Electronically Signed   By: Lorin Picket M.D.   On: 12/14/2020 15:17   Assessment/Plan ADHYA DLUGOSZ is a 50 year old female with medical history significant for CAD s/p stent to LAD in 2020, ESRD on HD MWF, hypertension, hypothyroidism, hyperparathyroidism, GERD who was initially admitted to [facility] on 12/14/2020 for a left foot wound.  1.  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 During conversation with Dr. Delana Meyer on Friday, [facility] staff had reached out for transfer - during the conversation Dr. Delana Meyer mentioned he would be happy to take care of the patient however did not feel that [name] would not be able to provide care for her especially if she was refusing treatment. However, patient still transferred.  Our service is familiar with the patient however have not seen her recently.  Patient was a no-show for her last outpatient appointment on Apr 17, 2018.  The last time we intervened on her left lower extremity via angiogram was on August 19, 2013.  In the setting of gangrene to the left toes can offer the patient an angiogram and attempt assess her anatomy and contributing degree of peripheral artery disease.  Procedure, risks and benefits were explained to the patient.  All questions were answered.  Patient wishes to proceed.  We will plan on this tomorrow.  2.  COVID-19: Currently on precautions  3.  End-stage renal disease: Have not seen the patient since 2019 in regard to her dialysis access Seems to be functioning appropriately Followed by nephrology  Discussed with Dr. Mayme Genta, PA-C  12/21/2020 1:37 PM  This note was created with Dragon medical transcription system.  Any error is purely unintentional.

## 2020-12-21 NOTE — Progress Notes (Signed)
Vanc scheduled to be given while patient is in dialysis.  Dialysis was not done today.  Per nephrologist- hold vanc today.

## 2020-12-21 NOTE — Consult Note (Signed)
[facility]  CONSULT NOTE    Date: 12/21/2020                  Patient Name:  Lori [name]  MRN: [id number]  DOB: August 18, 1971  Age / Sex: 50 y.o., female         PCP: Patient, No Pcp Per                 Service Requesting Consult: Dr. Starla Link                 Reason for Consult: ESRD            History of Present Illness: [name] is a transfer from [facility] for left cellulitis of lower extremity. Patient's last dialysis was Monday. Patient states her dialysis is going well. Currently with a four hour treatment. She has been on dialysis for 6 years.   COVID positive.    Medications: Outpatient medications: Medications Prior to Admission  Medication Sig Dispense Refill Last Dose  . albuterol (PROVENTIL HFA;VENTOLIN HFA) 108 (90 BASE) MCG/ACT inhaler Inhale 2 puffs into the lungs every 6 (six) hours as needed. Shortness of breath   Past Week at Unknown time  . aspirin EC 81 MG tablet Take 81 mg by mouth daily. Swallow whole.   12/20/2020 at Unknown time  . b complex-vitamin c-folic acid (NEPHRO-VITE) 0.8 MG TABS tablet Take 1 tablet by mouth daily.   12/20/2020 at Unknown time  . calcitRIOL (ROCALTROL) 0.25 MCG capsule Take 3 capsules (0.75 mcg total) by mouth every Monday, Wednesday, and Friday with hemodialysis.   12/20/2020 at Unknown time  . ceFEPIme 2 g in sodium chloride 0.9 % 100 mL Inject 2 g into the vein every Monday, Wednesday, and Friday with hemodialysis.   12/20/2020 at Unknown time  . gabapentin (NEURONTIN) 100 MG capsule Take 1 capsule (100 mg total) by mouth at bedtime.   12/19/2020 at Unknown time  . HYDROcodone-acetaminophen (NORCO/VICODIN) 5-325 MG tablet TAKE (1) TABLET BY MOUTH EVERY (6) HOURS AS NEEDED. 18 tablet 0 12/20/2020 at Unknown time  . levothyroxine (SYNTHROID, LEVOTHROID) 300 MCG tablet Take 300 mcg by mouth at bedtime. SYNTHROID ONLY-BRAND NAME MEDICALLY NECESSARY   12/19/2020 at Unknown time  . metroNIDAZOLE (FLAGYL) 500 MG  tablet Take 1 tablet (500 mg total) by mouth every 8 (eight) hours.   12/19/2020 at Unknown time  . midodrine (PROAMATINE) 10 MG tablet Take 1 tablet (10 mg total) by mouth 3 (three) times daily with meals.   Past Month at Unknown time  . nitroGLYCERIN (NITROSTAT) 0.4 MG SL tablet Place 1 tablet (0.4 mg total) under the tongue every 5 (five) minutes x 3 doses as needed for chest pain. 25 tablet 12 Past Week at Unknown time  . nystatin (MYCOSTATIN/NYSTOP) powder Apply topically 2 (two) times daily. 15 g 0 12/20/2020 at Unknown time  . pantoprazole (PROTONIX) 40 MG tablet Take 1 tablet (40 mg total) by mouth at bedtime.   12/20/2020 at Unknown time  . polyethylene glycol (MIRALAX / GLYCOLAX) 17 g packet Take 17 g by mouth 2 (two) times daily. 14 each 0 12/19/2020 at Unknown time  . sevelamer carbonate (RENVELA) 800 MG tablet Take 1,600-3,200 mg by mouth 3 (three) times daily with meals. '3200mg'$  with meals and '1600mg'$  with snacks   12/20/2020 at Unknown time  . ticagrelor (BRILINTA) 90 MG TABS tablet Take 1 tablet (90 mg total) by mouth 2 (two) times daily. [place]  tablet 3 12/20/2020 at Unknown time  . vancomycin (VANCOCIN) 1-5 GM/200ML-% SOLN Inject 200 mLs (1,000 mg total) into the vein every Monday, Wednesday, and Friday with hemodialysis. 4000 mL  12/19/2020 at Unknown time  . EPINEPHrine 0.3 mg/0.3 mL IJ SOAJ injection Inject 0.3 mg into the muscle as needed.     . fentaNYL (SUBLIMAZE) 100 MCG/2ML injection Inject 0.25 mLs (12.5 mcg total) into the vein every 3 (three) hours as needed for severe pain. 2 mL 0     Current medications: Current Facility-Administered Medications  Medication Dose Route Frequency Provider Last Rate Last Admin  . acetaminophen (TYLENOL) tablet 650 mg  650 mg Oral Q6H PRN Patel, Vishal R, MD      . albuterol (VENTOLIN HFA) 108 (90 Base) MCG/ACT inhaler 1-2 puff  1-2 puff Inhalation Q6H PRN Zada Finders R, MD      . aspirin EC tablet 81 mg  81 mg Oral Daily Zada Finders R, MD   81 mg  at 12/21/20 1118  . carvedilol (COREG) tablet 3.125 mg  3.125 mg Oral BID WC Zada Finders R, MD   3.125 mg at 12/21/20 1118  . ceFEPIme (MAXIPIME) 2 g in sodium chloride 0.9 % 100 mL IVPB  2 g Intravenous Q M,W,F-HD Zada Finders R, MD 200 mL/hr at 12/21/20 1444 2 g at 12/21/20 1444  . Chlorhexidine Gluconate Cloth 2 % PADS 6 each  6 each Topical Daily Aline August, MD   6 each at 12/21/20 1130  . gabapentin (NEURONTIN) capsule 100 mg  100 mg Oral QHS Zada Finders R, MD   100 mg at 12/20/20 2133  . heparin injection 5,000 Units  5,000 Units Subcutaneous Q8H Lenore Cordia, MD   5,000 Units at 12/21/20 1452  . HYDROcodone-acetaminophen (NORCO/VICODIN) 5-325 MG per tablet 1 tablet  1 tablet Oral Q6H PRN Lenore Cordia, MD   1 tablet at 12/20/20 2133  . levothyroxine (SYNTHROID) tablet 300 mcg  300 mcg Oral QHS Patel, Vishal R, MD      . metroNIDAZOLE (FLAGYL) tablet 500 mg  500 mg Oral Q8H Zada Finders R, MD   500 mg at 12/21/20 1437  . morphine 2 MG/ML injection 2 mg  2 mg Intravenous Q2H PRN Athena Masse, MD   2 mg at 12/21/20 1117  . ondansetron (ZOFRAN) tablet 4 mg  4 mg Oral Q6H PRN Lenore Cordia, MD       Or  . ondansetron (ZOFRAN) injection 4 mg  4 mg Intravenous Q6H PRN Zada Finders R, MD      . pantoprazole (PROTONIX) EC tablet 40 mg  40 mg Oral QHS Lenore Cordia, MD   40 mg at 12/20/20 2133  . senna-docusate (Senokot-S) tablet 1 tablet  1 tablet Oral QHS PRN Zada Finders R, MD      . sevelamer carbonate (RENVELA) tablet 1,600 mg  1,600 mg Oral PRN Zada Finders R, MD      . sevelamer carbonate (RENVELA) tablet 3,200 mg  3,200 mg Oral TID WC Zada Finders R, MD   3,200 mg at 12/21/20 1436  . ticagrelor (BRILINTA) tablet 90 mg  90 mg Oral BID Lenore Cordia, MD   90 mg at 12/21/20 1117  . vancomycin (VANCOCIN) IVPB 1000 mg/200 mL premix  1,000 mg Intravenous Q M,W,F-HD Lenore Cordia, MD          Allergies: Allergies  Allergen Reactions  . Activase [Alteplase]  Shortness Of Breath  . Bee  Pollen Anaphylaxis  . Warfarin Sodium Nausea And Vomiting and Rash      Past Medical History: Past Medical History:  Diagnosis Date  . Anemia   . ASCVD (arteriosclerotic cardiovascular disease)    a. s/p prior stenting of LAD b. NSTEMI in 01/2018 requiring DES x3 to RCA given spiral dissection from ostium to mid-RCA but residual disease along distal RCA, LAD and 2nd Mrg  . Calciphylaxis   . Chronic abdominal wound infection   . Dialysis patient ([place])   . 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 peitonitis; difficult vascular access  . GERD (gastroesophageal reflux disease)   . Hashimoto thyroiditis   . Hyperparathyroidism   . Hypertension   . Hypothyroidism   . Medically noncompliant   . Morbid obesity ([place])      Past Surgical History: Past Surgical History:  Procedure Laterality Date  . [place]  . CORONARY STENT INTERVENTION N/A 10/21/2019   Procedure: CORONARY STENT INTERVENTION;  Surgeon: Wellington Hampshire, MD;  Location: [facility];  Service: Cardiovascular;  Laterality: N/A;  . LEFT HEART CATH AND CORONARY ANGIOGRAPHY N/A 02/11/2018   Procedure: LEFT HEART CATH AND CORONARY ANGIOGRAPHY;  Surgeon: Charolette Forward, MD;  Location: [facility];  Service: Cardiovascular;  Laterality: N/A;  . LEFT HEART CATH AND CORONARY ANGIOGRAPHY N/A 10/14/2019   Procedure: LEFT HEART CATH AND CORONARY ANGIOGRAPHY;  Surgeon: Wellington Hampshire, MD;  Location: [facility];  Service: Cardiovascular;  Laterality: N/A;  . THYROIDECTOMY, PARTIAL     Resectin of left lobe with reimplantation in the forearm, small focus of papillary carcinoma incidentally noted at pathology - 2000 and Hashimoto's thyrdoiditis in 2001  . TONSILLECTOMY AND ADENOIDECTOMY       Family History: Family History  Problem Relation Age of Onset  . Coronary artery disease Mother   . Kidney disease Father   .  Diabetes Sister      Social History: Social History   Socioeconomic History  . Marital status: Married    Spouse name: Not on file  . Number of children: Not on file  . Years of education: Not on file  . Highest education level: Not on file  Occupational History  . Not on file  Tobacco Use  . Smoking status: Former Research scientist (life sciences)  . Smokeless tobacco: Never Used  Vaping Use  . Vaping Use: Never used  Substance and Sexual Activity  . Alcohol use: No  . Drug use: No  . Sexual activity: Yes    Birth control/protection: Surgical  Other Topics Concern  . Not on file  Social History Narrative   Married   No regular exercise   Social Determinants of Health   Financial Resource Strain: Not on file  Food Insecurity: Not on file  Transportation Needs: Not on file  Physical Activity: Not on file  Stress: Not on file  Social Connections: Not on file  Intimate Partner Violence: Not on file     Review of Systems: Review of Systems  Constitutional: Negative.   HENT: Negative.   Eyes: Negative.   Respiratory: Negative.  Negative for cough, hemoptysis, sputum production, shortness of breath and wheezing.   Cardiovascular: Negative.  Negative for chest pain, palpitations, orthopnea, claudication, leg swelling and PND.  Gastrointestinal: Negative.   Genitourinary: Negative.  Negative for dysuria, flank pain, frequency, hematuria and urgency.  Musculoskeletal: Negative for back pain, falls, joint pain, myalgias  and neck pain.  Skin: Negative.   Neurological: Negative.   Endo/Heme/Allergies: Negative.   Psychiatric/Behavioral: Negative.     Vital Signs: Blood pressure 126/86, pulse 82, temperature 97.6 F (36.4 C), temperature source Oral, resp. rate 14, height '5\' 3"'$  (1.6 m), weight 109.2 kg, SpO2 100 %.  Weight trends:   Danley Danker Weights   12/20/20 1858 12/21/20 0112  Weight: 102.9 kg 109.2 kg    Physical Exam: General: NAD,   Head: Normocephalic, atraumatic. Moist oral  mucosal membranes  Eyes: Anicteric, PERRL  Neck: Supple, trachea midline  Lungs:  Clear to auscultation  Heart: Regular rate and rhythm  Abdomen:  Soft, nontender,   Extremities:  left foot with ischemia of fourth and fifth toe  Neurologic: Nonfocal, moving all four extremities  Skin: No lesions  Access: RIJ permcath     Lab results: Basic Metabolic Panel: Recent Labs  Lab 12/18/20 0411 12/19/20 0826 12/20/20 0328 12/21/20 0440  NA 134* 131* 135 135  K 4.8 5.3* 4.4 4.6  CL 95* 96* 95* 97*  CO2 21* 18* 24 23  GLUCOSE 82 89 107* 81  BUN 43* 59* 32* 40*  CREATININE 9.09* 11.56* 7.49* 9.14*  CALCIUM 8.5* 8.5* 8.5* 8.8*  PHOS 7.0* 7.5* 4.7*  --     Liver Function Tests: Recent Labs  Lab 12/14/20 1715 12/16/20 0417 12/19/20 0826 12/20/20 0328 12/21/20 0440  AST 65*  --   --   --  32  ALT 27  --   --   --  27  ALKPHOS 50  --   --   --  56  BILITOT 0.6  --   --   --  0.7  PROT 7.1  --   --   --  6.2*  ALBUMIN 3.5   < > 3.2* 3.1* 3.1*   < > = values in this interval not displayed.   No results for input(s): LIPASE, AMYLASE in the last 168 hours. No results for input(s): AMMONIA in the last 168 hours.  CBC: Recent Labs  Lab 12/17/20 0507 12/18/20 0411 12/19/20 0826 12/20/20 0328 12/21/20 0440  WBC 13.9* 16.3* 17.3* 15.1* 15.9*  NEUTROABS 11.3* 13.4* 13.1* 10.4* 10.2*  HGB 10.5* 10.6* 12.3 11.7* 9.7*  HCT 31.6* 32.5* 38.2 37.4 30.8*  MCV 96.9 97.6 99.5 100.3* 99.4  PLT 158 157 145* 153 141*    Cardiac Enzymes: No results for input(s): CKTOTAL, CKMB, CKMBINDEX, TROPONINI in the last 168 hours.  BNP: Invalid input(s): POCBNP  CBG: No results for input(s): GLUCAP in the last 168 hours.  Microbiology: Results for orders placed or performed during the hospital encounter of 12/14/20  Blood culture (routine x 2)     Status: None   Collection Time: 12/14/20  2:18 PM   Specimen: Right Antecubital; Blood  Result Value Ref Range Status   Specimen  Description RIGHT ANTECUBITAL  Final   Special Requests   Final    BOTTLES DRAWN AEROBIC AND ANAEROBIC Blood Culture results may not be optimal due to an inadequate volume of blood received in culture bottles   Culture   Final    NO GROWTH 5 DAYS Performed at [facility], [address] [place], [place] [address]    Report Status 12/19/2020 FINAL  Final  Resp Panel by RT-PCR (Flu A&B, Covid) Nasopharyngeal Swab     Status: Abnormal   Collection Time: 12/14/20  7:21 PM   Specimen: Nasopharyngeal Swab; Nasopharyngeal(NP) swabs in vial transport medium  Result Value Ref  Range Status   SARS Coronavirus 2 by RT PCR POSITIVE (A) NEGATIVE Final    Comment: RESULT CALLED TO, READ BACK BY AND VERIFIED WITH: HARRY,A @ 2016 ON 12/14/20 BY JUW (NOTE) SARS-CoV-2 target nucleic acids are DETECTED.  The SARS-CoV-2 RNA is generally detectable in upper respiratory specimens during the acute phase of infection. Positive results are indicative of the presence of the identified virus, but do not rule out bacterial infection or co-infection with other pathogens not detected by the test. Clinical correlation with patient history and other diagnostic information is necessary to determine patient infection status. The expected result is Negative.  Fact Sheet for Patients: EntrepreneurPulse.com.au  Fact Sheet for Healthcare Providers: IncredibleEmployment.be  This test is not yet approved or cleared by the [place] FDA and  has been authorized for detection and/or diagnosis of SARS-CoV-2 by FDA under an Emergency Use Authorization (EUA).  This EUA will remain in effect (meaning this test can be  used) for the duration of  the COVID-19 declaration under Section 564(b)(1) of the Act, 21 U.S.C. section 360bbb-3(b)(1), unless the authorization is terminated or revoked sooner.     Influenza A by PCR NEGATIVE NEGATIVE Final   Influenza B by PCR NEGATIVE NEGATIVE  Final    Comment: (NOTE) The Xpert Xpress SARS-CoV-2/FLU/RSV plus assay is intended as an aid in the diagnosis of influenza from Nasopharyngeal swab specimens and should not be used as a sole basis for treatment. Nasal washings and aspirates are unacceptable for Xpert Xpress SARS-CoV-2/FLU/RSV testing.  Fact Sheet for Patients: EntrepreneurPulse.com.au  Fact Sheet for Healthcare Providers: IncredibleEmployment.be  This test is not yet approved or cleared by the [place] FDA and has been authorized for detection and/or diagnosis of SARS-CoV-2 by FDA under an Emergency Use Authorization (EUA). This EUA will remain in effect (meaning this test can be used) for the duration of the COVID-19 declaration under Section 564(b)(1) of the Act, 21 U.S.C. section 360bbb-3(b)(1), unless the authorization is terminated or revoked.  Performed at [facility], [address] [place], [place] [address]   Blood culture (routine x 2)     Status: None   Collection Time: 12/14/20  9:25 PM   Specimen: Neck; Blood  Result Value Ref Range Status   Specimen Description NECK  Final   Special Requests   Final    BOTTLES DRAWN AEROBIC AND ANAEROBIC Blood Culture adequate volume   Culture   Final    NO GROWTH 5 DAYS Performed at [facility], [address], [place], [place] [address]    Report Status 12/19/2020 FINAL  Final  Aerobic Culture (superficial specimen)     Status: None (Preliminary result)   Collection Time: 12/18/20  8:38 AM   Specimen: Wound  Result Value Ref Range Status   Specimen Description   Final    WOUND Performed at [facility], [address], [place], [place] [address]    Special Requests   Final    LEFT FOOT Performed at [facility], [address] [place], [place] [address]    Gram Stain   Final    RARE WBC PRESENT, PREDOMINANTLY MONONUCLEAR FEW GRAM POSITIVE COCCI RARE YEAST    Culture   Final    MODERATE ENTEROCOCCUS  FAECALIS MODERATE PROTEUS VULGARIS SUSCEPTIBILITIES TO FOLLOW Performed at [facility], [place] [address], [place], [place] [address]    Report Status PENDING  Incomplete   Organism ID, Bacteria ENTEROCOCCUS FAECALIS  Final      Susceptibility  Enterococcus faecalis - MIC*    AMPICILLIN <=2 SENSITIVE Sensitive     VANCOMYCIN 2 SENSITIVE Sensitive     GENTAMICIN SYNERGY SENSITIVE Sensitive     * MODERATE ENTEROCOCCUS FAECALIS    Coagulation Studies: No results for input(s): LABPROT, INR in the last 72 hours.  Urinalysis: No results for input(s): COLORURINE, LABSPEC, PHURINE, GLUCOSEU, HGBUR, BILIRUBINUR, KETONESUR, PROTEINUR, UROBILINOGEN, NITRITE, LEUKOCYTESUR in the last 72 hours.  Invalid input(s): APPERANCEUR    Imaging:  No results found.   Assessment & Plan: Ms. AKILI DICKEN is a 50 y.o. white female with end stage renal disease on hemodialysis, coronary artery disease, hypertension, peripheral vascular disease, hypothyroidism, who was admitted to [facility] on 12/20/2020 for Cellulitis of fourth toe of left foot [L03.032]   Kidney [facility]) MWF [facility] [place] RIJ permcath 101.5kg  1. End Stage Renal Disease: schedule patient for TTS while inpatient due to COVID status.   2. Hypertension: on carvedilol. However patient has a history of requiring midodrine.   3. Anemia of chronic kidney disease: hemoglobin 9.7.  - EPO with HD treatments  4. Secondary Hyperparathyroidism:  - Sevelamer with meals     LOS: 1 Glorianne Proctor 1/26/20223:16 PM

## 2020-12-21 NOTE — Progress Notes (Signed)
Patient ID: Lori [name], female   DOB: 11-Oct-1971, 50 y.o.   MRN: [id number]  PROGRESS NOTE    Lori [name]  [id number] DOB: 01/18/1971 DOA: 12/20/2020 PCP: Patient, No Pcp Per   Brief Narrative:  50 y.o. female with medical history significant for CAD s/p stent to LAD in 2020, ESRD on HD MWF, hypertension, hypothyroidism, hyperparathyroidism, GERD who was initially admitted to [facility] on 12/14/2020 for a left foot wound.  She was started on antibiotics.  General surgery evaluated the patient and opined that she may ultimately need a left AKA.  Patient wanted to discuss with her vascular surgeon Dr. Delana Meyer before any surgery would be considered.  She was transferred to [facility] for further evaluation.  At [facility], she was found to be incidentally Covid positive on 12/14/2020 but remained asymptomatic and was treated with 3 days of IV remdesivir.  Nephrology has been following for continuation of dialysis.  Wound culture from 12/18/2020 grew Enterococcus and Proteus.  Assessment & Plan:   Left foot wound infection with gangrene/chronic ischemia of the left lower extremity -Dry necrotic/ischemic appearance of left fourth and fifth toes -Transferred from [facility] for vascular surgery evaluation:  will follow recommendations -Currently on broad-spectrum antibiotics.  Wound culture grew Enterococcus faecalis and Proteus vulgaris  Leukocytosis -Monitor  End-stage renal disease on hemodialysis -Continue dialysis as per nephrology schedule  Anemia of chronic disease -From renal failure.  Hemoglobin stable.  COVID-19 positive -Incidentally Covid + 12/14/2020.  She has been asymptomatic.  She states she has been fully vaccinated including booster dose.  She received 3 days IV remdesivir.  Currently no need for further directed management.  Remain on airborne precautions.  Paroxysmal atrial fibrillation: -Not on anticoagulation or currently on rate/rhythm controlling agents.     Continue aspirin.  CAD: -Continue aspirin, Coreg and Brilinta. -Not currently on statin  Hypertension: -Monitor blood pressure.  Continue Coreg  Hypothyroidism: -Continue Synthroid   DVT prophylaxis: Heparin Code Status: Full Family Communication: None Disposition Plan: Status is: Inpatient  Remains inpatient appropriate because:Inpatient level of care appropriate due to severity of illness   Dispo: The patient is from: Home              Anticipated d/c is to: Home              Anticipated d/c date is: > 3 days              Patient currently is not medically stable to d/c.   Difficult to place patient No   Consultants: Vascular surgery/nephrology  Procedures: None  Antimicrobials:  Anti-infectives (From admission, onward)   Start     Dose/Rate Route Frequency Ordered Stop   12/21/20 1200  ceFEPIme (MAXIPIME) 2 g in sodium chloride 0.9 % 100 mL IVPB        2 g 200 mL/hr over 30 Minutes Intravenous Every M-W-F (Hemodialysis) 12/20/20 2038     12/21/20 1200  vancomycin (VANCOCIN) IVPB 1000 mg/200 mL premix        1,000 mg 200 mL/hr over 60 Minutes Intravenous Every M-W-F (Hemodialysis) 12/20/20 2038     12/20/20 2200  metroNIDAZOLE (FLAGYL) tablet 500 mg        500 mg Oral Every 8 hours 12/20/20 2038         Subjective: Patient seen and examined at bedside.  Denies any worsening shortness breath, cough, nausea, vomiting or fever.  Objective: Vitals:   12/21/20 0112 12/21/20 0242 12/21/20  0258 12/21/20 1128  BP:  124/74  126/86  Pulse:  (!) 115 89 82  Resp:  18  14  Temp:  98 F (36.7 C)  97.6 F (36.4 C)  TempSrc:    Oral  SpO2:  96%  100%  Weight: 109.2 kg     Height:        Intake/Output Summary (Last 24 hours) at 12/21/2020 1407 Last data filed at 12/21/2020 0112 Gross per 24 hour  Intake -  Output 0 ml  Net 0 ml   Filed Weights   12/20/20 1858 12/21/20 0112  Weight: 102.9 kg 109.2 kg    Examination:  General exam: Appears calm and  comfortable.  Currently on room air. Respiratory system: Bilateral decreased breath sounds at bases with some scattered crackles Cardiovascular system: S1 & S2 heard, Rate controlled Gastrointestinal system: Abdomen is nondistended, soft and nontender. Normal bowel sounds heard. Extremities: No cyanosis, clubbing; trace lower extremity edema present  Central nervous system: Alert and oriented. No focal neurological deficits. Moving extremities Skin: Dry necrotic appearance of left fourth and fifth toes with surrounding erythema but no drainage. Psychiatry: Flat affect    Data Reviewed: I have personally reviewed following labs and imaging studies  CBC: Recent Labs  Lab 12/17/20 0507 12/18/20 0411 12/19/20 0826 12/20/20 0328 12/21/20 0440  WBC 13.9* 16.3* 17.3* 15.1* 15.9*  NEUTROABS 11.3* 13.4* 13.1* 10.4* 10.2*  HGB 10.5* 10.6* 12.3 11.7* 9.7*  HCT 31.6* 32.5* 38.2 37.4 30.8*  MCV 96.9 97.6 99.5 100.3* 99.4  PLT 158 157 145* 153 [id number]*   Basic Metabolic Panel: Recent Labs  Lab 12/16/20 0417 12/17/20 0506 12/18/20 0411 12/19/20 0826 12/20/20 0328 12/21/20 0440  NA 130* 133* 134* 131* 135 135  K 4.9 4.3 4.8 5.3* 4.4 4.6  CL 93* 95* 95* 96* 95* 97*  CO2 18* 22 21* 18* 24 23  GLUCOSE 79 85 82 89 107* 81  BUN 53* 32* 43* 59* 32* 40*  CREATININE 11.27* 7.51* 9.09* 11.56* 7.49* 9.14*  CALCIUM 7.9* 8.4* 8.5* 8.5* 8.5* 8.8*  PHOS 7.4* 5.7* 7.0* 7.5* 4.7*  --    GFR: Estimated Creatinine Clearance: 8.6 mL/min (A) (by C-G formula based on SCr of 9.14 mg/dL (H)). Liver Function Tests: Recent Labs  Lab 12/14/20 1715 12/16/20 0417 12/17/20 0506 12/18/20 0411 12/19/20 0826 12/20/20 0328 12/21/20 0440  AST 65*  --   --   --   --   --  32  ALT 27  --   --   --   --   --  27  ALKPHOS 50  --   --   --   --   --  56  BILITOT 0.6  --   --   --   --   --  0.7  PROT 7.1  --   --   --   --   --  6.2*  ALBUMIN 3.5   < > 3.2* 3.3* 3.2* 3.1* 3.1*   < > = values in this interval  not displayed.   No results for input(s): LIPASE, AMYLASE in the last 168 hours. No results for input(s): AMMONIA in the last 168 hours. Coagulation Profile: No results for input(s): INR, PROTIME in the last 168 hours. Cardiac Enzymes: No results for input(s): CKTOTAL, CKMB, CKMBINDEX, TROPONINI in the last 168 hours. BNP (last 3 results) No results for input(s): PROBNP in the last 8760 hours. HbA1C: No results for input(s): HGBA1C in the last 72 hours. CBG:  No results for input(s): GLUCAP in the last 168 hours. Lipid Profile: No results for input(s): CHOL, HDL, LDLCALC, TRIG, CHOLHDL, LDLDIRECT in the last 72 hours. Thyroid Function Tests: No results for input(s): TSH, T4TOTAL, FREET4, T3FREE, THYROIDAB in the last 72 hours. Anemia Panel: No results for input(s): VITAMINB12, FOLATE, FERRITIN, TIBC, IRON, RETICCTPCT in the last 72 hours. Sepsis Labs: Recent Labs  Lab 12/14/20 1618 12/14/20 1715  LATICACIDVEN 2.0* 1.8    Recent Results (from the past 240 hour(s))  Blood culture (routine x 2)     Status: None   Collection Time: 12/14/20  2:18 PM   Specimen: Right Antecubital; Blood  Result Value Ref Range Status   Specimen Description RIGHT ANTECUBITAL  Final   Special Requests   Final    BOTTLES DRAWN AEROBIC AND ANAEROBIC Blood Culture results may not be optimal due to an inadequate volume of blood received in culture bottles   Culture   Final    NO GROWTH 5 DAYS Performed at [facility], [address], [place], [place] [address]    Report Status 12/19/2020 FINAL  Final  Resp Panel by RT-PCR (Flu A&B, Covid) Nasopharyngeal Swab     Status: Abnormal   Collection Time: 12/14/20  7:21 PM   Specimen: Nasopharyngeal Swab; Nasopharyngeal(NP) swabs in vial transport medium  Result Value Ref Range Status   SARS Coronavirus 2 by RT PCR POSITIVE (A) NEGATIVE Final    Comment: RESULT CALLED TO, READ BACK BY AND VERIFIED WITH: HARRY,A @ 2016 ON 12/14/20 BY JUW (NOTE) SARS-CoV-2  target nucleic acids are DETECTED.  The SARS-CoV-2 RNA is generally detectable in upper respiratory specimens during the acute phase of infection. Positive results are indicative of the presence of the identified virus, but do not rule out bacterial infection or co-infection with other pathogens not detected by the test. Clinical correlation with patient history and other diagnostic information is necessary to determine patient infection status. The expected result is Negative.  Fact Sheet for Patients: EntrepreneurPulse.com.au  Fact Sheet for Healthcare Providers: IncredibleEmployment.be  This test is not yet approved or cleared by the [place] FDA and  has been authorized for detection and/or diagnosis of SARS-CoV-2 by FDA under an Emergency Use Authorization (EUA).  This EUA will remain in effect (meaning this test can be  used) for the duration of  the COVID-19 declaration under Section 564(b)(1) of the Act, 21 U.S.C. section 360bbb-3(b)(1), unless the authorization is terminated or revoked sooner.     Influenza A by PCR NEGATIVE NEGATIVE Final   Influenza B by PCR NEGATIVE NEGATIVE Final    Comment: (NOTE) The Xpert Xpress SARS-CoV-2/FLU/RSV plus assay is intended as an aid in the diagnosis of influenza from Nasopharyngeal swab specimens and should not be used as a sole basis for treatment. Nasal washings and aspirates are unacceptable for Xpert Xpress SARS-CoV-2/FLU/RSV testing.  Fact Sheet for Patients: EntrepreneurPulse.com.au  Fact Sheet for Healthcare Providers: IncredibleEmployment.be  This test is not yet approved or cleared by the [place] FDA and has been authorized for detection and/or diagnosis of SARS-CoV-2 by FDA under an Emergency Use Authorization (EUA). This EUA will remain in effect (meaning this test can be used) for the duration of the COVID-19 declaration under Section  564(b)(1) of the Act, 21 U.S.C. section 360bbb-3(b)(1), unless the authorization is terminated or revoked.  Performed at [facility], [address], [place], [place] [address]   Blood culture (routine x 2)     Status: None  Collection Time: 12/14/20  9:25 PM   Specimen: Neck; Blood  Result Value Ref Range Status   Specimen Description NECK  Final   Special Requests   Final    BOTTLES DRAWN AEROBIC AND ANAEROBIC Blood Culture adequate volume   Culture   Final    NO GROWTH 5 DAYS Performed at [facility], [address], [place], [place] [address]    Report Status 12/19/2020 FINAL  Final  Aerobic Culture (superficial specimen)     Status: None (Preliminary result)   Collection Time: 12/18/20  8:38 AM   Specimen: Wound  Result Value Ref Range Status   Specimen Description   Final    WOUND Performed at [facility], [address] [place], [place] [address]    Special Requests   Final    LEFT FOOT Performed at [facility], [address], [place], [place] [address]    Gram Stain   Final    RARE WBC PRESENT, PREDOMINANTLY MONONUCLEAR FEW GRAM POSITIVE COCCI RARE YEAST    Culture   Final    MODERATE ENTEROCOCCUS FAECALIS MODERATE PROTEUS VULGARIS SUSCEPTIBILITIES TO FOLLOW Performed at [facility], [place] [address], [place], [place] [address]    Report Status PENDING  Incomplete   Organism ID, Bacteria ENTEROCOCCUS FAECALIS  Final      Susceptibility   Enterococcus faecalis - MIC*    AMPICILLIN <=2 SENSITIVE Sensitive     VANCOMYCIN 2 SENSITIVE Sensitive     GENTAMICIN SYNERGY SENSITIVE Sensitive     * MODERATE ENTEROCOCCUS FAECALIS         Radiology Studies: No results found.      Scheduled Meds: . aspirin EC  81 mg Oral Daily  . carvedilol  3.125 mg Oral BID WC  . Chlorhexidine Gluconate Cloth  6 each Topical Daily  . gabapentin  100 mg Oral QHS  . heparin  5,000 Units Subcutaneous Q8H  . levothyroxine  300 mcg Oral QHS  . metroNIDAZOLE  500 mg  Oral Q8H  . pantoprazole  40 mg Oral QHS  . sevelamer carbonate  3,200 mg Oral TID WC  . ticagrelor  90 mg Oral BID   Continuous Infusions: . ceFEPIme (MAXIPIME) 2 GM IVPB (Mini-Bag Plus)    . vancomycin            Aline August, MD [facility] 12/21/2020, 2:07 PM

## 2020-12-21 NOTE — Consult Note (Signed)
[facility] Vascular Consult Note  MRN : [id number]  Lori [name] is a 50 y.o. (1971-03-29) female who presents with chief complaint of transfer from [facility].  History of Present Illness:  Lori [name] is a 50 y.o. female with medical history significant for CAD s/p stent to LAD in 2020, ESRD on HD MWF, hypertension, hypothyroidism, hyperparathyroidism, GERD who was initially admitted to [facility] on 12/14/2020 for a left foot wound.  Patient transferred from independent hospital.  Initially admitted 12/14/2020 for left foot wound.  She was seen by Kandis Cocking general surgery who are concerned she may ultimately need a left AKA.  This was discussed with patient and patient wanted to discuss with her vascular surgeon, Dr. Delana Meyer before any surgery is considered.  The hospitalist at [facility] discussed with patient's vascular surgeon on 12/16/2020 who recommended transfer to [facility] for further evaluation.  That has not been available until today (12/20/2020).  While at [facility], patient incidentally noted to be Covid + 12/14/2020 and was asymptomatic.  She was treated with 3 days IV remdesivir.  She has also been on broad-spectrum IV antibiotic with vancomycin, cefepime, and Flagyl.  Wound culture obtained 12/18/2020 has grown out Enterococcus faecalis and Proteus vulgaris.  Enterococcus faecalis sensitive to ampicillin, gentamicin, vancomycin.  She has continued dialysis on MWF schedule.  Our service is familiar with the patient however have not seen her recently.  Patient was a no-show for her last outpatient appointment on Apr 17, 2018.  The last time we intervened on her left lower extremity via angiogram was on August 19, 2013.  When I tried to discuss the patient's care at [facility] the patient notes that she "was not told anything by doctors".  Vascular surgery was consulted by Dr. Posey Pronto for possible intervention.  Current Facility-Administered  Medications  Medication Dose Route Frequency Provider Last Rate Last Admin  . acetaminophen (TYLENOL) tablet 650 mg  650 mg Oral Q6H PRN Patel, Vishal R, MD      . albuterol (VENTOLIN HFA) 108 (90 Base) MCG/ACT inhaler 1-2 puff  1-2 puff Inhalation Q6H PRN Zada Finders R, MD      . aspirin EC tablet 81 mg  81 mg Oral Daily Zada Finders R, MD   81 mg at 12/21/20 1118  . carvedilol (COREG) tablet 3.125 mg  3.125 mg Oral BID WC Zada Finders R, MD   3.125 mg at 12/21/20 1118  . ceFEPIme (MAXIPIME) 2 g in sodium chloride 0.9 % 100 mL IVPB  2 g Intravenous Q M,W,F-HD Zada Finders R, MD      . Chlorhexidine Gluconate Cloth 2 % PADS 6 each  6 each Topical Daily Aline August, MD   6 each at 12/21/20 1130  . gabapentin (NEURONTIN) capsule 100 mg  100 mg Oral QHS Zada Finders R, MD   100 mg at 12/20/20 2133  . heparin injection 5,000 Units  5,000 Units Subcutaneous Q8H Lenore Cordia, MD   5,000 Units at 12/21/20 0528  . HYDROcodone-acetaminophen (NORCO/VICODIN) 5-325 MG per tablet 1 tablet  1 tablet Oral Q6H PRN Lenore Cordia, MD   1 tablet at 12/20/20 2133  . levothyroxine (SYNTHROID) tablet 300 mcg  300 mcg Oral QHS Patel, Vishal R, MD      . metroNIDAZOLE (FLAGYL) tablet 500 mg  500 mg Oral Q8H Zada Finders R, MD   500 mg at 12/21/20 0528  . morphine 2 MG/ML injection 2 mg  2 mg Intravenous Q2H PRN Athena Masse, MD   2 mg at 12/21/20 1117  . ondansetron (ZOFRAN) tablet 4 mg  4 mg Oral Q6H PRN Lenore Cordia, MD       Or  . ondansetron (ZOFRAN) injection 4 mg  4 mg Intravenous Q6H PRN Zada Finders R, MD      . pantoprazole (PROTONIX) EC tablet 40 mg  40 mg Oral QHS Lenore Cordia, MD   40 mg at 12/20/20 2133  . senna-docusate (Senokot-S) tablet 1 tablet  1 tablet Oral QHS PRN Zada Finders R, MD      . sevelamer carbonate (RENVELA) tablet 1,600 mg  1,600 mg Oral PRN Zada Finders R, MD      . sevelamer carbonate (RENVELA) tablet 3,200 mg  3,200 mg Oral TID WC Patel, Vishal R, MD      .  ticagrelor (BRILINTA) tablet 90 mg  90 mg Oral BID Lenore Cordia, MD   90 mg at 12/21/20 1117  . vancomycin (VANCOCIN) IVPB 1000 mg/200 mL premix  1,000 mg Intravenous Q M,W,F-HD Lenore Cordia, MD       Past Medical History:  Diagnosis Date  . Anemia   . ASCVD (arteriosclerotic cardiovascular disease)    a. s/p prior stenting of LAD b. NSTEMI in 01/2018 requiring DES x3 to RCA given spiral dissection from ostium to mid-RCA but residual disease along distal RCA, LAD and 2nd Mrg  . Calciphylaxis   . Chronic abdominal wound infection   . Dialysis patient ([place])   . ESRD (end stage renal disease) ([place])    Due to membranous GN dialysis 09/1996; peritoneal dialysis --? peitonitis; difficult vascular access  . GERD (gastroesophageal reflux disease)   . Hashimoto thyroiditis   . Hyperparathyroidism   . Hypertension   . Hypothyroidism   . Medically noncompliant   . Morbid obesity ([place])    Past Surgical History:  Procedure Laterality Date  . [place]  . CORONARY STENT INTERVENTION N/A 10/21/2019   Procedure: CORONARY STENT INTERVENTION;  Surgeon: Wellington Hampshire, MD;  Location: [place] [facility];  Service: Cardiovascular;  Laterality: N/A;  . LEFT HEART CATH AND CORONARY ANGIOGRAPHY N/A 02/11/2018   Procedure: LEFT HEART CATH AND CORONARY ANGIOGRAPHY;  Surgeon: Charolette Forward, MD;  Location: [place] [facility];  Service: Cardiovascular;  Laterality: N/A;  . LEFT HEART CATH AND CORONARY ANGIOGRAPHY N/A 10/14/2019   Procedure: LEFT HEART CATH AND CORONARY ANGIOGRAPHY;  Surgeon: Wellington Hampshire, MD;  Location: [place] [facility];  Service: Cardiovascular;  Laterality: N/A;  . THYROIDECTOMY, PARTIAL     Resectin of left lobe with reimplantation in the forearm, small focus of papillary carcinoma incidentally noted at pathology - 2000 and Hashimoto's thyrdoiditis in 2001  . TONSILLECTOMY AND ADENOIDECTOMY     Social History Social History   Tobacco Use  . Smoking status: Former Research scientist (life sciences)   . Smokeless tobacco: Never Used  Vaping Use  . Vaping Use: Never used  Substance Use Topics  . Alcohol use: No  . Drug use: No   Family History Family History  Problem Relation Age of Onset  . Coronary artery disease Mother   . Kidney disease Father   . Diabetes Sister   Positive for kidney disease paternal.  Denies any peripheral artery disease venous disease.  Allergies  Allergen Reactions  . Activase [Alteplase] Shortness Of Breath  . Bee Pollen Anaphylaxis  . Warfarin Sodium Nausea And Vomiting and Rash  REVIEW OF SYSTEMS (Negative unless checked)  Constitutional: '[]'$ Weight loss  '[]'$ Fever  '[]'$ Chills Cardiac: '[]'$ Chest pain   '[]'$ Chest pressure   '[]'$ Palpitations   '[]'$ Shortness of breath when laying flat   '[]'$ Shortness of breath at rest   '[]'$ Shortness of breath with exertion. Vascular:  '[]'$ Pain in legs with walking   '[]'$ Pain in legs at rest   '[]'$ Pain in legs when laying flat   '[]'$ Claudication   '[]'$ Pain in feet when walking  '[]'$ Pain in feet at rest  '[]'$ Pain in feet when laying flat   '[]'$ History of DVT   '[]'$ Phlebitis   '[]'$ Swelling in legs   '[]'$ Varicose veins   '[x]'$ Non-healing ulcers Pulmonary:   '[]'$ Uses home oxygen   '[]'$ Productive cough   '[]'$ Hemoptysis   '[]'$ Wheeze  '[]'$ COPD   '[]'$ Asthma Neurologic:  '[]'$ Dizziness  '[]'$ Blackouts   '[]'$ Seizures   '[]'$ History of stroke   '[]'$ History of TIA  '[]'$ Aphasia   '[]'$ Temporary blindness   '[]'$ Dysphagia   '[]'$ Weakness or numbness in arms   '[]'$ Weakness or numbness in legs Musculoskeletal:  '[]'$ Arthritis   '[]'$ Joint swelling   '[]'$ Joint pain   '[]'$ Low back pain Hematologic:  '[]'$ Easy bruising  '[]'$ Easy bleeding   '[]'$ Hypercoagulable state   '[]'$ Anemic  '[]'$ Hepatitis Gastrointestinal:  '[]'$ Blood in stool   '[]'$ Vomiting blood  '[]'$ Gastroesophageal reflux/heartburn   '[]'$ Difficulty swallowing. Genitourinary:  '[x]'$ Chronic kidney disease   '[]'$ Difficult urination  '[]'$ Frequent urination  '[]'$ Burning with urination   '[]'$ Blood in urine Skin:  '[]'$ Rashes   '[x]'$ Ulcers   '[x]'$ Wounds Psychological:  '[]'$ History of anxiety   '[]'$  History of major  depression.  Physical Examination  Vitals:   12/21/20 0112 12/21/20 0242 12/21/20 0258 12/21/20 1128  BP:  124/74  126/86  Pulse:  (!) 115 89 82  Resp:  18  14  Temp:  98 F (36.7 C)  97.6 F (36.4 C)  TempSrc:    Oral  SpO2:  96%  100%  Weight: 109.2 kg     Height:       Body mass index is 42.66 kg/m. Gen:  WD/WN, NAD Head: [place]/AT, No temporalis wasting. Prominent temp pulse not noted. Ear/Nose/Throat: Hearing grossly intact, nares w/o erythema or drainage, oropharynx w/o Erythema/Exudate Eyes: Sclera non-icteric, conjunctiva clear Neck: Trachea midline.  No JVD.  Pulmonary:  Good air movement, respirations not labored, equal bilaterally.  Cardiac: RRR, normal S1, S2. Vascular:  Vessel Right Left  Radial Palpable Palpable  Ulnar Palpable Palpable  Brachial Palpable Palpable  Carotid Palpable, without bruit Palpable, without bruit  Aorta Not palpable N/A  Femoral Palpable Palpable  Popliteal Palpable Palpable  PT Palpable Non-Palpable  DP Palpable Non-Palpable   Gastrointestinal: soft, non-tender/non-distended. No guarding/reflex.  Musculoskeletal: M/S 5/5 throughout.  Extremities without ischemic changes.  No deformity or atrophy. No edema. Neurologic: Sensation grossly intact in extremities.  Symmetrical.  Speech is fluent. Motor exam as listed above. Psychiatric: Judgment intact, Mood & affect appropriate for pt's clinical situation. Dermatologic: Necrosis to left foot  Lymph : No Cervical, Axillary, or Inguinal lymphadenopathy.  CBC Lab Results  Component Value Date   WBC 15.9 (H) 12/21/2020   HGB 9.7 (L) 12/21/2020   HCT 30.8 (L) 12/21/2020   MCV 99.4 12/21/2020   PLT 141 (L) 12/21/2020   BMET    Component Value Date/Time   NA 135 12/21/2020 0440   NA 138 03/26/2014 1425   K 4.6 12/21/2020 0440   K 3.5 03/26/2014 1425   CL 97 (L) 12/21/2020 0440   CL 99 03/26/2014 1425   CO2 23 12/21/2020 0440  CO2 26 03/26/2014 1425   GLUCOSE 81 12/21/2020 0440    GLUCOSE 79 03/26/2014 1425   BUN 40 (H) 12/21/2020 0440   BUN 36 (H) 03/26/2014 1425   CREATININE 9.14 (H) 12/21/2020 0440   CREATININE 9.54 (H) 03/26/2014 1425   CALCIUM 8.8 (L) 12/21/2020 0440   CALCIUM 7.7 (L) 03/26/2014 1425   GFRNONAA 5 (L) 12/21/2020 0440   GFRNONAA 4 (L) 03/26/2014 1425   GFRAA 7 (L) 10/22/2019 0240   GFRAA 5 (L) 03/26/2014 1425   Estimated Creatinine Clearance: 8.6 mL/min (A) (by C-G formula based on SCr of 9.14 mg/dL (H)).  COAG Lab Results  Component Value Date   INR 1.13 02/09/2018   INR 1.25 03/27/2014   INR 1.1 03/26/2014   Radiology CT ABDOMEN PELVIS WO CONTRAST  Result Date: 12/14/2020 CLINICAL DATA:  Generalized abdominal pain following fall, initial encounter EXAM: CT ABDOMEN AND PELVIS WITHOUT CONTRAST TECHNIQUE: Multidetector CT imaging of the abdomen and pelvis was performed following the standard protocol without IV contrast. COMPARISON:  05/03/2013 FINDINGS: Lower chest: No acute abnormality. Heavy coronary calcifications are noted. Hepatobiliary: Tiny dependent gallstones are noted. The gallbladder is otherwise within normal limits. Liver is unremarkable. Pancreas: Unremarkable. No pancreatic ductal dilatation or surrounding inflammatory changes. Spleen: Normal in size without focal abnormality. Adrenals/Urinary Tract: Adrenal glands are within normal limits. Kidneys demonstrate significant cortical thinning with multiple cysts the largest of which lies in the lower pole of the right kidney measuring 5.8 cm in greatest dimension. This has increased in size from the prior exam at which time it measured 4.6 cm. No obstructive changes are seen. No definitive renal calculi are noted. The bladder is decompressed. These changes are consistent with the patient's known history of end-stage renal failure. Stomach/Bowel: Colon is well visualized without obstructive or inflammatory changes. The appendix is within normal limits. No small bowel or gastric  abnormality is noted. Vascular/Lymphatic: Aortic atherosclerosis. No enlarged abdominal or pelvic lymph nodes. Left retroaortic renal vein is noted. Multiple collaterals are noted in the anterior abdominal wall likely related to central venous stenosis. These are stable from the prior exam. Calcified graft in the left thigh proximally is noted. Reproductive: Uterus and bilateral adnexa are unremarkable. Other: No abdominal wall hernia or abnormality. No abdominopelvic ascites. Musculoskeletal: No acute or significant osseous findings. IMPRESSION: Cholelithiasis without complicating factors. Changes consistent with end-stage renal failure with multiple renal cysts bilaterally and diffuse cortical thinning. Aortic atherosclerotic changes. Multiple venous collaterals in the anterior abdominal wall consistent with central venous stenosis likely related to the known dialysis. No acute abnormality related to the recent injury is noted. Electronically Signed   By: Inez Catalina M.D.   On: 12/14/2020 17:10   DG Chest 2 View  Result Date: 12/14/2020 CLINICAL DATA:  Pain following fall EXAM: CHEST - 2 VIEW COMPARISON:  October 12, 2019 chest radiograph and chest CT October 16, 2019 FINDINGS: Central catheter tip is in the superior vena cava. No pneumothorax. There is no edema or airspace opacity. Heart is mildly enlarged with pulmonary vascularity normal. No adenopathy. No bone lesions. Postoperative change in the thyroid region noted. IMPRESSION: Central catheter as described without pneumothorax. Lungs clear. Heart mildly enlarged. Electronically Signed   By: Lowella Grip III M.D.   On: 12/14/2020 15:16   CT Head Wo Contrast  Result Date: 12/14/2020 CLINICAL DATA:  Fall.  Hit head. EXAM: CT HEAD WITHOUT CONTRAST CT CERVICAL SPINE WITHOUT CONTRAST TECHNIQUE: Multidetector CT imaging of the head and cervical spine  was performed following the standard protocol without intravenous contrast. Multiplanar CT image  reconstructions of the cervical spine were also generated. COMPARISON:  CT head dated June 15, 2010. FINDINGS: CT HEAD FINDINGS Brain: No evidence of acute infarction, hemorrhage, hydrocephalus, extra-axial collection or mass lesion/mass effect. Tiny old lacunar infarct in the right basal ganglia. Vascular: Calcified atherosclerosis at the skullbase. No hyperdense vessel. Skull: Thickened skull with diffuse sclerosis and patchy sclerotic foci, consistent with renal osteodystrophy. No fracture. Sinuses/Orbits: No acute finding. Other: None. CT CERVICAL SPINE FINDINGS Alignment: Mild reversal of the normal cervical lordosis. No traumatic malalignment. Skull base and vertebrae: No acute fracture. No primary bone lesion or focal pathologic process. Soft tissues and spinal canal: No prevertebral fluid or swelling. No visible canal hematoma. Disc levels: Mild disc height loss at C6-C7. Mild right uncovertebral hypertrophy at C2-C3 and C3-C4. Upper chest: Negative. Other: Prior thyroidectomy. IMPRESSION: 1. No acute intracranial abnormality. Old right basal ganglia lacunar infarct. 2. No acute cervical spine fracture or traumatic listhesis. 3. Renal osteodystrophy. Electronically Signed   By: Titus Dubin M.D.   On: 12/14/2020 15:15   CT Cervical Spine Wo Contrast  Result Date: 12/14/2020 CLINICAL DATA:  Fall.  Hit head. EXAM: CT HEAD WITHOUT CONTRAST CT CERVICAL SPINE WITHOUT CONTRAST TECHNIQUE: Multidetector CT imaging of the head and cervical spine was performed following the standard protocol without intravenous contrast. Multiplanar CT image reconstructions of the cervical spine were also generated. COMPARISON:  CT head dated June 15, 2010. FINDINGS: CT HEAD FINDINGS Brain: No evidence of acute infarction, hemorrhage, hydrocephalus, extra-axial collection or mass lesion/mass effect. Tiny old lacunar infarct in the right basal ganglia. Vascular: Calcified atherosclerosis at the skullbase. No hyperdense vessel.  Skull: Thickened skull with diffuse sclerosis and patchy sclerotic foci, consistent with renal osteodystrophy. No fracture. Sinuses/Orbits: No acute finding. Other: None. CT CERVICAL SPINE FINDINGS Alignment: Mild reversal of the normal cervical lordosis. No traumatic malalignment. Skull base and vertebrae: No acute fracture. No primary bone lesion or focal pathologic process. Soft tissues and spinal canal: No prevertebral fluid or swelling. No visible canal hematoma. Disc levels: Mild disc height loss at C6-C7. Mild right uncovertebral hypertrophy at C2-C3 and C3-C4. Upper chest: Negative. Other: Prior thyroidectomy. IMPRESSION: 1. No acute intracranial abnormality. Old right basal ganglia lacunar infarct. 2. No acute cervical spine fracture or traumatic listhesis. 3. Renal osteodystrophy. Electronically Signed   By: Titus Dubin M.D.   On: 12/14/2020 15:15   US ARTERIAL ABI (SCREENING LOWER EXTREMITY)  Result Date: 12/15/2020 CLINICAL DATA:  50 year old female with a history cellulitis of the fourth EXAM: NONINVASIVE PHYSIOLOGIC VASCULAR STUDY OF BILATERAL LOWER EXTREMITIES TECHNIQUE: Evaluation of both lower extremities was performed at rest, including calculation of ankle-brachial indices, multiple segmental pressure evaluation, segmental Doppler and segmental pulse volume recording. COMPARISON:  None. FINDINGS: Right ABI:  Not acquired Left ABI:  Not acquired Right Lower Extremity: Segmental Doppler at the right ankle demonstrates no signal of the posterior tibial artery and monophasic dorsalis pedis. Left Lower Extremity: No signal identified within the posterior tibial artery or dorsalis pedis on the left. IMPRESSION: Resting ABI the bilateral lower extremities was not performed. Monophasic waveform of the dorsalis pedis on the right with no signal of the posterior tibial artery. On the left no signal was identified within the tibial arteries at the ankle. Signed, Dulcy Fanny. Dellia Nims, RPVI Vascular and  Interventional Radiology Specialists [facility] Electronically Signed   By: Corrie Mckusick D.O.   On: 12/15/2020 11:35  US ARTERIAL LOWER EXTREMITY DUPLEX LEFT (NON-ABI)  Result Date: 12/15/2020 CLINICAL DATA:  50 year old female with a left foot wound EXAM: NONINVASIVE PHYSIOLOGIC VASCULAR STUDY OF UNILATERAL LOWER EXTREMITIES TECHNIQUE: Evaluation of left lower extremities was performed at rest, including directed duplex. COMPARISON:  None. FINDINGS: Left ABI: Not acquired Left Lower Extremity: Directed duplex left lower extremity demonstrates monophasic waveform of the common femoral artery, superficial femoral artery, popliteal artery. Monophasic anterior tibial artery proximally. No signal distally. No signal of the distal peroneal artery or posterior tibial artery. IMPRESSION: Directed duplex demonstrates monophasic waveform of the common femoral artery, profunda femoris, SFA, popliteal artery. There is no signal identified within the distal anterior tibial artery, posterior tibial artery, peroneal artery. Signed, Dulcy Fanny. Dellia Nims, RPVI Vascular and Interventional Radiology Specialists [facility] Radiology Electronically Signed   By: Corrie Mckusick D.O.   On: 12/15/2020 15:59   DG Foot Complete Left  Result Date: 12/14/2020 CLINICAL DATA:  Wound on foot near fourth and fifth MTP EXAM: LEFT FOOT - COMPLETE 3+ VIEW COMPARISON:  None. FINDINGS: No fracture or malalignment. Vascular calcifications. No soft tissue emphysema or radiopaque foreign body. IMPRESSION: No acute osseous abnormality. Electronically Signed   By: Donavan Foil M.D.   On: 12/14/2020 15:18   ECHOCARDIOGRAM COMPLETE  Result Date: 12/16/2020    ECHOCARDIOGRAM REPORT   Patient Name:   Lori [name] Date of Exam: 12/16/2020 Medical Rec #:  [id number]    Height:       63.0 in Accession #:    [id number]   Weight:       242.3 lb Date of Birth:  [birth year]    BSA:          2.098 m Patient Age:    1 years     BP:           129/68  mmHg Patient Gender: F            HR:           74 bpm. Exam Location:  [name] Procedure: 2D Echo Indications:    Abnormal ECG R94.31  History:        Patient has prior history of Echocardiogram examinations, most                 recent 10/13/2019. TIA, Arrythmias:Atrial Fibrillation,                 Signs/Symptoms:Syncope and Chest Pain; Risk Factors:Dyslipidemia                 and Former Smoker. ESRD.  Sonographer:    Leavy Cella RDCS (AE) Referring Phys: [place]  1. Left ventricular ejection fraction, by estimation, is 55 to 60%. The left ventricle has normal function. Left ventricular endocardial border not optimally defined to evaluate regional wall motion. There is moderate left ventricular hypertrophy. Left ventricular diastolic parameters are consistent with Grade II diastolic dysfunction (pseudonormalization).  2. Right ventricular systolic function is normal. The right ventricular size is normal. Tricuspid regurgitation signal is inadequate for assessing PA pressure.  3. Left atrial size was severely dilated.  4. The mitral valve is abnormal. Trivial mitral valve regurgitation. Moderate to severe mitral annular calcification.  5. The aortic valve is tricuspid. There is moderate calcification of the aortic valve. Aortic valve regurgitation is not visualized. Mild to moderate aortic valve sclerosis/calcification is present, without any evidence of aortic stenosis. Aortic valve mean gradient measures 8.3 mmHg. Aortic valve Vmax measures  2.02 m/s.  6. The inferior vena cava is normal in size with greater than 50% respiratory variability, suggesting right atrial pressure of 3 mmHg. FINDINGS  Left Ventricle: Left ventricular ejection fraction, by estimation, is 55 to 60%. The left ventricle has normal function. Left ventricular endocardial border not optimally defined to evaluate regional wall motion. The left ventricular internal cavity size was small. There is moderate left  ventricular hypertrophy. Left ventricular diastolic parameters are consistent with Grade II diastolic dysfunction (pseudonormalization). Right Ventricle: The right ventricular size is normal. No increase in right ventricular wall thickness. Right ventricular systolic function is normal. Tricuspid regurgitation signal is inadequate for assessing PA pressure. Left Atrium: Left atrial size was severely dilated. Right Atrium: Right atrial size was normal in size. Pericardium: There is no evidence of pericardial effusion. Presence of pericardial fat pad. Mitral Valve: The mitral valve is abnormal. There is mild thickening of the mitral valve leaflet(s). There is mild calcification of the mitral valve leaflet(s). Moderate to severe mitral annular calcification. Trivial mitral valve regurgitation. Tricuspid Valve: The tricuspid valve is grossly normal. Tricuspid valve regurgitation is trivial. Aortic Valve: The aortic valve is tricuspid. There is moderate calcification of the aortic valve. There is moderate aortic valve annular calcification. Aortic valve regurgitation is not visualized. Mild to moderate aortic valve sclerosis/calcification is  present, without any evidence of aortic stenosis. Aortic valve mean gradient measures 8.3 mmHg. Aortic valve peak gradient measures 16.3 mmHg. Aortic valve area, by VTI measures 2.51 cm. Pulmonic Valve: The pulmonic valve was not well visualized. Pulmonic valve regurgitation is not visualized. Aorta: The aortic root is normal in size and structure. Venous: The inferior vena cava is normal in size with greater than 50% respiratory variability, suggesting right atrial pressure of 3 mmHg. IAS/Shunts: No atrial level shunt detected by color flow Doppler.  LEFT VENTRICLE PLAX 2D LVIDd:         2.61 cm  Diastology LVIDs:         1.80 cm  LV e' medial:    4.20 cm/s LV PW:         1.59 cm  LV E/e' medial:  29.0 LV IVS:        1.52 cm  LV e' lateral:   3.00 cm/s LVOT diam:     2.10 cm  LV  E/e' lateral: 40.7 LV SV:         103 LV SV Index:   49 LVOT Area:     3.46 cm  RIGHT VENTRICLE RV S prime:     9.49 cm/s TAPSE (M-mode): 1.7 cm LEFT ATRIUM              Index       RIGHT ATRIUM           Index LA diam:        3.60 cm  1.72 cm/m  RA Area:     10.80 cm LA Vol (A2C):   44.6 ml  21.26 ml/m RA Volume:   21.80 ml  10.39 ml/m LA Vol (A4C):   108.0 ml 51.48 ml/m LA Biplane Vol: 73.1 ml  34.84 ml/m  AORTIC VALVE AV Area (Vmax):    2.31 cm AV Area (Vmean):   2.63 cm AV Area (VTI):     2.51 cm AV Vmax:           201.67 cm/s AV Vmean:          130.333 cm/s AV VTI:  0.410 m AV Peak Grad:      16.3 mmHg AV Mean Grad:      8.3 mmHg LVOT Vmax:         134.33 cm/s LVOT Vmean:        99.033 cm/s LVOT VTI:          0.297 m LVOT/AV VTI ratio: 0.72  AORTA Ao Root diam: 2.90 cm MITRAL VALVE MV Area (PHT): 2.01 cm     SHUNTS MV Decel Time: 378 msec     Systemic VTI:  0.30 m MV E velocity: 122.00 cm/s  Systemic Diam: 2.10 cm MV A velocity: 49.60 cm/s MV E/A ratio:  2.46 Rozann Lesches MD Electronically signed by Rozann Lesches MD Signature Date/Time: 12/16/2020/11:20:42 AM    Final    DG Hips Bilat W or Wo Pelvis 3-4 Views  Result Date: 12/14/2020 CLINICAL DATA:  Fall, initial encounter. EXAM: DG HIP (WITH OR WITHOUT PELVIS) 3-4V BILAT COMPARISON:  None. FINDINGS: Image quality is degraded by body habitus. No acute osseous or joint abnormality. Vascular stent is seen in the left femoral vasculature. IMPRESSION: Image quality is degraded by body habitus. No definite acute osseous or joint abnormality. Electronically Signed   By: Lorin Picket M.D.   On: 12/14/2020 15:17   Assessment/Plan JAKYRIA RADDER is a 50 year old female with medical history significant for CAD s/p stent to LAD in 2020, ESRD on HD MWF, hypertension, hypothyroidism, hyperparathyroidism, GERD who was initially admitted to [facility] on 12/14/2020 for a left foot wound.  1.  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 During conversation with Dr. Delana Meyer on Friday, [facility] staff had reached out for transfer - during the conversation Dr. Delana Meyer mentioned he would be happy to take care of the patient however did not feel that [name] would not be able to provide care for her especially if she was refusing treatment. However, patient still transferred.  Our service is familiar with the patient however have not seen her recently.  Patient was a no-show for her last outpatient appointment on Apr 17, 2018.  The last time we intervened on her left lower extremity via angiogram was on August 19, 2013.  In the setting of gangrene to the left toes can offer the patient an angiogram and attempt assess her anatomy and contributing degree of peripheral artery disease.  Procedure, risks and benefits were explained to the patient.  All questions were answered.  Patient wishes to proceed.  We will plan on this tomorrow.  2.  COVID-19: Currently on precautions  3.  End-stage renal disease: Have not seen the patient since 2019 in regard to her dialysis access Seems to be functioning appropriately Followed by nephrology  Discussed with Dr. Mayme Genta, PA-C  12/21/2020 1:37 PM  This note was created with Dragon medical transcription system.  Any error is purely unintentional.

## 2020-12-21 NOTE — Consult Note (Signed)
[facility] Nurse Consult Note: Reason for Consult:Chronic ischemia of left lower foot.  Pending consult with her vascular surgeon, Dr Delana Meyer.  He is aware.  Previously refused recommended surgical intervention.  She is on antibiotic coverage for infection to this limb.  Dry dressing to necrosis left toes.   Trauma wound to left elbow from fall prior to admission at [facility].   Bleeding noted to abdomen from SQ injection of heparin given days ago.  Will keep this area covered and apply small piece of silver to stop bleeding.   Wound type:trauma Pressure Injury POA: NA Measurement: Left foot with necrotic toes LEft elbow:  2 cm x 2 cm x 0.2 cm scabbed abrasion.  Bleeding at injection site to abdomen Wound bed: scabbed Drainage (amount, consistency, odor) minimal serosanguinous   Periwound:intact Dressing procedure/placement/frequency:Dry dressing to left foot/toe.  Awaiting vascular/surgical consult Cleanse left elbow and bleeding abdominal site with NS.  Apply aquacel Ag piece to wound bed. Cover with dry dressing.  Change Mon/Wed/Fri.    Will not follow at this time.  Please re-consult if needed.  Domenic Moras MSN, RN, FNP-BC CWON Wound, Ostomy, Continence Nurse Pager [phone]

## 2020-12-21 NOTE — Progress Notes (Signed)
Pharmacy Antibiotic Note  Lori [name] is a 50 y.o. female admitted on 12/20/2020 with wound infection.  Pharmacy has been consulted for Vancomycin and Cefepime dosing.  Patient transferred to [facility] 1/25 for vascular evaluation.  General surgery at [facility] felt patient may need AKA.    Today, 12/21/2020  Day 7 antibiotics  Renal - ESRD on HD MWF  WBC = 15.9 (unchanged from yday, down from admission)  1/23 wound culture: ampicillin susc E. Faecalis, P vulgaris (susc pending)   Plan: Continue Vancomycin 1000 mg IV every MWF w/ HD. - Hold of on checking pre-HD level as anticipate ability to de-escalate once culture have proteus susceptibilties.   Continue Cefepime 2000 mg IV every MWF w/ HD. Monitor labs, c/s, and vanco level as indicated.  Height: '5\' 3"'$  (160 cm) Weight: 109.2 kg (240 lb 12.8 oz) IBW/kg (Calculated) : 52.4  Temp (24hrs), Avg:98.1 F (36.7 C), Min:97.7 F (36.5 C), Max:98.5 F (36.9 C)  Recent Labs  Lab 12/14/20 1618 12/14/20 1715 12/15/20 0613 12/17/20 0506 12/17/20 0507 12/18/20 0411 12/19/20 0826 12/20/20 0328 12/21/20 0440  WBC  --  20.6*   < >  --  13.9* 16.3* 17.3* 15.1* 15.9*  CREATININE  --  9.68*   < > 7.51*  --  9.09* 11.56* 7.49* 9.14*  LATICACIDVEN 2.0* 1.8  --   --   --   --   --   --   --    < > = values in this interval not displayed.    Estimated Creatinine Clearance: 8.6 mL/min (A) (by C-G formula based on SCr of 9.14 mg/dL (H)).    Allergies  Allergen Reactions  . Activase [Alteplase] Shortness Of Breath  . Bee Pollen Anaphylaxis  . Warfarin Sodium Nausea And Vomiting and Rash    Vanco 1/19>> Cefepime 1/19>> Flagyl 1/19>> Remdesivir 1/19>>3 total doses for incidental finding   1/19 Bcx: ng 1/23 Wound Cx: E. Faecalis (amp-susc), P. Vulgaris (susc pending)  COVID +  Thank you for allowing pharmacy to be a part of this patient's care.  Doreene Eland, PharmD, BCPS.   Work Cell: [phone] 12/21/2020 10:07 AM

## 2020-12-22 ENCOUNTER — Encounter: Payer: Self-pay | Admitting: Internal Medicine

## 2020-12-22 ENCOUNTER — Inpatient Hospital Stay: Payer: Medicare Other | Admitting: Anesthesiology

## 2020-12-22 ENCOUNTER — Encounter
Admission: AD | Disposition: A | Discharge status: Skilled Nursing Facility | Payer: Self-pay | Source: Other Acute Inpatient Hospital | Attending: Internal Medicine

## 2020-12-22 DIAGNOSIS — I70262 Atherosclerosis of native arteries of extremities with gangrene, left leg: Secondary | ICD-10-CM

## 2020-12-22 DIAGNOSIS — E039 Hypothyroidism, unspecified: Secondary | ICD-10-CM

## 2020-12-22 HISTORY — PX: LOWER EXTREMITY ANGIOGRAPHY: CATH118251

## 2020-12-22 LAB — AEROBIC CULTURE W GRAM STAIN (SUPERFICIAL SPECIMEN)

## 2020-12-22 LAB — CBC WITH DIFFERENTIAL/PLATELET
Abs Immature Granulocytes: 1.87 10*3/uL — ABNORMAL HIGH (ref 0.00–0.07)
Basophils Absolute: 0.2 10*3/uL — ABNORMAL HIGH (ref 0.0–0.1)
Basophils Relative: 1 %
Eosinophils Absolute: 0.4 10*3/uL (ref 0.0–0.5)
Eosinophils Relative: 3 %
HCT: 31.1 % — ABNORMAL LOW (ref 36.0–46.0)
Hemoglobin: 10.1 g/dL — ABNORMAL LOW (ref 12.0–15.0)
Immature Granulocytes: 13 %
Lymphocytes Relative: 11 %
Lymphs Abs: 1.6 10*3/uL (ref 0.7–4.0)
MCH: 31.9 pg (ref 26.0–34.0)
MCHC: 32.5 g/dL (ref 30.0–36.0)
MCV: 98.1 fL (ref 80.0–100.0)
Monocytes Absolute: 1.2 10*3/uL — ABNORMAL HIGH (ref 0.1–1.0)
Monocytes Relative: 9 %
Neutro Abs: 8.8 10*3/uL — ABNORMAL HIGH (ref 1.7–7.7)
Neutrophils Relative %: 63 %
Platelets: 147 10*3/uL — ABNORMAL LOW (ref 150–400)
RBC: 3.17 MIL/uL — ABNORMAL LOW (ref 3.87–5.11)
RDW: 16.4 % — ABNORMAL HIGH (ref 11.5–15.5)
Smear Review: NORMAL
WBC: 14 10*3/uL — ABNORMAL HIGH (ref 4.0–10.5)
nRBC: 0 % (ref 0.0–0.2)

## 2020-12-22 LAB — PHOSPHORUS: Phosphorus: 6 mg/dL — ABNORMAL HIGH (ref 2.5–4.6)

## 2020-12-22 LAB — BASIC METABOLIC PANEL
Anion gap: 15 (ref 5–15)
BUN: 50 mg/dL — ABNORMAL HIGH (ref 6–20)
CO2: 23 mmol/L (ref 22–32)
Calcium: 8.9 mg/dL (ref 8.9–10.3)
Chloride: 97 mmol/L — ABNORMAL LOW (ref 98–111)
Creatinine, Ser: 10.68 mg/dL — ABNORMAL HIGH (ref 0.44–1.00)
GFR, Estimated: 4 mL/min — ABNORMAL LOW (ref >60)
Glucose, Bld: 75 mg/dL (ref 70–99)
Potassium: 5.3 mmol/L — ABNORMAL HIGH (ref 3.5–5.1)
Sodium: 135 mmol/L (ref 135–145)

## 2020-12-22 SURGERY — LOWER EXTREMITY ANGIOGRAPHY
Anesthesia: General | Laterality: Left

## 2020-12-22 MED ORDER — MIDAZOLAM HCL 2 MG/ML PO SYRP: 8.0000 mg | ORAL_SOLUTION | Freq: Once | ORAL | Status: DC | PRN

## 2020-12-22 MED ORDER — LIDOCAINE HCL (CARDIAC) PF 100 MG/5ML IV SOSY
PREFILLED_SYRINGE | INTRAVENOUS | Status: DC | PRN
  Administered 2020-12-22: 100 mg via INTRAVENOUS

## 2020-12-22 MED ORDER — MIDAZOLAM HCL 2 MG/2ML IJ SOLN
INTRAMUSCULAR | Status: AC
  Filled 2020-12-22: qty 2

## 2020-12-22 MED ORDER — PHENYLEPHRINE HCL (PRESSORS) 10 MG/ML IV SOLN
INTRAVENOUS | Status: DC | PRN
  Administered 2020-12-22 (×2): 200 ug via INTRAVENOUS
  Administered 2020-12-22 (×2): 100 ug via INTRAVENOUS
  Administered 2020-12-22: 200 ug via INTRAVENOUS
  Administered 2020-12-22: 100 ug via INTRAVENOUS
  Administered 2020-12-22: 300 ug via INTRAVENOUS
  Administered 2020-12-22 (×3): 100 ug via INTRAVENOUS
  Administered 2020-12-22: 200 ug via INTRAVENOUS
  Administered 2020-12-22 (×3): 100 ug via INTRAVENOUS

## 2020-12-22 MED ORDER — PROPOFOL 10 MG/ML IV BOLUS
INTRAVENOUS | Status: DC | PRN
  Administered 2020-12-22: 100 mg via INTRAVENOUS

## 2020-12-22 MED ORDER — PROPOFOL 10 MG/ML IV BOLUS
INTRAVENOUS | Status: AC
  Filled 2020-12-22: qty 20

## 2020-12-22 MED ORDER — FENTANYL CITRATE (PF) 100 MCG/2ML IJ SOLN
INTRAMUSCULAR | Status: DC | PRN
  Administered 2020-12-22: 25 ug via INTRAVENOUS

## 2020-12-22 MED ORDER — SODIUM CHLORIDE 0.9 % IV SOLN: INTRAVENOUS | Status: DC

## 2020-12-22 MED ORDER — HEPARIN SODIUM (PORCINE) 1000 UNIT/ML IJ SOLN
INTRAMUSCULAR | Status: AC
  Filled 2020-12-22: qty 1

## 2020-12-22 MED ORDER — DIPHENHYDRAMINE HCL 50 MG/ML IJ SOLN: 50.0000 mg | Freq: Once | INTRAMUSCULAR | Status: DC | PRN

## 2020-12-22 MED ORDER — KETAMINE HCL 10 MG/ML IJ SOLN
INTRAMUSCULAR | Status: DC | PRN
  Administered 2020-12-22: 20 mg via INTRAVENOUS

## 2020-12-22 MED ORDER — CEFAZOLIN SODIUM-DEXTROSE 1-4 GM/50ML-% IV SOLN
1.0000 g | INTRAVENOUS | Status: AC
  Administered 2020-12-22: 1 g via INTRAVENOUS
  Filled 2020-12-22: qty 50

## 2020-12-22 MED ORDER — IODIXANOL 320 MG/ML IV SOLN
INTRAVENOUS | Status: DC | PRN
  Administered 2020-12-22: 85 mL

## 2020-12-22 MED ORDER — FENTANYL CITRATE (PF) 100 MCG/2ML IJ SOLN: 25.0000 ug | INTRAMUSCULAR | Status: DC | PRN

## 2020-12-22 MED ORDER — CEFAZOLIN SODIUM-DEXTROSE 1-4 GM/50ML-% IV SOLN
1.0000 g | Freq: Once | INTRAVENOUS | Status: DC
  Filled 2020-12-22: qty 50

## 2020-12-22 MED ORDER — METHYLPREDNISOLONE SODIUM SUCC 125 MG IJ SOLR: 125.0000 mg | Freq: Once | INTRAMUSCULAR | Status: DC | PRN

## 2020-12-22 MED ORDER — MIDAZOLAM HCL 2 MG/2ML IJ SOLN
INTRAMUSCULAR | Status: DC | PRN
  Administered 2020-12-22: 1 mg via INTRAVENOUS

## 2020-12-22 MED ORDER — VANCOMYCIN HCL IN DEXTROSE 1-5 GM/200ML-% IV SOLN
1000.0000 mg | INTRAVENOUS | Status: DC
  Filled 2020-12-22: qty 200

## 2020-12-22 MED ORDER — CEFAZOLIN SODIUM-DEXTROSE 1-4 GM/50ML-% IV SOLN
INTRAVENOUS | Status: AC
  Filled 2020-12-22: qty 50

## 2020-12-22 MED ORDER — FENTANYL CITRATE (PF) 100 MCG/2ML IJ SOLN
INTRAMUSCULAR | Status: AC
  Filled 2020-12-22: qty 2

## 2020-12-22 MED ORDER — LACTATED RINGERS IV SOLN: INTRAVENOUS | Status: DC | PRN

## 2020-12-22 MED ORDER — PIPERACILLIN-TAZOBACTAM IN DEX 2-0.25 GM/50ML IV SOLN
2.2500 g | Freq: Three times a day (TID) | INTRAVENOUS | Status: DC
  Administered 2020-12-22 – 2020-12-27 (×12): 2.25 g via INTRAVENOUS
  Filled 2020-12-22 (×18): qty 50

## 2020-12-22 MED ORDER — FAMOTIDINE 20 MG PO TABS: 40.0000 mg | ORAL_TABLET | Freq: Once | ORAL | Status: DC | PRN

## 2020-12-22 MED ORDER — KETAMINE HCL 50 MG/5ML IJ SOSY
PREFILLED_SYRINGE | INTRAMUSCULAR | Status: AC
  Filled 2020-12-22: qty 5

## 2020-12-22 MED ORDER — HEPARIN SODIUM (PORCINE) 1000 UNIT/ML IJ SOLN
INTRAMUSCULAR | Status: DC | PRN
  Administered 2020-12-22: 5000 [IU] via INTRAVENOUS

## 2020-12-22 MED ORDER — VASOPRESSIN 20 UNIT/ML IV SOLN
INTRAVENOUS | Status: DC | PRN
  Administered 2020-12-22 (×2): 1 [IU] via INTRAVENOUS

## 2020-12-22 SURGICAL SUPPLY — 33 items
BALLN DORADO 5X200X135 (BALLOONS) ×2
BALLN LUTONIX 018 4X220X130 (BALLOONS) ×2
BALLN LUTONIX 018 5X300X130 (BALLOONS) ×2
BALLN ULTRVRSE 018 2.5X100X150 (BALLOONS) ×2
BALLN ULTRVRSE 4X150X150 (BALLOONS) ×2
BALLOON DORADO 5X200X135 (BALLOONS) IMPLANT
BALLOON LUTONIX 018 4X220X130 (BALLOONS) IMPLANT
BALLOON LUTONIX 018 5X300X130 (BALLOONS) IMPLANT
BALLOON ULTRVRSE 4X150X150 (BALLOONS) IMPLANT
BALLOON ULTRVS 018 2.5X100X150 (BALLOONS) IMPLANT
CANNULA 5F STIFF (CANNULA) ×1 IMPLANT
CATH NAVICROSS ANGLED 135CM (MICROCATHETER) ×1 IMPLANT
CATH PIG 70CM (CATHETERS) ×1 IMPLANT
CATH ROTAREX 135 6FR (CATHETERS) ×1 IMPLANT
CATH TEMPO 5F RIM 65CM (CATHETERS) ×1 IMPLANT
CATH VERT 5FR 125CM (CATHETERS) ×1 IMPLANT
COVER PROBE U/S 5X48 (MISCELLANEOUS) ×1 IMPLANT
DEVICE SAFEGUARD 24CM (GAUZE/BANDAGES/DRESSINGS) ×1 IMPLANT
DEVICE STARCLOSE SE CLOSURE (Vascular Products) ×1 IMPLANT
GLIDECATH 4FR STR (CATHETERS) ×1 IMPLANT
GLIDEWIRE ADV .035X260CM (WIRE) ×1 IMPLANT
GUIDEWIRE SUPER STIFF .035X180 (WIRE) ×1 IMPLANT
KIT ENCORE 26 ADVANTAGE (KITS) ×1 IMPLANT
PACK ANGIOGRAPHY (CUSTOM PROCEDURE TRAY) ×2 IMPLANT
SHEATH ANL2 6FRX45 HC (SHEATH) ×2 IMPLANT
SHEATH BRITE TIP 5FRX11 (SHEATH) ×1 IMPLANT
STENT VIABAHN 6X150X120 (Permanent Stent) ×1 IMPLANT
STENT VIABAHN 6X250X120 (Permanent Stent) ×1 IMPLANT
SYR MEDRAD MARK 7 150ML (SYRINGE) ×1 IMPLANT
TUBING CONTRAST HIGH PRESS 20 (MISCELLANEOUS) ×2 IMPLANT
TUBING CONTRAST HIGH PRESS 48 (TUBING) ×1 IMPLANT
WIRE G V18X300CM (WIRE) ×2 IMPLANT
WIRE GUIDERIGHT .035X150 (WIRE) ×2 IMPLANT

## 2020-12-22 NOTE — Transfer of Care (Signed)
Immediate Anesthesia Transfer of Care Note  Patient: Lori [name]  Procedure(s) Performed: Lower Extremity Angiography (Left )  Patient Location: PACU  Anesthesia Type:General  Level of Consciousness: awake  Airway & Oxygen Therapy: Patient Spontanous Breathing and Patient connected to face mask oxygen  Post-op Assessment: Report given to RN  Post vital signs: Stable per patient preop values  Last Vitals:  Vitals Value Taken Time  BP    Temp    Pulse    Resp    SpO2      Last Pain:  Vitals:   12/22/20 0957  TempSrc:   PainSc: 0-No pain      Patients Stated Pain Goal: 1 ([id number])  Complications: No complications documented.

## 2020-12-22 NOTE — Interval H&P Note (Signed)
History and Physical Interval Note:  12/22/2020 1:05 PM  Lori [name]  has presented today for surgery, with the diagnosis of Ischemic left lower extremity.  The various methods of treatment have been discussed with the patient and family. After consideration of risks, benefits and other options for treatment, the patient has consented to  Procedure(s): Lower Extremity Angiography (Left) as a surgical intervention.  The patient's history has been reviewed, patient examined, no change in status, stable for surgery.  I have reviewed the patient's chart and labs.  Questions were answered to the patient's satisfaction.     Leotis Pain

## 2020-12-22 NOTE — Op Note (Signed)
[facility]  Percutaneous Study/Intervention Procedural Note   Date of Surgery: 12/22/2020  Surgeon(s):Brettney Ficken    Assistants:none  Pre-operative Diagnosis: PAD with gangrene left foot  Post-operative diagnosis:  Same  Procedure(s) Performed:             1.  Ultrasound guidance for vascular access right femoral artery             2.  Catheter placement into left common femoral artery from right femoral approach             3.  Aortogram and selective left lower extremity angiogram             4.  Percutaneous transluminal angioplasty of left peroneal artery with 2.5 mm diameter angioplasty balloon             5.   Mechanical thrombectomy with the roto-Rex device to the left SFA and proximal popliteal artery  6.  Percutaneous transluminal angioplasty of the left SFA and popliteal arteries with 5 mm diameter Lutonix and high-pressure angioplasty balloons  7.  Viabahn stent placement x2 to the left SFA and popliteal arteries with 6 mm diameter by 25 cm length and 6 mm diameter by 15 cm length stents             8.  StarClose closure device right femoral artery  EBL: 100 cc  Contrast: 85 cc  Fluoro Time: 19.3 minutes  Anesthesia: general              Indications:  Patient is a 50 y.o.female with gangrenous changes to the left foot and extensive history of peripheral vascular disease. The patient is brought in for angiography for further evaluation and potential treatment.  Due to the limb threatening nature of the situation, angiogram was performed for attempted limb salvage. The patient is aware that if the procedure fails, amputation would be expected.  The patient also understands that even with successful revascularization, amputation may still be required due to the severity of the situation.  Risks and benefits are discussed and informed consent is obtained.   Procedure:  The patient was identified and appropriate procedural time out was performed.  The  patient was then placed supine on the table and prepped and draped in the usual sterile fashion. Moderate conscious sedation was administered during a face to face encounter with the patient throughout the procedure with my supervision of the RN administering medicines and monitoring the patient's vital signs, pulse oximetry, telemetry and mental status throughout from the start of the procedure until the patient was taken to the recovery room. Ultrasound was used to evaluate the right common femoral artery.  It was patent .  A digital ultrasound image was acquired.  A Seldinger needle was used to access the right common femoral artery under direct ultrasound guidance and a permanent image was performed.  A 0.035 J wire was advanced without resistance and a 5Fr sheath was placed.  Pigtail catheter was placed into the aorta and an AP aortogram was performed. Renal artery flow not really well seen.  This may have been due to catheter position or longstanding end-stage renal disease.  The aorta was highly calcific.  The right common iliac artery had an 80 to 90% stenosis.  The left common iliac artery had more mild disease in the 40% range.  Both external iliac arteries appeared reasonably normal.  The iliac arteries were highly tortuous in the aortic bifurcation was somewhat steep. I then  crossed the aortic bifurcation and advanced to the left femoral head. Selective left lower extremity angiogram was then performed. This demonstrated stented segments in the SFA starting at its origin down to Hunter's canal that had extensive high-grade stenosis throughout its course from intimal hyperplasia.  Below the stents, there was about a 70 to 80% stenosis in the proximal popliteal artery and another stenosis of greater than 95% in the above-knee popliteal artery.  The below-knee popliteal artery normalized.  The anterior tibial artery was continuous and patent to the foot.  The peroneal artery had a short segment high-grade  stenosis in the 80 to 90% range in the mid segment.  The posterior tibial artery was diffusely diseased. It was felt that it was in the patient's best interest to proceed with intervention after these images to avoid a second procedure and a larger amount of contrast and fluoroscopy based off of the findings from the initial angiogram. The patient was systemically heparinized and a 6 [place] Ansell sheath was then placed over the [employer] wire. I then used a Kumpe catheter and the advantage wire to navigate through the long segment SFA and popliteal disease and then get down into the tibial vessels.  I then exchanged for a Ferd Hibbs cross catheter and a V 18 wire and cross the stenosis in the peroneal artery.  The catheter was then removed.  A 2.5 mm diameter by 10 cm length angioplasty balloon was inflated to 12 atm for 1 minute in the proximal to mid peroneal artery.  Completion imaging showed only about a 15 to 20% residual stenosis after angioplasty.  I then made 2 passes with the roto-Rex device in the left SFA and popliteal artery for the in-stent stenosis with a significant amount of chronic thrombus removed although significant residual stenosis remained throughout much of the course as well as in the above-knee popliteal artery.  I then performed angioplasty with a 5 mm diameter by 30 cm length Lutonix drug-coated angioplasty balloon and a 5 mm diameter by 22 cm length Lutonix drug-coated angioplasty balloon.  A high-pressure angioplasty balloon was then brought on the field due to residual waist in the mid to distal SFA within the previous stents.  Completion imaging showed multiple areas of high-grade residual stenosis and chronic thrombus in the left SFA and proximal popliteal artery.  I elected to treat these areas with covered stents.  A 6 mm diameter by 25 cm length Viabahn stent was then deployed from the mid popliteal artery up to the mid SFA.  A 6 mm diameter by 15 cm length Viabahn stent was  then deployed bridging from the stent up to the origin of the SFA to encompass the previously placed stents.  These were postdilated with 5 mm diameter high-pressure angioplasty balloons with less than 10% residual stenosis.  As for her iliac disease, the bulk of her disease was on the right side which had no current limb threatening symptoms and the left common iliac disease was relatively mild so I elected not to treat this today.  She had been under general anesthesia for an extended time and this been a very difficult case with significant contrast used in a dialysis patient.  This may be addressed with kissing stents from bilateral femoral approaches in the near future. I elected to terminate the procedure. The sheath was removed and StarClose closure device was deployed in the right femoral artery with excellent hemostatic result. The patient was taken to the recovery room in stable  condition having tolerated the procedure well.  Findings:               Aortogram:  Renal artery flow not really well seen.  This may have been due to catheter position or longstanding end-stage renal disease.  The aorta was highly calcific.  The right common iliac artery had an 80 to 90% stenosis.  The left common iliac artery had more mild disease in the 40% range.  Both external iliac arteries appeared reasonably normal.  The iliac arteries were highly tortuous in the aortic bifurcation was somewhat steep.             Left Lower Extremity:  This demonstrated stented segments in the SFA starting at its origin down to Hunter's canal that had extensive high-grade stenosis throughout its course from intimal hyperplasia.  Below the stents, there was about a 70 to 80% stenosis in the proximal popliteal artery and another stenosis of greater than 95% in the above-knee popliteal artery.  The below-knee popliteal artery normalized.  The anterior tibial artery was continuous and patent to the foot.  The peroneal artery had a short  segment high-grade stenosis in the 80 to 90% range in the mid segment.  The posterior tibial artery was diffusely diseased   Disposition: Patient was taken to the recovery room in stable condition having tolerated the procedure well.  Complications: None  Leotis Pain 12/22/2020 5:22 PM   This note was created with Dragon Medical transcription system. Any errors in dictation are purely unintentional.

## 2020-12-22 NOTE — Progress Notes (Signed)
UF off and 200 NS given due to low BP RN and doctor aware.

## 2020-12-22 NOTE — Progress Notes (Signed)
Patient ID: Lori [name], female   DOB: 1971-11-04, 50 y.o.   MRN: [id number]  PROGRESS NOTE    Lori [name]  [id number] DOB: 1971-04-29 DOA: 12/20/2020 PCP: Patient, No Pcp Per   Brief Narrative:  50 y.o. female with medical history significant for CAD s/p stent to LAD in 2020, ESRD on HD MWF, hypertension, hypothyroidism, hyperparathyroidism, GERD who was initially admitted to [facility] on 12/14/2020 for a left foot wound.  She was started on antibiotics.  General surgery evaluated the patient and opined that she may ultimately need a left AKA.  Patient wanted to discuss with her vascular surgeon Dr. Delana Meyer before any surgery would be considered.  She was transferred to [facility] for further evaluation.  At [facility], she was found to be incidentally Covid positive on 12/14/2020 but remained asymptomatic and was treated with 3 days of IV remdesivir.  Nephrology has been following for continuation of dialysis.  Wound culture from 12/18/2020 grew Enterococcus and Proteus.  Assessment & Plan:   Left foot wound infection with gangrene/chronic ischemia of the left lower extremity -Dry necrotic/ischemic appearance of left fourth and fifth toes -Transferred from [name] for vascular surgery evaluation: Vascular surgery planning for angiogram today -Currently on broad-spectrum antibiotics.  Wound culture grew Enterococcus faecalis and Proteus vulgaris  Leukocytosis -From above.  Monitor  End-stage renal disease on hemodialysis -Nephrology following.  Continue dialysis as per nephrology schedule  Anemia of chronic disease -From renal failure.  Hemoglobin stable.  COVID-19 positive -Incidentally Covid + 12/14/2020.  She has been asymptomatic.  She states she has been fully vaccinated including booster dose.  She received 3 days IV remdesivir.  Currently no need for further treatment.  Remain on airborne precautions.  Paroxysmal atrial fibrillation: -Not on anticoagulation or  currently on rate/rhythm controlling agents.    Continue aspirin.  CAD: -Continue aspirin, Coreg and Brilinta. -Not currently on statin  Hypertension: -Monitor blood pressure.  Continue Coreg  Hypothyroidism: -Continue Synthroid   DVT prophylaxis: Heparin Code Status: Full Family Communication: None Disposition Plan: Status is: Inpatient  Remains inpatient appropriate because:Inpatient level of care appropriate due to severity of illness   Dispo: The patient is from: Home              Anticipated d/c is to: Home              Anticipated d/c date is: > 3 days              Patient currently is not medically stable to d/c.   Difficult to place patient No   Consultants: Vascular surgery/nephrology  Procedures: None  Antimicrobials:  Anti-infectives (From admission, onward)   Start     Dose/Rate Route Frequency Ordered Stop   12/22/20 1330  ceFAZolin (ANCEF) IVPB 1 g/50 mL premix       Note to Pharmacy: To be given in specials   1 g 100 mL/hr over 30 Minutes Intravenous 60 min pre-op 12/22/20 0052     12/22/20 0145  ceFAZolin (ANCEF) IVPB 1 g/50 mL premix  Status:  Discontinued       Note to Pharmacy: To be given in specials   1 g 100 mL/hr over 30 Minutes Intravenous  Once 12/22/20 0048 12/22/20 0050   12/21/20 1200  ceFEPIme (MAXIPIME) 2 g in sodium chloride 0.9 % 100 mL IVPB        2 g 200 mL/hr over 30 Minutes Intravenous Every M-W-F (Hemodialysis) 12/20/20 2038  12/21/20 1200  vancomycin (VANCOCIN) IVPB 1000 mg/200 mL premix        1,000 mg 200 mL/hr over 60 Minutes Intravenous Every M-W-F (Hemodialysis) 12/20/20 2038     12/20/20 2200  metroNIDAZOLE (FLAGYL) tablet 500 mg        500 mg Oral Every 8 hours 12/20/20 2038         Subjective: Patient seen and examined at bedside.  No overnight fever, worsening shortness breath, chest pain or cough reported. Objective: Vitals:   12/21/20 1551 12/21/20 1621 12/21/20 2106 12/22/20 0427  BP: (!) 130/111 (!)  161/125 (!) 120/55 135/90  Pulse: (!) 59 85 90 97  Resp: '14 19 18 20  '$ Temp: 98 F (36.7 C) 97.8 F (36.6 C) 97.9 F (36.6 C) 97.9 F (36.6 C)  TempSrc: Oral Oral Oral Oral  SpO2: 100% 98% 99% 97%  Weight:    (P) 110 kg  Height:        Intake/Output Summary (Last 24 hours) at 12/22/2020 0745 Last data filed at 12/22/2020 0136 Gross per 24 hour  Intake -  Output 0 ml  Net 0 ml   Filed Weights   12/20/20 1858 12/21/20 0112 12/22/20 0427  Weight: 102.9 kg 109.2 kg (P) 110 kg    Examination:  General exam: No acute distress.  On room air.   Respiratory system: Decreased breath sounds at bases bilaterally with scattered crackles cardiovascular system: Rate controlled, S1-S2 heard Gastrointestinal system: Abdomen is nondistended, soft and nontender.  Bowel sounds are heard  extremities: Mild lower extremity edema; no cyanosis  Central nervous system: Awake and alert.  No focal neurological deficits.  Moves extremities Skin: Dry necrotic appearance of left fourth and fifth toes with surrounding erythema but no drainage. Psychiatry: Affect is flat   Data Reviewed: I have personally reviewed following labs and imaging studies  CBC: Recent Labs  Lab 12/18/20 0411 12/19/20 0826 12/20/20 0328 12/21/20 0440 12/22/20 0441  WBC 16.3* 17.3* 15.1* 15.9* 14.0*  NEUTROABS 13.4* 13.1* 10.4* 10.2* 8.8*  HGB 10.6* 12.3 11.7* 9.7* 10.1*  HCT 32.5* 38.2 37.4 30.8* 31.1*  MCV 97.6 99.5 100.3* 99.4 98.1  PLT 157 145* 153 141* [id number]*   Basic Metabolic Panel: Recent Labs  Lab 12/16/20 0417 12/17/20 0506 12/18/20 0411 12/19/20 0826 12/20/20 0328 12/21/20 0440 12/22/20 0441  NA 130* 133* 134* 131* 135 135 135  K 4.9 4.3 4.8 5.3* 4.4 4.6 5.3*  CL 93* 95* 95* 96* 95* 97* 97*  CO2 18* 22 21* 18* '24 23 23  '$ GLUCOSE 79 85 82 89 107* 81 75  BUN 53* 32* 43* 59* 32* 40* 50*  CREATININE 11.27* 7.51* 9.09* 11.56* 7.49* 9.14* 10.68*  CALCIUM 7.9* 8.4* 8.5* 8.5* 8.5* 8.8* 8.9  PHOS 7.4* 5.7*  7.0* 7.5* 4.7*  --   --    GFR: Estimated Creatinine Clearance: 7.4 mL/min (A) (by C-G formula based on SCr of 10.68 mg/dL (H)). Liver Function Tests: Recent Labs  Lab 12/17/20 0506 12/18/20 0411 12/19/20 0826 12/20/20 0328 12/21/20 0440  AST  --   --   --   --  32  ALT  --   --   --   --  27  ALKPHOS  --   --   --   --  56  BILITOT  --   --   --   --  0.7  PROT  --   --   --   --  6.2*  ALBUMIN 3.2* 3.3*  3.2* 3.1* 3.1*   No results for input(s): LIPASE, AMYLASE in the last 168 hours. No results for input(s): AMMONIA in the last 168 hours. Coagulation Profile: No results for input(s): INR, PROTIME in the last 168 hours. Cardiac Enzymes: No results for input(s): CKTOTAL, CKMB, CKMBINDEX, TROPONINI in the last 168 hours. BNP (last 3 results) No results for input(s): PROBNP in the last 8760 hours. HbA1C: No results for input(s): HGBA1C in the last 72 hours. CBG: No results for input(s): GLUCAP in the last 168 hours. Lipid Profile: No results for input(s): CHOL, HDL, LDLCALC, TRIG, CHOLHDL, LDLDIRECT in the last 72 hours. Thyroid Function Tests: No results for input(s): TSH, T4TOTAL, FREET4, T3FREE, THYROIDAB in the last 72 hours. Anemia Panel: No results for input(s): VITAMINB12, FOLATE, FERRITIN, TIBC, IRON, RETICCTPCT in the last 72 hours. Sepsis Labs: No results for input(s): PROCALCITON, LATICACIDVEN in the last 168 hours.  Recent Results (from the past 240 hour(s))  Blood culture (routine x 2)     Status: None   Collection Time: 12/14/20  2:18 PM   Specimen: Right Antecubital; Blood  Result Value Ref Range Status   Specimen Description RIGHT ANTECUBITAL  Final   Special Requests   Final    BOTTLES DRAWN AEROBIC AND ANAEROBIC Blood Culture results may not be optimal due to an inadequate volume of blood received in culture bottles   Culture   Final    NO GROWTH 5 DAYS Performed at [facility], [address], [place], [place] [address]    Report Status 12/19/2020  FINAL  Final  Resp Panel by RT-PCR (Flu A&B, Covid) Nasopharyngeal Swab     Status: Abnormal   Collection Time: 12/14/20  7:21 PM   Specimen: Nasopharyngeal Swab; Nasopharyngeal(NP) swabs in vial transport medium  Result Value Ref Range Status   SARS Coronavirus 2 by RT PCR POSITIVE (A) NEGATIVE Final    Comment: RESULT CALLED TO, READ BACK BY AND VERIFIED WITH: HARRY,A @ 2016 ON 12/14/20 BY JUW (NOTE) SARS-CoV-2 target nucleic acids are DETECTED.  The SARS-CoV-2 RNA is generally detectable in upper respiratory specimens during the acute phase of infection. Positive results are indicative of the presence of the identified virus, but do not rule out bacterial infection or co-infection with other pathogens not detected by the test. Clinical correlation with patient history and other diagnostic information is necessary to determine patient infection status. The expected result is Negative.  Fact Sheet for Patients: EntrepreneurPulse.com.au  Fact Sheet for Healthcare Providers: IncredibleEmployment.be  This test is not yet approved or cleared by the [place] FDA and  has been authorized for detection and/or diagnosis of SARS-CoV-2 by FDA under an Emergency Use Authorization (EUA).  This EUA will remain in effect (meaning this test can be  used) for the duration of  the COVID-19 declaration under Section 564(b)(1) of the Act, 21 U.S.C. section 360bbb-3(b)(1), unless the authorization is terminated or revoked sooner.     Influenza A by PCR NEGATIVE NEGATIVE Final   Influenza B by PCR NEGATIVE NEGATIVE Final    Comment: (NOTE) The Xpert Xpress SARS-CoV-2/FLU/RSV plus assay is intended as an aid in the diagnosis of influenza from Nasopharyngeal swab specimens and should not be used as a sole basis for treatment. Nasal washings and aspirates are unacceptable for Xpert Xpress SARS-CoV-2/FLU/RSV testing.  Fact Sheet for  Patients: EntrepreneurPulse.com.au  Fact Sheet for Healthcare Providers: IncredibleEmployment.be  This test is not yet approved or cleared by the [place] and has been authorized  for detection and/or diagnosis of SARS-CoV-2 by FDA under an Emergency Use Authorization (EUA). This EUA will remain in effect (meaning this test can be used) for the duration of the COVID-19 declaration under Section 564(b)(1) of the Act, 21 U.S.C. section 360bbb-3(b)(1), unless the authorization is terminated or revoked.  Performed at [facility], [address], [place], [place] [address]   Blood culture (routine x 2)     Status: None   Collection Time: 12/14/20  9:25 PM   Specimen: Neck; Blood  Result Value Ref Range Status   Specimen Description NECK  Final   Special Requests   Final    BOTTLES DRAWN AEROBIC AND ANAEROBIC Blood Culture adequate volume   Culture   Final    NO GROWTH 5 DAYS Performed at [facility], [address], [place], [place] [address]    Report Status 12/19/2020 FINAL  Final  Aerobic Culture (superficial specimen)     Status: None (Preliminary result)   Collection Time: 12/18/20  8:38 AM   Specimen: Wound  Result Value Ref Range Status   Specimen Description   Final    WOUND Performed at [facility], [address] [place], [place] [address]    Special Requests   Final    LEFT FOOT Performed at [facility], [address], [place], [place] [address]    Gram Stain   Final    RARE WBC PRESENT, PREDOMINANTLY MONONUCLEAR FEW GRAM POSITIVE COCCI RARE YEAST    Culture   Final    MODERATE ENTEROCOCCUS FAECALIS MODERATE PROTEUS VULGARIS SUSCEPTIBILITIES TO FOLLOW Performed at [facility], [place] [address] [place], [place] [address]    Report Status PENDING  Incomplete   Organism ID, Bacteria ENTEROCOCCUS FAECALIS  Final      Susceptibility   Enterococcus faecalis - MIC*    AMPICILLIN <=2 SENSITIVE Sensitive      VANCOMYCIN 2 SENSITIVE Sensitive     GENTAMICIN SYNERGY SENSITIVE Sensitive     * MODERATE ENTEROCOCCUS FAECALIS         Radiology Studies: No results found.      Scheduled Meds: . aspirin EC  81 mg Oral Daily  . carvedilol  3.125 mg Oral BID WC  . Chlorhexidine Gluconate Cloth  6 each Topical Daily  . gabapentin  100 mg Oral QHS  . heparin  5,000 Units Subcutaneous Q8H  . levothyroxine  300 mcg Oral QHS  . metroNIDAZOLE  500 mg Oral Q8H  . pantoprazole  40 mg Oral QHS  . sevelamer carbonate  3,200 mg Oral TID WC  . ticagrelor  90 mg Oral BID   Continuous Infusions: . sodium chloride 10 mL/hr at 12/22/20 0421  .  ceFAZolin (ANCEF) IV    . ceFEPIme (MAXIPIME) 2 GM IVPB (Mini-Bag Plus) 2 g (12/21/20 1444)  . vancomycin            Aline August, MD [facility] 12/22/2020, 7:45 AM

## 2020-12-22 NOTE — Progress Notes (Signed)
[facility]  ROUNDING NOTE   Subjective:   Seen and examined on hemodialysis treatment. Tolerating treatment well.   Objective:  Vital signs in last 24 hours:  Temp:  [97.6 F (36.4 C)-98 F (36.7 C)] 97.9 F (36.6 C) (01/27 0427) Pulse Rate:  [59-108] 108 (01/27 0901) Resp:  [14-20] 20 (01/27 0427) BP: (112-161)/(55-125) 112/100 (01/27 0901) SpO2:  [97 %-100 %] 100 % (01/27 0901) Weight:  [110 kg] (P) 110 kg (01/27 0427)  Weight change:  Filed Weights   12/20/20 1858 12/21/20 0112 12/22/20 0427  Weight: 102.9 kg 109.2 kg (P) 110 kg    Intake/Output: No intake/output data recorded.   Intake/Output this shift:  No intake/output data recorded.  Physical Exam: General: NAD, laying in bed  Head: Normocephalic, atraumatic. Moist oral mucosal membranes  Eyes: Anicteric, PERRL  Neck: Supple, trachea midline  Lungs:  Clear to auscultation  Heart: Regular rate and rhythm  Abdomen:  Soft, nontender, obese  Extremities:  left gangrene of fourth and fifth toe  Neurologic: Nonfocal, moving all four extremities  Skin: No lesions  Access: RIJ permcath    Basic Metabolic Panel: Recent Labs  Lab 12/17/20 0506 12/18/20 0411 12/19/20 0826 12/20/20 0328 12/21/20 0440 12/22/20 0441  NA 133* 134* 131* 135 135 135  K 4.3 4.8 5.3* 4.4 4.6 5.3*  CL 95* 95* 96* 95* 97* 97*  CO2 22 21* 18* '24 23 23  '$ GLUCOSE 85 82 89 107* 81 75  BUN 32* 43* 59* 32* 40* 50*  CREATININE 7.51* 9.09* 11.56* 7.49* 9.14* 10.68*  CALCIUM 8.4* 8.5* 8.5* 8.5* 8.8* 8.9  PHOS 5.7* 7.0* 7.5* 4.7*  --  6.0*    Liver Function Tests: Recent Labs  Lab 12/17/20 0506 12/18/20 0411 12/19/20 0826 12/20/20 0328 12/21/20 0440  AST  --   --   --   --  32  ALT  --   --   --   --  27  ALKPHOS  --   --   --   --  56  BILITOT  --   --   --   --  0.7  PROT  --   --   --   --  6.2*  ALBUMIN 3.2* 3.3* 3.2* 3.1* 3.1*   No results for input(s): LIPASE, AMYLASE in the last 168 hours. No results for  input(s): AMMONIA in the last 168 hours.  CBC: Recent Labs  Lab 12/18/20 0411 12/19/20 0826 12/20/20 0328 12/21/20 0440 12/22/20 0441  WBC 16.3* 17.3* 15.1* 15.9* 14.0*  NEUTROABS 13.4* 13.1* 10.4* 10.2* 8.8*  HGB 10.6* 12.3 11.7* 9.7* 10.1*  HCT 32.5* 38.2 37.4 30.8* 31.1*  MCV 97.6 99.5 100.3* 99.4 98.1  PLT 157 145* 153 141* 147*    Cardiac Enzymes: No results for input(s): CKTOTAL, CKMB, CKMBINDEX, TROPONINI in the last 168 hours.  BNP: Invalid input(s): POCBNP  CBG: No results for input(s): GLUCAP in the last 168 hours.  Microbiology: Results for orders placed or performed during the hospital encounter of 12/14/20  Blood culture (routine x 2)     Status: None   Collection Time: 12/14/20  2:18 PM   Specimen: Right Antecubital; Blood  Result Value Ref Range Status   Specimen Description RIGHT ANTECUBITAL  Final   Special Requests   Final    BOTTLES DRAWN AEROBIC AND ANAEROBIC Blood Culture results may not be optimal due to an inadequate volume of blood received in culture bottles   Culture   Final  NO GROWTH 5 DAYS Performed at [facility], [address], [place], [place] [address]    Report Status 12/19/2020 FINAL  Final  Resp Panel by RT-PCR (Flu A&B, Covid) Nasopharyngeal Swab     Status: Abnormal   Collection Time: 12/14/20  7:21 PM   Specimen: Nasopharyngeal Swab; Nasopharyngeal(NP) swabs in vial transport medium  Result Value Ref Range Status   SARS Coronavirus 2 by RT PCR POSITIVE (A) NEGATIVE Final    Comment: RESULT CALLED TO, READ BACK BY AND VERIFIED WITH: HARRY,A @ 2016 ON 12/14/20 BY JUW (NOTE) SARS-CoV-2 target nucleic acids are DETECTED.  The SARS-CoV-2 RNA is generally detectable in upper respiratory specimens during the acute phase of infection. Positive results are indicative of the presence of the identified virus, but do not rule out bacterial infection or co-infection with other pathogens not detected by the test. Clinical correlation  with patient history and other diagnostic information is necessary to determine patient infection status. The expected result is Negative.  Fact Sheet for Patients: EntrepreneurPulse.com.au  Fact Sheet for Healthcare Providers: IncredibleEmployment.be  This test is not yet approved or cleared by the [place] FDA and  has been authorized for detection and/or diagnosis of SARS-CoV-2 by FDA under an Emergency Use Authorization (EUA).  This EUA will remain in effect (meaning this test can be  used) for the duration of  the COVID-19 declaration under Section 564(b)(1) of the Act, 21 U.S.C. section 360bbb-3(b)(1), unless the authorization is terminated or revoked sooner.     Influenza A by PCR NEGATIVE NEGATIVE Final   Influenza B by PCR NEGATIVE NEGATIVE Final    Comment: (NOTE) The Xpert Xpress SARS-CoV-2/FLU/RSV plus assay is intended as an aid in the diagnosis of influenza from Nasopharyngeal swab specimens and should not be used as a sole basis for treatment. Nasal washings and aspirates are unacceptable for Xpert Xpress SARS-CoV-2/FLU/RSV testing.  Fact Sheet for Patients: EntrepreneurPulse.com.au  Fact Sheet for Healthcare Providers: IncredibleEmployment.be  This test is not yet approved or cleared by the [place] FDA and has been authorized for detection and/or diagnosis of SARS-CoV-2 by FDA under an Emergency Use Authorization (EUA). This EUA will remain in effect (meaning this test can be used) for the duration of the COVID-19 declaration under Section 564(b)(1) of the Act, 21 U.S.C. section 360bbb-3(b)(1), unless the authorization is terminated or revoked.  Performed at [facility], [address] [place], [place] [address]   Blood culture (routine x 2)     Status: None   Collection Time: 12/14/20  9:25 PM   Specimen: Neck; Blood  Result Value Ref Range Status   Specimen Description  NECK  Final   Special Requests   Final    BOTTLES DRAWN AEROBIC AND ANAEROBIC Blood Culture adequate volume   Culture   Final    NO GROWTH 5 DAYS Performed at [facility], [address] [place], [place] [address]    Report Status 12/19/2020 FINAL  Final  Aerobic Culture (superficial specimen)     Status: None (Preliminary result)   Collection Time: 12/18/20  8:38 AM   Specimen: Wound  Result Value Ref Range Status   Specimen Description   Final    WOUND Performed at [facility], [address] [place], [place] [address]    Special Requests   Final    LEFT FOOT Performed at [facility], [address], [place], [place] [address]    Gram Stain   Final    RARE WBC PRESENT,  PREDOMINANTLY MONONUCLEAR FEW GRAM POSITIVE COCCI RARE YEAST    Culture   Final    MODERATE ENTEROCOCCUS FAECALIS MODERATE PROTEUS VULGARIS SUSCEPTIBILITIES TO FOLLOW Performed at [facility], [place] [address], [place], [place] [address]    Report Status PENDING  Incomplete   Organism ID, Bacteria ENTEROCOCCUS FAECALIS  Final      Susceptibility   Enterococcus faecalis - MIC*    AMPICILLIN <=2 SENSITIVE Sensitive     VANCOMYCIN 2 SENSITIVE Sensitive     GENTAMICIN SYNERGY SENSITIVE Sensitive     * MODERATE ENTEROCOCCUS FAECALIS    Coagulation Studies: No results for input(s): LABPROT, INR in the last 72 hours.  Urinalysis: No results for input(s): COLORURINE, LABSPEC, PHURINE, GLUCOSEU, HGBUR, BILIRUBINUR, KETONESUR, PROTEINUR, UROBILINOGEN, NITRITE, LEUKOCYTESUR in the last 72 hours.  Invalid input(s): APPERANCEUR    Imaging: No results found.   Medications:   . sodium chloride 10 mL/hr at 12/22/20 0421  .  ceFAZolin (ANCEF) IV    . ceFEPIme (MAXIPIME) 2 GM IVPB (Mini-Bag Plus) 2 g (12/21/20 1444)  . vancomycin     . aspirin EC  81 mg Oral Daily  . carvedilol  3.125 mg Oral BID WC  . Chlorhexidine Gluconate Cloth  6 each Topical Daily  . gabapentin  100 mg Oral QHS  . heparin   5,000 Units Subcutaneous Q8H  . levothyroxine  300 mcg Oral QHS  . metroNIDAZOLE  500 mg Oral Q8H  . pantoprazole  40 mg Oral QHS  . sevelamer carbonate  3,200 mg Oral TID WC  . ticagrelor  90 mg Oral BID   acetaminophen, albuterol, diphenhydrAMINE, famotidine, HYDROcodone-acetaminophen, methylPREDNISolone (SOLU-MEDROL) injection, midazolam, morphine injection, ondansetron **OR** ondansetron (ZOFRAN) IV, senna-docusate, sevelamer carbonate  Assessment/ Plan:  [name] is a 50 y.o. white female with end stage renal disease on hemodialysis, coronary artery disease, hypertension, peripheral vascular disease, hypothyroidism, who was admitted to [facility] on 12/20/2020 for Cellulitis of fourth toe of left foot [L03.032]  [facility]) MWF [facility] RIJ permcath 101.5kg  1. End Stage Renal Disease: seen and examined on hemodialysis treatment. Tolerating treatment well.   2. Hypertension: on carvedilol. However patient has a history of requiring midodrine.   3. Anemia of chronic kidney disease: hemoglobin 10.1 - holding epo due to ischemia  4. Secondary Hyperparathyroidism:  - Sevelamer with meals  5. Peripheral vascular disease: with ischemic changes  - Appreciate vascular input. Angiogram for later today.    LOS: 2 Kemon Devincenzi 1/27/202211:24 AM

## 2020-12-22 NOTE — Anesthesia Preprocedure Evaluation (Signed)
Anesthesia Evaluation  Patient identified by MRN, date of birth, ID band Patient awake    Reviewed: Allergy & Precautions, H&P , NPO status , Patient's Chart, lab work & pertinent test results, reviewed documented beta blocker date and time   History of Anesthesia Complications Negative for: history of anesthetic complications  Airway Mallampati: III  TM Distance: >3 FB Neck ROM: full    Dental  (+) Dental Advidsory Given, Poor Dentition, Missing   Pulmonary shortness of breath and with exertion, asthma , neg sleep apnea, neg COPD, Recent URI  (Covid positive and is s/p treatment), former smoker,    Pulmonary exam normal breath sounds clear to auscultation       Cardiovascular Exercise Tolerance: Good hypertension, (-) angina+ CAD, + Past MI, + Cardiac Stents and + Peripheral Vascular Disease  (-) CABG + dysrhythmias Atrial Fibrillation (-) Valvular Problems/Murmurs Rhythm:regular Rate:Normal     Neuro/Psych negative neurological ROS  negative psych ROS   GI/Hepatic Neg liver ROS, GERD  ,  Endo/Other  neg diabetesHypothyroidism Morbid obesity  Renal/GU ESRF and DialysisRenal disease  negative genitourinary   Musculoskeletal   Abdominal   Peds  Hematology negative hematology ROS (+)   Anesthesia Other Findings Past Medical History: No date: Anemia No date: ASCVD (arteriosclerotic cardiovascular disease)     Comment:  a. s/p prior stenting of LAD b. 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 peitonitis; difficult vascular access No date:  GERD (gastroesophageal reflux disease) No date: Hashimoto thyroiditis No date: Hyperparathyroidism No date: Hypertension No date: Hypothyroidism No date: Medically noncompliant No date: Morbid obesity ([place])   Reproductive/Obstetrics negative OB ROS                             Anesthesia Physical Anesthesia Plan  ASA: IV  Anesthesia Plan: General   Post-op Pain Management:    Induction: Intravenous  PONV Risk Score and Plan: 3 and Ondansetron, Treatment may vary due to age or medical condition and Midazolam  Airway Management Planned: LMA  Additional Equipment:   Intra-op Plan:   Post-operative Plan: Extubation in OR  Informed Consent: I have reviewed the patients History and Physical, chart, labs and discussed the procedure including the risks, benefits and alternatives for the proposed anesthesia with the patient or authorized representative who has indicated his/her understanding and acceptance.     Dental Advisory Given  Plan Discussed with: Anesthesiologist, CRNA and Surgeon  Anesthesia Plan Comments:         Anesthesia Quick Evaluation

## 2020-12-22 NOTE — Anesthesia Procedure Notes (Signed)
Procedure Name: LMA Insertion Date/Time: 12/22/2020 3:59 PM Performed by: Lia Foyer, CRNA Pre-anesthesia Checklist: Patient identified, Emergency Drugs available, Suction available and Patient being monitored Patient Re-evaluated:Patient Re-evaluated prior to induction Oxygen Delivery Method: Circle system utilized Preoxygenation: Pre-oxygenation with 100% oxygen Induction Type: IV induction Ventilation: Mask ventilation without difficulty LMA: LMA flexible inserted LMA Size: 4.5 Tube type: Oral Number of attempts: 1 Placement Confirmation: positive ETCO2 and breath sounds checked- equal and bilateral Tube secured with: Tape Dental Injury: Teeth and Oropharynx as per pre-operative assessment

## 2020-12-23 ENCOUNTER — Inpatient Hospital Stay: Payer: Medicare Other

## 2020-12-23 ENCOUNTER — Encounter: Payer: Self-pay | Admitting: Vascular Surgery

## 2020-12-23 DIAGNOSIS — R234 Changes in skin texture: Secondary | ICD-10-CM

## 2020-12-23 LAB — CBC WITH DIFFERENTIAL/PLATELET
Abs Immature Granulocytes: 2.54 10*3/uL — ABNORMAL HIGH (ref 0.00–0.07)
Basophils Absolute: 0.1 10*3/uL (ref 0.0–0.1)
Basophils Relative: 0 %
Eosinophils Absolute: 0 10*3/uL (ref 0.0–0.5)
Eosinophils Relative: 0 %
HCT: 31.5 % — ABNORMAL LOW (ref 36.0–46.0)
Hemoglobin: 9.7 g/dL — ABNORMAL LOW (ref 12.0–15.0)
Immature Granulocytes: 11 %
Lymphocytes Relative: 3 %
Lymphs Abs: 0.7 10*3/uL (ref 0.7–4.0)
MCH: 31.3 pg (ref 26.0–34.0)
MCHC: 30.8 g/dL (ref 30.0–36.0)
MCV: 101.6 fL — ABNORMAL HIGH (ref 80.0–100.0)
Monocytes Absolute: 0.6 10*3/uL (ref 0.1–1.0)
Monocytes Relative: 3 %
Neutro Abs: 18.8 10*3/uL — ABNORMAL HIGH (ref 1.7–7.7)
Neutrophils Relative %: 83 %
Platelets: 165 10*3/uL (ref 150–400)
RBC: 3.1 MIL/uL — ABNORMAL LOW (ref 3.87–5.11)
RDW: 16.3 % — ABNORMAL HIGH (ref 11.5–15.5)
Smear Review: NORMAL
WBC: 22.7 10*3/uL — ABNORMAL HIGH (ref 4.0–10.5)
nRBC: 0 % (ref 0.0–0.2)

## 2020-12-23 LAB — BASIC METABOLIC PANEL
Anion gap: 17 — ABNORMAL HIGH (ref 5–15)
BUN: 35 mg/dL — ABNORMAL HIGH (ref 6–20)
CO2: 24 mmol/L (ref 22–32)
Calcium: 8.4 mg/dL — ABNORMAL LOW (ref 8.9–10.3)
Chloride: 97 mmol/L — ABNORMAL LOW (ref 98–111)
Creatinine, Ser: 8.33 mg/dL — ABNORMAL HIGH (ref 0.44–1.00)
GFR, Estimated: 5 mL/min — ABNORMAL LOW (ref >60)
Glucose, Bld: 130 mg/dL — ABNORMAL HIGH (ref 70–99)
Potassium: 4.9 mmol/L (ref 3.5–5.1)
Sodium: 138 mmol/L (ref 135–145)

## 2020-12-23 NOTE — Progress Notes (Signed)
Patient ID: Lori [name], female   DOB: July 19, 1971, 50 y.o.   MRN: [id number]  PROGRESS NOTE    Lori [name]  [id number] DOB: 12-Nov-1971 DOA: 12/20/2020 PCP: Patient, No Pcp Per   Brief Narrative:  50 y.o. female with medical history significant for CAD s/p stent to LAD in 2020, ESRD on HD MWF, hypertension, hypothyroidism, hyperparathyroidism, GERD who was initially admitted to [facility] on 12/14/2020 for a left foot wound.  She was started on antibiotics.  General surgery evaluated the patient and opined that she may ultimately need a left AKA.  Patient wanted to discuss with her vascular surgeon Dr. Delana Meyer before any surgery would be considered.  She was transferred to [facility] for further evaluation.  At [facility], she was found to be incidentally Covid positive on 12/14/2020 but remained asymptomatic and was treated with 3 days of IV remdesivir.  Nephrology has been following for continuation of dialysis.  Wound culture from 12/18/2020 grew Enterococcus and Proteus.  Assessment & Plan:   Left foot wound infection with gangrene/chronic ischemia of the left lower extremity -Dry necrotic/ischemic appearance of left fourth and fifth toes -Transferred from [facility] for vascular surgery evaluation: Status post aortogram and left lower extremity angiogram with angioplasty of left peroneal artery/left SFA and popliteal arteries along with mechanical thrombectomy and stent placement of the left SFA and popliteal artery on 12/22/2020.  Follow further vascular surgery recommendations -Antibiotics switched to Zosyn alone on 12/22/2020 based on wound culture which has grown Enterococcus faecalis and Proteus vulgaris  Leukocytosis -From above.  Labs pending for today.  Monitor.  End-stage renal disease on hemodialysis -Nephrology following.  Continue dialysis as per nephrology schedule  Anemia of chronic disease -From renal failure.  Hemoglobin stable.  COVID-19  positive -Incidentally Covid + 12/14/2020.  She has been asymptomatic.  She states she has been fully vaccinated including booster dose.  She received 3 days IV remdesivir.  Currently no need for further treatment.  Remain on airborne precautions. -Currently on room air.  Paroxysmal atrial fibrillation: -Not on anticoagulation or currently on rate/rhythm controlling agents.    Continue aspirin.  CAD: -Continue aspirin, Coreg and Brilinta. -Not currently on statin  Hypertension: -Monitor blood pressure.  Continue Coreg  Hypothyroidism: -Continue Synthroid   DVT prophylaxis: Heparin Code Status: Full Family Communication: None Disposition Plan: Status is: Inpatient  Remains inpatient appropriate because:Inpatient level of care appropriate due to severity of illness   Dispo: The patient is from: Home              Anticipated d/c is to: Home              Anticipated d/c date is: 2 days              Patient currently is not medically stable to d/c.   Difficult to place patient No   Consultants: Vascular surgery/nephrology  Procedures: aortogram and left lower extremity angiogram with angioplasty of left peroneal artery/left SFA and popliteal arteries along with mechanical thrombectomy and stent placement of the left SFA and popliteal artery  Antimicrobials:  Anti-infectives (From admission, onward)   Start     Dose/Rate Route Frequency Ordered Stop   12/22/20 1400  piperacillin-tazobactam (ZOSYN) IVPB 2.25 g        2.25 g 100 mL/hr over 30 Minutes Intravenous Every 8 hours 12/22/20 1314     12/22/20 1330  ceFAZolin (ANCEF) IVPB 1 g/50 mL premix  Note to Pharmacy: To be given in specials   1 g 100 mL/hr over 30 Minutes Intravenous 60 min pre-op 12/22/20 0052 12/22/20 1607   12/22/20 1257  ceFAZolin (ANCEF) 1-4 GM/50ML-% IVPB       Note to Pharmacy: Corlis Hove   : cabinet override      12/22/20 1257 12/22/20 1610   12/22/20 1200  vancomycin (VANCOCIN) IVPB 1000  mg/200 mL premix  Status:  Discontinued        1,000 mg 200 mL/hr over 60 Minutes Intravenous Every T-Th-Sa (Hemodialysis) 12/22/20 1128 12/22/20 1314   12/22/20 0145  ceFAZolin (ANCEF) IVPB 1 g/50 mL premix  Status:  Discontinued       Note to Pharmacy: To be given in specials   1 g 100 mL/hr over 30 Minutes Intravenous  Once 12/22/20 0048 12/22/20 0050   12/21/20 1200  ceFEPIme (MAXIPIME) 2 g in sodium chloride 0.9 % 100 mL IVPB  Status:  Discontinued        2 g 200 mL/hr over 30 Minutes Intravenous Every M-W-F (Hemodialysis) 12/20/20 2038 12/22/20 1314   12/21/20 1200  vancomycin (VANCOCIN) IVPB 1000 mg/200 mL premix  Status:  Discontinued        1,000 mg 200 mL/hr over 60 Minutes Intravenous Every M-W-F (Hemodialysis) 12/20/20 2038 12/22/20 1128   12/20/20 2200  metroNIDAZOLE (FLAGYL) tablet 500 mg  Status:  Discontinued        500 mg Oral Every 8 hours 12/20/20 2038 12/22/20 1314       Subjective: Patient seen and examined at bedside.  Denies shortness of breath, fever, nausea, or vomiting.   Objective: Vitals:   12/22/20 1725 12/22/20 1833 12/22/20 2136 12/23/20 0553  BP:  109/87 (!) 110/92 106/88  Pulse: 65 81 88 60  Resp: (!) '2 18 20 20  '$ Temp:  97.6 F (36.4 C) 97.6 F (36.4 C) (!) 97.4 F (36.3 C)  TempSrc:  Oral Oral Oral  SpO2: 96% 99% 100% 98%  Weight:    110.8 kg  Height:        Intake/Output Summary (Last 24 hours) at 12/23/2020 0749 Last data filed at 12/22/2020 1718 Gross per 24 hour  Intake 250 ml  Output 318 ml  Net -68 ml   Filed Weights   12/21/20 0112 12/22/20 0427 12/23/20 0553  Weight: 109.2 kg (P) 110 kg 110.8 kg    Examination:  General exam: Currently on room air.  No distress. Respiratory system: Bilateral decreased breath sounds at bases with some crackles, no wheezing  cardiovascular system: S1-S2 heard, rate controlled Gastrointestinal system: Abdomen is nondistended, soft and nontender.  Normal bowel sounds are heard  extremities: No  clubbing.  Trace lower extremity edema present Central nervous system: Alert and awake.  Slow to respond.  No focal neurological deficits.  Moving extremities Skin: Dry necrotic appearance of left fourth and fifth toes with mild surrounding erythema but no drainage. Psychiatry: Flat affect  Data Reviewed: I have personally reviewed following labs and imaging studies  CBC: Recent Labs  Lab 12/18/20 0411 12/19/20 0826 12/20/20 0328 12/21/20 0440 12/22/20 0441  WBC 16.3* 17.3* 15.1* 15.9* 14.0*  NEUTROABS 13.4* 13.1* 10.4* 10.2* 8.8*  HGB 10.6* 12.3 11.7* 9.7* 10.1*  HCT 32.5* 38.2 37.4 30.8* 31.1*  MCV 97.6 99.5 100.3* 99.4 98.1  PLT 157 145* 153 141* [id number]*   Basic Metabolic Panel: Recent Labs  Lab 12/17/20 0506 12/18/20 0411 12/19/20 0826 12/20/20 0328 12/21/20 0440 12/22/20 0441  NA 133* 134*  131* 135 135 135  K 4.3 4.8 5.3* 4.4 4.6 5.3*  CL 95* 95* 96* 95* 97* 97*  CO2 22 21* 18* '24 23 23  '$ GLUCOSE 85 82 89 107* 81 75  BUN 32* 43* 59* 32* 40* 50*  CREATININE 7.51* 9.09* 11.56* 7.49* 9.14* 10.68*  CALCIUM 8.4* 8.5* 8.5* 8.5* 8.8* 8.9  PHOS 5.7* 7.0* 7.5* 4.7*  --  6.0*   GFR: Estimated Creatinine Clearance: 7.5 mL/min (A) (by C-G formula based on SCr of 10.68 mg/dL (H)). Liver Function Tests: Recent Labs  Lab 12/17/20 0506 12/18/20 0411 12/19/20 0826 12/20/20 0328 12/21/20 0440  AST  --   --   --   --  32  ALT  --   --   --   --  27  ALKPHOS  --   --   --   --  56  BILITOT  --   --   --   --  0.7  PROT  --   --   --   --  6.2*  ALBUMIN 3.2* 3.3* 3.2* 3.1* 3.1*   No results for input(s): LIPASE, AMYLASE in the last 168 hours. No results for input(s): AMMONIA in the last 168 hours. Coagulation Profile: No results for input(s): INR, PROTIME in the last 168 hours. Cardiac Enzymes: No results for input(s): CKTOTAL, CKMB, CKMBINDEX, TROPONINI in the last 168 hours. BNP (last 3 results) No results for input(s): PROBNP in the last 8760 hours. HbA1C: No results  for input(s): HGBA1C in the last 72 hours. CBG: No results for input(s): GLUCAP in the last 168 hours. Lipid Profile: No results for input(s): CHOL, HDL, LDLCALC, TRIG, CHOLHDL, LDLDIRECT in the last 72 hours. Thyroid Function Tests: No results for input(s): TSH, T4TOTAL, FREET4, T3FREE, THYROIDAB in the last 72 hours. Anemia Panel: No results for input(s): VITAMINB12, FOLATE, FERRITIN, TIBC, IRON, RETICCTPCT in the last 72 hours. Sepsis Labs: No results for input(s): PROCALCITON, LATICACIDVEN in the last 168 hours.  Recent Results (from the past 240 hour(s))  Blood culture (routine x 2)     Status: None   Collection Time: 12/14/20  2:18 PM   Specimen: Right Antecubital; Blood  Result Value Ref Range Status   Specimen Description RIGHT ANTECUBITAL  Final   Special Requests   Final    BOTTLES DRAWN AEROBIC AND ANAEROBIC Blood Culture results may not be optimal due to an inadequate volume of blood received in culture bottles   Culture   Final    NO GROWTH 5 DAYS Performed at [facility], [address], [place], [place] [address]    Report Status 12/19/2020 FINAL  Final  Resp Panel by RT-PCR (Flu A&B, Covid) Nasopharyngeal Swab     Status: Abnormal   Collection Time: 12/14/20  7:21 PM   Specimen: Nasopharyngeal Swab; Nasopharyngeal(NP) swabs in vial transport medium  Result Value Ref Range Status   SARS Coronavirus 2 by RT PCR POSITIVE (A) NEGATIVE Final    Comment: RESULT CALLED TO, READ BACK BY AND VERIFIED WITH: HARRY,A @ 2016 ON 12/14/20 BY JUW (NOTE) SARS-CoV-2 target nucleic acids are DETECTED.  The SARS-CoV-2 RNA is generally detectable in upper respiratory specimens during the acute phase of infection. Positive results are indicative of the presence of the identified virus, but do not rule out bacterial infection or co-infection with other pathogens not detected by the test. Clinical correlation with patient history and other diagnostic information is necessary to  determine patient infection status. The expected result is  Negative.  Fact Sheet for Patients: EntrepreneurPulse.com.au  Fact Sheet for Healthcare Providers: IncredibleEmployment.be  This test is not yet approved or cleared by the [place] FDA and  has been authorized for detection and/or diagnosis of SARS-CoV-2 by FDA under an Emergency Use Authorization (EUA).  This EUA will remain in effect (meaning this test can be  used) for the duration of  the COVID-19 declaration under Section 564(b)(1) of the Act, 21 U.S.C. section 360bbb-3(b)(1), unless the authorization is terminated or revoked sooner.     Influenza A by PCR NEGATIVE NEGATIVE Final   Influenza B by PCR NEGATIVE NEGATIVE Final    Comment: (NOTE) The Xpert Xpress SARS-CoV-2/FLU/RSV plus assay is intended as an aid in the diagnosis of influenza from Nasopharyngeal swab specimens and should not be used as a sole basis for treatment. Nasal washings and aspirates are unacceptable for Xpert Xpress SARS-CoV-2/FLU/RSV testing.  Fact Sheet for Patients: EntrepreneurPulse.com.au  Fact Sheet for Healthcare Providers: IncredibleEmployment.be  This test is not yet approved or cleared by the [place] FDA and has been authorized for detection and/or diagnosis of SARS-CoV-2 by FDA under an Emergency Use Authorization (EUA). This EUA will remain in effect (meaning this test can be used) for the duration of the COVID-19 declaration under Section 564(b)(1) of the Act, 21 U.S.C. section 360bbb-3(b)(1), unless the authorization is terminated or revoked.  Performed at [facility], [address] [place], [place] [address]   Blood culture (routine x 2)     Status: None   Collection Time: 12/14/20  9:25 PM   Specimen: Neck; Blood  Result Value Ref Range Status   Specimen Description NECK  Final   Special Requests   Final    BOTTLES DRAWN AEROBIC AND  ANAEROBIC Blood Culture adequate volume   Culture   Final    NO GROWTH 5 DAYS Performed at [facility], [address], [place], [place] [address]    Report Status 12/19/2020 FINAL  Final  Aerobic Culture (superficial specimen)     Status: None   Collection Time: 12/18/20  8:38 AM   Specimen: Wound  Result Value Ref Range Status   Specimen Description   Final    WOUND Performed at [facility], [address], [place], [place] [address]    Special Requests   Final    LEFT FOOT Performed at [facility], [address] [place], [place] [address]    Gram Stain   Final    RARE WBC PRESENT, PREDOMINANTLY MONONUCLEAR FEW GRAM POSITIVE COCCI RARE YEAST Performed at [facility], [place] [address], [place], [place] [address]    Culture   Final    MODERATE ENTEROCOCCUS FAECALIS MODERATE PROTEUS VULGARIS    Report Status 12/22/2020 FINAL  Final   Organism ID, Bacteria ENTEROCOCCUS FAECALIS  Final   Organism ID, Bacteria PROTEUS VULGARIS  Final      Susceptibility   Enterococcus faecalis - MIC*    AMPICILLIN <=2 SENSITIVE Sensitive     VANCOMYCIN 2 SENSITIVE Sensitive     GENTAMICIN SYNERGY SENSITIVE Sensitive     * MODERATE ENTEROCOCCUS FAECALIS   Proteus vulgaris - MIC*    AMPICILLIN >=32 RESISTANT Resistant     CEFAZOLIN >=64 RESISTANT Resistant     CEFEPIME <=0.12 SENSITIVE Sensitive     CEFTAZIDIME <=1 SENSITIVE Sensitive     CIPROFLOXACIN <=0.25 SENSITIVE Sensitive     GENTAMICIN <=1 SENSITIVE Sensitive     IMIPENEM 4 SENSITIVE Sensitive  TRIMETH/SULFA <=20 SENSITIVE Sensitive     AMPICILLIN/SULBACTAM 8 SENSITIVE Sensitive     PIP/TAZO <=4 SENSITIVE Sensitive     * MODERATE PROTEUS VULGARIS         Radiology Studies: PERIPHERAL VASCULAR CATHETERIZATION  Result Date: 12/22/2020 See op note       Scheduled Meds: . aspirin EC  81 mg Oral Daily  . carvedilol  3.125 mg Oral BID WC  . Chlorhexidine Gluconate Cloth  6 each Topical Daily  . gabapentin   100 mg Oral QHS  . heparin  5,000 Units Subcutaneous Q8H  . levothyroxine  300 mcg Oral QHS  . pantoprazole  40 mg Oral QHS  . sevelamer carbonate  3,200 mg Oral TID WC  . ticagrelor  90 mg Oral BID   Continuous Infusions: . piperacillin-tazobactam (ZOSYN)  IV 2.25 g (12/23/20 0548)          Aline August, MD [facility] 12/23/2020, 7:49 AM

## 2020-12-23 NOTE — Progress Notes (Signed)
[facility]  ROUNDING NOTE   Subjective:   Hemodialysis treatment yesterday. Tolerated treatment well. UF even.   Angiogram yesterday by Dr. Lucky Cowboy. Balloon angioplasty and thrombectomy, two stents placed.   Patient this morning endorses her foot is warm and not in pain.   Objective:  Vital signs in last 24 hours:  Temp:  [97.4 F (36.3 C)-97.9 F (36.6 C)] 97.9 F (36.6 C) (01/28 1148) Pulse Rate:  [47-143] 90 (01/28 1148) Resp:  [2-24] 18 (01/28 0912) BP: (76-130)/(49-103) 120/77 (01/28 1148) SpO2:  [94 %-100 %] 95 % (01/28 1148) Weight:  [110.8 kg] 110.8 kg (01/28 0553)  Weight change:  Filed Weights   12/21/20 0112 12/22/20 0427 12/23/20 0553  Weight: 109.2 kg (P) 110 kg 110.8 kg    Intake/Output: I/O last 3 completed shifts: In: 250 [I.V.:200; IV Piggyback:50] Out: 318 [Other:318]   Intake/Output this shift:  No intake/output data recorded.  Physical Exam: General: NAD, laying in bed  Head: Normocephalic, atraumatic. Moist oral mucosal membranes  Eyes: Anicteric, PERRL  Neck: Supple, trachea midline  Lungs:  Clear to auscultation  Heart: Regular rate and rhythm  Abdomen:  Soft, nontender, obese  Extremities:  left gangrene of fourth and fifth toe  Neurologic: Nonfocal, moving all four extremities  Skin: No lesions  Access: RIJ permcath       Basic Metabolic Panel: Recent Labs  Lab 12/17/20 0506 12/18/20 0411 12/19/20 0826 12/20/20 0328 12/21/20 0440 12/22/20 0441 12/23/20 0641  NA 133* 134* 131* 135 135 135 138  K 4.3 4.8 5.3* 4.4 4.6 5.3* 4.9  CL 95* 95* 96* 95* 97* 97* 97*  CO2 22 21* 18* '24 23 23 24  '$ GLUCOSE 85 82 89 107* 81 75 130*  BUN 32* 43* 59* 32* 40* 50* 35*  CREATININE 7.51* 9.09* 11.56* 7.49* 9.14* 10.68* 8.33*  CALCIUM 8.4* 8.5* 8.5* 8.5* 8.8* 8.9 8.4*  PHOS 5.7* 7.0* 7.5* 4.7*  --  6.0*  --     Liver Function Tests: Recent Labs  Lab 12/17/20 0506 12/18/20 0411 12/19/20 0826 12/20/20 0328 12/21/20 0440   AST  --   --   --   --  32  ALT  --   --   --   --  27  ALKPHOS  --   --   --   --  56  BILITOT  --   --   --   --  0.7  PROT  --   --   --   --  6.2*  ALBUMIN 3.2* 3.3* 3.2* 3.1* 3.1*   No results for input(s): LIPASE, AMYLASE in the last 168 hours. No results for input(s): AMMONIA in the last 168 hours.  CBC: Recent Labs  Lab 12/19/20 0826 12/20/20 0328 12/21/20 0440 12/22/20 0441 12/23/20 0641  WBC 17.3* 15.1* 15.9* 14.0* 22.7*  NEUTROABS 13.1* 10.4* 10.2* 8.8* 18.8*  HGB 12.3 11.7* 9.7* 10.1* 9.7*  HCT 38.2 37.4 30.8* 31.1* 31.5*  MCV 99.5 100.3* 99.4 98.1 101.6*  PLT 145* 153 141* 147* 165    Cardiac Enzymes: No results for input(s): CKTOTAL, CKMB, CKMBINDEX, TROPONINI in the last 168 hours.  BNP: Invalid input(s): POCBNP  CBG: No results for input(s): GLUCAP in the last 168 hours.  Microbiology: Results for orders placed or performed during the hospital encounter of 12/14/20  Blood culture (routine x 2)     Status: None   Collection Time: 12/14/20  2:18 PM   Specimen: Right Antecubital; Blood  Result Value  Ref Range Status   Specimen Description RIGHT ANTECUBITAL  Final   Special Requests   Final    BOTTLES DRAWN AEROBIC AND ANAEROBIC Blood Culture results may not be optimal due to an inadequate volume of blood received in culture bottles   Culture   Final    NO GROWTH 5 DAYS Performed at [facility], [address], [place], [place] [address]    Report Status 12/19/2020 FINAL  Final  Resp Panel by RT-PCR (Flu A&B, Covid) Nasopharyngeal Swab     Status: Abnormal   Collection Time: 12/14/20  7:21 PM   Specimen: Nasopharyngeal Swab; Nasopharyngeal(NP) swabs in vial transport medium  Result Value Ref Range Status   SARS Coronavirus 2 by RT PCR POSITIVE (A) NEGATIVE Final    Comment: RESULT CALLED TO, READ BACK BY AND VERIFIED WITH: HARRY,A @ 2016 ON 12/14/20 BY JUW (NOTE) SARS-CoV-2 target nucleic acids are DETECTED.  The SARS-CoV-2 RNA is generally  detectable in upper respiratory specimens during the acute phase of infection. Positive results are indicative of the presence of the identified virus, but do not rule out bacterial infection or co-infection with other pathogens not detected by the test. Clinical correlation with patient history and other diagnostic information is necessary to determine patient infection status. The expected result is Negative.  Fact Sheet for Patients: EntrepreneurPulse.com.au  Fact Sheet for Healthcare Providers: IncredibleEmployment.be  This test is not yet approved or cleared by the [place] FDA and  has been authorized for detection and/or diagnosis of SARS-CoV-2 by FDA under an Emergency Use Authorization (EUA).  This EUA will remain in effect (meaning this test can be  used) for the duration of  the COVID-19 declaration under Section 564(b)(1) of the Act, 21 U.S.C. section 360bbb-3(b)(1), unless the authorization is terminated or revoked sooner.     Influenza A by PCR NEGATIVE NEGATIVE Final   Influenza B by PCR NEGATIVE NEGATIVE Final    Comment: (NOTE) The Xpert Xpress SARS-CoV-2/FLU/RSV plus assay is intended as an aid in the diagnosis of influenza from Nasopharyngeal swab specimens and should not be used as a sole basis for treatment. Nasal washings and aspirates are unacceptable for Xpert Xpress SARS-CoV-2/FLU/RSV testing.  Fact Sheet for Patients: EntrepreneurPulse.com.au  Fact Sheet for Healthcare Providers: IncredibleEmployment.be  This test is not yet approved or cleared by the [place] FDA and has been authorized for detection and/or diagnosis of SARS-CoV-2 by FDA under an Emergency Use Authorization (EUA). This EUA will remain in effect (meaning this test can be used) for the duration of the COVID-19 declaration under Section 564(b)(1) of the Act, 21 U.S.C. section 360bbb-3(b)(1), unless the  authorization is terminated or revoked.  Performed at [facility], [address] [place], [place] [address]   Blood culture (routine x 2)     Status: None   Collection Time: 12/14/20  9:25 PM   Specimen: Neck; Blood  Result Value Ref Range Status   Specimen Description NECK  Final   Special Requests   Final    BOTTLES DRAWN AEROBIC AND ANAEROBIC Blood Culture adequate volume   Culture   Final    NO GROWTH 5 DAYS Performed at [facility], [address], [place], [place] [address]    Report Status 12/19/2020 FINAL  Final  Aerobic Culture (superficial specimen)     Status: None   Collection Time: 12/18/20  8:38 AM   Specimen: Wound  Result Value Ref Range Status   Specimen Description   Final  WOUND Performed at [facility], [address], [place], [place] [address]    Special Requests   Final    LEFT FOOT Performed at [facility], [address] [place], [place] [address]    Gram Stain   Final    RARE WBC PRESENT, PREDOMINANTLY MONONUCLEAR FEW GRAM POSITIVE COCCI RARE YEAST Performed at [facility], [place] [address] [place], [place] [address]    Culture   Final    MODERATE ENTEROCOCCUS FAECALIS MODERATE PROTEUS VULGARIS    Report Status 12/22/2020 FINAL  Final   Organism ID, Bacteria ENTEROCOCCUS FAECALIS  Final   Organism ID, Bacteria PROTEUS VULGARIS  Final      Susceptibility   Enterococcus faecalis - MIC*    AMPICILLIN <=2 SENSITIVE Sensitive     VANCOMYCIN 2 SENSITIVE Sensitive     GENTAMICIN SYNERGY SENSITIVE Sensitive     * MODERATE ENTEROCOCCUS FAECALIS   Proteus vulgaris - MIC*    AMPICILLIN >=32 RESISTANT Resistant     CEFAZOLIN >=64 RESISTANT Resistant     CEFEPIME <=0.12 SENSITIVE Sensitive     CEFTAZIDIME <=1 SENSITIVE Sensitive     CIPROFLOXACIN <=0.25 SENSITIVE Sensitive     GENTAMICIN <=1 SENSITIVE Sensitive     IMIPENEM 4 SENSITIVE Sensitive     TRIMETH/SULFA <=20 SENSITIVE Sensitive     AMPICILLIN/SULBACTAM 8 SENSITIVE Sensitive      PIP/TAZO <=4 SENSITIVE Sensitive     * MODERATE PROTEUS VULGARIS    Coagulation Studies: No results for input(s): LABPROT, INR in the last 72 hours.  Urinalysis: No results for input(s): COLORURINE, LABSPEC, PHURINE, GLUCOSEU, HGBUR, BILIRUBINUR, KETONESUR, PROTEINUR, UROBILINOGEN, NITRITE, LEUKOCYTESUR in the last 72 hours.  Invalid input(s): APPERANCEUR    Imaging: PERIPHERAL VASCULAR CATHETERIZATION  Result Date: 12/22/2020 See op note    Medications:   . piperacillin-tazobactam (ZOSYN)  IV 2.25 g (12/23/20 0548)   . aspirin EC  81 mg Oral Daily  . carvedilol  3.125 mg Oral BID WC  . Chlorhexidine Gluconate Cloth  6 each Topical Daily  . gabapentin  100 mg Oral QHS  . heparin  5,000 Units Subcutaneous Q8H  . levothyroxine  300 mcg Oral QHS  . pantoprazole  40 mg Oral QHS  . sevelamer carbonate  3,200 mg Oral TID WC  . ticagrelor  90 mg Oral BID   acetaminophen, albuterol, HYDROcodone-acetaminophen, morphine injection, ondansetron **OR** ondansetron (ZOFRAN) IV, senna-docusate, sevelamer carbonate  Assessment/ Plan:  [name] is a 50 y.o. white female with end stage renal disease on hemodialysis, coronary artery disease, hypertension, peripheral vascular disease, hypothyroidism, who was admitted to [facility] on 12/20/2020 for Cellulitis of fourth toe of left foot [id number]  Patient initially admitted to [facility]. Patient transferred to [facility] for vascular services. Patient with incidental COVID diagnosis.   [facility]) MWF [facility] RIJ permcath 101.5kg  1. End Stage Renal Disease: treatment yesterday, tolerated treatment well. Keep on TTS schedule due to COVID diagnosis.  2. Hypertension: on carvedilol. However patient has a history of requiring midodrine.   3. Anemia of chronic kidney disease: hemoglobin 10.1. EPO held due to ischemia - May resume EPO with next HD treatment  4. Secondary Hyperparathyroidism:  - Sevelamer  with meals  5. Peripheral vascular disease: with ischemic changes. Angiogram on 1/27 by Dr. Lucky Cowboy with balloon angioplasty, thrombectomy and two stents placed to SFA and left popliteal.  - Appreciate vascular input.  - ticagrelor   LOS: 3 Ivey Nembhard 1/28/20221:02 PM

## 2020-12-23 NOTE — Anesthesia Postprocedure Evaluation (Signed)
Anesthesia Post Note  Patient: Lori [name]  Procedure(s) Performed: Lower Extremity Angiography (Left )  Patient location during evaluation: PACU Anesthesia Type: General Level of consciousness: awake and alert Pain management: pain level controlled Vital Signs Assessment: post-procedure vital signs reviewed and stable Respiratory status: spontaneous breathing, nonlabored ventilation and respiratory function stable Cardiovascular status: blood pressure returned to baseline and stable Postop Assessment: no apparent nausea or vomiting Anesthetic complications: no   No complications documented.   Last Vitals:  Vitals:   12/22/20 1833 12/22/20 2136  BP: 109/87 (!) 110/92  Pulse: 81 88  Resp: 18 20  Temp: 36.4 C 36.4 C  SpO2: 99% 100%    Last Pain:  Vitals:   12/22/20 2136  TempSrc: Oral  PainSc: 0-No pain                 Brett Canales [facility] Vert

## 2020-12-23 NOTE — Progress Notes (Signed)
Hemodialysis patient known at [facility] MWF, Due to COVID + patient will treat on Cohort shift MWF 4:30pm. Patient's daughter is aware of this plan. Please contact me with any dialysis placement concerns.  Elvera Bicker Dialysis Coordinator [phone]

## 2020-12-23 NOTE — Consult Note (Signed)
PODIATRY / FOOT AND ANKLE SURGERY CONSULTATION NOTE  Requesting Physician:  Dr. Lucky Cowboy  Reason for consult: Forefoot gangrene bilateral  Chief Complaint: Forefoot gangrene/nonhealing wound   HPI: Lori [name] is a 50 y.o. female who presents with nonhealing wounds to both feet.  Patient has notable necrosis that appears to be dry to the left fourth and fifth toes.  Patient also notes that she had a cut on her right foot at the fifth metatarsal phalangeal joint in which she has a Band-Aid on today.  Patient notes some darkening changes to this area as well.  Patient was seen at [facility] due to worsening necrosis on 12/14/2020.  She was started on antibiotics and general surgery evaluated the patient and stated that she ultimately would likely need a left AKA.  Patient wanted to discuss any intervention with her vascular surgeon Dr. Delana Meyer prior to any other surgery.  Patient was transferred to [facility] for further evaluation and management.  Patient was also found to be Covid positive on 12/14/2020 but was asymptomatic.  Wound culture from 12/18/2020 grew Enterococcus and Proteus.  Revascularization procedure was performed by Dr. Lucky Cowboy and the vascular team yesterday on 12/22/2020.  Podiatry team was consulted for potential amputation of infected/gangrenous digits.  Patient presents today resting in bed comfortably.  Patient denies any pain to both feet at this time.  PMHx:  Past Medical History:  Diagnosis Date  . Anemia   . ASCVD (arteriosclerotic cardiovascular disease)    a. s/p prior stenting of LAD b. NSTEMI in 01/2018 requiring DES x3 to RCA given spiral dissection from ostium to mid-RCA but residual disease along distal RCA, LAD and 2nd Mrg  . Calciphylaxis   . Chronic abdominal wound infection   . Dialysis patient ([place])   . ESRD (end stage renal disease) ([place])    Due to membranous GN dialysis 09/1996; peritoneal dialysis --? peitonitis; difficult vascular access  . GERD  (gastroesophageal reflux disease)   . Hashimoto thyroiditis   . Hyperparathyroidism   . Hypertension   . Hypothyroidism   . Medically noncompliant   . Morbid obesity ([place])     Surgical Hx:  Past Surgical History:  Procedure Laterality Date  . [place]  . CORONARY STENT INTERVENTION N/A 10/21/2019   Procedure: CORONARY STENT INTERVENTION;  Surgeon: Wellington Hampshire, MD;  Location: [place] [facility];  Service: Cardiovascular;  Laterality: N/A;  . LEFT HEART CATH AND CORONARY ANGIOGRAPHY N/A 02/11/2018   Procedure: LEFT HEART CATH AND CORONARY ANGIOGRAPHY;  Surgeon: Charolette Forward, MD;  Location: [place] [facility];  Service: Cardiovascular;  Laterality: N/A;  . LEFT HEART CATH AND CORONARY ANGIOGRAPHY N/A 10/14/2019   Procedure: LEFT HEART CATH AND CORONARY ANGIOGRAPHY;  Surgeon: Wellington Hampshire, MD;  Location: [place] [facility];  Service: Cardiovascular;  Laterality: N/A;  . LOWER EXTREMITY ANGIOGRAPHY Left 12/22/2020   Procedure: Lower Extremity Angiography;  Surgeon: Algernon Huxley, MD;  Location: [place] [facility];  Service: Cardiovascular;  Laterality: Left;  . THYROIDECTOMY, PARTIAL     Resectin of left lobe with reimplantation in the forearm, small focus of papillary carcinoma incidentally noted at pathology - 2000 and Hashimoto's thyrdoiditis in 2001  . TONSILLECTOMY AND ADENOIDECTOMY      FHx:  Family History  Problem Relation Age of Onset  . Coronary artery disease Mother   . Kidney disease Father   . Diabetes Sister     Social History:  reports that  she has quit smoking. She has never used smokeless tobacco. She reports that she does not drink alcohol and does not use drugs.  Allergies:  Allergies  Allergen Reactions  . Activase [Alteplase] Shortness Of Breath  . Bee Pollen Anaphylaxis  . Warfarin Sodium Nausea And Vomiting and Rash    Review of Systems: General ROS: negative Respiratory ROS: no cough, shortness of breath, or wheezing Cardiovascular ROS:  no chest pain or dyspnea on exertion Gastrointestinal ROS: no abdominal pain, change in bowel habits, or black or bloody stools Musculoskeletal ROS: positive for - joint pain, joint stiffness and joint swelling Neurological ROS: positive for - numbness/tingling Dermatological ROS: positive for Gangrenous necrosis right fifth metatarsal phalangeal joint, left fourth and fifth metatarsal phalangeal joints and digits  Medications Prior to Admission  Medication Sig Dispense Refill  . albuterol (PROVENTIL HFA;VENTOLIN HFA) 108 (90 BASE) MCG/ACT inhaler Inhale 2 puffs into the lungs every 6 (six) hours as needed. Shortness of breath    . aspirin EC 81 MG tablet Take 81 mg by mouth daily. Swallow whole.    . b complex-vitamin c-folic acid (NEPHRO-VITE) 0.8 MG TABS tablet Take 1 tablet by mouth daily.    . calcitRIOL (ROCALTROL) 0.25 MCG capsule Take 3 capsules (0.75 mcg total) by mouth every Monday, Wednesday, and Friday with hemodialysis.    Marland Kitchen ceFEPIme 2 g in sodium chloride 0.9 % 100 mL Inject 2 g into the vein every Monday, Wednesday, and Friday with hemodialysis.    Marland Kitchen gabapentin (NEURONTIN) 100 MG capsule Take 1 capsule (100 mg total) by mouth at bedtime.    Marland Kitchen HYDROcodone-acetaminophen (NORCO/VICODIN) 5-325 MG tablet TAKE (1) TABLET BY MOUTH EVERY (6) HOURS AS NEEDED. 18 tablet 0  . levothyroxine (SYNTHROID, LEVOTHROID) 300 MCG tablet Take 300 mcg by mouth at bedtime. SYNTHROID ONLY-BRAND NAME MEDICALLY NECESSARY    . metroNIDAZOLE (FLAGYL) 500 MG tablet Take 1 tablet (500 mg total) by mouth every 8 (eight) hours.    . midodrine (PROAMATINE) 10 MG tablet Take 1 tablet (10 mg total) by mouth 3 (three) times daily with meals.    . nitroGLYCERIN (NITROSTAT) 0.4 MG SL tablet Place 1 tablet (0.4 mg total) under the tongue every 5 (five) minutes x 3 doses as needed for chest pain. 25 tablet 12  . nystatin (MYCOSTATIN/NYSTOP) powder Apply topically 2 (two) times daily. 15 g 0  . pantoprazole (PROTONIX) 40  MG tablet Take 1 tablet (40 mg total) by mouth at bedtime.    . polyethylene glycol (MIRALAX / GLYCOLAX) 17 g packet Take 17 g by mouth 2 (two) times daily. 14 each 0  . sevelamer carbonate (RENVELA) 800 MG tablet Take 1,600-3,200 mg by mouth 3 (three) times daily with meals. '3200mg'$  with meals and '1600mg'$  with snacks    . ticagrelor (BRILINTA) 90 MG TABS tablet Take 1 tablet (90 mg total) by mouth 2 (two) times daily. 60 tablet 3  . vancomycin (VANCOCIN) 1-5 GM/200ML-% SOLN Inject 200 mLs (1,000 mg total) into the vein every Monday, Wednesday, and Friday with hemodialysis. 4000 mL   . EPINEPHrine 0.3 mg/0.3 mL IJ SOAJ injection Inject 0.3 mg into the muscle as needed.    . fentaNYL (SUBLIMAZE) 100 MCG/2ML injection Inject 0.25 mLs (12.5 mcg total) into the vein every 3 (three) hours as needed for severe pain. 2 mL 0    Physical Exam: General: Alert and oriented.  No apparent distress.  Vascular: DP/PT pulses nonpalpable bilateral.  Both feet do appear to be warm  to touch bilateral.  Capillary fill time appears to be delayed to the right fifth ray at the fifth metatarsal phalangeal joint dorsal lateral and to the left fourth and fifth toes.  Neuro: Light touch sensation reduced to both feet.  Derm: Gangrenous necrosis that appears to be dry to the left fourth and fifth toes, no bone exposed at this time, appears to be dry, minimal to no drainage noted, no corresponding cellulitic changes to the area.  Mild odor.  New signs of gangrene present to the right fifth metatarsal phalangeal joint dorsal lateral aspect.  Capillary fill time appears to be delayed to this area, mild serous drainage, no erythema, no edema, no odor.  MSK: Mild pain on palpation to the left and right forefoot.  Results for orders placed or performed during the hospital encounter of 12/20/20 (from the past 48 hour(s))  Basic metabolic panel     Status: Abnormal   Collection Time: 12/22/20  4:41 AM  Result Value Ref Range    Sodium 135 135 - 145 mmol/L   Potassium 5.3 (H) 3.5 - 5.1 mmol/L   Chloride 97 (L) 98 - 111 mmol/L   CO2 23 22 - 32 mmol/L   Glucose, Bld 75 70 - 99 mg/dL    Comment: Glucose reference range applies only to samples taken after fasting for at least 8 hours.   BUN 50 (H) 6 - 20 mg/dL   Creatinine, Ser 10.68 (H) 0.44 - 1.00 mg/dL   Calcium 8.9 8.9 - 10.3 mg/dL   GFR, Estimated 4 (L) >60 mL/min    Comment: (NOTE) Calculated using the CKD-EPI Creatinine Equation (2021)    Anion gap 15 5 - 15    Comment: Performed at [facility], [address] [place], [place] [address]  CBC with Differential/Platelet     Status: Abnormal   Collection Time: 12/22/20  4:41 AM  Result Value Ref Range   WBC 14.0 (H) 4.0 - 10.5 K/uL   RBC 3.17 (L) 3.87 - 5.11 MIL/uL   Hemoglobin 10.1 (L) 12.0 - 15.0 g/dL   HCT 31.1 (L) 36.0 - 46.0 %   MCV 98.1 80.0 - 100.0 fL   MCH 31.9 26.0 - 34.0 pg   MCHC 32.5 30.0 - 36.0 g/dL   RDW 16.4 (H) 11.5 - 15.5 %   Platelets 147 (L) 150 - 400 K/uL   nRBC 0.0 0.0 - 0.2 %   Neutrophils Relative % 63 %   Neutro Abs 8.8 (H) 1.7 - 7.7 K/uL   Lymphocytes Relative 11 %   Lymphs Abs 1.6 0.7 - 4.0 K/uL   Monocytes Relative 9 %   Monocytes Absolute 1.2 (H) 0.1 - 1.0 K/uL   Eosinophils Relative 3 %   Eosinophils Absolute 0.4 0.0 - 0.5 K/uL   Basophils Relative 1 %   Basophils Absolute 0.2 (H) 0.0 - 0.1 K/uL   WBC Morphology MORPHOLOGY UNREMARKABLE     Comment: MILD LEFT SHIFT (1-5% METAS, OCC MYELO, OCC BANDS)   RBC Morphology MORPHOLOGY UNREMARKABLE    Smear Review Normal platelet morphology    Immature Granulocytes 13 %   Abs Immature Granulocytes 1.87 (H) 0.00 - 0.07 K/uL    Comment: Performed at [facility], [address] [place], [place] [address]  Phosphorus     Status: Abnormal   Collection Time: 12/22/20  4:41 AM  Result Value Ref Range   Phosphorus 6.0 (H) 2.5 - 4.6 mg/dL    Comment: Performed at [facility], [place], [place] [address]  CBC with Differential/Platelet     Status: Abnormal   Collection Time: 12/23/20  6:41 AM  Result Value Ref Range   WBC 22.7 (H) 4.0 - 10.5 K/uL   RBC 3.10 (L) 3.87 - 5.11 MIL/uL   Hemoglobin 9.7 (L) 12.0 - 15.0 g/dL   HCT 31.5 (L) 36.0 - 46.0 %   MCV 101.6 (H) 80.0 - 100.0 fL   MCH 31.3 26.0 - 34.0 pg   MCHC 30.8 30.0 - 36.0 g/dL   RDW 16.3 (H) 11.5 - 15.5 %   Platelets 165 150 - 400 K/uL   nRBC 0.0 0.0 - 0.2 %   Neutrophils Relative % 83 %   Neutro Abs 18.8 (H) 1.7 - 7.7 K/uL   Lymphocytes Relative 3 %   Lymphs Abs 0.7 0.7 - 4.0 K/uL   Monocytes Relative 3 %   Monocytes Absolute 0.6 0.1 - 1.0 K/uL   Eosinophils Relative 0 %   Eosinophils Absolute 0.0 0.0 - 0.5 K/uL   Basophils Relative 0 %   Basophils Absolute 0.1 0.0 - 0.1 K/uL   WBC Morphology MILD LEFT SHIFT (1-5% METAS, OCC MYELO, OCC BANDS)    RBC Morphology MORPHOLOGY UNREMARKABLE    Smear Review Normal platelet morphology    Immature Granulocytes 11 %   Abs Immature Granulocytes 2.54 (H) 0.00 - 0.07 K/uL    Comment: Performed at [facility], [address] [place], [place] [id number]  Basic metabolic panel     Status: Abnormal   Collection Time: 12/23/20  6:41 AM  Result Value Ref Range   Sodium 138 135 - 145 mmol/L   Potassium 4.9 3.5 - 5.1 mmol/L   Chloride 97 (L) 98 - 111 mmol/L   CO2 24 22 - 32 mmol/L   Glucose, Bld 130 (H) 70 - 99 mg/dL    Comment: Glucose reference range applies only to samples taken after fasting for at least 8 hours.   BUN 35 (H) 6 - 20 mg/dL   Creatinine, Ser 8.33 (H) 0.44 - 1.00 mg/dL   Calcium 8.4 (L) 8.9 - 10.3 mg/dL   GFR, Estimated 5 (L) >60 mL/min    Comment: (NOTE) Calculated using the CKD-EPI Creatinine Equation (2021)    Anion gap 17 (H) 5 - 15    Comment: Performed at [facility], [place], [place] [address]   PERIPHERAL VASCULAR CATHETERIZATION  Result Date: 12/22/2020 See op note   Blood pressure 120/77, pulse  90, temperature 97.9 F (36.6 C), temperature source Oral, resp. rate 18, height '5\' 3"'$  (1.6 m), weight 110.8 kg, SpO2 95 %.   Assessment 1. Left forefoot dry gangrene involving the fourth and fifth metatarsal phalangeal joints and digits 2. Right fifth metatarsal phalangeal joint dry gangrene 3. PVD  Plan -Patient seen and evaluated. -Patient appears to have stable dry gangrene to the left fourth and fifth toes and metatarsal phalangeal joints. -Patient also apparently has new finding today of gangrenous necrosis which appears to be dry and stable to the right fifth metatarsal phalangeal joint with delayed capillary fill time. -Applied Betadine dressings to both.  Patient should have dressings daily until surgery. -Appreciate vascular recommendations.  Have asked vascular surgery to also assess right lower extremity due to new gangrenous changes to the fifth metatarsal phalangeal joint. -Appreciate medicine recommendations for antibiotic therapy. -Discussed all treatment options with patient both conservative and surgical attempts at correction include potential risks and  complications and at this time patient is elected for surgical procedure consisting of left partial fourth and fifth ray amputations.  Discussed postoperative course in detail.  Discussed complications that can occur based on patient's blood flow and that potentially the incision could necrosis which would require further subsequent surgery.  Patient understands and would like to proceed. -Patient to be n.p.o. at midnight for surgical procedure tomorrow 12/24/2020 at around 9 AM.  Caroline More, DPM 12/23/2020, 3:01 PM

## 2020-12-23 NOTE — Progress Notes (Signed)
[facility] Daily Progress Note   Subjective: Patient without complaint this AM.  No issues overnight.  Objective: Vitals:   12/22/20 2136 12/23/20 0553 12/23/20 0912 12/23/20 1148  BP: (!) 110/92 106/88 (!) 130/103 120/77  Pulse: 88 60 66 90  Resp: '20 20 18   '$ Temp: 97.6 F (36.4 C) (!) 97.4 F (36.3 C) 97.8 F (36.6 C) 97.9 F (36.6 C)  TempSrc: Oral Oral Oral Oral  SpO2: 100% 98% 98% 95%  Weight:  110.8 kg    Height:        Intake/Output Summary (Last 24 hours) at 12/23/2020 1313 Last data filed at 12/23/2020 1147 Gross per 24 hour  Intake 250 ml  Output 0 ml  Net 250 ml   Physical Exam: A&Ox3, NAD CV: RRR Pulmonary: CTA Bilaterally Abdomen: Soft, Nontender, Nondistended Right groin access site: VAD intact.  No drainage or swelling or ecchymosis noted Vascular:  Extremities are warm distally to toes   Laboratory: CBC    Component Value Date/Time   WBC 22.7 (H) 12/23/2020 0641   HGB 9.7 (L) 12/23/2020 0641   HGB 10.0 (L) 03/26/2014 1842   HCT 31.5 (L) 12/23/2020 0641   HCT 32.0 (L) 03/26/2014 1842   PLT 165 12/23/2020 0641   PLT 332 03/26/2014 1425   BMET    Component Value Date/Time   NA 138 12/23/2020 0641   NA 138 03/26/2014 1425   K 4.9 12/23/2020 0641   K 3.5 03/26/2014 1425   CL 97 (L) 12/23/2020 0641   CL 99 03/26/2014 1425   CO2 24 12/23/2020 0641   CO2 26 03/26/2014 1425   GLUCOSE 130 (H) 12/23/2020 0641   GLUCOSE 79 03/26/2014 1425   BUN 35 (H) 12/23/2020 0641   BUN 36 (H) 03/26/2014 1425   CREATININE 8.33 (H) 12/23/2020 0641   CREATININE 9.54 (H) 03/26/2014 1425   CALCIUM 8.4 (L) 12/23/2020 0641   CALCIUM 7.7 (L) 03/26/2014 1425   GFRNONAA 5 (L) 12/23/2020 0641   GFRNONAA 4 (L) 03/26/2014 1425   GFRAA 7 (L) 10/22/2019 0240   GFRAA 5 (L) 03/26/2014 1425   Assessment/Planning: The patient is a 50 year old female with known history of severe atherosclerotic disease to the left lower extremity requiring  endovascular and open interventions status post successful revascularization of the left lower extremity - POD#1  1) s/p successful vascularization of the left lower extremity 2) patient is currently on aspirin.  Would also add statin and Plavix once any need for surgery by podiatry is ruled out. 3) had a long discussion in regard to compliance with the patient in regard to follow-up and medications. 4) we will continue to follow the patient in our clinic in approximately one month  Discussed with Dr. [facility] PA-C 12/23/2020 1:13 PM

## 2020-12-24 ENCOUNTER — Inpatient Hospital Stay: Payer: Medicare Other | Admitting: Anesthesiology

## 2020-12-24 ENCOUNTER — Encounter: Payer: Self-pay | Admitting: Internal Medicine

## 2020-12-24 ENCOUNTER — Encounter
Admission: AD | Disposition: A | Discharge status: Skilled Nursing Facility | Payer: Self-pay | Source: Other Acute Inpatient Hospital | Attending: Internal Medicine

## 2020-12-24 LAB — CBC
HCT: 28.2 % — ABNORMAL LOW (ref 36.0–46.0)
Hemoglobin: 8.9 g/dL — ABNORMAL LOW (ref 12.0–15.0)
MCH: 31.9 pg (ref 26.0–34.0)
MCHC: 31.6 g/dL (ref 30.0–36.0)
MCV: 101.1 fL — ABNORMAL HIGH (ref 80.0–100.0)
Platelets: 161 10*3/uL (ref 150–400)
RBC: 2.79 MIL/uL — ABNORMAL LOW (ref 3.87–5.11)
RDW: 16.2 % — ABNORMAL HIGH (ref 11.5–15.5)
WBC: 14.2 10*3/uL — ABNORMAL HIGH (ref 4.0–10.5)
nRBC: 0 % (ref 0.0–0.2)

## 2020-12-24 LAB — RENAL FUNCTION PANEL
Albumin: 3.2 g/dL — ABNORMAL LOW (ref 3.5–5.0)
Anion gap: 16 — ABNORMAL HIGH (ref 5–15)
BUN: 50 mg/dL — ABNORMAL HIGH (ref 6–20)
CO2: 25 mmol/L (ref 22–32)
Calcium: 8.7 mg/dL — ABNORMAL LOW (ref 8.9–10.3)
Chloride: 97 mmol/L — ABNORMAL LOW (ref 98–111)
Creatinine, Ser: 9.75 mg/dL — ABNORMAL HIGH (ref 0.44–1.00)
GFR, Estimated: 4 mL/min — ABNORMAL LOW (ref >60)
Glucose, Bld: 87 mg/dL (ref 70–99)
Phosphorus: 6.1 mg/dL — ABNORMAL HIGH (ref 2.5–4.6)
Potassium: 4.9 mmol/L (ref 3.5–5.1)
Sodium: 138 mmol/L (ref 135–145)

## 2020-12-24 SURGERY — CANCELLED PROCEDURE
Anesthesia: General | Laterality: Left

## 2020-12-24 MED ORDER — FENTANYL CITRATE (PF) 100 MCG/2ML IJ SOLN
INTRAMUSCULAR | Status: AC
  Filled 2020-12-24: qty 2

## 2020-12-24 MED ORDER — PHENYLEPHRINE HCL (PRESSORS) 10 MG/ML IV SOLN
INTRAVENOUS | Status: DC | PRN
  Administered 2020-12-24: 300 ug via INTRAVENOUS
  Administered 2020-12-24: 200 ug via INTRAVENOUS

## 2020-12-24 MED ORDER — PROPOFOL 10 MG/ML IV BOLUS
INTRAVENOUS | Status: DC | PRN
  Administered 2020-12-24: 100 mg via INTRAVENOUS

## 2020-12-24 MED ORDER — LIDOCAINE HCL (CARDIAC) PF 100 MG/5ML IV SOSY
PREFILLED_SYRINGE | INTRAVENOUS | Status: DC | PRN
  Administered 2020-12-24: 60 mg via INTRAVENOUS

## 2020-12-24 MED ORDER — CHLORHEXIDINE GLUCONATE 4 % EX LIQD: 60.0000 mL | Freq: Once | CUTANEOUS | Status: DC

## 2020-12-24 MED ORDER — EPOETIN ALFA 10000 UNIT/ML IJ SOLN
8000.0000 [IU] | INTRAMUSCULAR | Status: DC
  Administered 2020-12-27: 10000 [IU] via INTRAVENOUS
  Administered 2020-12-29 – 2021-01-06 (×3): 8000 [IU] via INTRAVENOUS
  Filled 2020-12-24 (×6): qty 1

## 2020-12-24 MED ORDER — FENTANYL CITRATE (PF) 100 MCG/2ML IJ SOLN
INTRAMUSCULAR | Status: DC | PRN
  Administered 2020-12-24: 25 ug via INTRAVENOUS
  Administered 2020-12-24: 50 ug via INTRAVENOUS

## 2020-12-24 MED ORDER — VASOPRESSIN 20 UNIT/ML IV SOLN
INTRAVENOUS | Status: DC | PRN
  Administered 2020-12-24 (×3): 1 [IU] via INTRAVENOUS
  Administered 2020-12-24: 2 [IU] via INTRAVENOUS

## 2020-12-24 MED ORDER — ESMOLOL HCL 100 MG/10ML IV SOLN
INTRAVENOUS | Status: DC | PRN
  Administered 2020-12-24: 30 mg via INTRAVENOUS

## 2020-12-24 MED ORDER — MIDAZOLAM HCL 2 MG/2ML IJ SOLN
INTRAMUSCULAR | Status: AC
  Filled 2020-12-24: qty 2

## 2020-12-24 MED ORDER — PROPOFOL 10 MG/ML IV BOLUS
INTRAVENOUS | Status: AC
  Filled 2020-12-24: qty 20

## 2020-12-24 MED ORDER — LIDOCAINE HCL (PF) 2 % IJ SOLN
INTRAMUSCULAR | Status: AC
  Filled 2020-12-24: qty 5

## 2020-12-24 MED ORDER — SODIUM CHLORIDE 0.9 % IV SOLN: INTRAVENOUS | Status: DC | PRN

## 2020-12-24 MED ORDER — POVIDONE-IODINE 10 % EX SWAB: 2.0000 "application " | Freq: Once | CUTANEOUS | Status: DC

## 2020-12-24 SURGICAL SUPPLY — 48 items
BLADE MED AGGRESSIVE (BLADE) IMPLANT
BLADE OSC/SAGITTAL MD 5.5X18 (BLADE) IMPLANT
BLADE SURG 15 STRL LF DISP TIS (BLADE) IMPLANT
BLADE SURG 15 STRL SS (BLADE)
BLADE SURG MINI STRL (BLADE) IMPLANT
BNDG CONFORM 2 STRL LF (GAUZE/BANDAGES/DRESSINGS) IMPLANT
BNDG ELASTIC 4X5.8 VLCR STR LF (GAUZE/BANDAGES/DRESSINGS) IMPLANT
BNDG ESMARK 4X12 TAN STRL LF (GAUZE/BANDAGES/DRESSINGS) IMPLANT
BNDG GAUZE 4.5X4.1 6PLY STRL (MISCELLANEOUS) IMPLANT
CANISTER SUCT 1200ML W/VALVE (MISCELLANEOUS) IMPLANT
CNTNR SPEC 2.5X3XGRAD LEK (MISCELLANEOUS)
CONT SPEC 4OZ STER OR WHT (MISCELLANEOUS)
CONT SPEC 4OZ STRL OR WHT (MISCELLANEOUS)
CONTAINER SPEC 2.5X3XGRAD LEK (MISCELLANEOUS) IMPLANT
COVER WAND RF STERILE (DRAPES) IMPLANT
CUFF TOURN DUAL PL 12 NO SLV (MISCELLANEOUS) IMPLANT
CUFF TOURN SGL QUICK 18X4 (TOURNIQUET CUFF) IMPLANT
DRAPE FLUOR MINI C-ARM 54X84 (DRAPES) IMPLANT
DURAPREP 26ML APPLICATOR (WOUND CARE) IMPLANT
ELECT REM PT RETURN 9FT ADLT (ELECTROSURGICAL)
ELECTRODE REM PT RTRN 9FT ADLT (ELECTROSURGICAL) IMPLANT
GAUZE SPONGE 4X4 12PLY STRL (GAUZE/BANDAGES/DRESSINGS) IMPLANT
GAUZE XEROFORM 1X8 LF (GAUZE/BANDAGES/DRESSINGS) IMPLANT
GLOVE INDICATOR 7.0 STRL GRN (GLOVE) IMPLANT
GLOVE SURG ENC MOIS LTX SZ7 (GLOVE) IMPLANT
GOWN STRL REUS W/ TWL LRG LVL3 (GOWN DISPOSABLE) IMPLANT
GOWN STRL REUS W/TWL LRG LVL3 (GOWN DISPOSABLE)
HANDPIECE VERSAJET DEBRIDEMENT (MISCELLANEOUS) IMPLANT
KIT TURNOVER KIT A (KITS) IMPLANT
LABEL OR SOLS (LABEL) IMPLANT
MANIFOLD NEPTUNE II (INSTRUMENTS) IMPLANT
NEEDLE FILTER BLUNT 18X 1/2SAF (NEEDLE)
NEEDLE FILTER BLUNT 18X1 1/2 (NEEDLE) IMPLANT
NEEDLE HYPO 25X1 1.5 SAFETY (NEEDLE) IMPLANT
NS IRRIG 500ML POUR BTL (IV SOLUTION) IMPLANT
PACK EXTREMITY ARMC (MISCELLANEOUS) IMPLANT
SOL .9 NS 3000ML IRR  AL (IV SOLUTION)
SOL .9 NS 3000ML IRR AL (IV SOLUTION)
SOL .9 NS 3000ML IRR UROMATIC (IV SOLUTION) IMPLANT
SOL PREP PVP 2OZ (MISCELLANEOUS)
SOLUTION PREP PVP 2OZ (MISCELLANEOUS) IMPLANT
STOCKINETTE STRL 6IN 960660 (GAUZE/BANDAGES/DRESSINGS) IMPLANT
SUT ETHILON 3-0 FS-10 30 BLK (SUTURE)
SUT VIC AB 3-0 SH 27 (SUTURE)
SUT VIC AB 3-0 SH 27X BRD (SUTURE) IMPLANT
SUTURE EHLN 3-0 FS-10 30 BLK (SUTURE) IMPLANT
SWAB CULTURE AMIES ANAERIB BLU (MISCELLANEOUS) IMPLANT
SYR 10ML LL (SYRINGE) IMPLANT

## 2020-12-24 NOTE — Progress Notes (Signed)
Case aborted today.  Patient unstable after anesthesia.  Delay surgery until after patient stabilizes further.  Will reassess on Monday.  Pt also needs work up by vascular for R 5th MTPJ gangrene.  If surgery needed, would prefer to wait until both sides have demarcated and after revasc on both due to instability of patient under anesthesia.  Plan for potential surgery at some point next week.  For now, betadine wet to dry dressings on both feet daily.

## 2020-12-24 NOTE — Progress Notes (Signed)
Lori [name]  MRN: [id number]  DOB/AGE: 04-Jul-1971 50 y.o.  Primary Care Physician:Patient, No Pcp Per  Admit date: 12/20/2020  Chief Complaint: No chief complaint on file.   S-Pt presented on  12/20/2020 with No chief complaint on file. .    Pt today feels better  Medications . aspirin EC  81 mg Oral Daily  . carvedilol  3.125 mg Oral BID WC  . chlorhexidine  60 mL Topical Once  . Chlorhexidine Gluconate Cloth  6 each Topical Daily  . gabapentin  100 mg Oral QHS  . heparin  5,000 Units Subcutaneous Q8H  . levothyroxine  300 mcg Oral QHS  . pantoprazole  40 mg Oral QHS  . povidone-iodine  2 application Topical Once  . sevelamer carbonate  3,200 mg Oral TID WC  . ticagrelor  90 mg Oral BID         [id number] from the symptoms mentioned above,there are no other symptoms referable to all systems reviewed.  Physical Exam: Vital signs in last 24 hours: Temp:  [97.4 F (36.3 C)-98.6 F (37 C)] 97.6 F (36.4 C) (01/29 0927) Pulse Rate:  [74-94] 74 (01/29 0927) Resp:  [14-20] 14 (01/29 0927) BP: (100-140)/(55-125) 123/79 (01/29 0927) SpO2:  [95 %-100 %] 99 % (01/29 0927) Weight change:  Last BM Date: 12/18/20  Intake/Output from previous day: 01/28 0701 - 01/29 0700 In: 360 [P.O.:360] Out: 0  No intake/output data recorded.   Physical Exam:  General- pt is awake,alert, oriented to time place and person  Resp- No acute REsp distress, CTA B/L NO Rhonchi  CVS- S1S2 regular in rate and rhythm  GIT- BS+, soft, Non tender , Non distended  EXT- No LE Edema,  No Cyanosis Bilaterally feet in  bandages   Access-  tunneled cath    Lab Results:  CBC  Recent Labs    12/23/20 0641 12/24/20 0820  WBC 22.7* 14.2*  HGB 9.7* 8.9*  HCT 31.5* 28.2*  PLT 165 161    BMET  Recent Labs    12/23/20 0641 12/24/20 0820  NA 138 138  K 4.9 4.9  CL 97* 97*  CO2 24 25  GLUCOSE 130* 87  BUN 35* 50*  CREATININE 8.33* 9.75*  CALCIUM 8.4* 8.7*      Most  recent Creatinine trend  Lab Results  Component Value Date   CREATININE 9.75 (H) 12/24/2020   CREATININE 8.33 (H) 12/23/2020   CREATININE 10.68 (H) 12/22/2020      MICRO   Recent Results (from the past 240 hour(s))  Blood culture (routine x 2)     Status: None   Collection Time: 12/14/20  2:18 PM   Specimen: Right Antecubital; Blood  Result Value Ref Range Status   Specimen Description RIGHT ANTECUBITAL  Final   Special Requests   Final    BOTTLES DRAWN AEROBIC AND ANAEROBIC Blood Culture results may not be optimal due to an inadequate volume of blood received in culture bottles   Culture   Final    NO GROWTH 5 DAYS Performed at [facility] [address] [place], [place] [address]    Report Status 12/19/2020 FINAL  Final  Resp Panel by RT-PCR (Flu A&B, Covid) Nasopharyngeal Swab     Status: Abnormal   Collection Time: 12/14/20  7:21 PM   Specimen: Nasopharyngeal Swab; Nasopharyngeal(NP) swabs in vial transport medium  Result Value Ref Range Status   SARS Coronavirus 2 by RT PCR POSITIVE (A) NEGATIVE Final    Comment:  RESULT CALLED TO, READ BACK BY AND VERIFIED WITH: HARRY,A @ 2016 ON 12/14/20 BY JUW (NOTE) SARS-CoV-2 target nucleic acids are DETECTED.  The SARS-CoV-2 RNA is generally detectable in upper respiratory specimens during the acute phase of infection. Positive results are indicative of the presence of the identified virus, but do not rule out bacterial infection or co-infection with other pathogens not detected by the test. Clinical correlation with patient history and other diagnostic information is necessary to determine patient infection status. The expected result is Negative.  Fact Sheet for Patients: EntrepreneurPulse.com.au  Fact Sheet for Healthcare Providers: IncredibleEmployment.be  This test is not yet approved or cleared by the [place] FDA and  has been authorized for detection and/or diagnosis of  SARS-CoV-2 by FDA under an Emergency Use Authorization (EUA).  This EUA will remain in effect (meaning this test can be  used) for the duration of  the COVID-19 declaration under Section 564(b)(1) of the Act, 21 U.S.C. section 360bbb-3(b)(1), unless the authorization is terminated or revoked sooner.     Influenza A by PCR NEGATIVE NEGATIVE Final   Influenza B by PCR NEGATIVE NEGATIVE Final    Comment: (NOTE) The Xpert Xpress SARS-CoV-2/FLU/RSV plus assay is intended as an aid in the diagnosis of influenza from Nasopharyngeal swab specimens and should not be used as a sole basis for treatment. Nasal washings and aspirates are unacceptable for Xpert Xpress SARS-CoV-2/FLU/RSV testing.  Fact Sheet for Patients: EntrepreneurPulse.com.au  Fact Sheet for Healthcare Providers: IncredibleEmployment.be  This test is not yet approved or cleared by the [place] FDA and has been authorized for detection and/or diagnosis of SARS-CoV-2 by FDA under an Emergency Use Authorization (EUA). This EUA will remain in effect (meaning this test can be used) for the duration of the COVID-19 declaration under Section 564(b)(1) of the Act, 21 U.S.C. section 360bbb-3(b)(1), unless the authorization is terminated or revoked.  Performed at [facility], [address] [place], [place] [address]   Blood culture (routine x 2)     Status: None   Collection Time: 12/14/20  9:25 PM   Specimen: Neck; Blood  Result Value Ref Range Status   Specimen Description NECK  Final   Special Requests   Final    BOTTLES DRAWN AEROBIC AND ANAEROBIC Blood Culture adequate volume   Culture   Final    NO GROWTH 5 DAYS Performed at [facility], [address], [place], [place] [address]    Report Status 12/19/2020 FINAL  Final  Aerobic Culture (superficial specimen)     Status: None   Collection Time: 12/18/20  8:38 AM   Specimen: Wound  Result Value Ref Range Status   Specimen  Description   Final    WOUND Performed at [facility], [address] [place], [place] [address]    Special Requests   Final    LEFT FOOT Performed at [facility], [address], [place], [place] [address]    Gram Stain   Final    RARE WBC PRESENT, PREDOMINANTLY MONONUCLEAR FEW GRAM POSITIVE COCCI RARE YEAST Performed at [facility], [place] [address] [place],  [address]    Culture   Final    MODERATE ENTEROCOCCUS FAECALIS MODERATE PROTEUS VULGARIS    Report Status 12/22/2020 FINAL  Final   Organism ID, Bacteria ENTEROCOCCUS FAECALIS  Final   Organism ID, Bacteria PROTEUS VULGARIS  Final      Susceptibility   Enterococcus faecalis - MIC*    AMPICILLIN <=2 SENSITIVE Sensitive  VANCOMYCIN 2 SENSITIVE Sensitive     GENTAMICIN SYNERGY SENSITIVE Sensitive     * MODERATE ENTEROCOCCUS FAECALIS   Proteus vulgaris - MIC*    AMPICILLIN >=32 RESISTANT Resistant     CEFAZOLIN >=64 RESISTANT Resistant     CEFEPIME <=0.12 SENSITIVE Sensitive     CEFTAZIDIME <=1 SENSITIVE Sensitive     CIPROFLOXACIN <=0.25 SENSITIVE Sensitive     GENTAMICIN <=1 SENSITIVE Sensitive     IMIPENEM 4 SENSITIVE Sensitive     TRIMETH/SULFA <=20 SENSITIVE Sensitive     AMPICILLIN/SULBACTAM 8 SENSITIVE Sensitive     PIP/TAZO <=4 SENSITIVE Sensitive     * MODERATE PROTEUS VULGARIS         Impression:  Ms. MARAE BLAN is a 50 y.o. white female with end stage renal disease on hemodialysis, coronary artery disease, hypertension, peripheral vascular disease, hypothyroidism,who was admitted to [facility] on1/25/2022for Cellulitis of fourth toe of left foot [L03.032]  Patient initially admitted to [facility]. Patient transferred to [facility] for vascular services. Patient with incidental COVID diagnosis.   [facility] MWF [facility] R[facility] permcath 101.5kg   1)Renal   ESRD Patient is on hemodialysis Patient is on Monday Wednesday Friday schedule as an outpatient As an  inpatient patient is on Tuesday Thursday Saturday schedule secondary to Covid/staffing/inpatient status Patient will be dialyzed today   2)HTN  Blood pressure is stable    3)Anemia of chronic disease  CBC Latest Ref Rng & Units 12/24/2020 12/23/2020 12/22/2020  WBC 4.0 - 10.5 K/uL 14.2(H) 22.7(H) 14.0(H)  Hemoglobin 12.0 - 15.0 g/dL 8.9(L) 9.7(L) 10.1(L)  Hematocrit 36.0 - 46.0 % 28.2(L) 31.5(L) 31.1(L)  Platelets 150 - 400 K/uL 161 165 147(L)       HGb is just below the goal (9--11) We will keep patient is on Epogen protocol  4) Secondary hyperparathyroidism -CKD Mineral-Bone Disorder    Lab Results  Component Value Date   PTH 312.5 (H) 03/27/2014   CALCIUM 8.7 (L) 12/24/2020   CAION 0.80 (L) 03/27/2014   PHOS 6.1 (H) 12/24/2020    Secondary Hyperparathyroidism present Phosphorus is not at goal. Patient is on binders  5) COVID-19 Primary team is following   6) Electrolytes   BMP Latest Ref Rng & Units 12/24/2020 12/23/2020 12/22/2020  Glucose 70 - 99 mg/dL 87 130(H) 75  BUN 6 - 20 mg/dL 50(H) 35(H) 50(H)  Creatinine 0.44 - 1.00 mg/dL 9.75(H) 8.33(H) 10.68(H)  Sodium 135 - 145 mmol/L 138 138 135  Potassium 3.5 - 5.1 mmol/L 4.9 4.9 5.3(H)  Chloride 98 - 111 mmol/L 97(L) 97(L) 97(L)  CO2 22 - 32 mmol/L '25 24 23  '$ Calcium 8.9 - 10.3 mg/dL 8.7(L) 8.4(L) 8.9     Sodium Normonatremic   Potassium Normokalemic    7)Acid base    Co2 at goal  8) peripheral vascular disease Patient is s/p angioplasty Vascular surgeon and primary team is  following   Plan:   We will dialyze patient today     Amariyon Maynes s Theador Hawthorne 12/24/2020, 9:31 AM

## 2020-12-24 NOTE — Anesthesia Postprocedure Evaluation (Signed)
Anesthesia Post Note  Patient: Lori [name]  Procedure(s) Performed: AMPUTATION RAY-Left Foot Partial 4th and 5th ABORTED (Left Foot)  Patient location: OR: COVID Protocol. Anesthesia Type: General Level of consciousness: awake and alert Pain management: pain level controlled Vital Signs Assessment: post-procedure vital signs reviewed and stable Respiratory status: spontaneous breathing, nonlabored ventilation, respiratory function stable and patient connected to nasal cannula oxygen Cardiovascular status: blood pressure returned to baseline and stable Postop Assessment: no apparent nausea or vomiting   No complications documented.   Last Vitals:  Vitals:   12/24/20 0927 12/24/20 1316  BP: 123/79 100/86  Pulse: 74 (!) 107  Resp: 14 18  Temp: 36.4 C   SpO2: 99% 97%    Last Pain:  Vitals:   12/24/20 0927  TempSrc: Oral  PainSc:                  [name]

## 2020-12-24 NOTE — Plan of Care (Signed)
Continuing with plan of care. 

## 2020-12-24 NOTE — Transfer of Care (Signed)
Immediate Anesthesia Transfer of Care Note  Patient: Lori [name]  Procedure(s) Performed: AMPUTATION RAY-Left Foot Partial 4th and 5th. (Left )  Patient Location: Floor  Anesthesia Type:General  Level of Consciousness: awake, alert  and oriented  Airway & Oxygen Therapy: Patient Spontanous Breathing and Patient connected to nasal cannula oxygen  Post-op Assessment: Report given to RN and Post -op Vital signs reviewed and stable  Post vital signs: Reviewed and stable  Last Vitals:  Vitals Value Taken Time  BP    Temp    Pulse    Resp    SpO2      Last Pain:  Vitals:   12/24/20 0927  TempSrc: Oral  PainSc:       Patients Stated Pain Goal: 1 ([id number])  Complications: No complications documented.

## 2020-12-24 NOTE — Anesthesia Procedure Notes (Signed)
Procedure Name: LMA Insertion Date/Time: 12/24/2020 12:40 PM Performed by: Chanetta Marshall, CRNA Pre-anesthesia Checklist: Patient identified, Emergency Drugs available, Suction available and Patient being monitored Patient Re-evaluated:Patient Re-evaluated prior to induction Oxygen Delivery Method: Circle system utilized Preoxygenation: Pre-oxygenation with 100% oxygen Induction Type: IV induction LMA: LMA inserted LMA Size: 4.0 Number of attempts: 1 Placement Confirmation: positive ETCO2,  breath sounds checked- equal and bilateral and CO2 detector Tube secured with: Tape Dental Injury: Teeth and Oropharynx as per pre-operative assessment

## 2020-12-24 NOTE — Anesthesia Preprocedure Evaluation (Signed)
Anesthesia Evaluation  Patient identified by MRN, date of birth, ID band Patient awake    Reviewed: Allergy & Precautions, H&P , NPO status , Patient's Chart, lab work & pertinent test results, reviewed documented beta blocker date and time   History of Anesthesia Complications Negative for: history of anesthetic complications  Airway Mallampati: III  TM Distance: >3 FB Neck ROM: full    Dental  (+) Dental Advidsory Given, Poor Dentition, Missing   Pulmonary shortness of breath and with exertion, asthma , neg sleep apnea, neg COPD, Recent URI  (Covid positive and is s/p treatment), former smoker,    Pulmonary exam normal        Cardiovascular Exercise Tolerance: Good hypertension, (-) angina+ CAD, + Past MI, + Cardiac Stents and + Peripheral Vascular Disease  (-) CABG Normal cardiovascular exam+ dysrhythmias Atrial Fibrillation (-) Valvular Problems/Murmurs     Neuro/Psych negative neurological ROS  negative psych ROS   GI/Hepatic Neg liver ROS, GERD  ,  Endo/Other  neg diabetesHypothyroidism Morbid obesity  Renal/GU ESRF and DialysisRenal disease  negative genitourinary   Musculoskeletal   Abdominal   Peds  Hematology negative hematology ROS (+)   Anesthesia Other Findings COVID+  Past Medical History: No date: Anemia No date: ASCVD (arteriosclerotic cardiovascular disease)     Comment:  a. s/p prior stenting of LAD b. 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 peitonitis; difficult vascular access No date: GERD (gastroesophageal reflux  disease) No date: Hashimoto thyroiditis No date: Hyperparathyroidism No date: Hypertension No date: Hypothyroidism No date: Medically noncompliant No date: Morbid obesity ([place])   Reproductive/Obstetrics negative OB ROS                             Anesthesia Physical  Anesthesia Plan  ASA: IV  Anesthesia Plan: General   Post-op Pain Management:    Induction: Intravenous  PONV Risk Score and Plan: 3 and Ondansetron, Treatment may vary due to age or medical condition and Midazolam  Airway Management Planned: LMA  Additional Equipment:   Intra-op Plan:   Post-operative Plan: Extubation in OR  Informed Consent: I have reviewed the patients History and Physical, chart, labs and discussed the procedure including the risks, benefits and alternatives for the proposed anesthesia with the patient or authorized representative who has indicated his/her understanding and acceptance.     Dental Advisory Given  Plan Discussed with: Anesthesiologist, CRNA and Surgeon  Anesthesia Plan Comments: (Patient consented for risks of anesthesia including but not limited to:  - adverse reactions to medications - damage to eyes, teeth, lips or other oral mucosa - nerve damage due to positioning  - sore throat or hoarseness - Damage to heart, brain, nerves, lungs, other parts of body or loss of life  Patient voiced understanding.)  Anesthesia Quick Evaluation  

## 2020-12-24 NOTE — Progress Notes (Signed)
Patient ID: Lori [name], female   DOB: [birth year], 50 y.o.   MRN: [id number]  PROGRESS NOTE    Lori [name]  [id number] DOB: 1971-02-02 DOA: 12/20/2020 PCP: Patient, No Pcp Per   Brief Narrative:  50 y.o. female with medical history significant for CAD s/p stent to LAD in 2020, ESRD on HD MWF, hypertension, hypothyroidism, hyperparathyroidism, GERD who was initially admitted to [facility] on 12/14/2020 for a left foot wound.  She was started on antibiotics.  General surgery evaluated the patient and opined that she may ultimately need a left AKA.  Patient wanted to discuss with her vascular surgeon Dr. Delana Meyer before Lori surgery would be considered.  She was transferred to [facility] for further evaluation.  At [facility], she was found to be incidentally Covid positive on 12/14/2020 but remained asymptomatic and was treated with 3 days of IV remdesivir.  Nephrology has been following for continuation of dialysis.  Wound culture from 12/18/2020 grew Enterococcus and Proteus.  Assessment & Plan:   Left foot wound infection with gangrene/chronic ischemia of the left lower extremity -Dry necrotic/ischemic appearance of left fourth and fifth toes -Transferred from [facility] for vascular surgery evaluation: Status post aortogram and left lower extremity angiogram with angioplasty of left peroneal artery/left SFA and popliteal arteries along with mechanical thrombectomy and stent placement of the left SFA and popliteal artery on 12/22/2020.  Vascular surgery now recommending outpatient follow-up.  Will possibly add statin on discharge -Antibiotics switched to Zosyn alone on 12/22/2020 based on wound culture which has grown Enterococcus faecalis and Proteus vulgaris -Podiatry consulted by vascular surgery on 12/15/2020 who plans to do surgical intervention today.  Leukocytosis -From above.  Labs pending for today.  Monitor.  End-stage renal disease on hemodialysis -Nephrology following.   Continue dialysis as per nephrology schedule  Anemia of chronic disease -From renal failure.  Hemoglobin stable.  COVID-19 positive -Incidentally Covid + 12/14/2020.  She has been asymptomatic.  She states she has been fully vaccinated including booster dose.  She received 3 days IV remdesivir.  Currently no need for further treatment.  -Still on room air.  Paroxysmal atrial fibrillation: -Not on anticoagulation or currently on rate/rhythm controlling agents.    Continue aspirin.  CAD: -Continue aspirin, Coreg and Brilinta. -Not currently on statin  Hypertension: -Monitor blood pressure.  Continue Coreg  Hypothyroidism: -Continue Synthroid   DVT prophylaxis: Heparin Code Status: Full Family Communication: None Disposition Plan: Status is: Inpatient  Remains inpatient appropriate because:Inpatient level of care appropriate due to severity of illness   Dispo: The patient is from: Home              Anticipated d/c is to: Home              Anticipated d/c date is: 2 days              Patient currently is not medically stable to d/c.   Difficult to place patient No   Consultants: Vascular surgery/nephrology/podiatry  Procedures: aortogram and left lower extremity angiogram with angioplasty of left peroneal artery/left SFA and popliteal arteries along with mechanical thrombectomy and stent placement of the left SFA and popliteal artery on 12/22/2020  Antimicrobials:  Anti-infectives (From admission, onward)   Start     Dose/Rate Route Frequency Ordered Stop   12/22/20 1400  piperacillin-tazobactam (ZOSYN) IVPB 2.25 g        2.25 g 100 mL/hr over 30 Minutes Intravenous Every 8 hours 12/22/20 1314  12/22/20 1330  ceFAZolin (ANCEF) IVPB 1 g/50 mL premix       Note to Pharmacy: To be given in specials   1 g 100 mL/hr over 30 Minutes Intravenous 60 min pre-op 12/22/20 0052 12/22/20 1607   12/22/20 1257  ceFAZolin (ANCEF) 1-4 GM/50ML-% IVPB       Note to Pharmacy: Corlis Hove   : cabinet override      12/22/20 1257 12/22/20 1610   12/22/20 1200  vancomycin (VANCOCIN) IVPB 1000 mg/200 mL premix  Status:  Discontinued        1,000 mg 200 mL/hr over 60 Minutes Intravenous Every T-Th-Sa (Hemodialysis) 12/22/20 1128 12/22/20 1314   12/22/20 0145  ceFAZolin (ANCEF) IVPB 1 g/50 mL premix  Status:  Discontinued       Note to Pharmacy: To be given in specials   1 g 100 mL/hr over 30 Minutes Intravenous  Once 12/22/20 0048 12/22/20 0050   12/21/20 1200  ceFEPIme (MAXIPIME) 2 g in sodium chloride 0.9 % 100 mL IVPB  Status:  Discontinued        2 g 200 mL/hr over 30 Minutes Intravenous Every M-W-F (Hemodialysis) 12/20/20 2038 12/22/20 1314   12/21/20 1200  vancomycin (VANCOCIN) IVPB 1000 mg/200 mL premix  Status:  Discontinued        1,000 mg 200 mL/hr over 60 Minutes Intravenous Every M-W-F (Hemodialysis) 12/20/20 2038 12/22/20 1128   12/20/20 2200  metroNIDAZOLE (FLAGYL) tablet 500 mg  Status:  Discontinued        500 mg Oral Every 8 hours 12/20/20 2038 12/22/20 1314       Subjective: Patient seen and examined at bedside.  She denies worsening lower extremity pain, fever, nausea, vomiting or shortness of breath.   Objective: Vitals:   12/23/20 1936 12/23/20 1949 12/23/20 2239 12/24/20 0439  BP: 104/89  100/74 (!) 121/55  Pulse: 94 76 84 76  Resp: '20  20 16  '$ Temp: 98.6 F (37 C)  98 F (36.7 C) (!) 97.4 F (36.3 C)  TempSrc:   Oral Oral  SpO2:  100% 100% 96%  Weight:      Height:        Intake/Output Summary (Last 24 hours) at 12/24/2020 0811 Last data filed at 12/24/2020 0459 Gross per 24 hour  Intake 360 ml  Output 0 ml  Net 360 ml   Filed Weights   12/21/20 0112 12/22/20 0427 12/23/20 0553  Weight: 109.2 kg (P) 110 kg 110.8 kg    Examination:  General exam: No acute distress.  Still on room air. Respiratory system: Decreased breath sounds at bases bilaterally with scattered crackles cardiovascular system: Rate controlled, S1-S2  heard Gastrointestinal system: Abdomen is nondistended, soft and nontender.  Bowel sounds are heard  extremities: Lower extremity edema present.  No cyanosis  Central nervous system: Alert and oriented.  No focal neurological deficits.  Moves extremities  skin: Left foot dressing present.   Psychiatry: Affect is flat  Data Reviewed: I have personally reviewed following labs and imaging studies  CBC: Recent Labs  Lab 12/19/20 0826 12/20/20 0328 12/21/20 0440 12/22/20 0441 12/23/20 0641  WBC 17.3* 15.1* 15.9* 14.0* 22.7*  NEUTROABS 13.1* 10.4* 10.2* 8.8* 18.8*  HGB 12.3 11.7* 9.7* 10.1* 9.7*  HCT 38.2 37.4 30.8* 31.1* 31.5*  MCV 99.5 100.3* 99.4 98.1 101.6*  PLT 145* 153 141* 147* [id number]   Basic Metabolic Panel: Recent Labs  Lab 12/18/20 0411 12/19/20 [id number] 12/20/20 0328 12/21/20 0440 12/22/20 0441 12/23/20 [id number]  NA 134* 131* 135 135 135 138  K 4.8 5.3* 4.4 4.6 5.3* 4.9  CL 95* 96* 95* 97* 97* 97*  CO2 21* 18* '24 23 23 24  '$ GLUCOSE 82 89 107* 81 75 130*  BUN 43* 59* 32* 40* 50* 35*  CREATININE 9.09* 11.56* 7.49* 9.14* 10.68* 8.33*  CALCIUM 8.5* 8.5* 8.5* 8.8* 8.9 8.4*  PHOS 7.0* 7.5* 4.7*  --  6.0*  --    GFR: Estimated Creatinine Clearance: 9.6 mL/min (A) (by C-G formula based on SCr of 8.33 mg/dL (H)). Liver Function Tests: Recent Labs  Lab 12/18/20 0411 12/19/20 0826 12/20/20 0328 12/21/20 0440  AST  --   --   --  32  ALT  --   --   --  27  ALKPHOS  --   --   --  56  BILITOT  --   --   --  0.7  PROT  --   --   --  6.2*  ALBUMIN 3.3* 3.2* 3.1* 3.1*   No results for input(s): LIPASE, AMYLASE in the last 168 hours. No results for input(s): AMMONIA in the last 168 hours. Coagulation Profile: No results for input(s): INR, PROTIME in the last 168 hours. Cardiac Enzymes: No results for input(s): CKTOTAL, CKMB, CKMBINDEX, TROPONINI in the last 168 hours. BNP (last 3 results) No results for input(s): PROBNP in the last 8760 hours. HbA1C: No results for input(s):  HGBA1C in the last 72 hours. CBG: No results for input(s): GLUCAP in the last 168 hours. Lipid Profile: No results for input(s): CHOL, HDL, LDLCALC, TRIG, CHOLHDL, LDLDIRECT in the last 72 hours. Thyroid Function Tests: No results for input(s): TSH, T4TOTAL, FREET4, T3FREE, THYROIDAB in the last 72 hours. Anemia Panel: No results for input(s): VITAMINB12, FOLATE, FERRITIN, TIBC, IRON, RETICCTPCT in the last 72 hours. Sepsis Labs: No results for input(s): PROCALCITON, LATICACIDVEN in the last 168 hours.  Recent Results (from the past 240 hour(s))  Blood culture (routine x 2)     Status: None   Collection Time: 12/14/20  2:18 PM   Specimen: Right Antecubital; Blood  Result Value Ref Range Status   Specimen Description RIGHT ANTECUBITAL  Final   Special Requests   Final    BOTTLES DRAWN AEROBIC AND ANAEROBIC Blood Culture results may not be optimal due to an inadequate volume of blood received in culture bottles   Culture   Final    NO GROWTH 5 DAYS Performed at [facility], [address] [place], [place] [address]    Report Status 12/19/2020 FINAL  Final  Resp Panel by RT-PCR (Flu A&B, Covid) Nasopharyngeal Swab     Status: Abnormal   Collection Time: 12/14/20  7:21 PM   Specimen: Nasopharyngeal Swab; Nasopharyngeal(NP) swabs in vial transport medium  Result Value Ref Range Status   SARS Coronavirus 2 by RT PCR POSITIVE (A) NEGATIVE Final    Comment: RESULT CALLED TO, READ BACK BY AND VERIFIED WITH: HARRY,A @ 2016 ON 12/14/20 BY JUW (NOTE) SARS-CoV-2 target nucleic acids are DETECTED.  The SARS-CoV-2 RNA is generally detectable in upper respiratory specimens during the acute phase of infection. Positive results are indicative of the presence of the identified virus, but do not rule out bacterial infection or co-infection with other pathogens not detected by the test. Clinical correlation with patient history and other diagnostic information is necessary to determine  patient infection status. The expected result is Negative.  Fact Sheet for Patients: EntrepreneurPulse.com.au  Fact Sheet for Healthcare Providers:  IncredibleEmployment.be  This test is not yet approved or cleared by the [place] and  has been authorized for detection and/or diagnosis of SARS-CoV-2 by FDA under an Emergency Use Authorization (EUA).  This EUA will remain in effect (meaning this test can be  used) for the duration of  the COVID-19 declaration under Section 564(b)(1) of the Act, 21 U.S.C. section 360bbb-3(b)(1), unless the authorization is terminated or revoked sooner.     Influenza A by PCR NEGATIVE NEGATIVE Final   Influenza B by PCR NEGATIVE NEGATIVE Final    Comment: (NOTE) The Xpert Xpress SARS-CoV-2/FLU/RSV plus assay is intended as an aid in the diagnosis of influenza from Nasopharyngeal swab specimens and should not be used as a sole basis for treatment. Nasal washings and aspirates are unacceptable for Xpert Xpress SARS-CoV-2/FLU/RSV testing.  Fact Sheet for Patients: EntrepreneurPulse.com.au  Fact Sheet for Healthcare Providers: IncredibleEmployment.be  This test is not yet approved or cleared by the [place] FDA and has been authorized for detection and/or diagnosis of SARS-CoV-2 by FDA under an Emergency Use Authorization (EUA). This EUA will remain in effect (meaning this test can be used) for the duration of the COVID-19 declaration under Section 564(b)(1) of the Act, 21 U.S.C. section 360bbb-3(b)(1), unless the authorization is terminated or revoked.  Performed at [facility], [address] [place], [place] [address]   Blood culture (routine x 2)     Status: None   Collection Time: 12/14/20  9:25 PM   Specimen: Neck; Blood  Result Value Ref Range Status   Specimen Description NECK  Final   Special Requests   Final    BOTTLES DRAWN AEROBIC AND ANAEROBIC  Blood Culture adequate volume   Culture   Final    NO GROWTH 5 DAYS Performed at [facility], [address] [place], [place] [address]    Report Status 12/19/2020 FINAL  Final  Aerobic Culture (superficial specimen)     Status: None   Collection Time: 12/18/20  8:38 AM   Specimen: Wound  Result Value Ref Range Status   Specimen Description   Final    WOUND Performed at [facility], [address], [place], [place] [address]    Special Requests   Final    LEFT FOOT Performed at [facility], [address], [place], [place] [address]    Gram Stain   Final    RARE WBC PRESENT, PREDOMINANTLY MONONUCLEAR FEW GRAM POSITIVE COCCI RARE YEAST Performed at [facility], [place] [address], [place], [place] [address]    Culture   Final    MODERATE ENTEROCOCCUS FAECALIS MODERATE PROTEUS VULGARIS    Report Status 12/22/2020 FINAL  Final   Organism ID, Bacteria ENTEROCOCCUS FAECALIS  Final   Organism ID, Bacteria PROTEUS VULGARIS  Final      Susceptibility   Enterococcus faecalis - MIC*    AMPICILLIN <=2 SENSITIVE Sensitive     VANCOMYCIN 2 SENSITIVE Sensitive     GENTAMICIN SYNERGY SENSITIVE Sensitive     * MODERATE ENTEROCOCCUS FAECALIS   Proteus vulgaris - MIC*    AMPICILLIN >=32 RESISTANT Resistant     CEFAZOLIN >=64 RESISTANT Resistant     CEFEPIME <=0.12 SENSITIVE Sensitive     CEFTAZIDIME <=1 SENSITIVE Sensitive     CIPROFLOXACIN <=0.25 SENSITIVE Sensitive     GENTAMICIN <=1 SENSITIVE Sensitive     IMIPENEM 4 SENSITIVE Sensitive     TRIMETH/SULFA <=20 SENSITIVE Sensitive     AMPICILLIN/SULBACTAM 8 SENSITIVE Sensitive  PIP/TAZO <=4 SENSITIVE Sensitive     * MODERATE PROTEUS VULGARIS         Radiology Studies: PERIPHERAL VASCULAR CATHETERIZATION  Result Date: 12/22/2020 See op note  DG Foot Complete Right  Result Date: 12/23/2020 CLINICAL DATA:  Lateral foot wound EXAM: RIGHT FOOT COMPLETE - 3+ VIEW COMPARISON:  None. FINDINGS: No acute bony abnormality.  Specifically, no fracture, subluxation, or dislocation. No bone destruction. Joint spaces maintained. IMPRESSION: No acute bony abnormality. Electronically Signed   By: Rolm Baptise M.D.   On: 12/23/2020 18:17        Scheduled Meds: . aspirin EC  81 mg Oral Daily  . carvedilol  3.125 mg Oral BID WC  . chlorhexidine  60 mL Topical Once  . Chlorhexidine Gluconate Cloth  6 each Topical Daily  . gabapentin  100 mg Oral QHS  . heparin  5,000 Units Subcutaneous Q8H  . levothyroxine  300 mcg Oral QHS  . pantoprazole  40 mg Oral QHS  . povidone-iodine  2 application Topical Once  . sevelamer carbonate  3,200 mg Oral TID WC  . ticagrelor  90 mg Oral BID   Continuous Infusions: . piperacillin-tazobactam (ZOSYN)  IV 2.25 g (12/24/20 0516)          Aline August, MD [facility] 12/24/2020, 8:11 AM

## 2020-12-25 ENCOUNTER — Inpatient Hospital Stay: Payer: Self-pay

## 2020-12-25 DIAGNOSIS — A419 Sepsis, unspecified organism: Secondary | ICD-10-CM

## 2020-12-25 DIAGNOSIS — U071 COVID-19: Secondary | ICD-10-CM

## 2020-12-25 DIAGNOSIS — Z992 Dependence on renal dialysis: Secondary | ICD-10-CM | POA: Diagnosis not present

## 2020-12-25 DIAGNOSIS — R6521 Severe sepsis with septic shock: Secondary | ICD-10-CM

## 2020-12-25 DIAGNOSIS — N186 End stage renal disease: Secondary | ICD-10-CM | POA: Diagnosis not present

## 2020-12-25 DIAGNOSIS — J9601 Acute respiratory failure with hypoxia: Secondary | ICD-10-CM

## 2020-12-25 DIAGNOSIS — I4891 Unspecified atrial fibrillation: Secondary | ICD-10-CM

## 2020-12-25 DIAGNOSIS — L03032 Cellulitis of left toe: Secondary | ICD-10-CM | POA: Diagnosis not present

## 2020-12-25 LAB — CBC WITH DIFFERENTIAL/PLATELET
Abs Immature Granulocytes: 0.9 10*3/uL — ABNORMAL HIGH (ref 0.00–0.07)
Basophils Absolute: 0.1 10*3/uL (ref 0.0–0.1)
Basophils Relative: 1 %
Eosinophils Absolute: 0.2 10*3/uL (ref 0.0–0.5)
Eosinophils Relative: 1 %
HCT: 27.1 % — ABNORMAL LOW (ref 36.0–46.0)
Hemoglobin: 8.4 g/dL — ABNORMAL LOW (ref 12.0–15.0)
Immature Granulocytes: 7 %
Lymphocytes Relative: 9 %
Lymphs Abs: 1.1 10*3/uL (ref 0.7–4.0)
MCH: 31.5 pg (ref 26.0–34.0)
MCHC: 31 g/dL (ref 30.0–36.0)
MCV: 101.5 fL — ABNORMAL HIGH (ref 80.0–100.0)
Monocytes Absolute: 1.4 10*3/uL — ABNORMAL HIGH (ref 0.1–1.0)
Monocytes Relative: 11 %
Neutro Abs: 9.1 10*3/uL — ABNORMAL HIGH (ref 1.7–7.7)
Neutrophils Relative %: 71 %
Platelets: 150 10*3/uL (ref 150–400)
RBC: 2.67 MIL/uL — ABNORMAL LOW (ref 3.87–5.11)
RDW: 16.2 % — ABNORMAL HIGH (ref 11.5–15.5)
Smear Review: NORMAL
WBC: 12.8 10*3/uL — ABNORMAL HIGH (ref 4.0–10.5)
nRBC: 0 % (ref 0.0–0.2)

## 2020-12-25 LAB — LACTIC ACID, PLASMA: Lactic Acid, Venous: 1 mmol/L (ref 0.5–1.9)

## 2020-12-25 LAB — BASIC METABOLIC PANEL
Anion gap: 12 (ref 5–15)
BUN: 37 mg/dL — ABNORMAL HIGH (ref 6–20)
CO2: 25 mmol/L (ref 22–32)
Calcium: 8.4 mg/dL — ABNORMAL LOW (ref 8.9–10.3)
Chloride: 98 mmol/L (ref 98–111)
Creatinine, Ser: 7.45 mg/dL — ABNORMAL HIGH (ref 0.44–1.00)
GFR, Estimated: 6 mL/min — ABNORMAL LOW (ref >60)
Glucose, Bld: 86 mg/dL (ref 70–99)
Potassium: 4.6 mmol/L (ref 3.5–5.1)
Sodium: 135 mmol/L (ref 135–145)

## 2020-12-25 LAB — HEPATITIS B SURFACE ANTIGEN: Hepatitis B Surface Ag: NONREACTIVE

## 2020-12-25 LAB — GLUCOSE, CAPILLARY: Glucose-Capillary: 90 mg/dL (ref 70–99)

## 2020-12-25 MED ORDER — DILTIAZEM HCL 25 MG/5ML IV SOLN
5.0000 mg | Freq: Once | INTRAVENOUS | Status: DC
  Filled 2020-12-25: qty 5

## 2020-12-25 MED ORDER — DIGOXIN 0.25 MG/ML IJ SOLN
0.2500 mg | Freq: Once | INTRAMUSCULAR | Status: AC
  Administered 2020-12-25: 0.25 mg via INTRAVENOUS
  Filled 2020-12-25: qty 2

## 2020-12-25 MED ORDER — PHENYLEPHRINE HCL-NACL 10-0.9 MG/250ML-% IV SOLN
25.0000 ug/min | INTRAVENOUS | Status: DC
  Filled 2020-12-25: qty 250

## 2020-12-25 MED ORDER — AMIODARONE HCL IN DEXTROSE 360-4.14 MG/200ML-% IV SOLN: 60.0000 mg/h | INTRAVENOUS | Status: DC

## 2020-12-25 MED ORDER — NOREPINEPHRINE 4 MG/250ML-% IV SOLN: 0.0000 ug/min | INTRAVENOUS | Status: DC

## 2020-12-25 MED ORDER — SODIUM CHLORIDE 0.9 % IV BOLUS
250.0000 mL | Freq: Once | INTRAVENOUS | Status: AC
  Administered 2020-12-25: 250 mL via INTRAVENOUS

## 2020-12-25 MED ORDER — AMIODARONE HCL IN DEXTROSE 360-4.14 MG/200ML-% IV SOLN: 30.0000 mg/h | INTRAVENOUS | Status: DC

## 2020-12-25 MED ORDER — DILTIAZEM HCL-DEXTROSE 125-5 MG/125ML-% IV SOLN (PREMIX)
5.0000 mg/h | INTRAVENOUS | Status: DC
  Filled 2020-12-25: qty 125

## 2020-12-25 MED ORDER — DILTIAZEM HCL-DEXTROSE 125-5 MG/125ML-% IV SOLN (PREMIX)
5.0000 mg/h | INTRAVENOUS | Status: DC
  Administered 2020-12-25: 5 mg/h via INTRAVENOUS
  Filled 2020-12-25: qty 125

## 2020-12-25 MED ORDER — SODIUM CHLORIDE 0.9 % IV SOLN: 250.0000 mL | INTRAVENOUS | Status: DC

## 2020-12-25 MED ORDER — AMIODARONE LOAD VIA INFUSION
150.0000 mg | Freq: Once | INTRAVENOUS | Status: DC
  Filled 2020-12-25: qty 83.34

## 2020-12-25 NOTE — Progress Notes (Signed)
Received report from day shift RN and was informed patient having afib with RVR. Dr. Sidney Ace aware and ordered Digoxin and 250 ml bolus NS and entered an order for her to be transferred to 2A in order to be able to start patient on Amiodarone drip which 2C cannot do. Attempted to give report to 2A and was unable/had to wait for the [facility] and the unit to talk with each other and get back with me. Went and updated patient and obtained vitals which showed patient had a heart rate of 151 (digoxin and bolus had already been given without effect) and BP in the 40's over 30/s - called a Rapid and contacted Dr. Sidney Ace. Attempted manual BP twice as did Agricultural consultant and were unable to get a pressure. Patient asymptomatic at this time. Dr. Sidney Ace ordered patient to be immediately moved to the ICU. Patient handed over to ICU RN, Danae Chen.

## 2020-12-25 NOTE — Progress Notes (Signed)
   12/25/20 2057  Clinical Encounter Type  Visited With Patient  Visit Type Initial;Spiritual support  Referral From Nurse  Consult/Referral To [place] responded to a RR page in room 226A. When I arrived the door was opened and the medical team surrounded Pt's bed and in the doorway.  I asked one of the nurses for an update and she advise Pt is ok, she is alright her blood dropped a little low. I asked were there any family present or anyone I could contact for the Pt and the nurse said the Pt said she will call her husband herself. Prayer was rendered outside the door.

## 2020-12-25 NOTE — Consult Note (Signed)
NAME:  Lori RAGGS, MRN:  [id number], DOB:  13-Jan-1971, LOS: 5 ADMISSION DATE:  12/20/2020, CONSULTATION DATE:  12/25/2020 REFERRING MD: Dr. Sidney Ace, CHIEF COMPLAINT: Fall  Brief History:  This 50 year old female past medical history significant for CAD status post stent to LAD in 2020 & ESRD on HD, transferred from [facility] due to ischemic left lower extremity requiring vascular intervention.  Transferred to the ICU post rapid response in A. fib RVR requiring diltiazem drip.  History of Present Illness:  50 year old female initially presenting to [name], ED status post fall during transfer in and out of her car.  Per ED notes she fell backwards and hit her head, concerns for bleeding as the patient is on DAPT for recent stent. ED work-up negative for any intracranial abnormalities.  During this ED visit patient complained about a chronic wound on her left foot that had been present roughly a month and was being followed by podiatry outpatient.  Admitted to ICU for Hypotension and shock in the setting of end-stage renal disease, possibly septic shock, in the setting of end-stage renal disease on hemodialysis. -transferred to the ICU and started on IV pressor therapy with Levophed. -We will continue hydration as tolerated. -She is on IV vancomycin and cefepime for left foot cellulitis Assess for amputation -This could be certainly related to her atrial fibrillation.  Atrial fibrillation with rapid ventricular response.  IV amiodarone bolus and drip.  Acute hypoxemic respiratory failure due to Covid 19 pneumonia-Not in distress Minimal oxygen    Past Medical History:   Active Ambulatory Problems    Diagnosis Date Noted  . OBESITY 07/14/2010  . Cardiovascular disease 09/22/2010  . GASTROESOPHAGEAL REFLUX DISEASE 07/14/2010  . SYNCOPE 07/16/2010  . POSTURAL LIGHTHEADEDNESS 07/16/2010  . PALPITATIONS 07/14/2010  . HYPERPARATHYROIDISM, HX OF 07/14/2010  . Hypocalcemia  03/27/2014  . ESRD on dialysis ([place]) 03/27/2014  . Hypothyroidism 03/27/2014  . Chronic abdominal wound infection 03/27/2014  . Calciphylaxis 03/27/2014  . Non-STEMI (non-ST elevated myocardial infarction) ([place]) 02/09/2018  . Lymphedema 04/21/2018  . Chronic venous insufficiency 04/21/2018  . Superficial injury of right index finger with infection 12/25/2018  . Atherosclerotic heart disease of native coronary artery without angina pectoris 09/22/2010  . Glomerulonephritis 05/24/2016  . History of asthma 05/24/2016  . History of TIA (transient ischemic attack) 05/24/2016  . Hyperparathyroidism ([place]) 06/12/2016  . Nonrheumatic aortic valve stenosis 06/04/2016  . Vocal cord dysfunction 05/24/2016  . End stage renal disease ([place]) 03/27/2014  . Gastro-esophageal reflux disease without esophagitis 07/14/2010  . BMI 40.0-44.9, adult ([place]) 05/24/2016  . Personal history of other endocrine, nutritional and metabolic disease [id number]  . HTN (hypertension) 06/12/2016  . Chest pain 10/13/2019  . ACS (acute coronary syndrome) ([place]) 10/13/2019  . Atrial fibrillation ([place]) 10/13/2019  . ASCVD (arteriosclerotic cardiovascular disease)   . Allergy status to other drugs, medicaments and biological substances 03/18/2004  . Anaphylactic reaction due to adverse effect of correct drug or medicament properly administered, initial encounter 08/12/2019  . 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 Coagulation defect, unspecified ([place]) 10/28/2013  . Diarrhea, unspecified 10/28/2013  . Hyperlipidemia, unspecified 03/03/2018  . Hypertensive chronic kidney disease with stage 5 chronic kidney disease or end stage renal disease ([place]) 12/21/2012  . Iron deficiency anemia, unspecified 01/16/2014  . Mild protein-calorie malnutrition ([place]) 04/20/2014  . Other abnormalities of gait and mobility 01/04/2017  . Other disorders resulting from impaired renal tubular  function 10/01/2016  . Other  fluid overload 12/08/2015  . Other pruritus 05/21/2016  . Other specified symptoms and signs involving the circulatory and respiratory systems 10/28/2013  . Patient's noncompliance with other medical treatment and regimen 03/18/2004  . Secondary hyperparathyroidism of renal origin ([place]) 09/26/2009  . Shortness of breath 07/27/2014  . Very low level of personal hygiene 01/03/2017  . Infected wound 12/14/2020  . Cellulitis of fourth toe of left foot    Resolved Ambulatory Problems    Diagnosis Date Noted  . Anemia of other chronic disease 07/14/2010  . RENAL FAILURE, END STAGE 07/14/2010  . Cellulitis 08/11/2012  . Bleeding from wound 03/27/2014  . Hemorrhagic shock ([place]) 03/27/2014  . Acute blood loss anemia 03/27/2014  . Acute non Q wave myocardial infarction ([place]) 02/09/2018  . Complication from renal dialysis device 03/17/2018   Past Medical History:  Diagnosis Date  . Anemia   . Dialysis patient ([place])   . ESRD (end stage renal disease) ([place])   . GERD (gastroesophageal reflux disease)   . Hashimoto thyroiditis   . Hyperparathyroidism   . Hypertension   . Medically noncompliant   . Morbid obesity ([place])      Significant Hospital Events:  1/31 Admitted to ICU     Objective   Blood pressure (!) 46/35, pulse (!) 152, temperature 97.7 F (36.5 C), temperature source Oral, resp. rate 20, height '5\' 3"'$  (1.6 m), weight 110.8 kg, SpO2 93 %.        Intake/Output Summary (Last 24 hours) at 12/25/2020 2112 Last data filed at 12/25/2020 1700 Gross per 24 hour  Intake 845.15 ml  Output 840 ml  Net 5.15 ml   Filed Weights   12/21/20 0112 12/22/20 0427 12/23/20 0553  Weight: 109.2 kg (P) 110 kg 110.8 kg     Review of Systems:  Gen:  Denies  fever, sweats, chills weight loss  HEENT: Denies blurred vision, double vision, ear pain, eye pain, hearing loss, nose bleeds, sore throat Cardiac:  No dizziness, chest pain or heaviness, chest  tightness,edema, No JVD Resp:   No cough, -sputum production, -shortness of breath,-wheezing, -hemoptysis,  Gi: Denies swallowing difficulty, stomach pain, nausea or vomiting, diarrhea, constipation, bowel incontinence Gu:  Denies bladder incontinence, burning urine Ext:   Denies Joint pain, stiffness or swelling Skin: Denies  skin rash, easy bruising or bleeding or hives Endoc:  Denies polyuria, polydipsia , polyphagia or weight change Psych:   Denies depression, insomnia or hallucinations  Other:  All other systems negative   Physical Examination:   General Appearance: No distress  Neuro:without focal findings,  speech normal,  HEENT: PERRLA, EOM intact.   Pulmonary: normal breath sounds, No wheezing.  CardiovascularNormal S1,S2.  No m/r/g.   Abdomen: Benign, Soft, non-tender. Renal:  No costovertebral tenderness  GU:  Not performed at this time. Endoc: No evident thyromegaly Skin:   warm, no rashes, no ecchymosis  Extremities: normal, no cyanosis, clubbing. PSYCHIATRIC: Mood, affect within normal limits.   ALL OTHER ROS ARE NEGATIVE  CBC    Component Value Date/Time   WBC 14.3 (H) 12/26/2020 0600   RBC 2.54 (L) 12/26/2020 0600   HGB 8.0 (L) 12/26/2020 0600   HGB 10.0 (L) 03/26/2014 1842   HCT 25.5 (L) 12/26/2020 0600   HCT 32.0 (L) 03/26/2014 1842   PLT 167 12/26/2020 0600   PLT 332 03/26/2014 1425   MCV 100.4 (H) 12/26/2020 0600   MCV 90 03/26/2014 1425   MCH 31.5 12/26/2020 0600   MCHC 31.4 12/26/2020 0600  RDW 16.2 (H) 12/26/2020 0600   RDW 15.3 (H) 03/26/2014 1425   LYMPHSABS 0.9 12/26/2020 0600   LYMPHSABS 1.0 03/26/2014 1425   MONOABS 1.5 (H) 12/26/2020 0600   MONOABS 0.8 03/26/2014 1425   EOSABS 0.2 12/26/2020 0600   EOSABS 0.3 03/26/2014 1425   BASOSABS 0.1 12/26/2020 0600   BASOSABS 0.2 (H) 03/26/2014 1425   BMP Latest Ref Rng & Units 12/26/2020 12/26/2020 12/25/2020  Glucose 70 - 99 mg/dL 107(H) 142(H) 86  BUN 6 - 20 mg/dL 50(H) 46(H) 37(H)   Creatinine 0.44 - 1.00 mg/dL 9.01(H) 8.07(H) 7.45(H)  Sodium 135 - 145 mmol/L 134(L) 135 135  Potassium 3.5 - 5.1 mmol/L 5.1 4.7 4.6  Chloride 98 - 111 mmol/L 97(L) 98 98  CO2 22 - 32 mmol/L '25 25 25  '$ Calcium 8.9 - 10.3 mg/dL 8.5(L) 7.8(L) 8.4(L)     ASSESSMENT AND PLAN  Admitted to ICU for Hypotension and shock in the setting of end-stage renal disease, possibly septic shock, in the setting of end-stage renal disease on hemodialysis. -transferred to the ICU and started on IV pressor therapy with Levophed. Off pressors now -We will continue hydration as tolerated. -She is on IV vancomycin and cefepime for left foot cellulitis Assess for amputation -This could be certainly related to her atrial fibrillation.  Atrial fibrillation with rapid ventricular response.  IV amiodarone bolus and drip.  Acute hypoxemic respiratory failure due to Covid 19 pneumonia-Not in distress Minimal oxygen  Transfer Back to [facility] service   Daje Stark Patricia Pesa, M.D.  [facility]  Medical Director [place] Director [facility] Cardio-Pulmonary Department

## 2020-12-25 NOTE — Plan of Care (Signed)
Continuing with plan of care. 

## 2020-12-25 NOTE — Progress Notes (Signed)
Events from yesterday noted- unstable with anesthesia prior to Podiatry intervention. Will hold off on angiogram tomorrow. Will plan anesthesia in conjunction with Podiatry next week.

## 2020-12-25 NOTE — Progress Notes (Signed)
Patient ID: Lori [name], female   DOB: 05-15-1971, 50 y.o.   MRN: [id number]  PROGRESS NOTE    Lori [name]  [id number] DOB: 1971/10/17 DOA: 12/20/2020 PCP: Patient, No Pcp Per   Brief Narrative:  50 y.o. female with medical history significant for CAD s/p stent to LAD in 2020, ESRD on HD MWF, hypertension, hypothyroidism, hyperparathyroidism, GERD who was initially admitted to [facility] on 12/14/2020 for a left foot wound.  She was started on antibiotics.  General surgery evaluated the patient and opined that she may ultimately need a left AKA.  Patient wanted to discuss with her vascular surgeon Dr. Delana Meyer before any surgery would be considered.  She was transferred to [facility] for further evaluation.  At [facility], she was found to be incidentally Covid positive on 12/14/2020 but remained asymptomatic and was treated with 3 days of IV remdesivir.  Nephrology has been following for continuation of dialysis.  Wound culture from 12/18/2020 grew Enterococcus and Proteus.  Assessment & Plan:   Left foot wound infection with gangrene/chronic ischemia of the left lower extremity -Dry necrotic/ischemic appearance of left fourth and fifth toes -Transferred from [facility] for vascular surgery evaluation: Status post aortogram and left lower extremity angiogram with angioplasty of left peroneal artery/left SFA and popliteal arteries along with mechanical thrombectomy and stent placement of the left SFA and popliteal artery on 12/22/2020.  Vascular surgery now recommending outpatient follow-up.  Will possibly add statin on discharge -Antibiotics switched to Zosyn alone on 12/22/2020 based on wound culture which has grown Enterococcus faecalis and Proteus vulgaris -Podiatry following: Surgical intervention plan was aborted on 12/24/2020 because of hypotension.  As per podiatry, patient also needs vascular work-up for right fifth metatarsal phalangeal joint gangrene  Leukocytosis -From above.   Improving.  Monitor.  End-stage renal disease on hemodialysis -Nephrology following.  Continue dialysis as per nephrology schedule  Anemia of chronic disease -From renal failure.  Hemoglobin stable.  COVID-19 positive -Incidentally Covid + 12/14/2020.  She has been asymptomatic.  She states she has been fully vaccinated including booster dose.  She received 3 days IV remdesivir.  Currently no need for further treatment.  -Still on room air.  Paroxysmal atrial fibrillation: -Not on anticoagulation or currently on rate/rhythm controlling agents.    Continue aspirin.  CAD: -Continue aspirin, Coreg and Brilinta. -Not currently on statin  Hypertension: -Monitor blood pressure.  Continue Coreg  Hypothyroidism: -Continue Synthroid   DVT prophylaxis: Heparin Code Status: Full Family Communication: None Disposition Plan: Status is: Inpatient  Remains inpatient appropriate because:Inpatient level of care appropriate due to severity of illness   Dispo: The patient is from: Home              Anticipated d/c is to: Home              Anticipated d/c date is: > 3 days              Patient currently is not medically stable to d/c.   Difficult to place patient No   Consultants: Vascular surgery/nephrology/podiatry  Procedures: aortogram and left lower extremity angiogram with angioplasty of left peroneal artery/left SFA and popliteal arteries along with mechanical thrombectomy and stent placement of the left SFA and popliteal artery on 12/22/2020  Antimicrobials:  Anti-infectives (From admission, onward)   Start     Dose/Rate Route Frequency Ordered Stop   12/22/20 1400  piperacillin-tazobactam (ZOSYN) IVPB 2.25 g        2.25  g 100 mL/hr over 30 Minutes Intravenous Every 8 hours 12/22/20 1314     12/22/20 1330  ceFAZolin (ANCEF) IVPB 1 g/50 mL premix       Note to Pharmacy: To be given in specials   1 g 100 mL/hr over 30 Minutes Intravenous 60 min pre-op 12/22/20 0052 12/22/20  1607   12/22/20 1257  ceFAZolin (ANCEF) 1-4 GM/50ML-% IVPB       Note to Pharmacy: Corlis Hove   : cabinet override      12/22/20 1257 12/22/20 1610   12/22/20 1200  vancomycin (VANCOCIN) IVPB 1000 mg/200 mL premix  Status:  Discontinued        1,000 mg 200 mL/hr over 60 Minutes Intravenous Every T-Th-Sa (Hemodialysis) 12/22/20 1128 12/22/20 1314   12/22/20 0145  ceFAZolin (ANCEF) IVPB 1 g/50 mL premix  Status:  Discontinued       Note to Pharmacy: To be given in specials   1 g 100 mL/hr over 30 Minutes Intravenous  Once 12/22/20 0048 12/22/20 0050   12/21/20 1200  ceFEPIme (MAXIPIME) 2 g in sodium chloride 0.9 % 100 mL IVPB  Status:  Discontinued        2 g 200 mL/hr over 30 Minutes Intravenous Every M-W-F (Hemodialysis) 12/20/20 2038 12/22/20 1314   12/21/20 1200  vancomycin (VANCOCIN) IVPB 1000 mg/200 mL premix  Status:  Discontinued        1,000 mg 200 mL/hr over 60 Minutes Intravenous Every M-W-F (Hemodialysis) 12/20/20 2038 12/22/20 1128   12/20/20 2200  metroNIDAZOLE (FLAGYL) tablet 500 mg  Status:  Discontinued        500 mg Oral Every 8 hours 12/20/20 2038 12/22/20 1314       Subjective: Patient seen and examined at bedside.  Denies worsening shortness of breath.  No overnight fever, vomiting, worsening lower extremity pain reported.   Objective: Vitals:   12/25/20 0000 12/25/20 0013 12/25/20 0020 12/25/20 0556  BP: '97/82 97/72 96/74 '$ (!) 88/77  Pulse: 92 90 93 87  Resp:    18  Temp:   98 F (36.7 C) 97.8 F (36.6 C)  TempSrc:   Oral Oral  SpO2:   100% 90%  Weight:      Height:        Intake/Output Summary (Last 24 hours) at 12/25/2020 0803 Last data filed at 12/25/2020 0020 Gross per 24 hour  Intake -  Output 840 ml  Net -840 ml   Filed Weights   12/21/20 0112 12/22/20 0427 12/23/20 0553  Weight: 109.2 kg (P) 110 kg 110.8 kg    Examination:  General exam: Currently on room air.  No distress.   Respiratory system: Bilateral decreased breath sounds at  bases, some crackles heard, no wheezing  cardiovascular system: S1-S2 heard, rate controlled Gastrointestinal system: Abdomen is nondistended, soft and nontender.  Normal bowel sounds heard  extremities: No clubbing.  Bilateral lower extremity trace edema present  Central nervous system: Awake and alert.  No focal neurological deficits.  Moving extremities.   Skin: Bilateral foot dressing present  psychiatry: Flat affect  Data Reviewed: I have personally reviewed following labs and imaging studies  CBC: Recent Labs  Lab 12/20/20 0328 12/21/20 0440 12/22/20 0441 12/23/20 0641 12/24/20 0820 12/25/20 0700  WBC 15.1* 15.9* 14.0* 22.7* 14.2* 12.8*  NEUTROABS 10.4* 10.2* 8.8* 18.8*  --  PENDING  HGB 11.7* 9.7* 10.1* 9.7* 8.9* 8.4*  HCT 37.4 30.8* 31.1* 31.5* 28.2* 27.1*  MCV 100.3* 99.4 98.1 101.6* 101.1* 101.5*  PLT 153 141* 147* 165 161 [id number]   Basic Metabolic Panel: Recent Labs  Lab 12/19/20 0826 12/20/20 0328 12/21/20 0440 12/22/20 0441 12/23/20 0641 12/24/20 0820 12/25/20 0700  NA 131* 135 135 135 138 138 135  K 5.3* 4.4 4.6 5.3* 4.9 4.9 4.6  CL 96* 95* 97* 97* 97* 97* 98  CO2 18* '24 23 23 24 25 25  '$ GLUCOSE 89 107* 81 75 130* 87 86  BUN 59* 32* 40* 50* 35* 50* 37*  CREATININE 11.56* 7.49* 9.14* 10.68* 8.33* 9.75* 7.45*  CALCIUM 8.5* 8.5* 8.8* 8.9 8.4* 8.7* 8.4*  PHOS 7.5* 4.7*  --  6.0*  --  6.1*  --    GFR: Estimated Creatinine Clearance: 10.7 mL/min (A) (by C-G formula based on SCr of 7.45 mg/dL (H)). Liver Function Tests: Recent Labs  Lab 12/19/20 0826 12/20/20 0328 12/21/20 0440 12/24/20 0820  AST  --   --  32  --   ALT  --   --  27  --   ALKPHOS  --   --  56  --   BILITOT  --   --  0.7  --   PROT  --   --  6.2*  --   ALBUMIN 3.2* 3.1* 3.1* 3.2*   No results for input(s): LIPASE, AMYLASE in the last 168 hours. No results for input(s): AMMONIA in the last 168 hours. Coagulation Profile: No results for input(s): INR, PROTIME in the last 168  hours. Cardiac Enzymes: No results for input(s): CKTOTAL, CKMB, CKMBINDEX, TROPONINI in the last 168 hours. BNP (last 3 results) No results for input(s): PROBNP in the last 8760 hours. HbA1C: No results for input(s): HGBA1C in the last 72 hours. CBG: No results for input(s): GLUCAP in the last 168 hours. Lipid Profile: No results for input(s): CHOL, HDL, LDLCALC, TRIG, CHOLHDL, LDLDIRECT in the last 72 hours. Thyroid Function Tests: No results for input(s): TSH, T4TOTAL, FREET4, T3FREE, THYROIDAB in the last 72 hours. Anemia Panel: No results for input(s): VITAMINB12, FOLATE, FERRITIN, TIBC, IRON, RETICCTPCT in the last 72 hours. Sepsis Labs: No results for input(s): PROCALCITON, LATICACIDVEN in the last 168 hours.  Recent Results (from the past 240 hour(s))  Aerobic Culture (superficial specimen)     Status: None   Collection Time: 12/18/20  8:38 AM   Specimen: Wound  Result Value Ref Range Status   Specimen Description   Final    WOUND Performed at [facility], [address], [place], [place] [address]    Special Requests   Final    LEFT FOOT Performed at [facility], [address], [place], [place] [address]    Gram Stain   Final    RARE WBC PRESENT, PREDOMINANTLY MONONUCLEAR FEW GRAM POSITIVE COCCI RARE YEAST Performed at [facility] [address], [place], [place] [address]    Culture   Final    MODERATE ENTEROCOCCUS FAECALIS MODERATE PROTEUS VULGARIS    Report Status 12/22/2020 FINAL  Final   Organism ID, Bacteria ENTEROCOCCUS FAECALIS  Final   Organism ID, Bacteria PROTEUS VULGARIS  Final      Susceptibility   Enterococcus faecalis - MIC*    AMPICILLIN <=2 SENSITIVE Sensitive     VANCOMYCIN 2 SENSITIVE Sensitive     GENTAMICIN SYNERGY SENSITIVE Sensitive     * MODERATE ENTEROCOCCUS FAECALIS   Proteus vulgaris - MIC*    AMPICILLIN >=32 RESISTANT Resistant     CEFAZOLIN >=64 RESISTANT Resistant     CEFEPIME <=0.12  SENSITIVE Sensitive      CEFTAZIDIME <=1 SENSITIVE Sensitive     CIPROFLOXACIN <=0.25 SENSITIVE Sensitive     GENTAMICIN <=1 SENSITIVE Sensitive     IMIPENEM 4 SENSITIVE Sensitive     TRIMETH/SULFA <=20 SENSITIVE Sensitive     AMPICILLIN/SULBACTAM 8 SENSITIVE Sensitive     PIP/TAZO <=4 SENSITIVE Sensitive     * MODERATE PROTEUS VULGARIS         Radiology Studies: DG Foot Complete Right  Result Date: 12/23/2020 CLINICAL DATA:  Lateral foot wound EXAM: RIGHT FOOT COMPLETE - 3+ VIEW COMPARISON:  None. FINDINGS: No acute bony abnormality. Specifically, no fracture, subluxation, or dislocation. No bone destruction. Joint spaces maintained. IMPRESSION: No acute bony abnormality. Electronically Signed   By: Rolm Baptise M.D.   On: 12/23/2020 18:17        Scheduled Meds: . aspirin EC  81 mg Oral Daily  . carvedilol  3.125 mg Oral BID WC  . Chlorhexidine Gluconate Cloth  6 each Topical Daily  . epoetin (EPOGEN/PROCRIT) injection  8,000 Units Intravenous Q T,Th,Sa-HD  . gabapentin  100 mg Oral QHS  . heparin  5,000 Units Subcutaneous Q8H  . levothyroxine  300 mcg Oral QHS  . pantoprazole  40 mg Oral QHS  . sevelamer carbonate  3,200 mg Oral TID WC  . ticagrelor  90 mg Oral BID   Continuous Infusions: . piperacillin-tazobactam (ZOSYN)  IV 2.25 g (12/25/20 0536)          Aline August, MD [facility] 12/25/2020, 8:03 AM

## 2020-12-25 NOTE — Progress Notes (Signed)
At 1900 central tele called saying the patients heart rate was sustained in the 140s with intermittent increases up to the 160s.  Dr. Sidney Ace notified and ordered Digoxin IV push and a 280m bolus of normal saline.  Oncoming nurse updated on status of patient.

## 2020-12-25 NOTE — Progress Notes (Signed)
Lori [name]  MRN: [id number]  DOB/AGE: 1971-05-29 50 y.o.  Primary Care Physician:Patient, No Pcp Per  Admit date: 12/20/2020  Chief Complaint: No chief complaint on file.   S-Pt presented on  12/20/2020 with No chief complaint on file. .    Pt today feels better  Medications . aspirin EC  81 mg Oral Daily  . carvedilol  3.125 mg Oral BID WC  . Chlorhexidine Gluconate Cloth  6 each Topical Daily  . epoetin (EPOGEN/PROCRIT) injection  8,000 Units Intravenous Q T,Th,Sa-HD  . gabapentin  100 mg Oral QHS  . heparin  5,000 Units Subcutaneous Q8H  . levothyroxine  300 mcg Oral QHS  . pantoprazole  40 mg Oral QHS  . sevelamer carbonate  3,200 mg Oral TID WC  . ticagrelor  90 mg Oral BID         [id number] from the symptoms mentioned above,there are no other symptoms referable to all systems reviewed.  Physical Exam: Vital signs in last 24 hours: Temp:  [97.5 F (36.4 C)-98 F (36.7 C)] 97.5 F (36.4 C) (01/30 1116) Pulse Rate:  [65-107] 74 (01/30 1116) Resp:  [16-18] 18 (01/30 1116) BP: (87-123)/(65-94) 87/71 (01/30 1116) SpO2:  [90 %-100 %] 92 % (01/30 1116) Weight change:  Last BM Date: 12/18/20  Intake/Output from previous day: 01/29 0701 - 01/30 0700 In: -  Out: 840  No intake/output data recorded.   Physical Exam:  General- pt is awake,alert, oriented to time place and person  Resp- No acute REsp distress, CTA B/L NO Rhonchi  CVS- S1S2 regular in rate and rhythm  GIT- BS+, soft, Non tender , Non distended  EXT- No LE Edema,  No Cyanosis Bilaterally feet in  bandages   Access-  tunneled cath    Lab Results:  CBC  Recent Labs    12/24/20 0820 12/25/20 0700  WBC 14.2* 12.8*  HGB 8.9* 8.4*  HCT 28.2* 27.1*  PLT 161 150    BMET  Recent Labs    12/24/20 0820 12/25/20 0700  NA 138 135  K 4.9 4.6  CL 97* 98  CO2 25 25  GLUCOSE 87 86  BUN 50* 37*  CREATININE 9.75* 7.45*  CALCIUM 8.7* 8.4*      Most recent Creatinine trend   Lab Results  Component Value Date   CREATININE 7.45 (H) 12/25/2020   CREATININE 9.75 (H) 12/24/2020   CREATININE 8.33 (H) 12/23/2020      MICRO   Recent Results (from the past 240 hour(s))  Aerobic Culture (superficial specimen)     Status: None   Collection Time: 12/18/20  8:38 AM   Specimen: Wound  Result Value Ref Range Status   Specimen Description   Final    WOUND Performed at [name], [address] [place], [place] [address]    Special Requests   Final    LEFT FOOT Performed at [name], [address], [place], [place] [address]    Gram Stain   Final    RARE WBC PRESENT, PREDOMINANTLY MONONUCLEAR FEW GRAM POSITIVE COCCI RARE YEAST Performed at [name], [place] [address], [place], [place] [address]    Culture   Final    MODERATE ENTEROCOCCUS FAECALIS MODERATE PROTEUS VULGARIS    Report Status 12/22/2020 FINAL  Final   Organism ID, Bacteria ENTEROCOCCUS FAECALIS  Final   Organism ID, Bacteria PROTEUS VULGARIS  Final      Susceptibility   Enterococcus faecalis - MIC*  AMPICILLIN <=2 SENSITIVE Sensitive     VANCOMYCIN 2 SENSITIVE Sensitive     GENTAMICIN SYNERGY SENSITIVE Sensitive     * MODERATE ENTEROCOCCUS FAECALIS   Proteus vulgaris - MIC*    AMPICILLIN >=32 RESISTANT Resistant     CEFAZOLIN >=64 RESISTANT Resistant     CEFEPIME <=0.12 SENSITIVE Sensitive     CEFTAZIDIME <=1 SENSITIVE Sensitive     CIPROFLOXACIN <=0.25 SENSITIVE Sensitive     GENTAMICIN <=1 SENSITIVE Sensitive     IMIPENEM 4 SENSITIVE Sensitive     TRIMETH/SULFA <=20 SENSITIVE Sensitive     AMPICILLIN/SULBACTAM 8 SENSITIVE Sensitive     PIP/TAZO <=4 SENSITIVE Sensitive     * MODERATE PROTEUS VULGARIS         Impression:  [name] is a 50 y.o. white female with end stage renal disease on hemodialysis, coronary artery disease, hypertension, peripheral vascular disease, hypothyroidism,who was admitted to [name] on1/25/2022for Cellulitis of fourth toe of  left foot [L03.032]  Patient initially admitted to [name]. Patient transferred to [name] for vascular services. Patient with incidental COVID diagnosis.   [name]) MWF [facility] RIJ permcath 101.5kg   1)Renal   ESRD Patient is on hemodialysis Patient is on Monday Wednesday Friday schedule as an outpatient As an inpatient patient is on Tuesday Thursday Saturday schedule secondary to Covid/staffing/inpatient status Patient was last dialyzed yesterday No need for renal placement therapy today   2)HTN  Blood pressure is stable    3)Anemia of chronic disease  CBC Latest Ref Rng & Units 12/25/2020 12/24/2020 12/23/2020  WBC 4.0 - 10.5 K/uL 12.8(H) 14.2(H) 22.7(H)  Hemoglobin 12.0 - 15.0 g/dL 8.4(L) 8.9(L) 9.7(L)  Hematocrit 36.0 - 46.0 % 27.1(L) 28.2(L) 31.5(L)  Platelets 150 - 400 K/uL 150 161 165       HGb is just below the goal (9--11) We will keep patient is on Epogen protocol  4) Secondary hyperparathyroidism -CKD Mineral-Bone Disorder    Lab Results  Component Value Date   PTH 312.5 (H) 03/27/2014   CALCIUM 8.4 (L) 12/25/2020   CAION 0.80 (L) 03/27/2014   PHOS 6.1 (H) 12/24/2020    Secondary Hyperparathyroidism present Phosphorus is not at goal. Patient is on binders  5) COVID-19 Primary team is following   6) Electrolytes   BMP Latest Ref Rng & Units 12/25/2020 12/24/2020 12/23/2020  Glucose 70 - 99 mg/dL 86 87 130(H)  BUN 6 - 20 mg/dL 37(H) 50(H) 35(H)  Creatinine 0.44 - 1.00 mg/dL 7.45(H) 9.75(H) 8.33(H)  Sodium 135 - 145 mmol/L 135 138 138  Potassium 3.5 - 5.1 mmol/L 4.6 4.9 4.9  Chloride 98 - 111 mmol/L 98 97(L) 97(L)  CO2 22 - 32 mmol/L '25 25 24  '$ Calcium 8.9 - 10.3 mg/dL 8.4(L) 8.7(L) 8.4(L)     Sodium Normonatremic   Potassium Normokalemic    7)Acid base    Co2 at goal  8) peripheral vascular disease Patient is s/p angioplasty Vascular surgeon and primary team is  following   Plan:  No need  for dialysis today     Lori [name] 12/25/2020, 12:38 PM

## 2020-12-25 NOTE — Progress Notes (Signed)
Critical care note: -Date of note: 12/25/2020  Subjective: The patient has been in atrial fibrillation with rapid response in the 160s.  She was having borderline blood pressure of 103/64.  She was ordered 250 mL IV normal saline bolus and 0.25 of IV digoxin that helped bringing heart rate to 124.  She has not had any chest pain or palpitations, headache or dizziness or lightheadedness or dyspnea.  No nausea or vomiting or abdominal pain.  No fever or chills.  Unfortunately blood pressure dropped to 86 systolic and another bolus of 250 mL IV normal saline was ordered.  As I was in the room I was notified that her systolic blood pressure was in the 40s to 50s.  The patient was having mild conversational dyspnea.  Objective: Physical examination Generally: Acutely ill pleasant elderly Caucasian female with mild respiratory distress and conversational dyspnea. Vital signs per history of present illness.  Temperature was 97.7 respiratory rate was 20 with pulse 70 of 93% on 1 and half liters of O2 by nasal cannula. Head - atraumatic, normocephalic.  Pupils - equal, round and reactive to light and accommodation. Extraocular movements are intact. No scleral icterus.  Oropharynx - moist mucous membranes and tongue. No pharyngeal erythema or exudate.  Neck - supple. No JVD. Carotid pulses 2+ bilaterally. No carotid bruits. No palpable thyromegaly or lymphadenopathy. Cardiovascular -irregularly irregular tachycardic rhythm. Normal S1 and S2. No murmurs, gallops or rubs.  Lungs -diminished bibasal breath sounds with bibasal crackles Abdomen - soft and nontender. Positive bowel sounds. No palpable organomegaly or masses.  Extremities - no pitting edema, clubbing or cyanosis.  Neuro - grossly non-focal. Skin - no rashes. Breast, pelvic and rectal - deferred.  Labs and notes were reviewed.  Assessment/plan: 1 -Hypotension and shock in the setting of end-stage renal disease, possibly septic shock, in the  setting of end-stage renal disease on hemodialysis. -The patient will be transferred to the ICU and started on IV pressor therapy with Levophed. -We will continue hydration as tolerated. -She is on IV vancomycin and cefepime for left foot cellulitis -This could be certainly related to her atrial fibrillation.  2.  Atrial fibrillation with rapid ventricular response. -I ordered IV amiodarone bolus and drip. -She will need serial troponin I's and cardiology evaluation. -Had a 2D echo on 121 revealing an EF of 55 to 60% with grade 2 diastolic dysfunction.  She had trivial mitral regurgitation. -The patient continues to have worsening symptoms with a rapid rate she may need DC cardioversion and urgent cardiology consult.  This was discussed with the ICU team.  3.  Acute hypoxemic respiratory failure due to Covid 19 pneumonia. -The patient has received 3 days of IV remdesivir.  I have discussed the case with the ICU team who will take over her care.  Authorized and performed by: Eugenie Norrie, MD Total critical care time: Approximately 40  minutes. Due to a high probability of clinically significant, life-threatening deterioration, the patient required my highest level of preparedness to intervene emergently and I personally spent this critical care time directly and personally managing the patient.  This critical care time included obtaining a history, examining the patient, pulse oximetry, ordering and review of studies, arranging urgent treatment with development of management plan, evaluation of patient's response to treatment, frequent reassessment, and discussions with other providers. This critical care time was performed to assess and manage the high probability of imminent, life-threatening deterioration that could result in multiorgan failure.  It was exclusive of separately  billable procedures and treating other patients and teaching time.  Please see MDM section and the rest of the note for  further information on patient assessment and treatment.

## 2020-12-26 ENCOUNTER — Encounter
Admission: AD | Disposition: A | Discharge status: Skilled Nursing Facility | Payer: Self-pay | Source: Other Acute Inpatient Hospital | Attending: Internal Medicine

## 2020-12-26 DIAGNOSIS — Z992 Dependence on renal dialysis: Secondary | ICD-10-CM | POA: Diagnosis not present

## 2020-12-26 DIAGNOSIS — I120 Hypertensive chronic kidney disease with stage 5 chronic kidney disease or end stage renal disease: Secondary | ICD-10-CM | POA: Diagnosis not present

## 2020-12-26 DIAGNOSIS — N186 End stage renal disease: Secondary | ICD-10-CM | POA: Diagnosis not present

## 2020-12-26 LAB — BASIC METABOLIC PANEL
Anion gap: 12 (ref 5–15)
Anion gap: 12 (ref 5–15)
BUN: 46 mg/dL — ABNORMAL HIGH (ref 6–20)
BUN: 50 mg/dL — ABNORMAL HIGH (ref 6–20)
CO2: 25 mmol/L (ref 22–32)
CO2: 25 mmol/L (ref 22–32)
Calcium: 7.8 mg/dL — ABNORMAL LOW (ref 8.9–10.3)
Calcium: 8.5 mg/dL — ABNORMAL LOW (ref 8.9–10.3)
Chloride: 98 mmol/L (ref 98–111)
Chloride: 97 mmol/L — ABNORMAL LOW (ref 98–111)
Creatinine, Ser: 8.07 mg/dL — ABNORMAL HIGH (ref 0.44–1.00)
Creatinine, Ser: 9.01 mg/dL — ABNORMAL HIGH (ref 0.44–1.00)
GFR, Estimated: 6 mL/min — ABNORMAL LOW (ref >60)
GFR, Estimated: 5 mL/min — ABNORMAL LOW (ref >60)
Glucose, Bld: 142 mg/dL — ABNORMAL HIGH (ref 70–99)
Glucose, Bld: 107 mg/dL — ABNORMAL HIGH (ref 70–99)
Potassium: 4.7 mmol/L (ref 3.5–5.1)
Potassium: 5.1 mmol/L (ref 3.5–5.1)
Sodium: 135 mmol/L (ref 135–145)
Sodium: 134 mmol/L — ABNORMAL LOW (ref 135–145)

## 2020-12-26 LAB — CBC WITH DIFFERENTIAL/PLATELET
Abs Immature Granulocytes: 0.76 10*3/uL — ABNORMAL HIGH (ref 0.00–0.07)
Basophils Absolute: 0.1 10*3/uL (ref 0.0–0.1)
Basophils Relative: 1 %
Eosinophils Absolute: 0.2 10*3/uL (ref 0.0–0.5)
Eosinophils Relative: 1 %
HCT: 25.5 % — ABNORMAL LOW (ref 36.0–46.0)
Hemoglobin: 8 g/dL — ABNORMAL LOW (ref 12.0–15.0)
Immature Granulocytes: 5 %
Lymphocytes Relative: 6 %
Lymphs Abs: 0.9 10*3/uL (ref 0.7–4.0)
MCH: 31.5 pg (ref 26.0–34.0)
MCHC: 31.4 g/dL (ref 30.0–36.0)
MCV: 100.4 fL — ABNORMAL HIGH (ref 80.0–100.0)
Monocytes Absolute: 1.5 10*3/uL — ABNORMAL HIGH (ref 0.1–1.0)
Monocytes Relative: 10 %
Neutro Abs: 10.9 10*3/uL — ABNORMAL HIGH (ref 1.7–7.7)
Neutrophils Relative %: 77 %
Platelets: 167 10*3/uL (ref 150–400)
RBC: 2.54 MIL/uL — ABNORMAL LOW (ref 3.87–5.11)
RDW: 16.2 % — ABNORMAL HIGH (ref 11.5–15.5)
WBC: 14.3 10*3/uL — ABNORMAL HIGH (ref 4.0–10.5)
nRBC: 0 % (ref 0.0–0.2)

## 2020-12-26 LAB — MAGNESIUM
Magnesium: 1.6 mg/dL — ABNORMAL LOW (ref 1.7–2.4)
Magnesium: 1.7 mg/dL (ref 1.7–2.4)

## 2020-12-26 LAB — PHOSPHORUS
Phosphorus: 4.5 mg/dL (ref 2.5–4.6)
Phosphorus: 4.9 mg/dL — ABNORMAL HIGH (ref 2.5–4.6)

## 2020-12-26 LAB — GLUCOSE, CAPILLARY
Glucose-Capillary: 81 mg/dL (ref 70–99)
Glucose-Capillary: 110 mg/dL — ABNORMAL HIGH (ref 70–99)

## 2020-12-26 SURGERY — LOWER EXTREMITY ANGIOGRAPHY
Anesthesia: General | Laterality: Right

## 2020-12-26 MED ORDER — SODIUM CHLORIDE 0.9% FLUSH
10.0000 mL | Freq: Two times a day (BID) | INTRAVENOUS | Status: DC
  Administered 2020-12-26 – 2021-01-10 (×29): 10 mL

## 2020-12-26 MED ORDER — SODIUM CHLORIDE 0.9% FLUSH: 10.0000 mL | INTRAVENOUS | Status: DC | PRN

## 2020-12-26 MED ORDER — MIDODRINE HCL 5 MG PO TABS
10.0000 mg | ORAL_TABLET | Freq: Three times a day (TID) | ORAL | Status: DC
  Administered 2020-12-26 – 2021-01-05 (×22): 10 mg via ORAL
  Filled 2020-12-26 (×22): qty 2

## 2020-12-26 MED ORDER — MAGNESIUM SULFATE 4 GM/100ML IV SOLN
4.0000 g | Freq: Once | INTRAVENOUS | Status: AC
  Administered 2020-12-26: 4 g via INTRAVENOUS
  Filled 2020-12-26: qty 100

## 2020-12-26 NOTE — Progress Notes (Signed)
[facility]  ROUNDING NOTE   Subjective:    Denies any acute complaints States that appetite is low Denies any acute shortness of breath  Objective:  Vital signs in last 24 hours:  Temp:  [97.5 F (36.4 C)-98.1 F (36.7 C)] 97.5 F (36.4 C) (01/31 0900) Pulse Rate:  [25-152] 80 (01/31 0900) Resp:  [14-20] 17 (01/31 0900) BP: (46-129)/(35-99) 103/64 (01/31 0900) SpO2:  [93 %-100 %] 100 % (01/31 0900)  Weight change:  Filed Weights   12/21/20 0112 12/22/20 0427 12/23/20 0553  Weight: 109.2 kg (P) 110 kg 110.8 kg    Intake/Output: I/O last 3 completed shifts: In: 1010.8 [P.O.:600; I.V.:32.8; IV Piggyback:378] Out: 840 [Other:840]   Intake/Output this shift:  No intake/output data recorded.  Physical Exam: General: NAD, laying in bed  Head: Anicteric. Moist oral mucosal membranes  Lungs:   Coarse breath sounds  Heart: Regular rate and rhythm  Abdomen:  Soft, nontender, obese  Extremities:  left gangrene of fourth and fifth toe  Neurologic: Nonfocal, moving all four extremities  Skin: No lesions  Access: RIJ permcath       Basic Metabolic Panel: Recent Labs  Lab 12/20/20 0328 12/21/20 0440 12/22/20 0441 12/23/20 0641 12/24/20 0820 12/25/20 0700 12/26/20 0025 12/26/20 0600  NA 135   < > 135 138 138 135 135 134*  K 4.4   < > 5.3* 4.9 4.9 4.6 4.7 5.1  CL 95*   < > 97* 97* 97* 98 98 97*  CO2 24   < > '23 24 25 25 25 25  '$ GLUCOSE 107*   < > 75 130* 87 86 142* 107*  BUN 32*   < > 50* 35* 50* 37* 46* 50*  CREATININE 7.49*   < > 10.68* 8.33* 9.75* 7.45* 8.07* 9.01*  CALCIUM 8.5*   < > 8.9 8.4* 8.7* 8.4* 7.8* 8.5*  MG  --   --   --   --   --   --  1.6* 1.7  PHOS 4.7*  --  6.0*  --  6.1*  --  4.5 4.9*   < > = values in this interval not displayed.    Liver Function Tests: Recent Labs  Lab 12/20/20 0328 12/21/20 0440 12/24/20 0820  AST  --  32  --   ALT  --  27  --   ALKPHOS  --  56  --   BILITOT  --  0.7  --   PROT  --  6.2*  --    ALBUMIN 3.1* 3.1* 3.2*   No results for input(s): LIPASE, AMYLASE in the last 168 hours. No results for input(s): AMMONIA in the last 168 hours.  CBC: Recent Labs  Lab 12/21/20 0440 12/22/20 0441 12/23/20 0641 12/24/20 0820 12/25/20 0700 12/26/20 0600  WBC 15.9* 14.0* 22.7* 14.2* 12.8* 14.3*  NEUTROABS 10.2* 8.8* 18.8*  --  9.1* 10.9*  HGB 9.7* 10.1* 9.7* 8.9* 8.4* 8.0*  HCT 30.8* 31.1* 31.5* 28.2* 27.1* 25.5*  MCV 99.4 98.1 101.6* 101.1* 101.5* 100.4*  PLT 141* 147* 165 161 150 167    Cardiac Enzymes: No results for input(s): CKTOTAL, CKMB, CKMBINDEX, TROPONINI in the last 168 hours.  BNP: Invalid input(s): POCBNP  CBG: Recent Labs  Lab 12/25/20 2141 12/26/20 1115  [place] 90 110*    Microbiology: Results for orders placed or performed during the hospital encounter of 12/14/20  Blood culture (routine x 2)     Status: None   Collection Time: 12/14/20  2:18 PM   Specimen: Right Antecubital; Blood  Result Value Ref Range Status   Specimen Description RIGHT ANTECUBITAL  Final   Special Requests   Final    BOTTLES DRAWN AEROBIC AND ANAEROBIC Blood Culture results may not be optimal due to an inadequate volume of blood received in culture bottles   Culture   Final    NO GROWTH 5 DAYS Performed at [facility], [address] [place], [place] [address]    Report Status 12/19/2020 FINAL  Final  Resp Panel by RT-PCR (Flu A&B, Covid) Nasopharyngeal Swab     Status: Abnormal   Collection Time: 12/14/20  7:21 PM   Specimen: Nasopharyngeal Swab; Nasopharyngeal(NP) swabs in vial transport medium  Result Value Ref Range Status   SARS Coronavirus 2 by RT PCR POSITIVE (A) NEGATIVE Final    Comment: RESULT CALLED TO, READ BACK BY AND VERIFIED WITH: HARRY,A @ 2016 ON 12/14/20 BY JUW (NOTE) SARS-CoV-2 target nucleic acids are DETECTED.  The SARS-CoV-2 RNA is generally detectable in upper respiratory specimens during the acute phase of infection. Positive results  are indicative of the presence of the identified virus, but do not rule out bacterial infection or co-infection with other pathogens not detected by the test. Clinical correlation with patient history and other diagnostic information is necessary to determine patient infection status. The expected result is Negative.  Fact Sheet for Patients: EntrepreneurPulse.com.au  Fact Sheet for Healthcare Providers: IncredibleEmployment.be  This test is not yet approved or cleared by the [place] FDA and  has been authorized for detection and/or diagnosis of SARS-CoV-2 by FDA under an Emergency Use Authorization (EUA).  This EUA will remain in effect (meaning this test can be  used) for the duration of  the COVID-19 declaration under Section 564(b)(1) of the Act, 21 U.S.C. section 360bbb-3(b)(1), unless the authorization is terminated or revoked sooner.     Influenza A by PCR NEGATIVE NEGATIVE Final   Influenza B by PCR NEGATIVE NEGATIVE Final    Comment: (NOTE) The Xpert Xpress SARS-CoV-2/FLU/RSV plus assay is intended as an aid in the diagnosis of influenza from Nasopharyngeal swab specimens and should not be used as a sole basis for treatment. Nasal washings and aspirates are unacceptable for Xpert Xpress SARS-CoV-2/FLU/RSV testing.  Fact Sheet for Patients: EntrepreneurPulse.com.au  Fact Sheet for Healthcare Providers: IncredibleEmployment.be  This test is not yet approved or cleared by the [place] FDA and has been authorized for detection and/or diagnosis of SARS-CoV-2 by FDA under an Emergency Use Authorization (EUA). This EUA will remain in effect (meaning this test can be used) for the duration of the COVID-19 declaration under Section 564(b)(1) of the Act, 21 U.S.C. section 360bbb-3(b)(1), unless the authorization is terminated or revoked.  Performed at [facility], [address],  [place], [place] [address]   Blood culture (routine x 2)     Status: None   Collection Time: 12/14/20  9:25 PM   Specimen: Neck; Blood  Result Value Ref Range Status   Specimen Description NECK  Final   Special Requests   Final    BOTTLES DRAWN AEROBIC AND ANAEROBIC Blood Culture adequate volume   Culture   Final    NO GROWTH 5 DAYS Performed at [facility], [address] [place],  [address]    Report Status 12/19/2020 FINAL  Final  Aerobic Culture (superficial specimen)     Status: None   Collection Time: 12/18/20  8:38 AM   Specimen: Wound  Result Value Ref  Range Status   Specimen Description   Final    WOUND Performed at [facility], [address], [place], [place] [address]    Special Requests   Final    LEFT FOOT Performed at [facility], [address], [place], [place] [address]    Gram Stain   Final    RARE WBC PRESENT, PREDOMINANTLY MONONUCLEAR FEW GRAM POSITIVE COCCI RARE YEAST Performed at [facility], [place] [address], [place],  [address]    Culture   Final    MODERATE ENTEROCOCCUS FAECALIS MODERATE PROTEUS VULGARIS    Report Status 12/22/2020 FINAL  Final   Organism ID, Bacteria ENTEROCOCCUS FAECALIS  Final   Organism ID, Bacteria PROTEUS VULGARIS  Final      Susceptibility   Enterococcus faecalis - MIC*    AMPICILLIN <=2 SENSITIVE Sensitive     VANCOMYCIN 2 SENSITIVE Sensitive     GENTAMICIN SYNERGY SENSITIVE Sensitive     * MODERATE ENTEROCOCCUS FAECALIS   Proteus vulgaris - MIC*    AMPICILLIN >=32 RESISTANT Resistant     CEFAZOLIN >=64 RESISTANT Resistant     CEFEPIME <=0.12 SENSITIVE Sensitive     CEFTAZIDIME <=1 SENSITIVE Sensitive     CIPROFLOXACIN <=0.25 SENSITIVE Sensitive     GENTAMICIN <=1 SENSITIVE Sensitive     IMIPENEM 4 SENSITIVE Sensitive     TRIMETH/SULFA <=20 SENSITIVE Sensitive     AMPICILLIN/SULBACTAM 8 SENSITIVE Sensitive     PIP/TAZO <=4 SENSITIVE Sensitive     * MODERATE PROTEUS VULGARIS    Coagulation  Studies: No results for input(s): LABPROT, INR in the last 72 hours.  Urinalysis: No results for input(s): COLORURINE, LABSPEC, PHURINE, GLUCOSEU, HGBUR, BILIRUBINUR, KETONESUR, PROTEINUR, UROBILINOGEN, NITRITE, LEUKOCYTESUR in the last 72 hours.  Invalid input(s): APPERANCEUR    Imaging: [place] EKG SITE RITE  Result Date: 12/25/2020 If Site Rite image not attached, placement could not be confirmed due to current cardiac rhythm.    Medications:   . sodium chloride Stopped (12/26/20 0000)  . diltiazem (CARDIZEM) infusion 5 mg/hr (12/26/20 0600)  . phenylephrine (NEO-SYNEPHRINE) Adult infusion Stopped (12/26/20 0000)  . piperacillin-tazobactam (ZOSYN)  IV 100 mL/hr at 12/26/20 0600   . aspirin EC  81 mg Oral Daily  . carvedilol  3.125 mg Oral BID WC  . Chlorhexidine Gluconate Cloth  6 each Topical Daily  . epoetin (EPOGEN/PROCRIT) injection  8,000 Units Intravenous Q T,Th,Sa-HD  . gabapentin  100 mg Oral QHS  . heparin  5,000 Units Subcutaneous Q8H  . levothyroxine  300 mcg Oral QHS  . pantoprazole  40 mg Oral QHS  . sevelamer carbonate  3,200 mg Oral TID WC  . sodium chloride flush  10-40 mL Intracatheter Q12H  . ticagrelor  90 mg Oral BID   acetaminophen, albuterol, HYDROcodone-acetaminophen, morphine injection, ondansetron **OR** ondansetron (ZOFRAN) IV, senna-docusate, sevelamer carbonate, sodium chloride flush  Assessment/ Plan:  Ms. Lori BENEDEK is a 50 y.o. white female with end stage renal disease on hemodialysis, coronary artery disease, hypertension, peripheral vascular disease, hypothyroidism, who was admitted to [facility] on 12/20/2020 for Cellulitis of fourth toe of left foot [L03.032]  Patient initially admitted to [facility]. Patient transferred to [facility] for vascular services. Patient with incidental COVID diagnosis.   [facility]) MWF [facility] RIJ permcath 101.5kg  1. End Stage Renal Disease: Keep on TTS schedule due to COVID  diagnosis. Dialysis was planned for today but was deferred due to low blood pressure in the 60s We will  reassess tomorrow  2.  Covid-19 infection Tested positive 1/19 Currently maintained on isolation  3. Anemia of chronic kidney disease:   -  Lab Results  Component Value Date   HGB 8.0 (L) 12/26/2020   EPO with HD  4. Secondary Hyperparathyroidism:  - Sevelamer with meals  5. Peripheral vascular disease: with ischemic changes. Angiogram on 1/27 by Dr. Lucky Cowboy with balloon angioplasty, thrombectomy and two stents placed to SFA and left popliteal.  - Appreciate vascular input.  - ticagrelor   LOS: 6 Analyn Matusek 1/31/202211:39 AM

## 2020-12-26 NOTE — Progress Notes (Signed)
Per Dr. Corbin Ade hold PICC placement for now due to old grafts/fistulas. Please re-order if you would like for the PICC Team to try later

## 2020-12-26 NOTE — Progress Notes (Signed)
[facility] hospitalist acceptance note  Patient is a 51 year old female history significant for CAD status post PCI in 2020, and ESRD on hemodialysis, hypertension, hypothyroid initially admitted to [facility] for left foot wound and was transferred to [facility] for surgical evaluation.  At [facility] she was found to be incidentally Covid positive.  Received 3 days of IV remdesivir.  On 12/25/2020 the patient was found to be in rapid atrial fibrillation with ventricular rate 160s and borderline hypotension.  Rate control was attempted however patient had a drop in systolic blood pressure down in the 40s and 50s.  Was transferred to the intensive care unit and was briefly on Levophed.  Blood pressures and improved and patient was weaned off pressors.  At time of this note she remains in atrial fibrillation on diltiazem infusion.  Minimal oxygen requirement.  Covid positive.  Hemodynamically stable.  TR[facility] service to assume primary care of this patient on 12/27/2020 Signout received via secure chat from [facility] attending  Ralene Muskrat MD

## 2020-12-26 NOTE — Progress Notes (Signed)
PODIATRY / FOOT AND ANKLE SURGERY CONSULTATION NOTE  Requesting Physician:  Dr. Lucky Cowboy  Reason for consult: Forefoot gangrene bilateral  Chief Complaint: Forefoot gangrene/nonhealing wound   HPI: Lori [name] is a 50 y.o. female who presents today resting in bed comfortably.  Patient was transmitted to the ICU yesterday due to hypotension and uncontrolled A. fib.  Patient is currently feeling more comfortable and resting sitting up in bed.  Patient currently does not have any dressings on her feet despite orders.  Patient complains of wraps on her feet or anything that touches her feet.  Patient was going to go for partial amputation of the left fourth and fifth rays last Saturday but also became hypotensive prior to the procedure so the procedure was aborted.  PMHx:  Past Medical History:  Diagnosis Date  . Anemia   . ASCVD (arteriosclerotic cardiovascular disease)    a. s/p prior stenting of LAD b. NSTEMI in 01/2018 requiring DES x3 to RCA given spiral dissection from ostium to mid-RCA but residual disease along distal RCA, LAD and 2nd Mrg  . Calciphylaxis   . Chronic abdominal wound infection   . Dialysis patient ([place])   . ESRD (end stage renal disease) ([place])    Due to membranous GN dialysis 09/1996; peritoneal dialysis --? peitonitis; difficult vascular access  . GERD (gastroesophageal reflux disease)   . Hashimoto thyroiditis   . Hyperparathyroidism   . Hypertension   . Hypothyroidism   . Medically noncompliant   . Morbid obesity ([place])     Surgical Hx:  Past Surgical History:  Procedure Laterality Date  . Media  . CORONARY STENT INTERVENTION N/A 10/21/2019   Procedure: CORONARY STENT INTERVENTION;  Surgeon: Wellington Hampshire, MD;  Location: [facility];  Service: Cardiovascular;  Laterality: N/A;  . LEFT HEART CATH AND CORONARY ANGIOGRAPHY N/A 02/11/2018   Procedure: LEFT HEART CATH AND CORONARY ANGIOGRAPHY;  Surgeon: Charolette Forward, MD;  Location: [facility];   Service: Cardiovascular;  Laterality: N/A;  . LEFT HEART CATH AND CORONARY ANGIOGRAPHY N/A 10/14/2019   Procedure: LEFT HEART CATH AND CORONARY ANGIOGRAPHY;  Surgeon: Wellington Hampshire, MD;  Location: [facility];  Service: Cardiovascular;  Laterality: N/A;  . LOWER EXTREMITY ANGIOGRAPHY Left 12/22/2020   Procedure: Lower Extremity Angiography;  Surgeon: Algernon Huxley, MD;  Location: [facility];  Service: Cardiovascular;  Laterality: Left;  . THYROIDECTOMY, PARTIAL     Resectin of left lobe with reimplantation in the forearm, small focus of papillary carcinoma incidentally noted at pathology - 2000 and Hashimoto's thyrdoiditis in 2001  . TONSILLECTOMY AND ADENOIDECTOMY      FHx:  Family History  Problem Relation Age of Onset  . Coronary artery disease Mother   . Kidney disease Father   . Diabetes Sister     Social History:  reports that she has quit smoking. She has never used smokeless tobacco. She reports that she does not drink alcohol and does not use drugs.  Allergies:  Allergies  Allergen Reactions  . Activase [Alteplase] Shortness Of Breath  . Bee Pollen Anaphylaxis  . Warfarin Sodium Nausea And Vomiting and Rash    Review of Systems: General ROS: negative Respiratory ROS: no cough, shortness of breath, or wheezing Cardiovascular ROS: no chest pain or dyspnea on exertion Gastrointestinal ROS: no abdominal pain, change in bowel habits, or black or bloody stools Musculoskeletal ROS: positive for - joint pain, joint stiffness and joint swelling Neurological ROS:  positive for - numbness/tingling Dermatological ROS: positive for Gangrenous necrosis right fifth metatarsal phalangeal joint, left fourth and fifth metatarsal phalangeal joints and digits  Medications Prior to Admission  Medication Sig Dispense Refill  . albuterol (PROVENTIL HFA;VENTOLIN HFA) 108 (90 BASE) MCG/ACT inhaler Inhale 2 puffs into the lungs every 6 (six) hours as needed. Shortness of  breath    . aspirin EC 81 MG tablet Take 81 mg by mouth daily. Swallow whole.    . b complex-vitamin c-folic acid (NEPHRO-VITE) 0.8 MG TABS tablet Take 1 tablet by mouth daily.    . calcitRIOL (ROCALTROL) 0.25 MCG capsule Take 3 capsules (0.75 mcg total) by mouth every Monday, Wednesday, and Friday with hemodialysis.    Marland Kitchen ceFEPIme 2 g in sodium chloride 0.9 % 100 mL Inject 2 g into the vein every Monday, Wednesday, and Friday with hemodialysis.    Marland Kitchen gabapentin (NEURONTIN) 100 MG capsule Take 1 capsule (100 mg total) by mouth at bedtime.    Marland Kitchen HYDROcodone-acetaminophen (NORCO/VICODIN) 5-325 MG tablet TAKE (1) TABLET BY MOUTH EVERY (6) HOURS AS NEEDED. 18 tablet 0  . levothyroxine (SYNTHROID, LEVOTHROID) 300 MCG tablet Take 300 mcg by mouth at bedtime. SYNTHROID ONLY-BRAND NAME MEDICALLY NECESSARY    . metroNIDAZOLE (FLAGYL) 500 MG tablet Take 1 tablet (500 mg total) by mouth every 8 (eight) hours.    . midodrine (PROAMATINE) 10 MG tablet Take 1 tablet (10 mg total) by mouth 3 (three) times daily with meals.    . nitroGLYCERIN (NITROSTAT) 0.4 MG SL tablet Place 1 tablet (0.4 mg total) under the tongue every 5 (five) minutes x 3 doses as needed for chest pain. 25 tablet 12  . nystatin (MYCOSTATIN/NYSTOP) powder Apply topically 2 (two) times daily. 15 g 0  . pantoprazole (PROTONIX) 40 MG tablet Take 1 tablet (40 mg total) by mouth at bedtime.    . polyethylene glycol (MIRALAX / GLYCOLAX) 17 g packet Take 17 g by mouth 2 (two) times daily. 14 each 0  . sevelamer carbonate (RENVELA) 800 MG tablet Take 1,600-3,200 mg by mouth 3 (three) times daily with meals. '3200mg'$  with meals and '1600mg'$  with snacks    . ticagrelor (BRILINTA) 90 MG TABS tablet Take 1 tablet (90 mg total) by mouth 2 (two) times daily. 60 tablet 3  . vancomycin (VANCOCIN) 1-5 GM/200ML-% SOLN Inject 200 mLs (1,000 mg total) into the vein every Monday, Wednesday, and Friday with hemodialysis. 4000 mL   . EPINEPHrine 0.3 mg/0.3 mL IJ SOAJ  injection Inject 0.3 mg into the muscle as needed.    . fentaNYL (SUBLIMAZE) 100 MCG/2ML injection Inject 0.25 mLs (12.5 mcg total) into the vein every 3 (three) hours as needed for severe pain. 2 mL 0    Physical Exam: General: Alert and oriented.  No apparent distress.  Vascular: DP/PT pulses nonpalpable bilateral.  Both feet do appear to be warm to touch bilateral.  Capillary fill time appears to be delayed to the right fifth ray at the fifth metatarsal phalangeal joint dorsal lateral and to the left fourth and fifth toes.  Neuro: Light touch sensation reduced to both feet.  Derm: Gangrenous necrosis that appears to be dry to the left fourth and fifth toes, no bone exposed at this time, appears to be dry, minimal to no drainage noted, mild corresponding cellulitic changes to the area.  Mild odor.  Dry gangrene present to the right fifth metatarsal phalangeal joint dorsal lateral aspect.  Capillary fill time appears to be delayed to this  area, mild serous drainage, no erythema, no edema, no odor.  Appears to be callusing or scabbing over at this time.  MSK: Mild pain on palpation to the left and right forefoot.  Results for orders placed or performed during the hospital encounter of 12/20/20 (from the past 48 hour(s))  Hepatitis B surface antigen     Status: None   Collection Time: 12/25/20  7:00 AM  Result Value Ref Range   Hepatitis B Surface Ag NON REACTIVE NON REACTIVE    Comment: Performed at [facility], [address], [place], [place] [address]  CBC with Differential/Platelet     Status: Abnormal   Collection Time: 12/25/20  7:00 AM  Result Value Ref Range   WBC 12.8 (H) 4.0 - 10.5 K/uL   RBC 2.67 (L) 3.87 - 5.11 MIL/uL   Hemoglobin 8.4 (L) 12.0 - 15.0 g/dL   HCT 27.1 (L) 36.0 - 46.0 %   MCV 101.5 (H) 80.0 - 100.0 fL   MCH 31.5 26.0 - 34.0 pg   MCHC 31.0 30.0 - 36.0 g/dL   RDW 16.2 (H) 11.5 - 15.5 %   Platelets 150 150 - 400 K/uL   nRBC 0.0 0.0 - 0.2 %    Neutrophils Relative % 71 %   Neutro Abs 9.1 (H) 1.7 - 7.7 K/uL   Lymphocytes Relative 9 %   Lymphs Abs 1.1 0.7 - 4.0 K/uL   Monocytes Relative 11 %   Monocytes Absolute 1.4 (H) 0.1 - 1.0 K/uL   Eosinophils Relative 1 %   Eosinophils Absolute 0.2 0.0 - 0.5 K/uL   Basophils Relative 1 %   Basophils Absolute 0.1 0.0 - 0.1 K/uL   WBC Morphology MILD LEFT SHIFT (1-5% METAS, OCC MYELO, OCC BANDS)    RBC Morphology MORPHOLOGY UNREMARKABLE    Smear Review Normal platelet morphology    Immature Granulocytes 7 %   Abs Immature Granulocytes 0.90 (H) 0.00 - 0.07 K/uL    Comment: Performed at [facility], [address], [place], [place] [id number]  Basic metabolic panel     Status: Abnormal   Collection Time: 12/25/20  7:00 AM  Result Value Ref Range   Sodium 135 135 - 145 mmol/L   Potassium 4.6 3.5 - 5.1 mmol/L   Chloride 98 98 - 111 mmol/L   CO2 25 22 - 32 mmol/L   Glucose, Bld 86 70 - 99 mg/dL    Comment: Glucose reference range applies only to samples taken after fasting for at least 8 hours.   BUN 37 (H) 6 - 20 mg/dL   Creatinine, Ser 7.45 (H) 0.44 - 1.00 mg/dL   Calcium 8.4 (L) 8.9 - 10.3 mg/dL   GFR, Estimated 6 (L) >60 mL/min    Comment: (NOTE) Calculated using the CKD-EPI Creatinine Equation (2021)    Anion gap 12 5 - 15    Comment: Performed at [facility], [address] [place], [place] [address]  Lactic acid, plasma     Status: None   Collection Time: 12/25/20  9:40 PM  Result Value Ref Range   Lactic Acid, Venous 1.0 0.5 - 1.9 mmol/L    Comment: Performed at [facility], [address] [place], [place] [address]  Glucose, capillary     Status: None   Collection Time: 12/25/20  9:41 PM  Result Value Ref Range   Glucose-Capillary 90 70 - 99 mg/dL    Comment: Glucose reference range applies only to samples taken after  fasting for at least 8 hours.  Basic metabolic panel     Status: Abnormal   Collection Time: 12/26/20 12:25 AM  Result  Value Ref Range   Sodium 135 135 - 145 mmol/L   Potassium 4.7 3.5 - 5.1 mmol/L   Chloride 98 98 - 111 mmol/L   CO2 25 22 - 32 mmol/L   Glucose, Bld 142 (H) 70 - 99 mg/dL    Comment: Glucose reference range applies only to samples taken after fasting for at least 8 hours.   BUN 46 (H) 6 - 20 mg/dL   Creatinine, Ser 8.07 (H) 0.44 - 1.00 mg/dL   Calcium 7.8 (L) 8.9 - 10.3 mg/dL   GFR, Estimated 6 (L) >60 mL/min    Comment: (NOTE) Calculated using the CKD-EPI Creatinine Equation (2021)    Anion gap 12 5 - 15    Comment: Performed at [facility], [address] [place], [place] [address]  Magnesium     Status: Abnormal   Collection Time: 12/26/20 12:25 AM  Result Value Ref Range   Magnesium 1.6 (L) 1.7 - 2.4 mg/dL    Comment: Performed at [facility], [address] [place], [place] [address]  Phosphorus     Status: None   Collection Time: 12/26/20 12:25 AM  Result Value Ref Range   Phosphorus 4.5 2.5 - 4.6 mg/dL    Comment: Performed at [facility], [address] [place], [place] [address]  CBC with Differential/Platelet     Status: Abnormal   Collection Time: 12/26/20  6:00 AM  Result Value Ref Range   WBC 14.3 (H) 4.0 - 10.5 K/uL   RBC 2.54 (L) 3.87 - 5.11 MIL/uL   Hemoglobin 8.0 (L) 12.0 - 15.0 g/dL   HCT 25.5 (L) 36.0 - 46.0 %   MCV 100.4 (H) 80.0 - 100.0 fL   MCH 31.5 26.0 - 34.0 pg   MCHC 31.4 30.0 - 36.0 g/dL   RDW 16.2 (H) 11.5 - 15.5 %   Platelets 167 150 - 400 K/uL   nRBC 0.0 0.0 - 0.2 %   Neutrophils Relative % 77 %   Neutro Abs 10.9 (H) 1.7 - 7.7 K/uL   Lymphocytes Relative 6 %   Lymphs Abs 0.9 0.7 - 4.0 K/uL   Monocytes Relative 10 %   Monocytes Absolute 1.5 (H) 0.1 - 1.0 K/uL   Eosinophils Relative 1 %   Eosinophils Absolute 0.2 0.0 - 0.5 K/uL   Basophils Relative 1 %   Basophils Absolute 0.1 0.0 - 0.1 K/uL   Immature Granulocytes 5 %   Abs Immature Granulocytes 0.76 (H) 0.00 - 0.07 K/uL    Comment: Performed at  [facility], [address] [place], [place] [id number]  Basic metabolic panel     Status: Abnormal   Collection Time: 12/26/20  6:00 AM  Result Value Ref Range   Sodium 134 (L) 135 - 145 mmol/L   Potassium 5.1 3.5 - 5.1 mmol/L   Chloride 97 (L) 98 - 111 mmol/L   CO2 25 22 - 32 mmol/L   Glucose, Bld 107 (H) 70 - 99 mg/dL    Comment: Glucose reference range applies only to samples taken after fasting for at least 8 hours.   BUN 50 (H) 6 - 20 mg/dL   Creatinine, Ser 9.01 (H) 0.44 - 1.00 mg/dL   Calcium 8.5 (L) 8.9 - 10.3 mg/dL   GFR, Estimated 5 (L) >60 mL/min    Comment: (NOTE)  Calculated using the CKD-EPI Creatinine Equation (2021)    Anion gap 12 5 - 15    Comment: Performed at [facility], [address] [place], [place] [address]  Magnesium     Status: None   Collection Time: 12/26/20  6:00 AM  Result Value Ref Range   Magnesium 1.7 1.7 - 2.4 mg/dL    Comment: Performed at [facility], [address] [place],  [address]  Phosphorus     Status: Abnormal   Collection Time: 12/26/20  6:00 AM  Result Value Ref Range   Phosphorus 4.9 (H) 2.5 - 4.6 mg/dL    Comment: Performed at [facility], [address] [place],  [address]  Glucose, capillary     Status: Abnormal   Collection Time: 12/26/20 11:15 AM  Result Value Ref Range   Glucose-Capillary 110 (H) 70 - 99 mg/dL    Comment: Glucose reference range applies only to samples taken after fasting for at least 8 hours.   [place] EKG SITE RITE  Result Date: 12/25/2020 If Site Rite image not attached, placement could not be confirmed due to current cardiac rhythm.   Blood pressure 103/64, pulse 80, temperature (!) 97.5 F (36.4 C), temperature source Oral, resp. rate 17, height '5\' 3"'$  (1.6 m), weight 110.8 kg, SpO2 100 %.   Assessment 1. Left forefoot dry gangrene involving the fourth and fifth metatarsal phalangeal joints and digits 2. Right fifth metatarsal phalangeal  joint dry gangrene 3. PVD  Plan -Patient seen and evaluated. -Patient appears to have stable dry gangrene to the left fourth and fifth toes and metatarsal phalangeal joints and right fifth metatarsal phalangeal joint.  -Applied Betadine dressings to both.  Patient should have dressings daily until surgery. -Appreciate vascular recommendations.  Have asked vascular surgery to also assess right lower extremity due to new gangrenous changes to the fifth metatarsal phalangeal joint. -Appreciate medicine recommendations for antibiotic therapy. -We will hold off on surgery now for both feet until further evaluated by vascular surgery and also until medically cleared for surgery per medicine standpoint.  We will continue to reassess.  Currently gangrenous areas do appear to be very stable.  Caroline More, DPM 12/26/2020, 12:52 PM

## 2020-12-27 ENCOUNTER — Encounter: Payer: Self-pay | Admitting: Internal Medicine

## 2020-12-27 DIAGNOSIS — I739 Peripheral vascular disease, unspecified: Secondary | ICD-10-CM

## 2020-12-27 DIAGNOSIS — I2510 Atherosclerotic heart disease of native coronary artery without angina pectoris: Secondary | ICD-10-CM

## 2020-12-27 LAB — GLUCOSE, CAPILLARY: Glucose-Capillary: 91 mg/dL (ref 70–99)

## 2020-12-27 LAB — HEPATITIS B SURFACE ANTIBODY, QUANTITATIVE: Hep B S AB Quant (Post): 83 m[IU]/mL (ref >9.9)

## 2020-12-27 LAB — TSH: TSH: 78.253 u[IU]/mL — ABNORMAL HIGH (ref 0.350–4.500)

## 2020-12-27 MED ORDER — HEPARIN BOLUS VIA INFUSION
4000.0000 [IU] | Freq: Once | INTRAVENOUS | Status: AC
  Administered 2020-12-27: 4000 [IU] via INTRAVENOUS
  Filled 2020-12-27: qty 4000

## 2020-12-27 MED ORDER — HEPARIN (PORCINE) 25000 UT/250ML-% IV SOLN
1850.0000 [IU]/h | INTRAVENOUS | Status: DC
  Administered 2020-12-27: 1000 [IU]/h via INTRAVENOUS
  Administered 2020-12-28: 1250 [IU]/h via INTRAVENOUS
  Administered 2020-12-29: 1600 [IU]/h via INTRAVENOUS
  Administered 2020-12-30: 1750 [IU]/h via INTRAVENOUS
  Filled 2020-12-27 (×4): qty 250

## 2020-12-27 MED ORDER — METOPROLOL TARTRATE 25 MG PO TABS
12.5000 mg | ORAL_TABLET | Freq: Four times a day (QID) | ORAL | Status: DC
  Administered 2020-12-28 – 2021-01-01 (×9): 12.5 mg via ORAL
  Filled 2020-12-27 (×10): qty 1

## 2020-12-27 MED ORDER — SODIUM CHLORIDE 0.9 % IV SOLN
3.0000 g | Freq: Two times a day (BID) | INTRAVENOUS | Status: AC
  Administered 2020-12-27 – 2020-12-30 (×6): 3 g via INTRAVENOUS
  Filled 2020-12-27 (×2): qty 2.14
  Filled 2020-12-27: qty 8
  Filled 2020-12-27: qty 2.14
  Filled 2020-12-27: qty 3
  Filled 2020-12-27: qty 8
  Filled 2020-12-27: qty 2.14

## 2020-12-27 NOTE — Progress Notes (Signed)
[facility]  ROUNDING NOTE   Subjective:    Denies any acute complaints Denies any acute shortness of breath No pressors Just ate breakfast HR in 130's No chest pain  Objective:  Vital signs in last 24 hours:  Temp:  [96.7 F (35.9 C)-96.8 F (36 C)] 96.7 F (35.9 C) (02/01 0440) Pulse Rate:  [25-100] 25 (02/01 0625) Resp:  [14-19] 15 (02/01 0625) BP: (73-111)/(45-92) 81/60 (02/01 0625) SpO2:  [89 %-100 %] 89 % (02/01 0625)  Weight change:  Filed Weights   12/21/20 0112 12/22/20 0427 12/23/20 0553  Weight: 109.2 kg (P) 110 kg 110.8 kg    Intake/Output: I/O last 3 completed shifts: In: 375.6 [P.O.:200; I.V.:42.8; IV Piggyback:132.8] Out: -    Intake/Output this shift:  No intake/output data recorded.  Physical Exam: General: NAD, laying in bed  Head: Anicteric. Moist oral mucosal membranes  Lungs:   Coarse breath sounds  Heart: Irregular tachycardic  Abdomen:  Soft, nontender, obese  Extremities:  both feet bandaged  Neurologic:  alert, oriented  Access: RIJ permcath       Basic Metabolic Panel: Recent Labs  Lab 12/22/20 0441 12/23/20 0641 12/24/20 0820 12/25/20 0700 12/26/20 0025 12/26/20 0600  NA 135 138 138 135 135 134*  K 5.3* 4.9 4.9 4.6 4.7 5.1  CL 97* 97* 97* 98 98 97*  CO2 '23 24 25 25 25 25  '$ GLUCOSE 75 130* 87 86 142* 107*  BUN 50* 35* 50* 37* 46* 50*  CREATININE 10.68* 8.33* 9.75* 7.45* 8.07* 9.01*  CALCIUM 8.9 8.4* 8.7* 8.4* 7.8* 8.5*  MG  --   --   --   --  1.6* 1.7  PHOS 6.0*  --  6.1*  --  4.5 4.9*    Liver Function Tests: Recent Labs  Lab 12/21/20 0440 12/24/20 0820  AST 32  --   ALT 27  --   ALKPHOS 56  --   BILITOT 0.7  --   PROT 6.2*  --   ALBUMIN 3.1* 3.2*   No results for input(s): LIPASE, AMYLASE in the last 168 hours. No results for input(s): AMMONIA in the last 168 hours.  CBC: Recent Labs  Lab 12/21/20 0440 12/22/20 0441 12/23/20 0641 12/24/20 0820 12/25/20 0700 12/26/20 0600  WBC  15.9* 14.0* 22.7* 14.2* 12.8* 14.3*  NEUTROABS 10.2* 8.8* 18.8*  --  9.1* 10.9*  HGB 9.7* 10.1* 9.7* 8.9* 8.4* 8.0*  HCT 30.8* 31.1* 31.5* 28.2* 27.1* 25.5*  MCV 99.4 98.1 101.6* 101.1* 101.5* 100.4*  PLT 141* 147* 165 161 150 167    Cardiac Enzymes: No results for input(s): CKTOTAL, CKMB, CKMBINDEX, TROPONINI in the last 168 hours.  BNP: Invalid input(s): POCBNP  CBG: Recent Labs  Lab 12/25/20 2141 12/26/20 0749 12/26/20 1115 12/27/20 0726  GLUCAP 90 81 110* 27    Microbiology: Results for orders placed or performed during the hospital encounter of 12/14/20  Blood culture (routine x 2)     Status: None   Collection Time: 12/14/20  2:18 PM   Specimen: Right Antecubital; Blood  Result Value Ref Range Status   Specimen Description RIGHT ANTECUBITAL  Final   Special Requests   Final    BOTTLES DRAWN AEROBIC AND ANAEROBIC Blood Culture results may not be optimal due to an inadequate volume of blood received in culture bottles   Culture   Final    NO GROWTH 5 DAYS Performed at [facility], [address], [place], [place] [address]    Report  Status 12/19/2020 FINAL  Final  Resp Panel by RT-PCR (Flu A&B, Covid) Nasopharyngeal Swab     Status: Abnormal   Collection Time: 12/14/20  7:21 PM   Specimen: Nasopharyngeal Swab; Nasopharyngeal(NP) swabs in vial transport medium  Result Value Ref Range Status   SARS Coronavirus 2 by RT PCR POSITIVE (A) NEGATIVE Final    Comment: RESULT CALLED TO, READ BACK BY AND VERIFIED WITH: HARRY,A @ 2016 ON 12/14/20 BY JUW (NOTE) SARS-CoV-2 target nucleic acids are DETECTED.  The SARS-CoV-2 RNA is generally detectable in upper respiratory specimens during the acute phase of infection. Positive results are indicative of the presence of the identified virus, but do not rule out bacterial infection or co-infection with other pathogens not detected by the test. Clinical correlation with patient history and other diagnostic information is  necessary to determine patient infection status. The expected result is Negative.  Fact Sheet for Patients: EntrepreneurPulse.com.au  Fact Sheet for Healthcare Providers: IncredibleEmployment.be  This test is not yet approved or cleared by the [place] FDA and  has been authorized for detection and/or diagnosis of SARS-CoV-2 by FDA under an Emergency Use Authorization (EUA).  This EUA will remain in effect (meaning this test can be  used) for the duration of  the COVID-19 declaration under Section 564(b)(1) of the Act, 21 U.S.C. section 360bbb-3(b)(1), unless the authorization is terminated or revoked sooner.     Influenza A by PCR NEGATIVE NEGATIVE Final   Influenza B by PCR NEGATIVE NEGATIVE Final    Comment: (NOTE) The Xpert Xpress SARS-CoV-2/FLU/RSV plus assay is intended as an aid in the diagnosis of influenza from Nasopharyngeal swab specimens and should not be used as a sole basis for treatment. Nasal washings and aspirates are unacceptable for Xpert Xpress SARS-CoV-2/FLU/RSV testing.  Fact Sheet for Patients: EntrepreneurPulse.com.au  Fact Sheet for Healthcare Providers: IncredibleEmployment.be  This test is not yet approved or cleared by the [place] FDA and has been authorized for detection and/or diagnosis of SARS-CoV-2 by FDA under an Emergency Use Authorization (EUA). This EUA will remain in effect (meaning this test can be used) for the duration of the COVID-19 declaration under Section 564(b)(1) of the Act, 21 U.S.C. section 360bbb-3(b)(1), unless the authorization is terminated or revoked.  Performed at [facility], [address] [place], [place] [address]   Blood culture (routine x 2)     Status: None   Collection Time: 12/14/20  9:25 PM   Specimen: Neck; Blood  Result Value Ref Range Status   Specimen Description NECK  Final   Special Requests   Final    BOTTLES DRAWN  AEROBIC AND ANAEROBIC Blood Culture adequate volume   Culture   Final    NO GROWTH 5 DAYS Performed at [facility], [address] [place], [place] [address]    Report Status 12/19/2020 FINAL  Final  Aerobic Culture (superficial specimen)     Status: None   Collection Time: 12/18/20  8:38 AM   Specimen: Wound  Result Value Ref Range Status   Specimen Description   Final    WOUND Performed at [facility], [address], [place], [place] [address]    Special Requests   Final    LEFT FOOT Performed at [facility], [address] [place], [place] [address]    Gram Stain   Final    RARE WBC PRESENT, PREDOMINANTLY MONONUCLEAR FEW GRAM POSITIVE COCCI RARE YEAST Performed at [facility], [place] [address], [place], [place] [address]  Culture   Final    MODERATE ENTEROCOCCUS FAECALIS MODERATE PROTEUS VULGARIS    Report Status 12/22/2020 FINAL  Final   Organism ID, Bacteria ENTEROCOCCUS FAECALIS  Final   Organism ID, Bacteria PROTEUS VULGARIS  Final      Susceptibility   Enterococcus faecalis - MIC*    AMPICILLIN <=2 SENSITIVE Sensitive     VANCOMYCIN 2 SENSITIVE Sensitive     GENTAMICIN SYNERGY SENSITIVE Sensitive     * MODERATE ENTEROCOCCUS FAECALIS   Proteus vulgaris - MIC*    AMPICILLIN >=32 RESISTANT Resistant     CEFAZOLIN >=64 RESISTANT Resistant     CEFEPIME <=0.12 SENSITIVE Sensitive     CEFTAZIDIME <=1 SENSITIVE Sensitive     CIPROFLOXACIN <=0.25 SENSITIVE Sensitive     GENTAMICIN <=1 SENSITIVE Sensitive     IMIPENEM 4 SENSITIVE Sensitive     TRIMETH/SULFA <=20 SENSITIVE Sensitive     AMPICILLIN/SULBACTAM 8 SENSITIVE Sensitive     PIP/TAZO <=4 SENSITIVE Sensitive     * MODERATE PROTEUS VULGARIS    Coagulation Studies: No results for input(s): LABPROT, INR in the last 72 hours.  Urinalysis: No results for input(s): COLORURINE, LABSPEC, PHURINE, GLUCOSEU, HGBUR, BILIRUBINUR, KETONESUR, PROTEINUR, UROBILINOGEN, NITRITE, LEUKOCYTESUR in the last 72  hours.  Invalid input(s): APPERANCEUR    Imaging: [place] EKG SITE RITE  Result Date: 12/25/2020 If Site Rite image not attached, placement could not be confirmed due to current cardiac rhythm.    Medications:   . sodium chloride Stopped (12/26/20 0000)  . phenylephrine (NEO-SYNEPHRINE) Adult infusion Stopped (12/26/20 0000)  . piperacillin-tazobactam (ZOSYN)  IV 2.25 g (12/27/20 0619)   . aspirin EC  81 mg Oral Daily  . carvedilol  3.125 mg Oral BID WC  . Chlorhexidine Gluconate Cloth  6 each Topical Daily  . epoetin (EPOGEN/PROCRIT) injection  8,000 Units Intravenous Q T,Th,Sa-HD  . gabapentin  100 mg Oral QHS  . heparin  5,000 Units Subcutaneous Q8H  . levothyroxine  300 mcg Oral QHS  . midodrine  10 mg Oral TID WC  . pantoprazole  40 mg Oral QHS  . sevelamer carbonate  3,200 mg Oral TID WC  . sodium chloride flush  10-40 mL Intracatheter Q12H  . ticagrelor  90 mg Oral BID   acetaminophen, albuterol, HYDROcodone-acetaminophen, morphine injection, ondansetron **OR** ondansetron (ZOFRAN) IV, senna-docusate, sevelamer carbonate, sodium chloride flush  Assessment/ Plan:  Lori [name] is a 50 y.o. white female with end stage renal disease on hemodialysis, coronary artery disease, hypertension, peripheral vascular disease, hypothyroidism, who was admitted to [facility] on 12/20/2020 for Cellulitis of fourth toe of left foot [L03.032]  Patient initially admitted to [facility]. Patient transferred to [facility] for vascular services. Patient with incidental COVID diagnosis. 12/14/2020  [facility] MWF [facility] RIJ permcath 101.5kg  1. End Stage Renal Disease: Keep on TTS schedule due to COVID diagnosis. BP in low 90's. HR in 130's Patient asymptomatic Will try HD with slow BFR and careful monitoring No UF planned  2.  Covid-19 infection Tested positive 1/19 Currently maintained on isolation  3. Anemia of chronic kidney disease:   -  Lab Results   Component Value Date   HGB 8.0 (L) 12/26/2020   EPO with HD  4. Secondary Hyperparathyroidism:  - Sevelamer with meals  5. Peripheral vascular disease: with ischemic changes. Angiogram on 1/27 by Dr. Lucky Cowboy with balloon angioplasty, thrombectomy and two stents placed to SFA and left popliteal.  - ticagrelor -podiatry team also following  6. A Fib with RVR Plan as per IM team   LOS: 7 Nasirah Sachs 2/1/20229:44 AM

## 2020-12-27 NOTE — Progress Notes (Signed)
Difficult patient. Stated she was not diabetic, she did not have Covid-( she was tested at [facility] and it was negative-we are lying to her) and nothing is wrong with her heart!! She refused 2 food trays. Wants everything she eats at home-noncompliant with Renal diet. Did not allow me to examine her feet-it hurt. Finally turned off her butt at 1400. Received dialysis today- no fluid off.  B/P up and down.  Never the same. Remained alert oriented all day regardless of B/P.

## 2020-12-27 NOTE — Consult Note (Signed)
[place] [facility] for Heparin  Indication: atrial fibrillation  Allergies  Allergen Reactions  . Activase [Alteplase] Shortness Of Breath  . Bee Pollen Anaphylaxis  . Warfarin Sodium Nausea And Vomiting and Rash    Patient Measurements: Height: '5\' 3"'$  (160 cm) Weight: 110.8 kg (244 lb 4.3 oz) IBW/kg (Calculated) : 52.4 Heparin Dosing Weight: 76.7 kg  Vital Signs: Temp: 96.2 F (35.7 C) (02/01 1444) Temp Source: Oral (02/01 1206) BP: 102/80 (02/01 1430) Pulse Rate: 30 (02/01 1100)  Labs: Recent Labs    12/25/20 0700 12/26/20 0025 12/26/20 0600  HGB 8.4*  --  8.0*  HCT 27.1*  --  25.5*  PLT 150  --  167  CREATININE 7.45* 8.07* 9.01*    Estimated Creatinine Clearance: 8.8 mL/min (A) (by C-G formula based on SCr of 9.01 mg/dL (H)).   Medical History: Past Medical History:  Diagnosis Date  . Anemia   . ASCVD (arteriosclerotic cardiovascular disease)    a. s/p prior stenting of LAD b. NSTEMI in 01/2018 requiring DES x3 to RCA 2/2 spiral dissection from ost->mid RCA but residual dzs in dRCA, LAD & OM2; c. 09/2019 Cath/PCI: LM nl, LAD 50 ISR, 65 p/m, 77m(2.5x15 Resolute Onyx DES), 90d, LCX 99d, OM2 99 (2.25x26 Resolute Onyx DES), RCA 40p/244mSR, 85d, RPDA 100ost, RPAV 50. EF 50-55%.  . Calciphylaxis   . Chronic abdominal wound infection   . COVID-19 virus infection 11/2020  . Dialysis patient ([facility]  . Diastolic dysfunction    a. 11/2020 Echo: EF 55-60%, mod conc LVh, gr2 DD, nl RV size/fxn. Sev dil LA. Triv MR. Mod-Sev mitral annular Ca2+. Mild-mod Ao sclerosis w/o stenosis.  . Marland KitchenSRD (end stage renal disease) ([facility]   Due to membranous GN dialysis 09/1996; peritoneal dialysis --? peitonitis; difficult vascular access-->HD MWF.  . Mar[place] Kitchenangrene of left foot ([facility]   a. 11/2020 L forefoot dry gangrene involving 4th and 5th MTP joints & digits, & R 5th MTP joint.  . Marland KitchenERD (gastroesophageal reflux disease)   . Hashimoto thyroiditis   .  Hyperparathyroidism   . Hypertension   . Hypothyroidism   . Medically noncompliant   . Morbid obesity ([facility] [place]  . PAD (peripheral artery disease) ([facility]   a. 11/2020 PTA of L peroneal, PTA/thrombectomy, Viabahn stenting x 2 to the L SFA and popliteal arteries (6x25086m 6x150m50m . Persistent atrial fibrillation (HCC)[facility] a. Noted during hospitalization 11/2020.    Medications:  Medications Prior to Admission  Medication Sig Dispense Refill Last Dose  . albuterol (PROVENTIL HFA;VENTOLIN HFA) 108 (90 BASE) MCG/ACT inhaler Inhale 2 puffs into the lungs every 6 (six) hours as needed. Shortness of breath   Past Week at Unknown time  . aspirin EC 81 MG tablet Take 81 mg by mouth daily. Swallow whole.   12/20/2020 at Unknown time  . b complex-vitamin c-folic acid (NEPHRO-VITE) 0.8 MG TABS tablet Take 1 tablet by mouth daily.   12/20/2020 at Unknown time  . calcitRIOL (ROCALTROL) 0.25 MCG capsule Take 3 capsules (0.75 mcg total) by mouth every Monday, Wednesday, and Friday with hemodialysis.   12/20/2020 at Unknown time  . ceFEPIme 2 g in sodium chloride 0.9 % 100 mL Inject 2 g into the vein every Monday, Wednesday, and Friday with hemodialysis.   12/20/2020 at Unknown time  . gabapentin (NEURONTIN) 100 MG capsule Take 1 capsule (100 mg total) by mouth at bedtime.   12/19/2020 at Unknown time  .  HYDROcodone-acetaminophen (NORCO/VICODIN) 5-325 MG tablet TAKE (1) TABLET BY MOUTH EVERY (6) HOURS AS NEEDED. 18 tablet 0 12/20/2020 at Unknown time  . levothyroxine (SYNTHROID, LEVOTHROID) 300 MCG tablet Take 300 mcg by mouth at bedtime. SYNTHROID ONLY-BRAND NAME MEDICALLY NECESSARY   12/19/2020 at Unknown time  . metroNIDAZOLE (FLAGYL) 500 MG tablet Take 1 tablet (500 mg total) by mouth every 8 (eight) hours.   12/19/2020 at Unknown time  . midodrine (PROAMATINE) 10 MG tablet Take 1 tablet (10 mg total) by mouth 3 (three) times daily with meals.   Past Month at Unknown time  . nitroGLYCERIN (NITROSTAT) 0.4 MG SL  tablet Place 1 tablet (0.4 mg total) under the tongue every 5 (five) minutes x 3 doses as needed for chest pain. 25 tablet 12 Past Week at Unknown time  . nystatin (MYCOSTATIN/NYSTOP) powder Apply topically 2 (two) times daily. 15 g 0 12/20/2020 at Unknown time  . pantoprazole (PROTONIX) 40 MG tablet Take 1 tablet (40 mg total) by mouth at bedtime.   12/20/2020 at Unknown time  . polyethylene glycol (MIRALAX / GLYCOLAX) 17 g packet Take 17 g by mouth 2 (two) times daily. 14 each 0 12/19/2020 at Unknown time  . sevelamer carbonate (RENVELA) 800 MG tablet Take 1,600-3,200 mg by mouth 3 (three) times daily with meals. '3200mg'$  with meals and '1600mg'$  with snacks   12/20/2020 at Unknown time  . ticagrelor (BRILINTA) 90 MG TABS tablet Take 1 tablet (90 mg total) by mouth 2 (two) times daily. 60 tablet 3 12/20/2020 at Unknown time  . vancomycin (VANCOCIN) 1-5 GM/200ML-% SOLN Inject 200 mLs (1,000 mg total) into the vein every Monday, Wednesday, and Friday with hemodialysis. 4000 mL  12/19/2020 at Unknown time  . EPINEPHrine 0.3 mg/0.3 mL IJ SOAJ injection Inject 0.3 mg into the muscle as needed.     . fentaNYL (SUBLIMAZE) 100 MCG/2ML injection Inject 0.25 mLs (12.5 mcg total) into the vein every 3 (three) hours as needed for severe pain. 2 mL 0    Scheduled:  . aspirin EC  81 mg Oral Daily  . Chlorhexidine Gluconate Cloth  6 each Topical Daily  . epoetin (EPOGEN/PROCRIT) injection  8,000 Units Intravenous Q T,Th,Sa-HD  . gabapentin  100 mg Oral QHS  . levothyroxine  300 mcg Oral QHS  . metoprolol tartrate  12.5 mg Oral Q6H  . midodrine  10 mg Oral TID WC  . pantoprazole  40 mg Oral QHS  . sevelamer carbonate  3,200 mg Oral TID WC  . sodium chloride flush  10-40 mL Intracatheter Q12H  . ticagrelor  90 mg Oral BID   Infusions:  . sodium chloride Stopped (12/26/20 0000)  . ampicillin-sulbactam (UNASYN) IV     PRN: acetaminophen, albuterol, HYDROcodone-acetaminophen, morphine injection, ondansetron **OR**  ondansetron (ZOFRAN) IV, senna-docusate, sevelamer carbonate, sodium chloride flush Anti-infectives (From admission, onward)   Start     Dose/Rate Route Frequency Ordered Stop   12/27/20 1800  Ampicillin-Sulbactam (UNASYN) 3 g in sodium chloride 0.9 % 100 mL IVPB        3 g 200 mL/hr over 30 Minutes Intravenous Every 12 hours 12/27/20 1326 12/30/20 1759   12/22/20 1400  piperacillin-tazobactam (ZOSYN) IVPB 2.25 g  Status:  Discontinued        2.25 g 100 mL/hr over 30 Minutes Intravenous Every 8 hours 12/22/20 1314 12/27/20 1326   12/22/20 1330  ceFAZolin (ANCEF) IVPB 1 g/50 mL premix       Note to Pharmacy: To be given in  specials   1 g 100 mL/hr over 30 Minutes Intravenous 60 min pre-op 12/22/20 0052 12/22/20 1607   12/22/20 1257  ceFAZolin (ANCEF) 1-4 GM/50ML-% IVPB       Note to Pharmacy: Corlis Hove   : cabinet override      12/22/20 1257 12/22/20 1610   12/22/20 1200  vancomycin (VANCOCIN) IVPB 1000 mg/200 mL premix  Status:  Discontinued        1,000 mg 200 mL/hr over 60 Minutes Intravenous Every T-Th-Sa (Hemodialysis) 12/22/20 1128 12/22/20 1314   12/22/20 0145  ceFAZolin (ANCEF) IVPB 1 g/50 mL premix  Status:  Discontinued       Note to Pharmacy: To be given in specials   1 g 100 mL/hr over 30 Minutes Intravenous  Once 12/22/20 0048 12/22/20 0050   12/21/20 1200  ceFEPIme (MAXIPIME) 2 g in sodium chloride 0.9 % 100 mL IVPB  Status:  Discontinued        2 g 200 mL/hr over 30 Minutes Intravenous Every M-W-F (Hemodialysis) 12/20/20 2038 12/22/20 1314   12/21/20 1200  vancomycin (VANCOCIN) IVPB 1000 mg/200 mL premix  Status:  Discontinued        1,000 mg 200 mL/hr over 60 Minutes Intravenous Every M-W-F (Hemodialysis) 12/20/20 2038 12/22/20 1128   12/20/20 2200  metroNIDAZOLE (FLAGYL) tablet 500 mg  Status:  Discontinued        500 mg Oral Every 8 hours 12/20/20 2038 12/22/20 1314      Assessment: Pharmacy consulted to start heparin for afib. No DOAC PTA noted.   Goal of  Therapy:  Heparin level 0.3-0.7 units/ml Monitor platelets by anticoagulation protocol: Yes   Plan:  Give 4000 units bolus x 1 Start heparin infusion at 1000 units/hr Check anti-Xa level in 8 hours and daily while on heparin Continue to monitor H&H and platelets   Oswald Hillock 12/27/2020,4:13 PM

## 2020-12-27 NOTE — Consult Note (Signed)
Cardiology Consult    Patient ID: Lori [name] MRN: [id number], DOB/AGE: 51/05/1971   Admit date: 12/20/2020 Date of Consult: 12/27/2020  Primary Physician: Patient, No Pcp Per Primary Cardiologist: M. Terrence Dupont, MD Requesting Provider: S. Posey Pronto, MD  Patient Profile    Lori [name] is a 50 y.o. female with a history of CAD status post prior LAD, RCA, and obtuse marginal stenting, Prithvi Kooi-stage renal disease on hemodialysis, persistent atrial fibrillation, hypertension, hyperlipidemia, diastolic dysfunction, peripheral arterial disease, secondary hyperparathyroidism, obesity, Hashimoto's thyroiditis, and GERD who is being seen today for the evaluation of A. fib with RVR at the request of Dr. Posey Pronto.  Past Medical History   Past Medical History:  Diagnosis Date  . Anemia   . ASCVD (arteriosclerotic cardiovascular disease)    a. s/p prior stenting of LAD b. NSTEMI in 01/2018 requiring DES x3 to RCA 2/2 spiral dissection from ost->mid RCA but residual dzs in dRCA, LAD & OM2; c. 09/2019 Cath/PCI: LM nl, LAD 50 ISR, 65 p/m, 48m(2.5x15 Resolute Onyx DES), 90d, LCX 99d, OM2 99 (2.25x26 Resolute Onyx DES), RCA 40p/264mSR, 85d, RPDA 100ost, RPAV 50. EF 50-55%.  . Calciphylaxis   . Chronic abdominal wound infection   . COVID-19 virus infection 11/2020  . Dialysis patient ([facility]  . Diastolic dysfunction    a. 11/2020 Echo: EF 55-60%, mod conc LVh, gr2 DD, nl RV size/fxn. Sev dil LA. Triv MR. Mod-Sev mitral annular Ca2+. Mild-mod Ao sclerosis w/o stenosis.  . 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 peitonitis; difficult vascular access-->HD MWF.  . Marland Kitchenangrene of left foot ([facility]   a. 11/2020 L forefoot dry gangrene involving 4th and 5th MTP joints & digits, & R 5th MTP joint.  . Marland KitchenERD (gastroesophageal reflux disease)   . Hashimoto thyroiditis   . Hyperparathyroidism   . Hypertension   . Hypothyroidism   . Medically noncompliant   . Morbid  obesity ([facility]  . PAD (peripheral artery disease) (HCR[facility]   a. 11/2020 PTA of L peroneal, PTA/thrombectomy, Viabahn stenting x 2 to the L SFA and popliteal arteries (6x25028m 6x150m73m . Persistent atrial fibrillation (HCC)[place] a. Noted during hospitalization 11/2020.    Past Surgical History:  Procedure Laterality Date  . BTL AlvordCORONARY STENT INTERVENTION N/A 10/21/2019   Procedure: CORONARY STENT INTERVENTION;  Surgeon: AridWellington Hampshire;  [facility];  Service: Cardiovascular;  Laterality: N/A;  . LEFT HEART CATH AND CORONARY ANGIOGRAPHY N/A 02/11/2018   Procedure: LEFT HEART CATH AND CORONARY ANGIOGRAPHY;  Surgeon: HarwCharolette Forward;  [facility];  Service: Cardiovascular;  Laterality: N/A;  . LEFT HEART CATH AND CORONARY ANGIOGRAPHY N/A 10/14/2019   Procedure: LEFT HEART CATH AND CORONARY ANGIOGRAPHY;  Surgeon: AridWellington Hampshire;  [facility];  Service: Cardiovascular;  Laterality: N/A;  . LOWER EXTREMITY ANGIOGRAPHY Left 12/22/2020   Procedure: Lower Extremity Angiography;  Surgeon: Dew,Algernon Huxley;  [facility];  Service: Cardiovascular;  Laterality: Left;  . THYROIDECTOMY, PARTIAL     Resectin of left lobe with reimplantation in the forearm, small focus of papillary carcinoma incidentally noted at pathology - 2000 and Hashimoto's thyrdoiditis in 2001  . TONSILLECTOMY AND ADENOIDECTOMY       Allergies  Allergies  Allergen Reactions  . Activase [Alteplase] Shortness Of Breath  . Bee  Pollen Anaphylaxis  . Warfarin Sodium Nausea And Vomiting and Rash    History of Present Illness    50 year old female with above complex past medical history including CAD, Nashae Maudlin-stage renal disease on hemodialysis, persistent atrial fibrillation, hypertension, hyperlipidemia, diastolic dysfunction, peripheral arterial disease, secondary hyperparathyroidism, obesity, Hashimoto's thyroiditis, and GERD.  She previously  underwent remote LAD stenting and then suffered a non-STEMI in March 2019 requiring 3 drug-eluting stents to the RCA in the setting of a spiral dissection.  In November 2020, she was admitted with A. fib with RVR and non-STEMI and underwent diagnostic catheterization revealing severe multivessel CAD including new severe disease in the mid LAD and OM2 with known apical LAD disease and moderate distal RCA disease.  Initially, she was evaluated by thoracic surgery and was felt to have good targets but unfortunately inadequate vein conduits on mapping (felt the need CABG x5), and therefore PCI was carried out in the mid LAD and second obtuse marginal.  Patient was apparently on warfarin for a period of time afterwards but this was discontinued in the setting of nausea, vomiting, and rash.  She has been on aspirin and Brilinta ever since this procedure in November 2020.  She is followed [facility] by Dr. Terrence Dupont.  Over the past month or more, patient has been followed by podiatry in the setting of a poorly healing left dorsal foot wound involving the fourth and fifth MTP joints.  This was treated with antibiotics without significant improvement.  On January 19, patient was getting out of the [facility] to walk into hemodialysis, when she lost her balance and fell, striking her head.  No syncope was noted.  She went inside and underwent dialysis but felt very weak and fatigued and was subsequently sent to the emergency department at any [facility]  There, she incidentally tested positive for COVID-19.  Leukocytosis was noted with a white count of 20,000.  Blood pressure was relatively soft and she was treated with IV fluids with improvement.  An x-ray of the left foot showed no evidence of osteomyelitis.  Patient was admitted to the hospitalist service and treated for sepsis as well as COVID-19 infection (remdesivir x3 days).  She reported that she had been out of several of her cardiac medications and was awaiting  follow-up with her primary cardiologist prior to refills.  Echocardiogram showed normal LV function with grade 2 diastolic dysfunction and no significant valvular disease.  Left lower extremity arterial duplex showed monophasic waveforms with no signal distally.  Wound culture from the left foot grew Enterococcus faecalis and Proteus vulgaris and antibiotics were narrowed to Zosyn.  She was evaluated by surgery for consideration of left above-the-knee amputation the patient wished a second opinion from vascular surgery and she was transferred to [facility] on January 25.  Here, she was seen by vascular surgery and underwent lower extremity angiography on January 27 with successful PTA of the left peroneal as well as PTA and mechanical thrombectomy of the left SFA and left proximal popliteal with stenting x2.  Following this, she was evaluated by podiatry with plan for amputation of the left fourth and fifth metatarsal phalangeal joint and digits.  This was initially planned for January 29 was aborted secondary to hypotension.  Right lower extremity angiogram recommended for further evaluation of right fifth MTP joint gangrene.  Patient developed rapid atrial fibrillation on January 30 and was initially treated with digoxin in the setting of soft blood pressures.  In the setting of tachycardia, pressures dropped  into the 40s and she was transferred to the ICU.  She was placed on norepinephrine and plan was for IV amio however, it does not appear that was ever started..  Blood pressures improved and norepinephrine therapy was able to be discontinued by January 31.  She was placed on diltiazem infusion late on January 30 however, in the setting of ongoing hypotension, this was discontinued and she was eventually switched over to oral carvedilol.  Dosing of this has been limited as well by soft blood pressures with multiple doses held.  We have been consulted for evaluation and management of atrial  fibrillation.  Currently, Ms. Vanstone's only complaint is of pain in the left foot, especially with repositioning.  She denies chest pain, shortness of breath, palpitations, and lightheadedness.  She tolerated hemodialysis well this afternoon.  Inpatient Medications    . aspirin EC  81 mg Oral Daily  . Chlorhexidine Gluconate Cloth  6 each Topical Daily  . epoetin (EPOGEN/PROCRIT) injection  8,000 Units Intravenous Q T,Th,Sa-HD  . gabapentin  100 mg Oral QHS  . levothyroxine  300 mcg Oral QHS  . metoprolol tartrate  12.5 mg Oral Q6H  . midodrine  10 mg Oral TID WC  . pantoprazole  40 mg Oral QHS  . sevelamer carbonate  3,200 mg Oral TID WC  . sodium chloride flush  10-40 mL Intracatheter Q12H  . ticagrelor  90 mg Oral BID    Family History    Family History  Problem Relation Age of Onset  . Coronary artery disease Mother   . Kidney disease Father   . Diabetes Sister    She indicated that her mother is deceased. She indicated that her father is deceased. She indicated that the status of her sister is unknown.   Social History    Social History   Socioeconomic History  . Marital status: Married    Spouse name: Not on file  . Number of children: Not on file  . Years of education: Not on file  . Highest education level: Not on file  Occupational History  . Not on file  Tobacco Use  . Smoking status: Former Research scientist (life sciences)  . Smokeless tobacco: Never Used  Vaping Use  . Vaping Use: Never used  Substance and Sexual Activity  . Alcohol use: No  . Drug use: No  . Sexual activity: Yes    Birth control/protection: Surgical  Other Topics Concern  . Not on file  Social History Narrative   Married   No regular exercise   Social Determinants of Health   Financial Resource Strain: Not on file  Food Insecurity: Not on file  Transportation Needs: Not on file  Physical Activity: Not on file  Stress: Not on file  Social Connections: Not on file  Intimate Partner Violence: Not on  file     Review of Systems    General:  No chills, fever, night sweats or weight changes.  Cardiovascular:  No chest pain, dyspnea on exertion, edema, orthopnea, palpitations, paroxysmal nocturnal dyspnea. Dermatological: No rash, lesions/masses Respiratory: No cough, dyspnea Urologic: No hematuria, dysuria Abdominal:   No nausea, vomiting, diarrhea, bright red blood per rectum, melena, or hematemesis Neurologic:  No visual changes, wkns, changes in mental status. All other systems reviewed and are otherwise negative except as noted above.  Physical Exam    Blood pressure 102/80, pulse (!) 30, temperature (!) 96.2 F (35.7 C), resp. rate 14, height '5\' 3"'$  (1.6 m), weight 110.8 kg, SpO2 Marland Kitchen)  73 %.  General: Pleasant, NAD Psych: Normal affect. Neuro: Alert and oriented X 3. Moves all extremities spontaneously. HEENT: Normal  Neck: Supple without bruits or JVD, though evaluation is limited by body habitus. Lungs: Diminished breath sounds throughout without wheezes or crackles. Heart: Irregularly irregular and tachycardic.  No murmurs. Abdomen: Soft, non-tender, non-distended, BS + x 4.  Extremities: No lower extremity edema.  Both feet are wrapped with gauze.  Labs      Lab Results  Component Value Date   WBC 14.3 (H) 12/26/2020   HGB 8.0 (L) 12/26/2020   HCT 25.5 (L) 12/26/2020   MCV 100.4 (H) 12/26/2020   PLT 167 12/26/2020    Recent Labs  Lab 12/21/20 0440 12/22/20 0441 12/26/20 0600  NA 135   < > 134*  K 4.6   < > 5.1  CL 97*   < > 97*  CO2 23   < > 25  BUN 40*   < > 50*  CREATININE 9.14*   < > 9.01*  CALCIUM 8.8*   < > 8.5*  PROT 6.2*  --   --   BILITOT 0.7  --   --   ALKPHOS 56  --   --   ALT 27  --   --   AST 32  --   --   GLUCOSE 81   < > 107*   < > = values in this interval not displayed.   Lab Results  Component Value Date   CHOL 219 (H) 10/14/2019   HDL 37 (L) 10/14/2019   LDLCALC 161 (H) 10/14/2019   TRIG 106 10/14/2019    Radiology Studies     CT ABDOMEN PELVIS WO CONTRAST  Result Date: 12/14/2020 CLINICAL DATA:  Generalized abdominal pain following fall, initial encounter EXAM: CT ABDOMEN AND PELVIS WITHOUT CONTRAST TECHNIQUE: Multidetector CT imaging of the abdomen and pelvis was performed following the standard protocol without IV contrast. COMPARISON:  05/03/2013 FINDINGS: Lower chest: No acute abnormality. Heavy coronary calcifications are noted. Hepatobiliary: Tiny dependent gallstones are noted. The gallbladder is otherwise within normal limits. Liver is unremarkable. Pancreas: Unremarkable. No pancreatic ductal dilatation or surrounding inflammatory changes. Spleen: Normal in size without focal abnormality. Adrenals/Urinary Tract: Adrenal glands are within normal limits. Kidneys demonstrate significant cortical thinning with multiple cysts the largest of which lies in the lower pole of the right kidney measuring 5.8 cm in greatest dimension. This has increased in size from the prior exam at which time it measured 4.6 cm. No obstructive changes are seen. No definitive renal calculi are noted. The bladder is decompressed. These changes are consistent with the patient's known history of Jameek Bruntz-stage renal failure. Stomach/Bowel: Colon is well visualized without obstructive or inflammatory changes. The appendix is within normal limits. No small bowel or gastric abnormality is noted. Vascular/Lymphatic: Aortic atherosclerosis. No enlarged abdominal or pelvic lymph nodes. Left retroaortic renal vein is noted. Multiple collaterals are noted in the anterior abdominal wall likely related to central venous stenosis. These are stable from the prior exam. Calcified graft in the left thigh proximally is noted. Reproductive: Uterus and bilateral adnexa are unremarkable. Other: No abdominal wall hernia or abnormality. No abdominopelvic ascites. Musculoskeletal: No acute or significant osseous findings. IMPRESSION: Cholelithiasis without complicating factors.  Changes consistent with Linlee Cromie-stage renal failure with multiple renal cysts bilaterally and diffuse cortical thinning. Aortic atherosclerotic changes. Multiple venous collaterals in the anterior abdominal wall consistent with central venous stenosis likely related to the known dialysis. No acute abnormality related  to the recent injury is noted. Electronically Signed   By: Inez Catalina M.D.   On: 12/14/2020 17:10   DG Chest 2 View  Result Date: 12/14/2020 CLINICAL DATA:  Pain following fall EXAM: CHEST - 2 VIEW COMPARISON:  October 12, 2019 chest radiograph and chest CT October 16, 2019 FINDINGS: Central catheter tip is in the superior vena cava. No pneumothorax. There is no edema or airspace opacity. Heart is mildly enlarged with pulmonary vascularity normal. No adenopathy. No bone lesions. Postoperative change in the thyroid region noted. IMPRESSION: Central catheter as described without pneumothorax. Lungs clear. Heart mildly enlarged. Electronically Signed   By: Lowella Grip III M.D.   On: 12/14/2020 15:16   CT Head Wo Contrast  Result Date: 12/14/2020 CLINICAL DATA:  Fall.  Hit head. EXAM: CT HEAD WITHOUT CONTRAST CT CERVICAL SPINE WITHOUT CONTRAST TECHNIQUE: Multidetector CT imaging of the head and cervical spine was performed following the standard protocol without intravenous contrast. Multiplanar CT image reconstructions of the cervical spine were also generated. COMPARISON:  CT head dated June 15, 2010. FINDINGS: CT HEAD FINDINGS Brain: No evidence of acute infarction, hemorrhage, hydrocephalus, extra-axial collection or mass lesion/mass effect. Tiny old lacunar infarct in the right basal ganglia. Vascular: Calcified atherosclerosis at the skullbase. No hyperdense vessel. Skull: Thickened skull with diffuse sclerosis and patchy sclerotic foci, consistent with renal osteodystrophy. No fracture. Sinuses/Orbits: No acute finding. Other: None. CT CERVICAL SPINE FINDINGS Alignment: Mild reversal of  the normal cervical lordosis. No traumatic malalignment. Skull base and vertebrae: No acute fracture. No primary bone lesion or focal pathologic process. Soft tissues and spinal canal: No prevertebral fluid or swelling. No visible canal hematoma. Disc levels: Mild disc height loss at C6-C7. Mild right uncovertebral hypertrophy at C2-C3 and C3-C4. Upper chest: Negative. Other: Prior thyroidectomy. IMPRESSION: 1. No acute intracranial abnormality. Old right basal ganglia lacunar infarct. 2. No acute cervical spine fracture or traumatic listhesis. 3. Renal osteodystrophy. Electronically Signed   By: Titus Dubin M.D.   On: 12/14/2020 15:15   CT Cervical Spine Wo Contrast  Result Date: 12/14/2020 CLINICAL DATA:  Fall.  Hit head. EXAM: CT HEAD WITHOUT CONTRAST CT CERVICAL SPINE WITHOUT CONTRAST TECHNIQUE: Multidetector CT imaging of the head and cervical spine was performed following the standard protocol without intravenous contrast. Multiplanar CT image reconstructions of the cervical spine were also generated. COMPARISON:  CT head dated June 15, 2010. FINDINGS: CT HEAD FINDINGS Brain: No evidence of acute infarction, hemorrhage, hydrocephalus, extra-axial collection or mass lesion/mass effect. Tiny old lacunar infarct in the right basal ganglia. Vascular: Calcified atherosclerosis at the skullbase. No hyperdense vessel. Skull: Thickened skull with diffuse sclerosis and patchy sclerotic foci, consistent with renal osteodystrophy. No fracture. Sinuses/Orbits: No acute finding. Other: None. CT CERVICAL SPINE FINDINGS Alignment: Mild reversal of the normal cervical lordosis. No traumatic malalignment. Skull base and vertebrae: No acute fracture. No primary bone lesion or focal pathologic process. Soft tissues and spinal canal: No prevertebral fluid or swelling. No visible canal hematoma. Disc levels: Mild disc height loss at C6-C7. Mild right uncovertebral hypertrophy at C2-C3 and C3-C4. Upper chest: Negative.  Other: Prior thyroidectomy. IMPRESSION: 1. No acute intracranial abnormality. Old right basal ganglia lacunar infarct. 2. No acute cervical spine fracture or traumatic listhesis. 3. Renal osteodystrophy. Electronically Signed   By: Titus Dubin M.D.   On: 12/14/2020 15:15   US ARTERIAL ABI (SCREENING LOWER EXTREMITY)  Result Date: 12/15/2020 CLINICAL DATA:  50 year old female with a history cellulitis of the  fourth EXAM: NONINVASIVE PHYSIOLOGIC VASCULAR STUDY OF BILATERAL LOWER EXTREMITIES TECHNIQUE: Evaluation of both lower extremities was performed at rest, including calculation of ankle-brachial indices, multiple segmental pressure evaluation, segmental Doppler and segmental pulse volume recording. COMPARISON:  None. FINDINGS: Right ABI:  Not acquired Left ABI:  Not acquired Right Lower Extremity: Segmental Doppler at the right ankle demonstrates no signal of the posterior tibial artery and monophasic dorsalis pedis. Left Lower Extremity: No signal identified within the posterior tibial artery or dorsalis pedis on the left. IMPRESSION: Resting ABI the bilateral lower extremities was not performed. Monophasic waveform of the dorsalis pedis on the right with no signal of the posterior tibial artery. On the left no signal was identified within the tibial arteries at the ankle. Signed, Dulcy Fanny. Dellia Nims, RPVI [facility] Electronically Signed   By: Corrie Mckusick D.O.   On: 12/15/2020 11:35   US ARTERIAL LOWER EXTREMITY DUPLEX LEFT (NON-ABI)  Result Date: 12/15/2020 CLINICAL DATA:  50 year old female with a left foot wound EXAM: NONINVASIVE PHYSIOLOGIC VASCULAR STUDY OF UNILATERAL LOWER EXTREMITIES TECHNIQUE: Evaluation of left lower extremities was performed at rest, including directed duplex. COMPARISON:  None. FINDINGS: Left ABI: Not acquired Left Lower Extremity: Directed duplex left lower extremity demonstrates monophasic waveform of the  common femoral artery, superficial femoral artery, popliteal artery. Monophasic anterior tibial artery proximally. No signal distally. No signal of the distal peroneal artery or posterior tibial artery. IMPRESSION: Directed duplex demonstrates monophasic waveform of the common femoral artery, profunda femoris, SFA, popliteal artery. There is no signal identified within the distal anterior tibial artery, posterior tibial artery, peroneal artery. Signed, Dulcy Fanny. Dellia Nims, RPVI [facility] Electronically Signed   By: Corrie Mckusick D.O.   On: 12/15/2020 15:59   DG Foot Complete Left  Result Date: 12/14/2020 CLINICAL DATA:  Wound on foot near fourth and fifth MTP EXAM: LEFT FOOT - COMPLETE 3+ VIEW COMPARISON:  None. FINDINGS: No fracture or malalignment. Vascular calcifications. No soft tissue emphysema or radiopaque foreign body. IMPRESSION: No acute osseous abnormality. Electronically Signed   By: Donavan Foil M.D.   On: 12/14/2020 15:18   DG Foot Complete Right  Result Date: 12/23/2020 CLINICAL DATA:  Lateral foot wound EXAM: RIGHT FOOT COMPLETE - 3+ VIEW COMPARISON:  None. FINDINGS: No acute bony abnormality. Specifically, no fracture, subluxation, or dislocation. No bone destruction. Joint spaces maintained. IMPRESSION: No acute bony abnormality. Electronically Signed   By: Rolm Baptise M.D.   On: 12/23/2020 18:17   DG Hips Bilat W or Wo Pelvis 3-4 Views  Result Date: 12/14/2020 CLINICAL DATA:  Fall, initial encounter. EXAM: DG HIP (WITH OR WITHOUT PELVIS) 3-4V BILAT COMPARISON:  None. FINDINGS: Image quality is degraded by body habitus. No acute osseous or joint abnormality. Vascular stent is seen in the left femoral vasculature. IMPRESSION: Image quality is degraded by body habitus. No definite acute osseous or joint abnormality. Electronically Signed   By: Lorin Picket M.D.   On: 12/14/2020 15:17    ECG & Cardiac Imaging     12/14/2020 - RSR, 67, IVCD, poor R progression, mild inferolateral ST depression - personally reviewed.  Assessment & Plan    Atrial fibrillation with rapid ventricular response: Notes suggest a history of chronic atrial fibrillation, the patient was in sinus rhythm when she first presented to [facility] last month.  It appears that she went back into atrial fibrillation around 12/26/2019, with ventricular rates having been  difficult to control.  Currently, she is only on carvedilol 3.125 mg twice daily as hypotension has limited medical therapy.  She would benefit from anticoagulation given a CHA2DS2-VASc score of at least four (gender, hypertension, CAD/PAD, and HFpEF).  I suspect that PAD and possible superimposed infection from foot gangrene may be driving her atrial fibrillation.  Recent COVID-19 infection could also be playing a role.  Transition carvedilol to metoprolol tartrate 12.5 mg every 6 hours, to be escalated as tolerated based on blood pressure and heart rate response.  If ventricular rates cannot be well controlled, TEE-guided cardioversion later this week will need to be considered.  Initiate heparin infusion for anticoagulation with plans to transition to apixaban prior to discharge.  Check TSH (noted to be severely elevated on last check in 01/2018)  Coronary artery disease: Patient with history of multiple prior PCI's, most recently in 09/2019.  She denies symptoms to suggest worsening coronary insufficiency.  Echocardiogram last month showed normal LVEF  Add statin for secondary prevention.  Given need for anticoagulation, as outlined above, favor discontinuation of ticagrelor and transitioning to apixaban plus aspirin or clopidogrel.  We will reach out to vascular surgery to inquire about their preference given recent lower extremity covered stent placement.  PAD and gangrene: Mild pain noted with repositioning.  Otherwise, patient is asymptomatic.  Consider adding  statin, as above.  Discuss anticoagulation/antiplatelet strategy with vascular surgery.  COVID-19 infection: Patient without symptoms.  Per internal medicine.  Karlei Waldo-stage renal disease:  Continue hemodialysis per nephrology.  Okay to continue midodrine to support blood pressure. For questions or updates, please contact   Please consult [email or web address] for contact info under [facility].  Nelva Bush, MD [facility] 12/27/20 @ 7:17 PM

## 2020-12-27 NOTE — Progress Notes (Addendum)
[facility] NAME: Lori [name]    MR#:  [id number]  DATE OF BIRTH:  1971/03/06  SUBJECTIVE:   Patient getting dialysis earlier. Heart rate ranges from 114-- 140 no chest pain she is off IV pressers REVIEW OF SYSTEMS:   Review of Systems  Constitutional: Positive for malaise/fatigue. Negative for chills, fever and weight loss.  HENT: Negative for ear discharge, ear pain and nosebleeds.   Eyes: Negative for blurred vision, pain and discharge.  Respiratory: Negative for sputum production, shortness of breath, wheezing and stridor.   Cardiovascular: Negative for chest pain, palpitations, orthopnea and PND.  Gastrointestinal: Negative for abdominal pain, diarrhea, nausea and vomiting.  Genitourinary: Negative for frequency and urgency.  Musculoskeletal: Negative for back pain and joint pain.  Neurological: Positive for weakness. Negative for sensory change, speech change and focal weakness.  Psychiatric/Behavioral: Negative for depression and hallucinations. The patient is not nervous/anxious.    Tolerating Diet:yes Tolerating PT:   DRUG ALLERGIES:   Allergies  Allergen Reactions  . Activase [Alteplase] Shortness Of Breath  . Bee Pollen Anaphylaxis  . Warfarin Sodium Nausea And Vomiting and Rash    VITALS:  Blood pressure 102/80, pulse (!) 30, temperature (!) 96.2 F (35.7 C), resp. rate 14, height '5\' 3"'$  (1.6 m), weight 110.8 kg, SpO2 (!) 73 %.  PHYSICAL EXAMINATION:   Physical Exam  GENERAL:  50 y.o.-year-old patient lying in the bed with no acute distress. Appears chronically ill LUNGS: Normal breath sounds bilaterally, no wheezing, rales, rhonchi. No use of accessory muscles of respiration.  CARDIOVASCULAR: S1, S2 normal. No murmurs, rubs, or gallops. Tachycardia/a fib ABDOMEN: Soft, nontender, nondistended. Bowel sounds present. EXTREMITIES:    NEUROLOGIC: moves all extremities well. Decreased sensation in feet.    PSYCHIATRIC:  patient is alert and oriented x 3.  SKIN: as above  LABORATORY PANEL:  CBC Recent Labs  Lab 12/26/20 0600  WBC 14.3*  HGB 8.0*  HCT 25.5*  PLT 167    Chemistries  Recent Labs  Lab 12/21/20 0440 12/22/20 0441 12/26/20 0600  NA 135   < > 134*  K 4.6   < > 5.1  CL 97*   < > 97*  CO2 23   < > 25  GLUCOSE 81   < > 107*  BUN 40*   < > 50*  CREATININE 9.14*   < > 9.01*  CALCIUM 8.8*   < > 8.5*  MG  --    < > 1.7  AST 32  --   --   ALT 27  --   --   ALKPHOS 56  --   --   BILITOT 0.7  --   --    < > = values in this interval not displayed.   Cardiac Enzymes No results for input(s): TROPONINI in the last 168 hours. RADIOLOGY:  [place] EKG SITE RITE  Result Date: 12/25/2020 If Site Rite image not attached, placement could not be confirmed due to current cardiac rhythm.  ASSESSMENT AND PLAN:  50 y.o.femalewith medical history significant forCAD s/p stent to LAD in 2020, ESRD on HD MWF, hypertension, hypothyroidism, hyperparathyroidism, GERD who was initially admitted to [facility] on 12/14/2020 for a left foot wound.  She was started on antibiotics.  General surgery evaluated the patient and opined that she may ultimately need a left AKA.  Patient wanted to discuss with her vascular surgeon Dr. Delana Meyer before any surgery would be considered.  She was transferred to [facility] for further evaluation   Rapid a fib with RVR/hypotension/questionable septic shock in the setting of ESRB and left foot infection -- patient was transferred to ICU on 30 January -- she received some bolus of amiodarone, digoxin, IV Cardizem drip now off of it -- blood pressure remains soft requiring IV Levophed--now d/ced -- patient's heart rate is 114--144  -- she is currently on Coreg 3.125 BID -- Dr [facility] cardiology consultation requested for managing a fib RVR inpatient with relative hypotension and multiple other medical problems.  --on asa  Left foot wound infection with  gangrene/chronic ischemia of the left lower extremity -Dry necrotic/ischemic appearance of left fourth and fifth toes --[name] -- Status post aortogram and left lower extremity angiogram with angioplasty of left peroneal artery/left SFA and popliteal arteries along with mechanical thrombectomy and stent placement of the left SFA and popliteal artery on 12/22/2020.   --Vascular surgery now recommending outpatient follow-up.  Will possibly add statin on discharge -Antibioticspt was on IV  to Zosyn alone on 12/22/2020 based on wound culture which has grown Enterococcus faecalis and Proteus vulgaris--now on IV unasyn (d/w ID pharmacist)--will rx for total 10 days -Podiatry following: Surgical intervention plan was aborted on 12/24/2020 because of hypotension.  As per podiatry, patient also needs vascular work-up for right fifth metatarsal phalangeal joint gangrene  Leukocytosis -From above.  Improving.  Monitor.  End-stage renal disease on hemodialysis -Nephrology following.  Continue dialysis as per nephrology schedule  Anemia of chronic disease -From renal failure.  Hemoglobin stable.  COVID-19 positive -Incidentally Covid + 12/14/2020. She has been asymptomatic. She states she has been fully vaccinated including booster dose. She received 3 days IV remdesivir. Currently no need for further treatment.  -Still on room air.  CAD: -Continue aspirin, Coreg and Brilinta. -Not currently on statin --follows with Dr Skipper [facility]liche [facility])  Hypertension: with relative hypotension -Monitor blood pressure.  Continue Coreg  Hypothyroidism: -Continue Synthroid   DVT prophylaxis: Heparin Code Status: Full Family Communication: None Disposition Plan: Status is: Inpatient  Remains inpatient appropriate because:Inpatient level of care appropriate due to severity of illness   Dispo: The patient is from: Home  Anticipated d/c  is to: Home  Anticipated d/c date is: > 3 days  Patient currently is not medically stable to d/c.              Difficult to place patient No patient to be seen by cardiology today for ongoing rapid a fib with RVR. Consultants: Vascular surgery/nephrology/podiatry/ cardilogy  Procedures: aortogram and left lower extremity angiogram with angioplasty of left peroneal artery/left SFA and popliteal arteries along with mechanical thrombectomy and stent placement of the left SFA and popliteal artery on 12/22/2020    Level of care: Progressive Cardiac Status is: Inpatient    TOTAL TIME TAKING CARE OF THIS PATIENT: *35* minutes.  >50% time spent on counselling and coordination of care  Note: This dictation was prepared with Dragon dictation along with smaller phrase technology. Any transcriptional errors that result from this process are unintentional.  Fritzi Mandes M.D    [facility]   CC: Primary care physician; Patient, No Pcp PerPatient ID: Doroteo Bradford, female   DOB: 05-Jan-1971, 50 y.o.   MRN: [id number]

## 2020-12-28 ENCOUNTER — Other Ambulatory Visit (INDEPENDENT_AMBULATORY_CARE_PROVIDER_SITE_OTHER): Payer: Self-pay | Admitting: Vascular Surgery

## 2020-12-28 DIAGNOSIS — I2510 Atherosclerotic heart disease of native coronary artery without angina pectoris: Secondary | ICD-10-CM

## 2020-12-28 DIAGNOSIS — Z515 Encounter for palliative care: Secondary | ICD-10-CM

## 2020-12-28 DIAGNOSIS — I739 Peripheral vascular disease, unspecified: Secondary | ICD-10-CM

## 2020-12-28 DIAGNOSIS — Z7189 Other specified counseling: Secondary | ICD-10-CM

## 2020-12-28 LAB — CBC
HCT: 23 % — ABNORMAL LOW (ref 36.0–46.0)
Hemoglobin: 7.5 g/dL — ABNORMAL LOW (ref 12.0–15.0)
MCH: 32.1 pg (ref 26.0–34.0)
MCHC: 32.6 g/dL (ref 30.0–36.0)
MCV: 98.3 fL (ref 80.0–100.0)
Platelets: 182 10*3/uL (ref 150–400)
RBC: 2.34 MIL/uL — ABNORMAL LOW (ref 3.87–5.11)
RDW: 15.9 % — ABNORMAL HIGH (ref 11.5–15.5)
WBC: 15.3 10*3/uL — ABNORMAL HIGH (ref 4.0–10.5)
nRBC: 0 % (ref 0.0–0.2)

## 2020-12-28 LAB — HEPARIN LEVEL (UNFRACTIONATED)
Heparin Unfractionated: 0.22 IU/mL — ABNORMAL LOW (ref 0.30–0.70)
Heparin Unfractionated: 0.3 IU/mL (ref 0.30–0.70)
Heparin Unfractionated: <0.1 IU/mL — ABNORMAL LOW (ref 0.30–0.70)

## 2020-12-28 MED ORDER — ATORVASTATIN CALCIUM 20 MG PO TABS
40.0000 mg | ORAL_TABLET | Freq: Every day | ORAL | Status: DC
  Administered 2020-12-31 – 2021-01-10 (×10): 40 mg via ORAL
  Filled 2020-12-28 (×10): qty 2

## 2020-12-28 MED ORDER — HEPARIN BOLUS VIA INFUSION
1150.0000 [IU] | Freq: Once | INTRAVENOUS | Status: AC
  Administered 2020-12-28: 1150 [IU] via INTRAVENOUS
  Filled 2020-12-28: qty 1150

## 2020-12-28 MED ORDER — HEPARIN BOLUS VIA INFUSION
2200.0000 [IU] | Freq: Once | INTRAVENOUS | Status: AC
  Administered 2020-12-28: 2200 [IU] via INTRAVENOUS
  Filled 2020-12-28: qty 2200

## 2020-12-28 NOTE — Progress Notes (Signed)
[facility]  ROUNDING NOTE   Subjective:    Denies any acute complaints Denies any acute shortness of breath- room air No pressors Just ate breakfast HR in 130's No chest pain Remains in AFib; requiring heparin infusion  Objective:  Vital signs in last 24 hours:  Temp:  [96.2 F (35.7 C)-97.7 F (36.5 C)] 97.7 F (36.5 C) (02/02 0000) Pulse Rate:  [25-123] 32 (02/02 0700) Resp:  [12-25] 13 (02/02 0700) BP: (64-105)/(19-80) 105/77 (02/02 0700) SpO2:  [73 %-100 %] 98 % (02/02 0700)  Weight change:  Filed Weights   12/21/20 0112 12/22/20 0427 12/23/20 0553  Weight: 109.2 kg (P) 110 kg 110.8 kg    Intake/Output: I/O last 3 completed shifts: In: 501.3 [P.O.:160; I.V.:141.3; IV Piggyback:200] Out: 0    Intake/Output this shift:  No intake/output data recorded.  Physical Exam: General: NAD, laying in bed  Head: Anicteric. Moist oral mucosal membranes  Lungs:   Coarse breath sounds  Heart: Irregular tachycardic  Abdomen:  Soft, nontender, obese  Extremities:  both feet bandaged  Neurologic:  alert, oriented  Access: RIJ permcath       Basic Metabolic Panel: Recent Labs  Lab 12/22/20 0441 12/23/20 0641 12/24/20 0820 12/25/20 0700 12/26/20 0025 12/26/20 0600  NA 135 138 138 135 135 134*  K 5.3* 4.9 4.9 4.6 4.7 5.1  CL 97* 97* 97* 98 98 97*  CO2 '23 24 25 25 25 25  '$ GLUCOSE 75 130* 87 86 142* 107*  BUN 50* 35* 50* 37* 46* 50*  CREATININE 10.68* 8.33* 9.75* 7.45* 8.07* 9.01*  CALCIUM 8.9 8.4* 8.7* 8.4* 7.8* 8.5*  MG  --   --   --   --  1.6* 1.7  PHOS 6.0*  --  6.1*  --  4.5 4.9*    Liver Function Tests: Recent Labs  Lab 12/24/20 0820  ALBUMIN 3.2*   No results for input(s): LIPASE, AMYLASE in the last 168 hours. No results for input(s): AMMONIA in the last 168 hours.  CBC: Recent Labs  Lab 12/22/20 0441 12/23/20 0641 12/24/20 0820 12/25/20 0700 12/26/20 0600  WBC 14.0* 22.7* 14.2* 12.8* 14.3*  NEUTROABS 8.8* 18.8*  --  9.1*  10.9*  HGB 10.1* 9.7* 8.9* 8.4* 8.0*  HCT 31.1* 31.5* 28.2* 27.1* 25.5*  MCV 98.1 101.6* 101.1* 101.5* 100.4*  PLT 147* 165 161 150 167    Cardiac Enzymes: No results for input(s): CKTOTAL, CKMB, CKMBINDEX, TROPONINI in the last 168 hours.  BNP: Invalid input(s): POCBNP  CBG: Recent Labs  Lab 12/25/20 2141 12/26/20 0749 12/26/20 1115 12/27/20 0726  GLUCAP 90 81 110* 91    Microbiology: Results for orders placed or performed during the hospital encounter of 12/14/20  Blood culture (routine x 2)     Status: None   Collection Time: 12/14/20  2:18 PM   Specimen: Right Antecubital; Blood  Result Value Ref Range Status   Specimen Description RIGHT ANTECUBITAL  Final   Special Requests   Final    BOTTLES DRAWN AEROBIC AND ANAEROBIC Blood Culture results may not be optimal due to an inadequate volume of blood received in culture bottles   Culture   Final    NO GROWTH 5 DAYS Performed at [facility], [address] [place], [place] [address]    Report Status 12/19/2020 FINAL  Final  Resp Panel by RT-PCR (Flu A&B, Covid) Nasopharyngeal Swab     Status: Abnormal   Collection Time: 12/14/20  7:21 PM   Specimen: Nasopharyngeal  Swab; Nasopharyngeal(NP) swabs in vial transport medium  Result Value Ref Range Status   SARS Coronavirus 2 by RT PCR POSITIVE (A) NEGATIVE Final    Comment: RESULT CALLED TO, READ BACK BY AND VERIFIED WITH: HARRY,A @ 2016 ON 12/14/20 BY JUW (NOTE) SARS-CoV-2 target nucleic acids are DETECTED.  The SARS-CoV-2 RNA is generally detectable in upper respiratory specimens during the acute phase of infection. Positive results are indicative of the presence of the identified virus, but do not rule out bacterial infection or co-infection with other pathogens not detected by the test. Clinical correlation with patient history and other diagnostic information is necessary to determine patient infection status. The expected result is Negative.  Fact Sheet for  Patients: EntrepreneurPulse.com.au  Fact Sheet for Healthcare Providers: IncredibleEmployment.be  This test is not yet approved or cleared by the [place] FDA and  has been authorized for detection and/or diagnosis of SARS-CoV-2 by FDA under an Emergency Use Authorization (EUA).  This EUA will remain in effect (meaning this test can be  used) for the duration of  the COVID-19 declaration under Section 564(b)(1) of the Act, 21 U.S.C. section 360bbb-3(b)(1), unless the authorization is terminated or revoked sooner.     Influenza A by PCR NEGATIVE NEGATIVE Final   Influenza B by PCR NEGATIVE NEGATIVE Final    Comment: (NOTE) The Xpert Xpress SARS-CoV-2/FLU/RSV plus assay is intended as an aid in the diagnosis of influenza from Nasopharyngeal swab specimens and should not be used as a sole basis for treatment. Nasal washings and aspirates are unacceptable for Xpert Xpress SARS-CoV-2/FLU/RSV testing.  Fact Sheet for Patients: EntrepreneurPulse.com.au  Fact Sheet for Healthcare Providers: IncredibleEmployment.be  This test is not yet approved or cleared by the [place] FDA and has been authorized for detection and/or diagnosis of SARS-CoV-2 by FDA under an Emergency Use Authorization (EUA). This EUA will remain in effect (meaning this test can be used) for the duration of the COVID-19 declaration under Section 564(b)(1) of the Act, 21 U.S.C. section 360bbb-3(b)(1), unless the authorization is terminated or revoked.  Performed at [facility], [address], [place], [place] [address]   Blood culture (routine x 2)     Status: None   Collection Time: 12/14/20  9:25 PM   Specimen: Neck; Blood  Result Value Ref Range Status   Specimen Description NECK  Final   Special Requests   Final    BOTTLES DRAWN AEROBIC AND ANAEROBIC Blood Culture adequate volume   Culture   Final    NO GROWTH 5 DAYS Performed  at [facility], [address], [place], [place] [address]    Report Status 12/19/2020 FINAL  Final  Aerobic Culture (superficial specimen)     Status: None   Collection Time: 12/18/20  8:38 AM   Specimen: Wound  Result Value Ref Range Status   Specimen Description   Final    WOUND Performed at [facility], [address], [place], [place] [address]    Special Requests   Final    LEFT FOOT Performed at [facility], [address], [place], [place] [address]    Gram Stain   Final    RARE WBC PRESENT, PREDOMINANTLY MONONUCLEAR FEW GRAM POSITIVE COCCI RARE YEAST Performed at [facility], [place] [address], [place], [place] [address]    Culture   Final    MODERATE ENTEROCOCCUS FAECALIS MODERATE PROTEUS VULGARIS    Report Status 12/22/2020 FINAL  Final   Organism ID, Bacteria ENTEROCOCCUS FAECALIS  Final   Organism ID, Bacteria PROTEUS VULGARIS  Final      Susceptibility   Enterococcus faecalis - MIC*    AMPICILLIN <=2 SENSITIVE Sensitive     VANCOMYCIN 2 SENSITIVE Sensitive     GENTAMICIN SYNERGY SENSITIVE Sensitive     * MODERATE ENTEROCOCCUS FAECALIS   Proteus vulgaris - MIC*    AMPICILLIN >=32 RESISTANT Resistant     CEFAZOLIN >=64 RESISTANT Resistant     CEFEPIME <=0.12 SENSITIVE Sensitive     CEFTAZIDIME <=1 SENSITIVE Sensitive     CIPROFLOXACIN <=0.25 SENSITIVE Sensitive     GENTAMICIN <=1 SENSITIVE Sensitive     IMIPENEM 4 SENSITIVE Sensitive     TRIMETH/SULFA <=20 SENSITIVE Sensitive     AMPICILLIN/SULBACTAM 8 SENSITIVE Sensitive     PIP/TAZO <=4 SENSITIVE Sensitive     * MODERATE PROTEUS VULGARIS    Coagulation Studies: No results for input(s): LABPROT, INR in the last 72 hours.  Urinalysis: No results for input(s): COLORURINE, LABSPEC, PHURINE, GLUCOSEU, HGBUR, BILIRUBINUR, KETONESUR, PROTEINUR, UROBILINOGEN, NITRITE, LEUKOCYTESUR in the last 72 hours.  Invalid input(s): APPERANCEUR    Imaging: No results found.   Medications:   . sodium  chloride Stopped (12/26/20 0000)  . ampicillin-sulbactam (UNASYN) IV 3 g (12/28/20 0524)  . heparin 1,250 Units/hr (12/28/20 0947)   . aspirin EC  81 mg Oral Daily  . Chlorhexidine Gluconate Cloth  6 each Topical Daily  . epoetin (EPOGEN/PROCRIT) injection  8,000 Units Intravenous Q T,Th,Sa-HD  . gabapentin  100 mg Oral QHS  . levothyroxine  300 mcg Oral QHS  . metoprolol tartrate  12.5 mg Oral Q6H  . midodrine  10 mg Oral TID WC  . pantoprazole  40 mg Oral QHS  . sevelamer carbonate  3,200 mg Oral TID WC  . sodium chloride flush  10-40 mL Intracatheter Q12H  . ticagrelor  90 mg Oral BID   acetaminophen, albuterol, HYDROcodone-acetaminophen, morphine injection, ondansetron **OR** ondansetron (ZOFRAN) IV, senna-docusate, sevelamer carbonate, sodium chloride flush  Assessment/ Plan:  Lori [name] is a 50 y.o. white female with end stage renal disease on hemodialysis, coronary artery disease, hypertension, peripheral vascular disease, hypothyroidism, who was admitted to [facility] on 12/20/2020 for Cellulitis of fourth toe of left foot [L03.032]  Patient initially admitted to [facility]. Patient transferred to [facility] for vascular services. Patient with incidental COVID diagnosis. 12/14/2020  [facility] MWF [facility] RIJ permcath 101.5kg  1. End Stage Renal Disease: Keep on TTS schedule due to COVID diagnosis. BP in low 90's. HR in 130's Patient asymptomatic HD planned for thursday  2.  Covid-19 infection Tested positive 1/19 Currently maintained on isolation  3. Anemia of chronic kidney disease:   -  Lab Results  Component Value Date   HGB 8.0 (L) 12/26/2020   EPO with HD  4. Secondary Hyperparathyroidism:  - Sevelamer with meals  5. Peripheral vascular disease: with ischemic changes. Angiogram on 1/27 by Dr. Lucky Cowboy with balloon angioplasty, thrombectomy and two stents placed to SFA and left popliteal.  - ticagrelor -podiatry team also  following  6. A Fib with RVR Plan as per IM /cardiology team   LOS: 8 Shanyn Preisler 2/2/202210:23 AM

## 2020-12-28 NOTE — Progress Notes (Signed)
[facility] NAME: Lori [name]    MR#:  [id number]  DATE OF BIRTH:  [birth year]  SUBJECTIVE:   Feeling fatigued. Right foot hurts when moved. Denies dyspnea or cough. No chest pain. No abd pain.  REVIEW OF SYSTEMS:   Review of Systems  Constitutional: Positive for malaise/fatigue. Negative for chills, fever and weight loss.  HENT: Negative for ear discharge, ear pain and nosebleeds.   Eyes: Negative for blurred vision, pain and discharge.  Respiratory: Negative for sputum production, shortness of breath, wheezing and stridor.   Cardiovascular: Negative for chest pain, palpitations, orthopnea and PND.  Gastrointestinal: Negative for abdominal pain, diarrhea, nausea and vomiting.  Genitourinary: Negative for frequency and urgency.  Musculoskeletal: Negative for back pain and joint pain.  Neurological: Positive for weakness. Negative for sensory change, speech change and focal weakness.  Psychiatric/Behavioral: Negative for depression and hallucinations. The patient is not nervous/anxious.    Tolerating Diet:yes Tolerating PT:   DRUG ALLERGIES:   Allergies  Allergen Reactions  . Activase [Alteplase] Shortness Of Breath  . Bee Pollen Anaphylaxis  . Warfarin Sodium Nausea And Vomiting and Rash    VITALS:  Blood pressure 102/80, pulse 97, temperature 97.7 F (36.5 C), temperature source Axillary, resp. rate 12, height '5\' 3"'$  (1.6 m), weight 110.8 kg, SpO2 97 %.  PHYSICAL EXAMINATION:   Physical Exam  GENERAL:  50 y.o.-year-old patient lying in the bed with no acute distress. Appears chronically ill LUNGS: Normal breath sounds bilaterally, no wheezing, rales, rhonchi. No use of accessory muscles of respiration.  CARDIOVASCULAR: S1, S2 normal. No murmurs, rubs, or gallops. Tachycardia/a fib ABDOMEN: Soft, nontender, nondistended. Bowel sounds present. EXTREMITIES:    NEUROLOGIC: moves all extremities well. Decreased sensation  in feet.   PSYCHIATRIC:  patient is alert and oriented x 3.  SKIN: as above  LABORATORY PANEL:  CBC Recent Labs  Lab 12/28/20 1014  WBC 15.3*  HGB 7.5*  HCT 23.0*  PLT 182    Chemistries  Recent Labs  Lab 12/26/20 0600  NA 134*  K 5.1  CL 97*  CO2 25  GLUCOSE 107*  BUN 50*  CREATININE 9.01*  CALCIUM 8.5*  MG 1.7   Cardiac Enzymes No results for input(s): TROPONINI in the last 168 hours. RADIOLOGY:  No results found. ASSESSMENT AND PLAN:  50 y.o.femalewith medical history significant forCAD s/p stent to LAD in 2020, ESRD on HD MWF, hypertension, hypothyroidism, hyperparathyroidism, GERD who was initially admitted to [facility] on 12/14/2020 for a left foot wound.  She was started on antibiotics.  General surgery evaluated the patient and opined that she may ultimately need a left AKA.  Patient wanted to discuss with her vascular surgeon Dr. Delana Meyer before any surgery would be considered.  She was transferred to [facility] for further evaluation   Rapid a fib with RVR/hypotension/questionable septic shock in the setting of ESRB and left foot infection Patient was transferred to ICU on 30 January. she received bolus of amiodarone, digoxin, IV Cardizem drip now off. blood pressure remains soft requiring IV Levophed, no discontinued on midodrine. Cardiology following, per them cardioversion not safe - cont midodrine - lopressor q6 - avoiding digoxin given esrd - brillinta on hold given declining h/h - continue asa, heparin gtt  Left foot wound infection with gangrene/chronic ischemia of the left lower extremity Dry necrotic/ischemic appearance of left fourth and fifth toes. [name]. Status post aortogram and  left lower extremity angiogram with angioplasty of left peroneal artery/left SFA and popliteal arteries along with mechanical thrombectomy and stent placement of the left SFA and popliteal artery on 12/22/2020.  Vascular and podiatry following.  - vascular surgery planning angiogram tomorrow -Antibiotics: cefepime/flagyl 1/26-1/27, zosyn 1/27-2/1, unasyn 2/1>. Wound culture has grown Enterococcus faecalis and Proteus vulgaris. Prior provider discussed abx regimen w/ ID pharmacist. Plan for total 10 days tx. --now on IV unasyn (d/w ID pharmacist)--will rx for total 10 days (through 2/5)  End-stage renal disease on hemodialysis -Nephrology following.  Continue dialysis as per nephrology schedule t/t/s  Anemia of chronic disease H downtrending to 7.5 today. No signs of overt bleeding. - brillinta on hold, asa and heparin gtt continuing - transfuse for h less than 7, if drops below that would also further w/u source of bleeding  COVID-19 positive -Incidentally Covid + 12/14/2020. She has been asymptomatic. She states she has been fully vaccinated including booster dose. She received 3 days IV remdesivir. Currently no need for further treatment.  -Still on room air.  CAD: s/p multiple stents -Continue aspirin, Coreg and statin --follows with Dr Skipper [facility]liche [facility])  Hypertension: with relative hypotension -Monitor blood pressure.  Continue Coreg  Hypothyroidism: -Continue Synthroid   DVT prophylaxis: Heparin Code Status: Full Family Communication: None Disposition Plan: Status is: Inpatient  Remains inpatient appropriate because:Inpatient level of care appropriate due to severity of illness   Dispo: The patient is from: Home  Anticipated d/c is to: Home  Anticipated d/c date is: > 3 days  Patient currently is not medically stable to d/c.              Difficult to place patient No  Consultants: Vascular surgery/nephrology/podiatry/ cardilogy  Procedures: aortogram and left lower extremity angiogram with angioplasty of left peroneal artery/left SFA and popliteal arteries along with mechanical thrombectomy and stent placement of the left SFA and  popliteal artery on 12/22/2020    Level of care: Progressive Cardiac Status is: Inpatient    TOTAL TIME TAKING CARE OF THIS PATIENT: 46 min    Desma Maxim M.D    [facility]

## 2020-12-28 NOTE — Progress Notes (Signed)
Progress Note  Patient Name: Lori [name] Date of Encounter: 12/28/2020  [facility] Cardiologist: Dr. Terrence Dupont  Subjective   Feels okay, denies palpitations, shortness of breath. Had recent peripheral artery/SFA and proximal left popliteal stenting.  Inpatient Medications    Scheduled Meds: . aspirin EC  81 mg Oral Daily  . Chlorhexidine Gluconate Cloth  6 each Topical Daily  . epoetin (EPOGEN/PROCRIT) injection  8,000 Units Intravenous Q T,Th,Sa-HD  . gabapentin  100 mg Oral QHS  . levothyroxine  300 mcg Oral QHS  . metoprolol tartrate  12.5 mg Oral Q6H  . midodrine  10 mg Oral TID WC  . pantoprazole  40 mg Oral QHS  . sevelamer carbonate  3,200 mg Oral TID WC  . sodium chloride flush  10-40 mL Intracatheter Q12H  . ticagrelor  90 mg Oral BID   Continuous Infusions: . sodium chloride Stopped (12/26/20 0000)  . ampicillin-sulbactam (UNASYN) IV 3 g (12/28/20 0524)  . heparin 1,250 Units/hr (12/28/20 0947)   PRN Meds: acetaminophen, albuterol, HYDROcodone-acetaminophen, morphine injection, ondansetron **OR** ondansetron (ZOFRAN) IV, senna-docusate, sevelamer carbonate, sodium chloride flush   Vital Signs    Vitals:   12/28/20 0800 12/28/20 0900 12/28/20 1000 12/28/20 1100  BP:  102/89 98/63 (!) 84/69  Pulse:  99 (!) 38 (!) 29  Resp: '11 19 20 17  '$ Temp:      TempSrc:      SpO2:  (!) 87% 94% 100%  Weight:      Height:        Intake/Output Summary (Last 24 hours) at 12/28/2020 1326 Last data filed at 12/28/2020 0800 Gross per 24 hour  Intake 871.27 ml  Output 0 ml  Net 871.27 ml   Last 3 Weights 12/23/2020 12/22/2020 12/21/2020  Weight (lbs) 244 lb 4.3 oz 242 lb 8.1 oz 240 lb 12.8 oz  Weight (kg) 110.8 kg 110 kg 109.226 kg      Telemetry    A. fib heart rate 112-130- Personally Reviewed  ECG    No new tracing obtained- Personally Reviewed  Physical Exam   GEN: No acute distress.   Neck: No JVD Cardiac:  Irregular irregular, tachycardic Respiratory:   Decreased breath sounds at bases GI: Soft, nontender, distended  MS:  Left foot in dressing Neuro:  Nonfocal  Psych: Normal affect   Labs    High Sensitivity Troponin:  No results for input(s): TROPONINIHS in the last 720 hours.    Chemistry Recent Labs  Lab 12/24/20 0820 12/25/20 0700 12/26/20 0025 12/26/20 0600  NA 138 135 135 134*  K 4.9 4.6 4.7 5.1  CL 97* 98 98 97*  CO2 '25 25 25 25  '$ GLUCOSE 87 86 142* 107*  BUN 50* 37* 46* 50*  CREATININE 9.75* 7.45* 8.07* 9.01*  CALCIUM 8.7* 8.4* 7.8* 8.5*  ALBUMIN 3.2*  --   --   --   GFRNONAA 4* 6* 6* 5*  ANIONGAP 16* '12 12 12     '$ Hematology Recent Labs  Lab 12/25/20 0700 12/26/20 0600 12/28/20 1014  WBC 12.8* 14.3* 15.3*  RBC 2.67* 2.54* 2.34*  HGB 8.4* 8.0* 7.5*  HCT 27.1* 25.5* 23.0*  MCV 101.5* 100.4* 98.3  MCH 31.5 31.5 32.1  MCHC 31.0 31.4 32.6  RDW 16.2* 16.2* 15.9*  PLT 150 167 182    BNPNo results for input(s): BNP, PROBNP in the last 168 hours.   DDimer No results for input(s): DDIMER in the last 168 hours.   Radiology  No results found.  Cardiac Studies   Echo 12/16/2020 1. Left ventricular ejection fraction, by estimation, is 55 to 60%. The  left ventricle has normal function. Left ventricular endocardial border  not optimally defined to evaluate regional wall motion. There is moderate  left ventricular hypertrophy. Left  ventricular diastolic parameters are consistent with Grade II diastolic  dysfunction (pseudonormalization).  2. Right ventricular systolic function is normal. The right ventricular  size is normal. Tricuspid regurgitation signal is inadequate for assessing  PA pressure.  3. Left atrial size was severely dilated.  4. The mitral valve is abnormal. Trivial mitral valve regurgitation.  Moderate to severe mitral annular calcification.  5. The aortic valve is tricuspid. There is moderate calcification of the  aortic valve. Aortic valve regurgitation is not visualized. Mild to   moderate aortic valve sclerosis/calcification is present, without any  evidence of aortic stenosis. Aortic valve  mean gradient measures 8.3 mmHg. Aortic valve Vmax measures 2.02 m/s.  6. The inferior vena cava is normal in size with greater than 50%  respiratory variability, suggesting right atrial pressure of 3 mmHg.   Patient Profile     50 y.o. female history of CAD, (prior PCI's to LAD, RCA, OM), PAD s/p recent left SFA proximal popliteal stenting (12/22/2020), ESRD on HD MWF, left foot infection being seen for A. fib with RVR  Assessment & Plan    1. Persistent A. Fib -Heart rates still elevated, low blood pressures preventing titration of meds. -Lopressor 12.5 mg every 6 -Avoiding digoxin due to renal dysfunction -Due to low blood pressures, TEE cardioversion not safe as stated if medications will worsen hypotension. Also patient has decreasing hemoglobin levels and anticoagulation might have to be held. -Hg 7.5 this AM. Will hold Brilinta, continue aspirin and heparin drip. -Midodrine for BP support  2. PAD, recent left SFA and left proximal popliteal stenting -Stop Brilinta due to dropping H&H -Continue aspirin heparin for now. Unfortunately may have to stop other antiplatelet/anticoagulants if H&H keeps decreasing. Discussed in detail with patient.  3. CAD, prior PCI -Start Lipitor, aspirin as above  4. Left foot infection -Antibiotics, management as per vascular as per primary team  5. End-stage renal disease -Per nephrology  Total encounter time 35 minutes  Greater than 50% was spent in counseling and coordination of care with the patient       Signed, Kate Sable, MD  12/28/2020, 1:26 PM

## 2020-12-28 NOTE — Consult Note (Signed)
Consultation Note Date: 12/28/2020   Patient Name: Lori [name]  DOB: 1971/05/08  MRN: [id number]  Age / Sex: 50 y.o., female  PCP: Patient, No Pcp Per Referring Physician: Gwynne Edinger, MD  Reason for Consultation: Establishing goals of care  HPI/Patient Profile: 50 y.o. female  with past medical history of CAD s/p stent, ESRD on HD MWF, calciphylaxis, hypertension, hypothyroidism, GERD, and wounds admitted on 12/20/2020 with left foot wound. Hospital course complicated by a fib RVR and hypotension. Status post aortogram and left lower extremity angiogram with angioplasty of left peroneal artery/left SFA and popliteal arteries along with mechanical thrombectomy and stent placement of the left SFA and popliteal artery on 12/22/2020. Found to be COVID + but asymptomatic. PMT consulted to discuss [place].   Clinical Assessment and Goals of Care: I have reviewed medical records including EPIC notes, labs and imaging, received report from RN, assessed the patient and then met with patient  to discuss diagnosis prognosis, GOC, EOL wishes, disposition and options.  Patient recently seen by palliative team during her hospitalization at [facility] - visited by Vinie Sill, NP on 9/56/38. During that visit she shares a desire for full code, full scope interventions.   I introduced Palliative Medicine as specialized medical care for people living with serious illness. It focuses on providing relief from the symptoms and stress of a serious illness. The goal is to improve quality of life for both the patient and the family. Amani remembers palliative care visit from ~2 weeks ago.   Sanaia tells me she has a good understanding of her illness and treatment decisions she faces. She shares that her wishes have not changed - she is interested in all medical interventions offered to prolong life, including amputation. She also shares desire for full code status. She feels  her family has a good understanding of her wishes.  Discussed with patient the importance of continued conversation with family and the medical providers regarding overall plan of care and treatment options, ensuring decisions are within the context of the patient's values and GOCs.    Questions and concerns were addressed. The family was encouraged to call with questions or concerns.   Primary Decision Maker PATIENT    SUMMARY OF RECOMMENDATIONS   - full scope/full code - consider outpatient palliative referral at discharge   Code Status/Advance Care Planning:  Full code  Discharge Planning: To Be Determined      Primary Diagnoses: Present on Admission: . Cellulitis of fourth toe of left foot . Atrial fibrillation ([place]) . Hypothyroidism   I have reviewed the medical record, interviewed the patient and family, and examined the patient. The following aspects are pertinent.  Past Medical History:  Diagnosis Date  . Anemia   . ASCVD (arteriosclerotic cardiovascular disease)    a. s/p prior stenting of LAD b. NSTEMI in 01/2018 requiring DES x3 to RCA 2/2 spiral dissection from ost->mid RCA but residual dzs in dRCA, LAD & OM2; c. 09/2019 Cath/PCI: LM nl, LAD 50 ISR, 65 p/m, 70m(2.5x15 Resolute Onyx DES), 90d, LCX 99d, OM2 99 (2.25x26 Resolute Onyx DES), RCA 40p/257mSR, 85d, RPDA 100ost, RPAV 50. EF 50-55%.  . Calciphylaxis   . Chronic abdominal wound infection   . COVID-19 virus infection 11/2020  . [place]  . Diastolic dysfunction    a. 11/2020 Echo: EF 55-60%, mod conc LVh, gr2 DD, nl RV size/fxn. Sev dil LA. Triv MR. Mod-Sev mitral annular Ca2+. Mild-mod Ao sclerosis w/o stenosis.  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 peitonitis; difficult vascular access-->HD MWF.  Marland Kitchen Gangrene of left foot ([place])    a. 11/2020 L forefoot dry gangrene involving 4th and 5th MTP joints & digits, & R 5th MTP  joint.  Marland Kitchen GERD (gastroesophageal reflux disease)   . Hashimoto thyroiditis   . Hyperparathyroidism   . Hypertension   . Hypothyroidism   . Medically noncompliant   . Morbid obesity ([place])   . PAD (peripheral artery disease) ([place])    a. 11/2020 PTA of L peroneal, PTA/thrombectomy, Viabahn stenting x 2 to the L SFA and popliteal arteries (6x250m & 6x152m.  . Persistent atrial fibrillation ([facility]   a. Noted during hospitalization 11/2020.   Social History   Socioeconomic History  . Marital status: Married    Spouse name: Not on file  . Number of children: Not on file  . Years of education: Not on file  . Highest education level: Not on file  Occupational History  . Not on file  Tobacco Use  . Smoking status: Former SmResearch scientist (life sciences). Smokeless tobacco: Never Used  Vaping Use  . Vaping Use: Never used  Substance and Sexual Activity  . Alcohol use: No  . Drug use: No  . Sexual activity: Yes    Birth control/protection: Surgical  Other Topics Concern  . Not on file  Social History Narrative   Married   No regular exercise   Social Determinants of Health   Financial Resource Strain: Not on file  Food Insecurity: Not on file  Transportation Needs: Not on file  Physical Activity: Not on file  Stress: Not on file  Social Connections: Not on file   Family History  Problem Relation Age of Onset  . Coronary artery disease Mother   . Kidney disease Father   . Diabetes Sister    Scheduled Meds: . aspirin EC  81 mg Oral Daily  . [START ON 12/29/2020] atorvastatin  40 mg Oral Daily  . Chlorhexidine Gluconate Cloth  6 each Topical Daily  . epoetin (EPOGEN/PROCRIT) injection  8,000 Units Intravenous Q T,Th,Sa-HD  . gabapentin  100 mg Oral QHS  . levothyroxine  300 mcg Oral QHS  . metoprolol tartrate  12.5 mg Oral Q6H  . midodrine  10 mg Oral TID WC  . pantoprazole  40 mg Oral QHS  . sevelamer carbonate  3,200 mg Oral TID WC  . sodium chloride flush  10-40 mL Intracatheter Q12H    Continuous Infusions: . sodium chloride Stopped (12/26/20 0000)  . ampicillin-sulbactam (UNASYN) IV 3 g (12/28/20 0524)  . heparin 1,250 Units/hr (12/28/20 0947)   PRN Meds:.acetaminophen, albuterol, HYDROcodone-acetaminophen, morphine injection, ondansetron **OR** ondansetron (ZOFRAN) IV, senna-docusate, sevelamer carbonate, sodium chloride flush Allergies  Allergen Reactions  . Activase [Alteplase] Shortness Of Breath  . Bee Pollen Anaphylaxis  . Warfarin Sodium Nausea And Vomiting and Rash   Review of Systems  Constitutional: Positive for activity change and fatigue. Negative for appetite change.  Musculoskeletal:       L foot pain    Physical Exam Constitutional:      General: She is not in acute distress. Cardiovascular:     Rate and Rhythm: Tachycardia present.  Pulmonary:     Effort: Pulmonary effort is normal. No respiratory distress.  Skin:    General: Skin is warm and dry.  Neurological:     Mental Status: She is alert and oriented to  person, place, and time.  Psychiatric:        Mood and Affect: Mood normal.        Behavior: Behavior normal.     Vital Signs: BP 102/80   Pulse 97   Temp 97.7 F (36.5 C) (Axillary)   Resp 12   Ht 5' 3"  (1.6 m)   Wt 110.8 kg   SpO2 97%   BMI 43.27 kg/m  Pain Scale: 0-10   Pain Score: 0-No pain   SpO2: SpO2: 97 % O2 Device:SpO2: 97 % O2 Flow Rate: .O2 Flow Rate (L/min): 2 L/min  IO: Intake/output summary:   Intake/Output Summary (Last 24 hours) at 12/28/2020 1609 Last data filed at 12/28/2020 0800 Gross per 24 hour  Intake 871.27 ml  Output 0 ml  Net 871.27 ml    LBM: Last BM Date: 12/19/20 Baseline Weight: Weight: 102.9 kg Most recent weight: Weight: 110.8 kg     Palliative Assessment/Data: PPS 40%    Time Total: 45 minutes Greater than 50%  of this time was spent counseling and coordinating care related to the above assessment and plan.  Juel Burrow, DNP, AGNP-C Palliative Medicine  Team [phone] Pager: [phone]

## 2020-12-28 NOTE — Consult Note (Signed)
[place] for Heparin  Indication: atrial fibrillation  Allergies  Allergen Reactions  . Activase [Alteplase] Shortness Of Breath  . Bee Pollen Anaphylaxis  . Warfarin Sodium Nausea And Vomiting and Rash    Patient Measurements: Height: '5\' 3"'$  (160 cm) Weight: 110.8 kg (244 lb 4.3 oz) IBW/kg (Calculated) : 52.4 Heparin Dosing Weight: 76.7 kg  Vital Signs: Temp: 97.7 F (36.5 C) (02/02 0000) Temp Source: Axillary (02/02 0000) BP: 90/69 (02/02 0000) Pulse Rate: 101 (02/02 0000)  Labs: Recent Labs    12/25/20 0700 12/26/20 0025 12/26/20 0600 12/27/20 2351  HGB 8.4*  --  8.0*  --   HCT 27.1*  --  25.5*  --   PLT 150  --  167  --   HEPARINUNFRC  --   --   --  0.30  CREATININE 7.45* 8.07* 9.01*  --     Estimated Creatinine Clearance: 8.8 mL/min (A) (by C-G formula based on SCr of 9.01 mg/dL (H)).   Medical History: Past Medical History:  Diagnosis Date  . Anemia   . ASCVD (arteriosclerotic cardiovascular disease)    a. s/p prior stenting of LAD b. NSTEMI in 01/2018 requiring DES x3 to RCA 2/2 spiral dissection from ost->mid RCA but residual dzs in dRCA, LAD & OM2; c. 09/2019 Cath/PCI: LM nl, LAD 50 ISR, 65 p/m, 41m(2.5x15 Resolute Onyx DES), 90d, LCX 99d, OM2 99 (2.25x26 Resolute Onyx DES), RCA 40p/231mSR, 85d, RPDA 100ost, RPAV 50. EF 50-55%.  . Calciphylaxis   . Chronic abdominal wound infection   . COVID-19 virus infection 11/2020  . Dialysis patient ([facility]  . Diastolic dysfunction    a. 11/2020 Echo: EF 55-60%, mod conc LVh, gr2 DD, nl RV size/fxn. Sev dil LA. Triv MR. Mod-Sev mitral annular Ca2+. Mild-mod Ao sclerosis w/o stenosis.  . 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 peitonitis; difficult vascular access-->HD MWF.  . Marland [place]angrene of left foot ([facility]   a. 11/2020 L forefoot dry gangrene involving 4th and 5th MTP joints & digits, & R 5th MTP joint.  . Marland KitchenERD  (gastroesophageal reflux disease)   . Hashimoto thyroiditis   . Hyperparathyroidism   . Hypertension   . Hypothyroidism   . Medically noncompliant   . Morbid obesity ([facility]  . PAD (peripheral artery disease) ([facility]   a. 11/2020 PTA of L peroneal, PTA/thrombectomy, Viabahn stenting x 2 to the L SFA and popliteal arteries (6x25037m 6x150m47m . Persistent atrial fibrillation ([facility])[place] a. Noted during hospitalization 11/2020.    Medications:  Medications Prior to Admission  Medication Sig Dispense Refill Last Dose  . albuterol (PROVENTIL HFA;VENTOLIN HFA) 108 (90 BASE) MCG/ACT inhaler Inhale 2 puffs into the lungs every 6 (six) hours as needed. Shortness of breath   Past Week at Unknown time  . aspirin EC 81 MG tablet Take 81 mg by mouth daily. Swallow whole.   12/20/2020 at Unknown time  . b complex-vitamin c-folic acid (NEPHRO-VITE) 0.8 MG TABS tablet Take 1 tablet by mouth daily.   12/20/2020 at Unknown time  . calcitRIOL (ROCALTROL) 0.25 MCG capsule Take 3 capsules (0.75 mcg total) by mouth every Monday, Wednesday, and Friday with hemodialysis.   12/20/2020 at Unknown time  . ceFEPIme 2 g in sodium chloride 0.9 % 100 mL Inject 2 g into the vein every Monday, Wednesday, and Friday with hemodialysis.   12/20/2020  at Unknown time  . gabapentin (NEURONTIN) 100 MG capsule Take 1 capsule (100 mg total) by mouth at bedtime.   12/19/2020 at Unknown time  . HYDROcodone-acetaminophen (NORCO/VICODIN) 5-325 MG tablet TAKE (1) TABLET BY MOUTH EVERY (6) HOURS AS NEEDED. 18 tablet 0 12/20/2020 at Unknown time  . levothyroxine (SYNTHROID, LEVOTHROID) 300 MCG tablet Take 300 mcg by mouth at bedtime. SYNTHROID ONLY-BRAND NAME MEDICALLY NECESSARY   12/19/2020 at Unknown time  . metroNIDAZOLE (FLAGYL) 500 MG tablet Take 1 tablet (500 mg total) by mouth every 8 (eight) hours.   12/19/2020 at Unknown time  . midodrine (PROAMATINE) 10 MG tablet Take 1 tablet (10 mg total) by mouth 3 (three) times daily with meals.   Past  Month at Unknown time  . nitroGLYCERIN (NITROSTAT) 0.4 MG SL tablet Place 1 tablet (0.4 mg total) under the tongue every 5 (five) minutes x 3 doses as needed for chest pain. 25 tablet 12 Past Week at Unknown time  . nystatin (MYCOSTATIN/NYSTOP) powder Apply topically 2 (two) times daily. 15 g 0 12/20/2020 at Unknown time  . pantoprazole (PROTONIX) 40 MG tablet Take 1 tablet (40 mg total) by mouth at bedtime.   12/20/2020 at Unknown time  . polyethylene glycol (MIRALAX / GLYCOLAX) 17 g packet Take 17 g by mouth 2 (two) times daily. 14 each 0 12/19/2020 at Unknown time  . sevelamer carbonate (RENVELA) 800 MG tablet Take 1,600-3,200 mg by mouth 3 (three) times daily with meals. '3200mg'$  with meals and '1600mg'$  with snacks   12/20/2020 at Unknown time  . ticagrelor (BRILINTA) 90 MG TABS tablet Take 1 tablet (90 mg total) by mouth 2 (two) times daily. 60 tablet 3 12/20/2020 at Unknown time  . vancomycin (VANCOCIN) 1-5 GM/200ML-% SOLN Inject 200 mLs (1,000 mg total) into the vein every Monday, Wednesday, and Friday with hemodialysis. 4000 mL  12/19/2020 at Unknown time  . EPINEPHrine 0.3 mg/0.3 mL IJ SOAJ injection Inject 0.3 mg into the muscle as needed.     . fentaNYL (SUBLIMAZE) 100 MCG/2ML injection Inject 0.25 mLs (12.5 mcg total) into the vein every 3 (three) hours as needed for severe pain. 2 mL 0    Scheduled:  . aspirin EC  81 mg Oral Daily  . Chlorhexidine Gluconate Cloth  6 each Topical Daily  . epoetin (EPOGEN/PROCRIT) injection  8,000 Units Intravenous Q T,Th,Sa-HD  . gabapentin  100 mg Oral QHS  . levothyroxine  300 mcg Oral QHS  . metoprolol tartrate  12.5 mg Oral Q6H  . midodrine  10 mg Oral TID WC  . pantoprazole  40 mg Oral QHS  . sevelamer carbonate  3,200 mg Oral TID WC  . sodium chloride flush  10-40 mL Intracatheter Q12H  . ticagrelor  90 mg Oral BID   Infusions:  . sodium chloride Stopped (12/26/20 0000)  . ampicillin-sulbactam (UNASYN) IV 3 g (12/27/20 1747)  . heparin 1,000  Units/hr (12/27/20 2147)   PRN: acetaminophen, albuterol, HYDROcodone-acetaminophen, morphine injection, ondansetron **OR** ondansetron (ZOFRAN) IV, senna-docusate, sevelamer carbonate, sodium chloride flush Anti-infectives (From admission, onward)   Start     Dose/Rate Route Frequency Ordered Stop   12/27/20 1800  Ampicillin-Sulbactam (UNASYN) 3 g in sodium chloride 0.9 % 100 mL IVPB        3 g 200 mL/hr over 30 Minutes Intravenous Every 12 hours 12/27/20 1326 12/30/20 1759   12/22/20 1400  piperacillin-tazobactam (ZOSYN) IVPB 2.25 g  Status:  Discontinued        2.25 g 100  mL/hr over 30 Minutes Intravenous Every 8 hours 12/22/20 1314 12/27/20 1326   12/22/20 1330  ceFAZolin (ANCEF) IVPB 1 g/50 mL premix       Note to Pharmacy: To be given in specials   1 g 100 mL/hr over 30 Minutes Intravenous 60 min pre-op 12/22/20 0052 12/22/20 1607   12/22/20 1257  ceFAZolin (ANCEF) 1-4 GM/50ML-% IVPB       Note to Pharmacy: Corlis Hove   : cabinet override      12/22/20 1257 12/22/20 1610   12/22/20 1200  vancomycin (VANCOCIN) IVPB 1000 mg/200 mL premix  Status:  Discontinued        1,000 mg 200 mL/hr over 60 Minutes Intravenous Every T-Th-Sa (Hemodialysis) 12/22/20 1128 12/22/20 1314   12/22/20 0145  ceFAZolin (ANCEF) IVPB 1 g/50 mL premix  Status:  Discontinued       Note to Pharmacy: To be given in specials   1 g 100 mL/hr over 30 Minutes Intravenous  Once 12/22/20 0048 12/22/20 0050   12/21/20 1200  ceFEPIme (MAXIPIME) 2 g in sodium chloride 0.9 % 100 mL IVPB  Status:  Discontinued        2 g 200 mL/hr over 30 Minutes Intravenous Every M-W-F (Hemodialysis) 12/20/20 2038 12/22/20 1314   12/21/20 1200  vancomycin (VANCOCIN) IVPB 1000 mg/200 mL premix  Status:  Discontinued        1,000 mg 200 mL/hr over 60 Minutes Intravenous Every M-W-F (Hemodialysis) 12/20/20 2038 12/22/20 1128   12/20/20 2200  metroNIDAZOLE (FLAGYL) tablet 500 mg  Status:  Discontinued        500 mg Oral Every 8 hours  12/20/20 2038 12/22/20 1314      Assessment: Pharmacy consulted to start heparin for afib. No DOAC PTA noted.   Goal of Therapy:  Heparin level 0.3-0.7 units/ml Monitor platelets by anticoagulation protocol: Yes   Plan:  2/1 @ 2351:  HL = 0.3 Will continue pt on current rate and draw confirmation level on 2/2 @ 0800.   Querida Beretta D 12/28/2020,1:02 AM

## 2020-12-28 NOTE — Consult Note (Signed)
[place] for Heparin  Indication: atrial fibrillation  Allergies  Allergen Reactions  . Activase [Alteplase] Shortness Of Breath  . Bee Pollen Anaphylaxis  . Warfarin Sodium Nausea And Vomiting and Rash    Patient Measurements: Height: '5\' 3"'$  (160 cm) Weight: 110.8 kg (244 lb 4.3 oz) IBW/kg (Calculated) : 52.4 Heparin Dosing Weight: 76.7 kg  Vital Signs: Temp: 97.8 F (36.6 C) (02/02 1600) Temp Source: Axillary (02/02 1600) BP: 111/91 (02/02 1800) Pulse Rate: 67 (02/02 1800)  Labs: Recent Labs    12/26/20 0025 12/26/20 0600 12/27/20 2351 12/28/20 0837 12/28/20 1014 12/28/20 1846  HGB  --  8.0*  --   --  7.5*  --   HCT  --  25.5*  --   --  23.0*  --   PLT  --  167  --   --  182  --   HEPARINUNFRC  --   --  0.30 <0.10*  --  0.22*  CREATININE 8.07* 9.01*  --   --   --   --     Estimated Creatinine Clearance: 8.8 mL/min (A) (by C-G formula based on SCr of 9.01 mg/dL (H)).   Medical History: Past Medical History:  Diagnosis Date  . Anemia   . ASCVD (arteriosclerotic cardiovascular disease)    a. s/p prior stenting of LAD b. NSTEMI in 01/2018 requiring DES x3 to RCA 2/2 spiral dissection from ost->mid RCA but residual dzs in dRCA, LAD & OM2; c. 09/2019 Cath/PCI: LM nl, LAD 50 ISR, 65 p/m, 59m(2.5x15 Resolute Onyx DES), 90d, LCX 99d, OM2 99 (2.25x26 Resolute Onyx DES), RCA 40p/259mSR, 85d, RPDA 100ost, RPAV 50. EF 50-55%.  . Calciphylaxis   . Chronic abdominal wound infection   . COVID-19 virus infection 11/2020  . Dialysis patient ([facility]  . Diastolic dysfunction    a. 11/2020 Echo: EF 55-60%, mod conc LVh, gr2 DD, nl RV size/fxn. Sev dil LA. Triv MR. Mod-Sev mitral annular Ca2+. Mild-mod Ao sclerosis w/o stenosis.  . Marland KitchenSRD (end stage renal disease) ([facility]   Due to membranous GN dialysis 09/1996; peritoneal dialysis --? peitonitis; difficult vascular access-->HD MWF.  . Marland Kitchenangrene of left foot ([facility]   a. 11/2020 L forefoot dry  gangrene involving 4th and 5th MTP joints & digits, & R 5th MTP joint.  . Marland KitchenERD (gastroesophageal reflux disease)   . Hashimoto thyroiditis   . Hyperparathyroidism   . Hypertension   . Hypothyroidism   . Medically noncompliant   . Morbid obesity (HCAd[facility]  . PAD (peripheral artery disease) ([facility]   a. 11/2020 PTA of L peroneal, PTA/thrombectomy, Viabahn stenting x 2 to the L SFA and popliteal arteries (6x25021m 6x150m57m . Persistent atrial fibrillation (HCC)[place] a. Noted during hospitalization 11/2020.    Assessment: Pharmacy consulted to start heparin for afib. No DOAC PTA noted.   2/2 Hgb 7.5: brillinta held   Goal of Therapy:  Heparin level 0.3-0.7 units/ml Monitor platelets by anticoagulation protocol: Yes   2/1 2351 HL 0.3, therapeutic 2/2 0837 HL <0.10, subtherapeutic --2200 unit bolus followed by increase in heparin drip to 1250 units/hr 2/2 1846 HL 0.22, subtherapeutic  Plan:  Heparin subtherapeutic. Will give Heparin 1150 unit bolus followed by increase in heparin drip to 1450 units/hr. Recheck HL in 8 hours. CBC daily while on heparin drip.  CariDorothe PeaarmD, BCPS Clinical Pharmacist  12/28/2020,7:25 PM

## 2020-12-28 NOTE — Consult Note (Signed)
[place] for Heparin  Indication: atrial fibrillation  Allergies  Allergen Reactions  . Activase [Alteplase] Shortness Of Breath  . Bee Pollen Anaphylaxis  . Warfarin Sodium Nausea And Vomiting and Rash    Patient Measurements: Height: '5\' 3"'$  (160 cm) Weight: 110.8 kg (244 lb 4.3 oz) IBW/kg (Calculated) : 52.4 Heparin Dosing Weight: 76.7 kg  Vital Signs: Temp: 97.7 F (36.5 C) (02/02 0000) Temp Source: Axillary (02/02 0000) BP: 105/77 (02/02 0700) Pulse Rate: 32 (02/02 0700)  Labs: Recent Labs    12/26/20 0025 12/26/20 0600 12/27/20 2351 12/28/20 0837  HGB  --  8.0*  --   --   HCT  --  25.5*  --   --   PLT  --  167  --   --   HEPARINUNFRC  --   --  0.30 <0.10*  CREATININE 8.07* 9.01*  --   --     Estimated Creatinine Clearance: 8.8 mL/min (A) (by C-G formula based on SCr of 9.01 mg/dL (H)).   Medical History: Past Medical History:  Diagnosis Date  . Anemia   . ASCVD (arteriosclerotic cardiovascular disease)    a. s/p prior stenting of LAD b. NSTEMI in 01/2018 requiring DES x3 to RCA 2/2 spiral dissection from ost->mid RCA but residual dzs in dRCA, LAD & OM2; c. 09/2019 Cath/PCI: LM nl, LAD 50 ISR, 65 p/m, 41m(2.5x15 Resolute Onyx DES), 90d, LCX 99d, OM2 99 (2.25x26 Resolute Onyx DES), RCA 40p/245mSR, 85d, RPDA 100ost, RPAV 50. EF 50-55%.  . Calciphylaxis   . Chronic abdominal wound infection   . COVID-19 virus infection 11/2020  . Dialysis patient (HCNees[facility]  . Diastolic dysfunction    a. 11/2020 Echo: EF 55-60%, mod conc LVh, gr2 DD, nl RV size/fxn. Sev dil LA. Triv MR. Mod-Sev mitral annular Ca2+. Mild-mod Ao sclerosis w/o stenosis.  . Marland KitchenSRD (end stage renal disease) ([facility] [place]   Due to membranous GN dialysis 09/1996; peritoneal dialysis --? peitonitis; difficult vascular access-->HD MWF.  . Marland Kitchenangrene of left foot (HCDana   a. 11/2020 L forefoot dry gangrene involving 4th and 5th MTP joints & digits, & R 5th MTP joint.  . Marland KitchenERD  (gastroesophageal reflux disease)   . Hashimoto thyroiditis   . Hyperparathyroidism   . Hypertension   . Hypothyroidism   . Medically noncompliant   . Morbid obesity (HCBer[place]  . PAD (peripheral artery disease) (HCS[facility]   a. 11/2020 PTA of L peroneal, PTA/thrombectomy, Viabahn stenting x 2 to the L SFA and popliteal arteries (6x25068m 6x150m76m . Persistent atrial fibrillation (HCC)[place] a. Noted during hospitalization 11/2020.    Assessment: Pharmacy consulted to start heparin for afib. No DOAC PTA noted.   Goal of Therapy:  Heparin level 0.3-0.7 units/ml Monitor platelets by anticoagulation protocol: Yes   2/1 2351 HL 0.3, therapeutic 2/2 0837 HL <0.10, subtherapeutic  Plan:  Heparin 2200 unit bolus followed by increase in heparin drip to 1250 units/hr. Recheck HL at 1900. CBC daily while on heparin drip.  AbbyTawnya CrookarmD 12/28/2020,9:45 AM

## 2020-12-28 NOTE — Consult Note (Signed)
[place] for Heparin  Indication: atrial fibrillation  Allergies  Allergen Reactions  . Activase [Alteplase] Shortness Of Breath  . Bee Pollen Anaphylaxis  . Warfarin Sodium Nausea And Vomiting and Rash    Patient Measurements: Height: '5\' 3"'$  (160 cm) Weight: 110.8 kg (244 lb 4.3 oz) IBW/kg (Calculated) : 52.4 Heparin Dosing Weight: 76.7 kg  Vital Signs: Temp: 97.8 F (36.6 C) (02/02 1600) Temp Source: Axillary (02/02 1600) BP: 111/91 (02/02 1800) Pulse Rate: 67 (02/02 1800)  Labs: Recent Labs    12/26/20 0025 12/26/20 0600 12/27/20 2351 12/28/20 0837 12/28/20 1014 12/28/20 1846  HGB  --  8.0*  --   --  7.5*  --   HCT  --  25.5*  --   --  23.0*  --   PLT  --  167  --   --  182  --   HEPARINUNFRC  --   --  0.30 <0.10*  --  0.22*  CREATININE 8.07* 9.01*  --   --   --   --     Estimated Creatinine Clearance: 8.8 mL/min (A) (by C-G formula based on SCr of 9.01 mg/dL (H)).   Medical History: Past Medical History:  Diagnosis Date  . Anemia   . ASCVD (arteriosclerotic cardiovascular disease)    a. s/p prior stenting of LAD b. NSTEMI in 01/2018 requiring DES x3 to RCA 2/2 spiral dissection from ost->mid RCA but residual dzs in dRCA, LAD & OM2; c. 09/2019 Cath/PCI: LM nl, LAD 50 ISR, 65 p/m, 44m(2.5x15 Resolute Onyx DES), 90d, LCX 99d, OM2 99 (2.25x26 Resolute Onyx DES), RCA 40p/24mSR, 85d, RPDA 100ost, RPAV 50. EF 50-55%.  . Calciphylaxis   . Chronic abdominal wound infection   . COVID-19 virus infection 11/2020  . Dialysis patient (HCPal[place]  . Diastolic dysfunction    a. 11/2020 Echo: EF 55-60%, mod conc LVh, gr2 DD, nl RV size/fxn. Sev dil LA. Triv MR. Mod-Sev mitral annular Ca2+. Mild-mod Ao sclerosis w/o stenosis.  . Marland KitchenSRD (end stage renal disease) (HCM[facility] [place]   Due to membranous GN dialysis 09/1996; peritoneal dialysis --? peitonitis; difficult vascular access-->HD MWF.  . Marland Kitchenangrene of left foot (HCHarris[place]   a. 11/2020 L forefoot dry  gangrene involving 4th and 5th MTP joints & digits, & R 5th MTP joint.  . Marland KitchenERD (gastroesophageal reflux disease)   . Hashimoto thyroiditis   . Hyperparathyroidism   . Hypertension   . Hypothyroidism   . Medically noncompliant   . Morbid obesity (HCDet[place]  . PAD (peripheral artery disease) (HCP[place]   a. 11/2020 PTA of L peroneal, PTA/thrombectomy, Viabahn stenting x 2 to the L SFA and popliteal arteries (6x25037m 6x150m72m . Persistent atrial fibrillation (HCC)[place] a. Noted during hospitalization 11/2020.    Assessment: Pharmacy consulted to start heparin for afib. No DOAC PTA noted.   Goal of Therapy:  Heparin level 0.3-0.7 units/ml Monitor platelets by anticoagulation protocol: Yes   2/1 2351 HL 0.3, therapeutic 2/2 0837 HL <0.10, subtherapeutic --2200 unit bolus followed by increase in heparin drip to 1250 units/hr 2/2 1846 HL 0.22, subtherapeutic  Plan:  Heparin subtherapeutic. Will give Heparin 1150 unit bolus followed by increase in heparin drip to 1450 units/hr. Recheck HL in 8 hours. CBC daily while on heparin drip.  CariDorothe PeaarmD, BCPS Clinical Pharmacist  12/28/2020,7:23 PM

## 2020-12-29 DIAGNOSIS — I25118 Atherosclerotic heart disease of native coronary artery with other forms of angina pectoris: Secondary | ICD-10-CM

## 2020-12-29 LAB — CBC
HCT: 24.5 % — ABNORMAL LOW (ref 36.0–46.0)
Hemoglobin: 7.7 g/dL — ABNORMAL LOW (ref 12.0–15.0)
MCH: 31.6 pg (ref 26.0–34.0)
MCHC: 31.4 g/dL (ref 30.0–36.0)
MCV: 100.4 fL — ABNORMAL HIGH (ref 80.0–100.0)
Platelets: 245 10*3/uL (ref 150–400)
RBC: 2.44 MIL/uL — ABNORMAL LOW (ref 3.87–5.11)
RDW: 15.9 % — ABNORMAL HIGH (ref 11.5–15.5)
WBC: 14.1 10*3/uL — ABNORMAL HIGH (ref 4.0–10.5)
nRBC: 0 % (ref 0.0–0.2)

## 2020-12-29 LAB — BASIC METABOLIC PANEL
Anion gap: 15 (ref 5–15)
BUN: 54 mg/dL — ABNORMAL HIGH (ref 6–20)
CO2: 24 mmol/L (ref 22–32)
Calcium: 8.8 mg/dL — ABNORMAL LOW (ref 8.9–10.3)
Chloride: 97 mmol/L — ABNORMAL LOW (ref 98–111)
Creatinine, Ser: 9.34 mg/dL — ABNORMAL HIGH (ref 0.44–1.00)
GFR, Estimated: 5 mL/min — ABNORMAL LOW (ref >60)
Glucose, Bld: 92 mg/dL (ref 70–99)
Potassium: 5.2 mmol/L — ABNORMAL HIGH (ref 3.5–5.1)
Sodium: 136 mmol/L (ref 135–145)

## 2020-12-29 LAB — HEPARIN LEVEL (UNFRACTIONATED)
Heparin Unfractionated: 0.25 IU/mL — ABNORMAL LOW (ref 0.30–0.70)
Heparin Unfractionated: 0.52 IU/mL (ref 0.30–0.70)
Heparin Unfractionated: 0.29 IU/mL — ABNORMAL LOW (ref 0.30–0.70)

## 2020-12-29 MED ORDER — LIDOCAINE HCL (PF) 2 % IJ SOLN
INTRAMUSCULAR | Status: AC
  Filled 2020-12-29: qty 10

## 2020-12-29 MED ORDER — PROPOFOL 500 MG/50ML IV EMUL
INTRAVENOUS | Status: AC
  Filled 2020-12-29: qty 50

## 2020-12-29 MED ORDER — FENTANYL CITRATE (PF) 100 MCG/2ML IJ SOLN
INTRAMUSCULAR | Status: AC
  Filled 2020-12-29: qty 2

## 2020-12-29 MED ORDER — DEXMEDETOMIDINE (PRECEDEX) IN NS 20 MCG/5ML (4 MCG/ML) IV SYRINGE
PREFILLED_SYRINGE | INTRAVENOUS | Status: AC
  Filled 2020-12-29: qty 5

## 2020-12-29 MED ORDER — KETAMINE HCL 50 MG/5ML IJ SOSY
PREFILLED_SYRINGE | INTRAMUSCULAR | Status: AC
  Filled 2020-12-29: qty 5

## 2020-12-29 MED ORDER — HEPARIN BOLUS VIA INFUSION
1150.0000 [IU] | Freq: Once | INTRAVENOUS | Status: AC
  Administered 2020-12-29: 1150 [IU] via INTRAVENOUS
  Filled 2020-12-29: qty 1150

## 2020-12-29 NOTE — Anesthesia Preprocedure Evaluation (Addendum)
Anesthesia Evaluation  Patient identified by MRN, date of birth, ID band Patient awake    Reviewed: Allergy & Precautions, H&P , NPO status , reviewed documented beta blocker date and time   Airway Mallampati: III  TM Distance: >3 FB Neck ROM: full    Dental  (+) Poor Dentition, Dental Advidsory Given, Missing   Pulmonary shortness of breath, former smoker,    Pulmonary exam normal        Cardiovascular hypertension, + CAD, + Past MI and + Peripheral Vascular Disease   Rhythm:irregular  12/16/20 ECHO IMPRESSIONS    1. Left ventricular ejection fraction, by estimation, is 55 to 60%. The  left ventricle has normal function. Left ventricular endocardial border  not optimally defined to evaluate regional wall motion. There is moderate  left ventricular hypertrophy. Left  ventricular diastolic parameters are consistent with Grade II diastolic  dysfunction (pseudonormalization).  2. Right ventricular systolic function is normal. The right ventricular  size is normal. Tricuspid regurgitation signal is inadequate for assessing  PA pressure.  3. Left atrial size was severely dilated.  4. The mitral valve is abnormal. Trivial mitral valve regurgitation.  Moderate to severe mitral annular calcification.  5. The aortic valve is tricuspid. There is moderate calcification of the  aortic valve. Aortic valve regurgitation is not visualized. Mild to  moderate aortic valve sclerosis/calcification is present, without any  evidence of aortic stenosis. Aortic valve  mean gradient measures 8.3 mmHg. Aortic valve Vmax measures 2.02 m/s.  6. The inferior vena cava is normal in size with greater than 50%  respiratory variability, suggesting right atrial pressure of 3 mmHg.    Neuro/Psych    GI/Hepatic GERD  Controlled,  Endo/Other  Hypothyroidism Morbid obesity  Renal/GU Renal disease     Musculoskeletal   Abdominal   Peds   Hematology  (+) Blood dyscrasia, anemia ,   Anesthesia Other Findings Past Medical History: No date: Anemia No date: ASCVD (arteriosclerotic cardiovascular disease)     Comment:  a. s/p prior stenting of LAD b. NSTEMI in 01/2018               requiring DES x3 to RCA 2/2 spiral dissection from               ost->mid RCA but residual dzs in dRCA, LAD & OM2; c.               09/2019 Cath/PCI: LM nl, LAD 50 ISR, 65 p/m, 61m(2.5x15               Resolute Onyx DES), 90d, LCX 99d, OM2 99 (2.25x26               Resolute Onyx DES), RCA 40p/250mSR, 85d, RPDA 100ost,               RPAV 50. EF 50-55%. No date: Calciphylaxis No date: Chronic abdominal wound infection 11/2020: COVID-19 virus infection No date: [facility]: Diastolic dysfunction     Comment:  a. 11/2020 Echo: EF 55-60%, mod conc LVh, gr2 DD, nl RV               size/fxn. Sev dil LA. Triv MR. Mod-Sev mitral annular               Ca2+. Mild-mod Ao sclerosis w/o stenosis. No date: ESRD (end stage renal disease) (HCWater[place]    Comment:  Due to membranous GN dialysis 09/1996; peritoneal  dialysis --? peitonitis; difficult vascular access-->HD               MWF. No date: Gangrene of left foot ([place])     Comment:  a. 11/2020 L forefoot dry gangrene involving 4th and 5th               MTP joints & digits, & R 5th MTP joint. No date: GERD (gastroesophageal reflux disease) No date: Hashimoto thyroiditis No date: Hyperparathyroidism No date: Hypertension No date: Hypothyroidism No date: Medically noncompliant No date: Morbid obesity ([place]) No date: PAD (peripheral artery disease) ([place])     Comment:  a. 11/2020 PTA of L peroneal, PTA/thrombectomy, Viabahn               stenting x 2 to the L SFA and popliteal arteries (6x219m              & 6x1529m. No date: Persistent atrial fibrillation (HCMicro    Comment:  a. Noted during hospitalization 11/2020. Past Surgical History: 1992: BTL 10/21/2019: CORONARY STENT  INTERVENTION; N/A     Comment:  Procedure: CORONARY STENT INTERVENTION;  Surgeon: ArWellington HampshireMD;  [facility];  Service:               Cardiovascular;  Laterality: N/A; 02/11/2018: LEFT HEART CATH AND CORONARY ANGIOGRAPHY; N/A     Comment:  Procedure: LEFT HEART CATH AND CORONARY ANGIOGRAPHY;                Surgeon: HaCharolette ForwardMD;  [facility];  Service: Cardiovascular;  Laterality: N/A; 10/14/2019: LEFT HEART CATH AND CORONARY ANGIOGRAPHY; N/A     Comment:  Procedure: LEFT HEART CATH AND CORONARY ANGIOGRAPHY;                Surgeon: ArWellington HampshireMD;  Location: [facility]              [facility];  Service: Cardiovascular;  Laterality: N/A; 12/22/2020: LOWER EXTREMITY ANGIOGRAPHY; Left     Comment:  Procedure: Lower Extremity Angiography;  Surgeon: DeAlgernon HuxleyMD;  Location: [facility];  Service:               Cardiovascular;  Laterality: Left; No date: THYROIDECTOMY, PARTIAL     Comment:  Resectin of left lobe with reimplantation in the               forearm, small focus of papillary carcinoma incidentally               noted at pathology - 2000 and Hashimoto's thyrdoiditis in              2001 No date: TONSILLECTOMY AND ADENOIDECTOMY BMI    Body Mass Index: 43.27 kg/m     Reproductive/Obstetrics                           Anesthesia Physical Anesthesia Plan  ASA: IV  Anesthesia Plan: General   Post-op Pain Management:    Induction: Intravenous  PONV Risk Score and Plan: Ondansetron, Treatment may vary due to age or medical condition and TIVA  Airway Management Planned: Nasal Cannula and Natural  Airway  Additional Equipment:   Intra-op Plan:   Post-operative Plan: Extubation in OR  Informed Consent: I have reviewed the patients History and Physical, chart, labs and discussed the procedure including the risks, benefits and alternatives for the  proposed anesthesia with the patient or authorized representative who has indicated his/her understanding and acceptance.     Dental Advisory Given  Plan Discussed with: CRNA  Anesthesia Plan Comments: (LMA might be possible if HD are stable, prior LMA placement resulted in case canceled due to low BP)       Anesthesia Quick Evaluation

## 2020-12-29 NOTE — Progress Notes (Signed)
Progress Note  Patient Name: Lori [name] Date of Encounter: 12/29/2020  [facility] Cardiologist: Dr. Terrence Dupont  Subjective   No complaints, some foot discomfort Denies tachypalpitations, no significant shortness of breath Discussed with nursing, heart rate typically 90-100 Scheduled for procedure later today, with vascular, angiogram with possible intervention  Wound culture with Enterococcus for Cialis, Proteus vulgaris On broad-spectrum antibiotics  COVID-19 positive December 14, 2020, reports he is asymptomatic, received 3 days IV remdesivir Fully vaccinated including booster  Inpatient Medications    Scheduled Meds: . aspirin EC  81 mg Oral Daily  . atorvastatin  40 mg Oral Daily  . Chlorhexidine Gluconate Cloth  6 each Topical Daily  . epoetin (EPOGEN/PROCRIT) injection  8,000 Units Intravenous Q T,Th,Sa-HD  . gabapentin  100 mg Oral QHS  . levothyroxine  300 mcg Oral QHS  . metoprolol tartrate  12.5 mg Oral Q6H  . midodrine  10 mg Oral TID WC  . pantoprazole  40 mg Oral QHS  . sevelamer carbonate  3,200 mg Oral TID WC  . sodium chloride flush  10-40 mL Intracatheter Q12H   Continuous Infusions: . sodium chloride Stopped (12/26/20 0000)  . ampicillin-sulbactam (UNASYN) IV 3 g (12/29/20 0525)  . heparin 1,600 Units/hr (12/29/20 1031)   PRN Meds: acetaminophen, albuterol, HYDROcodone-acetaminophen, morphine injection, ondansetron **OR** ondansetron (ZOFRAN) IV, senna-docusate, sevelamer carbonate, sodium chloride flush   Vital Signs    Vitals:   12/29/20 0700 12/29/20 0800 12/29/20 0900 12/29/20 1101  BP: 101/89 (!) 113/99 (!) 115/102 (!) 132/106  Pulse:    (!) 107  Resp: '10 10 10   '$ Temp:  (!) 97.1 F (36.2 C)    TempSrc:  Axillary    SpO2:      Weight:      Height:        Intake/Output Summary (Last 24 hours) at 12/29/2020 1239 Last data filed at 12/29/2020 0200 Gross per 24 hour  Intake 120 ml  Output 0 ml  Net 120 ml   Last 3 Weights 12/23/2020  12/22/2020 12/21/2020  Weight (lbs) 244 lb 4.3 oz 242 lb 8.1 oz 240 lb 12.8 oz  Weight (kg) 110.8 kg 110 kg 109.226 kg      Telemetry    Atrial fibrillation rate 90-100- Personally Reviewed  ECG    - Personally Reviewed  Physical Exam   GEN: No acute distress.   Neck: No JVD Cardiac:  Irregularly irregular, no murmurs, rubs, or gallops.  Respiratory: Clear to auscultation bilaterally. GI: Soft, nontender, non-distended  MS: No edema; No deformity. Neuro:  Nonfocal  Psych: Normal affect   Labs    High Sensitivity Troponin:  No results for input(s): TROPONINIHS in the last 720 hours.    Chemistry Recent Labs  Lab 12/24/20 0820 12/25/20 0700 12/26/20 0025 12/26/20 0600 12/29/20 0545  NA 138   < > 135 134* 136  K 4.9   < > 4.7 5.1 5.2*  CL 97*   < > 98 97* 97*  CO2 25   < > '25 25 24  '$ GLUCOSE 87   < > 142* 107* 92  BUN 50*   < > 46* 50* 54*  CREATININE 9.75*   < > 8.07* 9.01* 9.34*  CALCIUM 8.7*   < > 7.8* 8.5* 8.8*  ALBUMIN 3.2*  --   --   --   --   GFRNONAA 4*   < > 6* 5* 5*  ANIONGAP 16*   < > '12 12 15   '$ < > =  values in this interval not displayed.     Hematology Recent Labs  Lab 12/26/20 0600 12/28/20 1014 12/29/20 0545  WBC 14.3* 15.3* 14.1*  RBC 2.54* 2.34* 2.44*  HGB 8.0* 7.5* 7.7*  HCT 25.5* 23.0* 24.5*  MCV 100.4* 98.3 100.4*  MCH 31.5 32.1 31.6  MCHC 31.4 32.6 31.4  RDW 16.2* 15.9* 15.9*  PLT 167 182 245    BNPNo results for input(s): BNP, PROBNP in the last 168 hours.   DDimer No results for input(s): DDIMER in the last 168 hours.   Radiology    No results found.  Cardiac Studies   Echo 1. Left ventricular ejection fraction, by estimation, is 55 to 60%. The  left ventricle has normal function. Left ventricular endocardial border  not optimally defined to evaluate regional wall motion. There is moderate  left ventricular hypertrophy. Left  ventricular diastolic parameters are consistent with Grade II diastolic  dysfunction  (pseudonormalization).  3. Left atrial size was severely dilated.   Patient Profile     50 y.o. female history of CAD, (prior PCI's to LAD, RCA, OM), PAD s/p recent left SFA proximal popliteal stenting (12/22/2020), ESRD on HD MWF, left foot infection being seen for A. fib with RVR  Assessment & Plan    1. Persistent A. Fib Long history of atrial fibrillation, Normal sinus rhythm last month any pen, back in atrial fibrillation around December 26, 2019, difficult to control rate Rate control limited by hypotension, inability to advance medications -On heparin, aspirin -On metoprolol tartrate 12.5 every 6, unable to advance secondary to hypotension -Blood pressure supported by midodrine 10 3 times daily Reports she is asymptomatic -Rate likely also partly driven by anemia, hemoglobin 7.5 -If blood pressure would allow we will proceed with TEE cardioversion, this is on hold  2. PAD, gangrene  recent left SFA and left proximal popliteal stenting Repeat procedure scheduled today Can hold heparin if needed  3. CAD, prior PCI Multiple prior PCI's most recently November 2020 Denies anginal symptoms Normal ejection fraction Continue Lipitor, aspirin , beta-blocker  4. Left foot infection -Antibiotics, management as per vascular as per primary team Undergoing angiogram, stenting  5. End-stage renal disease -Per nephrology  Complicated patient, long discussion with nursing total encounter time more than 35 minutes  Greater than 50% was spent in counseling and coordination of care with the patient    For questions or updates, please contact [place] Please consult [email or web address] for contact info under        Signed, Ida Rogue, MD  12/29/2020, 12:39 PM

## 2020-12-29 NOTE — Progress Notes (Signed)
Transfer out report given to [employer]. Patient still on dialysis.   Patient's procedure has been rescheduled for tom.   She remain on Heparin ifusing at 1600 units/hr= 16 ml/hr.

## 2020-12-29 NOTE — Progress Notes (Signed)
[facility] [place] NAME: Lori [name]    MR#:  [id number]  DATE OF BIRTH:  Feb 18, 1971  SUBJECTIVE:   Reports feeling ok. Has appetite. Left foot pain unchanged. No cough or sob. No chest pain, no n/v. Anxious to have procedure today.   REVIEW OF SYSTEMS:   Review of Systems  Constitutional: Positive for malaise/fatigue. Negative for chills, fever and weight loss.  HENT: Negative for ear discharge, ear pain and nosebleeds.   Eyes: Negative for blurred vision, pain and discharge.  Respiratory: Negative for sputum production, shortness of breath, wheezing and stridor.   Cardiovascular: Negative for chest pain, palpitations, orthopnea and PND.  Gastrointestinal: Negative for abdominal pain, diarrhea, nausea and vomiting.  Genitourinary: Negative for frequency and urgency.  Musculoskeletal: Negative for back pain and joint pain.  Neurological: Positive for weakness. Negative for sensory change, speech change and focal weakness.  Psychiatric/Behavioral: Negative for depression and hallucinations. The patient is not nervous/anxious.    Tolerating Diet:yes Tolerating PT:   DRUG ALLERGIES:   Allergies  Allergen Reactions  . Activase [Alteplase] Shortness Of Breath  . Bee Pollen Anaphylaxis  . Warfarin Sodium Nausea And Vomiting and Rash    VITALS:  Blood pressure (!) 115/102, pulse (!) 117, temperature (!) 97.1 F (36.2 C), temperature source Axillary, resp. rate 10, height '5\' 3"'$  (1.6 m), weight 110.8 kg, SpO2 100 %.  PHYSICAL EXAMINATION:   Physical Exam  GENERAL:  50 y.o.-year-old patient lying in the bed with no acute distress. Appears chronically ill LUNGS: Normal breath sounds bilaterally, no wheezing, rales, rhonchi. No use of accessory muscles of respiration.  CARDIOVASCULAR: S1, S2 normal. No murmurs, rubs, or gallops. Tachycardia/a fib ABDOMEN: Soft, nontender, nondistended. Bowel sounds present. EXTREMITIES:    NEUROLOGIC:  moves all extremities well. Decreased sensation in feet.   PSYCHIATRIC:  patient is alert and oriented x 3.  SKIN: as above  LABORATORY PANEL:  CBC Recent Labs  Lab 12/29/20 0545  WBC 14.1*  HGB 7.7*  HCT 24.5*  PLT 245    Chemistries  Recent Labs  Lab 12/26/20 0600 12/29/20 0545  NA 134* 136  K 5.1 5.2*  CL 97* 97*  CO2 25 24  GLUCOSE 107* 92  BUN 50* 54*  CREATININE 9.01* 9.34*  CALCIUM 8.5* 8.8*  MG 1.7  --    Cardiac Enzymes No results for input(s): TROPONINI in the last 168 hours. RADIOLOGY:  No results found. ASSESSMENT AND PLAN:  50 y.o.femalewith medical history significant forCAD s/p stent to LAD in 2020, ESRD on HD MWF, hypertension, hypothyroidism, hyperparathyroidism, GERD who was initially admitted to [facility] on 12/14/2020 for a left foot wound.  She was started on antibiotics.  General surgery evaluated the patient and opined that she may ultimately need a left AKA.  Patient wanted to discuss with her vascular surgeon Dr. Delana Meyer before any surgery would be considered.  She was transferred to [facility] for further evaluation   Rapid a fib with RVR/hypotension/questionable septic shock in the setting of ESRB and left foot infection Patient was transferred to ICU on 30 January. she received bolus of amiodarone, digoxin, IV Cardizem drip now off. Levophed discontinued, remains on midodrine. Cardiology following, per them cardioversion not safe. HR around 110 currently. - cont midodrine - lopressor q6 - avoiding digoxin given esrd - brillinta on hold given declining h/h - continue asa, heparin gtt  Left foot wound infection with gangrene/chronic ischemia of the left lower  extremity Dry necrotic/ischemic appearance of left fourth and fifth toes. transferredfrom [name] for vascular surgery evaluation. Status post aortogram and left lower extremity angiogram with angioplasty of left peroneal artery/left SFA and popliteal arteries along with  mechanical thrombectomy and stent placement of the left SFA and popliteal artery on 12/22/2020. Vascular and podiatry following.  - angiogram with possible intervention scheduled for today, will f/u with vascular surgery and possibly podiatry after that - Antibiotics: cefepime/flagyl 1/26-1/27, zosyn 1/27-2/1, unasyn 2/1>. Wound culture has grown Enterococcus faecalis and Proteus vulgaris. Prior provider discussed abx regimen w/ ID pharmacist. Plan for total 10 days tx. will rx for total 10 days (through 2/5)  End-stage renal disease on hemodialysis Hyperkalemia -Nephrology following.  Continue dialysis as per nephrology schedule t/t/s, tentative plan for dialysis today, should correct mild hyperkalemia of 5.2  Anemia of chronic disease H downtrending but today stable, 7.7 from 7.5 yesterday. No signs of overt bleeding. - brillinta on hold, asa and heparin gtt continuing - transfuse for h less than 7, if drops below that would also further w/u source of bleeding - epo w/ HD  COVID-19 positive -Incidentally Covid + 12/14/2020. She has been asymptomatic. She states she has been fully vaccinated including booster dose. She received 3 days IV remdesivir. Currently no need for further treatment.   CAD: s/p multiple stents -Continue aspirin, Coreg and statin --follows with Dr [facility])  Hypertension: with relative hypotension -Monitor blood pressure.  Continue Coreg  Hypothyroidism: -Continue Synthroid   DVT prophylaxis: Heparin Code Status: Full Family Communication: None Disposition Plan: tbd pending further w/u of ischemic toe Status is: Inpatient  Remains inpatient appropriate because:Inpatient level of care appropriate due to severity of illness   Dispo: The patient is from: Home  Anticipated d/c is to: tbd  Anticipated d/c date is: > 3 days  Patient currently is not medically stable to d/c.              Difficult to place  patient No  Consultants: Vascular surgery/nephrology/podiatry/ cardilogy  Procedures: aortogram and left lower extremity angiogram with angioplasty of left peroneal artery/left SFA and popliteal arteries along with mechanical thrombectomy and stent placement of the left SFA and popliteal artery on 12/22/2020    Level of care: Progressive Cardiac Status is: Inpatient    TOTAL TIME TAKING CARE OF THIS PATIENT: 79 min    Desma Maxim M.D    [facility]

## 2020-12-29 NOTE — Consult Note (Signed)
[place] for Heparin  Indication: atrial fibrillation  Allergies  Allergen Reactions  . Activase [Alteplase] Shortness Of Breath  . Bee Pollen Anaphylaxis  . Warfarin Sodium Nausea And Vomiting and Rash    Patient Measurements: Height: '5\' 3"'$  (160 cm) Weight: 110.8 kg (244 lb 4.3 oz) IBW/kg (Calculated) : 52.4 Heparin Dosing Weight: 76.7 kg  Vital Signs: Temp: 98.4 F (36.9 C) (02/02 2000) Temp Source: Oral (02/02 2000) BP: 124/95 (02/03 0430) Pulse Rate: 117 (02/03 0430)  Labs: Recent Labs    12/28/20 0837 12/28/20 1014 12/28/20 1846 12/29/20 0545  HGB  --  7.5*  --  7.7*  HCT  --  23.0*  --  24.5*  PLT  --  182  --  245  HEPARINUNFRC <0.10*  --  0.22* 0.25*  CREATININE  --   --   --  9.34*    Estimated Creatinine Clearance: 8.5 mL/min (A) (by C-G formula based on SCr of 9.34 mg/dL (H)).   Medical History: Past Medical History:  Diagnosis Date  . Anemia   . ASCVD (arteriosclerotic cardiovascular disease)    a. s/p prior stenting of LAD b. NSTEMI in 01/2018 requiring DES x3 to RCA 2/2 spiral dissection from ost->mid RCA but residual dzs in dRCA, LAD & OM2; c. 09/2019 Cath/PCI: LM nl, LAD 50 ISR, 65 p/m, 68m(2.5x15 Resolute Onyx DES), 90d, LCX 99d, OM2 99 (2.25x26 Resolute Onyx DES), RCA 40p/234mSR, 85d, RPDA 100ost, RPAV 50. EF 50-55%.  . Calciphylaxis   . Chronic abdominal wound infection   . COVID-19 virus infection 11/2020  . [facility] ([facility]  . Diastolic dysfunction    a. 11/2020 Echo: EF 55-60%, mod conc LVh, gr2 DD, nl RV size/fxn. Sev dil LA. Triv MR. Mod-Sev mitral annular Ca2+. Mild-mod Ao sclerosis w/o stenosis.  . 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 peitonitis; difficult vascular access-->HD MWF.  . Marland Kitchenangrene of left foot ([facility]   a. 11/2020 L forefoot dry gangrene involving 4th and 5th MTP joints & digits, & R 5th MTP joint.  . Marland KitchenERD  (gastroesophageal reflux disease)   . Hashimoto thyroiditis   . Hyperparathyroidism   . Hypertension   . Hypothyroidism   . Medically noncompliant   . Morbid obesity ([facility]  . PAD (peripheral artery disease) ([facility]   a. 11/2020 PTA of L peroneal, PTA/thrombectomy, Viabahn stenting x 2 to the L SFA and popliteal arteries (6x25055m 6x150m80m . Persistent atrial fibrillation (HCC)[place] a. Noted during hospitalization 11/2020.    Assessment: Pharmacy consulted to start heparin for afib. No DOAC PTA noted.   2/2 Hgb 7.5: brillinta held   Goal of Therapy:  Heparin level 0.3-0.7 units/ml Monitor platelets by anticoagulation protocol: Yes   2/1 2351 HL 0.3, therapeutic 2/2 0837 HL <0.10, subtherapeutic --2200 unit bolus followed by increase in heparin drip to 1250 units/hr 2/2 1846 HL 0.22, subtherapeutic 2/3 0545 HL 0.25, subtherapeutic   Plan:  2/3:  HL @ 0545 = 0.25 Will order heparin 1150 units IV X 1 bolus and increase drip rate to 1600 units/hr.  Will recheck 8 hrs after rate change.   RobbOrene DesanctisarmD Clinical Pharmacist  12/29/2020,6:37 AM

## 2020-12-29 NOTE — Consult Note (Signed)
[place] for Heparin  Indication: atrial fibrillation  Allergies  Allergen Reactions  . Activase [Alteplase] Shortness Of Breath  . Bee Pollen Anaphylaxis  . Warfarin Sodium Nausea And Vomiting and Rash    Patient Measurements: Height: '5\' 3"'$  (160 cm) Weight: 110.8 kg (244 lb 4.3 oz) IBW/kg (Calculated) : 52.4 Heparin Dosing Weight: 76.7 kg  Vital Signs: Temp: 97.5 F (36.4 C) (02/03 1200) Temp Source: Axillary (02/03 1200) BP: 115/90 (02/03 1300) Pulse Rate: 82 (02/03 1300)  Labs: Recent Labs    12/28/20 1014 12/28/20 1846 12/29/20 0545 12/29/20 1508  HGB 7.5*  --  7.7*  --   HCT 23.0*  --  24.5*  --   PLT 182  --  245  --   HEPARINUNFRC  --  0.22* 0.25* 0.52  CREATININE  --   --  9.34*  --     Estimated Creatinine Clearance: 8.5 mL/min (A) (by C-G formula based on SCr of 9.34 mg/dL (H)).   Medical History: Past Medical History:  Diagnosis Date  . Anemia   . ASCVD (arteriosclerotic cardiovascular disease)    a. s/p prior stenting of LAD b. NSTEMI in 01/2018 requiring DES x3 to RCA 2/2 spiral dissection from ost->mid RCA but residual dzs in dRCA, LAD & OM2; c. 09/2019 Cath/PCI: LM nl, LAD 50 ISR, 65 p/m, 74m(2.5x15 Resolute Onyx DES), 90d, LCX 99d, OM2 99 (2.25x26 Resolute Onyx DES), RCA 40p/252mSR, 85d, RPDA 100ost, RPAV 50. EF 50-55%.  . Calciphylaxis   . Chronic abdominal wound infection   . COVID-19 virus infection 11/2020  . Dialysis patient ([facility]  . Diastolic dysfunction    a. 11/2020 Echo: EF 55-60%, mod conc LVh, gr2 DD, nl RV size/fxn. Sev dil LA. Triv MR. Mod-Sev mitral annular Ca2+. Mild-mod Ao sclerosis w/o stenosis.  . 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 peitonitis; difficult vascular access-->HD MWF.  . Marland Kitchenangrene of left foot ([facility]   a. 11/2020 L forefoot dry gangrene involving 4th and 5th MTP joints & digits, & R 5th MTP joint.  . Marland KitchenERD  (gastroesophageal reflux disease)   . Hashimoto thyroiditis   . Hyperparathyroidism   . Hypertension   . Hypothyroidism   . Medically noncompliant   . Morbid obesity (HCD[facility]  . PAD (peripheral artery disease) ([facility]   a. 11/2020 PTA of L peroneal, PTA/thrombectomy, Viabahn stenting x 2 to the L SFA and popliteal arteries (6x25017m 6x150m31m . Persistent atrial fibrillation (HCC)[place] a. Noted during hospitalization 11/2020.    Assessment: Pharmacy consulted to start heparin for afib. No DOAC PTA noted.   2/2 Hgb 7.5: brillinta held   Goal of Therapy:  Heparin level 0.3-0.7 units/ml Monitor platelets by anticoagulation protocol: Yes   2/1 2351 HL 0.3, therapeutic 2/2 0837 HL <0.10, subtherapeutic --2200 unit bolus followed by increase in heparin drip to 1250 units/hr 2/2 1846 HL 0.22, subtherapeutic 2/3 0545 HL 0.25, subtherapeutic 2/3 1508 HL 0.52, therapeutic x 1   Plan:  Continue heparin drip at 1600 units/hr. Recheck HL at 2300 to confirm. CBC daily while on heparin drip.  AbbyTawnya CrookarmD Clinical Pharmacist  12/29/2020,3:44 PM

## 2020-12-29 NOTE — Progress Notes (Signed)
[facility]  ROUNDING NOTE   Subjective:    Denies any acute complaints Denies any acute shortness of breath- room air NPO for angiogram today- but got cancelled later HR in 110's-120's No chest pain Remains in AFib; requiring heparin infusion  Objective:  Vital signs in last 24 hours:  Temp:  [97.1 F (36.2 C)-98.4 F (36.9 C)] 97.1 F (36.2 C) (02/03 0800) Pulse Rate:  [29-155] 117 (02/03 0430) Resp:  [10-20] 10 (02/03 0900) BP: (84-124)/(63-105) 115/102 (02/03 0900) SpO2:  [78 %-100 %] 100 % (02/03 0430)  Weight change:  Filed Weights   12/21/20 0112 12/22/20 0427 12/23/20 0553  Weight: 109.2 kg (P) 110 kg 110.8 kg    Intake/Output: I/O last 3 completed shifts: In: 991.3 [P.O.:640; I.V.:151.3; IV Piggyback:200] Out: 0    Intake/Output this shift:  No intake/output data recorded.  Physical Exam: General: NAD, laying in bed  Head: Anicteric. Moist oral mucosal membranes  Lungs:  Clear breath sounds  Heart: Irregular tachycardic  Abdomen:  Soft, nontender, obese  Extremities:  both feet bandaged  Neurologic:  alert, oriented  Access: RIJ permcath       Basic Metabolic Panel: Recent Labs  Lab 12/24/20 0820 12/25/20 0700 12/26/20 0025 12/26/20 0600 12/29/20 0545  NA 138 135 135 134* 136  K 4.9 4.6 4.7 5.1 5.2*  CL 97* 98 98 97* 97*  CO2 '25 25 25 25 24  '$ GLUCOSE 87 86 142* 107* 92  BUN 50* 37* 46* 50* 54*  CREATININE 9.75* 7.45* 8.07* 9.01* 9.34*  CALCIUM 8.7* 8.4* 7.8* 8.5* 8.8*  MG  --   --  1.6* 1.7  --   PHOS 6.1*  --  4.5 4.9*  --     Liver Function Tests: Recent Labs  Lab 12/24/20 0820  ALBUMIN 3.2*   No results for input(s): LIPASE, AMYLASE in the last 168 hours. No results for input(s): AMMONIA in the last 168 hours.  CBC: Recent Labs  Lab 12/23/20 0641 12/24/20 0820 12/25/20 0700 12/26/20 0600 12/28/20 1014 12/29/20 0545  WBC 22.7* 14.2* 12.8* 14.3* 15.3* 14.1*  NEUTROABS 18.8*  --  9.1* 10.9*  --   --    HGB 9.7* 8.9* 8.4* 8.0* 7.5* 7.7*  HCT 31.5* 28.2* 27.1* 25.5* 23.0* 24.5*  MCV 101.6* 101.1* 101.5* 100.4* 98.3 100.4*  PLT 165 161 150 167 182 245    Cardiac Enzymes: No results for input(s): CKTOTAL, CKMB, CKMBINDEX, TROPONINI in the last 168 hours.  BNP: Invalid input(s): POCBNP  CBG: Recent Labs  Lab 12/25/20 2141 12/26/20 0749 12/26/20 1115 12/27/20 0726  GLUCAP 90 81 110* 91    Microbiology: Results for orders placed or performed during the hospital encounter of 12/14/20  Blood culture (routine x 2)     Status: None   Collection Time: 12/14/20  2:18 PM   Specimen: Right Antecubital; Blood  Result Value Ref Range Status   Specimen Description RIGHT ANTECUBITAL  Final   Special Requests   Final    BOTTLES DRAWN AEROBIC AND ANAEROBIC Blood Culture results may not be optimal due to an inadequate volume of blood received in culture bottles   Culture   Final    NO GROWTH 5 DAYS Performed at [facility], [address], [place], [place] [address]    Report Status 12/19/2020 FINAL  Final  Resp Panel by RT-PCR (Flu A&B, Covid) Nasopharyngeal Swab     Status: Abnormal   Collection Time: 12/14/20  7:21 PM   Specimen: Nasopharyngeal  Swab; Nasopharyngeal(NP) swabs in vial transport medium  Result Value Ref Range Status   SARS Coronavirus 2 by RT PCR POSITIVE (A) NEGATIVE Final    Comment: RESULT CALLED TO, READ BACK BY AND VERIFIED WITH: HARRY,A @ 2016 ON 12/14/20 BY JUW (NOTE) SARS-CoV-2 target nucleic acids are DETECTED.  The SARS-CoV-2 RNA is generally detectable in upper respiratory specimens during the acute phase of infection. Positive results are indicative of the presence of the identified virus, but do not rule out bacterial infection or co-infection with other pathogens not detected by the test. Clinical correlation with patient history and other diagnostic information is necessary to determine patient infection status. The expected result is Negative.  Fact  Sheet for Patients: EntrepreneurPulse.com.au  Fact Sheet for Healthcare Providers: IncredibleEmployment.be  This test is not yet approved or cleared by the [place] FDA and  has been authorized for detection and/or diagnosis of SARS-CoV-2 by FDA under an Emergency Use Authorization (EUA).  This EUA will remain in effect (meaning this test can be  used) for the duration of  the COVID-19 declaration under Section 564(b)(1) of the Act, 21 U.S.C. section 360bbb-3(b)(1), unless the authorization is terminated or revoked sooner.     Influenza A by PCR NEGATIVE NEGATIVE Final   Influenza B by PCR NEGATIVE NEGATIVE Final    Comment: (NOTE) The Xpert Xpress SARS-CoV-2/FLU/RSV plus assay is intended as an aid in the diagnosis of influenza from Nasopharyngeal swab specimens and should not be used as a sole basis for treatment. Nasal washings and aspirates are unacceptable for Xpert Xpress SARS-CoV-2/FLU/RSV testing.  Fact Sheet for Patients: EntrepreneurPulse.com.au  Fact Sheet for Healthcare Providers: IncredibleEmployment.be  This test is not yet approved or cleared by the [place] FDA and has been authorized for detection and/or diagnosis of SARS-CoV-2 by FDA under an Emergency Use Authorization (EUA). This EUA will remain in effect (meaning this test can be used) for the duration of the COVID-19 declaration under Section 564(b)(1) of the Act, 21 U.S.C. section 360bbb-3(b)(1), unless the authorization is terminated or revoked.  Performed at [facility], [address] [place], [place] [address]   Blood culture (routine x 2)     Status: None   Collection Time: 12/14/20  9:25 PM   Specimen: Neck; Blood  Result Value Ref Range Status   Specimen Description NECK  Final   Special Requests   Final    BOTTLES DRAWN AEROBIC AND ANAEROBIC Blood Culture adequate volume   Culture   Final    NO GROWTH 5  DAYS Performed at [facility], [address] [place], [place] [address]    Report Status 12/19/2020 FINAL  Final  Aerobic Culture (superficial specimen)     Status: None   Collection Time: 12/18/20  8:38 AM   Specimen: Wound  Result Value Ref Range Status   Specimen Description   Final    WOUND Performed at [facility], [address] [place], [place] [address]    Special Requests   Final    LEFT FOOT Performed at [facility], [address] [place], [place] [address]    Gram Stain   Final    RARE WBC PRESENT, PREDOMINANTLY MONONUCLEAR FEW GRAM POSITIVE COCCI RARE YEAST Performed at [facility], [place] [address] [place], [place] [address]    Culture   Final    MODERATE ENTEROCOCCUS FAECALIS MODERATE PROTEUS VULGARIS    Report Status 12/22/2020 FINAL  Final   Organism ID, Bacteria ENTEROCOCCUS FAECALIS  Final   Organism ID, Bacteria PROTEUS VULGARIS  Final      Susceptibility   Enterococcus faecalis - MIC*    AMPICILLIN <=2 SENSITIVE Sensitive     VANCOMYCIN 2 SENSITIVE Sensitive     GENTAMICIN SYNERGY SENSITIVE Sensitive     * MODERATE ENTEROCOCCUS FAECALIS   Proteus vulgaris - MIC*    AMPICILLIN >=32 RESISTANT Resistant     CEFAZOLIN >=64 RESISTANT Resistant     CEFEPIME <=0.12 SENSITIVE Sensitive     CEFTAZIDIME <=1 SENSITIVE Sensitive     CIPROFLOXACIN <=0.25 SENSITIVE Sensitive     GENTAMICIN <=1 SENSITIVE Sensitive     IMIPENEM 4 SENSITIVE Sensitive     TRIMETH/SULFA <=20 SENSITIVE Sensitive     AMPICILLIN/SULBACTAM 8 SENSITIVE Sensitive     PIP/TAZO <=4 SENSITIVE Sensitive     * MODERATE PROTEUS VULGARIS    Coagulation Studies: No results for input(s): LABPROT, INR in the last 72 hours.  Urinalysis: No results for input(s): COLORURINE, LABSPEC, PHURINE, GLUCOSEU, HGBUR, BILIRUBINUR, KETONESUR, PROTEINUR, UROBILINOGEN, NITRITE, LEUKOCYTESUR in the last 72 hours.  Invalid input(s): APPERANCEUR    Imaging: No results found.   Medications:    . sodium chloride Stopped (12/26/20 0000)  . ampicillin-sulbactam (UNASYN) IV 3 g (12/29/20 0525)  . heparin 1,600 Units/hr (12/29/20 [id number])   . aspirin EC  81 mg Oral Daily  . atorvastatin  40 mg Oral Daily  . Chlorhexidine Gluconate Cloth  6 each Topical Daily  . epoetin (EPOGEN/PROCRIT) injection  8,000 Units Intravenous Q T,Th,Sa-HD  . gabapentin  100 mg Oral QHS  . levothyroxine  300 mcg Oral QHS  . metoprolol tartrate  12.5 mg Oral Q6H  . midodrine  10 mg Oral TID WC  . pantoprazole  40 mg Oral QHS  . sevelamer carbonate  3,200 mg Oral TID WC  . sodium chloride flush  10-40 mL Intracatheter Q12H   acetaminophen, albuterol, HYDROcodone-acetaminophen, morphine injection, ondansetron **OR** ondansetron (ZOFRAN) IV, senna-docusate, sevelamer carbonate, sodium chloride flush  Assessment/ Plan:  [name] is a 50 y.o. white female with end stage renal disease on hemodialysis, coronary artery disease, hypertension, peripheral vascular disease, hypothyroidism, who was admitted to [facility] on 12/20/2020 for Cellulitis of fourth toe of left foot [L03.032]  Patient initially admitted to [facility]. Patient transferred to [facility] for vascular services. Patient with incidental COVID diagnosis. 12/14/2020  [facility] ([place]) MWF [facility] RIJ permcath 101.5kg  1. End Stage Renal Disease: Keep on TTS schedule due to COVID diagnosis. BP in 110's. HR in 110's HD planned for today depending on procedure schedule Skin abnormalities - check ANA w reflex  2.  Covid-19 infection Tested positive 1/19  3. Anemia of chronic kidney disease:   -  Lab Results  Component Value Date   HGB 7.7 (L) 12/29/2020   EPO with HD  4. Secondary Hyperparathyroidism:  - Sevelamer with meals  5. Peripheral vascular disease: with ischemic changes. Angiogram on 1/27 by Dr. Lucky Cowboy with balloon angioplasty, thrombectomy and two stents placed to SFA and left popliteal.  -  ticagrelor -podiatry team also following  6. A Fib with RVR Plan as per IM /cardiology team   LOS: 9 Ashvin Adelson 2/3/20229:45 AM

## 2020-12-30 ENCOUNTER — Inpatient Hospital Stay: Payer: Medicare Other | Admitting: Certified Registered Nurse Anesthetist

## 2020-12-30 ENCOUNTER — Encounter
Admission: AD | Disposition: A | Discharge status: Skilled Nursing Facility | Payer: Self-pay | Source: Other Acute Inpatient Hospital | Attending: Internal Medicine

## 2020-12-30 DIAGNOSIS — I70234 Atherosclerosis of native arteries of right leg with ulceration of heel and midfoot: Secondary | ICD-10-CM

## 2020-12-30 DIAGNOSIS — I70262 Atherosclerosis of native arteries of extremities with gangrene, left leg: Secondary | ICD-10-CM

## 2020-12-30 HISTORY — PX: LOWER EXTREMITY ANGIOGRAPHY: CATH118251

## 2020-12-30 LAB — CBC
HCT: 25.7 % — ABNORMAL LOW (ref 36.0–46.0)
HCT: 24.9 % — ABNORMAL LOW (ref 36.0–46.0)
Hemoglobin: 7.7 g/dL — ABNORMAL LOW (ref 12.0–15.0)
Hemoglobin: 8 g/dL — ABNORMAL LOW (ref 12.0–15.0)
MCH: 31.2 pg (ref 26.0–34.0)
MCH: 31.7 pg (ref 26.0–34.0)
MCHC: 30 g/dL (ref 30.0–36.0)
MCHC: 32.1 g/dL (ref 30.0–36.0)
MCV: 104 fL — ABNORMAL HIGH (ref 80.0–100.0)
MCV: 98.8 fL (ref 80.0–100.0)
Platelets: 229 10*3/uL (ref 150–400)
Platelets: 279 10*3/uL (ref 150–400)
RBC: 2.47 MIL/uL — ABNORMAL LOW (ref 3.87–5.11)
RBC: 2.52 MIL/uL — ABNORMAL LOW (ref 3.87–5.11)
RDW: 15.9 % — ABNORMAL HIGH (ref 11.5–15.5)
RDW: 15.9 % — ABNORMAL HIGH (ref 11.5–15.5)
WBC: 10.1 10*3/uL (ref 4.0–10.5)
WBC: 12.9 10*3/uL — ABNORMAL HIGH (ref 4.0–10.5)
nRBC: 0 % (ref 0.0–0.2)
nRBC: 0 % (ref 0.0–0.2)

## 2020-12-30 LAB — RENAL FUNCTION PANEL
Albumin: 2.9 g/dL — ABNORMAL LOW (ref 3.5–5.0)
Anion gap: 18 — ABNORMAL HIGH (ref 5–15)
BUN: 39 mg/dL — ABNORMAL HIGH (ref 6–20)
CO2: 23 mmol/L (ref 22–32)
Calcium: 9 mg/dL (ref 8.9–10.3)
Chloride: 97 mmol/L — ABNORMAL LOW (ref 98–111)
Creatinine, Ser: 7.16 mg/dL — ABNORMAL HIGH (ref 0.44–1.00)
GFR, Estimated: 6 mL/min — ABNORMAL LOW (ref >60)
Glucose, Bld: 94 mg/dL (ref 70–99)
Phosphorus: 4.5 mg/dL (ref 2.5–4.6)
Potassium: 4.5 mmol/L (ref 3.5–5.1)
Sodium: 138 mmol/L (ref 135–145)

## 2020-12-30 LAB — BASIC METABOLIC PANEL
Anion gap: 12 (ref 5–15)
BUN: 38 mg/dL — ABNORMAL HIGH (ref 6–20)
CO2: 24 mmol/L (ref 22–32)
Calcium: 8.8 mg/dL — ABNORMAL LOW (ref 8.9–10.3)
Chloride: 98 mmol/L (ref 98–111)
Creatinine, Ser: 6.83 mg/dL — ABNORMAL HIGH (ref 0.44–1.00)
GFR, Estimated: 7 mL/min — ABNORMAL LOW (ref >60)
Glucose, Bld: 92 mg/dL (ref 70–99)
Potassium: 4.2 mmol/L (ref 3.5–5.1)
Sodium: 134 mmol/L — ABNORMAL LOW (ref 135–145)

## 2020-12-30 LAB — ANA W/REFLEX IF POSITIVE: Anti Nuclear Antibody (ANA): NEGATIVE

## 2020-12-30 LAB — HEPARIN LEVEL (UNFRACTIONATED): Heparin Unfractionated: 0.3 IU/mL (ref 0.30–0.70)

## 2020-12-30 SURGERY — LOWER EXTREMITY ANGIOGRAPHY
Anesthesia: General | Laterality: Right

## 2020-12-30 MED ORDER — FENTANYL CITRATE (PF) 100 MCG/2ML IJ SOLN
INTRAMUSCULAR | Status: DC | PRN
  Administered 2020-12-30: 50 ug via INTRAVENOUS

## 2020-12-30 MED ORDER — DEXAMETHASONE SODIUM PHOSPHATE 10 MG/ML IJ SOLN
INTRAMUSCULAR | Status: AC
  Filled 2020-12-30: qty 1

## 2020-12-30 MED ORDER — HEPARIN SODIUM (PORCINE) 1000 UNIT/ML IJ SOLN
INTRAMUSCULAR | Status: DC | PRN
  Administered 2020-12-30: 3000 [IU] via INTRAVENOUS

## 2020-12-30 MED ORDER — HYDROMORPHONE HCL 1 MG/ML IJ SOLN
1.0000 mg | Freq: Once | INTRAMUSCULAR | Status: AC | PRN
  Administered 2020-12-30: 1 mg via INTRAVENOUS
  Filled 2020-12-30: qty 1

## 2020-12-30 MED ORDER — MIDAZOLAM HCL 2 MG/2ML IJ SOLN
INTRAMUSCULAR | Status: AC
  Filled 2020-12-30: qty 2

## 2020-12-30 MED ORDER — METOPROLOL TARTRATE 5 MG/5ML IV SOLN
INTRAVENOUS | Status: DC | PRN
  Administered 2020-12-30 (×2): 2 mg via INTRAVENOUS

## 2020-12-30 MED ORDER — DIPHENHYDRAMINE HCL 50 MG/ML IJ SOLN: 50.0000 mg | Freq: Once | INTRAMUSCULAR | Status: DC | PRN

## 2020-12-30 MED ORDER — METOPROLOL TARTRATE 5 MG/5ML IV SOLN
INTRAVENOUS | Status: AC
  Filled 2020-12-30: qty 5

## 2020-12-30 MED ORDER — PROPOFOL 10 MG/ML IV BOLUS
INTRAVENOUS | Status: AC
  Filled 2020-12-30: qty 20

## 2020-12-30 MED ORDER — LIDOCAINE HCL (CARDIAC) PF 100 MG/5ML IV SOSY
PREFILLED_SYRINGE | INTRAVENOUS | Status: DC | PRN
  Administered 2020-12-30: 100 mg via INTRAVENOUS

## 2020-12-30 MED ORDER — HEPARIN BOLUS VIA INFUSION
1150.0000 [IU] | Freq: Once | INTRAVENOUS | Status: AC
  Administered 2020-12-30: 1150 [IU] via INTRAVENOUS
  Filled 2020-12-30: qty 1150

## 2020-12-30 MED ORDER — FAMOTIDINE 20 MG PO TABS: 40.0000 mg | ORAL_TABLET | Freq: Once | ORAL | Status: DC | PRN

## 2020-12-30 MED ORDER — ROCURONIUM BROMIDE 100 MG/10ML IV SOLN
INTRAVENOUS | Status: DC | PRN
  Administered 2020-12-30: 10 mg via INTRAVENOUS
  Administered 2020-12-30: 50 mg via INTRAVENOUS
  Administered 2020-12-30 (×2): 20 mg via INTRAVENOUS

## 2020-12-30 MED ORDER — SUCCINYLCHOLINE CHLORIDE 20 MG/ML IJ SOLN
INTRAMUSCULAR | Status: DC | PRN
  Administered 2020-12-30: 120 mg via INTRAVENOUS

## 2020-12-30 MED ORDER — TIROFIBAN (AGGRASTAT) BOLUS VIA INFUSION
25.0000 ug/kg | Freq: Once | INTRAVENOUS | Status: AC
  Administered 2020-12-30: 2837.5 ug via INTRAVENOUS
  Filled 2020-12-30: qty 57

## 2020-12-30 MED ORDER — PHENYLEPHRINE HCL (PRESSORS) 10 MG/ML IV SOLN
INTRAVENOUS | Status: DC | PRN
  Administered 2020-12-30: 100 ug via INTRAVENOUS
  Administered 2020-12-30: 150 ug via INTRAVENOUS
  Administered 2020-12-30: 100 ug via INTRAVENOUS
  Administered 2020-12-30: 200 ug via INTRAVENOUS

## 2020-12-30 MED ORDER — SUGAMMADEX SODIUM 200 MG/2ML IV SOLN
INTRAVENOUS | Status: DC | PRN
  Administered 2020-12-30: 200 mg via INTRAVENOUS

## 2020-12-30 MED ORDER — CEFAZOLIN SODIUM-DEXTROSE 1-4 GM/50ML-% IV SOLN
1.0000 g | Freq: Once | INTRAVENOUS | Status: AC
  Administered 2020-12-30: 1 g via INTRAVENOUS

## 2020-12-30 MED ORDER — SODIUM CHLORIDE 0.9 % IV SOLN
INTRAVENOUS | Status: DC | PRN
  Administered 2020-12-30: 35 mL/h via INTRAVENOUS

## 2020-12-30 MED ORDER — NITROGLYCERIN 1 MG/10 ML FOR IR/CATH LAB
INTRA_ARTERIAL | Status: AC
  Filled 2020-12-30: qty 10

## 2020-12-30 MED ORDER — DEXAMETHASONE SODIUM PHOSPHATE 10 MG/ML IJ SOLN
INTRAMUSCULAR | Status: DC | PRN
  Administered 2020-12-30: 6 mg via INTRAVENOUS

## 2020-12-30 MED ORDER — VASOPRESSIN 20 UNIT/ML IV SOLN
INTRAVENOUS | Status: AC
  Filled 2020-12-30: qty 1

## 2020-12-30 MED ORDER — SENNOSIDES-DOCUSATE SODIUM 8.6-50 MG PO TABS
2.0000 | ORAL_TABLET | Freq: Every day | ORAL | Status: DC
  Administered 2020-12-30 – 2021-01-09 (×9): 2 via ORAL
  Filled 2020-12-30 (×10): qty 2

## 2020-12-30 MED ORDER — ONDANSETRON HCL 4 MG/2ML IJ SOLN: 4.0000 mg | Freq: Four times a day (QID) | INTRAMUSCULAR | Status: DC | PRN

## 2020-12-30 MED ORDER — TIROFIBAN HCL IN NACL 5-0.9 MG/100ML-% IV SOLN
0.0750 ug/kg/min | INTRAVENOUS | Status: AC
  Administered 2020-12-30 – 2020-12-31 (×3): 0.075 ug/kg/min via INTRAVENOUS
  Filled 2020-12-30 (×3): qty 100

## 2020-12-30 MED ORDER — SUGAMMADEX SODIUM 500 MG/5ML IV SOLN
INTRAVENOUS | Status: AC
  Filled 2020-12-30: qty 5

## 2020-12-30 MED ORDER — SUCCINYLCHOLINE CHLORIDE 20 MG/ML IJ SOLN: INTRAMUSCULAR | Status: DC | PRN

## 2020-12-30 MED ORDER — POLYETHYLENE GLYCOL 3350 17 G PO PACK
17.0000 g | PACK | Freq: Every day | ORAL | Status: DC
  Administered 2020-12-31 – 2021-01-08 (×6): 17 g via ORAL
  Filled 2020-12-30 (×8): qty 1

## 2020-12-30 MED ORDER — MIDAZOLAM HCL 2 MG/ML PO SYRP: 8.0000 mg | ORAL_SOLUTION | Freq: Once | ORAL | Status: DC | PRN

## 2020-12-30 MED ORDER — ONDANSETRON HCL 4 MG/2ML IJ SOLN
INTRAMUSCULAR | Status: AC
  Filled 2020-12-30: qty 2

## 2020-12-30 MED ORDER — ETOMIDATE 2 MG/ML IV SOLN
INTRAVENOUS | Status: DC | PRN
  Administered 2020-12-30: 18 mg via INTRAVENOUS

## 2020-12-30 MED ORDER — IODIXANOL 320 MG/ML IV SOLN
INTRAVENOUS | Status: DC | PRN
  Administered 2020-12-30: 100 mL

## 2020-12-30 MED ORDER — ACETAMINOPHEN 325 MG PO TABS
650.0000 mg | ORAL_TABLET | Freq: Three times a day (TID) | ORAL | Status: DC
  Administered 2020-12-30 – 2021-01-10 (×29): 650 mg via ORAL
  Filled 2020-12-30 (×29): qty 2

## 2020-12-30 MED ORDER — ETOMIDATE 2 MG/ML IV SOLN
INTRAVENOUS | Status: AC
  Filled 2020-12-30: qty 10

## 2020-12-30 MED ORDER — NITROGLYCERIN 1 MG/10 ML FOR IR/CATH LAB
INTRA_ARTERIAL | Status: DC | PRN
  Administered 2020-12-30: 250 ug

## 2020-12-30 MED ORDER — METHYLPREDNISOLONE SODIUM SUCC 125 MG IJ SOLR: 125.0000 mg | Freq: Once | INTRAMUSCULAR | Status: DC | PRN

## 2020-12-30 MED ORDER — ESMOLOL HCL 100 MG/10ML IV SOLN
INTRAVENOUS | Status: DC | PRN
  Administered 2020-12-30: 30 mg via INTRAVENOUS
  Administered 2020-12-30: 15 mg via INTRAVENOUS
  Administered 2020-12-30: 30 mg via INTRAVENOUS

## 2020-12-30 MED ORDER — FENTANYL CITRATE (PF) 100 MCG/2ML IJ SOLN
INTRAMUSCULAR | Status: AC
  Filled 2020-12-30: qty 2

## 2020-12-30 SURGICAL SUPPLY — 32 items
BALLN LUTONIX 018 5X150X130 (BALLOONS) ×2
BALLN LUTONIX 018 5X300X130 (BALLOONS) ×4
BALLN ULTRVRSE 10X40X75 (BALLOONS) ×2
BALLN ULTRVRSE 10X60X75 (BALLOONS) ×2
BALLN ULTRVRSE 3X300X150 (BALLOONS) ×2
BALLN ULTRVRSE 3X300X150 OTW (BALLOONS) ×1
BALLOON LUTONIX 018 5X150X130 (BALLOONS) ×1 IMPLANT
BALLOON LUTONIX 018 5X300X130 (BALLOONS) ×2 IMPLANT
BALLOON ULTRVRSE 10X40X75 (BALLOONS) ×1 IMPLANT
BALLOON ULTRVRSE 10X60X75 (BALLOONS) ×1 IMPLANT
BALLOON ULTRVRSE 3X300X150 OTW (BALLOONS) ×1 IMPLANT
CANNULA 5F STIFF (CANNULA) ×2 IMPLANT
CATH ANGIO 5F PIGTAIL 65CM (CATHETERS) ×2 IMPLANT
CATH TEMPO 5F RIM 65CM (CATHETERS) ×2 IMPLANT
CATH VERT 5FR 125CM (CATHETERS) ×2 IMPLANT
DEVICE SAFEGUARD 24CM (GAUZE/BANDAGES/DRESSINGS) ×4 IMPLANT
DEVICE STARCLOSE SE CLOSURE (Vascular Products) ×4 IMPLANT
GLIDEWIRE ADV .035X180CM (WIRE) ×2 IMPLANT
GLIDEWIRE ADV .035X260CM (WIRE) ×2 IMPLANT
INTRODUCER 7FR 23CM (INTRODUCER) ×4 IMPLANT
KIT ENCORE 26 ADVANTAGE (KITS) ×4 IMPLANT
NEEDLE ENTRY 21GA 7CM ECHOTIP (NEEDLE) ×2 IMPLANT
PACK ANGIOGRAPHY (CUSTOM PROCEDURE TRAY) ×2 IMPLANT
SHEATH ANL2 6FRX45 HC (SHEATH) ×2 IMPLANT
SHEATH BRITE TIP 5FRX11 (SHEATH) ×2 IMPLANT
STENT LIFESTREAM 9X38X80 (Permanent Stent) ×4 IMPLANT
STENT VIABAHN 6X150X120 (Permanent Stent) ×2 IMPLANT
STENT VIABAHN 6X250X120 (Permanent Stent) ×2 IMPLANT
TUBING CONTRAST HIGH PRESS 20 (MISCELLANEOUS) ×8 IMPLANT
WIRE G V18X300CM (WIRE) ×2 IMPLANT
WIRE GUIDERIGHT .035X150 (WIRE) ×2 IMPLANT
WIRE NITINOL .018 (WIRE) ×2 IMPLANT

## 2020-12-30 NOTE — Progress Notes (Signed)
Daily Progress Note   Subjective  - 6 Days Post-Op  Bilateral foot necrosis.  Patient is scheduled for angio today at noon.  Objective Vitals:   12/29/20 1800 12/29/20 2030 12/30/20 0359 12/30/20 0840  BP: 98/80 107/86 (!) 133/94 (!) 95/51  Pulse:  92 77 (!) 57  Resp: '16 18 16 18  '$ Temp:  97.7 F (36.5 C) 98.6 F (37 C) 99 F (37.2 C)  TempSrc:  Oral Oral Oral  SpO2:  98% 99% 99%  Weight:  113.8 kg 113.5 kg   Height:        Physical Exam: Necrotic superficial area to the lateral fifth MTPJ is noted.  No signs of infection.  Left fifth toe is completely gangrenous and necrotic and fourth toe is mostly necrotic and gangrenous.  No active purulent drainage.  Laboratory CBC    Component Value Date/Time   WBC 10.1 12/30/2020 0619   HGB 7.7 (L) 12/30/2020 0619   HGB 10.0 (L) 03/26/2014 1842   HCT 25.7 (L) 12/30/2020 0619   HCT 32.0 (L) 03/26/2014 1842   PLT 229 12/30/2020 0619   PLT 332 03/26/2014 1425    BMET    Component Value Date/Time   NA 134 (L) 12/30/2020 0619   NA 138 03/26/2014 1425   K 4.2 12/30/2020 0619   K 3.5 03/26/2014 1425   CL 98 12/30/2020 0619   CL 99 03/26/2014 1425   CO2 24 12/30/2020 0619   CO2 26 03/26/2014 1425   GLUCOSE 92 12/30/2020 0619   GLUCOSE 79 03/26/2014 1425   BUN 38 (H) 12/30/2020 0619   BUN 36 (H) 03/26/2014 1425   CREATININE 6.83 (H) 12/30/2020 0619   CREATININE 9.54 (H) 03/26/2014 1425   CALCIUM 8.8 (L) 12/30/2020 0619   CALCIUM 7.7 (L) 03/26/2014 1425   GFRNONAA 7 (L) 12/30/2020 0619   GFRNONAA 4 (L) 03/26/2014 1425   GFRAA 7 (L) 10/22/2019 0240   GFRAA 5 (L) 03/26/2014 1425    Assessment/Planning: Gangrene bilateral foot   Patient will still need amputation of at least the fourth fifth toes and partial ray amputations.  Going for angio today.  Once medically stabilized for surgery will proceed.  Will discuss with medicine as far as ability to undergo general anesthetic.   Samara Deist A  12/30/2020, 10:32 AM

## 2020-12-30 NOTE — Anesthesia Procedure Notes (Signed)
Procedure Name: Intubation Date/Time: 12/30/2020 12:39 PM Performed by: Allean Found, CRNA Pre-anesthesia Checklist: Patient identified, Patient being monitored, Timeout performed, Emergency Drugs available and Suction available Patient Re-evaluated:Patient Re-evaluated prior to induction Oxygen Delivery Method: Circle system utilized Preoxygenation: Pre-oxygenation with 100% oxygen Induction Type: IV induction Ventilation: Mask ventilation without difficulty Laryngoscope Size: Mac and 3 Grade View: Grade I Tube type: Oral Tube size: 7.0 mm Number of attempts: 1 Airway Equipment and Method: Stylet Placement Confirmation: ETT inserted through vocal cords under direct vision,  positive ETCO2 and breath sounds checked- equal and bilateral Secured at: 21 cm Tube secured with: Tape Dental Injury: Teeth and Oropharynx as per pre-operative assessment

## 2020-12-30 NOTE — Consult Note (Signed)
[place] for Heparin  Indication: atrial fibrillation  Allergies  Allergen Reactions  . Activase [Alteplase] Shortness Of Breath  . Bee Pollen Anaphylaxis  . Warfarin Sodium Nausea And Vomiting and Rash    Patient Measurements: Height: '5\' 3"'$  (160 cm) Weight: 113.5 kg (250 lb 3.6 oz) IBW/kg (Calculated) : 52.4 Heparin Dosing Weight: 76.7 kg  Vital Signs: Temp: 98.6 F (37 C) (02/04 1203) Temp Source: Oral (02/04 1203) BP: 114/84 (02/04 1203) Pulse Rate: 103 (02/04 1203)  Labs: Recent Labs    12/29/20 0545 12/29/20 1508 12/29/20 2237 12/30/20 0619 12/30/20 1148  HGB 7.7*  --   --  7.7* 8.0*  HCT 24.5*  --   --  25.7* 24.9*  PLT 245  --   --  229 279  HEPARINUNFRC 0.25* 0.52 0.29*  --  0.30  CREATININE 9.34*  --   --  6.83*  --     Estimated Creatinine Clearance: 11.8 mL/min (A) (by C-G formula based on SCr of 6.83 mg/dL (H)).   Medical History: Past Medical History:  Diagnosis Date  . Anemia   . ASCVD (arteriosclerotic cardiovascular disease)    a. s/p prior stenting of LAD b. NSTEMI in 01/2018 requiring DES x3 to RCA 2/2 spiral dissection from ost->mid RCA but residual dzs in dRCA, LAD & OM2; c. 09/2019 Cath/PCI: LM nl, LAD 50 ISR, 65 p/m, 44m(2.5x15 Resolute Onyx DES), 90d, LCX 99d, OM2 99 (2.25x26 Resolute Onyx DES), RCA 40p/26mSR, 85d, RPDA 100ost, RPAV 50. EF 50-55%.  . Calciphylaxis   . Chronic abdominal wound infection   . COVID-19 virus infection 11/2020  . Dialysis patient ([facility] [place]  . Diastolic dysfunction    a. 11/2020 Echo: EF 55-60%, mod conc LVh, gr2 DD, nl RV size/fxn. Sev dil LA. Triv MR. Mod-Sev mitral annular Ca2+. Mild-mod Ao sclerosis w/o stenosis.  . 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 peitonitis; difficult vascular access-->HD MWF.  . Marland Kitchenangrene of left foot (HCAgu[place]   a. 11/2020 L forefoot dry gangrene involving 4th and 5th MTP joints &  digits, & R 5th MTP joint.  . Marland KitchenERD (gastroesophageal reflux disease)   . Hashimoto thyroiditis   . Hyperparathyroidism   . Hypertension   . Hypothyroidism   . Medically noncompliant   . Morbid obesity ([place]  . PAD (peripheral artery disease) (HCPrince   a. 11/2020 PTA of L peroneal, PTA/thrombectomy, Viabahn stenting x 2 to the L SFA and popliteal arteries (6x25037m 6x150m20m . Persistent atrial fibrillation (HCC)Bright a. Noted during hospitalization 11/2020.    Assessment: Pharmacy consulted to start heparin for afib. No DOAC PTA noted.   2/02 - Hgb 7.5: brillinta held   Goal of Therapy:  Heparin level 0.3-0.7 units/ml Monitor platelets by anticoagulation protocol: Yes   Date Time HL  Rate/Comment 2/1 2351  0.3,   therapeutic 2/2 0837  <0.10,   subthera --2200 unit x1; f/b inc 1250 un/hr 2/2 1846  0.22,   subtherapeutic 2/3 0545 0.25,   subtherapeutic 2/3 1508  0.52,   therapeutic x 1; @ 1600 un/hr 2/3 2237  0.29  Subtherapeutic 1600un/hr 2/04 1148 0.30  LLN Therapeutic; '@1750'$  un/hr  Plan:  Patient at LLN goal range 0.3 (0.3-0.7). Will increase drip rate to 1850 units/hr. Will recheck HL 8 hrs after rate change.   BranLorna DibblearmD Clinical Pharmacist  12/30/2020,12:13  PM

## 2020-12-30 NOTE — OR Nursing (Signed)
Pt arrived directly to vascular lab for pre procedure assessment due to COVID isolation. Heparin infusion stapped. NS with ancef line hung, care turned over to aneshtesia. Pedal pulses dopplered, VS to obtained by aneshtesia (at their request) once pt transferred to table.

## 2020-12-30 NOTE — Progress Notes (Addendum)
[facility] [place] NAME: Nikeria Goldring    MR#:  [id number]  DATE OF BIRTH:  [birth year]  SUBJECTIVE:   Reports feeling ok. geneeralized mild abd pain, passing flatus but hasn't stooled for days. Left foot pain unchanged. No n/v/d. No chest pain. No sob.  REVIEW OF SYSTEMS:   Review of Systems  Constitutional: Positive for malaise/fatigue. Negative for chills, fever and weight loss.  HENT: Negative for ear discharge, ear pain and nosebleeds.   Eyes: Negative for blurred vision, pain and discharge.  Respiratory: Negative for sputum production, shortness of breath, wheezing and stridor.   Cardiovascular: Negative for chest pain, palpitations, orthopnea and PND.  Gastrointestinal: Negative for abdominal pain, diarrhea, nausea and vomiting.  Genitourinary: Negative for frequency and urgency.  Musculoskeletal: Negative for back pain and joint pain.  Neurological: Positive for weakness. Negative for sensory change, speech change and focal weakness.  Psychiatric/Behavioral: Negative for depression and hallucinations. The patient is not nervous/anxious.    Tolerating Diet:yes Tolerating PT:   DRUG ALLERGIES:   Allergies  Allergen Reactions  . Activase [Alteplase] Shortness Of Breath  . Bee Pollen Anaphylaxis  . Warfarin Sodium Nausea And Vomiting and Rash    VITALS:  Blood pressure (!) 95/51, pulse (!) 57, temperature 99 F (37.2 C), temperature source Oral, resp. rate 18, height '5\' 3"'$  (1.6 m), weight 113.5 kg, SpO2 99 %.  PHYSICAL EXAMINATION:   Physical Exam  GENERAL:  50 y.o.-year-old patient lying in the bed with no acute distress. Appears chronically ill LUNGS: Normal breath sounds bilaterally, no wheezing, rales, rhonchi. No use of accessory muscles of respiration.  CARDIOVASCULAR: S1, S2 normal. No murmurs, rubs, or gallops. Tachycardia/a fib ABDOMEN: Soft, nontender, nondistended. Bowel sounds present. EXTREMITIES:     NEUROLOGIC: moves all extremities well. Decreased sensation in feet.   PSYCHIATRIC:  patient is alert and oriented x 3.  SKIN: as above  LABORATORY PANEL:  CBC Recent Labs  Lab 12/30/20 0619  WBC 10.1  HGB 7.7*  HCT 25.7*  PLT 229    Chemistries  Recent Labs  Lab 12/26/20 0600 12/29/20 0545 12/30/20 0619  NA 134*   < > 134*  K 5.1   < > 4.2  CL 97*   < > 98  CO2 25   < > 24  GLUCOSE 107*   < > 92  BUN 50*   < > 38*  CREATININE 9.01*   < > 6.83*  CALCIUM 8.5*   < > 8.8*  MG 1.7  --   --    < > = values in this interval not displayed.   Cardiac Enzymes No results for input(s): TROPONINI in the last 168 hours. RADIOLOGY:  No results found. ASSESSMENT AND PLAN:  50 y.o.femalewith medical history significant forCAD s/p stent to LAD in 2020, ESRD on HD MWF, hypertension, hypothyroidism, hyperparathyroidism, GERD who was initially admitted to [facility] on 12/14/2020 for a left foot wound.  She was started on antibiotics.  General surgery evaluated the patient and opined that she may ultimately need a left AKA.  Patient wanted to discuss with her vascular surgeon Dr. Delana Meyer before any surgery would be considered.  She was transferred to [facility] for further evaluation   Rapid a fib with RVR/hypotension/questionable septic shock in the setting of ESRB and left foot infection Patient was transferred to ICU on 30 January. she received bolus of amiodarone, digoxin, IV Cardizem drip now off. Levophed discontinued, remains  on midodrine. Cardiology following, per them cardioversion not safe. HR wnl - cont midodrine - lopressor q6 - avoiding digoxin given esrd - brillinta on hold given declining h/h - continue asa, heparin gtt  Left foot wound infection with gangrene/chronic ischemia of the left lower extremity Dry necrotic/ischemic appearance of left fourth and fifth toes. transferredfrom [name] for vascular surgery evaluation. Status post aortogram and left  lower extremity angiogram with angioplasty of left peroneal artery/left SFA and popliteal arteries along with mechanical thrombectomy and stent placement of the left SFA and popliteal artery on 12/22/2020. Vascular and podiatry following. Angiogram planned for 2/3 but scheduling issues, re-scheduled for today - angiogram with possible intervention scheduled for today - podiatry tentatively planning for amputation on 2/6 - Antibiotics: cefepime/flagyl 1/26-1/27, zosyn 1/27-2/1, unasyn 2/1>. Wound culture has grown Enterococcus faecalis and Proteus vulgaris. Prior provider discussed abx regimen w/ ID pharmacist. Plan for total 10 days tx. will rx for total 10 days (through 2/5)  End-stage renal disease on hemodialysis Hyperkalemia -Nephrology following.  Continue dialysis as per nephrology schedule t/t/s  Anemia of chronic disease H downtrending but today stable, 7.7 from 7.7 yesterday. No signs of overt bleeding. - brillinta on hold, asa and heparin gtt continuing - transfuse for h less than 7, if drops below that would also further w/u source of bleeding - epo w/ HD - f/u b12/folate given macrocytosis  Bronze skin - f/u iron saturation   COVID-19 positive -Incidentally Covid + 12/14/2020. She has been asymptomatic. She states she has been fully vaccinated including booster dose. She received 3 days IV remdesivir. Currently no need for further treatment.   CAD: s/p multiple stents -Continue aspirin, Coreg and statin --follows with Dr Skipper Cliche [facility])  Hypertension: with relative hypotension -Monitor blood pressure.  Continue Coreg  Hypothyroidism: -Continue Synthroid  Constipation - will make senna/colace standing (has not been receiving prn) and add miralax qd   DVT prophylaxis: Heparin Code Status: Full Family Communication: None Disposition Plan: tbd pending further w/u of ischemic toe Status is: Inpatient  Remains inpatient appropriate because:Inpatient level of  care appropriate due to severity of illness   Dispo: The patient is from: Home  Anticipated d/c is to: tbd  Anticipated d/c date is: > 3 days  Patient currently is not medically stable to d/c.              Difficult to place patient No  Consultants: Vascular surgery/nephrology/podiatry/ cardilogy  Procedures: aortogram and left lower extremity angiogram with angioplasty of left peroneal artery/left SFA and popliteal arteries along with mechanical thrombectomy and stent placement of the left SFA and popliteal artery on 12/22/2020    Level of care: Progressive Cardiac Status is: Inpatient    TOTAL TIME TAKING CARE OF THIS PATIENT: 30 min    Desma Maxim M.D    [facility]

## 2020-12-30 NOTE — Progress Notes (Addendum)
[facility], [place] 12/30/20  Subjective:   Patient seen in bed Denies acute complaints Denies shortness of breath HR in 70's to 90's. Currently NPO for angiogram Heparin gtt continues   Objective:  Vital signs in last 24 hours:  Temp:  [97.6 F (36.4 C)-99 F (37.2 C)] 98.6 F (37 C) (02/04 1203) Pulse Rate:  [49-121] 103 (02/04 1203) Resp:  [11-18] 18 (02/04 1203) BP: (95-133)/(51-94) 114/84 (02/04 1203) SpO2:  [98 %-100 %] 99 % (02/04 0840) Weight:  [113.5 kg-113.8 kg] 113.5 kg (02/04 0359)  Weight change:  Filed Weights   12/23/20 0553 12/29/20 2030 12/30/20 0359  Weight: 110.8 kg 113.8 kg 113.5 kg    Intake/Output:    Intake/Output Summary (Last 24 hours) at 12/30/2020 1425 Last data filed at 12/30/2020 1410 Gross per 24 hour  Intake 385.65 ml  Output --  Net 385.65 ml     Physical Exam: General: Laying in bed, resting quietly  HEENT Moist oral mucosa  Pulm/lungs clear  CVS/Heart Atrial fib, rate-70's-90's  Abdomen:  soft  Extremities: No peripheral edema  Neurologic: Alert, able to answer questions  Skin: Generalized hypopigmented areas  Access: RIJ permcath       Basic Metabolic Panel:  Recent Labs  Lab 12/24/20 0820 12/25/20 0700 12/26/20 0025 12/26/20 0600 12/29/20 0545 12/30/20 0619 12/30/20 1148  NA 138   < > 135 134* 136 134* 138  K 4.9   < > 4.7 5.1 5.2* 4.2 4.5  CL 97*   < > 98 97* 97* 98 97*  CO2 25   < > '25 25 24 24 23  '$ GLUCOSE 87   < > 142* 107* 92 92 94  BUN 50*   < > 46* 50* 54* 38* 39*  CREATININE 9.75*   < > 8.07* 9.01* 9.34* 6.83* 7.16*  CALCIUM 8.7*   < > 7.8* 8.5* 8.8* 8.8* 9.0  MG  --   --  1.6* 1.7  --   --   --   PHOS 6.1*  --  4.5 4.9*  --   --  4.5   < > = values in this interval not displayed.     CBC: Recent Labs  Lab 12/25/20 0700 12/26/20 0600 12/28/20 1014 12/29/20 0545 12/30/20 0619 12/30/20 1148  WBC 12.8* 14.3* 15.3* 14.1* 10.1 12.9*  NEUTROABS 9.1* 10.9*  --   --    --   --   HGB 8.4* 8.0* 7.5* 7.7* 7.7* 8.0*  HCT 27.1* 25.5* 23.0* 24.5* 25.7* 24.9*  MCV 101.5* 100.4* 98.3 100.4* 104.0* 98.8  PLT 150 167 182 245 229 279      Lab Results  Component Value Date   HEPBSAG NON REACTIVE 12/25/2020      Microbiology:  No results found for this or any previous visit (from the past 240 hour(s)).  Coagulation Studies: No results for input(s): LABPROT, INR in the last 72 hours.  Urinalysis: No results for input(s): COLORURINE, LABSPEC, PHURINE, GLUCOSEU, HGBUR, BILIRUBINUR, KETONESUR, PROTEINUR, UROBILINOGEN, NITRITE, LEUKOCYTESUR in the last 72 hours.  Invalid input(s): APPERANCEUR    Imaging: No results found.   Medications:   . sodium chloride    . heparin 1,750 Units/hr (12/30/20 0452)   . acetaminophen  650 mg Oral Q8H  . [MAR Hold] aspirin EC  81 mg Oral Daily  . [MAR Hold] atorvastatin  40 mg Oral Daily  . [MAR Hold] Chlorhexidine Gluconate Cloth  6 each Topical Daily  . [MAR Hold] epoetin (  EPOGEN/PROCRIT) injection  8,000 Units Intravenous Q T,Th,Sa-HD  . [MAR Hold] gabapentin  100 mg Oral QHS  . [MAR Hold] levothyroxine  300 mcg Oral QHS  . [MAR Hold] metoprolol tartrate  12.5 mg Oral Q6H  . [MAR Hold] midodrine  10 mg Oral TID WC  . [MAR Hold] pantoprazole  40 mg Oral QHS  . [MAR Hold] polyethylene glycol  17 g Oral Daily  . [MAR Hold] senna-docusate  2 tablet Oral QHS  . [MAR Hold] sevelamer carbonate  3,200 mg Oral TID WC  . [MAR Hold] sodium chloride flush  10-40 mL Intracatheter Q12H   [MAR Hold] albuterol, diphenhydrAMINE, famotidine, [MAR Hold] HYDROcodone-acetaminophen, [MAR Hold]  HYDROmorphone (DILAUDID) injection, methylPREDNISolone (SOLU-MEDROL) injection, midazolam, [MAR Hold] ondansetron **OR** [MAR Hold] ondansetron (ZOFRAN) IV, [MAR Hold] ondansetron (ZOFRAN) IV, [MAR Hold] sevelamer carbonate, [MAR Hold] sodium chloride flush  Assessment/ Plan:  50 y.o. female with  end stage renal disease on hemodialysis,  coronary artery disease, hypertension, peripheral vascular disease, hypothyroidismwas admitted on 12/20/2020 for Cellulitis of fourth toe of left foot [L03.032]   Principal Problem:   Cellulitis of fourth toe of left foot Active Problems:   ESRD on dialysis [facility])   Hypothyroidism   Atrial fibrillation ([place])   Coronary artery disease involving native coronary artery of native heart without angina pectoris   PAD (peripheral artery disease) ([place])  [facility] ([place]) MWF [facility] RIJ permcath  #. ESRD: keep on TTS schedule due to Covid status -Successful treatment yesterday -Next treatment planned for tomorrow  -Closely monitoring BP and HR - ANA-negative for skin abnormalities  #Covid 19 infection -tested positive on 12/14/20 Management as per hospitalist team  #. Anemia of CKD  Lab Results  Component Value Date   HGB 8.0 (L) 12/30/2020   EPO 8,000 units with HD  #. Secondary hyperparathyroidism of renal origin N 25.81  -Sevelamir with meals    Component Value Date/Time   PTH 312.5 (H) 03/27/2014 0843   Lab Results  Component Value Date   PHOS 4.5 12/30/2020   Monitor calcium and phos level during this admission   #. Peripheral vascular disease: with ischemic changes -Scheduled for angiogram today -podiatry following.  #Afib with RVR -Continues Heparin drip -Plan as per IM/ Cardiology team   LOS: [place] 2/4/20222:25 PM  Patient was seen and evaluated with Colon Flattery, NP. She assisted with transcription of the note.  [address] [place], [place]

## 2020-12-30 NOTE — Progress Notes (Signed)
Pt transported to specials.

## 2020-12-30 NOTE — Progress Notes (Signed)
Progress Note  Patient Name: Lori [name] Date of Encounter: 12/30/2020  [facility] Cardiologist: Dr. Terrence Dupont  Subjective   Wound culture with Enterococcus for Cialis, Proteus vulgaris On broad-spectrum antibiotics  COVID-19 positive December 14, 2020,   received 3 days IV remdesivir Fully vaccinated including booster  Seen after lower extremity arterial angiogram PAD with gangrene of the left foot and ulceration of the right lower extremity Reports having back discomfort About to receive IV Dilaudid by covering nurse Telemetry reviewed, for the most part heart rate 90-100 for the past 24 hours, though past several hours heart rate trending upwards now 140 in the setting of pain  Inpatient Medications    Scheduled Meds: . acetaminophen  650 mg Oral Q8H  . aspirin EC  81 mg Oral Daily  . atorvastatin  40 mg Oral Daily  . Chlorhexidine Gluconate Cloth  6 each Topical Daily  . epoetin (EPOGEN/PROCRIT) injection  8,000 Units Intravenous Q T,Th,Sa-HD  . gabapentin  100 mg Oral QHS  . levothyroxine  300 mcg Oral QHS  . metoprolol tartrate  12.5 mg Oral Q6H  . midodrine  10 mg Oral TID WC  . pantoprazole  40 mg Oral QHS  . polyethylene glycol  17 g Oral Daily  . senna-docusate  2 tablet Oral QHS  . sevelamer carbonate  3,200 mg Oral TID WC  . sodium chloride flush  10-40 mL Intracatheter Q12H   Continuous Infusions: . sodium chloride Stopped (12/30/20 1523)  . heparin 1,850 Units/hr (12/30/20 1515)   PRN Meds: albuterol, HYDROcodone-acetaminophen, ondansetron **OR** ondansetron (ZOFRAN) IV, ondansetron (ZOFRAN) IV, sevelamer carbonate, sodium chloride flush   Vital Signs    Vitals:   12/30/20 0359 12/30/20 0840 12/30/20 1203 12/30/20 1507  BP: (!) 133/94 (!) 95/51 114/84 106/77  Pulse: 77 (!) 57 (!) 103 83  Resp: '16 18 18 20  '$ Temp: 98.6 F (37 C) 99 F (37.2 C) 98.6 F (37 C) 98 F (36.7 C)  TempSrc: Oral Oral Oral   SpO2: 99% 99%  99%  Weight: 113.5 kg      Height:        Intake/Output Summary (Last 24 hours) at 12/30/2020 1549 Last data filed at 12/30/2020 1410 Gross per 24 hour  Intake 385.65 ml  Output --  Net 385.65 ml   Last 3 Weights 12/30/2020 12/29/2020 12/23/2020  Weight (lbs) 250 lb 3.6 oz 250 lb 14.1 oz 244 lb 4.3 oz  Weight (kg) 113.5 kg 113.8 kg 110.8 kg      Telemetry    Atrial fibrillation rate 90-100- Personally Reviewed  ECG    - Personally Reviewed  Physical Exam   On examination : alert oriented, no JVD, lungs clear to auscultation bilaterally, heart sounds irregularly irregular no murmurs appreciated, abdomen soft nontender no significant lower extremity edema.  Musculoskeletal exam with good range of motion, neurologic exam grossly nonfocal   Labs    High Sensitivity Troponin:  No results for input(s): TROPONINIHS in the last 720 hours.    Chemistry Recent Labs  Lab 12/24/20 0820 12/25/20 0700 12/29/20 0545 12/30/20 0619 12/30/20 1148  NA 138   < > 136 134* 138  K 4.9   < > 5.2* 4.2 4.5  CL 97*   < > 97* 98 97*  CO2 25   < > '24 24 23  '$ GLUCOSE 87   < > 92 92 94  BUN 50*   < > 54* 38* 39*  CREATININE 9.75*   < >  9.34* 6.83* 7.16*  CALCIUM 8.7*   < > 8.8* 8.8* 9.0  ALBUMIN 3.2*  --   --   --  2.9*  GFRNONAA 4*   < > 5* 7* 6*  ANIONGAP 16*   < > 15 12 18*   < > = values in this interval not displayed.     Hematology Recent Labs  Lab 12/29/20 0545 12/30/20 0619 12/30/20 1148  WBC 14.1* 10.1 12.9*  RBC 2.44* 2.47* 2.52*  HGB 7.7* 7.7* 8.0*  HCT 24.5* 25.7* 24.9*  MCV 100.4* 104.0* 98.8  MCH 31.6 31.2 31.7  MCHC 31.4 30.0 32.1  RDW 15.9* 15.9* 15.9*  PLT 245 229 279    BNPNo results for input(s): BNP, PROBNP in the last 168 hours.   DDimer No results for input(s): DDIMER in the last 168 hours.   Radiology    PERIPHERAL VASCULAR CATHETERIZATION  Result Date: 12/30/2020 See op note   Cardiac Studies   Echo 1. Left ventricular ejection fraction, by estimation, is 55 to 60%. The   left ventricle has normal function. Left ventricular endocardial border  not optimally defined to evaluate regional wall motion. There is moderate  left ventricular hypertrophy. Left  ventricular diastolic parameters are consistent with Grade II diastolic  dysfunction (pseudonormalization).  3. Left atrial size was severely dilated.   Patient Profile     50 y.o. female history of CAD, (prior PCI's to LAD, RCA, OM), PAD s/p recent left SFA proximal popliteal stenting (12/22/2020), ESRD on HD MWF, left foot infection being seen for A. fib with RVR  Assessment & Plan    1. Persistent A. Fib Long history of atrial fibrillation, Normal sinus rhythm last month [name], back in atrial fibrillation around December 26, 2019, difficult to control rate Rate control limited by hypotension Tolerating  metoprolol tartrate 12.5 every 6 hours -Blood pressure supported by midodrine 10 3 times daily Right had been well controlled past 24 hours though higher in the setting of pain -Rate likely also partly driven by anemia, hemoglobin 8 -Could consider transesophageal echo with cardioversion next week if blood pressure tolerates -For hemodynamic instability or for persistently elevated rates greater than 130, may need to start amiodarone -Digoxin not a good option  2. PAD, gangrene  recent left SFA and left proximal popliteal stenting Has had repeated procedures, one today On antiplatelet therapy, heparin  3. CAD, prior PCI Multiple prior PCI's most recently November 2020 No anginal symptoms, normal ejection fraction Continue Lipitor, antiplatelets, beta-blocker as detailed above  4. Left foot infection -Antibiotics, management as per vascular as per primary team Undergoing angiogram, stenting.  Attempting to save foot  5. End-stage renal disease -Per nephrology   Total encounter time more than 25 minutes  Greater than 50% was spent in counseling and coordination of care with the  patient    For questions or updates, please contact [place] Please consult [email or web address] for contact info under        Signed, Ida Rogue, MD  12/30/2020, 3:49 PM

## 2020-12-30 NOTE — Op Note (Signed)
[facility]  Percutaneous Study/Intervention Procedural Note   Date of Surgery: 12/30/2020  Surgeon(s):Sharina Petre    Assistants:none  Pre-operative Diagnosis: PAD with gangrene of the left foot and ulceration of the right lower extremity  Post-operative diagnosis:  Same  Procedure(s) Performed:             1.  Ultrasound guidance for vascular access bilateral femoral arteries             2.  Catheter placement into into the right common femoral artery from the left femoral approach and into the aorta from the right femoral approach             3.  Aortogram and selective right lower extremity angiogram             4.  Percutaneous transluminal angioplasty of right anterior tibial artery with 3 mm diameter angioplasty balloon             5.   Percutaneous transluminal angioplasty of the right superficial femoral and popliteal artery with two 5 mm diameter Lutonix drug-coated angioplasty balloon  6.  Viabahn stent placement x2 to the right SFA and above-knee popliteal arteries with a 6 mm diameter by 25 cm length stent and 6 mm diameter by 15 cm length stent  7.  Kissing stent placements to bilateral common iliac arteries with 9 mm diameter by 38 mm length lifestream stents postdilated with 10 mm balloons             8.  StarClose closure device lateral femoral arteries  EBL: 25 cc  Contrast: 100 cc  Fluoro Time: 11.3 minutes  Anesthesia: general              Indications:  Patient is a 50 y.o.female with ulceration of the right foot and gangrene of the left foot. The patient has already undergone infrainguinal revascularization of the left lower extremity.  She has known significant common iliac artery disease and she is brought back to treat that as well as the infrainguinal disease on the right. The patient is brought in for angiography for further evaluation and potential treatment.  Due to the limb threatening nature of the situation, angiogram was performed  for attempted limb salvage. The patient is aware that if the procedure fails, amputation would be expected.  The patient also understands that even with successful revascularization, amputation may still be required due to the severity of the situation.  Risks and benefits are discussed and informed consent is obtained.   Procedure:  The patient was identified and appropriate procedural time out was performed.  The patient was then placed supine on the table and prepped and draped in the usual sterile fashion. Moderate conscious sedation was administered during a face to face encounter with the patient throughout the procedure with my supervision of the RN administering medicines and monitoring the patient's vital signs, pulse oximetry, telemetry and mental status throughout from the start of the procedure until the patient was taken to the recovery room. Ultrasound was used to evaluate the left common femoral artery.  It was patent .  A digital ultrasound image was acquired.  A Seldinger needle was used to access the left common femoral artery under direct ultrasound guidance and a permanent image was performed.  A 0.035 J wire was advanced without resistance and a 5Fr sheath was placed.  Pigtail catheter was placed into the aorta and an AP aortogram was performed. This demonstrated that the renal arteries  are really not seen likely due to her longstanding end-stage renal failure.  The aorta was calcified without stenosis.  The right common iliac artery had a high-grade stenosis in the 90% range.  The left common iliac artery had a little more stenosis than seen previously when we did magnified images it was in the 50% range but needed to be treated with a kissing stent concomitantly with the proximal right common iliac lesion.  The external iliac arteries and hypogastric arteries were fairly normal. I then crossed the aortic bifurcation and advanced to the right femoral head. Selective right lower extremity  angiogram was then performed. This demonstrated superficial femoral artery was diffusely diseased with multiple areas with greater than 80% stenosis.  The above-knee popliteal artery also had about an 80 to 90% stenosis.  The below-knee popliteal artery normalized.  The anterior tibial artery is the largest vessel distally, but it had 2 separate high-grade stenosis of greater than 80%.  1 of these was in the proximal segment and 1 of these was in the mid segment.  It was then continuous to the foot.  The peroneal artery was small but did provide a second runoff vessel.  The posterior tibial artery was chronically occluded. It was felt that it was in the patient's best interest to proceed with intervention after these images to avoid a second procedure and a larger amount of contrast and fluoroscopy based off of the findings from the initial angiogram. The patient was systemically heparinized and a 6 [place] Ansell sheath was then placed over the [employer] wire. I then used a Kumpe catheter and the advantage wire to cross the diffuse SFA and popliteal disease and then get down into the anterior tibial artery.  I then exchanged for a V 18 wire and crossed those lesions without difficulty.  A 3 mm diameter by 30 cm length angioplasty balloon was then inflated in the anterior tibial artery up to 10 atm for 1 minute.  Completion imaging showed less than 10% residual stenosis after angioplasty.  The SFA and popliteal lesions were then treated with 5 mm diameter by 30 and 5 mm diameter by 15 cm length Lutonix drug-coated angioplasty balloons inflated from 8 to 10 atm for 1 minute.  Completion imaging showed diffuse residual disease with multiple areas of greater than 50% stenosis and poor flow distally.  I then elected to cover these areas with stents.  A 6 mm diameter by 25 cm length Viabahn stent and a 6 mm diameter by 15 cm length Viabahn stent went from the above-knee popliteal artery up to the origin of the SFA.   These were then postdilated with 5 mm balloons with excellent angiographic ablation result and less than 10% residual stenosis.  There was significant spasm distally that was treated with intra-arterial nitroglycerin.  I then turned my attention to the iliac disease.  The right common femoral artery was then visualized under direct ultrasound guidance and accessed with a mild amount of difficulty with a micropuncture needle and a micropuncture wire and sheath were then placed.  Upsized to a 7 [place] sheath on the right.  A 7 French sheath was also placed on the left.  Parking advantage wire in the aorta from bilateral femoral approaches after crossing the lesions in the common iliac arteries, 9 mm diameter by 38 mm length lifestream stents were selected and deployed from the distal aorta down to the mid to distal common iliac arteries bilaterally.  These were then postdilated with  10 mm balloons with excellent angiographic completion result and less than 10% residual stenosis in both common iliac arteries. I elected to terminate the procedure. The sheath was removed and StarClose closure device was deployed in the left femoral artery with excellent hemostatic result. The patient was taken to the recovery room in stable condition having tolerated the procedure well.  Findings:               Aortogram:  Renal arteries are really not seen likely due to her longstanding end-stage renal failure.  The aorta was calcified without stenosis.  The right common iliac artery had a high-grade stenosis in the 90% range.  The left common iliac artery had a little more stenosis than seen previously when we did magnified images it was in the 50% range but needed to be treated with a kissing stent concomitantly with the proximal right common iliac lesion.  The external iliac arteries and hypogastric arteries were fairly normal.             Right lower Extremity:  The superficial femoral artery was diffusely diseased with  multiple areas with greater than 80% stenosis.  The above-knee popliteal artery also had about an 80 to 90% stenosis.  The below-knee popliteal artery normalized.  The anterior tibial artery is the largest vessel distally, but it had 2 separate high-grade stenosis of greater than 80%.  1 of these was in the proximal segment and 1 of these was in the mid segment.  It was then continuous to the foot.  The peroneal artery was small but did provide a second runoff vessel.  The posterior tibial artery was chronically occluded.   Disposition: Patient was taken to the recovery room in stable condition having tolerated the procedure well.  Complications: None  Leotis Pain 12/30/2020 2:16 PM   This note was created with Dragon Medical transcription system. Any errors in dictation are purely unintentional.

## 2020-12-30 NOTE — Consult Note (Signed)
[place] for Heparin  Indication: atrial fibrillation  Allergies  Allergen Reactions  . Activase [Alteplase] Shortness Of Breath  . Bee Pollen Anaphylaxis  . Warfarin Sodium Nausea And Vomiting and Rash    Patient Measurements: Height: '5\' 3"'$  (160 cm) Weight: 113.8 kg (250 lb 14.1 oz) IBW/kg (Calculated) : 52.4 Heparin Dosing Weight: 76.7 kg  Vital Signs: Temp: 97.7 F (36.5 C) (02/03 2030) Temp Source: Oral (02/03 2030) BP: 107/86 (02/03 2030) Pulse Rate: 92 (02/03 2030)  Labs: Recent Labs    12/28/20 1014 12/28/20 1846 12/29/20 0545 12/29/20 1508 12/29/20 2237  HGB 7.5*  --  7.7*  --   --   HCT 23.0*  --  24.5*  --   --   PLT 182  --  245  --   --   HEPARINUNFRC  --    < > 0.25* 0.52 0.29*  CREATININE  --   --  9.34*  --   --    < > = values in this interval not displayed.    Estimated Creatinine Clearance: 8.7 mL/min (A) (by C-G formula based on SCr of 9.34 mg/dL (H)).   Medical History: Past Medical History:  Diagnosis Date  . Anemia   . ASCVD (arteriosclerotic cardiovascular disease)    a. s/p prior stenting of LAD b. NSTEMI in 01/2018 requiring DES x3 to RCA 2/2 spiral dissection from ost->mid RCA but residual dzs in dRCA, LAD & OM2; c. 09/2019 Cath/PCI: LM nl, LAD 50 ISR, 65 p/m, 49m(2.5x15 Resolute Onyx DES), 90d, LCX 99d, OM2 99 (2.25x26 Resolute Onyx DES), RCA 40p/257mSR, 85d, RPDA 100ost, RPAV 50. EF 50-55%.  . Calciphylaxis   . Chronic abdominal wound infection   . COVID-19 virus infection 11/2020  . Dialysis patient (HCE[facility]  . Diastolic dysfunction    a. 11/2020 Echo: EF 55-60%, mod conc LVh, gr2 DD, nl RV size/fxn. Sev dil LA. Triv MR. Mod-Sev mitral annular Ca2+. Mild-mod Ao sclerosis w/o stenosis.  . 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 peitonitis; difficult vascular access-->HD MWF.  . Marland Kitchenangrene of left foot ([facility]   a. 11/2020 L forefoot  dry gangrene involving 4th and 5th MTP joints & digits, & R 5th MTP joint.  . Marland KitchenERD (gastroesophageal reflux disease)   . Hashimoto thyroiditis   . Hyperparathyroidism   . Hypertension   . Hypothyroidism   . Medically noncompliant   . Morbid obesity (HCA[facility]  . PAD (peripheral artery disease) ([facility]   a. 11/2020 PTA of L peroneal, PTA/thrombectomy, Viabahn stenting x 2 to the L SFA and popliteal arteries (6x25096m 6x150m92m . Persistent atrial fibrillation (HCC)[place] a. Noted during hospitalization 11/2020.    Assessment: Pharmacy consulted to start heparin for afib. No DOAC PTA noted.   2/2 Hgb 7.5: brillinta held   Goal of Therapy:  Heparin level 0.3-0.7 units/ml Monitor platelets by anticoagulation protocol: Yes   2/1 2351 HL 0.3, therapeutic 2/2 0837 HL <0.10, subtherapeutic --2200 unit bolus followed by increase in heparin drip to 1250 units/hr 2/2 1846 HL 0.22, subtherapeutic 2/3 0545 HL 0.25, subtherapeutic 2/3 1508 HL 0.52, therapeutic x 1  2/3 2237 HL 0.29, subtherapeutic  Plan:  Will order Heparin 1150 units IV X bolus and increase drip rate to 1750 units/hr. Will recheck HL 8 hrs after rate change.   RobbOrene DesanctisarmD Clinical Pharmacist  12/30/2020,12:43  AM

## 2020-12-31 DIAGNOSIS — I480 Paroxysmal atrial fibrillation: Secondary | ICD-10-CM

## 2020-12-31 LAB — IRON AND TIBC
Iron: 152 ug/dL (ref 28–170)
Saturation Ratios: 75 % — ABNORMAL HIGH (ref 10.4–31.8)
TIBC: 203 ug/dL — ABNORMAL LOW (ref 250–450)
UIBC: 51 ug/dL

## 2020-12-31 LAB — CBC
HCT: 24.6 % — ABNORMAL LOW (ref 36.0–46.0)
Hemoglobin: 7.6 g/dL — ABNORMAL LOW (ref 12.0–15.0)
MCH: 30.9 pg (ref 26.0–34.0)
MCHC: 30.9 g/dL (ref 30.0–36.0)
MCV: 100 fL (ref 80.0–100.0)
Platelets: 341 10*3/uL (ref 150–400)
RBC: 2.46 MIL/uL — ABNORMAL LOW (ref 3.87–5.11)
RDW: 15.9 % — ABNORMAL HIGH (ref 11.5–15.5)
WBC: 17.8 10*3/uL — ABNORMAL HIGH (ref 4.0–10.5)
nRBC: 0.1 % (ref 0.0–0.2)

## 2020-12-31 LAB — BASIC METABOLIC PANEL
Anion gap: 18 — ABNORMAL HIGH (ref 5–15)
BUN: 51 mg/dL — ABNORMAL HIGH (ref 6–20)
CO2: 22 mmol/L (ref 22–32)
Calcium: 9 mg/dL (ref 8.9–10.3)
Chloride: 96 mmol/L — ABNORMAL LOW (ref 98–111)
Creatinine, Ser: 8.37 mg/dL — ABNORMAL HIGH (ref 0.44–1.00)
GFR, Estimated: 5 mL/min — ABNORMAL LOW (ref >60)
Glucose, Bld: 98 mg/dL (ref 70–99)
Potassium: 5.1 mmol/L (ref 3.5–5.1)
Sodium: 136 mmol/L (ref 135–145)

## 2020-12-31 LAB — VITAMIN B12: Vitamin B-12: 357 pg/mL (ref 180–914)

## 2020-12-31 LAB — HEPARIN LEVEL (UNFRACTIONATED): Heparin Unfractionated: 0.24 IU/mL — ABNORMAL LOW (ref 0.30–0.70)

## 2020-12-31 LAB — FOLATE: Folate: 5.3 ng/mL — ABNORMAL LOW (ref >5.9)

## 2020-12-31 LAB — FERRITIN: Ferritin: 5383 ng/mL — ABNORMAL HIGH (ref 11–307)

## 2020-12-31 MED ORDER — HEPARIN (PORCINE) 25000 UT/250ML-% IV SOLN
1950.0000 [IU]/h | INTRAVENOUS | Status: AC
  Administered 2020-12-31: 1850 [IU]/h via INTRAVENOUS
  Filled 2020-12-31: qty 250

## 2020-12-31 MED ORDER — MORPHINE SULFATE (PF) 2 MG/ML IV SOLN
2.0000 mg | INTRAVENOUS | Status: DC | PRN
  Administered 2020-12-31 – 2021-01-03 (×5): 2 mg via INTRAVENOUS
  Filled 2020-12-31 (×5): qty 1

## 2020-12-31 NOTE — Progress Notes (Signed)
BFR Down to 350 due to high AP and blood lines are reversed RN aware

## 2020-12-31 NOTE — Progress Notes (Signed)
Covid precautions removed by Probation officer per MD order recommended by ID. Safe guards on both groin sites removed by Probation officer as well. Tolerated well. Guaze in place, no bleeding noted.

## 2020-12-31 NOTE — Progress Notes (Signed)
[facility], [place] 12/31/20  Subjective:   Patient resting in bed, in no acute distress.  She denies worsening shortness of breath, nausea or vomiting.  Objective:  Vital signs in last 24 hours:  Temp:  [97.6 F (36.4 C)-99.1 F (37.3 C)] 99.1 F (37.3 C) (02/05 1054) Pulse Rate:  [44-124] 99 (02/05 1230) Resp:  [16-22] 18 (02/05 1330) BP: (112-153)/(76-110) 133/102 (02/05 1330) SpO2:  [99 %-100 %] 100 % (02/05 1054) Weight:  [116.4 kg] 116.4 kg (02/05 0433)  Weight change: 2.6 kg Filed Weights   12/29/20 2030 12/30/20 0359 12/31/20 0433  Weight: 113.8 kg 113.5 kg 116.4 kg    Intake/Output:    Intake/Output Summary (Last 24 hours) at 12/31/2020 1541 Last data filed at 12/31/2020 1330 Gross per 24 hour  Intake 120 ml  Output 500 ml  Net -380 ml     Physical Exam: General:  In no acute distress  HEENT  normocephalic, atraumatic  Pulm/lungs  lungs clear to auscultation bilaterally  CVS/Heart  S1-S2, no rubs or gallops, irregular rhythm  Abdomen:  soft, nondistended  Extremities: No peripheral edema  Neurologic:  Able to answer simple questions appropriately  Skin:  No acute lesions or rashes  Access: RIJ permcath       Basic Metabolic Panel:  Recent Labs  Lab 12/26/20 0025 12/26/20 0600 12/29/20 0545 12/30/20 0619 12/30/20 1148 12/31/20 1050  NA 135 134* 136 134* 138 136  K 4.7 5.1 5.2* 4.2 4.5 5.1  CL 98 97* 97* 98 97* 96*  CO2 '25 25 24 24 23 22  '$ GLUCOSE 142* 107* 92 92 94 98  BUN 46* 50* 54* 38* 39* 51*  CREATININE 8.07* 9.01* 9.34* 6.83* 7.16* 8.37*  CALCIUM 7.8* 8.5* 8.8* 8.8* 9.0 9.0  MG 1.6* 1.7  --   --   --   --   PHOS 4.5 4.9*  --   --  4.5  --      CBC: Recent Labs  Lab 12/25/20 0700 12/26/20 0600 12/28/20 1014 12/29/20 0545 12/30/20 0619 12/30/20 1148 12/31/20 1050  WBC 12.8* 14.3* 15.3* 14.1* 10.1 12.9* 17.8*  NEUTROABS 9.1* 10.9*  --   --   --   --   --   HGB 8.4* 8.0* 7.5* 7.7* 7.7* 8.0* 7.6*   HCT 27.1* 25.5* 23.0* 24.5* 25.7* 24.9* 24.6*  MCV 101.5* 100.4* 98.3 100.4* 104.0* 98.8 100.0  PLT 150 167 182 245 229 279 341      Lab Results  Component Value Date   HEPBSAG NON REACTIVE 12/25/2020      Microbiology:  No results found for this or any previous visit (from the past 240 hour(s)).  Coagulation Studies: No results for input(s): LABPROT, INR in the last 72 hours.  Urinalysis: No results for input(s): COLORURINE, LABSPEC, PHURINE, GLUCOSEU, HGBUR, BILIRUBINUR, KETONESUR, PROTEINUR, UROBILINOGEN, NITRITE, LEUKOCYTESUR in the last 72 hours.  Invalid input(s): APPERANCEUR    Imaging: PERIPHERAL VASCULAR CATHETERIZATION  Result Date: 12/30/2020 See op note    Medications:   . sodium chloride Stopped (12/30/20 1523)  . heparin 1,850 Units/hr (12/31/20 1416)   . acetaminophen  650 mg Oral Q8H  . aspirin EC  81 mg Oral Daily  . atorvastatin  40 mg Oral Daily  . Chlorhexidine Gluconate Cloth  6 each Topical Daily  . epoetin (EPOGEN/PROCRIT) injection  8,000 Units Intravenous Q T,Th,Sa-HD  . gabapentin  100 mg Oral QHS  . levothyroxine  300 mcg Oral QHS  . metoprolol  tartrate  12.5 mg Oral Q6H  . midodrine  10 mg Oral TID WC  . pantoprazole  40 mg Oral QHS  . polyethylene glycol  17 g Oral Daily  . senna-docusate  2 tablet Oral QHS  . sevelamer carbonate  3,200 mg Oral TID WC  . sodium chloride flush  10-40 mL Intracatheter Q12H   albuterol, HYDROcodone-acetaminophen, ondansetron **OR** ondansetron (ZOFRAN) IV, ondansetron (ZOFRAN) IV, sevelamer carbonate, sodium chloride flush  Assessment/ Plan:  50 y.o. female with  end stage renal disease on hemodialysis, coronary artery disease, hypertension, peripheral vascular disease, hypothyroidismwas admitted on 12/20/2020 for Cellulitis of fourth toe of left foot [L03.032]   Principal Problem:   Cellulitis of fourth toe of left foot Active Problems:   ESRD on dialysis [facility])   Hypothyroidism   Atrial  fibrillation ([place])   Coronary artery disease involving native coronary artery of native heart without angina pectoris   PAD (peripheral artery disease) ([place])  [facility] () MWF [facility] RIJ permcath  #. ESRD: keep on TTS schedule due to Covid status Planning for dialysis later today We will continue TTS schedule  #Covid 19 infection -tested positive on 12/14/20 No acute respiratory distress noted  #. Anemia of CKD  Lab Results  Component Value Date   HGB 7.6 (L) 12/31/2020   Continue Epogen with dialysis treatments  #. Secondary hyperparathyroidism of renal origin N 25.81      Component Value Date/Time   PTH 312.5 (H) 03/27/2014 0843   Lab Results  Component Value Date   PHOS 4.5 12/30/2020   We will continue monitoring bone mineral metabolism parameters  #. Peripheral vascular disease: with ischemic changes Patient underwent vascular procedure yesterday for PAD Vascular and podiatric teams managing care  #Afib with RVR Rate controlled at this time On heparin infusion   LOS: [place] 2/5/20223:41 PM

## 2020-12-31 NOTE — Progress Notes (Signed)
[facility] [place] NAME: Lori [name]    MR#:  [id number]  DATE OF BIRTH:  February 24, 1971  SUBJECTIVE:   Seen in dialysis. Tolerated angiogram yesterday. Uneventful evening. No sob or chest pain or n/v.  REVIEW OF SYSTEMS:   Review of Systems  Constitutional: Positive for malaise/fatigue. Negative for chills, fever and weight loss.  HENT: Negative for ear discharge, ear pain and nosebleeds.   Eyes: Negative for blurred vision, pain and discharge.  Respiratory: Negative for sputum production, shortness of breath, wheezing and stridor.   Cardiovascular: Negative for chest pain, palpitations, orthopnea and PND.  Gastrointestinal: Negative for abdominal pain, diarrhea, nausea and vomiting.  Genitourinary: Negative for frequency and urgency.  Musculoskeletal: Negative for back pain and joint pain.  Neurological: Positive for weakness. Negative for sensory change, speech change and focal weakness.  Psychiatric/Behavioral: Negative for depression and hallucinations. The patient is not nervous/anxious.    Tolerating Diet:yes Tolerating PT:   DRUG ALLERGIES:   Allergies  Allergen Reactions  . Activase [Alteplase] Shortness Of Breath  . Bee Pollen Anaphylaxis  . Warfarin Sodium Nausea And Vomiting and Rash    VITALS:  Blood pressure 136/85, pulse 83, temperature 98.3 F (36.8 C), temperature source Axillary, resp. rate (!) 22, height '5\' 3"'$  (1.6 m), weight 116.4 kg, SpO2 100 %.  PHYSICAL EXAMINATION:   Physical Exam  GENERAL:  50 y.o.-year-old patient lying in the bed with no acute distress. Appears chronically ill LUNGS: Normal breath sounds bilaterally, no wheezing, rales, rhonchi. No use of accessory muscles of respiration.  CARDIOVASCULAR: S1, S2 normal. No murmurs, rubs, or gallops. Tachycardia/a fib ABDOMEN: Soft, nontender, nondistended. Bowel sounds present. EXTREMITIES:    NEUROLOGIC: moves all extremities well. Decreased  sensation in feet.   PSYCHIATRIC:  patient is alert and oriented x 3.  SKIN: as above. Pressure bandage left groin  LABORATORY PANEL:  CBC Recent Labs  Lab 12/30/20 1148  WBC 12.9*  HGB 8.0*  HCT 24.9*  PLT 279    Chemistries  Recent Labs  Lab 12/26/20 0600 12/29/20 0545 12/30/20 1148  NA 134*   < > 138  K 5.1   < > 4.5  CL 97*   < > 97*  CO2 25   < > 23  GLUCOSE 107*   < > 94  BUN 50*   < > 39*  CREATININE 9.01*   < > 7.16*  CALCIUM 8.5*   < > 9.0  MG 1.7  --   --    < > = values in this interval not displayed.   Cardiac Enzymes No results for input(s): TROPONINI in the last 168 hours. RADIOLOGY:  PERIPHERAL VASCULAR CATHETERIZATION  Result Date: 12/30/2020 See op note  ASSESSMENT AND PLAN:  50 y.o.femalewith medical history significant forCAD s/p stent to LAD in 2020, ESRD on HD MWF, hypertension, hypothyroidism, hyperparathyroidism, GERD who was initially admitted to [facility] on 12/14/2020 for a left foot wound.  She was started on antibiotics.  General surgery evaluated the patient and opined that she may ultimately need a left AKA.  Patient wanted to discuss with her vascular surgeon Dr. Delana Meyer before any surgery would be considered.  She was transferred to [facility] for further evaluation   Rapid a fib with RVR/hypotension/questionable septic shock in the setting of ESRB and left foot infection Patient was transferred to ICU on 30 January. she received bolus of amiodarone, digoxin, IV Cardizem drip now off. Levophed discontinued,  remains on midodrine. Cardiology following, per them cardioversion not safe. HR wnl today - cont midodrine - lopressor q6 - avoiding digoxin given esrd; amio a consideration - brillinta on hold given declining h/h - continue asa, heparin gtt - cardiology considering cardioversion next week  Left foot wound infection with gangrene/chronic ischemia of the left lower extremity Dry necrotic/ischemic appearance of left fourth and  fifth toes. transferredfrom [name] for vascular surgery evaluation. Status post aortogram and left lower extremity angiogram with angioplasty of left peroneal artery/left SFA and popliteal arteries along with mechanical thrombectomy and stent placement of the left SFA and popliteal artery on 12/22/2020. Vascular and podiatry following. Angiogram 2/4 with angioplasty and multiple stents placed. - podiatry planning for amputation on 2/6 - Antibiotics: cefepime/flagyl 1/26-1/27, zosyn 1/27-2/1, unasyn 2/1>. Wound culture has grown Enterococcus faecalis and Proteus vulgaris. Prior provider discussed abx regimen w/ ID pharmacist. Plan for total 10 days tx. Initial plan for 10 days abx through today, will continue through amputation tomorrow  End-stage renal disease on hemodialysis Hyperkalemia -Nephrology following.  Continue dialysis as per nephrology schedule t/t/s. Receiving dialysis now  Anemia of chronic disease H downtrending but stable, today's labs pending. - brillinta on hold, asa and heparin gtt continuing - transfuse for h less than 7, if drops below that would also further w/u source of bleeding - epo w/ HD - f/u b12/folate given macrocytosis  Bronze skin - f/u iron saturation   COVID-19 positive -Incidentally Covid + 12/14/2020. She has been asymptomatic. She states she has been fully vaccinated including booster dose. She received 3 days IV remdesivir. Currently no need for further treatment. Can d/c precautions.  CAD: s/p multiple stents -Continue aspirin, Coreg and statin --follows with Dr Skipper Cliche [facility])  Hypertension: with relative hypotension -Monitor blood pressure.  Continue Coreg  Hypothyroidism: -Continue Synthroid  Constipation - have made senna/colace standing (has not been receiving prn) and add miralax qd   DVT prophylaxis: Heparin Code Status: Full Family Communication: None Disposition Plan: tbd pending further w/u of ischemic toe Status  is: Inpatient  Remains inpatient appropriate because:Inpatient level of care appropriate due to severity of illness   Dispo: The patient is from: Home  Anticipated d/c is to: tbd  Anticipated d/c date is: > 3 days  Patient currently is not medically stable to d/c.              Difficult to place patient No  Consultants: Vascular surgery/nephrology/podiatry/ cardilogy  Procedures: aortogram and left lower extremity angiogram with angioplasty of left peroneal artery/left SFA and popliteal arteries along with mechanical thrombectomy and stent placement of the left SFA and popliteal artery on 12/22/2020. Repeat angiogram with angioplasty and stent placement 2/4.    Level of care: Med-Surg Status is: Inpatient    TOTAL TIME TAKING CARE OF THIS PATIENT: 31 min    Desma Maxim M.D    [facility]

## 2020-12-31 NOTE — Progress Notes (Signed)
Received patient awake and alert requesting to speak with the nursing supervisor about having her daughter and husband at bedside for surgical procedure in the morning. Patient was advised of the visiting policy and requested to speak with the nursing supervisor. CN made aware and AC made aware. Husband is at the bedside and brought patient chicken from [facility]. Patient educated on healthy food choices. Patient denied education.  Call light within reach. Bed in lowest position. Will continue to monitor and endorse.

## 2020-12-31 NOTE — Consult Note (Signed)
[place] for Heparin  Indication: atrial fibrillation  Allergies  Allergen Reactions  . Activase [Alteplase] Shortness Of Breath  . Bee Pollen Anaphylaxis  . Warfarin Sodium Nausea And Vomiting and Rash    Patient Measurements: Height: '5\' 3"'$  (160 cm) Weight: 116.4 kg (256 lb 9.9 oz) IBW/kg (Calculated) : 52.4 Heparin Dosing Weight: 76.7 kg  Vital Signs: Temp: 98.3 F (36.8 C) (02/05 0800) Temp Source: Axillary (02/05 0800) BP: 136/85 (02/05 0800) Pulse Rate: 83 (02/05 0800)  Labs: Recent Labs    12/29/20 0545 12/29/20 1508 12/29/20 2237 12/30/20 0619 12/30/20 1148  HGB 7.7*  --   --  7.7* 8.0*  HCT 24.5*  --   --  25.7* 24.9*  PLT 245  --   --  229 279  HEPARINUNFRC 0.25* 0.52 0.29*  --  0.30  CREATININE 9.34*  --   --  6.83* 7.16*    Estimated Creatinine Clearance: 11.4 mL/min (A) (by C-G formula based on SCr of 7.16 mg/dL (H)).   Medical History: Past Medical History:  Diagnosis Date  . Anemia   . ASCVD (arteriosclerotic cardiovascular disease)    a. s/p prior stenting of LAD b. NSTEMI in 01/2018 requiring DES x3 to RCA 2/2 spiral dissection from ost->mid RCA but residual dzs in dRCA, LAD & OM2; c. 09/2019 Cath/PCI: LM nl, LAD 50 ISR, 65 p/m, 38m(2.5x15 Resolute Onyx DES), 90d, LCX 99d, OM2 99 (2.25x26 Resolute Onyx DES), RCA 40p/279mSR, 85d, RPDA 100ost, RPAV 50. EF 50-55%.  . Calciphylaxis   . Chronic abdominal wound infection   . COVID-19 virus infection 11/2020  . [place] [place]  . Diastolic dysfunction    a. 11/2020 Echo: EF 55-60%, mod conc LVh, gr2 DD, nl RV size/fxn. Sev dil LA. Triv MR. Mod-Sev mitral annular Ca2+. Mild-mod Ao sclerosis w/o stenosis.  . Marland KitchenSRD (end stage renal disease) ([facility]   Due to membranous GN dialysis 09/1996; peritoneal dialysis --? peitonitis; difficult vascular access-->HD MWF.  . Marland Kitchenangrene of left foot ([facility]   a. 11/2020 L forefoot dry gangrene involving 4th and 5th MTP joints  & digits, & R 5th MTP joint.  . Marland KitchenERD (gastroesophageal reflux disease)   . Hashimoto thyroiditis   . Hyperparathyroidism   . Hypertension   . Hypothyroidism   . Medically noncompliant   . Morbid obesity (HCTye  . PAD (peripheral artery disease) ([facility]CL[place]   a. 11/2020 PTA of L peroneal, PTA/thrombectomy, Viabahn stenting x 2 to the L SFA and popliteal arteries (6x25055m 6x150m45m . Persistent atrial fibrillation (HCC)[place] a. Noted during hospitalization 11/2020.    Assessment: Pharmacy consulted to start heparin for afib. No DOAC PTA noted.   2/02 - Hgb 7.5: brillinta held   Goal of Therapy:  Heparin level 0.3-0.7 units/ml Monitor platelets by anticoagulation protocol: Yes   Date Time HL  Rate/Comment 2/1 2351  0.3,   therapeutic 2/2 0837  <0.10,   subthera --2200 unit x1; f/b inc 1250 un/hr 2/2 1846  0.22,   subtherapeutic 2/3 0545 0.25,   subtherapeutic 2/3 1508  0.52,   therapeutic x 1; @ 1600 un/hr 2/3 2237  0.29  Subtherapeutic 1600un/hr 2/04 1148 0.30  LLN Therapeutic; '@1750'$  un/hr  Plan:  Pt was started on tirofiban 2/4 and heparin was held. Restarting heparin after tirofiban is completed. Will restart heparin at 1850 units/hr. Check HL in 6 hours from infusion starting. CBC daily while on  heparin. Will hold heparin at midnight prior to the procedure.   Oswald Hillock, PharmD, BCPS Clinical Pharmacist  12/31/2020,11:12 AM

## 2020-12-31 NOTE — Consult Note (Signed)
[place] for Heparin  Indication: atrial fibrillation  Allergies  Allergen Reactions  . Activase [Alteplase] Shortness Of Breath  . Bee Pollen Anaphylaxis  . Warfarin Sodium Nausea And Vomiting and Rash    Patient Measurements: Height: '5\' 3"'$  (160 cm) Weight: 116.4 kg (256 lb 9.9 oz) IBW/kg (Calculated) : 52.4 Heparin Dosing Weight: 76.7 kg  Vital Signs: Temp: 97.6 F (36.4 C) (02/05 2104) Temp Source: Oral (02/05 2104) BP: 110/87 (02/05 2104) Pulse Rate: 95 (02/05 2104)  Labs: Recent Labs    12/29/20 2237 12/30/20 0619 12/30/20 1148 12/31/20 1050 12/31/20 2041  HGB  --  7.7* 8.0* 7.6*  --   HCT  --  25.7* 24.9* 24.6*  --   PLT  --  229 279 341  --   HEPARINUNFRC 0.29*  --  0.30  --  0.24*  CREATININE  --  6.83* 7.16* 8.37*  --     Estimated Creatinine Clearance: 9.8 mL/min (A) (by C-G formula based on SCr of 8.37 mg/dL (H)).   Medical History: Past Medical History:  Diagnosis Date  . Anemia   . ASCVD (arteriosclerotic cardiovascular disease)    a. s/p prior stenting of LAD b. NSTEMI in 01/2018 requiring DES x3 to RCA 2/2 spiral dissection from ost->mid RCA but residual dzs in dRCA, LAD & OM2; c. 09/2019 Cath/PCI: LM nl, LAD 50 ISR, 65 p/m, 27m(2.5x15 Resolute Onyx DES), 90d, LCX 99d, OM2 99 (2.25x26 Resolute Onyx DES), RCA 40p/280mSR, 85d, RPDA 100ost, RPAV 50. EF 50-55%.  . Calciphylaxis   . Chronic abdominal wound infection   . COVID-19 virus infection 11/2020  . Dialysis patient ([facility]  . Diastolic dysfunction    a. 11/2020 Echo: EF 55-60%, mod conc LVh, gr2 DD, nl RV size/fxn. Sev dil LA. Triv MR. Mod-Sev mitral annular Ca2+. Mild-mod Ao sclerosis w/o stenosis.  . Marland KitchenSRD (end stage renal disease) (HCTitus   Due to membranous GN dialysis 09/1996; peritoneal dialysis --? peitonitis; difficult vascular access-->HD MWF.  . Marland [place]angrene of left foot (HCL[place]   a. 11/2020 L forefoot dry gangrene involving 4th and 5th MTP joints &  digits, & R 5th MTP joint.  . Marland KitchenERD (gastroesophageal reflux disease)   . Hashimoto thyroiditis   . Hyperparathyroidism   . Hypertension   . Hypothyroidism   . Medically noncompliant   . Morbid obesity (HCLargo  . PAD (peripheral artery disease) (HCV[place]   a. 11/2020 PTA of L peroneal, PTA/thrombectomy, Viabahn stenting x 2 to the L SFA and popliteal arteries (6x25073m 6x150m15m . Persistent atrial fibrillation (HCC)[employer] a. Noted during hospitalization 11/2020.    Assessment: Pharmacy consulted to start heparin for afib. No DOAC PTA noted.   2/02 - Hgb 7.5: brillinta held   Goal of Therapy:  Heparin level 0.3-0.7 units/ml Monitor platelets by anticoagulation protocol: Yes   Date Time HL  Rate/Comment 2/1 2351  0.3,   therapeutic 2/2 0837  <0.10,   subthera --2200 unit x1; f/b inc 1250 un/hr 2/2 1846  0.22,   subtherapeutic 2/3 0545 0.25,   subtherapeutic 2/3 1508  0.52,   therapeutic x 1; @ 1600 un/hr 2/3 2237  0.29  Subtherapeutic 1600un/hr 2/4 1148 0.30  LLN Therapeutic; '@1750'$  un/hr 2/4 Pt was started on tirofiban. Heparin was held. Restarted heparin after tirofiban is completed 2/5. Will restart heparin at 1850  2/5 2041 0.24  Subtherapeutic   Plan:  2/5 @ 2041 HL  0.24 - Level is subtherapeutic. Will increase heparin infusion to 1950 units/hr.   Planning to hold heparin at midnight prior to the procedure.   CBC daily per protocol.   Pernell Dupre, PharmD, BCPS Clinical Pharmacist  12/31/2020,9:09 PM

## 2020-12-31 NOTE — Progress Notes (Signed)
Daily Progress Note   Subjective  - 1 Day Post-Op  Follow-up gangrenous changes to left fourth and fifth toes.  Patient seen in dialysis.  Has undergone revascularization to the lower extremities.  Objective Vitals:   12/31/20 1200 12/31/20 1215 12/31/20 1230 12/31/20 1245  BP: (!) 125/96 (!) 115/95 125/84 (!) 141/100  Pulse: (!) 55 91 99   Resp: '17 17 17 18  '$ Temp:      TempSrc:      SpO2:      Weight:      Height:        Physical Exam: Still necrotic tissue to the fourth and fifth toes at this time.  Dry necrosis to the superficial lateral aspect of the fifth MTPJ.  Laboratory CBC    Component Value Date/Time   WBC 17.8 (H) 12/31/2020 1050   HGB 7.6 (L) 12/31/2020 1050   HGB 10.0 (L) 03/26/2014 1842   HCT 24.6 (L) 12/31/2020 1050   HCT 32.0 (L) 03/26/2014 1842   PLT 341 12/31/2020 1050   PLT 332 03/26/2014 1425    BMET    Component Value Date/Time   NA 136 12/31/2020 1050   NA 138 03/26/2014 1425   K 5.1 12/31/2020 1050   K 3.5 03/26/2014 1425   CL 96 (L) 12/31/2020 1050   CL 99 03/26/2014 1425   CO2 22 12/31/2020 1050   CO2 26 03/26/2014 1425   GLUCOSE 98 12/31/2020 1050   GLUCOSE 79 03/26/2014 1425   BUN 51 (H) 12/31/2020 1050   BUN 36 (H) 03/26/2014 1425   CREATININE 8.37 (H) 12/31/2020 1050   CREATININE 9.54 (H) 03/26/2014 1425   CALCIUM 9.0 12/31/2020 1050   CALCIUM 7.7 (L) 03/26/2014 1425   GFRNONAA 5 (L) 12/31/2020 1050   GFRNONAA 4 (L) 03/26/2014 1425   GFRAA 7 (L) 10/22/2019 0240   GFRAA 5 (L) 03/26/2014 1425    Assessment/Planning: Gangrene left fourth and fifth toes   Plan for fourth fifth toe amputation and likely partial ray amputations of these areas for closure.  Discussed this case in great detail with the patient today.  I discussed the risk benefits and alternatives and complications associated with surgery and consent was verbally given.  We will plan to see tomorrow in the OR.  Have discussed with pharmacy holding heparin.  Can  restart after surgery has been performed.  Samara Deist A  12/31/2020, 1:01 PM

## 2020-12-31 NOTE — Progress Notes (Signed)
Patient transferred to 1A room 136. Reported to Durenda Guthrie LPN concerning pain, need for BM, and family present for surgical procedure.

## 2020-12-31 NOTE — Progress Notes (Signed)
Progress Note  Patient Name: Lori [name] Date of Encounter: 12/31/2020  [facility] Cardiologist: Dr. Terrence Dupont  Subjective   Seen in hemodialys this AM. No complaints other than intermittent left leg pain. Has back pain from being in the bed. She was unaware of the timing for surgery being potentially tomorrow.  Inpatient Medications    Scheduled Meds: . acetaminophen  650 mg Oral Q8H  . aspirin EC  81 mg Oral Daily  . atorvastatin  40 mg Oral Daily  . Chlorhexidine Gluconate Cloth  6 each Topical Daily  . epoetin (EPOGEN/PROCRIT) injection  8,000 Units Intravenous Q T,Th,Sa-HD  . gabapentin  100 mg Oral QHS  . levothyroxine  300 mcg Oral QHS  . metoprolol tartrate  12.5 mg Oral Q6H  . midodrine  10 mg Oral TID WC  . pantoprazole  40 mg Oral QHS  . polyethylene glycol  17 g Oral Daily  . senna-docusate  2 tablet Oral QHS  . sevelamer carbonate  3,200 mg Oral TID WC  . sodium chloride flush  10-40 mL Intracatheter Q12H   Continuous Infusions: . sodium chloride Stopped (12/30/20 1523)  . heparin    . tirofiban 0.075 mcg/kg/min (12/31/20 0810)   PRN Meds: albuterol, HYDROcodone-acetaminophen, ondansetron **OR** ondansetron (ZOFRAN) IV, ondansetron (ZOFRAN) IV, sevelamer carbonate, sodium chloride flush   Vital Signs    Vitals:   12/30/20 2000 12/30/20 2331 12/31/20 0433 12/31/20 0800  BP: 130/81 (!) 121/96 (!) 153/110 136/85  Pulse: (!) 102 73 66 83  Resp: 20 18 (!) 22   Temp: 97.7 F (36.5 C) 98 F (36.7 C) 97.6 F (36.4 C) 98.3 F (36.8 C)  TempSrc: Axillary Axillary Axillary Axillary  SpO2: 100% 99% 100%   Weight:   116.4 kg   Height:        Intake/Output Summary (Last 24 hours) at 12/31/2020 1125 Last data filed at 12/31/2020 0911 Gross per 24 hour  Intake 320 ml  Output 0 ml  Net 320 ml   Last 3 Weights 12/31/2020 12/30/2020 12/29/2020  Weight (lbs) 256 lb 9.9 oz 250 lb 3.6 oz 250 lb 14.1 oz  Weight (kg) 116.4 kg 113.5 kg 113.8 kg      Telemetry     Atrial fibrillation, rates 70s-100s Personally Reviewed  ECG    No new since 1/19 - Personally Reviewed  Physical Exam   GEN: Well nourished, well developed in no acute distress. [place] in place. R HD catheter NECK: No JVD appreciated but difficult body habitus CARDIAC: irregularly irregular rhythm, normal S1 and S2, no rubs or gallops. No murmur. VASCULAR: Radial pulses 1+ bilaterally.  RESPIRATORY:  Anteriorly clear ABDOMEN: Soft, non-tender, non-distended MUSCULOSKELETAL:  Moves all 4 limbs independently SKIN: firm dependent edema in bilateral legs (up to hips), equally firm in posterior thighs, mildly tender on the left posterior thigh.  NEUROLOGIC:  No focal neuro deficits noted. PSYCHIATRIC:  Normal affect    Labs    High Sensitivity Troponin:  No results for input(s): TROPONINIHS in the last 720 hours.    Chemistry Recent Labs  Lab 12/29/20 0545 12/30/20 0619 12/30/20 1148  NA 136 134* 138  K 5.2* 4.2 4.5  CL 97* 98 97*  CO2 '24 24 23  '$ GLUCOSE 92 92 94  BUN 54* 38* 39*  CREATININE 9.34* 6.83* 7.16*  CALCIUM 8.8* 8.8* 9.0  ALBUMIN  --   --  2.9*  GFRNONAA 5* 7* 6*  ANIONGAP 15 12 18*     Hematology  Recent Labs  Lab 12/29/20 0545 12/30/20 0619 12/30/20 1148  WBC 14.1* 10.1 12.9*  RBC 2.44* 2.47* 2.52*  HGB 7.7* 7.7* 8.0*  HCT 24.5* 25.7* 24.9*  MCV 100.4* 104.0* 98.8  MCH 31.6 31.2 31.7  MCHC 31.4 30.0 32.1  RDW 15.9* 15.9* 15.9*  PLT 245 229 279    BNPNo results for input(s): BNP, PROBNP in the last 168 hours.   DDimer No results for input(s): DDIMER in the last 168 hours.   Radiology    PERIPHERAL VASCULAR CATHETERIZATION  Result Date: 12/30/2020 See op note   Cardiac Studies   Echo 1. Left ventricular ejection fraction, by estimation, is 55 to 60%. The  left ventricle has normal function. Left ventricular endocardial border  not optimally defined to evaluate regional wall motion. There is moderate  left ventricular hypertrophy. Left   ventricular diastolic parameters are consistent with Grade II diastolic  dysfunction (pseudonormalization).  3. Left atrial size was severely dilated.   Patient Profile     50 y.o. female history of CAD, (prior PCI's to LAD, RCA, OM), PAD s/p recent left SFA proximal popliteal stenting (12/22/2020), ESRD on HD MWF, left foot infection being seen in consultation for A. fib with RVR  Assessment & Plan    Paroxysmal atrial fibrillation -in afib since evening of 12/25/20, was not therapeutically anticoagulated until ~48 hours later -hypotension limits rate control -not ideal to use amiodarone given potential for chemical cardioversion, but if she becomes unstable this may be only option -tolerating metoprolol tartrate 12.5 mg every 6 hours, though multiple doses (including two most recent) have been held. Recent BP has been improved, would make sure she is not missing doses of metoprolol.  -anemia, pain likely exacerbating heart rate as well. -could consider TEE with cardioversion next week post procedures (so that anticoagulation can be uninterrupted) -no digoxin given ESRD -would restart heparin on completion of tirofaban, monitor closely for bleeding  PAD, gangrene, bilateral foot necrosis -S/P PTCA left peroneal, thrombectomy L SFA and L popliteal followed by PTCA 12/22/20 -S/P PTCA R anterior tibial, R SFA, R popliteal, and bilateral common iliacs 12/30/20 -planned for OR tomorrow for amputation -DAPT per vascular, was on aspirin/ticagrelor as an outpatient  CAD, prior PCI -EF preserved, denies symptoms -continue antiplatelet, currently on aspirin and tirofaban -continue atorvastatin 40 mg daily -beta blocker as above  End-stage renal disease -Per nephrology  Hypotension -on midodrine chronically for BP support -limits options for management of atrial fibrillation  For questions or updates, please contact [facility] Please consult [email or web address] for contact info under      Signed, Buford Dresser, MD  12/31/2020, 11:25 AM

## 2021-01-01 ENCOUNTER — Inpatient Hospital Stay: Payer: Medicare Other

## 2021-01-01 ENCOUNTER — Inpatient Hospital Stay: Payer: Medicare Other | Admitting: Anesthesiology

## 2021-01-01 ENCOUNTER — Encounter
Admission: AD | Disposition: A | Discharge status: Skilled Nursing Facility | Payer: Self-pay | Source: Other Acute Inpatient Hospital | Attending: Internal Medicine

## 2021-01-01 HISTORY — PX: AMPUTATION: SHX166

## 2021-01-01 LAB — BASIC METABOLIC PANEL
Anion gap: 14 (ref 5–15)
BUN: 39 mg/dL — ABNORMAL HIGH (ref 6–20)
CO2: 23 mmol/L (ref 22–32)
Calcium: 8.3 mg/dL — ABNORMAL LOW (ref 8.9–10.3)
Chloride: 101 mmol/L (ref 98–111)
Creatinine, Ser: 6.54 mg/dL — ABNORMAL HIGH (ref 0.44–1.00)
GFR, Estimated: 7 mL/min — ABNORMAL LOW (ref >60)
Glucose, Bld: 87 mg/dL (ref 70–99)
Potassium: 4.2 mmol/L (ref 3.5–5.1)
Sodium: 138 mmol/L (ref 135–145)

## 2021-01-01 LAB — CBC
HCT: 22.1 % — ABNORMAL LOW (ref 36.0–46.0)
Hemoglobin: 6.7 g/dL — ABNORMAL LOW (ref 12.0–15.0)
MCH: 31.3 pg (ref 26.0–34.0)
MCHC: 30.3 g/dL (ref 30.0–36.0)
MCV: 103.3 fL — ABNORMAL HIGH (ref 80.0–100.0)
Platelets: 289 10*3/uL (ref 150–400)
RBC: 2.14 MIL/uL — ABNORMAL LOW (ref 3.87–5.11)
RDW: 16.2 % — ABNORMAL HIGH (ref 11.5–15.5)
WBC: 17.7 10*3/uL — ABNORMAL HIGH (ref 4.0–10.5)
nRBC: 0.3 % — ABNORMAL HIGH (ref 0.0–0.2)

## 2021-01-01 SURGERY — AMPUTATION, FOOT, RAY
Anesthesia: General | Site: Foot | Laterality: Left

## 2021-01-01 MED ORDER — LIDOCAINE HCL (PF) 2 % IJ SOLN
INTRAMUSCULAR | Status: AC
  Filled 2021-01-01: qty 20

## 2021-01-01 MED ORDER — SODIUM CHLORIDE 0.9% IV SOLUTION: Freq: Once | INTRAVENOUS | Status: AC

## 2021-01-01 MED ORDER — METOPROLOL TARTRATE 25 MG PO TABS
12.5000 mg | ORAL_TABLET | Freq: Four times a day (QID) | ORAL | Status: DC
  Administered 2021-01-02 – 2021-01-03 (×5): 12.5 mg via ORAL
  Filled 2021-01-01 (×5): qty 1

## 2021-01-01 MED ORDER — OXYCODONE HCL 5 MG PO TABS: 5.0000 mg | ORAL_TABLET | Freq: Once | ORAL | Status: DC | PRN

## 2021-01-01 MED ORDER — LIDOCAINE HCL (PF) 2 % IJ SOLN
INTRAMUSCULAR | Status: DC | PRN
  Administered 2021-01-01: 140 mg via PERINEURAL
  Administered 2021-01-01: 60 mg via PERINEURAL

## 2021-01-01 MED ORDER — OXYCODONE HCL 5 MG/5ML PO SOLN: 5.0000 mg | Freq: Once | ORAL | Status: DC | PRN

## 2021-01-01 MED ORDER — KETAMINE HCL 10 MG/ML IJ SOLN
INTRAMUSCULAR | Status: DC | PRN
  Administered 2021-01-01: 20 mg via INTRAVENOUS

## 2021-01-01 MED ORDER — METOPROLOL TARTRATE 25 MG PO TABS
ORAL_TABLET | ORAL | Status: AC
  Filled 2021-01-01: qty 1

## 2021-01-01 MED ORDER — BUPIVACAINE HCL (PF) 0.5 % IJ SOLN
INTRAMUSCULAR | Status: DC | PRN
  Administered 2021-01-01: 7 mL via PERINEURAL
  Administered 2021-01-01: 13 mL via PERINEURAL

## 2021-01-01 MED ORDER — BUPIVACAINE HCL (PF) 0.5 % IJ SOLN
INTRAMUSCULAR | Status: AC
  Filled 2021-01-01: qty 20

## 2021-01-01 MED ORDER — EPHEDRINE 5 MG/ML INJ
INTRAVENOUS | Status: AC
  Filled 2021-01-01: qty 10

## 2021-01-01 MED ORDER — PHENYLEPHRINE HCL (PRESSORS) 10 MG/ML IV SOLN
INTRAVENOUS | Status: AC
  Filled 2021-01-01: qty 1

## 2021-01-01 MED ORDER — PROPOFOL 500 MG/50ML IV EMUL
INTRAVENOUS | Status: DC | PRN
  Administered 2021-01-01: 50 ug/kg/min via INTRAVENOUS

## 2021-01-01 MED ORDER — BUPIVACAINE HCL 0.5 % IJ SOLN
INTRAMUSCULAR | Status: DC | PRN
  Administered 2021-01-01: 20 mL

## 2021-01-01 MED ORDER — FENTANYL CITRATE (PF) 100 MCG/2ML IJ SOLN
25.0000 ug | INTRAMUSCULAR | Status: DC | PRN
  Administered 2021-01-01 (×2): 25 ug via INTRAVENOUS

## 2021-01-01 MED ORDER — FENTANYL CITRATE (PF) 100 MCG/2ML IJ SOLN
25.0000 ug | INTRAMUSCULAR | Status: DC | PRN
Start: 2021-01-01 — End: 2021-01-01

## 2021-01-01 MED ORDER — ETOMIDATE 2 MG/ML IV SOLN
INTRAVENOUS | Status: DC | PRN
  Administered 2021-01-01: 4 mg via INTRAVENOUS
  Administered 2021-01-01: 8 mg via INTRAVENOUS

## 2021-01-01 MED ORDER — NALOXONE HCL 0.4 MG/ML IJ SOLN
INTRAMUSCULAR | Status: DC | PRN
  Administered 2021-01-01: .1 mg via INTRAVENOUS

## 2021-01-01 MED ORDER — EPINEPHRINE 1 MG/10ML IJ SOSY
PREFILLED_SYRINGE | INTRAMUSCULAR | Status: DC | PRN
  Administered 2021-01-01 (×2): .2 mg via INTRAVENOUS
  Administered 2021-01-01: .03 mg via INTRAVENOUS
  Administered 2021-01-01: .02 mg via INTRAVENOUS
  Administered 2021-01-01: .05 mg via INTRAVENOUS
  Administered 2021-01-01: .5 mg via INTRAVENOUS

## 2021-01-01 MED ORDER — MIDAZOLAM HCL 2 MG/2ML IJ SOLN
INTRAMUSCULAR | Status: AC
  Administered 2021-01-01: 1 mg
  Filled 2021-01-01: qty 2

## 2021-01-01 MED ORDER — SODIUM CHLORIDE 0.9 % IV BOLUS
250.0000 mL | Freq: Once | INTRAVENOUS | Status: AC
  Administered 2021-01-01: 250 mL via INTRAVENOUS

## 2021-01-01 MED ORDER — NALOXONE HCL 0.4 MG/ML IJ SOLN
INTRAMUSCULAR | Status: AC
  Filled 2021-01-01: qty 1

## 2021-01-01 MED ORDER — KETAMINE HCL 50 MG/5ML IJ SOSY
PREFILLED_SYRINGE | INTRAMUSCULAR | Status: AC
  Filled 2021-01-01: qty 5

## 2021-01-01 MED ORDER — POVIDONE-IODINE 10 % EX SWAB: 2.0000 "application " | Freq: Once | CUTANEOUS | Status: DC

## 2021-01-01 MED ORDER — PROPOFOL 10 MG/ML IV BOLUS
INTRAVENOUS | Status: AC
  Filled 2021-01-01: qty 20

## 2021-01-01 MED ORDER — PROPOFOL 10 MG/ML IV BOLUS
INTRAVENOUS | Status: DC | PRN
  Administered 2021-01-01: 30 mg via INTRAVENOUS

## 2021-01-01 MED ORDER — FENTANYL CITRATE (PF) 100 MCG/2ML IJ SOLN
INTRAMUSCULAR | Status: AC
  Administered 2021-01-01: 50 ug
  Filled 2021-01-01: qty 2

## 2021-01-01 MED ORDER — PROPOFOL 500 MG/50ML IV EMUL
INTRAVENOUS | Status: AC
  Filled 2021-01-01: qty 50

## 2021-01-01 MED ORDER — HEPARIN SODIUM (PORCINE) 5000 UNIT/ML IJ SOLN: 5000.0000 [IU] | Freq: Three times a day (TID) | INTRAMUSCULAR | Status: DC

## 2021-01-01 MED ORDER — EPINEPHRINE PF 1 MG/ML IJ SOLN
INTRAMUSCULAR | Status: AC
  Filled 2021-01-01: qty 1

## 2021-01-01 MED ORDER — CHLORHEXIDINE GLUCONATE 4 % EX LIQD: 60.0000 mL | Freq: Once | CUTANEOUS | Status: DC

## 2021-01-01 MED ORDER — HEPARIN (PORCINE) 25000 UT/250ML-% IV SOLN: 1950.0000 [IU]/h | INTRAVENOUS | Status: DC

## 2021-01-01 MED ORDER — FENTANYL CITRATE (PF) 100 MCG/2ML IJ SOLN: 50.0000 ug | Freq: Once | INTRAMUSCULAR | Status: DC

## 2021-01-01 MED ORDER — MIDAZOLAM HCL 2 MG/2ML IJ SOLN
1.0000 mg | Freq: Once | INTRAMUSCULAR | Status: AC
  Administered 2021-01-01: 1 mg via INTRAVENOUS
  Filled 2021-01-01: qty 1

## 2021-01-01 MED ORDER — EPHEDRINE SULFATE 50 MG/ML IJ SOLN
INTRAMUSCULAR | Status: DC | PRN
  Administered 2021-01-01: 20 mg via INTRAVENOUS
  Administered 2021-01-01: 30 mg via INTRAVENOUS

## 2021-01-01 MED ORDER — HEPARIN (PORCINE) 25000 UT/250ML-% IV SOLN
1950.0000 [IU]/h | INTRAVENOUS | Status: AC
  Administered 2021-01-01 – 2021-01-02 (×2): 1950 [IU]/h via INTRAVENOUS
  Filled 2021-01-01: qty 250

## 2021-01-01 MED ORDER — LIDOCAINE HCL (PF) 1 % IJ SOLN
INTRAMUSCULAR | Status: AC
  Filled 2021-01-01: qty 5

## 2021-01-01 SURGICAL SUPPLY — 50 items
BLADE MED AGGRESSIVE (BLADE) ×2 IMPLANT
BLADE OSC/SAGITTAL MD 5.5X18 (BLADE) ×2 IMPLANT
BLADE SURG 15 STRL LF DISP TIS (BLADE) ×1 IMPLANT
BLADE SURG 15 STRL SS (BLADE) ×2
BLADE SURG MINI STRL (BLADE) ×2 IMPLANT
BNDG CMPR 75X41 PLY HI ABS (GAUZE/BANDAGES/DRESSINGS) ×1
BNDG CMPR STD VLCR NS LF 5.8X4 (GAUZE/BANDAGES/DRESSINGS) ×1
BNDG CONFORM 2 STRL LF (GAUZE/BANDAGES/DRESSINGS) ×2 IMPLANT
BNDG CONFORM 3 STRL LF (GAUZE/BANDAGES/DRESSINGS) ×4 IMPLANT
BNDG ELASTIC 4X5.8 VLCR NS LF (GAUZE/BANDAGES/DRESSINGS) ×2 IMPLANT
BNDG ELASTIC 4X5.8 VLCR STR LF (GAUZE/BANDAGES/DRESSINGS) ×2 IMPLANT
BNDG ESMARK 4X12 TAN STRL LF (GAUZE/BANDAGES/DRESSINGS) ×2 IMPLANT
BNDG GAUZE 4.5X4.1 6PLY STRL (MISCELLANEOUS) ×2 IMPLANT
BNDG STRETCH 4X75 STRL LF (GAUZE/BANDAGES/DRESSINGS) ×2 IMPLANT
CANISTER SUCT 1200ML W/VALVE (MISCELLANEOUS) ×2 IMPLANT
COVER WAND RF STERILE (DRAPES) ×2 IMPLANT
DRAPE FLUOR MINI C-ARM 54X84 (DRAPES) ×2 IMPLANT
DRAPE XRAY CASSETTE 23X24 (DRAPES) ×2 IMPLANT
DURAPREP 26ML APPLICATOR (WOUND CARE) ×2 IMPLANT
ELECT REM PT RETURN 9FT ADLT (ELECTROSURGICAL) ×2
ELECTRODE REM PT RTRN 9FT ADLT (ELECTROSURGICAL) ×1 IMPLANT
GAUZE PACKING IODOFORM 1/2 (PACKING) ×2 IMPLANT
GAUZE SPONGE 4X4 12PLY STRL (GAUZE/BANDAGES/DRESSINGS) ×2 IMPLANT
GAUZE XEROFORM 1X8 LF (GAUZE/BANDAGES/DRESSINGS) ×2 IMPLANT
GLOVE BIO SURGEON STRL SZ7.5 (GLOVE) ×2 IMPLANT
GLOVE INDICATOR 8.0 STRL GRN (GLOVE) ×2 IMPLANT
GOWN STRL REUS W/ TWL XL LVL3 (GOWN DISPOSABLE) ×2 IMPLANT
GOWN STRL REUS W/TWL XL LVL3 (GOWN DISPOSABLE) ×4
KIT TURNOVER KIT A (KITS) ×2 IMPLANT
LABEL OR SOLS (LABEL) ×2 IMPLANT
MANIFOLD NEPTUNE II (INSTRUMENTS) ×2 IMPLANT
NEEDLE FILTER BLUNT 18X 1/2SAF (NEEDLE) ×1
NEEDLE FILTER BLUNT 18X1 1/2 (NEEDLE) ×1 IMPLANT
NEEDLE HYPO 25X1 1.5 SAFETY (NEEDLE) ×2 IMPLANT
NS IRRIG 500ML POUR BTL (IV SOLUTION) ×2 IMPLANT
PACK EXTREMITY ARMC (MISCELLANEOUS) ×2 IMPLANT
PAD ABD DERMACEA PRESS 5X9 (GAUZE/BANDAGES/DRESSINGS) ×2 IMPLANT
PULSAVAC PLUS IRRIG FAN TIP (DISPOSABLE) ×2
SHIELD FULL FACE ANTIFOG 7M (MISCELLANEOUS) ×2 IMPLANT
SOL .9 NS 3000ML IRR  AL (IV SOLUTION) ×2
SOL .9 NS 3000ML IRR AL (IV SOLUTION) ×1
SOL .9 NS 3000ML IRR UROMATIC (IV SOLUTION) ×1 IMPLANT
STOCKINETTE M/LG 89821 (MISCELLANEOUS) ×2 IMPLANT
STRAP SAFETY 5IN WIDE (MISCELLANEOUS) ×2 IMPLANT
SUT ETHILON 3-0 FS-10 30 BLK (SUTURE) ×2
SUT ETHILON 5-0 FS-2 18 BLK (SUTURE) ×2 IMPLANT
SUT VIC AB 4-0 FS2 27 (SUTURE) ×2 IMPLANT
SUTURE EHLN 3-0 FS-10 30 BLK (SUTURE) ×1 IMPLANT
SYR 10ML LL (SYRINGE) ×6 IMPLANT
TIP FAN IRRIG PULSAVAC PLUS (DISPOSABLE) ×1 IMPLANT

## 2021-01-01 NOTE — Consult Note (Addendum)
[place] for Heparin  Indication: atrial fibrillation  Allergies  Allergen Reactions  . Activase [Alteplase] Shortness Of Breath  . Bee Pollen Anaphylaxis  . Warfarin Sodium Nausea And Vomiting and Rash    Patient Measurements: Height: '5\' 3"'$  (160 cm) Weight: 116.4 kg (256 lb 9.9 oz) IBW/kg (Calculated) : 52.4 Heparin Dosing Weight: 76.7 kg  Vital Signs: Temp: 97.7 F (36.5 C) (02/06 1503) Temp Source: Axillary (02/06 1304) BP: 74/53 (02/06 1503) Pulse Rate: 86 (02/06 1503)  Labs: Recent Labs    12/29/20 2237 12/30/20 0619 12/30/20 1148 12/31/20 1050 12/31/20 2041 01/01/21 1238  HGB  --    < > 8.0* 7.6*  --  6.7*  HCT  --    < > 24.9* 24.6*  --  22.1*  PLT  --    < > 279 341  --  289  HEPARINUNFRC 0.29*  --  0.30  --  0.24*  --   CREATININE  --    < > 7.16* 8.37*  --  6.54*   < > = values in this interval not displayed.    Estimated Creatinine Clearance: 12.5 mL/min (A) (by C-G formula based on SCr of 6.54 mg/dL (H)).   Medical History: Past Medical History:  Diagnosis Date  . Anemia   . ASCVD (arteriosclerotic cardiovascular disease)    a. s/p prior stenting of LAD b. NSTEMI in 01/2018 requiring DES x3 to RCA 2/2 spiral dissection from ost->mid RCA but residual dzs in dRCA, LAD & OM2; c. 09/2019 Cath/PCI: LM nl, LAD 50 ISR, 65 p/m, 59m(2.5x15 Resolute Onyx DES), 90d, LCX 99d, OM2 99 (2.25x26 Resolute Onyx DES), RCA 40p/240mSR, 85d, RPDA 100ost, RPAV 50. EF 50-55%.  . Calciphylaxis   . Chronic abdominal wound infection   . COVID-19 virus infection 11/2020  . [place]ialysis patient ([facility]  . Diastolic dysfunction    a. 11/2020 Echo: EF 55-60%, mod conc LVh, gr2 DD, nl RV size/fxn. Sev dil LA. Triv MR. Mod-Sev mitral annular Ca2+. Mild-mod Ao sclerosis w/o stenosis.  . Marland KitchenSRD (end stage renal disease) (HCRant[place]   Due to membranous GN dialysis 09/1996; peritoneal dialysis --? peitonitis; difficult vascular access-->HD MWF.  . Marland KitchenGangrene of left foot (HCCop[place]   a. 11/2020 L forefoot dry gangrene involving 4th and 5th MTP joints & digits, & R 5th MTP joint.  . Marland KitchenERD (gastroesophageal reflux disease)   . Hashimoto thyroiditis   . Hyperparathyroidism   . Hypertension   . Hypothyroidism   . Medically noncompliant   . Morbid obesity (HCWeott  . PAD (peripheral artery disease) ([place]   a. 11/2020 PTA of L peroneal, PTA/thrombectomy, Viabahn stenting x 2 to the L SFA and popliteal arteries (6x25026m 6x150m16m . Persistent atrial fibrillation (HCC)[place] a. Noted during hospitalization 11/2020.    Assessment: Pharmacy consulted to start heparin for afib. No DOAC PTA noted.   2/02 - Hgb 7.5: brillinta held   Goal of Therapy:  Heparin level 0.3-0.7 units/ml Monitor platelets by anticoagulation protocol: Yes   Date Time HL  Rate/Comment 2/1 2351  0.3,   therapeutic 2/2 0837  <0.10,   subthera --2200 unit x1; f/b inc 1250 un/hr 2/2 1846  0.22,   subtherapeutic 2/3 0545 0.25,   subtherapeutic 2/3 1508  0.52,   therapeutic x 1; @ 1600 un/hr 2/3 2237  0.29  Subtherapeutic 1600un/hr 2/4 1148 0.30  LLN Therapeutic; '@1750'$  un/hr 2/4 Pt was  started on tirofiban. Heparin was held. Restarted heparin after tirofiban is completed 2/5. Will restart heparin at 1850  2/5 2041 0.24  Subtherapeutic   Plan:  Heparin restarted per Dr. Vickki Muff. Cards recommended full anticoagulation for afib, pt to be cardioverted next week. Pt will need to be on anticoagulation. Check heparin level in 6 hours. Monitor for any signs of bleeding.   CBC daily per protocol.   Oswald Hillock, PharmD, BCPS Clinical Pharmacist  01/01/2021,3:43 PM

## 2021-01-01 NOTE — Progress Notes (Addendum)
[facility] NAME: Lori [name]    MR#:  [id number]  DATE OF BIRTH:  1971/02/11  SUBJECTIVE:   Seen today after surgery. Denies pain currently but says is having some colicky abd pain she associates w/ a week of constipation. Has appetite, has had a few small watery bms but otherwise no BMs.   REVIEW OF SYSTEMS:   Review of Systems  Constitutional: Positive for malaise/fatigue. Negative for chills, fever and weight loss.  HENT: Negative for ear discharge, ear pain and nosebleeds.   Eyes: Negative for blurred vision, pain and discharge.  Respiratory: Negative for sputum production, shortness of breath, wheezing and stridor.   Cardiovascular: Negative for chest pain, palpitations, orthopnea and PND.  Gastrointestinal: Negative for abdominal pain, diarrhea, nausea and vomiting.  Genitourinary: Negative for frequency and urgency.  Musculoskeletal: Negative for back pain and joint pain.  Neurological: Positive for weakness. Negative for sensory change, speech change and focal weakness.  Psychiatric/Behavioral: Negative for depression and hallucinations. The patient is not nervous/anxious.    Tolerating Diet:yes Tolerating PT:   DRUG ALLERGIES:   Allergies  Allergen Reactions  . Activase [Alteplase] Shortness Of Breath  . Bee Pollen Anaphylaxis  . Warfarin Sodium Nausea And Vomiting and Rash    VITALS:  Blood pressure (!) 83/62, pulse 62, temperature 98.1 F (36.7 C), temperature source Axillary, resp. rate 18, height '5\' 3"'$  (1.6 m), weight 116.4 kg, SpO2 93 %.  PHYSICAL EXAMINATION:   Physical Exam  GENERAL:  50 y.o.-year-old patient lying in the bed with no acute distress. Appears chronically ill LUNGS: Normal breath sounds bilaterally, no wheezing, rales, rhonchi. No use of accessory muscles of respiration.  CARDIOVASCULAR: S1, S2 normal. No murmurs, rubs, or gallops. Tachycardia/a fib ABDOMEN: Soft, mild ttp throughout,  obese. Bowel sounds present. EXTREMITIES: bandage left foot NEUROLOGIC: moves all extremities well. Decreased sensation in feet.   PSYCHIATRIC:  patient is alert and oriented x 3.  SKIN: bronze color  LABORATORY PANEL:  CBC Recent Labs  Lab 01/01/21 1238  WBC 17.7*  HGB 6.7*  HCT 22.1*  PLT 289    Chemistries  Recent Labs  Lab 12/26/20 0600 12/29/20 0545 01/01/21 1238  NA 134*   < > 138  K 5.1   < > 4.2  CL 97*   < > 101  CO2 25   < > 23  GLUCOSE 107*   < > 87  BUN 50*   < > 39*  CREATININE 9.01*   < > 6.54*  CALCIUM 8.5*   < > 8.3*  MG 1.7  --   --    < > = values in this interval not displayed.   Cardiac Enzymes No results for input(s): TROPONINI in the last 168 hours. RADIOLOGY:  [place] OR NERVE BLOCK-IMAGE ONLY [facility])  Result Date: 01/01/2021 There is no interpretation for this exam.  This order is for images obtained during a surgical procedure.  Please See "Surgeries" Tab for more information regarding the procedure.   DG MINI C-ARM IMAGE ONLY  Result Date: 01/01/2021 There is no interpretation for this exam.  This order is for images obtained during a surgical procedure.  Please See "Surgeries" Tab for more information regarding the procedure.   ASSESSMENT AND PLAN:  50 y.o.femalewith medical history significant forCAD s/p stent to LAD in 2020, ESRD on HD MWF, hypertension, hypothyroidism, hyperparathyroidism, GERD who was initially admitted to [facility] on 12/14/2020 for a left  foot wound.  She was started on antibiotics.  General surgery evaluated the patient and opined that she may ultimately need a left AKA.  Patient wanted to discuss with her vascular surgeon Dr. Delana Meyer before any surgery would be considered.  She was transferred to [facility] for further evaluation   Rapid a fib with RVR/hypotension/questionable septic shock in the setting of ESRB and left foot infection Patient was transferred to ICU on 30 January. she received bolus of amiodarone,  digoxin, IV Cardizem drip now off. Levophed discontinued, remains on midodrine. Cardiology following. HR wnl today - cont midodrine - lopressor q6 - avoiding digoxin given esrd; amio a consideration - brillinta on hold given declining h/h - continue asa; anticoagulation per cardiology - cardiology considering cardioversion next week  Left foot wound infection with gangrene/chronic ischemia of the left lower extremity, now s/p ray amputation of left 4th and 5th toes On admission necrotic/ischemic appearance of left fourth and fifth toes. transferredfrom [name] for vascular surgery evaluation. Status post aortogram and left lower extremity angiogram with angioplasty of left peroneal artery/left SFA and popliteal arteries along with mechanical thrombectomy and stent placement of the left SFA and popliteal artery on 12/22/2020, with repeat Angiogram on 2/4 with angioplasty and multiple stents placed. Now s/p ray amputation of left 4th and 5th toes by podiatry on 2/6, immediate post-op course complicated by hypotension. - follow vascular surgery and podiatry recs - Antibiotics: cefepime/flagyl 1/26-1/27, zosyn 1/27-2/1, unasyn 2/1>. Wound culture has grown Enterococcus faecalis and Proteus vulgaris. Prior provider discussed abx regimen w/ ID pharmacist. Podiatry advises 24 more hours abx then stop - pt/ot consults, will likely need rehab  End-stage renal disease on hemodialysis Hyperkalemia Nephrology following.  Continue dialysis as per nephrology schedule t/t/s. Hyperkalemia resolved w/ dialysis  Anemia of chronic disease H downtrending to 6.7 today. No melena or other bleeding - brillinta on hold; asa continuing - will transfuse one unit today - epo w/ HD - f/u b12/folate given macrocytosis  Bronze skin Iron overload Ferritin and tsat markedly elevated, likely 2/2 chronic esrd with blood and iron transfusions. Curbsided nephrology, this can be addressed as outpatient by avoiding future  iron administration  COVID-19 positive -Incidentally Covid + 12/14/2020. She has been asymptomatic. She states she has been fully vaccinated including booster dose. She received 3 days IV remdesivir. Currently no need for further treatment. Precautions discontinued  CAD: s/p multiple stents -Continue aspirin, Coreg and statin --follows with Dr Skipper Cliche [facility])  Hypertension: with relative hypotension -Monitor blood pressure.  Continue Coreg  Hypothyroidism: -Continue Synthroid  Constipation - have made senna/colace standing (has not been receiving prn) and added miralax qd - for ongoing constipation will order soap suds enema   DVT prophylaxis: Heparin Code Status: Full Family Communication: None Disposition Plan: tbd pending further w/u of ischemic toe Status is: Inpatient  Remains inpatient appropriate because:Inpatient level of care appropriate due to severity of illness   Dispo: The patient is from: Home  Anticipated d/c is to: tbd  Anticipated d/c date is: > 3 days  Patient currently is not medically stable to d/c.              Difficult to place patient No  Consultants: Vascular surgery/nephrology/podiatry/ cardilogy  Procedures:  - aortogram and left lower extremity angiogram with angioplasty of left peroneal artery/left SFA and popliteal arteries along with mechanical thrombectomy and stent placement of the left SFA and popliteal artery on 12/22/2020.  - Repeat angiogram with angioplasty and stent placement 2/4. -  ray amputation left 4th and 5th toes 2/6    Level of care: Med-Surg Status is: Inpatient    TOTAL TIME TAKING CARE OF THIS PATIENT: 24 min    Desma Maxim M.D    [facility]

## 2021-01-01 NOTE — Progress Notes (Signed)
Patient underwent left fourth and fifth ray amputation.  During the procedure patient did become bradycardic and her blood pressure did diminish.  She was stabilized intraoperatively.  At this time anesthesia recommends for any further interventional types of procedures requiring anesthesia she likely would benefit from transfer to tertiary care facility.

## 2021-01-01 NOTE — Progress Notes (Signed)
PT Cancellation Note  Patient Details Name: Lori [name] MRN: [id number] DOB: June 02, 1971   Cancelled Treatment:    Reason Eval/Treat Not Completed: Patient not medically ready PT orders received, chart reviewed. Pt noted to have low BP (83/62 mmHg) as well as low Hgb & hematocrit (6.7 and 22.1 respectively). PT intervention contraindicated at this time. Will f/u as pt is medically stable.   Lavone Nian, PT, DPT 01/01/21, 1:29 PM    Waunita Schooner 01/01/2021, 1:28 PM

## 2021-01-01 NOTE — Op Note (Signed)
Operative note   Surgeon:Lyndy Russman Lawyer: None    Preop diagnosis: Gangrene left fourth and fifth rays    Postop diagnosis: Same    Procedure: Amputation left fourth and fifth ray    EBL: Minimal    Anesthesia:local, regional and IV sedation.  Patient had a popliteal block in the preop holding area.  Also supplemented with 20 cc of 0.5% bupivacaine locally around the surgical site    Hemostasis: None    Specimen: Gangrene left fourth and fifth rays as well as deep wound culture    Complications: Patient did become bradycardic and blood pressure was diminished intraoperatively.  Patient was stabilized by anesthesia.    Operative indications:Lori [name] is an 50 y.o. that presents today for surgical intervention.  The risks/benefits/alternatives/complications have been discussed and consent has been given.    Procedure:  Patient was brought into the OR and left in her floor bed for surgery in the supine position. After anesthesia was obtained theleft lower extremity was prepped and draped in usual sterile fashion.  Attention was directed to the lateral aspect of the left foot where a large drop type incision was made coursing from the dorsal aspect of the fifth metatarsal encompassing the fourth and fifth toes distally.  Full-thickness incision was taken down to the level of the fourth and fifth metatarsals.  Osteotomies were created at the proximal aspect of the fourth and fifth metatarsals.  The fourth and fifth rays were then removed from the surgical field in toto.  Minimal bleeding was noted.  A deep wound culture was performed and tissue was sent for pathology.  The wound was flushed with copious amounts of irrigation.  Closure was then performed with 3-0 Vicryl and a 3-0 nylon for skin.  A bulky sterile dressing was applied.  Towards the last portion of the surgical procedure patient did become bradycardic and her blood pressure did drop.  She was stabilized  intraoperatively.    Patient tolerated the procedure and anesthesia well.  Was transported from the OR to the PACU with all vital signs stable and vascular status intact. To be discharged per routine protocol back to the floor.

## 2021-01-01 NOTE — Anesthesia Preprocedure Evaluation (Signed)
Anesthesia Evaluation  Patient identified by MRN, date of birth, ID band Patient awake    Reviewed: Allergy & Precautions, H&P , NPO status , Patient's Chart, lab work & pertinent test results, reviewed documented beta blocker date and time   History of Anesthesia Complications Negative for: history of anesthetic complications  Airway Mallampati: III  TM Distance: >3 FB Neck ROM: full    Dental  (+) Dental Advidsory Given, Poor Dentition, Missing   Pulmonary shortness of breath and with exertion, asthma , neg sleep apnea, neg COPD, Recent URI  (Covid positive and is s/p treatment), former smoker,    Pulmonary exam normal        Cardiovascular Exercise Tolerance: Good hypertension, (-) angina+ CAD, + Past MI, + Cardiac Stents and + Peripheral Vascular Disease  (-) CABG Normal cardiovascular exam+ dysrhythmias Atrial Fibrillation (-) Valvular Problems/Murmurs     Neuro/Psych negative neurological ROS  negative psych ROS   GI/Hepatic Neg liver ROS, GERD  ,  Endo/Other  neg diabetesHypothyroidism Morbid obesity  Renal/GU ESRF and DialysisRenal disease  negative genitourinary   Musculoskeletal   Abdominal   Peds  Hematology negative hematology ROS (+)   Anesthesia Other Findings COVID+  Past Medical History: No date: Anemia No date: ASCVD (arteriosclerotic cardiovascular disease)     Comment:  a. s/p prior stenting of LAD b. 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 peitonitis; difficult vascular access No date: GERD (gastroesophageal reflux  disease) No date: Hashimoto thyroiditis No date: Hyperparathyroidism No date: Hypertension No date: Hypothyroidism No date: Medically noncompliant No date: Morbid obesity ([place])   Reproductive/Obstetrics negative OB ROS                             Anesthesia Physical  Anesthesia Plan  ASA: IV  Anesthesia Plan: General   Post-op Pain Management: GA combined w/ Regional for post-op pain   Induction: Intravenous  PONV Risk Score and Plan: TIVA and Propofol infusion  Airway Management Planned: Natural Airway and Nasal Cannula  Additional Equipment:   Intra-op Plan:   Post-operative Plan:   Informed Consent: I have reviewed the patients History and Physical, chart, labs and discussed the procedure including the risks, benefits and alternatives for the proposed anesthesia with the patient or authorized representative who has indicated his/her understanding and acceptance.     Dental Advisory Given  Plan Discussed with: Anesthesiologist, CRNA and Surgeon  Anesthesia Plan Comments: (Discussed risk and benefits in PNB in setting of infection.  Patient voiced understanding and consent.  Patient consented for risks of anesthesia including but not limited to:  - adverse reactions to medications - risk of airway placement if required - damage to eyes, teeth, lips or other oral mucosa - nerve damage due to positioning  - sore throat or hoarseness - Damage  to heart, brain, nerves, lungs, other parts of body or loss of life  Patient voiced understanding.)        Anesthesia Quick Evaluation

## 2021-01-01 NOTE — Anesthesia Postprocedure Evaluation (Signed)
Anesthesia Post Note  Patient: [place]  Procedure(s) Performed: AMPUTATION RAY-4th & 5th (Left Foot)  Patient location during evaluation: PACU Anesthesia Type: General Level of consciousness: awake and alert Pain management: pain level controlled Vital Signs Assessment: post-procedure vital signs reviewed and stable Respiratory status: spontaneous breathing, nonlabored ventilation, respiratory function stable and patient connected to nasal cannula oxygen Cardiovascular status: blood pressure returned to baseline and stable Postop Assessment: no apparent nausea or vomiting Anesthetic complications: no   No complications documented.   Last Vitals:  Vitals:   01/01/21 1124 01/01/21 1135  BP: 101/73 (!) 91/57  Pulse:    Resp: 18 17  Temp:    SpO2: 97%     Last Pain:  Vitals:   01/01/21 1124  TempSrc:   PainSc: 0-No pain                 Precious Haws Zoye Chandra

## 2021-01-01 NOTE — Transfer of Care (Signed)
Immediate Anesthesia Transfer of Care Note  Patient: Lori [name]  Procedure(s) Performed: AMPUTATION RAY-4th & 5th (Left Foot)  Patient Location: PACU  Anesthesia Type:General and Regional  Level of Consciousness: sedated  Airway & Oxygen Therapy: Patient Spontanous Breathing and Patient connected to face mask oxygen  Post-op Assessment: Report given to RN and Post -op Vital signs reviewed and stable  Post vital signs: Reviewed and stable pulse ox intermitent but patient opens eyes to her name.  Color looks normal.  Spont/patent airway.  FM O2.   Last Vitals:  Vitals Value Taken Time  BP 103/77 01/01/21 0950  Temp    Pulse 33 01/01/21 0952  Resp 20 01/01/21 0955  SpO2 99 % 01/01/21 0952  Vitals shown include unvalidated device data.  Last Pain:  Vitals:   01/01/21 0536  TempSrc:   PainSc: 10-Worst pain ever      Patients Stated Pain Goal: 1 ([id number])  Complications: No complications documented.

## 2021-01-01 NOTE — Progress Notes (Signed)
A consult was placed to the IV Therapist; Lab unable to draw blood for type/screen;   Pt needing 2 sites, one for continuous heparin drip, and one for blood tx;  Attempted x 1 in Left ant forearm w ultrasound; unable to thread catheter completely into the vein due to scar tissue;  Pt has a HD cath; there is a care note from Dr Theador Hawthorne (nephrology) for IV Team to access the [facility] for labs and fluids;  This would need to be clarified in order to use the [facility] for a blood transfusion; RN aware and is messaging Renal MD on call;  Also, pharmacy does NOT currently have the correct concentration of heparin to instill back into the [facility] after blood is transfused;  Unable to reach on-call RN from Dialysis to obtain heparin ;  Patient currently has heparin infusing into the peripheral iv in her left forearm.  No further attempts made to start a 2nd peripheral line.

## 2021-01-01 NOTE — Anesthesia Procedure Notes (Signed)
Anesthesia Regional Block: Popliteal block   Pre-Anesthetic Checklist: ,, timeout performed, Correct Patient, Correct Site, Correct Laterality, Correct Procedure, Correct Position, site marked, Risks and benefits discussed,  Surgical consent,  Pre-op evaluation,  At surgeon's request and post-op pain management  Laterality: Lower and Left  Prep: chloraprep       Needles:  Injection technique: Single-shot  Needle Type: Echogenic Needle     Needle Length: 9cm  Needle Gauge: 21     Additional Needles:   Procedures:,,,, ultrasound used (permanent image in chart),,,,  Narrative:  Start time: 01/01/2021 8:10 AM End time: 01/01/2021 8:25 AM Injection made incrementally with aspirations every 5 mL.  Performed by: Personally  Anesthesiologist: Meriam Chojnowski, Precious Haws, MD  Additional Notes: Patient consented for risk and benefits of nerve block including but not limited to nerve damage, failed block, bleeding and infection.  Patient voiced understanding.  Functioning IV was confirmed and monitors were applied.  Timeout done prior to procedure and prior to any sedation being given to the patient.  Patient confirmed procedure site prior to any sedation given to the patient.  A 70m 22ga Stimuplex needle was used. Sterile prep,hand hygiene and sterile gloves were used.  Minimal sedation used for procedure.  No paresthesia endorsed by patient during the procedure.  Negative aspiration and negative test dose prior to incremental administration of local anesthetic. The patient tolerated the procedure well with no immediate complications.

## 2021-01-01 NOTE — Progress Notes (Signed)
Subjective  - POD #2, s/p kissing iliac stents, Viabahn stenting of the right SFA and popliteal artery, and angioplasty of the right anterior tibial artery     The patient underwent fourth and fifth ray amputation today minimal bleeding was encountered.  The patient did become bradycardic during her operation. Physical Exam:  Postoperative dressing intact       Assessment/Plan:  POD #2  The wound will need to be monitored given minimal bleeding at the time of surgery.  This may lead to a more proximal amputation  Wells Caz Weaver 01/01/2021 5:43 PM --  Vitals:   01/01/21 1600 01/01/21 1700  BP: 97/62 96/70  Pulse: (!) 132 (!) 108  Resp: 18 18  Temp: 98.1 F (36.7 C)   SpO2: 90%     Intake/Output Summary (Last 24 hours) at 01/01/2021 1743 Last data filed at 01/01/2021 0947 Gross per 24 hour  Intake 318.54 ml  Output 5 ml  Net 313.54 ml     Laboratory CBC    Component Value Date/Time   WBC 17.7 (H) 01/01/2021 1238   HGB 6.7 (L) 01/01/2021 1238   HGB 10.0 (L) 03/26/2014 1842   HCT 22.1 (L) 01/01/2021 1238   HCT 32.0 (L) 03/26/2014 1842   PLT 289 01/01/2021 1238   PLT 332 03/26/2014 1425    BMET    Component Value Date/Time   NA 138 01/01/2021 1238   NA 138 03/26/2014 1425   K 4.2 01/01/2021 1238   K 3.5 03/26/2014 1425   CL 101 01/01/2021 1238   CL 99 03/26/2014 1425   CO2 23 01/01/2021 1238   CO2 26 03/26/2014 1425   GLUCOSE 87 01/01/2021 1238   GLUCOSE 79 03/26/2014 1425   BUN 39 (H) 01/01/2021 1238   BUN 36 (H) 03/26/2014 1425   CREATININE 6.54 (H) 01/01/2021 1238   CREATININE 9.54 (H) 03/26/2014 1425   CALCIUM 8.3 (L) 01/01/2021 1238   CALCIUM 7.7 (L) 03/26/2014 1425   GFRNONAA 7 (L) 01/01/2021 1238   GFRNONAA 4 (L) 03/26/2014 1425   GFRAA 7 (L) 10/22/2019 0240   GFRAA 5 (L) 03/26/2014 1425    COAG Lab Results  Component Value Date   INR 1.13 02/09/2018   INR 1.25 03/27/2014   INR 1.1 03/26/2014   No results found for:  PTT  Antibiotics Anti-infectives (From admission, onward)   Start     Dose/Rate Route Frequency Ordered Stop   12/30/20 0945  ceFAZolin (ANCEF) IVPB 1 g/50 mL premix       Note to Pharmacy: To be given in specials   1 g 100 mL/hr over 30 Minutes Intravenous  Once 12/30/20 0846 12/30/20 1335   12/27/20 1800  Ampicillin-Sulbactam (UNASYN) 3 g in sodium chloride 0.9 % 100 mL IVPB        3 g 200 mL/hr over 30 Minutes Intravenous Every 12 hours 12/27/20 1326 12/30/20 0603   12/22/20 1400  piperacillin-tazobactam (ZOSYN) IVPB 2.25 g  Status:  Discontinued        2.25 g 100 mL/hr over 30 Minutes Intravenous Every 8 hours 12/22/20 1314 12/27/20 1326   12/22/20 1330  ceFAZolin (ANCEF) IVPB 1 g/50 mL premix       Note to Pharmacy: To be given in specials   1 g 100 mL/hr over 30 Minutes Intravenous 60 min pre-op 12/22/20 0052 12/22/20 1607   12/22/20 1257  ceFAZolin (ANCEF) 1-4 GM/50ML-% IVPB       Note to Pharmacy: Corlis Hove   :  cabinet override      12/22/20 1257 12/22/20 1610   12/22/20 1200  vancomycin (VANCOCIN) IVPB 1000 mg/200 mL premix  Status:  Discontinued        1,000 mg 200 mL/hr over 60 Minutes Intravenous Every T-Th-Sa (Hemodialysis) 12/22/20 1128 12/22/20 1314   12/22/20 0145  ceFAZolin (ANCEF) IVPB 1 g/50 mL premix  Status:  Discontinued       Note to Pharmacy: To be given in specials   1 g 100 mL/hr over 30 Minutes Intravenous  Once 12/22/20 0048 12/22/20 0050   12/21/20 1200  ceFEPIme (MAXIPIME) 2 g in sodium chloride 0.9 % 100 mL IVPB  Status:  Discontinued        2 g 200 mL/hr over 30 Minutes Intravenous Every M-W-F (Hemodialysis) 12/20/20 2038 12/22/20 1314   12/21/20 1200  vancomycin (VANCOCIN) IVPB 1000 mg/200 mL premix  Status:  Discontinued        1,000 mg 200 mL/hr over 60 Minutes Intravenous Every M-W-F (Hemodialysis) 12/20/20 2038 12/22/20 1128   12/20/20 2200  metroNIDAZOLE (FLAGYL) tablet 500 mg  Status:  Discontinued        500 mg Oral Every 8 hours  12/20/20 2038 12/22/20 1314       V. Leia Alf, M.D., [facility] [place] Office: [phone] Pager:  [phone]

## 2021-01-02 ENCOUNTER — Encounter: Payer: Self-pay | Admitting: Podiatry

## 2021-01-02 LAB — BASIC METABOLIC PANEL
Anion gap: 14 (ref 5–15)
BUN: 42 mg/dL — ABNORMAL HIGH (ref 6–20)
CO2: 25 mmol/L (ref 22–32)
Calcium: 8.4 mg/dL — ABNORMAL LOW (ref 8.9–10.3)
Chloride: 99 mmol/L (ref 98–111)
Creatinine, Ser: 7.33 mg/dL — ABNORMAL HIGH (ref 0.44–1.00)
GFR, Estimated: 6 mL/min — ABNORMAL LOW (ref >60)
Glucose, Bld: 100 mg/dL — ABNORMAL HIGH (ref 70–99)
Potassium: 4.5 mmol/L (ref 3.5–5.1)
Sodium: 138 mmol/L (ref 135–145)

## 2021-01-02 LAB — HEPARIN LEVEL (UNFRACTIONATED)
Heparin Unfractionated: 0.29 IU/mL — ABNORMAL LOW (ref 0.30–0.70)
Heparin Unfractionated: 0.63 IU/mL (ref 0.30–0.70)
Heparin Unfractionated: 0.8 IU/mL — ABNORMAL HIGH (ref 0.30–0.70)

## 2021-01-02 LAB — CBC
HCT: 22.7 % — ABNORMAL LOW (ref 36.0–46.0)
Hemoglobin: 7 g/dL — ABNORMAL LOW (ref 12.0–15.0)
MCH: 31.5 pg (ref 26.0–34.0)
MCHC: 30.8 g/dL (ref 30.0–36.0)
MCV: 102.3 fL — ABNORMAL HIGH (ref 80.0–100.0)
Platelets: 320 10*3/uL (ref 150–400)
RBC: 2.22 MIL/uL — ABNORMAL LOW (ref 3.87–5.11)
RDW: 16.4 % — ABNORMAL HIGH (ref 11.5–15.5)
WBC: 16 10*3/uL — ABNORMAL HIGH (ref 4.0–10.5)
nRBC: 0.9 % — ABNORMAL HIGH (ref 0.0–0.2)

## 2021-01-02 LAB — PREPARE RBC (CROSSMATCH)

## 2021-01-02 MED ORDER — HEPARIN BOLUS VIA INFUSION
1150.0000 [IU] | Freq: Once | INTRAVENOUS | Status: AC
  Administered 2021-01-02: 1150 [IU] via INTRAVENOUS
  Filled 2021-01-02: qty 1150

## 2021-01-02 MED ORDER — FOLIC ACID 1 MG PO TABS
1.0000 mg | ORAL_TABLET | Freq: Every day | ORAL | Status: DC
  Administered 2021-01-02 – 2021-01-10 (×9): 1 mg via ORAL
  Filled 2021-01-02 (×9): qty 1

## 2021-01-02 MED ORDER — HEPARIN (PORCINE) 25000 UT/250ML-% IV SOLN
2100.0000 [IU]/h | INTRAVENOUS | Status: AC
  Administered 2021-01-02 (×2): 2100 [IU]/h via INTRAVENOUS
  Filled 2021-01-02: qty 250

## 2021-01-02 MED ORDER — HEPARIN BOLUS VIA INFUSION
1150.0000 [IU] | Freq: Once | INTRAVENOUS | Status: DC
  Filled 2021-01-02: qty 1150

## 2021-01-02 NOTE — Transfer of Care (Signed)
Immediate Anesthesia Transfer of Care Note  Patient: Lori [name]  Procedure(s) Performed: Lower Extremity Angiography (Right )  Patient Location: PACU  Anesthesia Type:General  Level of Consciousness: sedated  Airway & Oxygen Therapy: Patient Spontanous Breathing and Patient connected to nasal cannula oxygen  Post-op Assessment: Report given to RN and Post -op Vital signs reviewed and stable  Post vital signs: Reviewed and stable  Last Vitals:  Vitals Value Taken Time  BP 120/80 01/02/21 0804  Temp 36.4 C 01/02/21 0804  Pulse 68 01/02/21 0804  Resp 19 01/02/21 0804  SpO2 90 % 01/02/21 0804    Last Pain:  Vitals:   01/02/21 0804  TempSrc: Oral  PainSc:       Patients Stated Pain Goal: 1 ([id number])  Complications: No complications documented.

## 2021-01-02 NOTE — Progress Notes (Signed)
Progress Note  Patient Name: Lori [name] Date of Encounter: 01/02/2021  [facility] Cardiologist: Dr. Terrence Dupont  Subjective   She had left amputation yesterday.  At the end of the surgery, she did have transient hypotension and bradycardia that responded to treatment.  Metoprolol is currently being held due to relatively low blood pressure.  She continues to be in atrial fibrillation with ventricular rate between 80 and 100.  She denies chest pain or shortness of breath.  Inpatient Medications    Scheduled Meds: . acetaminophen  650 mg Oral Q8H  . aspirin EC  81 mg Oral Daily  . atorvastatin  40 mg Oral Daily  . Chlorhexidine Gluconate Cloth  6 each Topical Daily  . epoetin (EPOGEN/PROCRIT) injection  8,000 Units Intravenous Q T,Th,Sa-HD  . fentaNYL  50 mcg Intravenous Once  . gabapentin  100 mg Oral QHS  . levothyroxine  300 mcg Oral QHS  . metoprolol tartrate  12.5 mg Oral Q6H  . midodrine  10 mg Oral TID WC  . pantoprazole  40 mg Oral QHS  . polyethylene glycol  17 g Oral Daily  . senna-docusate  2 tablet Oral QHS  . sevelamer carbonate  3,200 mg Oral TID WC  . sodium chloride flush  10-40 mL Intracatheter Q12H   Continuous Infusions: . sodium chloride 0 mL (12/30/20 1523)  . heparin 2,100 Units/hr (01/02/21 [id number])   PRN Meds: albuterol, HYDROcodone-acetaminophen, morphine injection, ondansetron **OR** ondansetron (ZOFRAN) IV, ondansetron (ZOFRAN) IV, sevelamer carbonate, sodium chloride flush   Vital Signs    Vitals:   01/01/21 2105 01/02/21 0126 01/02/21 0542 01/02/21 0804  BP: 101/76 99/73 111/84 120/80  Pulse: 90 96 95 68  Resp: '18 17 18 19  '$ Temp: 97.8 F (36.6 C) 98 F (36.7 C) 97.6 F (36.4 C) 97.6 F (36.4 C)  TempSrc:  Oral  Oral  SpO2: 92% 100% 100% 90%  Weight:      Height:        Intake/Output Summary (Last 24 hours) at 01/02/2021 0928 Last data filed at 01/01/2021 0947 Gross per 24 hour  Intake 300 ml  Output 5 ml  Net 295 ml   Last 3  Weights 12/31/2020 12/30/2020 12/29/2020  Weight (lbs) 256 lb 9.9 oz 250 lb 3.6 oz 250 lb 14.1 oz  Weight (kg) 116.4 kg 113.5 kg 113.8 kg      Telemetry    Atrial fibrillation, rates 80s-100s Personally Reviewed  ECG    No new since 1/19 - Personally Reviewed  Physical Exam   GEN: Well nourished, well developed in no acute distress. [place] in place. R HD catheter NECK: No JVD appreciated but difficult body habitus CARDIAC: irregularly irregular rhythm, normal S1 and S2, no rubs or gallops. No murmur. VASCULAR: Radial pulses 1+ bilaterally.  RESPIRATORY:  Anteriorly clear ABDOMEN: Soft, non-tender, non-distended MUSCULOSKELETAL:  Moves all 4 limbs independently SKIN: firm dependent edema in bilateral legs (up to hips), equally firm in posterior thighs, mildly tender on the left posterior thigh.  NEUROLOGIC:  No focal neuro deficits noted. PSYCHIATRIC:  Normal affect    Labs    High Sensitivity Troponin:  No results for input(s): TROPONINIHS in the last 720 hours.    Chemistry Recent Labs  Lab 12/30/20 1148 12/31/20 1050 01/01/21 1238 01/02/21 0512  NA 138 136 138 138  K 4.5 5.1 4.2 4.5  CL 97* 96* 101 99  CO2 '23 22 23 25  '$ GLUCOSE 94 98 87 100*  BUN 39* 51*  39* 42*  CREATININE 7.16* 8.37* 6.54* 7.33*  CALCIUM 9.0 9.0 8.3* 8.4*  ALBUMIN 2.9*  --   --   --   GFRNONAA 6* 5* 7* 6*  ANIONGAP 18* 18* 14 14     Hematology Recent Labs  Lab 12/31/20 1050 01/01/21 1238 01/02/21 0512  WBC 17.8* 17.7* 16.0*  RBC 2.46* 2.14* 2.22*  HGB 7.6* 6.7* 7.0*  HCT 24.6* 22.1* 22.7*  MCV 100.0 103.3* 102.3*  MCH 30.9 31.3 31.5  MCHC 30.9 30.3 30.8  RDW 15.9* 16.2* 16.4*  PLT 341 289 320    BNPNo results for input(s): BNP, PROBNP in the last 168 hours.   DDimer No results for input(s): DDIMER in the last 168 hours.   Radiology    [place] OR NERVE BLOCK-IMAGE ONLY [facility])  Result Date: 01/01/2021 There is no interpretation for this exam.  This order is for images obtained during a  surgical procedure.  Please See "Surgeries" Tab for more information regarding the procedure.   DG MINI C-ARM IMAGE ONLY  Result Date: 01/01/2021 There is no interpretation for this exam.  This order is for images obtained during a surgical procedure.  Please See "Surgeries" Tab for more information regarding the procedure.    Cardiac Studies   Echo 1. Left ventricular ejection fraction, by estimation, is 55 to 60%. The  left ventricle has normal function. Left ventricular endocardial border  not optimally defined to evaluate regional wall motion. There is moderate  left ventricular hypertrophy. Left  ventricular diastolic parameters are consistent with Grade II diastolic  dysfunction (pseudonormalization).  3. Left atrial size was severely dilated.   Patient Profile     50 y.o. female history of CAD, (prior PCI's to LAD, RCA, OM), PAD s/p recent left SFA proximal popliteal stenting (12/22/2020), ESRD on HD MWF, left foot infection being seen in consultation for A. fib with RVR  Assessment & Plan    Paroxysmal atrial fibrillation -in afib since evening of 12/25/20, was not therapeutically anticoagulated until ~48 hours later -hypotension limits rate control -not ideal to use amiodarone given potential for chemical cardioversion, but if she becomes unstable this may be only option -Continue small dose metoprolol 12.5 mg every 6 hours as long as blood pressure tolerates.   -Continue heparin drip for now and once we make sure that there are no need for further invasive procedures or surgeries, recommend transitioning to Eliquis 5 mg twice daily. Cardioversion can be considered 3 weeks after uninterrupted anticoagulation or can be done with TEE before the if ventricular rate is difficult to control.   PAD, gangrene, bilateral foot necrosis -S/P PTCA left peroneal, thrombectomy L SFA and L popliteal followed by PTCA 12/22/20 -S/P PTCA R anterior tibial, R SFA, R popliteal, and bilateral  common iliacs 12/30/20 -planned for OR tomorrow for amputation -The patient is currently on aspirin.  CAD, prior PCI -EF preserved, denies symptoms -continue antiplatelet, currently on aspirin  -continue atorvastatin 40 mg daily -The patient was on Brilinta as an outpatient but can likely transition to Eliquis due to atrial fibrillation.  If a different antiplatelet medication is required per vascular surgery, then a combination of clopidogrel and Eliquis without aspirin would be reasonable.  End-stage renal disease -Per nephrology  Hypotension -on midodrine chronically for BP support -limits options for management of atrial fibrillation  For questions or updates, please contact [place] Please consult [email or web address] for contact info under     Signed, Kathlyn Sacramento, MD  01/02/2021, 9:28 AM

## 2021-01-02 NOTE — Progress Notes (Signed)
[facility], [place] 01/02/21  Subjective:   Patient resting quietly in bed. Able to answer questions appropriately Denies shortness of breath or chest discomfort She reports eating breakfast  Objective:  Vital signs in last 24 hours:  Temp:  [97.3 F (36.3 C)-98.1 F (36.7 C)] 97.9 F (36.6 C) (02/07 1109) Pulse Rate:  [60-132] 94 (02/07 1109) Resp:  [15-19] 19 (02/07 1109) BP: (70-120)/(52-94) 116/94 (02/07 1109) SpO2:  [87 %-100 %] 100 % (02/07 1109)  Weight change:  Filed Weights   12/29/20 2030 12/30/20 0359 12/31/20 0433  Weight: 113.8 kg 113.5 kg 116.4 kg    Intake/Output:    Intake/Output Summary (Last 24 hours) at 01/02/2021 1242 Last data filed at 01/02/2021 1015 Gross per 24 hour  Intake 240 ml  Output --  Net 240 ml     Physical Exam: General: Resting quietly in bed, NAD  HEENT  normocephalic, atraumatic  Pulm/lungs Clear bilaterally  CVS/Heart  S1-S2 present, irregular rhythm  Abdomen:  soft, nondistended  Extremities: No peripheral edema  Neurologic:  Alert, able to answer simple questions appropriately  Skin:  No acute lesions or rashes  Access: RIJ permcath       Basic Metabolic Panel:  Recent Labs  Lab 12/30/20 0619 12/30/20 1148 12/31/20 1050 01/01/21 1238 01/02/21 0512  NA 134* 138 136 138 138  K 4.2 4.5 5.1 4.2 4.5  CL 98 97* 96* 101 99  CO2 '24 23 22 23 25  '$ GLUCOSE 92 94 98 87 100*  BUN 38* 39* 51* 39* 42*  CREATININE 6.83* 7.16* 8.37* 6.54* 7.33*  CALCIUM 8.8* 9.0 9.0 8.3* 8.4*  PHOS  --  4.5  --   --   --      CBC: Recent Labs  Lab 12/30/20 0619 12/30/20 1148 12/31/20 1050 01/01/21 1238 01/02/21 0512  WBC 10.1 12.9* 17.8* 17.7* 16.0*  HGB 7.7* 8.0* 7.6* 6.7* 7.0*  HCT 25.7* 24.9* 24.6* 22.1* 22.7*  MCV 104.0* 98.8 100.0 103.3* 102.3*  PLT 229 279 341 289 320      Lab Results  Component Value Date   HEPBSAG NON REACTIVE 12/25/2020      Microbiology:  Recent Results (from the  past 240 hour(s))  Aerobic/Anaerobic Culture (surgical/deep wound)     Status: None (Preliminary result)   Collection Time: 01/01/21  9:24 AM   Specimen: PATH Other; Tissue  Result Value Ref Range Status   Specimen Description   Final    TOE LEFT Performed at [facility], [address], [place], [place] [address]    Special Requests   Final    NONE Performed at [facility], [address] [place], [place] [address]    Gram Stain NO WBC SEEN NO ORGANISMS SEEN   Final   Culture   Final    NO GROWTH < 24 HOURS Performed at [facility], [place] [address] [place], [place] [address]    Report Status PENDING  Incomplete    Coagulation Studies: No results for input(s): LABPROT, INR in the last 72 hours.  Urinalysis: No results for input(s): COLORURINE, LABSPEC, PHURINE, GLUCOSEU, HGBUR, BILIRUBINUR, KETONESUR, PROTEINUR, UROBILINOGEN, NITRITE, LEUKOCYTESUR in the last 72 hours.  Invalid input(s): APPERANCEUR    Imaging: [place] OR NERVE BLOCK-IMAGE ONLY [facility])  Result Date: 01/01/2021 There is no interpretation for this exam.  This order is for images obtained during a surgical procedure.  Please See "Surgeries" Tab for more information regarding the procedure.  DG MINI C-ARM IMAGE ONLY  Result Date: 01/01/2021 There is no interpretation for this exam.  This order is for images obtained during a surgical procedure.  Please See "Surgeries" Tab for more information regarding the procedure.     Medications:   . sodium chloride 0 mL (12/30/20 1523)  . heparin 2,100 Units/hr (01/02/21 [id number])   . acetaminophen  650 mg Oral Q8H  . aspirin EC  81 mg Oral Daily  . atorvastatin  40 mg Oral Daily  . Chlorhexidine Gluconate Cloth  6 each Topical Daily  . epoetin (EPOGEN/PROCRIT) injection  8,000 Units Intravenous Q T,Th,Sa-HD  . fentaNYL  50 mcg Intravenous Once  . folic acid  1 mg Oral Daily  . gabapentin  100 mg Oral QHS  . levothyroxine  300 mcg Oral QHS   . metoprolol tartrate  12.5 mg Oral Q6H  . midodrine  10 mg Oral TID WC  . pantoprazole  40 mg Oral QHS  . polyethylene glycol  17 g Oral Daily  . senna-docusate  2 tablet Oral QHS  . sevelamer carbonate  3,200 mg Oral TID WC  . sodium chloride flush  10-40 mL Intracatheter Q12H   albuterol, HYDROcodone-acetaminophen, morphine injection, ondansetron **OR** ondansetron (ZOFRAN) IV, ondansetron (ZOFRAN) IV, sevelamer carbonate, sodium chloride flush  Assessment/ Plan:  50 y.o. female with  end stage renal disease on hemodialysis, coronary artery disease, hypertension, peripheral vascular disease, hypothyroidismwas admitted on 12/20/2020 for Cellulitis of fourth toe of left foot [L03.032]   Principal Problem:   Cellulitis of fourth toe of left foot Active Problems:   ESRD on dialysis [facility])   Hypothyroidism   Atrial fibrillation ([place])   Coronary artery disease involving native coronary artery of native heart without angina pectoris   PAD (peripheral artery disease) ([place])  [facility] ([place]) MWF [facility] RIJ permcath  #. ESRD:  Changed to MWF, Covid-19 precautions discontinued Planning for dialysis today  #Covid 19 infection -tested positive on 12/14/20  #. Anemia of CKD -1 unit RBCs ordered to be given during HD Lab Results  Component Value Date   HGB 7.0 (L) 01/02/2021  Continue Epogen 8,000 with dailysis, consider transfusion if hgb continues to drop.  #. Secondary hyperparathyroidism of renal origin N 25.81  -continue sevelamer with meals    Component Value Date/Time   PTH 312.5 (H) 03/27/2014 0843   Lab Results  Component Value Date   PHOS 4.5 12/30/2020   We will continue monitoring bone mineral metabolism parameters  #. Peripheral vascular disease: with ischemic changes Patient underwent vascular procedure 01/01/21 for PAD Surgical dressing in place on left foot Vascular and podiatric teams managing care  #Afib with RVR Rate controlled  at this time Continues heparin infusion   LOS: [place] 2/7/202212:42 PM

## 2021-01-02 NOTE — Progress Notes (Signed)
[place] [facility] [place] NAME: Lori [name]    MR#:  [id number]  DATE OF BIRTH:  Oct 04, 1971  SUBJECTIVE:   Left foot dressing just changed, pt says that was painful. No acute events overnight. Tolerating diet, denies dyspnea or chest pain, no v/d.   REVIEW OF SYSTEMS:   Review of Systems  Constitutional: Positive for malaise/fatigue. Negative for chills, fever and weight loss.  HENT: Negative for ear discharge, ear pain and nosebleeds.   Eyes: Negative for blurred vision, pain and discharge.  Respiratory: Negative for sputum production, shortness of breath, wheezing and stridor.   Cardiovascular: Negative for chest pain, palpitations, orthopnea and PND.  Gastrointestinal: Negative for abdominal pain, diarrhea, nausea and vomiting.  Genitourinary: Negative for frequency and urgency.  Musculoskeletal: Negative for back pain and joint pain.  Neurological: Positive for weakness. Negative for sensory change, speech change and focal weakness.  Psychiatric/Behavioral: Negative for depression and hallucinations. The patient is not nervous/anxious.    Tolerating Diet:yes Tolerating PT:   DRUG ALLERGIES:   Allergies  Allergen Reactions  . Activase [Alteplase] Shortness Of Breath  . Bee Pollen Anaphylaxis  . Warfarin Sodium Nausea And Vomiting and Rash    VITALS:  Blood pressure (!) 116/94, pulse 94, temperature 97.9 F (36.6 C), temperature source Oral, resp. rate 19, height '5\' 3"'$  (1.6 m), weight 116.4 kg, SpO2 100 %.  PHYSICAL EXAMINATION:   Physical Exam  GENERAL:  50 y.o.-year-old patient lying in the bed with no acute distress. Appears chronically ill LUNGS: Normal breath sounds bilaterally, no wheezing, rales, rhonchi. No use of accessory muscles of respiration.  CARDIOVASCULAR: S1, S2 normal. No murmurs, rubs, or gallops. Tachycardia/a fib ABDOMEN: Soft, mild ttp throughout, obese. Bowel sounds present. EXTREMITIES: bandage left  foot NEUROLOGIC: moves all extremities well. Decreased sensation in feet.   PSYCHIATRIC:  patient is alert and oriented x 3.  SKIN: bronze color  LABORATORY PANEL:  CBC Recent Labs  Lab 01/02/21 0512  WBC 16.0*  HGB 7.0*  HCT 22.7*  PLT 320    Chemistries  Recent Labs  Lab 01/02/21 0512  NA 138  K 4.5  CL 99  CO2 25  GLUCOSE 100*  BUN 42*  CREATININE 7.33*  CALCIUM 8.4*   Cardiac Enzymes No results for input(s): TROPONINI in the last 168 hours. RADIOLOGY:  [place] OR NERVE BLOCK-IMAGE ONLY [facility])  Result Date: 01/01/2021 There is no interpretation for this exam.  This order is for images obtained during a surgical procedure.  Please See "Surgeries" Tab for more information regarding the procedure.   DG MINI C-ARM IMAGE ONLY  Result Date: 01/01/2021 There is no interpretation for this exam.  This order is for images obtained during a surgical procedure.  Please See "Surgeries" Tab for more information regarding the procedure.   ASSESSMENT AND PLAN:  50 y.o.femalewith medical history significant forCAD s/p stent to LAD in 2020, ESRD on HD MWF, hypertension, hypothyroidism, hyperparathyroidism, GERD who was initially admitted to [facility] on 12/14/2020 for a left foot wound.  She was started on antibiotics.  General surgery evaluated the patient and opined that she may ultimately need a left AKA.  Patient wanted to discuss with her vascular surgeon Dr. Delana Meyer before any surgery would be considered.  She was transferred to [facility] for further evaluation   Rapid a fib with RVR/hypotension/questionable septic shock in the setting of ESRB and left foot infection Patient was transferred to ICU on 30 January.  she received bolus of amiodarone, digoxin, IV Cardizem drip now off. Levophed discontinued, remains on midodrine. Cardiology following. HR wnl today - cont midodrine - lopressor 12.5 q6 as bp allows - avoiding digoxin given esrd; amio a consideration - brillinta on hold  given declining h/h - continue asa, heparin resumed, eventual transition to noac  Left foot wound infection with gangrene/chronic ischemia of the left lower extremity, now s/p ray amputation of left 4th and 5th toes On admission necrotic/ischemic appearance of left fourth and fifth toes. transferredfrom [name] for vascular surgery evaluation. Status post aortogram and left lower extremity angiogram with angioplasty of left peroneal artery/left SFA and popliteal arteries along with mechanical thrombectomy and stent placement of the left SFA and popliteal artery on 12/22/2020, with repeat Angiogram on 2/4 with angioplasty and multiple stents placed. Now s/p ray amputation of left 4th and 5th toes by podiatry on 2/6, immediate post-op course complicated by hypotension. - follow vascular surgery and podiatry recs - Antibiotics: cefepime/flagyl 1/26-1/27, zosyn 1/27-2/1, unasyn 2/1>2/6. Wound culture has grown Enterococcus faecalis and Proteus vulgaris.  - pt/ot consults, will likely need rehab  End-stage renal disease on hemodialysis Hyperkalemia Nephrology following.  Continue dialysis as per nephrology schedule t/t/s. Hyperkalemia resolved w/ dialysis - f/u nephro recs, now that off covid precautions may transition to mwf schedule  Anemia of chronic disease Folic acid deficiency H downtrending to 6.7 2/7, today 7. Folate low, b12 normal - brillinta on hold; asa continuing - one unit ordered yesterday but difficult access, unable to type and screen, will touch base w/ nephrology today regarding transfusion as may be best done during dialysis - epo w/ HD - start folic acid  Difficult IV access 2/2 esrd, obesity, pad.  - will touch base w/ nephrology about better long-term venous access  Bronze skin Iron overload Ferritin and tsat markedly elevated, likely 2/2 chronic esrd with blood and iron transfusions. Curbsided nephrology, this can be addressed as outpatient by avoiding future iron  administration  COVID-19 positive -Incidentally Covid + 12/14/2020. She has been asymptomatic. She states she has been fully vaccinated including booster dose. She received 3 days IV remdesivir. Currently no need for further treatment. Precautions discontinued  CAD: s/p multiple stents -Continue aspirin, Coreg and statin --follows with Dr Skipper Cliche [facility])  Hypertension: with relative hypotension -Monitor blood pressure.  Continue Coreg  Hypothyroidism: -Continue Synthroid  Constipation - have made senna/colace standing (has not been receiving prn) and added miralax qd    DVT prophylaxis: Heparin Code Status: Full Family Communication: None Disposition Plan: tbd pending further w/u of ischemic toe Status is: Inpatient  Remains inpatient appropriate because:Inpatient level of care appropriate due to severity of illness   Dispo: The patient is from: Home  Anticipated d/c is to: tbd  Anticipated d/c date is: > 3 days  Patient currently is not medically stable to d/c.              Difficult to place patient No  Consultants: Vascular surgery/nephrology/podiatry/ cardilogy  Procedures:  - aortogram and left lower extremity angiogram with angioplasty of left peroneal artery/left SFA and popliteal arteries along with mechanical thrombectomy and stent placement of the left SFA and popliteal artery on 12/22/2020.  - Repeat angiogram with angioplasty and stent placement 2/4. - ray amputation left 4th and 5th toes 2/6    Level of care: Med-Surg Status is: Inpatient    TOTAL TIME TAKING CARE OF THIS PATIENT: 70 min    Graysen Depaula B Ermie Glendenning M.D  [facility]

## 2021-01-02 NOTE — Progress Notes (Signed)
OT Cancellation Note  Patient Details Name: Lori [name] MRN: [id number] DOB: March 18, 1971   Cancelled Treatment:    Reason Eval/Treat Not Completed: Medical issues which prohibited therapy. Chart reviewed - pt noted to have Hb critically low at 7.0; contraindicated for exertional activity at this time. Will continue to follow and initiate services as pt medically appropriate to participate in therapy.   Fredirick Maudlin, OTR/L [place]

## 2021-01-02 NOTE — Consult Note (Signed)
[place] for Heparin  Indication: atrial fibrillation  Allergies  Allergen Reactions  . Activase [Alteplase] Shortness Of Breath  . Bee Pollen Anaphylaxis  . Warfarin Sodium Nausea And Vomiting and Rash    Patient Measurements: Height: '5\' 3"'$  (160 cm) Weight: 116.4 kg (256 lb 9.9 oz) IBW/kg (Calculated) : 52.4 Heparin Dosing Weight: 76.7 kg  Vital Signs: Temp: 97.6 F (36.4 C) (02/07 0542) Temp Source: Oral (02/07 0126) BP: 111/84 (02/07 0542) Pulse Rate: 95 (02/07 0542)  Labs: Recent Labs    12/30/20 1148 12/31/20 1050 12/31/20 2041 01/01/21 1238 01/02/21 0512  HGB 8.0* 7.6*  --  6.7* 7.0*  HCT 24.9* 24.6*  --  22.1* 22.7*  PLT 279 341  --  289 320  HEPARINUNFRC 0.30  --  0.24*  --  0.29*  CREATININE 7.16* 8.37*  --  6.54* 7.33*    Estimated Creatinine Clearance: 11.2 mL/min (A) (by C-G formula based on SCr of 7.33 mg/dL (H)).   Medical History: Past Medical History:  Diagnosis Date  . Anemia   . ASCVD (arteriosclerotic cardiovascular disease)    a. s/p prior stenting of LAD b. NSTEMI in 01/2018 requiring DES x3 to RCA 2/2 spiral dissection from ost->mid RCA but residual dzs in dRCA, LAD & OM2; c. 09/2019 Cath/PCI: LM nl, LAD 50 ISR, 65 p/m, 74m(2.5x15 Resolute Onyx DES), 90d, LCX 99d, OM2 99 (2.25x26 Resolute Onyx DES), RCA 40p/244mSR, 85d, RPDA 100ost, RPAV 50. EF 50-55%.  . Calciphylaxis   . Chronic abdominal wound infection   . COVID-19 virus infection 11/2020  . [place] ([facility]  . Diastolic dysfunction    a. 11/2020 Echo: EF 55-60%, mod conc LVh, gr2 DD, nl RV size/fxn. Sev dil LA. Triv MR. Mod-Sev mitral annular Ca2+. Mild-mod Ao sclerosis w/o stenosis.  . 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 peitonitis; difficult vascular access-->HD MWF.  . Marland Kitchenangrene of left foot ([facility]   a. 11/2020 L forefoot dry gangrene involving 4th and 5th MTP joints &  digits, & R 5th MTP joint.  . Marland KitchenERD (gastroesophageal reflux disease)   . Hashimoto thyroiditis   . Hyperparathyroidism   . Hypertension   . Hypothyroidism   . Medically noncompliant   . Morbid obesity ([facility]  . PAD (peripheral artery disease) ([facility]   a. 11/2020 PTA of L peroneal, PTA/thrombectomy, Viabahn stenting x 2 to the L SFA and popliteal arteries (6x2508m 6x150m43m . Persistent atrial fibrillation (HCC)[place] a. Noted during hospitalization 11/2020.    Assessment: Pharmacy consulted to start heparin for afib. No DOAC PTA noted.   2/02 - Hgb 7.5: brillinta held   Goal of Therapy:  Heparin level 0.3-0.7 units/ml Monitor platelets by anticoagulation protocol: Yes   Date Time HL  Rate/Comment 2/1 2351  0.3,   therapeutic 2/2 0837  <0.10,   subthera --2200 unit x1; f/b inc 1250 un/hr 2/2 1846  0.22,   subtherapeutic 2/3 0545 0.25,   subtherapeutic 2/3 1508  0.52,   therapeutic x 1; @ 1600 un/hr 2/3 2237  0.29  Subtherapeutic 1600un/hr 2/4 1148 0.30  LLN Therapeutic; '@1750'$  un/hr 2/4 Pt was started on tirofiban. Heparin was held. Restarted heparin after tirofiban is completed 2/5. Will restart heparin at 1850  2/5 2041 0.24  Subtherapeutic  2/7       0512    0.29  Subtherapeutic   Plan:  2/7:  Heparin drip was timed to expire on 2/7 @ 0500 but unclear why.   RN could not explain why it needed to be off except that it was set to time out @ 0500.   Dr Damita Dunnings ok'd restart drip but we may need to f/u plans for heparin ;  Pt had surgery on 2/6 but nothing scheduled for later on today.   2/7:   HL @ 0512 = 0.29  Will order Heparin 1150 units IV X 1 bolus and increase drip rate to 2100 units/hr.   Orene Desanctis, PharmD Clinical Pharmacist  01/02/2021,6:25 AM

## 2021-01-02 NOTE — Consult Note (Signed)
[place] for Heparin  Indication: atrial fibrillation  Allergies  Allergen Reactions  . Activase [Alteplase] Shortness Of Breath  . Bee Pollen Anaphylaxis  . Warfarin Sodium Nausea And Vomiting and Rash    Patient Measurements: Height: '5\' 3"'$  (160 cm) Weight: 116.4 kg (256 lb 9.9 oz) IBW/kg (Calculated) : 52.4 Heparin Dosing Weight: 76.7 kg  Vital Signs: Temp: 97.9 F (36.6 C) (02/07 1109) Temp Source: Oral (02/07 1109) BP: 116/94 (02/07 1109) Pulse Rate: 94 (02/07 1109)  Labs: Recent Labs    12/31/20 1050 12/31/20 2041 01/01/21 1238 01/02/21 0512 01/02/21 1341  HGB 7.6*  --  6.7* 7.0*  --   HCT 24.6*  --  22.1* 22.7*  --   PLT 341  --  289 320  --   HEPARINUNFRC  --  0.24*  --  0.29* 0.63  CREATININE 8.37*  --  6.54* 7.33*  --     Estimated Creatinine Clearance: 11.2 mL/min (A) (by C-G formula based on SCr of 7.33 mg/dL (H)).   Medical History: Past Medical History:  Diagnosis Date  . Anemia   . ASCVD (arteriosclerotic cardiovascular disease)    a. s/p prior stenting of LAD b. NSTEMI in 01/2018 requiring DES x3 to RCA 2/2 spiral dissection from ost->mid RCA but residual dzs in dRCA, LAD & OM2; c. 09/2019 Cath/PCI: LM nl, LAD 50 ISR, 65 p/m, 35m(2.5x15 Resolute Onyx DES), 90d, LCX 99d, OM2 99 (2.25x26 Resolute Onyx DES), RCA 40p/2102mSR, 85d, RPDA 100ost, RPAV 50. EF 50-55%.  . Calciphylaxis   . Chronic abdominal wound infection   . COVID-19 virus infection 11/2020  . [place] ([facility]  . Diastolic dysfunction    a. 11/2020 Echo: EF 55-60%, mod conc LVh, gr2 DD, nl RV size/fxn. Sev dil LA. Triv MR. Mod-Sev mitral annular Ca2+. Mild-mod Ao sclerosis w/o stenosis.  . Marland KitchenSRD (end stage renal disease) ([facility]   Due to membranous GN dialysis 09/1996; peritoneal dialysis --? peitonitis; difficult vascular access-->HD MWF.  . Marland Kitchenangrene of left foot ([facility]   a. 11/2020 L forefoot dry gangrene involving 4th and 5th MTP joints &  digits, & R 5th MTP joint.  . Marland KitchenERD (gastroesophageal reflux disease)   . Hashimoto thyroiditis   . Hyperparathyroidism   . Hypertension   . Hypothyroidism   . Medically noncompliant   . Morbid obesity ([facility] [place]  . PAD (peripheral artery disease) ([facility] [place]   a. 11/2020 PTA of L peroneal, PTA/thrombectomy, Viabahn stenting x 2 to the L SFA and popliteal arteries (6x25042m 6x150m62m . Persistent atrial fibrillation (HCC)[place] a. Noted during hospitalization 11/2020.    Assessment: Pharmacy consulted to start heparin for afib. No DOAC PTA noted.   2/02 - Hgb 7.5: brillinta held   Goal of Therapy:  Heparin level 0.3-0.7 units/ml Monitor platelets by anticoagulation protocol: Yes   Date Time HL  Rate/Comment 2/1 2351  0.3,   therapeutic 2/2 0837  <0.10,   subthera --2200 unit x1; f/b inc 1250 un/hr 2/2 1846  0.22,   subtherapeutic 2/3 0545 0.25,   subtherapeutic 2/3 1508  0.52,   therapeutic x 1; @ 1600 un/hr 2/3 2237  0.29  Subtherapeutic 1600un/hr 2/4 1148 0.30  LLN Therapeutic; '@1750'$  un/hr 2/4 Pt was started on tirofiban. Heparin was held. Restarted heparin after tirofiban is completed 2/5. Will restart heparin at 1850  2/5 2041 0.24  Subtherapeutic  2/7  0512    0.29                 Subtherapeutic  Plan:  2/7 @ 1341 HL 0.63.  Continue 2100 units/hr. Will recheck confirmatory heparin level in 6 hours. CBC with AM labs.   Pernell Dupre, PharmD, BCPS Clinical Pharmacist 01/02/2021 2:53 PM

## 2021-01-02 NOTE — Progress Notes (Signed)
1 Day Post-Op   Subjective/Chief Complaint: Patient seen.  States she is still having some significant pain in her left foot.   Objective: Vital signs in last 24 hours: Temp:  [97.3 F (36.3 C)-98.1 F (36.7 C)] 97.9 F (36.6 C) (02/07 1109) Pulse Rate:  [60-132] 94 (02/07 1109) Resp:  [15-19] 19 (02/07 1109) BP: (70-120)/(52-94) 116/94 (02/07 1109) SpO2:  [87 %-100 %] 100 % (02/07 1109) Last BM Date: 12/30/20  Intake/Output from previous day: 02/06 0701 - 02/07 0700 In: 300 [I.V.:300] Out: 5 [Blood:5] Intake/Output this shift: Total I/O In: 240 [P.O.:240] Out: -   Heavy bleeding noted on the bandaging on the left foot.  Upon removal the incision appears to be well coapted with skin edges viable.  Does not appear to be specifically cellulitic.  Distal aspect of the toes still appear viable as well.      Lab Results:  Recent Labs    01/01/21 1238 01/02/21 0512  WBC 17.7* 16.0*  HGB 6.7* 7.0*  HCT 22.1* 22.7*  PLT 289 320   BMET Recent Labs    01/01/21 1238 01/02/21 0512  NA 138 138  K 4.2 4.5  CL 101 99  CO2 23 25  GLUCOSE 87 100*  BUN 39* 42*  CREATININE 6.54* 7.33*  CALCIUM 8.3* 8.4*   PT/INR No results for input(s): LABPROT, INR in the last 72 hours. ABG No results for input(s): PHART, HCO3 in the last 72 hours.  Invalid input(s): PCO2, PO2  Studies/Results: [place] OR NERVE BLOCK-IMAGE ONLY [facility])  Result Date: 01/01/2021 There is no interpretation for this exam.  This order is for images obtained during a surgical procedure.  Please See "Surgeries" Tab for more information regarding the procedure.   DG MINI C-ARM IMAGE ONLY  Result Date: 01/01/2021 There is no interpretation for this exam.  This order is for images obtained during a surgical procedure.  Please See "Surgeries" Tab for more information regarding the procedure.    Anti-infectives: Anti-infectives (From admission, onward)   Start     Dose/Rate Route Frequency Ordered Stop    12/30/20 0945  ceFAZolin (ANCEF) IVPB 1 g/50 mL premix       Note to Pharmacy: To be given in specials   1 g 100 mL/hr over 30 Minutes Intravenous  Once 12/30/20 0846 12/30/20 1335   12/27/20 1800  Ampicillin-Sulbactam (UNASYN) 3 g in sodium chloride 0.9 % 100 mL IVPB        3 g 200 mL/hr over 30 Minutes Intravenous Every 12 hours 12/27/20 1326 12/30/20 0603   12/22/20 1400  piperacillin-tazobactam (ZOSYN) IVPB 2.25 g  Status:  Discontinued        2.25 g 100 mL/hr over 30 Minutes Intravenous Every 8 hours 12/22/20 1314 12/27/20 1326   12/22/20 1330  ceFAZolin (ANCEF) IVPB 1 g/50 mL premix       Note to Pharmacy: To be given in specials   1 g 100 mL/hr over 30 Minutes Intravenous 60 min pre-op 12/22/20 0052 12/22/20 1607   12/22/20 1257  ceFAZolin (ANCEF) 1-4 GM/50ML-% IVPB       Note to Pharmacy: Corlis Hove   : cabinet override      12/22/20 1257 12/22/20 1610   12/22/20 1200  vancomycin (VANCOCIN) IVPB 1000 mg/200 mL premix  Status:  Discontinued        1,000 mg 200 mL/hr over 60 Minutes Intravenous Every T-Th-Sa (Hemodialysis) 12/22/20 1128 12/22/20 1314   12/22/20 0145  ceFAZolin (ANCEF) IVPB 1  g/50 mL premix  Status:  Discontinued       Note to Pharmacy: To be given in specials   1 g 100 mL/hr over 30 Minutes Intravenous  Once 12/22/20 0048 12/22/20 0050   12/21/20 1200  ceFEPIme (MAXIPIME) 2 g in sodium chloride 0.9 % 100 mL IVPB  Status:  Discontinued        2 g 200 mL/hr over 30 Minutes Intravenous Every M-W-F (Hemodialysis) 12/20/20 2038 12/22/20 1314   12/21/20 1200  vancomycin (VANCOCIN) IVPB 1000 mg/200 mL premix  Status:  Discontinued        1,000 mg 200 mL/hr over 60 Minutes Intravenous Every M-W-F (Hemodialysis) 12/20/20 2038 12/22/20 1128   12/20/20 2200  metroNIDAZOLE (FLAGYL) tablet 500 mg  Status:  Discontinued        500 mg Oral Every 8 hours 12/20/20 2038 12/22/20 1314      Assessment/Plan: s/p Procedure(s): AMPUTATION RAY-4th & 5th (Left) Assessment:  Stable status post fourth and fifth ray resection.   Plan: Betadine applied to the incision area followed by bulky sterile bandage.  Overall the foot appears stable at this point.  Continue to follow closely postoperatively.  LOS: 13 days    Durward Fortes 01/02/2021

## 2021-01-02 NOTE — Care Management Important Message (Signed)
Important Message  Patient Details  Name: Lori [name] MRN: [id number] Date of Birth: November 11, 1971   Medicare Important Message Given:  Yes     Juliann Pulse A Persephanie Laatsch 01/02/2021, 11:46 AM

## 2021-01-02 NOTE — Progress Notes (Signed)
PT Cancellation Note  Patient Details Name: Lori [name] MRN: [id number] DOB: Jun 30, 1971   Cancelled Treatment:    Reason Eval/Treat Not Completed: Patient not medically ready.  PT consult received.  Chart reviewed.  Pt's Hgb noted to be 7.0 this morning.  Per PT guidelines for low Hgb, will hold PT at this time and re-attempt PT evaluation at a later date/time as medically appropriate.  Leitha Bleak, PT 01/02/21, 8:38 AM

## 2021-01-03 DIAGNOSIS — I4819 Other persistent atrial fibrillation: Secondary | ICD-10-CM

## 2021-01-03 LAB — BASIC METABOLIC PANEL
Anion gap: 13 (ref 5–15)
BUN: 27 mg/dL — ABNORMAL HIGH (ref 6–20)
CO2: 26 mmol/L (ref 22–32)
Calcium: 8.2 mg/dL — ABNORMAL LOW (ref 8.9–10.3)
Chloride: 97 mmol/L — ABNORMAL LOW (ref 98–111)
Creatinine, Ser: 5.33 mg/dL — ABNORMAL HIGH (ref 0.44–1.00)
GFR, Estimated: 9 mL/min — ABNORMAL LOW (ref >60)
Glucose, Bld: 87 mg/dL (ref 70–99)
Potassium: 4.1 mmol/L (ref 3.5–5.1)
Sodium: 136 mmol/L (ref 135–145)

## 2021-01-03 LAB — CBC
HCT: 24.4 % — ABNORMAL LOW (ref 36.0–46.0)
Hemoglobin: 7.7 g/dL — ABNORMAL LOW (ref 12.0–15.0)
MCH: 31.4 pg (ref 26.0–34.0)
MCHC: 31.6 g/dL (ref 30.0–36.0)
MCV: 99.6 fL (ref 80.0–100.0)
Platelets: 279 10*3/uL (ref 150–400)
RBC: 2.45 MIL/uL — ABNORMAL LOW (ref 3.87–5.11)
RDW: 17.8 % — ABNORMAL HIGH (ref 11.5–15.5)
WBC: 15.4 10*3/uL — ABNORMAL HIGH (ref 4.0–10.5)
nRBC: 0.9 % — ABNORMAL HIGH (ref 0.0–0.2)

## 2021-01-03 LAB — TYPE AND SCREEN
ABO/RH(D): A POS
Antibody Screen: NEGATIVE
Unit division: 0

## 2021-01-03 LAB — HEPARIN LEVEL (UNFRACTIONATED): Heparin Unfractionated: 0.18 IU/mL — ABNORMAL LOW (ref 0.30–0.70)

## 2021-01-03 LAB — BPAM RBC
Blood Product Expiration Date: 202202162359
ISSUE DATE / TIME: 202202071548
Unit Type and Rh: 600

## 2021-01-03 MED ORDER — HEPARIN BOLUS VIA INFUSION
1150.0000 [IU] | Freq: Once | INTRAVENOUS | Status: AC
  Administered 2021-01-03: 1150 [IU] via INTRAVENOUS
  Filled 2021-01-03: qty 1150

## 2021-01-03 MED ORDER — HEPARIN (PORCINE) 25000 UT/250ML-% IV SOLN
2100.0000 [IU]/h | INTRAVENOUS | Status: DC
  Administered 2021-01-03 – 2021-01-04 (×3): 2100 [IU]/h via INTRAVENOUS
  Filled 2021-01-03 (×2): qty 250

## 2021-01-03 MED ORDER — METOPROLOL TARTRATE 25 MG PO TABS
25.0000 mg | ORAL_TABLET | Freq: Two times a day (BID) | ORAL | Status: DC
  Administered 2021-01-03 – 2021-01-10 (×14): 25 mg via ORAL
  Filled 2021-01-03 (×13): qty 1

## 2021-01-03 NOTE — Progress Notes (Signed)
F/u left 4th and 5th ray amputation. Wound is stable. Flap is stable without necrosis. Dressing changed. Will monitor while in house.

## 2021-01-03 NOTE — Progress Notes (Signed)
Spoke with [place] to verify whether patient will be returning to normal schedule. Kieth Brightly Control and instrumentation engineer) stated that patient will be out of Isolation days tomorrow 2/9. Upon discharge patient will resume normal schedule TTS 12:15pm.

## 2021-01-03 NOTE — Progress Notes (Signed)
[facility], [place] 01/03/21  Subjective:   Patient says she just finished working with OT Patient laying in the bed She says she had a good session and got her hair washed.  Alert and able to answer questions. Denies shortness of breath Able to eat meals without nausea   Objective:  Vital signs in last 24 hours:  Temp:  [96.3 F (35.7 C)-98.2 F (36.8 C)] 96.3 F (35.7 C) (02/08 1155) Pulse Rate:  [58-110] 58 (02/08 1155) Resp:  [12-21] 16 (02/08 1155) BP: (79-137)/(51-106) 113/71 (02/08 1155) SpO2:  [93 %-100 %] 95 % (02/08 0618)  Weight change:  Filed Weights   12/29/20 2030 12/30/20 0359 12/31/20 0433  Weight: 113.8 kg 113.5 kg 116.4 kg    Intake/Output:    Intake/Output Summary (Last 24 hours) at 01/03/2021 1213 Last data filed at 01/03/2021 0955 Gross per 24 hour  Intake 580 ml  Output 0 ml  Net 580 ml     Physical Exam: General: Resting in bed, NAD  HEENT  normocephalic, atraumatic  Pulm/lungs Clear bilaterally  CVS/Heart  S1-S2 present, irregular rhythm  Abdomen:  soft, nondistended  Extremities: No peripheral edema  Neurologic:  Alert, able to answer simple questions   Skin:  No acute lesions or rashes  Access: RIJ permcath       Basic Metabolic Panel:  Recent Labs  Lab 12/30/20 1148 12/31/20 1050 01/01/21 1238 01/02/21 0512 01/03/21 0438  NA 138 136 138 138 136  K 4.5 5.1 4.2 4.5 4.1  CL 97* 96* 101 99 97*  CO2 '23 22 23 25 26  '$ GLUCOSE 94 98 87 100* 87  BUN 39* 51* 39* 42* 27*  CREATININE 7.16* 8.37* 6.54* 7.33* 5.33*  CALCIUM 9.0 9.0 8.3* 8.4* 8.2*  PHOS 4.5  --   --   --   --      CBC: Recent Labs  Lab 12/30/20 1148 12/31/20 1050 01/01/21 1238 01/02/21 0512 01/03/21 0438  WBC 12.9* 17.8* 17.7* 16.0* 15.4*  HGB 8.0* 7.6* 6.7* 7.0* 7.7*  HCT 24.9* 24.6* 22.1* 22.7* 24.4*  MCV 98.8 100.0 103.3* 102.3* 99.6  PLT 279 341 289 320 279      Lab Results  Component Value Date   HEPBSAG NON REACTIVE  12/25/2020      Microbiology:  Recent Results (from the past 240 hour(s))  Aerobic/Anaerobic Culture (surgical/deep wound)     Status: None (Preliminary result)   Collection Time: 01/01/21  9:24 AM   Specimen: PATH Other; Tissue  Result Value Ref Range Status   Specimen Description   Final    TOE LEFT Performed at [facility], [address], [place], [place] [address]    Special Requests   Final    NONE Performed at [facility], [address] [place], [place] [address]    Gram Stain   Final    NO WBC SEEN NO ORGANISMS SEEN Performed at [facility], [place] [address] [place], [place] [address]    Culture   Final    CULTURE REINCUBATED FOR BETTER GROWTH NO ANAEROBES ISOLATED; CULTURE IN PROGRESS FOR 5 DAYS    Report Status PENDING  Incomplete    Coagulation Studies: No results for input(s): LABPROT, INR in the last 72 hours.  Urinalysis: No results for input(s): COLORURINE, LABSPEC, PHURINE, GLUCOSEU, HGBUR, BILIRUBINUR, KETONESUR, PROTEINUR, UROBILINOGEN, NITRITE, LEUKOCYTESUR in the last 72 hours.  Invalid input(s): APPERANCEUR    Imaging: No results found.   Medications:   .  sodium chloride 0 mL (12/30/20 1523)   . acetaminophen  650 mg Oral Q8H  . aspirin EC  81 mg Oral Daily  . atorvastatin  40 mg Oral Daily  . Chlorhexidine Gluconate Cloth  6 each Topical Daily  . epoetin (EPOGEN/PROCRIT) injection  8,000 Units Intravenous Q T,Th,Sa-HD  . fentaNYL  50 mcg Intravenous Once  . folic acid  1 mg Oral Daily  . gabapentin  100 mg Oral QHS  . levothyroxine  300 mcg Oral QHS  . metoprolol tartrate  12.5 mg Oral Q6H  . midodrine  10 mg Oral TID WC  . pantoprazole  40 mg Oral QHS  . polyethylene glycol  17 g Oral Daily  . senna-docusate  2 tablet Oral QHS  . sevelamer carbonate  3,200 mg Oral TID WC  . sodium chloride flush  10-40 mL Intracatheter Q12H   albuterol, HYDROcodone-acetaminophen, morphine injection, ondansetron  **OR** ondansetron (ZOFRAN) IV, ondansetron (ZOFRAN) IV, sevelamer carbonate, sodium chloride flush  Assessment/ Plan:  50 y.o. female with  end stage renal disease on hemodialysis, coronary artery disease, hypertension, peripheral vascular disease, hypothyroidismwas admitted on 12/20/2020 for Cellulitis of fourth toe of left foot [L03.032]   Principal Problem:   Cellulitis of fourth toe of left foot Active Problems:   ESRD on dialysis [facility])   Hypothyroidism   Atrial fibrillation ([place])   Coronary artery disease involving native coronary artery of native heart without angina pectoris   PAD (peripheral artery disease) ([place])  [facility] ([place]) MWF [facility] RIJ permcath  #. ESRD:  Changed to MWF Successful dialysis treatment yesterday Will plan for next treatment tomorrow  #Covid 19 infection -tested positive on 12/14/20  #. Anemia of CKD -Given 1 unit of RBCs during HD, yesterday.  -Hbg slightly improved Lab Results  Component Value Date   HGB 7.7 (L) 01/03/2021  Continue Epogen 8,000 with dailysis, consider transfusion if hgb continues to drop.  #. Secondary hyperparathyroidism of renal origin N 25.81  -continue sevelamer with meals    Component Value Date/Time   PTH 312.5 (H) 03/27/2014 0843   Lab Results  Component Value Date   PHOS 4.5 12/30/2020   We will continue monitoring bone mineral metabolism parameters  #. Peripheral vascular disease: with ischemic changes Patient underwent vascular procedure 01/01/21 for PAD Surgical dressing in place on left foot, changed daily Vascular and podiatric teams managing care  #Afib with RVR Rate controlled at this time Heparin gtt discontinued   LOS: [place] 2/8/202212:13 PM

## 2021-01-03 NOTE — Plan of Care (Signed)

## 2021-01-03 NOTE — Progress Notes (Addendum)
Progress Note  Patient Name: Lori [name] Date of Encounter: 01/03/2021  [facility] Cardiologist: Dr. Terrence Dupont  Subjective   No chest pain.  No racing heart rate or palpitations. No dizziness.  DOE/SOB reported. She does not use oxygen at home and reports shortness of breath when taken off her nasal cannula oxygen.  This is exacerbated by any exertion/ambulation, such as when PT worked with her earlier this morning. No CP or other sx with exertion.  She reports a productive cough with white sputum. Orthopnea reported, though difficult to assess for her as she is uncomfortable when laying flat.  She reports receiving BRBPR yesterday during HD.  Inpatient Medications    Scheduled Meds: . acetaminophen  650 mg Oral Q8H  . aspirin EC  81 mg Oral Daily  . atorvastatin  40 mg Oral Daily  . Chlorhexidine Gluconate Cloth  6 each Topical Daily  . epoetin (EPOGEN/PROCRIT) injection  8,000 Units Intravenous Q T,Th,Sa-HD  . fentaNYL  50 mcg Intravenous Once  . folic acid  1 mg Oral Daily  . gabapentin  100 mg Oral QHS  . levothyroxine  300 mcg Oral QHS  . metoprolol tartrate  12.5 mg Oral Q6H  . midodrine  10 mg Oral TID WC  . pantoprazole  40 mg Oral QHS  . polyethylene glycol  17 g Oral Daily  . senna-docusate  2 tablet Oral QHS  . sevelamer carbonate  3,200 mg Oral TID WC  . sodium chloride flush  10-40 mL Intracatheter Q12H   Continuous Infusions: . sodium chloride 0 mL (12/30/20 1523)   PRN Meds: albuterol, HYDROcodone-acetaminophen, morphine injection, ondansetron **OR** ondansetron (ZOFRAN) IV, ondansetron (ZOFRAN) IV, sevelamer carbonate, sodium chloride flush   Vital Signs    Vitals:   01/02/21 2156 01/03/21 0051 01/03/21 0618 01/03/21 1155  BP: 114/89 99/70 110/61 113/71  Pulse: 95 93 97 (!) 58  Resp: '18 18 16 16  '$ Temp: 98.2 F (36.8 C) 98 F (36.7 C) 98.1 F (36.7 C) (!) 96.3 F (35.7 C)  TempSrc:      SpO2: 99% 93% 95%   Weight:      Height:         Intake/Output Summary (Last 24 hours) at 01/03/2021 1241 Last data filed at 01/03/2021 0955 Gross per 24 hour  Intake 580 ml  Output 0 ml  Net 580 ml   Last 3 Weights 12/31/2020 12/30/2020 12/29/2020  Weight (lbs) 256 lb 9.9 oz 250 lb 3.6 oz 250 lb 14.1 oz  Weight (kg) 116.4 kg 113.5 kg 113.8 kg      Telemetry    Atrial fibrillation with ventricular rates 70s to 90s, rare elevation to low 100s (~10AM today on 2/8 and pt reports that this is the same time she was with PT)- Personally Reviewed  ECG    No new tracings- Personally Reviewed  Physical Exam   GEN: No acute distress.  Eating lunch.  Nasal cannula in place at approximately 2 L.  Right hemodialysis catheter port. Neck:  JVD difficult to assess due to body habitus. Cardiac:  IRIR with controlled ventricular rate, no murmurs, rubs, or gallops.  Respiratory: Clear to auscultation bilaterally. GI: Slightly firm and distended, reportedly tender 2/2 heparin injections MS:  Dependent edema with lower extremity exam limited by ACE wrapping and given recent stenting and LLE amputation with tenderness reported; No deformity. Neuro:  Nonfocal  Psych: Normal affect   Labs    High Sensitivity Troponin:  No results for input(s):  TROPONINIHS in the last 720 hours.    Chemistry Recent Labs  Lab 12/30/20 1148 12/31/20 1050 01/01/21 1238 01/02/21 0512 01/03/21 0438  NA 138   < > 138 138 136  K 4.5   < > 4.2 4.5 4.1  CL 97*   < > 101 99 97*  CO2 23   < > '23 25 26  '$ GLUCOSE 94   < > 87 100* 87  BUN 39*   < > 39* 42* 27*  CREATININE 7.16*   < > 6.54* 7.33* 5.33*  CALCIUM 9.0   < > 8.3* 8.4* 8.2*  ALBUMIN 2.9*  --   --   --   --   GFRNONAA 6*   < > 7* 6* 9*  ANIONGAP 18*   < > '14 14 13   '$ < > = values in this interval not displayed.     Hematology Recent Labs  Lab 01/01/21 1238 01/02/21 0512 01/03/21 0438  WBC 17.7* 16.0* 15.4*  RBC 2.14* 2.22* 2.45*  HGB 6.7* 7.0* 7.7*  HCT 22.1* 22.7* 24.4*  MCV 103.3* 102.3* 99.6   MCH 31.3 31.5 31.4  MCHC 30.3 30.8 31.6  RDW 16.2* 16.4* 17.8*  PLT 289 320 279    BNPNo results for input(s): BNP, PROBNP in the last 168 hours.   DDimer No results for input(s): DDIMER in the last 168 hours.   Radiology    No results found.  Cardiac Studies   Echo 1. Left ventricular ejection fraction, by estimation, is 55 to 60%. The  left ventricle has normal function. Left ventricular endocardial border  not optimally defined to evaluate regional wall motion. There is moderate  left ventricular hypertrophy. Left  ventricular diastolic parameters are consistent with Grade II diastolic  dysfunction (pseudonormalization).  3. Left atrial size was severely dilated.   Patient Profile     50 y.o. female with history of CAD (prior PCI to LAD, RCA, OM), abnormal TSH, anemia, PAD s/p recent L SFA proximal popliteal stenting 12/22/20, ESRD on HD, MWF, L foot infection s/p amputation,  and seen for Afib with RVR.  Assessment & Plan    PAF, newly diagnosed --Remains in A. fib since 12/25/2020.  No racing HR/palpitations but does note SOB. Anticoagulation initiated ~12/27/20.  Rate control has been limited by hypotension.  Given she is not therapeutically anticoagulated, amiodarone has been avoided given risk of pharmacologic conversion.    Continue current metoprolol 12.5 mg every 6 hours.    Daily BMET. Maintain electrolytes at goal.   TSH 78.253.  Recommend further management per IM.  Consolidate to Lopressor BID dosing and, ideally, oral Toprol before discharge.   Recommend indefinite anticoagulation given CHA2DS2VASc score of at least 4 (HFpEF, HTN, CAD/PAD, female) and to reduce the risk of thromboembolic event.   Daily CBC.  Continue current heparin drip with plan to transition to Eliquis 5 mg twice daily if no further invasive procedures this admission.  She may receive BRBPR again today; therefore therefore, we may defer initiation of Eliquis until after that time.    If still in atrial fibrillation 1 month after initiation of anticoagulation, plan for DCCV as an OP at that time.  No current plan for TEE/DCCV this admission plan, given ventricular rates are currently well controlled.  Abnormal TSH Hypothyroidism --TSH 78.253.  Continue synthroid. Further management and treatment per IM / endocrinology/PCP.  Anemia --S/p PRBC 2/7 with EMR indicating possible repeat transfusion today 2/8.  Recommend continue IV heparin for now /  defer initiation of Eliquis given need for repeat transfusion.  Goal hemoglobin above 8.0.  Consider that this is likely contributing to her shortness of breath.  Baseline hemoglobin 10.0-12.0.  Daily CBC. Monitor volume status with repeat transfusions.   CAD s/p prior PCI --No chest pain.  Previous echo EF 55 to 60%.  Prior PCI's, most recently  09/2019.  No plan for invasive ischemic work-up this admission.  Continue medical management.    Statin added this admission for secondary prevention.  Repeat OP lipid and liver function in 6 to 8 weeks, LDL goal below 70.  Continue BB, ideally consolidated to Toprol prior to discharge.  At discharge, IV heparin to be transitioned to Eliquis.   Will need to discuss antiplatelet therapy with vascular surgery. Eliquis can be continued with clopidogrel or ticagrelor per vascular recommendations with discontinuation of aspirin at that time.    Hypotension --Most recent BP 113/71. --Continue midodrine.   --BB / GDMT limited by hypotension as above.  ESRD on HD --Hemodialysis MWF per nephrology.  Okay to continue midodrine to support BP.  PAD, gangrene, bilateral foot necrosis --Still notes mild pain related to intervention.  S/p PTCA left peroneal, thrombectomy, left SFA and left popliteal followed by PTCA 12/22/2020.  S/p PTCA right anterior tibial, right SFA, right pop BTL, and bilateral common iliacs 12/30/2020.  S/p amputation of left fourth/fifth ray 2/6 .   Anticoagulation/antiplatelet strategy to be discussed with vascular surgery.  Continue statin as above.  COVID-19 infection --1/19 positive test. Reports asymptomatic.  Per IM.   For questions or updates, please contact [place] Please consult [email or web address] for contact info under        Signed, Arvil Chaco, PA-C  01/03/2021, 12:41 PM

## 2021-01-03 NOTE — NC FL2 (Signed)
[place] LEVEL OF CARE SCREENING TOOL     IDENTIFICATION  Patient Name: Lori [name] Birthdate: 07-10-1971 Sex: female Admission Date (Current Location): 12/20/2020  [place] and [place] Number:  Engineering geologist and Address:  [facility], [address], [place], [place] [address]      Provider Number: [id number]  Attending Physician Name and Address:  Gwynne Edinger, MD  Relative Name and Phone Number:  [place], [place] (Daughter)   [phone] (Home Phone)    Current Level of Care: Hospital Recommended Level of Care: [place] Prior Approval Number:    Date Approved/Denied:   PASRR Number: [id number]  Discharge Plan:      Current Diagnoses: Patient Active Problem List   Diagnosis Date Noted  . Coronary artery disease involving native coronary artery of native heart without angina pectoris   . PAD (peripheral artery disease) ([place])   . Cellulitis of fourth toe of left foot   . Infected wound 12/14/2020  . Chest pain 10/13/2019  . ACS (acute coronary syndrome) ([place]) 10/13/2019  . Atrial fibrillation ([place]) 10/13/2019  . ASCVD (arteriosclerotic cardiovascular disease)   . Anaphylactic reaction due to adverse effect of correct drug or medicament properly administered, initial encounter 08/12/2019  . Superficial injury of right index finger with infection 12/25/2018  . Lymphedema 04/21/2018  . Chronic venous insufficiency 04/21/2018  . Hyperlipidemia, unspecified 03/03/2018  . Non-STEMI (non-ST elevated myocardial infarction) ([place]) 02/09/2018  . Other abnormalities of gait and mobility 01/04/2017  . Very low level of personal hygiene 01/03/2017  . Other disorders resulting from impaired renal tubular function 10/01/2016  . Hyperparathyroidism ([place]) 06/12/2016  . HTN (hypertension) 06/12/2016  . Nonrheumatic aortic valve stenosis 06/04/2016  . Glomerulonephritis 05/24/2016  . History of asthma 05/24/2016  .  History of TIA (transient ischemic attack) 05/24/2016  . Vocal cord dysfunction 05/24/2016  . BMI 40.0-44.9, adult ([place]) 05/24/2016  . Other pruritus 05/21/2016  . Other fluid overload 12/08/2015  . Shortness of breath 07/27/2014  . Mild protein-calorie malnutrition ([place]) 04/20/2014  . Hypocalcemia 03/27/2014  . ESRD on dialysis ([place]) 03/27/2014  . Hypothyroidism 03/27/2014  . Chronic abdominal wound infection 03/27/2014  . Calciphylaxis 03/27/2014  . End stage renal disease ([place]) 03/27/2014  . Iron deficiency anemia, unspecified 01/16/2014  . Coagulation defect, unspecified ([place]) 10/28/2013  . Diarrhea, unspecified 10/28/2013  . Other specified symptoms and signs involving the circulatory and respiratory systems 10/28/2013  . Hypertensive chronic kidney disease with stage 5 chronic kidney disease or end stage renal disease ([place]) 12/21/2012  . Cardiovascular disease 09/22/2010  . Atherosclerotic heart disease of native coronary artery without angina pectoris 09/22/2010  . SYNCOPE 07/16/2010  . POSTURAL LIGHTHEADEDNESS 07/16/2010  . OBESITY 07/14/2010  . GASTROESOPHAGEAL REFLUX DISEASE 07/14/2010  . PALPITATIONS 07/14/2010  . HYPERPARATHYROIDISM, HX OF 07/14/2010  . Gastro-esophageal reflux disease without esophagitis 07/14/2010  . Personal history of other endocrine, nutritional and metabolic disease [id number]  . Secondary hyperparathyroidism of renal origin ([place]) 09/26/2009  . 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 Allergy status to other drugs, medicaments and biological substances 03/18/2004  . Patient's noncompliance with other medical treatment and regimen 03/18/2004    Orientation RESPIRATION BLADDER Height & Weight     Self,Time,Situation,Place  O2 Continent Weight: 256 lb 9.9 oz (116.4 kg) Height:  '5\' 3"'$  (160 cm)  BEHAVIORAL SYMPTOMS/MOOD NEUROLOGICAL BOWEL NUTRITION STATUS      Incontinent Diet (heart diet,  thin liquids)  AMBULATORY STATUS COMMUNICATION OF NEEDS Skin   Limited Assist Verbally  (surgical incision L foot, skin tear L elbow, venous stasis ulcer L foot)                       Personal Care Assistance Level of Assistance  Bathing,Feeding,Dressing Bathing Assistance: Limited assistance Feeding assistance: Independent Dressing Assistance: Limited assistance     Functional Limitations Info             SPECIAL CARE FACTORS FREQUENCY  PT (By licensed PT),OT (By licensed OT)     PT Frequency: 5 x/week OT Frequency: 5 x/week            Contractures      Additional Factors Info  Code Status,Allergies Code Status Info: full code Allergies Info: activase, bee pollen, warfarin sodium           Current Medications (01/03/2021):  This is the current hospital active medication list Current Facility-Administered Medications  Medication Dose Route Frequency Provider Last Rate Last Admin  . 0.9 %  sodium chloride infusion  250 mL Intravenous Continuous Samara Deist, DPM 0 mL/hr at 12/30/20 1523 New Bag at 01/01/21 0854  . acetaminophen (TYLENOL) tablet 650 mg  650 mg Oral Q8H Samara Deist, DPM   650 mg at 01/03/21 1330  . albuterol (VENTOLIN HFA) 108 (90 Base) MCG/ACT inhaler 1-2 puff  1-2 puff Inhalation Q6H PRN Samara Deist, DPM      . aspirin EC tablet 81 mg  81 mg Oral Daily Samara Deist, DPM   81 mg at 01/03/21 0846  . atorvastatin (LIPITOR) tablet 40 mg  40 mg Oral Daily Samara Deist, DPM   40 mg at 01/03/21 0846  . Chlorhexidine Gluconate Cloth 2 % PADS 6 each  6 each Topical Daily Samara Deist, DPM   6 each at 01/02/21 2015  . epoetin alfa (EPOGEN) injection 8,000 Units  8,000 Units Intravenous Q T,Th,Sa-HD Samara Deist, DPM   8,000 Units at 12/31/20 1157  . fentaNYL (SUBLIMAZE) injection 50 mcg  50 mcg Intravenous Once Piscitello, Precious Haws, MD      . folic acid (FOLVITE) tablet 1 mg  1 mg Oral Daily Wouk, Ailene Rud, MD   1 mg at 01/03/21 0846  .  gabapentin (NEURONTIN) capsule 100 mg  100 mg Oral QHS Samara Deist, DPM   100 mg at 01/02/21 2014  . HYDROcodone-acetaminophen (NORCO/VICODIN) 5-325 MG per tablet 1 tablet  1 tablet Oral Q6H PRN Samara Deist, DPM   1 tablet at 01/03/21 0845  . levothyroxine (SYNTHROID) tablet 300 mcg  300 mcg Oral QHS Samara Deist, DPM   300 mcg at 01/02/21 2014  . metoprolol tartrate (LOPRESSOR) tablet 12.5 mg  12.5 mg Oral Q6H Wouk, Ailene Rud, MD   12.5 mg at 01/03/21 1329  . midodrine (PROAMATINE) tablet 10 mg  10 mg Oral TID WC Samara Deist, DPM   10 mg at 01/03/21 1329  . morphine 2 MG/ML injection 2 mg  2 mg Intravenous Q2H PRN Samara Deist, DPM   2 mg at 01/03/21 0215  . ondansetron (ZOFRAN) tablet 4 mg  4 mg Oral Q6H PRN Samara Deist, DPM       Or  . ondansetron [facility]) injection 4 mg  4 mg Intravenous Q6H PRN Samara Deist, DPM      . ondansetron [facility]) injection 4 mg  4 mg Intravenous Q6H PRN Samara Deist, DPM      . pantoprazole (PROTONIX) EC tablet  40 mg  40 mg Oral QHS Samara Deist, DPM   40 mg at 01/02/21 2014  . polyethylene glycol (MIRALAX / GLYCOLAX) packet 17 g  17 g Oral Daily Samara Deist, DPM   17 g at 01/02/21 0827  . senna-docusate (Senokot-S) tablet 2 tablet  2 tablet Oral QHS Samara Deist, DPM   2 tablet at 01/02/21 2014  . sevelamer carbonate (RENVELA) tablet 1,600 mg  1,600 mg Oral PRN Samara Deist, DPM      . sevelamer carbonate (RENVELA) tablet 3,200 mg  3,200 mg Oral TID WC Samara Deist, DPM   1,600 mg at 01/03/21 1330  . sodium chloride flush (NS) 0.9 % injection 10-40 mL  10-40 mL Intracatheter Q12H Samara Deist, DPM   10 mL at 01/03/21 1000  . sodium chloride flush (NS) 0.9 % injection 10-40 mL  10-40 mL Intracatheter PRN Samara Deist, DPM         Discharge Medications: Please see discharge summary for a list of discharge medications.  Relevant Imaging Results:  Relevant Lab Results:   Additional Information SS #: [id number]  [place], LCSW

## 2021-01-03 NOTE — TOC Initial Note (Addendum)
Transition of Care [facility]) - Initial/Assessment Note    Patient Details  Name: Lori [name] MRN: [id number] Date of Birth: 1971-09-05  Transition of Care [facility]) CM/SW Contact:    Magnus Ivan, LCSW Phone Number: 01/03/2021, 10:10 AM  Clinical Narrative:       Patient moved to 1A yesterday. CSW spoke with patient for Readmission Screening. Patient lives with her husband. Patient typically drives herself to appointments, but reported she also uses medical transportation or family takes her as needed. Patient reported she was just set up with PCP Dr. Allyn Kenner in [place]. Pharmacy is [facility]. Patient has a RW, cane, and w/c at home. Patient had [place] services in the past, could not recall agency. No SNF history. Patient reported her daughter will pick her up at DC. Patient agreeable to DME being ordered if recommended after PT/OT evals.           Spoke to Dialysis Coordinator Estill Bamberg. Patient goes to dialysis at [facility] MWF 12:15. Tomorrow is patient's last day of isolation for dialysis so she can return to her normal clinic at time of DC.  2:05- Per MD, patient needs SNF. Patient agreeable to SNF, waiting on PT recommendations. Patient prefers somewhere in the [place] area if possible. Patient reported she has her COVID vaccines.  Expected Discharge Plan: Home/Self Care Barriers to Discharge: Continued Medical Work up   Patient Goals and CMS Choice Patient states their goals for this hospitalization and ongoing recovery are:: to return home with spouse CMS Medicare.gov Compare Post Acute Care list provided to:: Patient Choice offered to / list presented to : Patient  Expected Discharge Plan and Services Expected Discharge Plan: Home/Self Care       Living arrangements for the past 2 months: Single Family Home                                      Prior Living Arrangements/Services Living arrangements for the past 2 months: Single Family Home Lives  with:: Spouse Patient language and need for interpreter reviewed:: Yes Do you feel safe going back to the place where you live?: Yes      Need for Family Participation in Patient Care: Yes (Comment) Care giver support system in place?: Yes (comment) Current home services: DME Criminal Activity/Legal Involvement Pertinent to Current Situation/Hospitalization: No - Comment as needed  Activities of Daily Living Home Assistive Devices/Equipment: Cane (specify quad or straight) ADL Screening (condition at time of admission) Patient's cognitive ability adequate to safely complete daily activities?: Yes Is the patient deaf or have difficulty hearing?: No Does the patient have difficulty seeing, even when wearing glasses/contacts?: No Does the patient have difficulty concentrating, remembering, or making decisions?: No Patient able to express need for assistance with ADLs?: Yes Does the patient have difficulty dressing or bathing?: No Independently performs ADLs?: Yes (appropriate for developmental age) Does the patient have difficulty walking or climbing stairs?: Yes Weakness of Legs: Both Weakness of Arms/Hands: Both  Permission Sought/Granted Permission sought to share information with : Facility Retail banker granted to share information with : Yes, Verbal Permission Granted     Permission granted to share info w AGENCY: DME, HH as needed        Emotional Assessment       Orientation: : Oriented to Self,Oriented to Place,Oriented to  Time,Oriented to Situation Alcohol / Substance Use: Not Applicable Psych  Involvement: No (comment)  Admission diagnosis:  Cellulitis of fourth toe of left foot [L03.032] Patient Active Problem List   Diagnosis Date Noted  . Coronary artery disease involving native coronary artery of native heart without angina pectoris   . PAD (peripheral artery disease) ([place])   . Cellulitis of fourth toe of left foot   .  Infected wound 12/14/2020  . Chest pain 10/13/2019  . ACS (acute coronary syndrome) ([place]) 10/13/2019  . Atrial fibrillation ([place]) 10/13/2019  . ASCVD (arteriosclerotic cardiovascular disease)   . Anaphylactic reaction due to adverse effect of correct drug or medicament properly administered, initial encounter 08/12/2019  . Superficial injury of right index finger with infection 12/25/2018  . Lymphedema 04/21/2018  . Chronic venous insufficiency 04/21/2018  . Hyperlipidemia, unspecified 03/03/2018  . Non-STEMI (non-ST elevated myocardial infarction) ([place]) 02/09/2018  . Other abnormalities of gait and mobility 01/04/2017  . Very low level of personal hygiene 01/03/2017  . Other disorders resulting from impaired renal tubular function 10/01/2016  . Hyperparathyroidism ([place]) 06/12/2016  . HTN (hypertension) 06/12/2016  . Nonrheumatic aortic valve stenosis 06/04/2016  . Glomerulonephritis 05/24/2016  . History of asthma 05/24/2016  . History of TIA (transient ischemic attack) 05/24/2016  . Vocal cord dysfunction 05/24/2016  . BMI 40.0-44.9, adult ([place]) 05/24/2016  . Other pruritus 05/21/2016  . Other fluid overload 12/08/2015  . Shortness of breath 07/27/2014  . Mild protein-calorie malnutrition ([place]) 04/20/2014  . Hypocalcemia 03/27/2014  . ESRD on dialysis ([place]) 03/27/2014  . Hypothyroidism 03/27/2014  . Chronic abdominal wound infection 03/27/2014  . Calciphylaxis 03/27/2014  . End stage renal disease ([place]) 03/27/2014  . Iron deficiency anemia, unspecified 01/16/2014  . Coagulation defect, unspecified ([place]) 10/28/2013  . Diarrhea, unspecified 10/28/2013  . Other specified symptoms and signs involving the circulatory and respiratory systems 10/28/2013  . Hypertensive chronic kidney disease with stage 5 chronic kidney disease or end stage renal disease ([place]) 12/21/2012  . Cardiovascular disease 09/22/2010  . Atherosclerotic heart disease of native coronary artery without angina  pectoris 09/22/2010  . SYNCOPE 07/16/2010  . POSTURAL LIGHTHEADEDNESS 07/16/2010  . OBESITY 07/14/2010  . GASTROESOPHAGEAL REFLUX DISEASE 07/14/2010  . PALPITATIONS 07/14/2010  . HYPERPARATHYROIDISM, HX OF 07/14/2010  . Gastro-esophageal reflux disease without esophagitis 07/14/2010  . Personal history of other endocrine, nutritional and metabolic disease A999333  . Secondary hyperparathyroidism of renal origin ([place]) 09/26/2009  . 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 Allergy status to other drugs, medicaments and biological substances 03/18/2004  . Patient's noncompliance with other medical treatment and regimen 03/18/2004   PCP:  Patient, No Pcp Per Pharmacy:   [facility], [address] [place] [address] Phone: [phone] Fax: [phone]     Social Determinants of Health (SDOH) Interventions    Readmission Risk Interventions Readmission Risk Prevention Plan 01/03/2021 12/14/2020  Medication Screening - Complete  Transportation Screening Complete Complete  PCP or Specialist Appt within 3-5 Days Complete -  HRI or Home Care Consult Complete -  Social Work Consult for [place] Planning/Counseling Complete -  Palliative Care Screening Not Applicable -  Medication Review Press photographer) Complete -  Some recent data might be hidden

## 2021-01-03 NOTE — Evaluation (Signed)
Occupational Therapy Evaluation Patient Details Name: Lori [name] MRN: [id number] DOB: 1971-05-04 Today's Date: 01/03/2021    History of Present Illness 50 y.o. female with medical history significant for CAD s/p stent to LAD in 2020, ESRD on HD MWF, hypertension, hypothyroidism, hyperparathyroidism, and GERD, s/p ray amputation of left 4th and 5th toes   Clinical Impression   Pt seen for OT evaluation this date. Upon arrival to room, pt awake and seated upright in bed. Prior to admission, pt reports being independent in all ADLs and functional mobility within the home with [facility], living in a 1-story home with spouse. Pt reports receiving assistance from husband for IADLs and using a wheelchair for community mobility. Pt currently requires SUPERVISION/SET-UP for most seated grooming ADLs, requiring increased assistance for ADLs requiring shoulder flexion >90 degrees (i.e., hair washing and brushing) d/t fatigue. Pt was able to perform supine<>sit transfer with MOD A and HOB elevated. Unable to assess OOB functional mobility d/t surgical boots unavailable at time of eval; RN made aware and boots ordered. Pt would benefit from additional skilled OT services to maximize return to PLOF and minimize risk of future falls, injury, caregiver burden, and readmission. Upon discharge, recommend SNF; may progress to home with home health pending OOB functional mobility.    Follow Up Recommendations  SNF    Equipment Recommendations  3 in 1 bedside commode       Precautions / Restrictions Precautions Precautions: Fall Restrictions Weight Bearing Restrictions: Yes LLE Weight Bearing: Partial weight bearing LLE Partial Weight Bearing Percentage or Pounds: heel weight bearing      Mobility Bed Mobility Overal bed mobility: Needs Assistance Bed Mobility: Supine to Sit;Sit to Supine     Supine to sit: Mod assist Sit to supine: Mod assist   General bed mobility comments: Required assistance for LE  management    Transfers                 General transfer comment: Unable to assess d/t surgical boots unavailable at time of eval; RN made aware    Balance Overall balance assessment: Needs assistance Sitting-balance support: Single extremity supported;Feet supported Sitting balance-Leahy Scale: Fair Sitting balance - Comments: Sitting EOB to complete seated grooming                                   ADL either performed or assessed with clinical judgement   ADL Overall ADL's : Needs assistance/impaired     Grooming: Wash/dry hands;Wash/dry face;Oral care;Supervision/safety;Set up;Brushing hair;Minimal assistance;Sitting Grooming Details (indicate cue type and reason): Sitting at EOB. Required MOD A for washing/drying hair with shower cap; pt appearing fatigued and labeling the activty as "moderate activity" Upper Body Bathing: Supervision/ safety;Set up;Sitting Upper Body Bathing Details (indicate cue type and reason): Supervision for line management     Upper Body Dressing : Supervision/safety;Set up;Sitting Upper Body Dressing Details (indicate cue type and reason): to don hospital gown                   General ADL Comments: With increased time and effort, pt SUPERVISION/SET-UP for most seated grooming ADLs. Required increased assistance for ADLs requiring shoulder flexion >90 degrees (i.e., hair washing and brushing) d/t fatigue. Unable to assess OOB functional mobility d/t surgical boots unavailable at time of eval; RN made aware     Vision Baseline Vision/History: Wears glasses Wears Glasses: At all times  Pertinent Vitals/Pain Pain Assessment: 0-10 Pain Score: 3  Pain Location: L foot Pain Descriptors / Indicators: Discomfort Pain Intervention(s): Limited activity within patient's tolerance;Monitored during session;Repositioned     Hand Dominance Right   Extremity/Trunk Assessment Upper Extremity Assessment Upper  Extremity Assessment: Generalized weakness   Lower Extremity Assessment Lower Extremity Assessment: Defer to PT evaluation       Communication Communication Communication: No difficulties   Cognition Arousal/Alertness: Awake/alert Behavior During Therapy: WFL for tasks assessed/performed Overall Cognitive Status: Within Functional Limits for tasks assessed                                 General Comments: A&Ox4. Pleasant and agreeable throughout              [place] expects to be discharged to:: Private residence Living Arrangements: Spouse/significant other Available Help at Discharge: Family Type of Home: House Home Access: Stairs to enter Technical brewer of Steps: 2 Entrance Stairs-Rails: Right Home Layout: One level     Bathroom Shower/Tub: Occupational psychologist: Standard Bathroom Accessibility: Yes   Home Equipment: Programme researcher, broadcasting/film/video - 2 wheels;Cane - single point;Hospital bed   Additional Comments: Has access to hospital bed at home but did not use prior to admission      Prior Functioning/Environment Level of Independence: Independent with assistive device(s)        Comments: Pt uses cane in the home and wheelchair when out in the community. Husband completed meal prep, housekeeping tasks, and the finances. Pt indpendent with dressing and showering        OT Problem List: Pain;Decreased activity tolerance;Impaired balance (sitting and/or standing);Decreased strength;Decreased range of motion      OT Treatment/Interventions: Self-care/ADL training;Therapeutic exercise;Therapeutic activities;Energy conservation;DME and/or AE instruction;Manual therapy;Modalities;Balance training;Patient/family education    OT Goals(Current goals can be found in the care plan section) Acute Rehab OT Goals Patient Stated Goal: To return home with damily to assist OT Goal Formulation: With patient Time For Goal  Achievement: 01/17/21 Potential to Achieve Goals: Good  OT Frequency: Min 2X/week    AM-PAC OT "6 Clicks" Daily Activity     Outcome Measure Help from another person eating meals?: None Help from another person taking care of personal grooming?: A Little Help from another person toileting, which includes using toliet, bedpan, or urinal?: A Lot Help from another person bathing (including washing, rinsing, drying)?: A Lot Help from another person to put on and taking off regular upper body clothing?: A Little Help from another person to put on and taking off regular lower body clothing?: A Lot 6 Click Score: 16   End of Session Equipment Utilized During Treatment: Oxygen Nurse Communication: Mobility status;Other (comment) (order for surgical boots)  Activity Tolerance: Patient tolerated treatment well Patient left: in bed;with call bell/phone within reach;with bed alarm set  OT Visit Diagnosis: History of falling (Z91.81);Pain Pain - Right/Left: Left Pain - part of body: Ankle and joints of foot                Time: [id number] OT Time Calculation (min): 55 min Charges:  OT General Charges $OT Visit: 1 Visit OT Evaluation $OT Eval Moderate Complexity: 1 Mod OT Treatments $Self Care/Home Management : 38-52 mins  Fredirick Maudlin, OTR/L [place]

## 2021-01-03 NOTE — Consult Note (Signed)
[place] for Heparin  Indication: atrial fibrillation  Allergies  Allergen Reactions  . Activase [Alteplase] Shortness Of Breath  . Bee Pollen Anaphylaxis  . Warfarin Sodium Nausea And Vomiting and Rash    Patient Measurements: Height: '5\' 3"'$  (160 cm) Weight: 116.4 kg (256 lb 9.9 oz) IBW/kg (Calculated) : 52.4 Heparin Dosing Weight: 76.7 kg  Vital Signs: Temp: 96.3 F (35.7 C) (02/08 1155) BP: 113/71 (02/08 1155) Pulse Rate: 58 (02/08 1155)  Labs: Recent Labs    01/01/21 1238 01/02/21 0512 01/02/21 1341 01/02/21 2017 01/03/21 0438  HGB 6.7* 7.0*  --   --  7.7*  HCT 22.1* 22.7*  --   --  24.4*  PLT 289 320  --   --  279  HEPARINUNFRC  --  0.29* 0.63 0.80*  --   CREATININE 6.54* 7.33*  --   --  5.33*    Estimated Creatinine Clearance: 15.4 mL/min (A) (by C-G formula based on SCr of 5.33 mg/dL (H)).   Medical History: Past Medical History:  Diagnosis Date  . Anemia   . ASCVD (arteriosclerotic cardiovascular disease)    a. s/p prior stenting of LAD b. NSTEMI in 01/2018 requiring DES x3 to RCA 2/2 spiral dissection from ost->mid RCA but residual dzs in dRCA, LAD & OM2; c. 09/2019 Cath/PCI: LM nl, LAD 50 ISR, 65 p/m, 40m(2.5x15 Resolute Onyx DES), 90d, LCX 99d, OM2 99 (2.25x26 Resolute Onyx DES), RCA 40p/264mSR, 85d, RPDA 100ost, RPAV 50. EF 50-55%.  . Calciphylaxis   . Chronic abdominal wound infection   . COVID-19 virus infection 11/2020  . [place] ([facility]  . Diastolic dysfunction    a. 11/2020 Echo: EF 55-60%, mod conc LVh, gr2 DD, nl RV size/fxn. Sev dil LA. Triv MR. Mod-Sev mitral annular Ca2+. Mild-mod Ao sclerosis w/o stenosis.  . Marland KitchenSRD (end stage renal disease) ([facility] [place]   Due to membranous GN dialysis 09/1996; peritoneal dialysis --? peitonitis; difficult vascular access-->HD MWF.  . Marland Kitchenangrene of left foot ([facility]   a. 11/2020 L forefoot dry gangrene involving 4th and 5th MTP joints & digits, & R 5th MTP joint.  . Marland KitchenGERD (gastroesophageal reflux disease)   . Hashimoto thyroiditis   . Hyperparathyroidism   . Hypertension   . Hypothyroidism   . Medically noncompliant   . Morbid obesity (HCDISH  . PAD (peripheral artery disease) ([facility]   a. 11/2020 PTA of L peroneal, PTA/thrombectomy, Viabahn stenting x 2 to the L SFA and popliteal arteries (6x25016m 6x150m6m . Persistent atrial fibrillation (HCC)[place] a. Noted during hospitalization 11/2020.    Assessment: Pharmacy consulted to start heparin for afib. No DOAC PTA noted.   2/02 - Hgb 7.5: brillinta held   Goal of Therapy:  Heparin level 0.3-0.7 units/ml Monitor platelets by anticoagulation protocol: Yes   Date Time HL  Rate/Comment 2/1 2351  0.3,   therapeutic 2/2 0837  <0.10,   subthera --2200 unit x1; f/b inc 1250 un/hr 2/2 1846  0.22,   subtherapeutic 2/3 0545 0.25,   subtherapeutic 2/3 1508  0.52,   therapeutic x 1; @ 1600 un/hr 2/3 2237  0.29  Subtherapeutic 1600un/hr 2/4 1148 0.30  LLN Therapeutic; '@1750'$  un/hr 2/4 Pt was started on tirofiban. Heparin was held. Restarted heparin after tirofiban is completed 2/5. Will restart heparin at 1850  2/5 2041 0.24  Subtherapeutic  2/7       0512    0.29  Subtherapeutic 2/7       1341    0.63.      Continue 2100 units/hr. * Drip stopped 2/8 at [place]:  2/8 1535 Will restart Heparin drip @  2100 units/hr with bolus of 1150 units x 1. Will check heparin level in 6 hours. CBC with AM labs.   Noralee Space, PharmD Clinical Pharmacist 01/03/2021 3:28 PM

## 2021-01-03 NOTE — Evaluation (Signed)
Physical Therapy Evaluation Patient Details Name: Lori [name] MRN: [id number] DOB: 1970-12-08 Today's Date: 01/03/2021   History of Present Illness  Pt is a 50 y.o. female initially admitted to [facility] 1/19 s/p fall during car transfer after finishing dialysis; also c/o chronic wound on L foot (present about 1 month).  Pt transferred to [facility] 12/20/20 for vascular surgery evaluation of L foot vascular disease.  Pt admitted with L foot wound infection with chronic ischemia of L LE, ESRD on HD MWF, COVID-19 (+) 12/14/20, and paroxysmal a-fib.  1/27 s/p balloon angioplasty, thrombectomy and 2 stents placed to SFA and L popliteal.  1/29 surgical case aborted d/t pt unstable after anesthesia.  1/30 pt noted with a-fib with RVR and transferred to ICU after rapid response (pt in a-fib with RVR and hypotension).  2/4 s/p angio with angioplasty and stent placement.  2/6 s/p amputation L 4th and 5th ray (pt bradycardic and hypotensive end of surgery but responded to treatment).  PMH includes anemia, ASCVD, calciphylaxis, chronic abdominal wound infection, ESRD, Hashimoto thyroiditis, htn, morbid obesity, partial thyroidectomy, CAD s/p stent to LAD.  Clinical Impression  Prior to hospital admission, pt was ambulatory with [facility] household distances and used w/c in community; lives with spouse in 1 level home with 2 STE R railing.  In therapy session this afternoon, pt required assist to donn/doff B post op shoes in bed beginning/end of session.  Currently pt is 1-2 assist with bed mobility and unable to stand with 1 person assist and bed height significantly elevated (and use of RW).  Pt also had difficulty positioning L LE d/t L knee pain (pt tending to want to externally rotate L LE in sitting requiring assist of therapist for heel WB'ing precautions).  Pt appeared to fatigue with activity; generalized weakness noted.  Pt would benefit from skilled PT to address noted impairments and functional limitations (see  below for any additional details).  Upon hospital discharge, pt would benefit from SNF.    Follow Up Recommendations SNF    Equipment Recommendations  Rolling walker with 5" wheels;3in1 (PT);Wheelchair (measurements PT);Wheelchair cushion (measurements PT);Hospital bed (hoyer lift)    Recommendations for Other Services OT consult     Precautions / Restrictions Precautions Precautions: Fall Precaution Comments: B post op shoes Restrictions Weight Bearing Restrictions: Yes Other Position/Activity Restrictions: Heel WB'ing only to L foot per secure messaging with MD Cleda Mccreedy 01/03/21; R 5th MTP joint dry gangrene      Mobility  Bed Mobility Overal bed mobility: Needs Assistance Bed Mobility: Supine to Sit;Sit to Supine;Rolling Rolling: Mod assist (logrolling L and R in bed for clean-up d/t bowel incontinence)   Supine to sit: Mod assist;Max assist;HOB elevated (assist for trunk and L LE) Sit to supine: +2 for physical assistance   General bed mobility comments: vc's for technique semi-supine to sitting edge of bed; 2 assist required to lay down in bed in order to reposition pt safely in bed (pt unable to lateral scoot along bed to assist with repositioning)    Transfers Overall transfer level: Needs assistance Equipment used: Rolling walker (2 wheeled) Transfers: Sit to/from Stand Sit to Stand: Total assist         General transfer comment: unable to stand with 1 assist, bed height elevated, and max cueing  Ambulation/Gait             General Gait Details: unable to stand to assess  Stairs  Wheelchair Mobility    Modified Rankin (Stroke Patients Only)       Balance Overall balance assessment: Needs assistance Sitting-balance support: Single extremity supported;Feet supported Sitting balance-Leahy Scale: Poor Sitting balance - Comments: pt requiring at least single UE support for static sitting balance                                      Pertinent Vitals/Pain Pain Assessment: 0-10 Pain Score: 2  Pain Location: L foot Pain Descriptors / Indicators: Sore Pain Intervention(s): Limited activity within patient's tolerance;Monitored during session;Repositioned  Vitals (HR and O2 on 2 L via nasal cannula) stable and WFL throughout treatment session.    Home Living Family/patient expects to be discharged to:: Private residence Living Arrangements: Spouse/significant other Available Help at Discharge: Family Type of Home: House Home Access: Stairs to enter Entrance Stairs-Rails: Right Entrance Stairs-Number of Steps: 2 Home Layout: One level Home Equipment: [place] - 2 wheels;Cane - single point;Hospital bed;Wheelchair - manual Additional Comments: Has access to hospital bed (did not use prior to admission)    Prior Function Level of Independence: Independent with assistive device(s)         Comments: Ambulatory with [place] household distances but uses w/c in community.     Hand Dominance   Dominant Hand: Right    Extremity/Trunk Assessment   Upper Extremity Assessment Upper Extremity Assessment: Defer to OT evaluation;Generalized weakness    Lower Extremity Assessment Lower Extremity Assessment:  (R LE at least 3/5 hip flexion, knee flexion/extension, and DF/PF) LLE Deficits / Details: 3-/5 hip flexion, knee flexion/extension, and DF/PF; L knee positioned in flexed position (20-30 degrees) for comfort (does not like to extend or bed L knee a lot) LLE: Unable to fully assess due to pain    Cervical / Trunk Assessment Cervical / Trunk Assessment: Kyphotic  Communication   Communication: No difficulties  Cognition Arousal/Alertness: Awake/alert Behavior During Therapy: WFL for tasks assessed/performed Overall Cognitive Status: Within Functional Limits for tasks assessed                                        General Comments  Pt agreeable to PT session.    Exercises  Bed mobility  and transfer training   Assessment/Plan    PT Assessment Patient needs continued PT services  PT Problem List Decreased strength;Decreased range of motion;Decreased activity tolerance;Decreased balance;Decreased mobility;Decreased knowledge of use of DME;Decreased knowledge of precautions;Pain;Decreased skin integrity       PT Treatment Interventions DME instruction;Gait training;Stair training;Functional mobility training;Therapeutic activities;Therapeutic exercise;Balance training;Patient/family education;Wheelchair mobility training    PT Goals (Current goals can be found in the Care Plan section)  Acute Rehab PT Goals Patient Stated Goal: to return home PT Goal Formulation: With patient Time For Goal Achievement: 01/17/21 Potential to Achieve Goals: Fair    Frequency 7X/week   Barriers to discharge Decreased caregiver support      Co-evaluation               AM-PAC PT "6 Clicks" Mobility  Outcome Measure Help needed turning from your back to your side while in a flat bed without using bedrails?: A Lot Help needed moving from lying on your back to sitting on the side of a flat bed without using bedrails?: A Lot Help needed moving to and from  a bed to a chair (including a wheelchair)?: Total Help needed standing up from a chair using your arms (e.g., wheelchair or bedside chair)?: Total Help needed to walk in hospital room?: Total Help needed climbing 3-5 steps with a railing? : Total 6 Click Score: 8    End of Session Equipment Utilized During Treatment: Oxygen (2 L via nasal cannula) Activity Tolerance: Patient limited by fatigue;Patient limited by pain Patient left: in bed;with call bell/phone within reach;with bed alarm set;Other (comment) (L LE elevated on pillow with heel floating) Nurse Communication: Mobility status;Precautions;Weight bearing status (via white board) PT Visit Diagnosis: Other abnormalities of gait and mobility (R26.89);Muscle weakness  (generalized) (M62.81);History of falling (Z91.81);Difficulty in walking, not elsewhere classified (R26.2);Pain Pain - Right/Left: Left Pain - part of body: Ankle and joints of foot    Time: 1453-1545 PT Time Calculation (min) (ACUTE ONLY): 52 min   Charges:   PT Evaluation $PT Eval Low Complexity: 1 Low PT Treatments $Therapeutic Activity: 38-52 mins       Josslynn Mentzer, PT 01/03/21, 4:20 PM

## 2021-01-03 NOTE — Progress Notes (Signed)
[facility] [place] [facility] NAME: Lori [name]    MR#:  [id number]  DATE OF BIRTH:  04/10/1971  SUBJECTIVE:   No acute events overnight. Left foot pain improved. No cough or sob. Tolerating diet.  REVIEW OF SYSTEMS:   Review of Systems  Constitutional: Positive for malaise/fatigue. Negative for chills, fever and weight loss.  HENT: Negative for ear discharge, ear pain and nosebleeds.   Eyes: Negative for blurred vision, pain and discharge.  Respiratory: Negative for sputum production, shortness of breath, wheezing and stridor.   Cardiovascular: Negative for chest pain, palpitations, orthopnea and PND.  Gastrointestinal: Negative for abdominal pain, diarrhea, nausea and vomiting.  Genitourinary: Negative for frequency and urgency.  Musculoskeletal: Negative for back pain and joint pain.  Neurological: Positive for weakness. Negative for sensory change, speech change and focal weakness.  Psychiatric/Behavioral: Negative for depression and hallucinations. The patient is not nervous/anxious.    Tolerating Diet:yes Tolerating PT:   DRUG ALLERGIES:   Allergies  Allergen Reactions  . Activase [Alteplase] Shortness Of Breath  . Bee Pollen Anaphylaxis  . Warfarin Sodium Nausea And Vomiting and Rash    VITALS:  Blood pressure 113/71, pulse (!) 58, temperature (!) 96.3 F (35.7 C), resp. rate 16, height '5\' 3"'$  (1.6 m), weight 116.4 kg, SpO2 95 %.  PHYSICAL EXAMINATION:   Physical Exam  GENERAL:  50 y.o.-year-old patient lying in the bed with no acute distress. Appears chronically ill LUNGS: Normal breath sounds bilaterally, no wheezing, rales, rhonchi. No use of accessory muscles of respiration.  CARDIOVASCULAR: S1, S2 normal. No murmurs, rubs, or gallops. Tachycardia/a fib ABDOMEN: Soft, mild ttp throughout, obese. Bowel sounds present. EXTREMITIES: bandage left foot NEUROLOGIC: moves all extremities well. Decreased sensation in feet.    PSYCHIATRIC:  patient is alert and oriented x 3.  SKIN: bronze color  LABORATORY PANEL:  CBC Recent Labs  Lab 01/03/21 0438  WBC 15.4*  HGB 7.7*  HCT 24.4*  PLT 279    Chemistries  Recent Labs  Lab 01/03/21 0438  NA 136  K 4.1  CL 97*  CO2 26  GLUCOSE 87  BUN 27*  CREATININE 5.33*  CALCIUM 8.2*   Cardiac Enzymes No results for input(s): TROPONINI in the last 168 hours. RADIOLOGY:  No results found. ASSESSMENT AND PLAN:  50 y.o.femalewith medical history significant forCAD s/p stent to LAD in 2020, ESRD on HD MWF, hypertension, hypothyroidism, hyperparathyroidism, GERD who was initially admitted to [facility] on 12/14/2020 for a left foot wound.  She was started on antibiotics.  General surgery evaluated the patient and opined that she may ultimately need a left AKA.  Patient wanted to discuss with her vascular surgeon Dr. Delana Meyer before any surgery would be considered.  She was transferred to [facility] for further evaluation   Rapid a fib with RVR/hypotension/questionable septic shock in the setting of ESRB and left foot infection Patient was transferred to ICU on 30 January. she received bolus of amiodarone, digoxin, IV Cardizem drip now off. Levophed discontinued, remains on midodrine. Cardiology following. HR wnl today - cont midodrine - lopressor 12.5 q6 as bp allows - avoiding digoxin given esrd; amio a consideration - brillinta on hold given declining h/h - continue asa, heparin resumed, eventual transition to noac assuming hgt stabilizes - outpt consideration of cardioversion  Left foot wound infection with gangrene/chronic ischemia of the left lower extremity, now s/p ray amputation of left 4th and 5th toes On admission necrotic/ischemic appearance  of left fourth and fifth toes. transferredfrom [name] for vascular surgery evaluation. Status post aortogram and left lower extremity angiogram with angioplasty of left peroneal artery/left SFA and popliteal  arteries along with mechanical thrombectomy and stent placement of the left SFA and popliteal artery on 12/22/2020, with repeat Angiogram on 2/4 with angioplasty and multiple stents placed. Now s/p ray amputation of left 4th and 5th toes by podiatry on 2/6, immediate post-op course complicated by hypotension. - follow vascular surgery and podiatry recs - Antibiotics finished. received cefepime/flagyl 1/26-1/27, zosyn 1/27-2/1, unasyn 2/1>2/6. Wound culture has grown Enterococcus faecalis and Proteus vulgaris.  - pt/ot advising snf, pt hesitant but agreeable, will ask care manager to proceed w/ that  End-stage renal disease on hemodialysis Hyperkalemia Nephrology following. Initially treated with covid t/t/s schedule, now that covid precautions discontinued has transitioned to mwf schedule. Tolerated dialysis yesterday.  Hyperkalemia resolved w/ dialysis - f/u nephro recs, cont mwf dialysis  Anemia of chronic disease Folic acid deficiency H has slowly downtrended, was 7 yesterday, received 1 u during dialysis, is 7.8 today. No melena or other bleeding. Folate low, b12 normal - brillinta on hold; asa continuing, heparin as above - epo w/ HD - start folic acid - monitor for now  Difficult IV access 2/2 esrd, obesity, pad.  - have touched base w/ nephrology about better long-term venous access, they are considering options  Bronze skin Iron overload Ferritin and tsat markedly elevated, likely 2/2 chronic esrd with blood and iron transfusions. Curbsided nephrology, this can be addressed as outpatient by avoiding future iron administration  COVID-19 positive -Incidentally Covid + 12/14/2020. She has been asymptomatic. She states she has been fully vaccinated including booster dose. She received 3 days IV remdesivir. Currently no need for further treatment. Precautions discontinued  CAD: s/p multiple stents -Continue aspirin, Coreg and statin --follows with Dr Skipper Cliche  [facility])  Hypertension: with relative hypotension -Monitor blood pressure.  Continue Coreg  Hypothyroidism: TSH markedly elevated, pt says was not taking synthroid for several weeks prior to arrival because pharmacy didn't have her meds - home synthroid resumed, will need repeat TSH in several weeks  Constipation - have made senna/colace standing (has not been receiving prn) and added miralax qd    DVT prophylaxis: Heparin Code Status: Full Family Communication: None Disposition Plan: SNF Status is: Inpatient  Remains inpatient appropriate because:Inpatient level of care appropriate due to severity of illness   Dispo: The patient is from: Home  Anticipated d/c is to: SNF  Anticipated d/c date is: 2-4 days  Patient currently is not medically stable to d/c.              Difficult to place patient No  Consultants: Vascular surgery/nephrology/podiatry/ cardilogy  Procedures:  - aortogram and left lower extremity angiogram with angioplasty of left peroneal artery/left SFA and popliteal arteries along with mechanical thrombectomy and stent placement of the left SFA and popliteal artery on 12/22/2020.  - Repeat angiogram with angioplasty and stent placement 2/4. - ray amputation left 4th and 5th toes 2/6    Level of care: Med-Surg Status is: Inpatient    TOTAL TIME TAKING CARE OF THIS PATIENT: 49 min    Desma Maxim M.D    [facility]

## 2021-01-04 LAB — BASIC METABOLIC PANEL
Anion gap: 15 (ref 5–15)
BUN: 36 mg/dL — ABNORMAL HIGH (ref 6–20)
CO2: 23 mmol/L (ref 22–32)
Calcium: 8.1 mg/dL — ABNORMAL LOW (ref 8.9–10.3)
Chloride: 95 mmol/L — ABNORMAL LOW (ref 98–111)
Creatinine, Ser: 6.8 mg/dL — ABNORMAL HIGH (ref 0.44–1.00)
GFR, Estimated: 7 mL/min — ABNORMAL LOW (ref >60)
Glucose, Bld: 78 mg/dL (ref 70–99)
Potassium: 4.6 mmol/L (ref 3.5–5.1)
Sodium: 133 mmol/L — ABNORMAL LOW (ref 135–145)

## 2021-01-04 LAB — CBC
HCT: 24.6 % — ABNORMAL LOW (ref 36.0–46.0)
Hemoglobin: 7.8 g/dL — ABNORMAL LOW (ref 12.0–15.0)
MCH: 31.6 pg (ref 26.0–34.0)
MCHC: 31.7 g/dL (ref 30.0–36.0)
MCV: 99.6 fL (ref 80.0–100.0)
Platelets: 318 10*3/uL (ref 150–400)
RBC: 2.47 MIL/uL — ABNORMAL LOW (ref 3.87–5.11)
RDW: 18.1 % — ABNORMAL HIGH (ref 11.5–15.5)
WBC: 14.1 10*3/uL — ABNORMAL HIGH (ref 4.0–10.5)
nRBC: 0.7 % — ABNORMAL HIGH (ref 0.0–0.2)

## 2021-01-04 LAB — HEPARIN LEVEL (UNFRACTIONATED): Heparin Unfractionated: <0.1 IU/mL — ABNORMAL LOW (ref 0.30–0.70)

## 2021-01-04 LAB — SURGICAL PATHOLOGY

## 2021-01-04 MED ORDER — HEPARIN BOLUS VIA INFUSION
2300.0000 [IU] | Freq: Once | INTRAVENOUS | Status: AC
  Administered 2021-01-04: 2300 [IU] via INTRAVENOUS
  Filled 2021-01-04: qty 2300

## 2021-01-04 MED ORDER — MORPHINE SULFATE (PF) 2 MG/ML IV SOLN
1.0000 mg | Freq: Four times a day (QID) | INTRAVENOUS | Status: DC | PRN
  Filled 2021-01-04: qty 0.5

## 2021-01-04 MED ORDER — APIXABAN 5 MG PO TABS
5.0000 mg | ORAL_TABLET | Freq: Two times a day (BID) | ORAL | Status: DC
  Administered 2021-01-04 – 2021-01-10 (×12): 5 mg via ORAL
  Filled 2021-01-04 (×12): qty 1

## 2021-01-04 MED ORDER — HEPARIN SODIUM (PORCINE) 1000 UNIT/ML IJ SOLN
1000.0000 [IU] | Freq: Once | INTRAMUSCULAR | Status: AC
  Administered 2021-01-04: 4300 [IU]

## 2021-01-04 MED ORDER — APIXABAN 5 MG PO TABS: 5.0000 mg | ORAL_TABLET | Freq: Two times a day (BID) | ORAL | Status: DC

## 2021-01-04 MED ORDER — HEPARIN SODIUM (PORCINE) 1000 UNIT/ML IJ SOLN
2000.0000 [IU] | Freq: Once | INTRAMUSCULAR | Status: AC
  Administered 2021-01-04: 2000 [IU] via INTRAVENOUS

## 2021-01-04 MED ORDER — HEPARIN (PORCINE) 25000 UT/250ML-% IV SOLN
2400.0000 [IU]/h | INTRAVENOUS | Status: DC
  Administered 2021-01-04: 2400 [IU]/h via INTRAVENOUS

## 2021-01-04 NOTE — Consult Note (Signed)
Lori [name] for Heparin  Indication: atrial fibrillation  Allergies  Allergen Reactions  . Activase [Alteplase] Shortness Of Breath  . Bee Pollen Anaphylaxis  . Warfarin Sodium Nausea And Vomiting and Rash    Patient Measurements: Height: '5\' 3"'$  (160 cm) Weight: 116.4 kg (256 lb 9.9 oz) IBW/kg (Calculated) : 52.4 Heparin Dosing Weight: 76.7 kg  Vital Signs: Temp: 97.7 F (36.5 C) (02/08 2012) BP: 134/95 (02/08 2012) Pulse Rate: 68 (02/08 2012)  Labs: Recent Labs    01/01/21 1238 01/01/21 1238 01/02/21 0512 01/02/21 1341 01/02/21 2017 01/03/21 0438 01/03/21 2155  HGB 6.7*  --  7.0*  --   --  7.7*  --   HCT 22.1*  --  22.7*  --   --  24.4*  --   PLT 289  --  320  --   --  279  --   HEPARINUNFRC  --    < > 0.29* 0.63 0.80*  --  0.18*  CREATININE 6.54*  --  7.33*  --   --  5.33*  --    < > = values in this interval not displayed.    Estimated Creatinine Clearance: 15.4 mL/min (A) (by C-G formula based on SCr of 5.33 mg/dL (H)).   Medical History: Past Medical History:  Diagnosis Date  . Anemia   . ASCVD (arteriosclerotic cardiovascular disease)    a. s/p prior stenting of LAD b. NSTEMI in 01/2018 requiring DES x3 to RCA 2/2 spiral dissection from ost->mid RCA but residual dzs in dRCA, LAD & OM2; c. 09/2019 Cath/PCI: LM nl, LAD 50 ISR, 65 p/m, 34m(2.5x15 Resolute Onyx DES), 90d, LCX 99d, OM2 99 (2.25x26 Resolute Onyx DES), RCA 40p/272mSR, 85d, RPDA 100ost, RPAV 50. EF 50-55%.  . Calciphylaxis   . Chronic abdominal wound infection   . COVID-19 virus infection 11/2020  . [place]ialysis [place] ([facility]  . Diastolic dysfunction    a. 11/2020 Echo: EF 55-60%, mod conc LVh, gr2 DD, nl RV size/fxn. Sev dil LA. Triv MR. Mod-Sev mitral annular Ca2+. Mild-mod Ao sclerosis w/o stenosis.  . Marland KitchenSRD (end stage renal disease) ([facility]   Due to membranous GN dialysis 09/1996; peritoneal dialysis --? peitonitis; difficult vascular access-->HD MWF.  . Marland KitchenGangrene of left foot ([facility]   a. 11/2020 L forefoot dry gangrene involving 4th and 5th MTP joints & digits, & R 5th MTP joint.  . Marland KitchenERD (gastroesophageal reflux disease)   . Hashimoto thyroiditis   . Hyperparathyroidism   . Hypertension   . Hypothyroidism   . Medically noncompliant   . Morbid obesity ([facility]  . PAD (peripheral artery disease) ([facility]   a. 11/2020 PTA of L peroneal, PTA/thrombectomy, Viabahn stenting x 2 to the L SFA and popliteal arteries (6x25063m 6x150m22m . Persistent atrial fibrillation (HCC)[place] a. Noted during hospitalization 11/2020.    Assessment: Pharmacy consulted to start heparin for afib. No DOAC PTA noted.   2/02 - Hgb 7.5: brillinta held   Goal of Therapy:  Heparin level 0.3-0.7 units/ml Monitor platelets by anticoagulation protocol: Yes   Date Time HL  Rate/Comment 2/1 2351  0.3,   therapeutic 2/2 0837  <0.10,   subthera --2200 unit x1; f/b inc 1250 un/hr 2/2 1846  0.22,   subtherapeutic 2/3 0545 0.25,   subtherapeutic 2/3 1508  0.52,   therapeutic x 1; @ 1600 un/hr 2/3 2237  0.29  Subtherapeutic 1600un/hr 2/4 1148 0.30  LLN Therapeutic; '@1750'$  un/hr 2/4 Pt was started on tirofiban. Heparin was held. Restarted heparin after tirofiban is completed 2/5. Will restart heparin at 1850  2/5 2041 0.24  Subtherapeutic  2/7       0512    0.29                 Subtherapeutic 2/7       1341    0.63.      Continue 2100 units/hr. * Drip stopped 2/8 at 0217* 2/8  2155 0.18, subtherapeutic 2/9 0527 <0.10, subtherapeutic  2/8 @ 2155, subtherapeutic.  Due to uncertainty regarding drip stoppage, restart, rate, and prior HL = 0.80 on 2/7 @ 2017, reordered HL with AM labs to confirm.  2/9 @ 0527, HL dropped to <0.10.  Contacted nurse.  Per RN, no additional stoppages or line issues since previous HL draw.  H/H & Plt are stable this AM.  Plan:  Ordered bolus of 2300 units Increased infusion rate to 2400 units/hr. Recheck HL in 6 hours. CBC with AM labs.   Renda Rolls, PharmD, [facility] 01/04/2021 7:29 AM

## 2021-01-04 NOTE — Progress Notes (Signed)
[facility], [place] 01/04/21  Subjective:   Patient seen during dialysis   HEMODIALYSIS FLOWSHEET:  Blood Flow Rate (mL/min): 375 mL/min Arterial Pressure (mmHg): -140 mmHg Venous Pressure (mmHg): 280 mmHg Transmembrane Pressure (mmHg): 60 mmHg Ultrafiltration Rate (mL/min): 670 mL/min Dialysate Flow Rate (mL/min): 600 ml/min Conductivity: Machine : 13.9 Conductivity: Machine : 13.9 Dialysis Fluid Bolus: Normal Saline Bolus Amount (mL): 150 mL (given to prevent clotting, added to UF goal) Dialysate Change:  (Asked MD about K of 4.6 and 3k bath vs 2k. No new orders mainatin 3k per MD)  Patient states she is doing well.  Denies pain and discomfort. Denies shortness of breath.  Maintains good appetite Continues Hep Gtt  Objective:  Vital signs in last 24 hours:  Temp:  [96.3 F (35.7 C)-98.5 F (36.9 C)] 97.9 F (36.6 C) (02/09 0816) Pulse Rate:  [58-76] 68 (02/09 0816) Resp:  [16-18] 18 (02/09 0816) BP: (113-151)/(71-111) 149/109 (02/09 0816) SpO2:  [95 %-100 %] 95 % (02/09 0816)  Weight change:  Filed Weights   12/29/20 2030 12/30/20 0359 12/31/20 0433  Weight: 113.8 kg 113.5 kg 116.4 kg    Intake/Output:    Intake/Output Summary (Last 24 hours) at 01/04/2021 1041 Last data filed at 01/04/2021 0959 Gross per 24 hour  Intake 1022.52 ml  Output -  Net 1022.52 ml     Physical Exam: General: Resting in bed, NAD  HEENT  normocephalic, atraumatic  Pulm/lungs Clear bilaterally  CVS/Heart  S1-S2 present, irregular rhythm  Abdomen:  soft, nondistended, nontender  Extremities: No peripheral edema  Neurologic:  Alert, able to answer questions   Skin:  No acute lesions or rashes  Access: RIJ permcath       Basic Metabolic Panel:  Recent Labs  Lab 12/30/20 1148 12/31/20 1050 01/01/21 1238 01/02/21 0512 01/03/21 0438 01/04/21 0527  NA 138 136 138 138 136 133*  K 4.5 5.1 4.2 4.5 4.1 4.6  CL 97* 96* 101 99 97* 95*  CO2 '23 22 23  25 26 23  '$ GLUCOSE 94 98 87 100* 87 78  BUN 39* 51* 39* 42* 27* 36*  CREATININE 7.16* 8.37* 6.54* 7.33* 5.33* 6.80*  CALCIUM 9.0 9.0 8.3* 8.4* 8.2* 8.1*  PHOS 4.5  --   --   --   --   --      CBC: Recent Labs  Lab 12/31/20 1050 01/01/21 1238 01/02/21 0512 01/03/21 0438 01/04/21 0527  WBC 17.8* 17.7* 16.0* 15.4* 14.1*  HGB 7.6* 6.7* 7.0* 7.7* 7.8*  HCT 24.6* 22.1* 22.7* 24.4* 24.6*  MCV 100.0 103.3* 102.3* 99.6 99.6  PLT 341 289 320 279 318      Lab Results  Component Value Date   HEPBSAG NON REACTIVE 12/25/2020      Microbiology:  Recent Results (from the past 240 hour(s))  Aerobic/Anaerobic Culture (surgical/deep wound)     Status: None (Preliminary result)   Collection Time: 01/01/21  9:24 AM   Specimen: PATH Other; Tissue  Result Value Ref Range Status   Specimen Description   Final    TOE LEFT Performed at [facility], [address] [place], [place] [address]    Special Requests   Final    NONE Performed at [facility], [address] [place], [place] [address]    Gram Stain   Final    NO WBC SEEN NO ORGANISMS SEEN Performed at [facility], [place] [address], [place], [place] [address]    Culture  Final    RARE STENOTROPHOMONAS MALTOPHILIA SUSCEPTIBILITIES TO FOLLOW NO ANAEROBES ISOLATED; CULTURE IN PROGRESS FOR 5 DAYS    Report Status PENDING  Incomplete    Coagulation Studies: No results for input(s): LABPROT, INR in the last 72 hours.  Urinalysis: No results for input(s): COLORURINE, LABSPEC, PHURINE, GLUCOSEU, HGBUR, BILIRUBINUR, KETONESUR, PROTEINUR, UROBILINOGEN, NITRITE, LEUKOCYTESUR in the last 72 hours.  Invalid input(s): APPERANCEUR    Imaging: No results found.   Medications:   . sodium chloride 0 mL (12/30/20 1523)  . heparin 2,400 Units/hr (01/04/21 0757)   . acetaminophen  650 mg Oral Q8H  . aspirin EC  81 mg Oral Daily  . atorvastatin  40 mg Oral Daily  . Chlorhexidine Gluconate Cloth  6  each Topical Daily  . epoetin (EPOGEN/PROCRIT) injection  8,000 Units Intravenous Q T,Th,Sa-HD  . fentaNYL  50 mcg Intravenous Once  . folic acid  1 mg Oral Daily  . gabapentin  100 mg Oral QHS  . levothyroxine  300 mcg Oral QHS  . metoprolol tartrate  25 mg Oral BID  . midodrine  10 mg Oral TID WC  . pantoprazole  40 mg Oral QHS  . polyethylene glycol  17 g Oral Daily  . senna-docusate  2 tablet Oral QHS  . sevelamer carbonate  3,200 mg Oral TID WC  . sodium chloride flush  10-40 mL Intracatheter Q12H   albuterol, HYDROcodone-acetaminophen, morphine injection, ondansetron **OR** ondansetron (ZOFRAN) IV, ondansetron (ZOFRAN) IV, sevelamer carbonate, sodium chloride flush  Assessment/ Plan:  50 y.o. female with  end stage renal disease on hemodialysis, coronary artery disease, hypertension, peripheral vascular disease, hypothyroidismwas admitted on 12/20/2020 for Cellulitis of fourth toe of left foot [L03.032]   Principal Problem:   Cellulitis of fourth toe of left foot Active Problems:   ESRD on dialysis [facility])   Hypothyroidism   Atrial fibrillation ([place])   Coronary artery disease involving native coronary artery of native heart without angina pectoris   PAD (peripheral artery disease) ([place])  [facility] ([place]) MWF [facility] RIJ permcath  #. ESRD:  Currently receiving dialysis Moved to MWF in hospital due to non-covid status Ordered 2000 unit heparin bolus with dialysis  #Covid 19 infection Positive results on 12/14/20 Precautions discontinued  #. Anemia of CKD HGB continues to improve slightly Lab Results  Component Value Date   HGB 7.8 (L) 01/04/2021  EPO given with dialysis  #. Secondary hyperparathyroidism of renal origin N 25.81  -Sevelamer given with meals    Component Value Date/Time   PTH 312.5 (H) 03/27/2014 0843   Lab Results  Component Value Date   PHOS 4.5 12/30/2020   Will monitor these labs and treat as needed  #. Peripheral  vascular disease: with ischemic changes Surgical dressing remains in place Changed daily by surgical team  #Afib with RVR Heparin gtt contnued. HR controlled on current treatments   LOS: 15 Colon Flattery, NP 2/9/202210:41 AM

## 2021-01-04 NOTE — TOC Progression Note (Addendum)
Transition of Care [facility]) - Progression Note    Patient Details  Name: Lori [name] MRN: [id number] Date of Birth: 1971-07-29  Transition of Care [facility]) CM/SW Com[place], LCSW Phone Number: 01/04/2021, 3:02 PM  Clinical Narrative:   Patient had no bed offers. CSW reached out to SNFs. [facility] able to accept patient, Ebony Hail reported that they would have a bed tomorrow if patient is medically ready. Ebony Hail at [facility] reported they are accepting the [facility] waiver so Josem Kaufmann does not need to be started.    Patient is in dialysis. Spoke with daughter Amy. She is agreeable to patient going to [facility]. She reported patient will be ok with this. Will talk to patient in the morning to confirm. She reported patient goes to [facility] in [place]. Ebony Hail with [facility] reported patient would need to be transferred to [facility] in [place] during her short term rehab. Reached out to Dialysis Coordinator Estill Bamberg, waiting to hear back.  3:45- Estill Bamberg reported she can start working on patient transferring to [facility]. This may take a few days.   4:00- Call from patient's daughter who reported she spoke with patient who reported she is fine going to [facility] as long as she can have visitors. Confirmed with Ebony Hail that patient can have visitors at [facility] and update patient's daughter.  Expected Discharge Plan: Home/Self Care Barriers to Discharge: Continued Medical Work up  Expected Discharge Plan and Services Expected Discharge Plan: Home/Self Care       Living arrangements for the past 2 months: Single Family Home                                       Social Determinants of Health (SDOH) Interventions    Readmission Risk Interventions Readmission Risk Prevention Plan 01/03/2021 12/14/2020  Medication Screening - Complete  Transportation Screening Complete Complete  PCP or Specialist Appt within 3-5 Days Complete -  HRI or Home  Care Consult Complete -  Social Work Consult for [place] Planning/Counseling Complete -  Palliative Care Screening Not Applicable -  Medication Review Press photographer) Complete -  Some recent data might be hidden

## 2021-01-04 NOTE — Progress Notes (Signed)
[facility] NAME: Lori [name]    MR#:  [id number]  DATE OF BIRTH:  1971-08-03  SUBJECTIVE:   Patient seen at dialysis. Tolerating well. Denies any complaints. REVIEW OF SYSTEMS:   Review of Systems  Constitutional: Negative for chills, fever and weight loss.  HENT: Negative for ear discharge, ear pain and nosebleeds.   Eyes: Negative for blurred vision, pain and discharge.  Respiratory: Negative for sputum production, shortness of breath, wheezing and stridor.   Cardiovascular: Negative for chest pain, palpitations, orthopnea and PND.  Gastrointestinal: Negative for abdominal pain, diarrhea, nausea and vomiting.  Genitourinary: Negative for frequency and urgency.  Musculoskeletal: Positive for back pain. Negative for joint pain.  Neurological: Positive for weakness. Negative for sensory change, speech change and focal weakness.  Psychiatric/Behavioral: Negative for depression and hallucinations. The patient is not nervous/anxious.    Tolerating Diet:yes Tolerating PT: rec rehab  DRUG ALLERGIES:   Allergies  Allergen Reactions  . Activase [Alteplase] Shortness Of Breath  . Bee Pollen Anaphylaxis  . Warfarin Sodium Nausea And Vomiting and Rash    VITALS:  Blood pressure 129/64, pulse 72, temperature 97.8 F (36.6 C), resp. rate 17, height '5\' 3"'$  (1.6 m), weight 116.4 kg, SpO2 100 %.  PHYSICAL EXAMINATION:   Physical Exam  GENERAL:  50 y.o.-year-old patient lying in the bed with no acute distress. Appears chronically ill. HEENT: Head atraumatic, normocephalic. Oropharynx and nasopharynx clear.  LUNGS: Normal breath sounds bilaterally, no wheezing, rales, rhonchi. No use of accessory muscles of respiration. Right upper chest HD access CARDIOVASCULAR: S1, S2 normal. No murmurs, rubs, or gallops. Irregular rhythm ABDOMEN: Soft, nontender, nondistended. Bowel sounds present. No organomegaly or mass.  EXTREMITIES:       NEUROLOGIC: gen weakness-- grossly nonfocal PSYCHIATRIC:  patient is alert and oriented x 3.  SKIN:above        LABORATORY PANEL:  CBC Recent Labs  Lab 01/04/21 0527  WBC 14.1*  HGB 7.8*  HCT 24.6*  PLT 318    Chemistries  Recent Labs  Lab 01/04/21 0527  NA 133*  K 4.6  CL 95*  CO2 23  GLUCOSE 78  BUN 36*  CREATININE 6.80*  CALCIUM 8.1*   Cardiac Enzymes No results for input(s): TROPONINI in the last 168 hours. RADIOLOGY:  No results found. ASSESSMENT AND PLAN:    50 y.o.femalewith medical history significant forCAD s/p stent to LAD in 2020, ESRD on HD MWF, hypertension, hypothyroidism, hyperparathyroidism, GERD who was initially admitted to [facility] on 12/14/2020 for a left foot wound. She was started on antibiotics. General surgery evaluated the patient and opined that she may ultimately need a left AKA. Patient wanted to discuss with her vascular surgeon Dr. Delana Meyer before any surgery would be considered. She was transferred to [facility] for further evaluation  Rapid a fib with RVR/hypotension/questionable septic shock in the setting of ESRB and left foot infection --was transferred to ICU on 30 January. she received bolus of amiodarone, digoxin, IV Cardizem drip now off. Levophed discontinued, remains on midodrine. Cardiology following. HR wnl today - cont midodrine - lopressor 25 mg bid - continue asa, was on IV heparin gtt--now changed to po eliquis --cardiology aware of it (secure chat send) - outpt consideration of cardioversion--pt follows with Dr Terrence Dupont  Left foot wound infection with gangrene/chronic ischemia of the left lower extremity, now s/p ray amputation of left 4th and 5th toes --On admission necrotic/ischemic appearance of left fourth  and fifth toes. transferredfrom [name] for vascular surgery evaluation. Status post aortogram and left lower extremity angiogram with angioplasty of left peroneal artery/left SFA and popliteal  arteries along with mechanical thrombectomy and stent placement of the left SFA and popliteal artery on 12/22/2020 --s/p repeat Angiogram on 2/4 with angioplasty and multiple stents placed. --  s/p ray amputation of left 4th and 5th toes by podiatry on 2/6, immediate post-op course complicated by hypotension. - follow vascular surgery and podiatry recs - Antibiotics finished. received cefepime/flagyl 1/26-1/27, zosyn 1/27-2/1, unasyn 2/1>2/6. Wound culture has grown Enterococcus faecalis and Proteus vulgaris.  - pt/ot advising snf, pt hesitant but agreeable, will ask care manager to proceed w/ that  End-stage renal disease on hemodialysis Hyperkalemia Nephrology following. Initially treated with covid t/t/s schedule, now that covid precautions discontinued has transitioned to mwf schedule. Tolerated dialysis yesterday.  Hyperkalemia resolved w/ dialysis - f/u nephro recs, cont mwf dialysis  Anemia of chronic disease Folic acid deficiency H has slowly downtrended, was 7 yesterday, received 1 u during dialysis, is 7.8 . No melena or other bleeding. Folate low, b12 normal - epo w/ HD - started folic acid  Bronze skin Iron overload Ferritin and tsat markedly elevated, likely 2/2 chronic esrd with blood and iron transfusions. Curbsided nephrology, this can be addressed as outpatient by avoiding future iron administration  COVID-19 positive -Incidentally Covid + 12/14/2020. She has been asymptomatic.  --She states she has been fully vaccinated including booster dose.  --She received 3 days IV remdesivir. Currently no need for further treatment. Precautions discontinued  CAD: s/p multiple stents -Continue aspirin, BB and statin --follows with Dr Terrence Dupont ([facility])  Hypertension: with relative hypotension -cont BB  Hypothyroidism: TSH markedly elevated, pt says was not taking synthroid for several weeks prior to arrival because pharmacy didn't have her meds - home  synthroid resumed, will need repeat TSH in several weeks    DVT prophylaxis:apixaban Code Status:Full Family Communication:dter Amy on the phone Disposition Plan: SNF Status is: Inpatient     Dispo: The patient is from: Home Anticipated d/c is to: SNF Anticipated d/c date is:  once insurance approval for [facility] in [place] goes through and patient has outpatient dialysis transferred to [facility] in [place] patient currently is medically best at baseline and ok  to d/c. Difficult to place patient No  Consultants:Vascular surgery/nephrology/podiatry/ cardilogy  Procedures: - aortogram and left lower extremity angiogram with angioplasty of left peroneal artery/left SFA and popliteal arteries along with mechanical thrombectomy and stent placement of the left SFA and popliteal artery on 12/22/2020.  - Repeat angiogram with angioplasty and stent placement 2/4. - ray amputation left 4th and 5th toes 2/6    Level of care: Med-Surg Status is: Inpatient       TOTAL TIME TAKING CARE OF THIS PATIENT: *25* minutes.  >50% time spent on counselling and coordination of care  Note: This dictation was prepared with Dragon dictation along with smaller phrase technology. Any transcriptional errors that result from this process are unintentional.  Fritzi Mandes M.D    [facility]   CC: Primary care physician; Patient, No Pcp PerPatient ID: Doroteo Bradford, female   DOB: [birth year], 50 y.o.   MRN: [id number]

## 2021-01-04 NOTE — Progress Notes (Signed)
Physical Therapy Treatment Patient Details Name: Lori [name] MRN: [id number] DOB: Oct 19, 1971 Today's Date: 01/04/2021    History of Present Illness Pt is a 50 y.o. female initially admitted to [facility] 1/19 s/p fall during car transfer after finishing dialysis; also c/o chronic wound on L foot (present about 1 month).  Pt transferred to [facility] 12/20/20 for vascular surgery evaluation of L foot vascular disease.  Pt admitted with L foot wound infection with chronic ischemia of L LE, ESRD on HD MWF, COVID-19 (+) 12/14/20, and paroxysmal a-fib.  1/27 s/p balloon angioplasty, thrombectomy and 2 stents placed to SFA and L popliteal.  1/29 surgical case aborted d/t pt unstable after anesthesia.  1/30 pt noted with a-fib with RVR and transferred to ICU after rapid response (pt in a-fib with RVR and hypotension).  2/4 s/p angio with angioplasty and stent placement.  2/6 s/p amputation L 4th and 5th ray (pt bradycardic and hypotensive end of surgery but responded to treatment).  PMH includes anemia, ASCVD, calciphylaxis, chronic abdominal wound infection, ESRD, Hashimoto thyroiditis, htn, morbid obesity, partial thyroidectomy, CAD s/p stent to LAD.    PT Comments    Pt resting in bed upon PT arrival; pt reporting 8/10 abdominal pain and reporting nurse already gave her medication for it.  Pt agreeable to seeing how LE ex's went in bed prior to attempting OOB mobility.  Pt requiring assist for LE ex's (L>R LE); L LE limited more d/t L knee pain than L foot pain (pt reporting no L foot pain during session; pt also reporting chronic OA in L knee causing chronic knee pain).  Pt reporting being too fatigued after finishing LE ex's in bed to attempt OOB mobility so session ended (pt repositioned for comfort in bed).  End of session pt reporting abdominal pain improved to 5/10.  MD Posey Pronto notified regarding pt's c/o abdominal pain this morning.    Follow Up Recommendations  SNF     Equipment Recommendations   Rolling walker with 5" wheels;3in1 (PT);Wheelchair (measurements PT);Wheelchair cushion (measurements PT);Hospital bed    Recommendations for Other Services OT consult     Precautions / Restrictions Precautions Precautions: Fall Precaution Comments: B post op shoes Restrictions Weight Bearing Restrictions: Yes Other Position/Activity Restrictions: Heel WB'ing only to L foot per secure messaging with MD Cleda Mccreedy 01/03/21; R 5th MTP joint dry gangrene    Mobility  Bed Mobility Overal bed mobility:  (Pt declined OOB d/t abdominal pain and fatigue post ex's)                Transfers                    Ambulation/Gait                 Stairs             Wheelchair Mobility    Modified Rankin (Stroke Patients Only)       Balance                                            Cognition Arousal/Alertness: Awake/alert Behavior During Therapy: WFL for tasks assessed/performed Overall Cognitive Status: Within Functional Limits for tasks assessed  Exercises General Exercises - Lower Extremity Ankle Circles/Pumps: AROM;Strengthening;Both;10 reps;Supine Quad Sets: AROM;Strengthening;Both;10 reps;Supine Short Arc Quad: AAROM;Strengthening;Both;10 reps;Supine Heel Slides: AAROM;Strengthening;Both;10 reps;Supine Hip ABduction/ADduction: AAROM;Strengthening;Both;10 reps;Supine    General Comments General comments (skin integrity, edema, etc.): L foot in bandage.  Nursing cleared pt for participation in physical therapy.  Pt agreeable to PT session.      Pertinent Vitals/Pain Pain Assessment: 0-10 Pain Score: 5  Pain Location: abdomen Pain Descriptors / Indicators: Aching;Constant;Discomfort Pain Intervention(s): Limited activity within patient's tolerance;Monitored during session;Repositioned;Other (comment) (MD notified regarding pt's abdominal pain)    Home Living                       Prior Function            PT Goals (current goals can now be found in the care plan section) Acute Rehab PT Goals Patient Stated Goal: to return home PT Goal Formulation: With patient Time For Goal Achievement: 01/17/21 Potential to Achieve Goals: Fair Progress towards PT goals: Progressing toward goals    Frequency    7X/week      PT Plan Current plan remains appropriate    Co-evaluation              AM-PAC PT "6 Clicks" Mobility   Outcome Measure  Help needed turning from your back to your side while in a flat bed without using bedrails?: A Lot Help needed moving from lying on your back to sitting on the side of a flat bed without using bedrails?: A Lot Help needed moving to and from a bed to a chair (including a wheelchair)?: Total Help needed standing up from a chair using your arms (e.g., wheelchair or bedside chair)?: Total Help needed to walk in hospital room?: Total Help needed climbing 3-5 steps with a railing? : Total 6 Click Score: 8    End of Session Equipment Utilized During Treatment: Oxygen (2 L O2 via nasal cannula) Activity Tolerance: Patient limited by fatigue;Patient limited by pain Patient left: in bed;with call bell/phone within reach;with bed alarm set;Other (comment) (L heel floating via pillow support) Nurse Communication: Mobility status;Precautions;Weight bearing status (via white board) PT Visit Diagnosis: Other abnormalities of gait and mobility (R26.89);Muscle weakness (generalized) (M62.81);History of falling (Z91.81);Difficulty in walking, not elsewhere classified (R26.2);Pain Pain - Right/Left: Left Pain - part of body: Ankle and joints of foot     Time: [id number] PT Time Calculation (min) (ACUTE ONLY): 18 min  Charges:  $Therapeutic Exercise: 8-22 mins                    Leitha Bleak, PT 01/04/21, 12:14 PM

## 2021-01-04 NOTE — Progress Notes (Incomplete)
Patient arrived for dialysis, upon assessment of her HD catheter, the arterial lumen had no blood return. Dr. Holley Raring was at bedside at initiation of Hd and was made aware that lines had to be reversed and cvc may not achieve the ordered blood flow. Ok to initiate HD as per MD. Also, based on patient's potassium result of 4.51mol/L today, this RN questioned if MD wanted to change the bath. Maintained 3k bath as ordered per instructions from Dr. LHolley Raring

## 2021-01-05 MED ORDER — HYDRALAZINE HCL 20 MG/ML IJ SOLN: 10.0000 mg | Freq: Four times a day (QID) | INTRAMUSCULAR | Status: DC | PRN

## 2021-01-05 NOTE — Progress Notes (Signed)
[facility], [place] 01/05/21  Subjective:   Complains of stomach upset States she was given something to settle it Denies nausea and vomiting Currently O2-2L No complaints of shortness of breath D/c'd Hep Gtt  Objective:  Vital signs in last 24 hours:  Temp:  [97.5 F (36.4 C)-98.5 F (36.9 C)] 97.8 F (36.6 C) (02/10 1211) Pulse Rate:  [58-93] 65 (02/10 1211) Resp:  [14-21] 16 (02/10 1211) BP: (112-136)/(64-116) 129/116 (02/10 1211) SpO2:  [97 %-100 %] 97 % (02/10 1211)  Weight change:  Filed Weights   12/29/20 2030 12/30/20 0359 12/31/20 0433  Weight: 113.8 kg 113.5 kg 116.4 kg    Intake/Output:    Intake/Output Summary (Last 24 hours) at 01/05/2021 1213 Last data filed at 01/05/2021 1036 Gross per 24 hour  Intake 260 ml  Output 1355 ml  Net -1095 ml     Physical Exam: General: Resting in bed, NAD  HEENT  normocephalic, atraumatic  Pulm/lungs Clear bilaterally  CVS/Heart  S1-S2 present, irregular rhythm  Abdomen:  soft, nondistended, nontender  Extremities: No peripheral edema  Neurologic:  Alert, able to answer questions   Skin:  No acute lesions or rashes  Access: RIJ permcath       Basic Metabolic Panel:  Recent Labs  Lab 12/30/20 1148 12/31/20 1050 01/01/21 1238 01/02/21 0512 01/03/21 0438 01/04/21 0527  NA 138 136 138 138 136 133*  K 4.5 5.1 4.2 4.5 4.1 4.6  CL 97* 96* 101 99 97* 95*  CO2 '23 22 23 25 26 23  '$ GLUCOSE 94 98 87 100* 87 78  BUN 39* 51* 39* 42* 27* 36*  CREATININE 7.16* 8.37* 6.54* 7.33* 5.33* 6.80*  CALCIUM 9.0 9.0 8.3* 8.4* 8.2* 8.1*  PHOS 4.5  --   --   --   --   --      CBC: Recent Labs  Lab 12/31/20 1050 01/01/21 1238 01/02/21 0512 01/03/21 0438 01/04/21 0527  WBC 17.8* 17.7* 16.0* 15.4* 14.1*  HGB 7.6* 6.7* 7.0* 7.7* 7.8*  HCT 24.6* 22.1* 22.7* 24.4* 24.6*  MCV 100.0 103.3* 102.3* 99.6 99.6  PLT 341 289 320 279 318      Lab Results  Component Value Date   HEPBSAG NON  REACTIVE 12/25/2020      Microbiology:  Recent Results (from the past 240 hour(s))  Aerobic/Anaerobic Culture (surgical/deep wound)     Status: None (Preliminary result)   Collection Time: 01/01/21  9:24 AM   Specimen: PATH Other; Tissue  Result Value Ref Range Status   Specimen Description   Final    TOE LEFT Performed at [facility], [address], [place], [place] [address]    Special Requests   Final    NONE Performed at [facility], [address] [place], [place] [address]    Gram Stain   Final    NO WBC SEEN NO ORGANISMS SEEN Performed at [facility], [place] [address], [place], [place] [address]    Culture   Final    RARE STENOTROPHOMONAS MALTOPHILIA NO ANAEROBES ISOLATED; CULTURE IN PROGRESS FOR 5 DAYS    Report Status PENDING  Incomplete   Organism ID, Bacteria STENOTROPHOMONAS MALTOPHILIA  Final      Susceptibility   Stenotrophomonas maltophilia - MIC*    LEVOFLOXACIN 1 SENSITIVE Sensitive     TRIMETH/SULFA <=20 SENSITIVE Sensitive     * RARE STENOTROPHOMONAS MALTOPHILIA    Coagulation Studies: No results for input(s): LABPROT, INR in the  last 72 hours.  Urinalysis: No results for input(s): COLORURINE, LABSPEC, PHURINE, GLUCOSEU, HGBUR, BILIRUBINUR, KETONESUR, PROTEINUR, UROBILINOGEN, NITRITE, LEUKOCYTESUR in the last 72 hours.  Invalid input(s): APPERANCEUR    Imaging: No results found.   Medications:   . sodium chloride 0 mL (12/30/20 1523)   . acetaminophen  650 mg Oral Q8H  . apixaban  5 mg Oral BID  . aspirin EC  81 mg Oral Daily  . atorvastatin  40 mg Oral Daily  . Chlorhexidine Gluconate Cloth  6 each Topical Daily  . epoetin (EPOGEN/PROCRIT) injection  8,000 Units Intravenous Q T,Th,Sa-HD  . folic acid  1 mg Oral Daily  . gabapentin  100 mg Oral QHS  . levothyroxine  300 mcg Oral QHS  . metoprolol tartrate  25 mg Oral BID  . midodrine  10 mg Oral TID WC  . pantoprazole  40 mg Oral QHS  . polyethylene  glycol  17 g Oral Daily  . senna-docusate  2 tablet Oral QHS  . sevelamer carbonate  3,200 mg Oral TID WC  . sodium chloride flush  10-40 mL Intracatheter Q12H   albuterol, HYDROcodone-acetaminophen, morphine injection, ondansetron **OR** ondansetron (ZOFRAN) IV, ondansetron (ZOFRAN) IV, sevelamer carbonate, sodium chloride flush  Assessment/ Plan:  50 y.o. female with  end stage renal disease on hemodialysis, coronary artery disease, hypertension, peripheral vascular disease, hypothyroidismwas admitted on 12/20/2020 for Cellulitis of fourth toe of left foot [L03.032]   Principal Problem:   Cellulitis of fourth toe of left foot Active Problems:   ESRD on dialysis [facility])   Hypothyroidism   Atrial fibrillation ([place])   Coronary artery disease involving native coronary artery of native heart without angina pectoris   PAD (peripheral artery disease) ([place])  [facility] ([place]) MWF [facility] RIJ permcath  #. ESRD:  Received dialysis yesterday. No acute concerns with treatment, [id number] D/c planning to include SNF Attempted to provide outpatient dialysis at [facility] Referral prepared by case management  #Covid 19 infection Diagnosed on 12/14/20 Surpassed isolation requirements   #. Anemia of CKD HGB remains low in the setting renal failure Lab Results  Component Value Date   HGB 7.8 (L) 01/04/2021  EPO 8,000 given with dialysis  #. Secondary hyperparathyroidism of renal origin N 25.81  -receives Sevelamer TID with meals    Component Value Date/Time   PTH 312.5 (H) 03/27/2014 0843   Lab Results  Component Value Date   PHOS 4.5 12/30/2020   Continue to monitor these labs  #. Peripheral vascular disease: with ischemic changes Dressing changed daily. Surgical team will manage  #Afib with RVR Discontinued heparin drip. Plan for outpatient cardioversion   LOS: 16 Colon Flattery, NP 2/10/202212:13 PM

## 2021-01-05 NOTE — Care Management Important Message (Signed)
Important Message  Patient Details  Name: IOWA PLATEK MRN: [id number] Date of Birth: 1971-04-27   Medicare Important Message Given:  Yes     Dannette Barbara 01/05/2021, 1:59 PM

## 2021-01-05 NOTE — Plan of Care (Signed)
  Problem: Education: Goal: Knowledge of General Education information will improve Description: Including pain rating scale, medication(s)/side effects and non-pharmacologic comfort measures 01/05/2021 1123 by Trula Slade, RN Outcome: Progressing 01/05/2021 1048 by Trula Slade, RN Outcome: Progressing   Problem: Health Behavior/Discharge Planning: Goal: Ability to manage health-related needs will improve 01/05/2021 1123 by Trula Slade, RN Outcome: Progressing 01/05/2021 1048 by Trula Slade, RN Outcome: Progressing   Problem: Clinical Measurements: Goal: Ability to maintain clinical measurements within normal limits will improve 01/05/2021 1123 by Trula Slade, RN Outcome: Progressing 01/05/2021 1048 by Trula Slade, RN Outcome: Progressing Goal: Will remain free from infection 01/05/2021 1123 by Trula Slade, RN Outcome: Progressing 01/05/2021 1048 by Trula Slade, RN Outcome: Progressing Goal: Diagnostic test results will improve 01/05/2021 1123 by Trula Slade, RN Outcome: Progressing 01/05/2021 1048 by Trula Slade, RN Outcome: Progressing Goal: Respiratory complications will improve 01/05/2021 1123 by Trula Slade, RN Outcome: Progressing 01/05/2021 1048 by Trula Slade, RN Outcome: Progressing Goal: Cardiovascular complication will be avoided 01/05/2021 1123 by Trula Slade, RN Outcome: Progressing 01/05/2021 1048 by Trula Slade, RN Outcome: Progressing   Problem: Activity: Goal: Risk for activity intolerance will decrease 01/05/2021 1123 by Trula Slade, RN Outcome: Progressing 01/05/2021 1048 by Trula Slade, RN Outcome: Progressing   Problem: Nutrition: Goal: Adequate nutrition will be maintained 01/05/2021 1123 by Trula Slade, RN Outcome: Progressing 01/05/2021 1048 by Trula Slade, RN Outcome: Progressing   Problem: Coping: Goal: Level of anxiety will decrease 01/05/2021  1123 by Trula Slade, RN Outcome: Progressing 01/05/2021 1048 by Trula Slade, RN Outcome: Progressing   Problem: Pain Managment: Goal: General experience of comfort will improve 01/05/2021 1123 by Trula Slade, RN Outcome: Progressing 01/05/2021 1048 by Trula Slade, RN Outcome: Progressing

## 2021-01-05 NOTE — Progress Notes (Signed)
   01/04/21 1124 01/04/21 1130 01/04/21 1145  Vitals  BP (!) 145/123 92/74 96/62  MAP (mmHg) 129 81 73  Pulse Rate 76  --   --   ECG Heart Rate 73 65 91  Resp (!) '21 16 16  '$ During Hemodialysis Assessment  Blood Flow Rate (mL/min) 300 mL/min 300 mL/min 300 mL/min  Arterial Pressure (mmHg) -100 mmHg -100 mmHg -100 mmHg  Venous Pressure (mmHg) 280 mmHg 280 mmHg 280 mmHg  Transmembrane Pressure (mmHg) 60 mmHg 60 mmHg 60 mmHg  Ultrafiltration Rate (mL/min) 670 mL/min 670 mL/min 670 mL/min  Dialysate Flow Rate (mL/min) 600 ml/min 600 ml/min 600 ml/min  Conductivity: Machine  '14 14 14  '$ HD Safety Checks Performed Yes Yes Yes  Dialysis Fluid Bolus Normal Saline Normal Saline Normal Saline  Bolus Amount (mL) 0 mL 0 mL 0 mL  Intra-Hemodialysis Comments Tx initiated (at 1122) Progressing as prescribed Progressing as prescribed    01/04/21 1200 01/04/21 1215 01/04/21 1230  Vitals  BP 119/82 (!) 135/99 (!) 120/95  MAP (mmHg) 96 110 103  Pulse Rate 75 65 72  ECG Heart Rate 78 69 72  Resp '17 15 14  '$ During Hemodialysis Assessment  Blood Flow Rate (mL/min) 300 mL/min 350 mL/min 350 mL/min  Arterial Pressure (mmHg) -100 mmHg -130 mmHg -130 mmHg  Venous Pressure (mmHg) 280 mmHg 260 mmHg 260 mmHg  Transmembrane Pressure (mmHg) 60 mmHg  --  60 mmHg  Ultrafiltration Rate (mL/min)  --  670 mL/min 670 mL/min  Dialysate Flow Rate (mL/min)  --  600 ml/min 600 ml/min  Conductivity: Machine   --  13.9  --   HD Safety Checks Performed Yes Yes Yes  Dialysis Fluid Bolus  --  Normal Saline  --   Bolus Amount (mL)  --  150 mL (given to prevent clotting, added to UF goal)  --   Intra-Hemodialysis Comments Progressing as prescribed Progressing as prescribed Progressing as prescribed

## 2021-01-05 NOTE — Plan of Care (Signed)

## 2021-01-05 NOTE — Progress Notes (Signed)
[facility] NAME: Lori [name]    MR#:  [id number]  DATE OF BIRTH:  1971/06/05  SUBJECTIVE:   Patient overall doing ok. Tolerating well. Denies any complaints. No bleeding reported by RN REVIEW OF SYSTEMS:   Review of Systems  Constitutional: Negative for chills, fever and weight loss.  HENT: Negative for ear discharge, ear pain and nosebleeds.   Eyes: Negative for blurred vision, pain and discharge.  Respiratory: Negative for sputum production, shortness of breath, wheezing and stridor.   Cardiovascular: Negative for chest pain, palpitations, orthopnea and PND.  Gastrointestinal: Negative for abdominal pain, diarrhea, nausea and vomiting.  Genitourinary: Negative for frequency and urgency.  Musculoskeletal: Positive for back pain. Negative for joint pain.  Neurological: Positive for weakness. Negative for sensory change, speech change and focal weakness.  Psychiatric/Behavioral: Negative for depression and hallucinations. The patient is not nervous/anxious.    Tolerating Diet:yes Tolerating PT: rec rehab  DRUG ALLERGIES:   Allergies  Allergen Reactions  . Activase [Alteplase] Shortness Of Breath  . Bee Pollen Anaphylaxis  . Warfarin Sodium Nausea And Vomiting and Rash    VITALS:  Blood pressure (!) 129/116, pulse 65, temperature 97.8 F (36.6 C), resp. rate 16, height '5\' 3"'$  (1.6 m), weight 116.4 kg, SpO2 97 %.  PHYSICAL EXAMINATION:   Physical Exam  GENERAL:  50 y.o.-year-old patient lying in the bed with no acute distress. Appears chronically ill. HEENT: Head atraumatic, normocephalic. Oropharynx and nasopharynx clear.  LUNGS: Normal breath sounds bilaterally, no wheezing, rales, rhonchi. No use of accessory muscles of respiration. Right upper chest HD access CARDIOVASCULAR: S1, S2 normal. No murmurs, rubs, or gallops. Irregular rhythm ABDOMEN: Soft, nontender, nondistended. Bowel sounds present. No organomegaly or  mass.  EXTREMITIES:      NEUROLOGIC: gen weakness-- grossly nonfocal PSYCHIATRIC:  patient is alert and oriented x 3.  SKIN:above   LABORATORY PANEL:  CBC Recent Labs  Lab 01/04/21 0527  WBC 14.1*  HGB 7.8*  HCT 24.6*  PLT 318    Chemistries  Recent Labs  Lab 01/04/21 0527  NA 133*  K 4.6  CL 95*  CO2 23  GLUCOSE 78  BUN 36*  CREATININE 6.80*  CALCIUM 8.1*   Cardiac Enzymes No results for input(s): TROPONINI in the last 168 hours. RADIOLOGY:  No results found. ASSESSMENT AND PLAN:    50 y.o.femalewith medical history significant forCAD s/p stent to LAD in 2020, ESRD on HD MWF, hypertension, hypothyroidism, hyperparathyroidism, GERD who was initially admitted to [facility] on 12/14/2020 for a left foot wound. She was started on antibiotics. General surgery evaluated the patient and opined that she may ultimately need a left AKA. Patient wanted to discuss with her vascular surgeon Dr. Delana Meyer before any surgery would be considered. She was transferred to [facility] for further evaluation  Rapid a fib with RVR/hypotension/questionable septic shock in the setting of ESRB and left foot infection --was transferred to ICU on 30 January. she received bolus of amiodarone, digoxin, IV Cardizem drip now off. Levophed discontinued, remains on midodrine. Cardiology following. HR wnl today - cont midodrine - lopressor 25 mg bid - continue asa, was on IV heparin gtt--now changed to po eliquis (on 01/04/21) --cardiology aware of oral anticoagulation - outpt consideration of cardioversion--pt follows with Dr Terrence Dupont  Left foot wound infection with gangrene/chronic ischemia of the left lower extremity, now s/p ray amputation of left 4th and 5th toes --On admission, necrotic/ischemic appearance of left  fourth and fifth toes. transferredfrom [name] for vascular surgery evaluation. Status post aortogram and left lower extremity angiogram with angioplasty of left peroneal  artery/left SFA and popliteal arteries along with mechanical thrombectomy and stent placement of the left SFA and popliteal artery on 12/22/2020 --s/p repeat Angiogram on 2/4 with angioplasty and multiple stents placed. --  s/p ray amputation of left 4th and 5th toes by podiatry on 2/6, immediate post-op course complicated by hypotension. - follow vascular surgery and podiatry recs - Antibiotics finished. received cefepime/flagyl 1/26-1/27, zosyn 1/27-2/1, unasyn 2/1>2/6. Wound culture has grown Enterococcus faecalis and Proteus vulgaris.  - pt/ot advising snf  End-stage renal disease on hemodialysis Hyperkalemia Nephrology following. Initially treated with covid t/t/s schedule, now that covid precautions discontinued has transitioned to mwf schedule. Tolerated dialysis yesterday.  Hyperkalemia resolved w/ dialysis - f/u nephro recs, cont mwf dialysis  Anemia of chronic disease Folic acid deficiency H has slowly downtrended, was 7 yesterday, received 1 u during dialysis, is 7.8 . No melena or other bleeding. Folate low, b12 normal - epo w/ HD - started folic acid  Bronze skin Iron overload Ferritin and tsat markedly elevated, likely 2/2 chronic esrd with blood and iron transfusions. Curbsided nephrology, this can be addressed as outpatient by avoiding future iron administration  COVID-19 positive -Incidentally Covid + 12/14/2020. She has been asymptomatic.  --She states she has been fully vaccinated including booster dose.  --She received 3 days IV remdesivir. Currently no need for further treatment. Precautions discontinued  CAD: s/p multiple stents -Continue aspirin, BB and statin --follows with Dr Terrence Dupont ([facility])  Hypertension: with relative hypotension -cont BB -d/ced midodrine  Hypothyroidism: TSH markedly elevated, pt says was not taking synthroid for several weeks prior to arrival because pharmacy didn't have her meds - home synthroid resumed, will  need repeat TSH in several weeks    DVT prophylaxis:apixaban Code Status:Full Family Communication:dter Lori [name] on the phone 2/9 Disposition Plan: SNF Status is: Inpatient     Dispo: The patient is from: Home Anticipated d/c is to: SNF Anticipated d/c date is:  To [facility] in [place]  and patient has outpatient dialysis chair time still pending patient currently is medically best at baseline and ok  to d/c. Difficult to place patient No  Consultants:Vascular surgery/nephrology/podiatry/ cardilogy  Procedures: - aortogram and left lower extremity angiogram with angioplasty of left peroneal artery/left SFA and popliteal arteries along with mechanical thrombectomy and stent placement of the left SFA and popliteal artery on 12/22/2020.  - Repeat angiogram with angioplasty and stent placement 2/4. - ray amputation left 4th and 5th toes 2/6    Level of care: Med-Surg Status is: Inpatient       TOTAL TIME TAKING CARE OF THIS PATIENT: *25* minutes.  >50% time spent on counselling and coordination of care  Note: This dictation was prepared with Dragon dictation along with smaller phrase technology. Any transcriptional errors that result from this process are unintentional.  Fritzi Mandes M.D    [facility]   CC: Primary care physician; Patient, No Pcp PerPatient ID: Lori [name], female   DOB: [birth year], 50 y.o.   MRN: [id number]

## 2021-01-05 NOTE — Progress Notes (Signed)
Physical Therapy Treatment Patient Details Name: Lori [name] MRN: [id number] DOB: August 24, 1971 Today's Date: 01/05/2021    History of Present Illness Pt is a 50 y.o. female initially admitted to [facility] 1/19 s/p fall during car transfer after finishing dialysis; also c/o chronic wound on L foot (present about 1 month).  Pt transferred to [facility] 12/20/20 for vascular surgery evaluation of L foot vascular disease.  Pt admitted with L foot wound infection with chronic ischemia of L LE, ESRD on HD MWF, COVID-19 (+) 12/14/20, and paroxysmal a-fib.  1/27 s/p balloon angioplasty, thrombectomy and 2 stents placed to SFA and L popliteal.  1/29 surgical case aborted d/t pt unstable after anesthesia.  1/30 pt noted with a-fib with RVR and transferred to ICU after rapid response (pt in a-fib with RVR and hypotension).  2/4 s/p angio with angioplasty and stent placement.  2/6 s/p amputation L 4th and 5th ray (pt bradycardic and hypotensive end of surgery but responded to treatment).  PMH includes anemia, ASCVD, calciphylaxis, chronic abdominal wound infection, ESRD, Hashimoto thyroiditis, htn, morbid obesity, partial thyroidectomy, CAD s/p stent to LAD.    PT Comments    Brought unwheeled BRW to attempt trial of standing, on arrival pt states she is not very interested in doing much PT at all and that she is too tired, bloated and weak to try sitting today.  Phone started ringing and she took the call and showed no indication that she intended to hang up anytime soon to participate with PT.  PT indicated that we would try again after a while.  On return she was agreeable to doing some minimal exercises and states "I want to try sitting tomorrow, I really will."  Pt with severe OA in both knees not allowing TKE and pain with essentially all movement (pt reports that when standing she can tolerate knee extension on R).  Pt did show good effort with supine exercises but is pain limited t/o and generally struggled  to show a lot of functional strength this date.    Follow Up Recommendations  SNF     Equipment Recommendations  Rolling walker with 5" wheels;3in1 (PT);Wheelchair (measurements PT);Wheelchair cushion (measurements PT);Hospital bed    Recommendations for Other Services       Precautions / Restrictions Precautions Precautions: Fall Restrictions LLE Weight Bearing: Partial weight bearing LLE Partial Weight Bearing Percentage or Pounds: heel only with post-op shoe    Mobility  Bed Mobility               General bed mobility comments: Pt refuses mobility today, states "I do want to try tomorrow"    Transfers                    Ambulation/Gait                 Stairs             Wheelchair Mobility    Modified Rankin (Stroke Patients Only)       Balance                                            Cognition Arousal/Alertness: Awake/alert Behavior During Therapy: WFL for tasks assessed/performed Overall Cognitive Status: Within Functional Limits for tasks assessed  Exercises General Exercises - Lower Extremity Ankle Circles/Pumps: AROM;15 reps Short Arc Quad: AAROM;10 reps (lacks TKE b/l) Heel Slides: AAROM;5 reps;Strengthening (resisted leg extensions) Hip ABduction/ADduction: AAROM;10 reps    General Comments        Pertinent Vitals/Pain Pain Assessment: 0-10 Pain Score: 8  Pain Location: abdomen, b/l knees with any movement    Home Living                      Prior Function            PT Goals (current goals can now be found in the care plan section) Progress towards PT goals: Not progressing toward goals - comment (not wanting/able to get up sitting today)    Frequency    7X/week      PT Plan Current plan remains appropriate    Co-evaluation              AM-PAC PT "6 Clicks" Mobility   Outcome Measure  Help needed  turning from your back to your side while in a flat bed without using bedrails?: A Lot Help needed moving from lying on your back to sitting on the side of a flat bed without using bedrails?: A Lot Help needed moving to and from a bed to a chair (including a wheelchair)?: Total Help needed standing up from a chair using your arms (e.g., wheelchair or bedside chair)?: Total Help needed to walk in hospital room?: Total Help needed climbing 3-5 steps with a railing? : Total 6 Click Score: 8    End of Session   Activity Tolerance: Patient limited by fatigue;Patient limited by pain Patient left: in bed;with call bell/phone within reach;with bed alarm set;Other (comment) Nurse Communication: Mobility status PT Visit Diagnosis: Other abnormalities of gait and mobility (R26.89);Muscle weakness (generalized) (M62.81);History of falling (Z91.81);Difficulty in walking, not elsewhere classified (R26.2);Pain Pain - Right/Left: Left Pain - part of body: Ankle and joints of foot     Time: 1646-1700 PT Time Calculation (min) (ACUTE ONLY): 14 min  Charges:  $Therapeutic Exercise: 8-22 mins                     Kreg Shropshire, DPT 01/05/2021, 5:54 PM

## 2021-01-05 NOTE — Progress Notes (Signed)
   01/02/21 1545 01/02/21 1600 01/02/21 1615  Vitals  Temp (!) 97.1 F (36.2 C)  --   --   Temp Source Oral  --   --   BP  --  (!) 79/53  --   MAP (mmHg)  --  (!) 63  --   ECG Heart Rate (!) 109 (!) 142  --   Resp 12 17  --   During Hemodialysis Assessment  Blood Flow Rate (mL/min) 300 mL/min 300 mL/min 300 mL/min  Arterial Pressure (mmHg) -110 mmHg -110 mmHg -110 mmHg  Venous Pressure (mmHg) 210 mmHg 210 mmHg 210 mmHg  Transmembrane Pressure (mmHg) 20 mmHg 20 mmHg 20 mmHg  Ultrafiltration Rate (mL/min) 670 mL/min 670 mL/min 670 mL/min  Dialysate Flow Rate (mL/min) 600 ml/min 600 ml/min 600 ml/min  HD Safety Checks Performed Yes Yes Yes  Dialysis Fluid Bolus Normal Saline  --   --   Bolus Amount (mL) 200 mL  --   --   Dialysate Change  (3K 2.5Ca)  --   --   Intra-Hemodialysis Comments Tx initiated Progressing as prescribed Progressing as prescribed    01/02/21 1630 01/02/21 1645 01/02/21 1700  Vitals  Temp  --   --   --   Temp Source  --   --   --   BP  --  121/87 (!) 127/94  MAP (mmHg)  --  96 106  ECG Heart Rate (!) 132 100 97  Resp '17 17 15  '$ During Hemodialysis Assessment  Blood Flow Rate (mL/min) 300 mL/min 300 mL/min 300 mL/min  Arterial Pressure (mmHg) -110 mmHg -110 mmHg -110 mmHg  Venous Pressure (mmHg) 210 mmHg 210 mmHg 210 mmHg  Transmembrane Pressure (mmHg) 20 mmHg 20 mmHg 20 mmHg  Ultrafiltration Rate (mL/min) 670 mL/min 670 mL/min 670 mL/min  Dialysate Flow Rate (mL/min) 600 ml/min 600 ml/min 600 ml/min  HD Safety Checks Performed Yes Yes Yes  Dialysis Fluid Bolus  --   --   --   Bolus Amount (mL)  --   --   --   Dialysate Change  --   --   --   Intra-Hemodialysis Comments Progressing as prescribed Progressing as prescribed Progressing as prescribed    01/02/21 1715 01/02/21 1730 01/02/21 1745  Vitals  Temp  --   --   --   Temp Source  --   --   --   BP (!) 137/51 111/86 129/86  MAP (mmHg) 74 95 97  ECG Heart Rate 90 (!) 111 81  Resp '15 17 15  '$ During  Hemodialysis Assessment  Blood Flow Rate (mL/min) 300 mL/min 300 mL/min 300 mL/min  Arterial Pressure (mmHg) -110 mmHg -110 mmHg -110 mmHg  Venous Pressure (mmHg) 210 mmHg 210 mmHg 210 mmHg  Transmembrane Pressure (mmHg) 20 mmHg 20 mmHg 20 mmHg  Ultrafiltration Rate (mL/min) 670 mL/min 670 mL/min 670 mL/min  Dialysate Flow Rate (mL/min) 600 ml/min 600 ml/min 600 ml/min  HD Safety Checks Performed Yes Yes Yes  Dialysis Fluid Bolus  --   --   --   Bolus Amount (mL)  --   --   --   Dialysate Change  --   --   --   Intra-Hemodialysis Comments Progressing as prescribed Progressing as prescribed Progressing as prescribed

## 2021-01-05 NOTE — Progress Notes (Signed)
Request made to get patient set up at outpatient clinic in [place] due to SNF placement. Working on this referral, waiting on financial clearance and clinic acceptance.

## 2021-01-05 NOTE — Progress Notes (Signed)
PT Cancellation Note  Patient Details Name: Lori [name] MRN: [id number] DOB: 05-03-1971   Cancelled Treatment:    Reason Eval/Treat Not Completed: Other (comment).  Therapist attempted to see pt around 1330 today but pt had just started lunch and pt requesting therapist return later.  Therapist returned at about 1510 and pt reporting that her husband had just arrived to visit and had brought her hot food to eat and pt requesting therapist return later.  Therapist educated pt that therapist may not be able to return later today but therapy would re-attempt to see pt tomorrow if pt was not discharging.  Leitha Bleak, PT 01/05/21, 3:19 PM

## 2021-01-05 NOTE — Progress Notes (Signed)
   01/04/21 1245 01/04/21 1300 01/04/21 1315  Vitals  BP 129/89 (!) 122/93 (!) 122/95  MAP (mmHg) 101 101 104  ECG Heart Rate 73 69 75  Resp '15 16 18  '$ During Hemodialysis Assessment  Blood Flow Rate (mL/min) 350 mL/min 350 mL/min 350 mL/min  Arterial Pressure (mmHg) -130 mmHg -130 mmHg -130 mmHg  Venous Pressure (mmHg) 260 mmHg 260 mmHg 260 mmHg  Transmembrane Pressure (mmHg) 60 mmHg 60 mmHg 60 mmHg  Ultrafiltration Rate (mL/min) 670 mL/min 670 mL/min 670 mL/min  Dialysate Flow Rate (mL/min) 600 ml/min 600 ml/min 600 ml/min  Conductivity: Machine  13.9  --  13.9  HD Safety Checks Performed Yes Yes Yes  Intra-Hemodialysis Comments Progressing as prescribed Progressing as prescribed Progressing as prescribed    01/04/21 1330 01/04/21 1345 01/04/21 1400  Vitals  BP 112/83 (!) 117/94 (!) 135/100  MAP (mmHg) 94 103 111  ECG Heart Rate 86 88 80  Resp (!) '21 17 18  '$ During Hemodialysis Assessment  Blood Flow Rate (mL/min) 375 mL/min 375 mL/min  --   Arterial Pressure (mmHg) -140 mmHg -140 mmHg  --   Venous Pressure (mmHg) 270 mmHg 280 mmHg  --   Transmembrane Pressure (mmHg) 60 mmHg 60 mmHg  --   Ultrafiltration Rate (mL/min) 670 mL/min 670 mL/min  --   Dialysate Flow Rate (mL/min) 600 ml/min 600 ml/min  --   Conductivity: Machine   --  13.9  --   HD Safety Checks Performed Yes Yes  --   Intra-Hemodialysis Comments Progressing as prescribed  --   --     01/04/21 1415 01/04/21 1430  Vitals  BP 122/81 120/82  MAP (mmHg) 94 94  ECG Heart Rate 85 90  Resp 16 18  During Hemodialysis Assessment  Blood Flow Rate (mL/min)  --  200 mL/min  Arterial Pressure (mmHg)  --   --   Venous Pressure (mmHg)  --   --   Transmembrane Pressure (mmHg)  --   --   Ultrafiltration Rate (mL/min)  --   --   Dialysate Flow Rate (mL/min)  --   --   Conductivity: Machine   --   --   HD Safety Checks Performed  --   --   Intra-Hemodialysis Comments  --  Tx completed

## 2021-01-05 NOTE — Progress Notes (Signed)
   01/02/21 1800 01/02/21 1815 01/02/21 1819  Vitals  BP 128/87 (!) 126/93  --   MAP (mmHg) 97 102  --   ECG Heart Rate 84 74  --   Resp 15 14  --   During Hemodialysis Assessment  Blood Flow Rate (mL/min) 300 mL/min 300 mL/min 300 mL/min  Arterial Pressure (mmHg) -110 mmHg -110 mmHg -110 mmHg  Venous Pressure (mmHg) 210 mmHg 210 mmHg 210 mmHg  Transmembrane Pressure (mmHg) 20 mmHg 20 mmHg 20 mmHg  Ultrafiltration Rate (mL/min) 670 mL/min 670 mL/min 670 mL/min  Dialysate Flow Rate (mL/min) 600 ml/min 600 ml/min  --   HD Safety Checks Performed Yes Yes Yes  Intra-Hemodialysis Comments Progressing as prescribed Progressing as prescribed Progressing as prescribed    01/02/21 1824 01/02/21 1830 01/02/21 1845  Vitals  BP  --  (!) 130/103 110/75  MAP (mmHg)  --  111 87  ECG Heart Rate  --  (!) 104 80  Resp  --  (!) 21 17  During Hemodialysis Assessment  Blood Flow Rate (mL/min) 300 mL/min 300 mL/min 300 mL/min  Arterial Pressure (mmHg) -110 mmHg -110 mmHg -110 mmHg  Venous Pressure (mmHg) 210 mmHg 210 mmHg 210 mmHg  Transmembrane Pressure (mmHg) 20 mmHg 20 mmHg 20 mmHg  Ultrafiltration Rate (mL/min) 670 mL/min 670 mL/min 670 mL/min  Dialysate Flow Rate (mL/min)  --   --   --   HD Safety Checks Performed Yes Yes Yes  Intra-Hemodialysis Comments Progressing as prescribed Progressing as prescribed Progressing as prescribed    01/02/21 1900 01/02/21 1915  Vitals  BP 114/70 (!) 120/106  MAP (mmHg) 84 113  ECG Heart Rate 88 91  Resp 19 17  During Hemodialysis Assessment  Blood Flow Rate (mL/min) 300 mL/min 300 mL/min  Arterial Pressure (mmHg) -110 mmHg -110 mmHg  Venous Pressure (mmHg) 210 mmHg 210 mmHg  Transmembrane Pressure (mmHg) 20 mmHg 20 mmHg  Ultrafiltration Rate (mL/min) 670 mL/min 370 mL/min  Dialysate Flow Rate (mL/min)  --   --   HD Safety Checks Performed Yes Yes  Intra-Hemodialysis Comments Progressing as prescribed Tx completed

## 2021-01-05 NOTE — TOC Progression Note (Signed)
Transition of Care [facility]) - Progression Note    Patient Details  Name: Lori [name] MRN: [id number] Date of Birth: 05-30-1971  Transition of Care [facility]) CM/SW [place], LCSW Phone Number: 01/05/2021, 1:59 PM  Clinical Narrative:   Dialysis Coordinator working on chair time in [place] for patient during her short term rehab. Spoke with Ebony Hail at [facility], she reported they should be able to accept patient over the weekend if chair time is not obtained tomorrow. Hoping for chair time and DC to SNF tomorrow.    Expected Discharge Plan: Home/Self Care Barriers to Discharge: Continued Medical Work up  Expected Discharge Plan and Services Expected Discharge Plan: Home/Self Care       Living arrangements for the past 2 months: Single Family Home                                       Social Determinants of Health (SDOH) Interventions    Readmission Risk Interventions Readmission Risk Prevention Plan 01/03/2021 12/14/2020  Medication Screening - Complete  Transportation Screening Complete Complete  PCP or Specialist Appt within 3-5 Days Complete -  HRI or Home Care Consult Complete -  Social Work Consult for [place] Planning/Counseling Complete -  Palliative Care Screening Not Applicable -  Medication Review Press photographer) Complete -  Some recent data might be hidden

## 2021-01-06 LAB — RESP PANEL BY RT-PCR (FLU A&B, COVID) ARPGX2
Influenza A by PCR: NEGATIVE
Influenza B by PCR: NEGATIVE
SARS Coronavirus 2 by RT PCR: POSITIVE — AB

## 2021-01-06 LAB — AEROBIC/ANAEROBIC CULTURE W GRAM STAIN (SURGICAL/DEEP WOUND): Gram Stain: NONE SEEN

## 2021-01-06 NOTE — Progress Notes (Signed)
Occupational Therapy Treatment Patient Details Name: Lori [name] MRN: [id number] DOB: May 11, 1971 Today's Date: 01/06/2021    History of present illness Pt is a 50 y.o. female initially admitted to [facility] 1/19 s/p fall during car transfer after finishing dialysis; also c/o chronic wound on L foot (present about 1 month).  Pt transferred to [facility] 12/20/20 for vascular surgery evaluation of L foot vascular disease.  Pt admitted with L foot wound infection with chronic ischemia of L LE, ESRD on HD MWF, COVID-19 (+) 12/14/20, and paroxysmal a-fib.  1/27 s/p balloon angioplasty, thrombectomy and 2 stents placed to SFA and L popliteal.  1/29 surgical case aborted d/t pt unstable after anesthesia.  1/30 pt noted with a-fib with RVR and transferred to ICU after rapid response (pt in a-fib with RVR and hypotension).  2/4 s/p angio with angioplasty and stent placement.  2/6 s/p amputation L 4th and 5th ray (pt bradycardic and hypotensive end of surgery but responded to treatment).  PMH includes anemia, ASCVD, calciphylaxis, chronic abdominal wound infection, ESRD, Hashimoto thyroiditis, htn, morbid obesity, partial thyroidectomy, CAD s/p stent to LAD.   OT comments  Upon entering the room, pt supine in bed with no c/o pain. Pt reports having "people in and out all day" and upset over sleep hygiene while in hospital. OT encouraged pt to participate in OT intervention and she was agreeable. OT educated and demonstrated use of red, level 2 resistive theraband exercises for B UE strengthening. Pt returning demonstrations with min cuing for proper technique. Pt performed 2 sets of 10 chest pulls, shoulder elevation, shoulder diagonals, and alternating punches with rest breaks as needed. Pt continues to benefit from OT intervention. All needs within reach. SNF continues to be recommended at discharge to address functional deficits.   Follow Up Recommendations  SNF    Equipment Recommendations  Other  (comment) (defer to next venue of care)       Precautions / Restrictions Precautions Precautions: Fall Precaution Comments: B post op shoes Restrictions Weight Bearing Restrictions: Yes Other Position/Activity Restrictions: Heel WB'ing only to L foot per secure messaging with MD Cleda Mccreedy 01/03/21; R 5th MTP joint dry gangrene              ADL either performed or assessed with clinical judgement        Vision Baseline Vision/History: Wears glasses Wears Glasses: At all times Patient Visual Report: No change from baseline            Cognition Arousal/Alertness: Awake/alert Behavior During Therapy: WFL for tasks assessed/performed Overall Cognitive Status: Within Functional Limits for tasks assessed                                 General Comments: A&O x4                   Pertinent Vitals/ Pain       Pain Assessment: No/denies pain         Frequency  Min 2X/week        Progress Toward Goals  OT Goals(current goals can now be found in the care plan section)  Progress towards OT goals: Progressing toward goals  Acute Rehab OT Goals Patient Stated Goal: to return home OT Goal Formulation: With patient Time For Goal Achievement: 01/17/21 Potential to Achieve Goals: Good  Plan Discharge plan remains appropriate       AM-PAC OT "6 Clicks"  Daily Activity     Outcome Measure   Help from another person eating meals?: None Help from another person taking care of personal grooming?: A Little Help from another person toileting, which includes using toliet, bedpan, or urinal?: A Lot Help from another person bathing (including washing, rinsing, drying)?: A Lot Help from another person to put on and taking off regular upper body clothing?: A Little Help from another person to put on and taking off regular lower body clothing?: A Lot 6 Click Score: 16    End of Session    OT Visit Diagnosis: History of falling (Z91.81)   Activity Tolerance  Patient tolerated treatment well   Patient Left in bed;with call bell/phone within reach;with bed alarm set   Nurse Communication Mobility status        Time: [id number] OT Time Calculation (min): 27 min  Charges: OT General Charges $OT Visit: 1 Visit OT Treatments $Therapeutic Exercise: 23-37 mins  Darleen Crocker, MS, OTR/L , CBIS ascom [phone]  01/06/21, 3:30 PM

## 2021-01-06 NOTE — Progress Notes (Signed)
[facility] [place] NAME: Lori [name]    MR#:  [id number]  DATE OF BIRTH:  May 11, 1971  SUBJECTIVE:   Patient overall doing ok. Tolerating well. Denies any complaints. Patient likes to drink her [facility]! REVIEW OF SYSTEMS:   Review of Systems  Constitutional: Negative for chills, fever and weight loss.  HENT: Negative for ear discharge, ear pain and nosebleeds.   Eyes: Negative for blurred vision, pain and discharge.  Respiratory: Negative for sputum production, shortness of breath, wheezing and stridor.   Cardiovascular: Negative for chest pain, palpitations, orthopnea and PND.  Gastrointestinal: Negative for abdominal pain, diarrhea, nausea and vomiting.  Genitourinary: Negative for frequency and urgency.  Musculoskeletal: Positive for back pain. Negative for joint pain.  Neurological: Positive for weakness. Negative for sensory change, speech change and focal weakness.  Psychiatric/Behavioral: Negative for depression and hallucinations. The patient is not nervous/anxious.    Tolerating Diet:yes Tolerating PT: rec rehab  DRUG ALLERGIES:   Allergies  Allergen Reactions  . Activase [Alteplase] Shortness Of Breath  . Bee Pollen Anaphylaxis  . Warfarin Sodium Nausea And Vomiting and Rash    VITALS:  Blood pressure 134/85, pulse 76, temperature 98 F (36.7 C), temperature source Oral, resp. rate 16, height '5\' 3"'$  (1.6 m), weight 116.4 kg, SpO2 (!) 79 %.  PHYSICAL EXAMINATION:   Physical Exam  GENERAL:  50 y.o.-year-old patient lying in the bed with no acute distress. Appears chronically ill. HEENT: Head atraumatic, normocephalic. Oropharynx and nasopharynx clear.  LUNGS: Normal breath sounds bilaterally, no wheezing, rales, rhonchi. No use of accessory muscles of respiration. Right upper chest HD access CARDIOVASCULAR: S1, S2 normal. No murmurs, rubs, or gallops. Irregular rhythm ABDOMEN: Soft, nontender, nondistended. Bowel  sounds present. No organomegaly or mass.  EXTREMITIES: post op dressing +. Fluid filled blister right calf. No erythema NEUROLOGIC: gen weakness-- grossly nonfocal PSYCHIATRIC:  patient is alert and oriented x 3.   LABORATORY PANEL:  CBC Recent Labs  Lab 01/04/21 0527  WBC 14.1*  HGB 7.8*  HCT 24.6*  PLT 318    Chemistries  Recent Labs  Lab 01/04/21 0527  NA 133*  K 4.6  CL 95*  CO2 23  GLUCOSE 78  BUN 36*  CREATININE 6.80*  CALCIUM 8.1*   Cardiac Enzymes No results for input(s): TROPONINI in the last 168 hours. RADIOLOGY:  No results found. ASSESSMENT AND PLAN:    50 y.o.femalewith medical history significant forCAD s/p stent to LAD in 2020, ESRD on HD MWF, hypertension, hypothyroidism, hyperparathyroidism, GERD who was initially admitted to [facility] on 12/14/2020 for a left foot wound. She was started on antibiotics. General surgery evaluated the patient and opined that she may ultimately need a left AKA. Patient wanted to discuss with her vascular surgeon Dr. Delana Meyer before any surgery would be considered. She was transferred to [facility] for further evaluation  Rapid a fib with RVR/hypotension/questionable septic shock in the setting of ESRB and left foot infection --was transferred to ICU on 30 January. she received bolus of amiodarone, digoxin, IV Cardizem drip now off. Levophed discontinued, remains on midodrine. Cardiology following. HR wnl today - cont midodrine - lopressor 25 mg bid - continue asa, was on IV heparin gtt--now changed to po eliquis (on 01/04/21) --cardiology aware of oral anticoagulation - outpt consideration of cardioversion--pt follows with Dr Terrence Dupont  Left foot wound infection with gangrene/chronic ischemia of the left lower extremity, now s/p ray amputation of left 4th  and 5th toes --On admission, necrotic/ischemic appearance of left fourth and fifth toes. transferredfrom [name] for vascular surgery evaluation. Status post  aortogram and left lower extremity angiogram with angioplasty of left peroneal artery/left SFA and popliteal arteries along with mechanical thrombectomy and stent placement of the left SFA and popliteal artery on 12/22/2020 --s/p repeat Angiogram on 2/4 with angioplasty and multiple stents placed. --  s/p ray amputation of left 4th and 5th toes by podiatry on 2/6, immediate post-op course complicated by hypotension. - follow vascular surgery and podiatry recs - Antibiotics finished. received cefepime/flagyl 1/26-1/27, zosyn 1/27-2/1, unasyn 2/1>2/6. Wound culture has grown Enterococcus faecalis and Proteus vulgaris.  - pt/ot advising snf  End-stage renal disease on hemodialysis Hyperkalemia Nephrology following. Initially treated with covid t/t/s schedule, now that covid precautions discontinued has transitioned to mwf schedule. Tolerated dialysis yesterday.  Hyperkalemia resolved w/ dialysis - f/u nephro recs, cont mwf dialysis  Anemia of chronic disease Folic acid deficiency H has slowly downtrended, was 7 yesterday, received 1 u during dialysis, is 7.8 . No melena or other bleeding. Folate low, b12 normal - epo w/ HD - started folic acid  Bronze skin Iron overload Ferritin and tsat markedly elevated, likely 2/2 chronic esrd with blood and iron transfusions. Curbsided nephrology, this can be addressed as outpatient by avoiding future iron administration  COVID-19 positive -Incidentally Covid + 12/14/2020. She has been asymptomatic.  --She states she has been fully vaccinated including booster dose.  --She received 3 days IV remdesivir. Currently no need for further treatment. Precautions discontinued  CAD: s/p multiple stents -Continue aspirin, BB and statin --follows with Dr Terrence Dupont ([facility])  Hypertension: with relative hypotension -cont BB -d/ced midodrine  Hypothyroidism: TSH markedly elevated, pt says was not taking synthroid for several weeks prior to  arrival because pharmacy didn't have her meds - home synthroid resumed, will need repeat TSH in several weeks  Consultants:Vascular surgery/nephrology/podiatry/ cardilogy  Procedures: - aortogram and left lower extremity angiogram with angioplasty of left peroneal artery/left SFA and popliteal arteries along with mechanical thrombectomy and stent placement of the left SFA and popliteal artery on 12/22/2020.  - Repeat angiogram with angioplasty and stent placement 2/4. - ray amputation left 4th and 5th toes 2/6    DVT prophylaxis:apixaban Code Status:Full Family Communication:dter Amy on the phone  Disposition Plan: SNF Status is: Inpatient     Dispo: The patient is from: Home Anticipated d/c is to: SNF Anticipated d/c date is:  To [facility] in [place]  and patient has outpatient dialysis chair time still pending!! Per TOC likely will not get it till Monday patient currently is medically best at baseline and ok  to d/c. Difficult to place patient No    Level of care: Med-Surg Status is: Inpatient       TOTAL TIME TAKING CARE OF THIS PATIENT: *25* minutes.  >50% time spent on counselling and coordination of care  Note: This dictation was prepared with Dragon dictation along with smaller phrase technology. Any transcriptional errors that result from this process are unintentional.  Fritzi Mandes M.D    [facility]   CC: Primary care physician; Patient, No Pcp PerPatient ID: Doroteo Bradford, female   DOB: 12/14/1970, 50 y.o.   MRN: [id number]

## 2021-01-06 NOTE — Progress Notes (Signed)
Physical Therapy Treatment Patient Details Name: Lori [name] MRN: [id number] DOB: September 16, 1971 Today's Date: 01/06/2021    History of Present Illness Pt is a 50 y.o. female initially admitted to [facility] 1/19 s/p fall during car transfer after finishing dialysis; also c/o chronic wound on L foot (present about 1 month).  Pt transferred to [facility] 12/20/20 for vascular surgery evaluation of L foot vascular disease.  Pt admitted with L foot wound infection with chronic ischemia of L LE, ESRD on HD MWF, COVID-19 (+) 12/14/20, and paroxysmal a-fib.  1/27 s/p balloon angioplasty, thrombectomy and 2 stents placed to SFA and L popliteal.  1/29 surgical case aborted d/t pt unstable after anesthesia.  1/30 pt noted with a-fib with RVR and transferred to ICU after rapid response (pt in a-fib with RVR and hypotension).  2/4 s/p angio with angioplasty and stent placement.  2/6 s/p amputation L 4th and 5th ray (pt bradycardic and hypotensive end of surgery but responded to treatment).  PMH includes anemia, ASCVD, calciphylaxis, chronic abdominal wound infection, ESRD, Hashimoto thyroiditis, htn, morbid obesity, partial thyroidectomy, CAD s/p stent to LAD.    PT Comments    Attempted to see pt earlier this morning but pt had just received breakfast tray so therapist returned to see pt later in morning.  Pt sleeping in bed upon PT arrival (2nd attempt); pt woken with vc's.  Pt requesting only bed level ex's today d/t being tired.  Blister noted back of pt's R calf--MD Posey Pronto notified and came to assess.  Pt fatigued with B LE ex's in bed.  D/t pt's impaired activity tolerance, PT frequency recommendations updated to min 2x/week.  Will continue to focus on strengthening and progressive functional mobility per pt tolerance.   Follow Up Recommendations  SNF     Equipment Recommendations  Rolling walker with 5" wheels;3in1 (PT);Wheelchair (measurements PT);Wheelchair cushion (measurements PT);Hospital bed     Recommendations for Other Services OT consult     Precautions / Restrictions Precautions Precautions: Fall Precaution Comments: B post op shoes Restrictions Weight Bearing Restrictions: Yes LLE Weight Bearing: Partial weight bearing Other Position/Activity Restrictions: Heel WB'ing only to L foot per secure messaging with MD Cleda Mccreedy 01/03/21; R 5th MTP joint dry gangrene    Mobility  Bed Mobility Overal bed mobility:  (Pt declined OOB mobility d/t fatigue)                  Transfers                    Ambulation/Gait                 Stairs             Wheelchair Mobility    Modified Rankin (Stroke Patients Only)       Balance                                            Cognition Arousal/Alertness: Awake/alert Behavior During Therapy: WFL for tasks assessed/performed Overall Cognitive Status: Within Functional Limits for tasks assessed                                 General Comments: A&O x4      Exercises Total Joint Exercises Ankle Circles/Pumps: AROM;Strengthening;Both;Supine (10 reps x2) Quad Sets:  AROM;Strengthening;Both;Supine (10 reps x2) Short Arc Quad: AAROM;Strengthening;Both;Supine (10 reps x2) Heel Slides: AAROM;Strengthening;Both;Supine (10 reps x2) Hip ABduction/ADduction: AAROM;Strengthening;Both;Supine (10 reps x2)    General Comments General comments (skin integrity, edema, etc.): L foot in bandage      Pertinent Vitals/Pain Pain Assessment: No/denies pain Pain Intervention(s): Limited activity within patient's tolerance;Monitored during session;Repositioned  Vitals (HR and O2 on room air) stable and WFL throughout treatment session.    Home Living                      Prior Function            PT Goals (current goals can now be found in the care plan section) Acute Rehab PT Goals Patient Stated Goal: to return home PT Goal Formulation: With patient Time For Goal  Achievement: 01/17/21 Potential to Achieve Goals: Fair Progress towards PT goals: Not progressing toward goals - comment (pt requesting only bed level ex's today)    Frequency    Min 2X/week      PT Plan Frequency needs to be updated    Co-evaluation              AM-PAC PT "6 Clicks" Mobility   Outcome Measure  Help needed turning from your back to your side while in a flat bed without using bedrails?: A Lot Help needed moving from lying on your back to sitting on the side of a flat bed without using bedrails?: A Lot Help needed moving to and from a bed to a chair (including a wheelchair)?: Total Help needed standing up from a chair using your arms (e.g., wheelchair or bedside chair)?: Total Help needed to walk in hospital room?: Total Help needed climbing 3-5 steps with a railing? : Total 6 Click Score: 8    End of Session   Activity Tolerance: Patient limited by fatigue Patient left: in bed;with call bell/phone within reach;with bed alarm set;Other (comment) (L LE elevated on pillow with heel floating) Nurse Communication: Mobility status;Precautions PT Visit Diagnosis: Other abnormalities of gait and mobility (R26.89);Muscle weakness (generalized) (M62.81);History of falling (Z91.81);Difficulty in walking, not elsewhere classified (R26.2);Pain Pain - Right/Left: Left Pain - part of body: Ankle and joints of foot     Time: [id number] PT Time Calculation (min) (ACUTE ONLY): 32 min  Charges:  $Therapeutic Exercise: 23-37 mins                    Leitha Bleak, PT 01/06/21, 11:40 AM

## 2021-01-06 NOTE — TOC Progression Note (Addendum)
Transition of Care [facility]) - Progression Note    Patient Details  Name: Lori [name] MRN: [id number] Date of Birth: 13-Dec-1970  Transition of Care [facility]) CM/SW [place], LCSW Phone Number: 01/06/2021, 8:48 AM  Clinical Narrative:   CSW informed by Dialysis Coordinator Estill Bamberg that patient will not be able to switch dialysis centers until Monday at the earliest. Patient will have to wait to do this until she can DC to [facility] for SNF rehab.  10:00- Patient was positive for COVID, however this is due to having COVID in January. Confirmed with [facility] that they would consider this COVID recovered and can still accept patient once dialysis transfer is done. Updated Dialysis Coordinator [place].   Expected Discharge Plan: Home/Self Care Barriers to Discharge: Continued Medical Work up  Expected Discharge Plan and Services Expected Discharge Plan: Home/Self Care       Living arrangements for the past 2 months: Single Family Home                                       Social Determinants of Health (SDOH) Interventions    Readmission Risk Interventions Readmission Risk Prevention Plan 01/03/2021 12/14/2020  Medication Screening - Complete  Transportation Screening Complete Complete  PCP or Specialist Appt within 3-5 Days Complete -  HRI or Home Care Consult Complete -  Social Work Consult for [place] Planning/Counseling Complete -  Palliative Care Screening Not Applicable -  Medication Review Press photographer) Complete -  Some recent data might be hidden

## 2021-01-06 NOTE — Progress Notes (Signed)
Patient was positive for Covid 19 on 12/14/20. Retesting within 90 days is not recommended as many patients will continue to test positive via PCR within that timeframe, but that does not reflect infectivity. This patient does not require isolation for Covid since she will not be reinfected within 3 months of her first infection.

## 2021-01-06 NOTE — Progress Notes (Signed)
[facility], [place] 01/06/21  Subjective:   Patient is doing well today. Rested comfortable during the night Weaned to room air yesterday No complaints of shortness of breath Currently fatigued from hair care Unable to eat breakfast due to fatigue. Denies nausea  Objective:  Vital signs in last 24 hours:  Temp:  [97.6 F (36.4 C)-98.1 F (36.7 C)] 98 F (36.7 C) (02/11 0823) Pulse Rate:  [65-84] 76 (02/11 0823) Resp:  [16-18] 16 (02/11 0823) BP: (112-142)/(81-116) 134/85 (02/11 0823) SpO2:  [79 %-100 %] 79 % (02/11 0823)  Weight change:  Filed Weights   12/29/20 2030 12/30/20 0359 12/31/20 0433  Weight: 113.8 kg 113.5 kg 116.4 kg    Intake/Output:    Intake/Output Summary (Last 24 hours) at 01/06/2021 1137 Last data filed at 01/06/2021 0400 Gross per 24 hour  Intake 130 ml  Output 0 ml  Net 130 ml     Physical Exam: General: Resting in bed, NAD  HEENT  normocephalic, atraumatic  Pulm/lungs Clear bilaterally  CVS/Heart  S1-S2 present, irregular rhythm  Abdomen:  soft, nondistended, nontender  Extremities: No peripheral edema  Neurologic:  Alert, able to answer questions   Skin:  No acute lesions or rashes  Access: RIJ permcath       Basic Metabolic Panel:  Recent Labs  Lab 12/30/20 1148 12/31/20 1050 01/01/21 1238 01/02/21 0512 01/03/21 0438 01/04/21 0527  NA 138 136 138 138 136 133*  K 4.5 5.1 4.2 4.5 4.1 4.6  CL 97* 96* 101 99 97* 95*  CO2 '23 22 23 25 26 23  '$ GLUCOSE 94 98 87 100* 87 78  BUN 39* 51* 39* 42* 27* 36*  CREATININE 7.16* 8.37* 6.54* 7.33* 5.33* 6.80*  CALCIUM 9.0 9.0 8.3* 8.4* 8.2* 8.1*  PHOS 4.5  --   --   --   --   --      CBC: Recent Labs  Lab 12/31/20 1050 01/01/21 1238 01/02/21 0512 01/03/21 0438 01/04/21 0527  WBC 17.8* 17.7* 16.0* 15.4* 14.1*  HGB 7.6* 6.7* 7.0* 7.7* 7.8*  HCT 24.6* 22.1* 22.7* 24.4* 24.6*  MCV 100.0 103.3* 102.3* 99.6 99.6  PLT 341 289 320 279 318      Lab  Results  Component Value Date   HEPBSAG NON REACTIVE 12/25/2020      Microbiology:  Recent Results (from the past 240 hour(s))  Aerobic/Anaerobic Culture (surgical/deep wound)     Status: None (Preliminary result)   Collection Time: 01/01/21  9:24 AM   Specimen: PATH Other; Tissue  Result Value Ref Range Status   Specimen Description   Final    TOE LEFT Performed at [facility], [address], [place], [place] [address]    Special Requests   Final    NONE Performed at [facility], [address] [place], [place] [address]    Gram Stain   Final    NO WBC SEEN NO ORGANISMS SEEN Performed at [facility], [place] [address], [place], [place] [address]    Culture   Final    RARE STENOTROPHOMONAS MALTOPHILIA NO ANAEROBES ISOLATED; CULTURE IN PROGRESS FOR 5 DAYS    Report Status PENDING  Incomplete   Organism ID, Bacteria STENOTROPHOMONAS MALTOPHILIA  Final      Susceptibility   Stenotrophomonas maltophilia - MIC*    LEVOFLOXACIN 1 SENSITIVE Sensitive     TRIMETH/SULFA <=20 SENSITIVE Sensitive     * RARE STENOTROPHOMONAS MALTOPHILIA  Resp Panel by RT-PCR (  Flu A&B, Covid) Nasopharyngeal Swab     Status: Abnormal   Collection Time: 01/05/21  8:00 AM   Specimen: Nasopharyngeal Swab; Nasopharyngeal(NP) swabs in vial transport medium  Result Value Ref Range Status   SARS Coronavirus 2 by RT PCR POSITIVE (A) NEGATIVE Final    Comment: RESULT CALLED TO, READ BACK BY AND VERIFIED WITH:  IBME AJIBADE AT [id number] 01/06/21 SDR (NOTE) SARS-CoV-2 target nucleic acids are DETECTED.  The SARS-CoV-2 RNA is generally detectable in upper respiratory specimens during the acute phase of infection. Positive results are indicative of the presence of the identified virus, but do not rule out bacterial infection or co-infection with other pathogens not detected by the test. Clinical correlation with patient history and other diagnostic information is necessary to determine  patient infection status. The expected result is Negative.  Fact Sheet for Patients: EntrepreneurPulse.com.au  Fact Sheet for Healthcare Providers: IncredibleEmployment.be  This test is not yet approved or cleared by the [place] FDA and  has been authorized for detection and/or diagnosis of SARS-CoV-2 by FDA under an Emergency Use Authorization (EUA).  This EUA will remain in effect (meaning this test can be  used) for the duration of  the COVID-19 declaration under Section 564(b)(1) of the Act, 21 U.S.C. section 360bbb-3(b)(1), unless the authorization is terminated or revoked sooner.     Influenza A by PCR NEGATIVE NEGATIVE Final   Influenza B by PCR NEGATIVE NEGATIVE Final    Comment: (NOTE) The Xpert Xpress SARS-CoV-2/FLU/RSV plus assay is intended as an aid in the diagnosis of influenza from Nasopharyngeal swab specimens and should not be used as a sole basis for treatment. Nasal washings and aspirates are unacceptable for Xpert Xpress SARS-CoV-2/FLU/RSV testing.  Fact Sheet for Patients: EntrepreneurPulse.com.au  Fact Sheet for Healthcare Providers: IncredibleEmployment.be  This test is not yet approved or cleared by the [place] FDA and has been authorized for detection and/or diagnosis of SARS-CoV-2 by FDA under an Emergency Use Authorization (EUA). This EUA will remain in effect (meaning this test can be used) for the duration of the COVID-19 declaration under Section 564(b)(1) of the Act, 21 U.S.C. section 360bbb-3(b)(1), unless the authorization is terminated or revoked.  Performed at [facility], [address] [place], [place] [address]     Coagulation Studies: No results for input(s): LABPROT, INR in the last 72 hours.  Urinalysis: No results for input(s): COLORURINE, LABSPEC, PHURINE, GLUCOSEU, HGBUR, BILIRUBINUR, KETONESUR, PROTEINUR, UROBILINOGEN, NITRITE,  LEUKOCYTESUR in the last 72 hours.  Invalid input(s): APPERANCEUR    Imaging: No results found.   Medications:   . sodium chloride Stopped (01/06/21 0419)   . acetaminophen  650 mg Oral Q8H  . apixaban  5 mg Oral BID  . aspirin EC  81 mg Oral Daily  . atorvastatin  40 mg Oral Daily  . Chlorhexidine Gluconate Cloth  6 each Topical Daily  . epoetin (EPOGEN/PROCRIT) injection  8,000 Units Intravenous Q T,Th,Sa-HD  . folic acid  1 mg Oral Daily  . gabapentin  100 mg Oral QHS  . levothyroxine  300 mcg Oral QHS  . metoprolol tartrate  25 mg Oral BID  . pantoprazole  40 mg Oral QHS  . polyethylene glycol  17 g Oral Daily  . senna-docusate  2 tablet Oral QHS  . sevelamer carbonate  3,200 mg Oral TID WC  . sodium chloride flush  10-40 mL Intracatheter Q12H   albuterol, hydrALAZINE, HYDROcodone-acetaminophen, morphine injection, ondansetron **OR** ondansetron (ZOFRAN) IV, ondansetron (ZOFRAN) IV,  sevelamer carbonate, sodium chloride flush  Assessment/ Plan:  50 y.o. female with  end stage renal disease on hemodialysis, coronary artery disease, hypertension, peripheral vascular disease, hypothyroidismwas admitted on 12/20/2020 for Cellulitis of fourth toe of left foot [L03.032]   Principal Problem:   Cellulitis of fourth toe of left foot Active Problems:   ESRD on dialysis [facility])   Hypothyroidism   Atrial fibrillation ([place])   Coronary artery disease involving native coronary artery of native heart without angina pectoris   PAD (peripheral artery disease) ([place])  [facility] ([place]) MWF [facility] RIJ permcath  #. ESRD:  Scheduled for dialysis today, afternoon shift. Continued d/c planning for SNF and outpatient HD   #Covid 19 infection Completed all treatments related to covid Currently on room air Initially diagnosed on 12/14/2020   #. Anemia of CKD Will continue to monitor. Lab Results  Component Value Date   HGB 7.8 (L) 01/04/2021  EPO given during  HD   #. Secondary hyperparathyroidism of renal origin N 25.81  Sevelamer prescribed TID    Component Value Date/Time   PTH 312.5 (H) 03/27/2014 0843   Lab Results  Component Value Date   PHOS 4.5 12/30/2020   Will monitor labs closely  #. Peripheral vascular disease: with ischemic changes Continues to heal from procedure Surgical dressing changed daily  Surgery team managing this care  #Afib with RVR Controlled on prescribed medications at this time Cardioversion planned for outpatient setting   LOS: [place], NP 2/11/202211:37 AM

## 2021-01-07 DIAGNOSIS — R234 Changes in skin texture: Secondary | ICD-10-CM

## 2021-01-07 MED ORDER — BISACODYL 10 MG RE SUPP
10.0000 mg | Freq: Once | RECTAL | Status: AC
  Administered 2021-01-07: 10 mg via RECTAL
  Filled 2021-01-07: qty 1

## 2021-01-07 NOTE — Progress Notes (Signed)
[facility]  ROUNDING NOTE   Subjective:   Patient denies any issues with HD yesterday. Reports she has been eating well and deneis any shortness of breath, edema. Has been feeling constipated.   Objective:  Vital signs in last 24 hours:  Temp:  [97.4 F (36.3 C)-98.1 F (36.7 C)] 97.7 F (36.5 C) (02/12 1225) Pulse Rate:  [49-102] 91 (02/12 1225) Resp:  [13-21] 16 (02/12 1225) BP: (107-143)/(78-105) 124/105 (02/12 1225) SpO2:  [84 %-100 %] 100 % (02/12 1225)  Weight change:  Filed Weights   12/29/20 2030 12/30/20 0359 12/31/20 0433  Weight: 113.8 kg 113.5 kg 116.4 kg    Intake/Output: I/O last 3 completed shifts: In: 130 [P.O.:120; I.V.:10] Out: 1500 [Other:1500]   Intake/Output this shift:  Total I/O In: 240 [P.O.:240] Out: -   Physical Exam: General: NAD,   Head: Normocephalic, atraumatic. Moist oral mucosal membranes  Eyes: Anicteric, PERRL  Neck: Supple, trachea midline  Lungs:  Clear to auscultation  Heart: Regular rate and rhythm  Abdomen:  Soft, nontender,   Extremities:  trace peripheral edema left LE.   Neurologic: Nonfocal, moving all four extremities  Skin: No lesions  Access: Right IJ permcath     Basic Metabolic Panel: Recent Labs  Lab 01/01/21 1238 01/02/21 0512 01/03/21 0438 01/04/21 0527  NA 138 138 136 133*  K 4.2 4.5 4.1 4.6  CL 101 99 97* 95*  CO2 '23 25 26 23  '$ GLUCOSE 87 100* 87 78  BUN 39* 42* 27* 36*  CREATININE 6.54* 7.33* 5.33* 6.80*  CALCIUM 8.3* 8.4* 8.2* 8.1*    Liver Function Tests: No results for input(s): AST, ALT, ALKPHOS, BILITOT, PROT, ALBUMIN in the last 168 hours. No results for input(s): LIPASE, AMYLASE in the last 168 hours. No results for input(s): AMMONIA in the last 168 hours.  CBC: Recent Labs  Lab 01/01/21 1238 01/02/21 0512 01/03/21 0438 01/04/21 0527  WBC 17.7* 16.0* 15.4* 14.1*  HGB 6.7* 7.0* 7.7* 7.8*  HCT 22.1* 22.7* 24.4* 24.6*  MCV 103.3* 102.3* 99.6 99.6  PLT 289 320 279  318    Cardiac Enzymes: No results for input(s): CKTOTAL, CKMB, CKMBINDEX, TROPONINI in the last 168 hours.  BNP: Invalid input(s): POCBNP  CBG: No results for input(s): GLUCAP in the last 168 hours.  Microbiology: Results for orders placed or performed during the hospital encounter of 12/20/20  Aerobic/Anaerobic Culture (surgical/deep wound)     Status: None   Collection Time: 01/01/21  9:24 AM   Specimen: PATH Other; Tissue  Result Value Ref Range Status   Specimen Description   Final    TOE LEFT Performed at [facility], [address], [place], [place] [address]    Special Requests   Final    NONE Performed at [facility], [address] [place], [place] [address]    Gram Stain NO WBC SEEN NO ORGANISMS SEEN   Final   Culture   Final    RARE STENOTROPHOMONAS MALTOPHILIA NO ANAEROBES ISOLATED Performed at [facility], [place] [address] [place],  [address]    Report Status 01/06/2021 FINAL  Final   Organism ID, Bacteria STENOTROPHOMONAS MALTOPHILIA  Final      Susceptibility   Stenotrophomonas maltophilia - MIC*    LEVOFLOXACIN 1 SENSITIVE Sensitive     TRIMETH/SULFA <=20 SENSITIVE Sensitive     * RARE STENOTROPHOMONAS MALTOPHILIA  Resp Panel by RT-PCR (Flu A&B, Covid) Nasopharyngeal Swab     Status: Abnormal  Collection Time: 01/05/21  8:00 AM   Specimen: Nasopharyngeal Swab; Nasopharyngeal(NP) swabs in vial transport medium  Result Value Ref Range Status   SARS Coronavirus 2 by RT PCR POSITIVE (A) NEGATIVE Final    Comment: RESULT CALLED TO, READ BACK BY AND VERIFIED WITH:  IBME AJIBADE AT [id number] 01/06/21 SDR (NOTE) SARS-CoV-2 target nucleic acids are DETECTED.  The SARS-CoV-2 RNA is generally detectable in upper respiratory specimens during the acute phase of infection. Positive results are indicative of the presence of the identified virus, but do not rule out bacterial infection or co-infection with other pathogens  not detected by the test. Clinical correlation with patient history and other diagnostic information is necessary to determine patient infection status. The expected result is Negative.  Fact Sheet for Patients: EntrepreneurPulse.com.au  Fact Sheet for Healthcare Providers: IncredibleEmployment.be  This test is not yet approved or cleared by the [place] FDA and  has been authorized for detection and/or diagnosis of SARS-CoV-2 by FDA under an Emergency Use Authorization (EUA).  This EUA will remain in effect (meaning this test can be  used) for the duration of  the COVID-19 declaration under Section 564(b)(1) of the Act, 21 U.S.C. section 360bbb-3(b)(1), unless the authorization is terminated or revoked sooner.     Influenza A by PCR NEGATIVE NEGATIVE Final   Influenza B by PCR NEGATIVE NEGATIVE Final    Comment: (NOTE) The Xpert Xpress SARS-CoV-2/FLU/RSV plus assay is intended as an aid in the diagnosis of influenza from Nasopharyngeal swab specimens and should not be used as a sole basis for treatment. Nasal washings and aspirates are unacceptable for Xpert Xpress SARS-CoV-2/FLU/RSV testing.  Fact Sheet for Patients: EntrepreneurPulse.com.au  Fact Sheet for Healthcare Providers: IncredibleEmployment.be  This test is not yet approved or cleared by the [place] FDA and has been authorized for detection and/or diagnosis of SARS-CoV-2 by FDA under an Emergency Use Authorization (EUA). This EUA will remain in effect (meaning this test can be used) for the duration of the COVID-19 declaration under Section 564(b)(1) of the Act, 21 U.S.C. section 360bbb-3(b)(1), unless the authorization is terminated or revoked.  Performed at [facility], [address] [place], [place] [address]     Coagulation Studies: No results for input(s): LABPROT, INR in the last 72 hours.  Urinalysis: No  results for input(s): COLORURINE, LABSPEC, PHURINE, GLUCOSEU, HGBUR, BILIRUBINUR, KETONESUR, PROTEINUR, UROBILINOGEN, NITRITE, LEUKOCYTESUR in the last 72 hours.  Invalid input(s): APPERANCEUR    Imaging: No results found.   Medications:   . sodium chloride Stopped (01/06/21 0419)   . acetaminophen  650 mg Oral Q8H  . apixaban  5 mg Oral BID  . aspirin EC  81 mg Oral Daily  . atorvastatin  40 mg Oral Daily  . Chlorhexidine Gluconate Cloth  6 each Topical Daily  . epoetin (EPOGEN/PROCRIT) injection  8,000 Units Intravenous Q T,Th,Sa-HD  . folic acid  1 mg Oral Daily  . gabapentin  100 mg Oral QHS  . levothyroxine  300 mcg Oral QHS  . metoprolol tartrate  25 mg Oral BID  . pantoprazole  40 mg Oral QHS  . polyethylene glycol  17 g Oral Daily  . senna-docusate  2 tablet Oral QHS  . sevelamer carbonate  3,200 mg Oral TID WC  . sodium chloride flush  10-40 mL Intracatheter Q12H   albuterol, hydrALAZINE, HYDROcodone-acetaminophen, morphine injection, ondansetron **OR** ondansetron (ZOFRAN) IV, ondansetron (ZOFRAN) IV, sevelamer carbonate, sodium chloride flush  Assessment/ Plan:  Lori [name]  is a 50 y.o.  female end stage renal disease on hemodialysis, coronary artery disease, hypertension, peripheral vascular disease, hypothyroidismwas admitted on 12/20/2020 for Cellulitis of fourth toe of left foot [L03.032]   [facility] ([place]) MWF [facility] RIJ permcath  1. ESRD:  Currently receiving HD on M,W,F  Continued d/c planning for SNF and outpatient HD  2.Covid 19 infection diagnosed on 12/14/20 Completed three treatments of Remdesivir. asympomtic  No longer on precautions.  Currently on room air  3. Anemia of CKD - Epo given during HD  - Hgb 7.8 - will need repeat CBC at next dialysis treatment.   4.. Secondary hyperparathyroidism of renal origin N 25.81  Sevelamer TID with meals   5. Cellulitis of fourth toe on left foot s/p ray amputation of  left 4th and 5th toes.. Continues to heal from procedure Surgical dressing changed daily  Surgery team managing this care Following with Vascular as well , s/p aortogram and left lower extremity angiogram with angioplasty   6. Afib with RVR Controlled on lopressor at this time Cardioversion planned for outpatient setting     LOS: [place] 2/12/202212:30 PM

## 2021-01-07 NOTE — Progress Notes (Signed)
[facility] [place] NAME: Lori [name]    MR#:  [id number]  DATE OF BIRTH:  1970/12/02  SUBJECTIVE:   Patient overall doing ok. Tolerating well. Denies any complaints.  REVIEW OF SYSTEMS:   Review of Systems  Constitutional: Negative for chills, fever and weight loss.  HENT: Negative for ear discharge, ear pain and nosebleeds.   Eyes: Negative for blurred vision, pain and discharge.  Respiratory: Negative for sputum production, shortness of breath, wheezing and stridor.   Cardiovascular: Negative for chest pain, palpitations, orthopnea and PND.  Gastrointestinal: Negative for abdominal pain, diarrhea, nausea and vomiting.  Genitourinary: Negative for frequency and urgency.  Musculoskeletal: Positive for back pain. Negative for joint pain.  Neurological: Positive for weakness. Negative for sensory change, speech change and focal weakness.  Psychiatric/Behavioral: Negative for depression and hallucinations. The patient is not nervous/anxious.    Tolerating Diet:yes Tolerating PT: rec rehab  DRUG ALLERGIES:   Allergies  Allergen Reactions  . Activase [Alteplase] Shortness Of Breath  . Bee Pollen Anaphylaxis  . Warfarin Sodium Nausea And Vomiting and Rash    VITALS:  Blood pressure (!) 124/105, pulse 91, temperature 97.7 F (36.5 C), resp. rate 16, height '5\' 3"'$  (1.6 m), weight 116.4 kg, SpO2 100 %.  PHYSICAL EXAMINATION:   Physical Exam  GENERAL:  50 y.o.-year-old patient lying in the bed with no acute distress. Appears chronically ill. HEENT: Head atraumatic, normocephalic. Oropharynx and nasopharynx clear.  LUNGS: Normal breath sounds bilaterally, no wheezing, rales, rhonchi. No use of accessory muscles of respiration. Right upper chest HD access CARDIOVASCULAR: S1, S2 normal. No murmurs, rubs, or gallops. Irregular rhythm ABDOMEN: Soft, nontender, nondistended. Bowel sounds present. No organomegaly or mass.  EXTREMITIES: post  op dressing +. Fluid filled blister right calf. No erythema NEUROLOGIC: gen weakness-- grossly nonfocal PSYCHIATRIC:  patient is alert and oriented x 3.   LABORATORY PANEL:  CBC Recent Labs  Lab 01/04/21 0527  WBC 14.1*  HGB 7.8*  HCT 24.6*  PLT 318    Chemistries  Recent Labs  Lab 01/04/21 0527  NA 133*  K 4.6  CL 95*  CO2 23  GLUCOSE 78  BUN 36*  CREATININE 6.80*  CALCIUM 8.1*   Cardiac Enzymes No results for input(s): TROPONINI in the last 168 hours. RADIOLOGY:  No results found. ASSESSMENT AND PLAN:    50 y.o.femalewith medical history significant forCAD s/p stent to LAD in 2020, ESRD on HD MWF, hypertension, hypothyroidism, hyperparathyroidism, GERD who was initially admitted to [facility] on 12/14/2020 for a left foot wound. She was started on antibiotics. General surgery evaluated the patient and opined that she may ultimately need a left AKA. Patient wanted to discuss with her vascular surgeon Dr. Delana Meyer before any surgery would be considered. She was transferred to [facility] for further evaluation  Rapid a fib with RVR/hypotension/questionable septic shock in the setting of ESRB and left foot infection --was transferred to ICU on 30 January. she received bolus of amiodarone, digoxin, IV Cardizem drip now off. Levophed discontinued, remains on midodrine. Cardiology following. HR wnl today - cont midodrine - lopressor 25 mg bid - continue asa, was on IV heparin gtt--now changed to po eliquis (on 01/04/21) --cardiology aware of oral anticoagulation - outpt consideration of cardioversion--pt follows with Dr Terrence Dupont  Left foot wound infection with gangrene/chronic ischemia of the left lower extremity, now s/p ray amputation of left 4th and 5th toes --On admission, necrotic/ischemic appearance of left  fourth and fifth toes. transferredfrom [name] for vascular surgery evaluation. Status post aortogram and left lower extremity angiogram with angioplasty  of left peroneal artery/left SFA and popliteal arteries along with mechanical thrombectomy and stent placement of the left SFA and popliteal artery on 12/22/2020 --s/p repeat Angiogram on 2/4 with angioplasty and multiple stents placed. --  s/p ray amputation of left 4th and 5th toes by podiatry on 2/6, immediate post-op course complicated by hypotension. - follow vascular surgery and podiatry recs - Antibiotics finished. received cefepime/flagyl 1/26-1/27, zosyn 1/27-2/1, unasyn 2/1>2/6. Wound culture has grown Enterococcus faecalis and Proteus vulgaris.  - pt/ot advising snf  End-stage renal disease on hemodialysis Hyperkalemia Nephrology following. Initially treated with covid t/t/s schedule, now that covid precautions discontinued has transitioned to mwf schedule. Tolerated dialysis yesterday.  Hyperkalemia resolved w/ dialysis - f/u nephro recs, cont mwf dialysis  Anemia of chronic disease Folic acid deficiency H has slowly downtrended, was 7 yesterday, received 1 u during dialysis, is 7.8 . No melena or other bleeding. Folate low, b12 normal - epo w/ HD - started folic acid  Bronze skin Iron overload Ferritin and tsat markedly elevated, likely 2/2 chronic esrd with blood and iron transfusions. Curbsided nephrology, this can be addressed as outpatient by avoiding future iron administration  COVID-19 positive -Incidentally Covid + 12/14/2020. She has been asymptomatic.  --She states she has been fully vaccinated including booster dose.  --She received 3 days IV remdesivir. Currently no need for further treatment. Precautions discontinued  CAD: s/p multiple stents -Continue aspirin, BB and statin --follows with Dr Terrence Dupont ([facility])  Hypertension: with relative hypotension -cont BB -d/ced midodrine  Hypothyroidism: TSH markedly elevated, pt says was not taking synthroid for several weeks prior to arrival because pharmacy didn't have her meds - home synthroid  resumed, will need repeat TSH in several weeks  Consultants:Vascular surgery/nephrology/podiatry/ cardilogy  Procedures: - aortogram and left lower extremity angiogram with angioplasty of left peroneal artery/left SFA and popliteal arteries along with mechanical thrombectomy and stent placement of the left SFA and popliteal artery on 12/22/2020.  - Repeat angiogram with angioplasty and stent placement 2/4. - ray amputation left 4th and 5th toes 2/6    DVT prophylaxis:apixaban Code Status:Full Family Communication:dter Amy on the phone  Disposition Plan: SNF Status is: Inpatient     Dispo: The patient is from: Home Anticipated d/c is to: SNF Anticipated d/c date is:   [facility] [place]  and patient has outpatient dialysis chair time still pending! Per TOC likely will not get it till Monday--is the only  BARRIER to discharge patient currently is medically best at baseline and ok  to d/c.     Level of care: Med-Surg Status is: Inpatient       TOTAL TIME TAKING CARE OF THIS PATIENT: *25* minutes.  >50% time spent on counselling and coordination of care  Note: This dictation was prepared with Dragon dictation along with smaller phrase technology. Any transcriptional errors that result from this process are unintentional.  Fritzi Mandes M.D    [facility]   CC: Primary care physician; Patient, No Pcp PerPatient ID: Lori [name], female   DOB: 1971-11-23, 50 y.o.   MRN: [id number]

## 2021-01-08 MED ORDER — METHOCARBAMOL 500 MG PO TABS
500.0000 mg | ORAL_TABLET | Freq: Two times a day (BID) | ORAL | Status: DC | PRN
  Administered 2021-01-08 – 2021-01-09 (×2): 500 mg via ORAL
  Filled 2021-01-08 (×2): qty 1

## 2021-01-08 NOTE — Progress Notes (Signed)
[facility]  ROUNDING NOTE   Subjective:   Patient reports that she has been having some muscle cramping. Eating well. Denies any pain in foot, shortness of breath, edema.   Objective:  Vital signs in last 24 hours:  Temp:  [97.4 F (36.3 C)-98.6 F (37 C)] 97.6 F (36.4 C) (02/13 0754) Pulse Rate:  [87-101] 95 (02/13 0754) Resp:  [16-19] 16 (02/13 0754) BP: (127-146)/(81-112) 135/95 (02/13 0754) SpO2:  [95 %-100 %] 96 % (02/13 0754)  Weight change:  Filed Weights   12/29/20 2030 12/30/20 0359 12/31/20 0433  Weight: 113.8 kg 113.5 kg 116.4 kg    Intake/Output: I/O last 3 completed shifts: In: 240 [P.O.:240] Out: -    Intake/Output this shift:  No intake/output data recorded.  Physical Exam: General: NAD, sitting up in bed   Head: Normocephalic, atraumatic. Moist oral mucosal membranes  Eyes: Anicteric, PERRL  Neck: Supple, trachea midline  Lungs:  Clear to auscultation  Heart: Regular rate and rhythm  Abdomen:  Soft, nontender,   Extremities:  No peripheral edema.  Neurologic: Nonfocal, moving all four extremities  Skin: No lesions  Access: Right IJ permcath     Basic Metabolic Panel: Recent Labs  Lab 01/01/21 1238 01/02/21 0512 01/03/21 0438 01/04/21 0527  NA 138 138 136 133*  K 4.2 4.5 4.1 4.6  CL 101 99 97* 95*  CO2 '23 25 26 23  '$ GLUCOSE 87 100* 87 78  BUN 39* 42* 27* 36*  CREATININE 6.54* 7.33* 5.33* 6.80*  CALCIUM 8.3* 8.4* 8.2* 8.1*    Liver Function Tests: No results for input(s): AST, ALT, ALKPHOS, BILITOT, PROT, ALBUMIN in the last 168 hours. No results for input(s): LIPASE, AMYLASE in the last 168 hours. No results for input(s): AMMONIA in the last 168 hours.  CBC: Recent Labs  Lab 01/01/21 1238 01/02/21 0512 01/03/21 0438 01/04/21 0527  WBC 17.7* 16.0* 15.4* 14.1*  HGB 6.7* 7.0* 7.7* 7.8*  HCT 22.1* 22.7* 24.4* 24.6*  MCV 103.3* 102.3* 99.6 99.6  PLT 289 320 279 318    Cardiac Enzymes: No results for input(s):  CKTOTAL, CKMB, CKMBINDEX, TROPONINI in the last 168 hours.  BNP: Invalid input(s): POCBNP  CBG: No results for input(s): GLUCAP in the last 168 hours.  Microbiology: Results for orders placed or performed during the hospital encounter of 12/20/20  Aerobic/Anaerobic Culture (surgical/deep wound)     Status: None   Collection Time: 01/01/21  9:24 AM   Specimen: PATH Other; Tissue  Result Value Ref Range Status   Specimen Description   Final    TOE LEFT Performed at [facility], [address], [place], [place] [address]    Special Requests   Final    NONE Performed at [facility], [address] [place], [place] [address]    Gram Stain NO WBC SEEN NO ORGANISMS SEEN   Final   Culture   Final    RARE STENOTROPHOMONAS MALTOPHILIA NO ANAEROBES ISOLATED Performed at [facility], [place] [address], [place], [place] [address]    Report Status 01/06/2021 FINAL  Final   Organism ID, Bacteria STENOTROPHOMONAS MALTOPHILIA  Final      Susceptibility   Stenotrophomonas maltophilia - MIC*    LEVOFLOXACIN 1 SENSITIVE Sensitive     TRIMETH/SULFA <=20 SENSITIVE Sensitive     * RARE STENOTROPHOMONAS MALTOPHILIA  Resp Panel by RT-PCR (Flu A&B, Covid) Nasopharyngeal Swab     Status: Abnormal   Collection Time: 01/05/21  8:00 AM  Specimen: Nasopharyngeal Swab; Nasopharyngeal(NP) swabs in vial transport medium  Result Value Ref Range Status   SARS Coronavirus 2 by RT PCR POSITIVE (A) NEGATIVE Final    Comment: RESULT CALLED TO, READ BACK BY AND VERIFIED WITH:  IBME AJIBADE AT [id number] 01/06/21 SDR (NOTE) SARS-CoV-2 target nucleic acids are DETECTED.  The SARS-CoV-2 RNA is generally detectable in upper respiratory specimens during the acute phase of infection. Positive results are indicative of the presence of the identified virus, but do not rule out bacterial infection or co-infection with other pathogens not detected by the test. Clinical correlation with patient  history and other diagnostic information is necessary to determine patient infection status. The expected result is Negative.  Fact Sheet for Patients: EntrepreneurPulse.com.au  Fact Sheet for Healthcare Providers: IncredibleEmployment.be  This test is not yet approved or cleared by the [place] FDA and  has been authorized for detection and/or diagnosis of SARS-CoV-2 by FDA under an Emergency Use Authorization (EUA).  This EUA will remain in effect (meaning this test can be  used) for the duration of  the COVID-19 declaration under Section 564(b)(1) of the Act, 21 U.S.C. section 360bbb-3(b)(1), unless the authorization is terminated or revoked sooner.     Influenza A by PCR NEGATIVE NEGATIVE Final   Influenza B by PCR NEGATIVE NEGATIVE Final    Comment: (NOTE) The Xpert Xpress SARS-CoV-2/FLU/RSV plus assay is intended as an aid in the diagnosis of influenza from Nasopharyngeal swab specimens and should not be used as a sole basis for treatment. Nasal washings and aspirates are unacceptable for Xpert Xpress SARS-CoV-2/FLU/RSV testing.  Fact Sheet for Patients: EntrepreneurPulse.com.au  Fact Sheet for Healthcare Providers: IncredibleEmployment.be  This test is not yet approved or cleared by the [place] FDA and has been authorized for detection and/or diagnosis of SARS-CoV-2 by FDA under an Emergency Use Authorization (EUA). This EUA will remain in effect (meaning this test can be used) for the duration of the COVID-19 declaration under Section 564(b)(1) of the Act, 21 U.S.C. section 360bbb-3(b)(1), unless the authorization is terminated or revoked.  Performed at [facility], [address] [place], [place] [address]     Coagulation Studies: No results for input(s): LABPROT, INR in the last 72 hours.  Urinalysis: No results for input(s): COLORURINE, LABSPEC, PHURINE, GLUCOSEU,  HGBUR, BILIRUBINUR, KETONESUR, PROTEINUR, UROBILINOGEN, NITRITE, LEUKOCYTESUR in the last 72 hours.  Invalid input(s): APPERANCEUR    Imaging: No results found.   Medications:   . sodium chloride Stopped (01/06/21 0419)   . acetaminophen  650 mg Oral Q8H  . apixaban  5 mg Oral BID  . aspirin EC  81 mg Oral Daily  . atorvastatin  40 mg Oral Daily  . Chlorhexidine Gluconate Cloth  6 each Topical Daily  . epoetin (EPOGEN/PROCRIT) injection  8,000 Units Intravenous Q T,Th,Sa-HD  . folic acid  1 mg Oral Daily  . gabapentin  100 mg Oral QHS  . levothyroxine  300 mcg Oral QHS  . metoprolol tartrate  25 mg Oral BID  . pantoprazole  40 mg Oral QHS  . polyethylene glycol  17 g Oral Daily  . senna-docusate  2 tablet Oral QHS  . sevelamer carbonate  3,200 mg Oral TID WC  . sodium chloride flush  10-40 mL Intracatheter Q12H   albuterol, hydrALAZINE, HYDROcodone-acetaminophen, methocarbamol, morphine injection, ondansetron **OR** ondansetron (ZOFRAN) IV, ondansetron (ZOFRAN) IV, sevelamer carbonate, sodium chloride flush  Assessment/ Plan:  Ms. TKIA BOLERJACK is a 50 y.o.  female with  end stage renal disease on hemodialysis, coronary artery disease, hypertension, peripheral vascular disease, hypothyroidismwas admitted on1/25/2022for Cellulitis of fourth toe of left foot [L03.032]   [facility] MWF [facility] RIJ permcath  1. ESRD: Currently receiving HD on M,W,F  Continued d/c planning for SNF and outpatient HD No indication for HD today, plan for treatment tomorrow   2.Covid 19 infection diagnosed on 12/14/20 Completed three treatments of Remdesivir. asympomtic  No longer on precautions.  Currently on room air  3. Anemia of CKD - Epo given during HD  - Hgb 7.8 - will need repeat CBC at next dialysis treatment.   4.. Secondary hyperparathyroidism of renal origin N 25.81 Sevelamer TID with meals   5. Cellulitis of fourth toe on left foot s/p  ray amputation of left 4th and 5th toes.. Continues to heal from procedure Surgical dressing changed daily  Surgery team managing this care Following with Vascular as well , s/p aortogram and left lower extremity angiogram with angioplasty   6. Afib with RVR Controlled on lopressor at this time Cardioversion planned for outpatient setting   LOS: [place] 2/13/202212:31 PM

## 2021-01-08 NOTE — Progress Notes (Signed)
[facility] [place] NAME: Lori [name]    MR#:  [id number]  DATE OF BIRTH:  03-09-1971  SUBJECTIVE:   Patient overall doing ok.   REVIEW OF SYSTEMS:   Review of Systems  Constitutional: Negative for chills, fever and weight loss.  HENT: Negative for ear discharge, ear pain and nosebleeds.   Eyes: Negative for blurred vision, pain and discharge.  Respiratory: Negative for sputum production, shortness of breath, wheezing and stridor.   Cardiovascular: Negative for chest pain, palpitations, orthopnea and PND.  Gastrointestinal: Negative for abdominal pain, diarrhea, nausea and vomiting.  Genitourinary: Negative for frequency and urgency.  Musculoskeletal: Positive for back pain. Negative for joint pain.  Neurological: Positive for weakness. Negative for sensory change, speech change and focal weakness.  Psychiatric/Behavioral: Negative for depression and hallucinations. The patient is not nervous/anxious.    Tolerating Diet:yes Tolerating PT: rec rehab  DRUG ALLERGIES:   Allergies  Allergen Reactions  . Activase [Alteplase] Shortness Of Breath  . Bee Pollen Anaphylaxis  . Warfarin Sodium Nausea And Vomiting and Rash    VITALS:  Blood pressure (!) 140/129, pulse 69, temperature 98.3 F (36.8 C), resp. rate 16, height '5\' 3"'$  (1.6 m), weight 116.4 kg, SpO2 95 %.  PHYSICAL EXAMINATION:   Physical Exam  GENERAL:  50 y.o.-year-old patient lying in the bed with no acute distress. Appears chronically ill. HEENT: Head atraumatic, normocephalic. Oropharynx and nasopharynx clear.  LUNGS: Normal breath sounds bilaterally, no wheezing, rales, rhonchi. No use of accessory muscles of respiration. Right upper chest HD access CARDIOVASCULAR: S1, S2 normal. No murmurs, rubs, or gallops. Irregular rhythm ABDOMEN: Soft, nontender, nondistended. Bowel sounds present. No organomegaly or mass.  EXTREMITIES: post op dressing +. Fluid filled blister right  calf. No erythema NEUROLOGIC: gen weakness-- grossly nonfocal PSYCHIATRIC:  patient is alert and oriented x 3.   LABORATORY PANEL:  CBC Recent Labs  Lab 01/04/21 0527  WBC 14.1*  HGB 7.8*  HCT 24.6*  PLT 318    Chemistries  Recent Labs  Lab 01/04/21 0527  NA 133*  K 4.6  CL 95*  CO2 23  GLUCOSE 78  BUN 36*  CREATININE 6.80*  CALCIUM 8.1*   Cardiac Enzymes No results for input(s): TROPONINI in the last 168 hours. RADIOLOGY:  No results found. ASSESSMENT AND PLAN:    50 y.o.femalewith medical history significant forCAD s/p stent to LAD in 2020, ESRD on HD MWF, hypertension, hypothyroidism, hyperparathyroidism, GERD who was initially admitted to [facility] on 12/14/2020 for a left foot wound. She was started on antibiotics. General surgery evaluated the patient and opined that she may ultimately need a left AKA. Patient wanted to discuss with her vascular surgeon Dr. Delana Meyer before any surgery would be considered. She was transferred to [facility] for further evaluation  Rapid a fib with RVR/hypotension/questionable septic shock in the setting of ESRB and left foot infection --was transferred to ICU on 30 January. she received bolus of amiodarone, digoxin, IV Cardizem drip now off. Levophed discontinued, remains on midodrine. Cardiology following. HR wnl today - cont midodrine - lopressor 25 mg bid - continue asa, was on IV heparin gtt--now changed to po eliquis (on 01/04/21) --cardiology aware of oral anticoagulation - outpt consideration of cardioversion--pt follows with Dr Terrence Dupont  Left foot wound infection with gangrene/chronic ischemia of the left lower extremity, now s/p ray amputation of left 4th and 5th toes --On admission, necrotic/ischemic appearance of left fourth and fifth toes.  [name]. Status post aortogram and left lower extremity angiogram with angioplasty of left peroneal artery/left SFA and  popliteal arteries along with mechanical thrombectomy and stent placement of the left SFA and popliteal artery on 12/22/2020 --s/p repeat Angiogram on 2/4 with angioplasty and multiple stents placed. --  s/p ray amputation of left 4th and 5th toes by podiatry on 2/6, immediate post-op course complicated by hypotension. - follow vascular surgery and podiatry recs - Antibiotics finished. received cefepime/flagyl 1/26-1/27, zosyn 1/27-2/1, unasyn 2/1>2/6. Wound culture has grown Enterococcus faecalis and Proteus vulgaris.  - pt/ot advising snf  End-stage renal disease on hemodialysis Hyperkalemia Nephrology following. Initially treated with covid t/t/s schedule, now that covid precautions discontinued has transitioned to mwf schedule. Tolerated dialysis yesterday.  Hyperkalemia resolved w/ dialysis - f/u nephro recs, cont mwf dialysis  Anemia of chronic disease Folic acid deficiency H has slowly downtrended, was 7 yesterday, received 1 u during dialysis, is 7.8 . No melena or other bleeding. Folate low, b12 normal - epo w/ HD - started folic acid  Bronze skin Iron overload Ferritin and tsat markedly elevated, likely 2/2 chronic esrd with blood and iron transfusions. Curbsided nephrology, this can be addressed as outpatient by avoiding future iron administration  COVID-19 positive -Incidentally Covid + 12/14/2020. She has been asymptomatic.  --She states she has been fully vaccinated including booster dose.  --She received 3 days IV remdesivir. Currently no need for further treatment. Precautions discontinued  CAD: s/p multiple stents -Continue aspirin, BB and statin --follows with Dr Terrence Dupont ([facility])  Hypertension: with relative hypotension -cont BB -d/ced midodrine  Hypothyroidism: TSH markedly elevated, pt says was not taking synthroid for several weeks prior to arrival because pharmacy didn't have her meds - home synthroid resumed, will need repeat TSH in  several weeks  Consultants:Vascular surgery/nephrology/podiatry/ cardilogy  Procedures: - aortogram and left lower extremity angiogram with angioplasty of left peroneal artery/left SFA and popliteal arteries along with mechanical thrombectomy and stent placement of the left SFA and popliteal artery on 12/22/2020.  - Repeat angiogram with angioplasty and stent placement 2/4. - ray amputation left 4th and 5th toes 2/6    DVT prophylaxis:apixaban Code Status:Full Family Communication:dter Lori [name] on the phone  Disposition Plan: SNF Status is: Inpatient     Dispo: The patient is from: Home Anticipated d/c is to: SNF Anticipated d/c date is:   [facility] [place]  and patient's outpatient dialysis chair time still pending! Per TOC likely will not get it till Monday--is the barrier to discharge! patient currently is medically best at baseline and ok  to d/c.     Level of care: Med-Surg Status is: Inpatient       TOTAL TIME TAKING CARE OF THIS PATIENT: *25* minutes.  >50% time spent on counselling and coordination of care  Note: This dictation was prepared with Dragon dictation along with smaller phrase technology. Any transcriptional errors that result from this process are unintentional.  Fritzi Mandes M.D    [facility]   CC: Primary care physician; Patient, No Pcp PerPatient ID: Lori [name], female   DOB: 1971-02-24, 50 y.o.   MRN: [id number]

## 2021-01-09 DIAGNOSIS — L03032 Cellulitis of left toe: Secondary | ICD-10-CM

## 2021-01-09 LAB — CBC
HCT: 25.2 % — ABNORMAL LOW (ref 36.0–46.0)
Hemoglobin: 8 g/dL — ABNORMAL LOW (ref 12.0–15.0)
MCH: 31.1 pg (ref 26.0–34.0)
MCHC: 31.7 g/dL (ref 30.0–36.0)
MCV: 98.1 fL (ref 80.0–100.0)
Platelets: 283 10*3/uL (ref 150–400)
RBC: 2.57 MIL/uL — ABNORMAL LOW (ref 3.87–5.11)
RDW: 17 % — ABNORMAL HIGH (ref 11.5–15.5)
WBC: 10.7 10*3/uL — ABNORMAL HIGH (ref 4.0–10.5)
nRBC: 0.2 % (ref 0.0–0.2)

## 2021-01-09 LAB — PHOSPHORUS: Phosphorus: 4.7 mg/dL — ABNORMAL HIGH (ref 2.5–4.6)

## 2021-01-09 LAB — HEPATITIS B SURFACE ANTIGEN: Hepatitis B Surface Ag: NONREACTIVE

## 2021-01-09 MED ORDER — ATORVASTATIN CALCIUM 40 MG PO TABS: 40.0000 mg | ORAL_TABLET | Freq: Every day | ORAL | 0 refills | Status: AC

## 2021-01-09 MED ORDER — HYDROCODONE-ACETAMINOPHEN 5-325 MG PO TABS: ORAL_TABLET | ORAL | 0 refills | Status: DC

## 2021-01-09 MED ORDER — METOPROLOL TARTRATE 25 MG PO TABS: 25.0000 mg | ORAL_TABLET | Freq: Two times a day (BID) | ORAL | 0 refills | Status: AC

## 2021-01-09 MED ORDER — EPOETIN ALFA 10000 UNIT/ML IJ SOLN
10000.0000 [IU] | INTRAMUSCULAR | Status: DC
  Administered 2021-01-09: 10000 [IU] via INTRAVENOUS

## 2021-01-09 MED ORDER — APIXABAN 5 MG PO TABS: 5.0000 mg | ORAL_TABLET | Freq: Two times a day (BID) | ORAL | 2 refills | Status: AC

## 2021-01-09 MED ORDER — LOPERAMIDE HCL 2 MG PO CAPS
2.0000 mg | ORAL_CAPSULE | Freq: Once | ORAL | Status: AC
  Administered 2021-01-09: 2 mg via ORAL
  Filled 2021-01-09: qty 1

## 2021-01-09 NOTE — Anesthesia Postprocedure Evaluation (Signed)
Anesthesia Post Note  Patient: Lori [name]  Procedure(s) Performed: Lower Extremity Angiography (Right )  Patient location during evaluation: Cath Lab Anesthesia Type: General Level of consciousness: awake Pain management: pain level controlled Vital Signs Assessment: post-procedure vital signs reviewed and stable Respiratory status: spontaneous breathing Cardiovascular status: blood pressure returned to baseline Postop Assessment: no apparent nausea or vomiting Anesthetic complications: no   No complications documented.   Last Vitals:  Vitals:   01/09/21 1303 01/09/21 1315  BP: 123/90 91/60  Pulse: 86   Resp: 14 16  Temp:    SpO2:      Last Pain:  Vitals:   01/09/21 0950  TempSrc:   PainSc: 0-No pain                 Neva Seat

## 2021-01-09 NOTE — Discharge Summary (Signed)
[place] [facility] NAME: Kenneth Daigler    MR#:  [id number]  DATE OF BIRTH:  1971/03/08  DATE OF ADMISSION:  12/20/2020 ADMITTING PHYSICIAN: Lenore Cordia, MD  DATE OF DISCHARGE: 01/09/2021  PRIMARY CARE PHYSICIAN: Patient, No Pcp Per    ADMISSION DIAGNOSIS:  Cellulitis of fourth toe of left foot [id number]  DISCHARGE DIAGNOSIS:  rapid a fib with RVR left foot gangrene, chronic ischemia status post amputation left fourth and fifth toes end-stage renal disease on hemodialysis anemia of chronic disease/folic acid deficiency SECONDARY DIAGNOSIS:   Past Medical History:  Diagnosis Date  . Anemia   . ASCVD (arteriosclerotic cardiovascular disease)    a. s/p prior stenting of LAD b. NSTEMI in 01/2018 requiring DES x3 to RCA 2/2 spiral dissection from ost->mid RCA but residual dzs in dRCA, LAD & OM2; c. 09/2019 Cath/PCI: LM nl, LAD 50 ISR, 65 p/m, 34m(2.5x15 Resolute Onyx DES), 90d, LCX 99d, OM2 99 (2.25x26 Resolute Onyx DES), RCA 40p/243mSR, 85d, RPDA 100ost, RPAV 50. EF 50-55%.  . Calciphylaxis   . Chronic abdominal wound infection   . COVID-19 virus infection 11/2020  . Dialysis patient ([facility]  . Diastolic dysfunction    a. 11/2020 Echo: EF 55-60%, mod conc LVh, gr2 DD, nl RV size/fxn. Sev dil LA. Triv MR. Mod-Sev mitral annular Ca2+. Mild-mod Ao sclerosis w/o stenosis.  . 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 peitonitis; difficult vascular access-->HD MWF.  . Marland Kitchenangrene of left foot ([facility]   a. 11/2020 L forefoot dry gangrene involving 4th and 5th MTP joints & digits, & R 5th MTP joint.  . Marland KitchenERD (gastroesophageal reflux disease)   . Hashimoto thyroiditis   . Hyperparathyroidism   . Hypertension   . Hypothyroidism   . Medically noncompliant   . Morbid obesity ([facility]  . PAD (peripheral artery disease) ([facility]   a. 11/2020 PTA of L peroneal, PTA/thrombectomy, Viabahn stenting x 2  to the L SFA and popliteal arteries (6x25015m 6x150m58m . Persistent atrial fibrillation (HCC)[place] a. Noted during hospitalization 11/2020.    HOSPITAL COURSE:  50 y7.femalewith medical history significant forCAD s/p stent to LAD in 2020, ESRD on HD MWF, hypertension, hypothyroidism, hyperparathyroidism, GERD who [facility] for a left foot wound. She was started on antibiotics. General surgery evaluated the patient and opined that she may ultimately need a left AKA. Patient wanted to discuss with her vascular surgeon Dr. SchnDelana Meyerore any surgery would be considered. [facility]  Rapid a fib with RVR/hypotension/questionable septic shock in the setting of ESRB and left foot infection -- pt seen by [facility] following. HR wnl  - lopressor 25 mg bid - continue asa and po eliquis (on 01/04/21) -- per cardiology recommnedaitons outpt consideration of cardioversion--pt follows with Dr HarwTerrence Dupontft foot wound infection with gangrene/chronic ischemia of the left lower extremity, now s/p ray amputation of left 4th and 5th toes --On admission, necrotic/ischemic appearance of left fourth and fifth toes. [facility]. -- Status post aortogram and left lower extremity angiogram with angioplasty of left peroneal artery/left SFA and popliteal arteries along with mechanical thrombectomy and stent placement of the left SFA and popliteal artery on 12/22/2020 --s/p repeat Angiogram on 2/4 with angioplasty and multiple stents placed. --  s/p ray amputation of left 4th and 5th toes by podiatry on 2/6 - follow vascular surgery and podiatry recs - Antibioticsfinished. receivedcefepime/flagyl 1/26-1/27, zosyn 1/27-2/1, unasyn 2/1>2/6. Wound culture has grown Enterococcus faecalis and Proteus vulgaris.  - pt/otadvising snf --dressing changes per Dr Vickki Muff  End-stage  renal disease on hemodialysis Hyperkalemia - f/u nephro recs,cont mwf dialysis  Anemia of chronic disease Folic acid deficiency --Folate low, b12 normal - epo w/ HD - started folic acid and MVI  Bronze skin Iron overload Ferritin and tsat markedly elevated, likely 2/2 chronic esrd with blood and iron transfusions. Per nephrology, this can be addressed as outpatient by avoiding future iron administration  COVID-19 positive -Incidentally Covid + 12/14/2020. She has been asymptomatic.  --She states she has been fully vaccinated including booster dose.  --She received 3 days IV remdesivir. Currently no need for further treatment. --Isolation Precautions discontinued  CAD: s/p multiple stents -Continue aspirin, BB and statin --follows with Dr Terrence Dupont ([facility])  Hypertension -cont BB  Hypothyroidism: TSH markedly elevated, pt says was not taking synthroid for several weeks prior to arrival because pharmacy didn't have her meds -home synthroid resumed, will need repeat TSH in several weeks  Consultants:Vascular surgery/nephrology/podiatry/ cardilogy  Procedures: - aortogram and left lower extremity angiogram with angioplasty of left peroneal artery/left SFA and popliteal arteries along with mechanical thrombectomy and stent placement of the left SFA and popliteal artery on 12/22/2020.  - Repeat angiogram with angioplasty and stent placement 2/4. - ray amputation left 4th and 5th toes 2/6    DVT prophylaxis:apixaban Code Status:Full Family Communication:dter Amy Disposition Plan:SNF Status is: Inpatient     Dispo: The patient is from: Home Anticipated d/c is to:SNF Anticipated d/c date is:  [facility] [place]   patient currently is medically best D/c today after HD chair time obtained CONSULTS OBTAINED:  Treatment Team:  Algernon Huxley, MD Lavonia Dana, MD Alphonsus Sias, MD  DRUG ALLERGIES:    Allergies  Allergen Reactions  . Activase [Alteplase] Shortness Of Breath  . Bee Pollen Anaphylaxis  . Warfarin Sodium Nausea And Vomiting and Rash    DISCHARGE MEDICATIONS:   Allergies as of 01/09/2021      Reactions   Activase [alteplase] Shortness Of Breath   Bee Pollen Anaphylaxis   Warfarin Sodium Nausea And Vomiting, Rash      Medication List    STOP taking these medications   ceFEPIme 2 g in sodium chloride 0.9 % 100 mL   fentaNYL 100 MCG/2ML injection Commonly known as: SUBLIMAZE   metroNIDAZOLE 500 MG tablet Commonly known as: FLAGYL   midodrine 10 MG tablet Commonly known as: PROAMATINE   nystatin powder Commonly known as: MYCOSTATIN/NYSTOP   ticagrelor 90 MG Tabs tablet Commonly known as: BRILINTA   vancomycin 1-5 GM/200ML-% Soln Commonly known as: VANCOCIN     TAKE these medications   albuterol 108 (90 Base) MCG/ACT inhaler Commonly known as: VENTOLIN HFA Inhale 2 puffs into the lungs every 6 (six) hours as needed. Shortness of breath   apixaban 5 MG Tabs tablet Commonly known as: ELIQUIS Take 1 tablet (5 mg total) by mouth 2 (two) times daily.   aspirin EC 81 MG tablet Take 81 mg by mouth daily. Swallow whole.   atorvastatin 40 MG tablet Commonly known as: LIPITOR Take 1 tablet (40 mg total) by mouth daily.   b complex-vitamin c-folic acid 0.8 MG Tabs tablet Take 1 tablet by mouth daily.   calcitRIOL 0.25 MCG capsule Commonly known  as: ROCALTROL Take 3 capsules (0.75 mcg total) by mouth every Monday, Wednesday, and Friday with hemodialysis.   EPINEPHrine 0.3 mg/0.3 mL Soaj injection Commonly known as: EPI-PEN Inject 0.3 mg into the muscle as needed.   gabapentin 100 MG capsule Commonly known as: NEURONTIN Take 1 capsule (100 mg total) by mouth at bedtime.   HYDROcodone-acetaminophen 5-325 MG tablet Commonly known as: NORCO/VICODIN TAKE (1) TABLET BY MOUTH EVERY (6) HOURS AS NEEDED.   levothyroxine 300 MCG tablet Commonly  known as: SYNTHROID Take 300 mcg by mouth at bedtime. SYNTHROID ONLY-BRAND NAME MEDICALLY NECESSARY   metoprolol tartrate 25 MG tablet Commonly known as: LOPRESSOR Take 1 tablet (25 mg total) by mouth 2 (two) times daily.   nitroGLYCERIN 0.4 MG SL tablet Commonly known as: NITROSTAT Place 1 tablet (0.4 mg total) under the tongue every 5 (five) minutes x 3 doses as needed for chest pain.   pantoprazole 40 MG tablet Commonly known as: PROTONIX Take 1 tablet (40 mg total) by mouth at bedtime.   polyethylene glycol 17 g packet Commonly known as: MIRALAX / GLYCOLAX Take 17 g by mouth 2 (two) times daily.   sevelamer carbonate 800 MG tablet Commonly known as: RENVELA Take 1,600-3,200 mg by mouth 3 (three) times daily with meals. '3200mg'$  with meals and '1600mg'$  with snacks            Discharge Care Instructions  (From admission, onward)         Start     Ordered   01/09/21 0000  Discharge wound care:       Comments: Per Podiatry: Left forefoot gangreene--Paint incision with betadine.  Apply 4x4's, abd's, kerlex wrap. No compression   01/09/21 0831          If you experience worsening of your admission symptoms, develop shortness of breath, life threatening emergency, suicidal or homicidal thoughts you must seek medical attention immediately by calling 911 or calling your MD immediately  if symptoms less severe.  You Must read complete instructions/literature along with all the possible adverse reactions/side effects for all the Medicines you take and that have been prescribed to you. Take any new Medicines after you have completely understood and accept all the possible adverse reactions/side effects.   Please note  You were cared for by a hospitalist during your hospital stay. If you have any questions about your discharge medications or the care you received while you were in the hospital after you are discharged, you can call the unit and asked to speak with the hospitalist  on call if the hospitalist that took care of you is not available. Once you are discharged, your primary care physician will handle any further medical issues. Please note that NO REFILLS for any discharge medications will be authorized once you are discharged, as it is imperative that you return to your primary care physician (or establish a relationship with a primary care physician if you do not have one) for your aftercare needs so that they can reassess your need for medications and monitor your lab values. Today   SUBJECTIVE   No new complaints Had good BM over the weekend VITAL SIGNS:  Blood pressure (!) 147/97, pulse 83, temperature 98.5 F (36.9 C), resp. rate 16, height '5\' 3"'$  (1.6 m), weight 116.4 kg, SpO2 100 %.  I/O:    Intake/Output Summary (Last 24 hours) at 01/09/2021 0833 Last data filed at 01/08/2021 1850 Gross per 24 hour  Intake 480 ml  Output --  Net  480 ml    PHYSICAL EXAMINATION:  GENERAL:  50 y.o.-year-old patient lying in the bed with no acute distress. Appears chronically ill. HEENT: Head atraumatic, normocephalic. Oropharynx and nasopharynx clear.  LUNGS: Normal breath sounds bilaterally, no wheezing, rales, rhonchi. No use of accessory muscles of respiration. Right upper chest HD access CARDIOVASCULAR: S1, S2 normal. No murmurs, rubs, or gallops. Irregular rhythm ABDOMEN: Soft, nontender, nondistended. Bowel sounds present. No organomegaly or mass.  EXTREMITIES: post op dressing +. Fluid filled blister right calf. No erythema NEUROLOGIC: gen weakness-- grossly nonfocal PSYCHIATRIC:  patient is alert and oriented x 3.    DATA REVIEW:   CBC  Recent Labs  Lab 01/09/21 0448  WBC 10.7*  HGB 8.0*  HCT 25.2*  PLT 283    Chemistries  Recent Labs  Lab 01/04/21 0527  NA 133*  K 4.6  CL 95*  CO2 23  GLUCOSE 78  BUN 36*  CREATININE 6.80*  CALCIUM 8.1*    Microbiology Results   Recent Results (from the past 240 hour(s))  Aerobic/Anaerobic  Culture (surgical/deep wound)     Status: None   Collection Time: 01/01/21  9:24 AM   Specimen: PATH Other; Tissue  Result Value Ref Range Status   Specimen Description   Final    TOE LEFT Performed at [facility], [address] [place], [place] [address]    Special Requests   Final    NONE Performed at [facility], [address] [place], [place] [address]    Gram Stain NO WBC SEEN NO ORGANISMS SEEN   Final   Culture   Final    RARE STENOTROPHOMONAS MALTOPHILIA NO ANAEROBES ISOLATED Performed at [facility], [place] [address], [place], [place] [address]    Report Status 01/06/2021 FINAL  Final   Organism ID, Bacteria STENOTROPHOMONAS MALTOPHILIA  Final      Susceptibility   Stenotrophomonas maltophilia - MIC*    LEVOFLOXACIN 1 SENSITIVE Sensitive     TRIMETH/SULFA <=20 SENSITIVE Sensitive     * RARE STENOTROPHOMONAS MALTOPHILIA  Resp Panel by RT-PCR (Flu A&B, Covid) Nasopharyngeal Swab     Status: Abnormal   Collection Time: 01/05/21  8:00 AM   Specimen: Nasopharyngeal Swab; Nasopharyngeal(NP) swabs in vial transport medium  Result Value Ref Range Status   SARS Coronavirus 2 by RT PCR POSITIVE (A) NEGATIVE Final    Comment: RESULT CALLED TO, READ BACK BY AND VERIFIED WITH:  IBME AJIBADE AT [id number] 01/06/21 SDR (NOTE) SARS-CoV-2 target nucleic acids are DETECTED.  The SARS-CoV-2 RNA is generally detectable in upper respiratory specimens during the acute phase of infection. Positive results are indicative of the presence of the identified virus, but do not rule out bacterial infection or co-infection with other pathogens not detected by the test. Clinical correlation with patient history and other diagnostic information is necessary to determine patient infection status. The expected result is Negative.  Fact Sheet for Patients: EntrepreneurPulse.com.au  Fact Sheet for Healthcare  Providers: IncredibleEmployment.be  This test is not yet approved or cleared by the [place] FDA and  has been authorized for detection and/or diagnosis of SARS-CoV-2 by FDA under an Emergency Use Authorization (EUA).  This EUA will remain in effect (meaning this test can be  used) for the duration of  the COVID-19 declaration under Section 564(b)(1) of the Act, 21 U.S.C. section 360bbb-3(b)(1), unless the authorization is terminated or revoked sooner.     Influenza A by PCR NEGATIVE NEGATIVE Final  Influenza B by PCR NEGATIVE NEGATIVE Final    Comment: (NOTE) The Xpert Xpress SARS-CoV-2/FLU/RSV plus assay is intended as an aid in the diagnosis of influenza from Nasopharyngeal swab specimens and should not be used as a sole basis for treatment. Nasal washings and aspirates are unacceptable for Xpert Xpress SARS-CoV-2/FLU/RSV testing.  Fact Sheet for Patients: EntrepreneurPulse.com.au  Fact Sheet for Healthcare Providers: IncredibleEmployment.be  This test is not yet approved or cleared by the [place] FDA and has been authorized for detection and/or diagnosis of SARS-CoV-2 by FDA under an Emergency Use Authorization (EUA). This EUA will remain in effect (meaning this test can be used) for the duration of the COVID-19 declaration under Section 564(b)(1) of the Act, 21 U.S.C. section 360bbb-3(b)(1), unless the authorization is terminated or revoked.  Performed at [facility], [address] [place], [place] [address]     RADIOLOGY:  No results found.   CODE STATUS:     Code Status Orders  (From admission, onward)         Start     Ordered   12/20/20 1957  Full code  Continuous        12/20/20 1957        Code Status History    Date Active Date Inactive Code Status Order ID Comments User Context   12/14/2020 1925 12/20/2020 1833 Full Code [id number]  Albertine Patricia, MD ED    10/13/2019 0130 10/22/2019 1502 Full Code [id number]  Oswald Hillock, MD Inpatient   02/09/2018 1120 02/15/2018 0114 Full Code [id number]  Charolette Forward, MD Inpatient   03/27/2014 0242 03/31/2014 0437 Full Code [id number]  Oswald Hillock, MD Inpatient   Advance Care Planning Activity    Advance Directive Documentation   Flowsheet Row Most Recent Value  Type of Advance Directive Living will  Pre-existing out of facility DNR order (yellow form or pink MOST form) --  "MOST" Form in Place? --       TOTAL TIME TAKING CARE OF THIS PATIENT: 35 minutes.    Fritzi Mandes M.D  [facility]    CC: Primary care physician; Patient, No Pcp Per

## 2021-01-09 NOTE — Progress Notes (Signed)
PT Cancellation Note  Patient Details Name: JANVI MUNAFO MRN: [id number] DOB: 04-Jun-1971   Cancelled Treatment:    Reason Eval/Treat Not Completed: Patient at procedure or test/unavailable.  Pt currently off unit at dialysis.  Will re-attempt PT treatment at a later date/time.  Leitha Bleak, PT 01/09/21, 11:57 AM

## 2021-01-09 NOTE — TOC Progression Note (Signed)
Transition of Care [facility]) - Progression Note    Patient Details  Name: Lori [name] MRN: [id number] Date of Birth: Dec 31, 1970  Transition of Care [facility]) CM/SW [place], LCSW Phone Number: 01/09/2021, 3:19 PM  Clinical Narrative:   Followed up with Dialysis Coordinator Estill Bamberg. Patient's dialysis placement cannot be confirmed today due to needing a lab result. Will try for tomorrow. Updated Hinton Dyer at [facility].    Expected Discharge Plan: Home/Self Care Barriers to Discharge: Continued Medical Work up  Expected Discharge Plan and Services Expected Discharge Plan: Home/Self Care       Living arrangements for the past 2 months: Single Family Home Expected Discharge Date: 01/09/21                                     Social Determinants of Health (SDOH) Interventions    Readmission Risk Interventions Readmission Risk Prevention Plan 01/03/2021 12/14/2020  Medication Screening - Complete  Transportation Screening Complete Complete  PCP or Specialist Appt within 3-5 Days Complete -  HRI or Home Care Consult Complete -  Social Work Consult for [place] Planning/Counseling Complete -  Palliative Care Screening Not Applicable -  Medication Review Press photographer) Complete -  Some recent data might be hidden

## 2021-01-09 NOTE — Discharge Instructions (Signed)
Continue your HD per outpt schedule

## 2021-01-09 NOTE — Progress Notes (Signed)
[facility]  ROUNDING NOTE   Subjective:   Lori [name] was seen during dialysis    HEMODIALYSIS FLOWSHEET:  Blood Flow Rate (mL/min): 400 mL/min Arterial Pressure (mmHg): -160 mmHg Venous Pressure (mmHg): 300 mmHg Transmembrane Pressure (mmHg): 70 mmHg Ultrafiltration Rate (mL/min): 500 mL/min Dialysate Flow Rate (mL/min): 600 ml/min Conductivity: Machine : 14.2 Conductivity: Machine : 14.2 Dialysis Fluid Bolus: Normal Saline Bolus Amount (mL): 200 mL Dialysate Change:  (Asked MD about K of 4.6 and 3k bath vs 2k. No new orders mainatin 3k per MD)  She denies any acute complaints during our visit.  She is alert and able to answer questions Denies shortness of breath, chest pain and nausea Continues room air.   Objective:  Vital signs in last 24 hours:  Temp:  [97.6 F (36.4 C)-98.8 F (37.1 C)] 98.8 F (37.1 C) (02/14 1342) Pulse Rate:  [75-108] 77 (02/14 1342) Resp:  [14-20] 17 (02/14 1342) BP: (91-147)/(60-104) 127/90 (02/14 1342) SpO2:  [95 %-100 %] 98 % (02/14 1342)  Weight change:  Filed Weights   12/29/20 2030 12/30/20 0359 12/31/20 0433  Weight: 113.8 kg 113.5 kg 116.4 kg    Intake/Output: I/O last 3 completed shifts: In: 480 [P.O.:480] Out: -    Intake/Output this shift:  Total I/O In: -  Out: 1500 [Other:1500]  Physical Exam: General: NAD, resting quietly in bed  Head: Normocephalic, atraumatic. Moist oral mucosal membranes  Eyes: Anicteric  Neck: Supple, trachea midline  Lungs:  Clear to auscultation  Heart: Regular rate and rhythm  Abdomen:  Soft, nontender,   Extremities:  No peripheral edema, LLE 4-5 toe amputation, foul smell  Neurologic: Alert Nonfocal, moving all four extremities  Skin: No lesions  Access: Right IJ permcath     Basic Metabolic Panel: Recent Labs  Lab 01/03/21 0438 01/04/21 0527 01/09/21 1000  NA 136 133*  --   K 4.1 4.6  --   CL 97* 95*  --   CO2 26 23  --   GLUCOSE 87 78  --   BUN 27* 36*  --    CREATININE 5.33* 6.80*  --   CALCIUM 8.2* 8.1*  --   PHOS  --   --  4.7*    Liver Function Tests: No results for input(s): AST, ALT, ALKPHOS, BILITOT, PROT, ALBUMIN in the last 168 hours. No results for input(s): LIPASE, AMYLASE in the last 168 hours. No results for input(s): AMMONIA in the last 168 hours.  CBC: Recent Labs  Lab 01/03/21 0438 01/04/21 0527 01/09/21 0448  WBC 15.4* 14.1* 10.7*  HGB 7.7* 7.8* 8.0*  HCT 24.4* 24.6* 25.2*  MCV 99.6 99.6 98.1  PLT 279 318 283    Cardiac Enzymes: No results for input(s): CKTOTAL, CKMB, CKMBINDEX, TROPONINI in the last 168 hours.  BNP: Invalid input(s): POCBNP  CBG: No results for input(s): GLUCAP in the last 168 hours.  Microbiology: Results for orders placed or performed during the hospital encounter of 12/20/20  Aerobic/Anaerobic Culture (surgical/deep wound)     Status: None   Collection Time: 01/01/21  9:24 AM   Specimen: PATH Other; Tissue  Result Value Ref Range Status   Specimen Description   Final    TOE LEFT Performed at [facility], [address], [place], [place] [address]    Special Requests   Final    NONE Performed at [facility], [place],  [address]    Gram Stain NO WBC SEEN NO ORGANISMS SEEN  Final   Culture   Final    RARE STENOTROPHOMONAS MALTOPHILIA NO ANAEROBES ISOLATED Performed at [facility], [place] [address], [place], [place] [address]    Report Status 01/06/2021 FINAL  Final   Organism ID, Bacteria STENOTROPHOMONAS MALTOPHILIA  Final      Susceptibility   Stenotrophomonas maltophilia - MIC*    LEVOFLOXACIN 1 SENSITIVE Sensitive     TRIMETH/SULFA <=20 SENSITIVE Sensitive     * RARE STENOTROPHOMONAS MALTOPHILIA  Resp Panel by RT-PCR (Flu A&B, Covid) Nasopharyngeal Swab     Status: Abnormal   Collection Time: 01/05/21  8:00 AM   Specimen: Nasopharyngeal Swab; Nasopharyngeal(NP) swabs in vial transport medium  Result Value Ref Range  Status   SARS Coronavirus 2 by RT PCR POSITIVE (A) NEGATIVE Final    Comment: RESULT CALLED TO, READ BACK BY AND VERIFIED WITH:  IBME AJIBADE AT [id number] 01/06/21 SDR (NOTE) SARS-CoV-2 target nucleic acids are DETECTED.  The SARS-CoV-2 RNA is generally detectable in upper respiratory specimens during the acute phase of infection. Positive results are indicative of the presence of the identified virus, but do not rule out bacterial infection or co-infection with other pathogens not detected by the test. Clinical correlation with patient history and other diagnostic information is necessary to determine patient infection status. The expected result is Negative.  Fact Sheet for Patients: EntrepreneurPulse.com.au  Fact Sheet for Healthcare Providers: IncredibleEmployment.be  This test is not yet approved or cleared by the [place] FDA and  has been authorized for detection and/or diagnosis of SARS-CoV-2 by FDA under an Emergency Use Authorization (EUA).  This EUA will remain in effect (meaning this test can be  used) for the duration of  the COVID-19 declaration under Section 564(b)(1) of the Act, 21 U.S.C. section 360bbb-3(b)(1), unless the authorization is terminated or revoked sooner.     Influenza A by PCR NEGATIVE NEGATIVE Final   Influenza B by PCR NEGATIVE NEGATIVE Final    Comment: (NOTE) The Xpert Xpress SARS-CoV-2/FLU/RSV plus assay is intended as an aid in the diagnosis of influenza from Nasopharyngeal swab specimens and should not be used as a sole basis for treatment. Nasal washings and aspirates are unacceptable for Xpert Xpress SARS-CoV-2/FLU/RSV testing.  Fact Sheet for Patients: EntrepreneurPulse.com.au  Fact Sheet for Healthcare Providers: IncredibleEmployment.be  This test is not yet approved or cleared by the [place] FDA and has been authorized for detection and/or diagnosis of  SARS-CoV-2 by FDA under an Emergency Use Authorization (EUA). This EUA will remain in effect (meaning this test can be used) for the duration of the COVID-19 declaration under Section 564(b)(1) of the Act, 21 U.S.C. section 360bbb-3(b)(1), unless the authorization is terminated or revoked.  Performed at [facility], [address] [place], [place] [address]     Coagulation Studies: No results for input(s): LABPROT, INR in the last 72 hours.  Urinalysis: No results for input(s): COLORURINE, LABSPEC, PHURINE, GLUCOSEU, HGBUR, BILIRUBINUR, KETONESUR, PROTEINUR, UROBILINOGEN, NITRITE, LEUKOCYTESUR in the last 72 hours.  Invalid input(s): APPERANCEUR    Imaging: No results found.   Medications:   . sodium chloride Stopped (01/06/21 0419)   . acetaminophen  650 mg Oral Q8H  . apixaban  5 mg Oral BID  . aspirin EC  81 mg Oral Daily  . atorvastatin  40 mg Oral Daily  . Chlorhexidine Gluconate Cloth  6 each Topical Daily  . epoetin (EPOGEN/PROCRIT) injection  10,000 Units Intravenous Q M,W,F-HD  . folic acid  1 mg  Oral Daily  . gabapentin  100 mg Oral QHS  . levothyroxine  300 mcg Oral QHS  . metoprolol tartrate  25 mg Oral BID  . pantoprazole  40 mg Oral QHS  . polyethylene glycol  17 g Oral Daily  . senna-docusate  2 tablet Oral QHS  . sevelamer carbonate  3,200 mg Oral TID WC  . sodium chloride flush  10-40 mL Intracatheter Q12H   albuterol, hydrALAZINE, HYDROcodone-acetaminophen, methocarbamol, morphine injection, ondansetron **OR** ondansetron (ZOFRAN) IV, ondansetron (ZOFRAN) IV, sevelamer carbonate, sodium chloride flush  Assessment/ Plan:  Ms. LADETRA TORRENS is a 50 y.o.  female with end stage renal disease on hemodialysis, coronary artery disease, hypertension, peripheral vascular disease, hypothyroidismwas admitted on1/25/2022for Cellulitis of fourth toe of left foot [L03.032]   [facility] ([place]) MWF [facility] RIJ permcath  1.  ESRD: Receiving treatment today Continuing to arrange outpatient HD needs with SNF placement Once arranged, she is cleared to discharge Will continue to dialyze on MWF until discharge   2.Covid 19 infection diagnosed on 12/14/20 No need for isolation at this time Completed all treatments  3. Anemia of CKD - Hgb increased to 8.0 -Will increase EPO 10000 units with dialysis  4.. Secondary hyperparathyroidism of renal origin N 25.81 Renvela ordered daily with meals  5. Cellulitis of fourth toe on left foot s/p ray amputation of left 4th and 5th toes.. -Continues to heal from this procedure -Daily dressing changes -Will notify primary team of neucrotic smell  6. Afib with RVR Lopressor continues to control rate Cardioversion planned for outpatient setting   LOS: [place] 2/14/20221:51 PM

## 2021-01-09 NOTE — Care Management Important Message (Signed)
Important Message  Patient Details  Name: Lori [name] MRN: [id number] Date of Birth: 1971-01-09   Medicare Important Message Given:  Yes     Dannette Barbara 01/09/2021, 2:31 PM

## 2021-01-10 DIAGNOSIS — N2581 Secondary hyperparathyroidism of renal origin: Secondary | ICD-10-CM | POA: Diagnosis not present

## 2021-01-10 DIAGNOSIS — E039 Hypothyroidism, unspecified: Secondary | ICD-10-CM | POA: Diagnosis not present

## 2021-01-10 DIAGNOSIS — I739 Peripheral vascular disease, unspecified: Secondary | ICD-10-CM | POA: Diagnosis not present

## 2021-01-10 DIAGNOSIS — L089 Local infection of the skin and subcutaneous tissue, unspecified: Secondary | ICD-10-CM | POA: Diagnosis not present

## 2021-01-10 DIAGNOSIS — D638 Anemia in other chronic diseases classified elsewhere: Secondary | ICD-10-CM | POA: Diagnosis not present

## 2021-01-10 DIAGNOSIS — R279 Unspecified lack of coordination: Secondary | ICD-10-CM | POA: Diagnosis not present

## 2021-01-10 DIAGNOSIS — L03032 Cellulitis of left toe: Secondary | ICD-10-CM | POA: Diagnosis not present

## 2021-01-10 DIAGNOSIS — M6281 Muscle weakness (generalized): Secondary | ICD-10-CM | POA: Diagnosis not present

## 2021-01-10 DIAGNOSIS — Z8673 Personal history of transient ischemic attack (TIA), and cerebral infarction without residual deficits: Secondary | ICD-10-CM | POA: Diagnosis not present

## 2021-01-10 DIAGNOSIS — R197 Diarrhea, unspecified: Secondary | ICD-10-CM | POA: Diagnosis not present

## 2021-01-10 DIAGNOSIS — R262 Difficulty in walking, not elsewhere classified: Secondary | ICD-10-CM | POA: Diagnosis not present

## 2021-01-10 DIAGNOSIS — Z8616 Personal history of COVID-19: Secondary | ICD-10-CM | POA: Diagnosis not present

## 2021-01-10 DIAGNOSIS — I10 Essential (primary) hypertension: Secondary | ICD-10-CM | POA: Diagnosis not present

## 2021-01-10 DIAGNOSIS — S98212D Complete traumatic amputation of two or more left lesser toes, subsequent encounter: Secondary | ICD-10-CM | POA: Diagnosis not present

## 2021-01-10 DIAGNOSIS — M62838 Other muscle spasm: Secondary | ICD-10-CM | POA: Diagnosis not present

## 2021-01-10 DIAGNOSIS — R2689 Other abnormalities of gait and mobility: Secondary | ICD-10-CM | POA: Diagnosis not present

## 2021-01-10 DIAGNOSIS — D631 Anemia in chronic kidney disease: Secondary | ICD-10-CM | POA: Diagnosis not present

## 2021-01-10 DIAGNOSIS — Z992 Dependence on renal dialysis: Secondary | ICD-10-CM | POA: Diagnosis not present

## 2021-01-10 DIAGNOSIS — D509 Iron deficiency anemia, unspecified: Secondary | ICD-10-CM | POA: Diagnosis not present

## 2021-01-10 DIAGNOSIS — G8929 Other chronic pain: Secondary | ICD-10-CM | POA: Diagnosis not present

## 2021-01-10 DIAGNOSIS — E441 Mild protein-calorie malnutrition: Secondary | ICD-10-CM | POA: Diagnosis not present

## 2021-01-10 DIAGNOSIS — E213 Hyperparathyroidism, unspecified: Secondary | ICD-10-CM | POA: Diagnosis not present

## 2021-01-10 DIAGNOSIS — I2510 Atherosclerotic heart disease of native coronary artery without angina pectoris: Secondary | ICD-10-CM | POA: Diagnosis not present

## 2021-01-10 DIAGNOSIS — Z4781 Encounter for orthopedic aftercare following surgical amputation: Secondary | ICD-10-CM | POA: Diagnosis not present

## 2021-01-10 DIAGNOSIS — E785 Hyperlipidemia, unspecified: Secondary | ICD-10-CM | POA: Diagnosis not present

## 2021-01-10 DIAGNOSIS — K219 Gastro-esophageal reflux disease without esophagitis: Secondary | ICD-10-CM | POA: Diagnosis not present

## 2021-01-10 DIAGNOSIS — N186 End stage renal disease: Secondary | ICD-10-CM | POA: Diagnosis not present

## 2021-01-10 DIAGNOSIS — I4891 Unspecified atrial fibrillation: Secondary | ICD-10-CM | POA: Diagnosis not present

## 2021-01-10 DIAGNOSIS — Z8639 Personal history of other endocrine, nutritional and metabolic disease: Secondary | ICD-10-CM | POA: Diagnosis not present

## 2021-01-10 DIAGNOSIS — I130 Hypertensive heart and chronic kidney disease with heart failure and stage 1 through stage 4 chronic kidney disease, or unspecified chronic kidney disease: Secondary | ICD-10-CM | POA: Diagnosis not present

## 2021-01-10 DIAGNOSIS — Z9862 Peripheral vascular angioplasty status: Secondary | ICD-10-CM | POA: Diagnosis not present

## 2021-01-10 DIAGNOSIS — R5381 Other malaise: Secondary | ICD-10-CM | POA: Diagnosis not present

## 2021-01-10 LAB — HEPATITIS B CORE ANTIBODY, IGM: Hep B C IgM: NONREACTIVE

## 2021-01-10 LAB — HEPATITIS B SURFACE ANTIBODY,QUALITATIVE: Hep B S Ab: REACTIVE — AB

## 2021-01-10 NOTE — Plan of Care (Signed)

## 2021-01-10 NOTE — Progress Notes (Signed)
Patient accepted at [facility] TTS 9:00am, can start on Thursday.

## 2021-01-10 NOTE — Progress Notes (Signed)
[facility]  ROUNDING NOTE   Subjective:   Patient is seen resting comfortable in bed She is alert and oriented. She denies shortness of breath She states she was able to eat breakfast this morning Denies nausea and vomiting  Objective:  Vital signs in last 24 hours:  Temp:  [97 F (36.1 C)-98.6 F (37 C)] 97.8 F (36.6 C) (02/15 1138) Pulse Rate:  [60-110] 60 (02/15 1138) Resp:  [16-18] 16 (02/15 1138) BP: (113-144)/(73-99) 113/96 (02/15 1138) SpO2:  [98 %-100 %] 100 % (02/15 1138)  Weight change:  Filed Weights   12/29/20 2030 12/30/20 0359 12/31/20 0433  Weight: 113.8 kg 113.5 kg 116.4 kg    Intake/Output: I/O last 3 completed shifts: In: -  Out: 1500 [Other:1500]   Intake/Output this shift:  Total I/O In: 240 [P.O.:240] Out: -   Physical Exam: General: NAD, resting quietly in bed  Head: Normocephalic, atraumatic. Moist oral mucosal membranes  Eyes: Anicteric  Neck: Supple, trachea midline  Lungs:  Clear to auscultation  Heart: Regular rate and rhythm  Abdomen:  Soft, nontender,   Extremities:  No peripheral edema, LLE 4-5 toe amputation, foul smell  Neurologic: Alert Nonfocal, moving all four extremities  Skin: No lesions  Access: Right IJ permcath     Basic Metabolic Panel: Recent Labs  Lab 01/04/21 0527 01/09/21 1000  NA 133*  --   K 4.6  --   CL 95*  --   CO2 23  --   GLUCOSE 78  --   BUN 36*  --   CREATININE 6.80*  --   CALCIUM 8.1*  --   PHOS  --  4.7*    Liver Function Tests: No results for input(s): AST, ALT, ALKPHOS, BILITOT, PROT, ALBUMIN in the last 168 hours. No results for input(s): LIPASE, AMYLASE in the last 168 hours. No results for input(s): AMMONIA in the last 168 hours.  CBC: Recent Labs  Lab 01/04/21 0527 01/09/21 0448  WBC 14.1* 10.7*  HGB 7.8* 8.0*  HCT 24.6* 25.2*  MCV 99.6 98.1  PLT 318 283    Cardiac Enzymes: No results for input(s): CKTOTAL, CKMB, CKMBINDEX, TROPONINI in the last 168  hours.  BNP: Invalid input(s): POCBNP  CBG: No results for input(s): GLUCAP in the last 168 hours.  Microbiology: Results for orders placed or performed during the hospital encounter of 12/20/20  Aerobic/Anaerobic Culture (surgical/deep wound)     Status: None   Collection Time: 01/01/21  9:24 AM   Specimen: PATH Other; Tissue  Result Value Ref Range Status   Specimen Description   Final    TOE LEFT Performed at [facility], [address] [place], [place] [address]    Special Requests   Final    NONE Performed at [facility], [address] [place], [place] [address]    Gram Stain NO WBC SEEN NO ORGANISMS SEEN   Final   Culture   Final    RARE STENOTROPHOMONAS MALTOPHILIA NO ANAEROBES ISOLATED Performed at [facility], [place] [address] [place], [place] [address]    Report Status 01/06/2021 FINAL  Final   Organism ID, Bacteria STENOTROPHOMONAS MALTOPHILIA  Final      Susceptibility   Stenotrophomonas maltophilia - MIC*    LEVOFLOXACIN 1 SENSITIVE Sensitive     TRIMETH/SULFA <=20 SENSITIVE Sensitive     * RARE STENOTROPHOMONAS MALTOPHILIA  Resp Panel by RT-PCR (Flu A&B, Covid) Nasopharyngeal Swab     Status: Abnormal  Collection Time: 01/05/21  8:00 AM   Specimen: Nasopharyngeal Swab; Nasopharyngeal(NP) swabs in vial transport medium  Result Value Ref Range Status   SARS Coronavirus 2 by RT PCR POSITIVE (A) NEGATIVE Final    Comment: RESULT CALLED TO, READ BACK BY AND VERIFIED WITH:  IBME AJIBADE AT [id number] 01/06/21 SDR (NOTE) SARS-CoV-2 target nucleic acids are DETECTED.  The SARS-CoV-2 RNA is generally detectable in upper respiratory specimens during the acute phase of infection. Positive results are indicative of the presence of the identified virus, but do not rule out bacterial infection or co-infection with other pathogens not detected by the test. Clinical correlation with patient history and other diagnostic information is  necessary to determine patient infection status. The expected result is Negative.  Fact Sheet for Patients: EntrepreneurPulse.com.au  Fact Sheet for Healthcare Providers: IncredibleEmployment.be  This test is not yet approved or cleared by the [place] FDA and  has been authorized for detection and/or diagnosis of SARS-CoV-2 by FDA under an Emergency Use Authorization (EUA).  This EUA will remain in effect (meaning this test can be  used) for the duration of  the COVID-19 declaration under Section 564(b)(1) of the Act, 21 U.S.C. section 360bbb-3(b)(1), unless the authorization is terminated or revoked sooner.     Influenza A by PCR NEGATIVE NEGATIVE Final   Influenza B by PCR NEGATIVE NEGATIVE Final    Comment: (NOTE) The Xpert Xpress SARS-CoV-2/FLU/RSV plus assay is intended as an aid in the diagnosis of influenza from Nasopharyngeal swab specimens and should not be used as a sole basis for treatment. Nasal washings and aspirates are unacceptable for Xpert Xpress SARS-CoV-2/FLU/RSV testing.  Fact Sheet for Patients: EntrepreneurPulse.com.au  Fact Sheet for Healthcare Providers: IncredibleEmployment.be  This test is not yet approved or cleared by the [place] FDA and has been authorized for detection and/or diagnosis of SARS-CoV-2 by FDA under an Emergency Use Authorization (EUA). This EUA will remain in effect (meaning this test can be used) for the duration of the COVID-19 declaration under Section 564(b)(1) of the Act, 21 U.S.C. section 360bbb-3(b)(1), unless the authorization is terminated or revoked.  Performed at [facility], [address] [place], [place] [address]     Coagulation Studies: No results for input(s): LABPROT, INR in the last 72 hours.  Urinalysis: No results for input(s): COLORURINE, LABSPEC, PHURINE, GLUCOSEU, HGBUR, BILIRUBINUR, KETONESUR, PROTEINUR,  UROBILINOGEN, NITRITE, LEUKOCYTESUR in the last 72 hours.  Invalid input(s): APPERANCEUR    Imaging: No results found.   Medications:   . sodium chloride Stopped (01/06/21 0419)   . acetaminophen  650 mg Oral Q8H  . apixaban  5 mg Oral BID  . aspirin EC  81 mg Oral Daily  . atorvastatin  40 mg Oral Daily  . Chlorhexidine Gluconate Cloth  6 each Topical Daily  . epoetin (EPOGEN/PROCRIT) injection  10,000 Units Intravenous Q M,W,F-HD  . folic acid  1 mg Oral Daily  . gabapentin  100 mg Oral QHS  . levothyroxine  300 mcg Oral QHS  . metoprolol tartrate  25 mg Oral BID  . pantoprazole  40 mg Oral QHS  . polyethylene glycol  17 g Oral Daily  . senna-docusate  2 tablet Oral QHS  . sevelamer carbonate  3,200 mg Oral TID WC  . sodium chloride flush  10-40 mL Intracatheter Q12H   albuterol, hydrALAZINE, HYDROcodone-acetaminophen, methocarbamol, morphine injection, ondansetron **OR** ondansetron (ZOFRAN) IV, ondansetron (ZOFRAN) IV, sevelamer carbonate, sodium chloride flush  Assessment/ Plan:  Ms. Lori Brenchley  [name] is a 50 y.o.  female with end stage renal disease on hemodialysis, coronary artery disease, hypertension, peripheral vascular disease, hypothyroidismwas admitted on1/25/2022for Cellulitis of fourth toe of left foot [L03.032]   [facility] ([place]) MWF [facility] RIJ permcath  1. ESRD: Received HD yesterday Removed 1.5L of fluid Will continue to dialyze on MWF until discharge   2.Covid 19 infection diagnosed on 12/14/20 No need for isolation at this time Completed all treatments  3. Anemia of CKD - Hgb increased to 8.0 -Will increase EPO 10000 units with dialysis  4.. Secondary hyperparathyroidism of renal origin N 25.81 Renvela ordered TID  5. Cellulitis of fourth toe on left foot s/p ray amputation of left 4th and 5th toes.. -Continues to heal from this procedure -Daily dressing changes -managed by surgery and podiatry  6. Afib with  RVR Lopressor continues to control rate Cardioversion planned for outpatient setting   LOS: [place] 2/15/20221:52 PM

## 2021-01-10 NOTE — TOC Transition Note (Signed)
Transition of Care [facility]) - CM/SW Discharge Note   Patient Details  Name: Lori [name] MRN: [id number] Date of Birth: 05/23/1971  Transition of Care [facility]) CM/SW Contact:  Magnus Ivan, LCSW Phone Number: 01/10/2021, 1:44 PM   Clinical Narrative:   Patient to discharge to [facility] today, Room 405 Bed 1. Confirmed with Hinton Dyer at [facility]. CSW updated MD, RN, patient. Asked RN to call report and MD to submit DC Summary. Medical Necessity Form, and Face Sheet placed in Discharge Packet by patient chart. [facility] transport arranged for 3:30 pick up. No other needs identified prior to discharge.     Final next level of care: Skilled Nursing Facility Barriers to Discharge: Barriers Resolved   Patient Goals and CMS Choice Patient states their goals for this hospitalization and ongoing recovery are:: SNF rehab CMS Medicare.gov Compare Post Acute Care list provided to:: Patient Choice offered to / list presented to : Patient  Discharge Placement              Patient chooses bed at: [facility] Patient to be transferred to facility by: [facility] Name of family member notified: Patient. Patient and family notified of of transfer: 01/10/21  Discharge Plan and Services                                     Social Determinants of Health (SDOH) Interventions     Readmission Risk Interventions Readmission Risk Prevention Plan 01/03/2021 12/14/2020  Medication Screening - Complete  Transportation Screening Complete Complete  PCP or Specialist Appt within 3-5 Days Complete -  HRI or Home Care Consult Complete -  Social Work Consult for [place] Planning/Counseling Complete -  Palliative Care Screening Not Applicable -  Medication Review Press photographer) Complete -  Some recent data might be hidden

## 2021-01-10 NOTE — Discharge Summary (Signed)
[facility] [place] NAME: Lori [name]    MR#:  [id number]  DATE OF BIRTH:  06-12-1971  DATE OF ADMISSION:  12/20/2020 ADMITTING PHYSICIAN: Lenore Cordia, MD  DATE OF DISCHARGE: 01/10/2021  PRIMARY CARE PHYSICIAN: Patient, No Pcp Per    ADMISSION DIAGNOSIS:  Cellulitis of fourth toe of left foot [id number]  DISCHARGE DIAGNOSIS:  rapid a fib with RVR left foot gangrene, chronic ischemia status post amputation left fourth and fifth toes end-stage renal disease on hemodialysis anemia of chronic disease/folic acid deficiency SECONDARY DIAGNOSIS:   Past Medical History:  Diagnosis Date  . Anemia   . ASCVD (arteriosclerotic cardiovascular disease)    a. s/p prior stenting of LAD b. NSTEMI in 01/2018 requiring DES x3 to RCA 2/2 spiral dissection from ost->mid RCA but residual dzs in dRCA, LAD & OM2; c. 09/2019 Cath/PCI: LM nl, LAD 50 ISR, 65 p/m, 65m(2.5x15 Resolute Onyx DES), 90d, LCX 99d, OM2 99 (2.25x26 Resolute Onyx DES), RCA 40p/214mSR, 85d, RPDA 100ost, RPAV 50. EF 50-55%.  . Calciphylaxis   . Chronic abdominal wound infection   . COVID-19 virus infection 11/2020  . Dialysis patient ([facility]  . Diastolic dysfunction    a. 11/2020 Echo: EF 55-60%, mod conc LVh, gr2 DD, nl RV size/fxn. Sev dil LA. Triv MR. Mod-Sev mitral annular Ca2+. Mild-mod Ao sclerosis w/o stenosis.  . 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 peitonitis; difficult vascular access-->HD MWF.  . Marland Kitchenangrene of left foot ([facility]   a. 11/2020 L forefoot dry gangrene involving 4th and 5th MTP joints & digits, & R 5th MTP joint.  . Marland KitchenERD (gastroesophageal reflux disease)   . Hashimoto thyroiditis   . Hyperparathyroidism   . Hypertension   . Hypothyroidism   . Medically noncompliant   . Morbid obesity ([facility]  . PAD (peripheral artery disease) ([facility]   a. 11/2020 PTA of L peroneal, PTA/thrombectomy, Viabahn stenting x 2  to the L SFA and popliteal arteries (6x250106m 6x150m41m . Persistent atrial fibrillation (H[facility])[place] a. Noted during hospitalization 11/2020.    HOSPITAL COURSE:  50 y42.femalewith medical history significant forCAD s/p stent to LAD in 2020, ESRD on HD MWF, hypertension, hypothyroidism, hyperparathyroidism, GERD who was [facility] for a left foot wound. She was started on antibiotics. General surgery evaluated the patient and opined that she may ultimately need a left AKA. Patient wanted to discuss with her vascular surgeon Dr. SchnDelana Meyerore any surgery would be considered. [facility]  Rapid a fib with RVR/hypotension/questionable septic shock in the setting of ESRB and left foot infection -- pt seen by [facility] following. HR wnl  - lopressor 25 mg bid - continue asa and po eliquis (on 01/04/21) -- per cardiology recommnedaitons outpt consideration of cardioversion--pt follows with Dr HarwTerrence Dupontft foot wound infection with gangrene/chronic ischemia of the left lower extremity, now s/p ray amputation of left 4th and 5th toes --On admission, necrotic/ischemic appearance of left fourth and fifth toes. [facility]. -- Status post aortogram and left lower extremity angiogram with angioplasty of left peroneal artery/left SFA and popliteal arteries along with mechanical thrombectomy and stent placement of the left SFA and popliteal artery on 12/22/2020 --s/p repeat Angiogram on 2/4 with angioplasty and multiple stents placed. --  s/p ray amputation of left 4th and 5th toes by podiatry on 2/6 - follow vascular surgery and podiatry recs - Antibioticsfinished. receivedcefepime/flagyl 1/26-1/27, zosyn 1/27-2/1, unasyn 2/1>2/6. Wound culture has grown Enterococcus faecalis and Proteus vulgaris.  - pt/otadvising snf --dressing changes per Dr Vickki Muff  End-stage  renal disease on hemodialysis Hyperkalemia - f/u nephro recs,cont mwf dialysis  Anemia of chronic disease Folic acid deficiency --Folate low, b12 normal - epo w/ HD - started folic acid and MVI  Bronze skin Iron overload Ferritin and tsat markedly elevated, likely 2/2 chronic esrd with blood and iron transfusions. Per nephrology, this can be addressed as outpatient by avoiding future iron administration  COVID-19 positive -Incidentally Covid + 12/14/2020. She has been asymptomatic.  --She states she has been fully vaccinated including booster dose.  --She received 3 days IV remdesivir. Currently no need for further treatment. --Isolation Precautions discontinued  CAD: s/p multiple stents -Continue aspirin, BB and statin --follows with Dr Terrence Dupont ([facility])  Hypertension -cont BB  Hypothyroidism: TSH markedly elevated, pt says was not taking synthroid for several weeks prior to arrival because pharmacy didn't have her meds -home synthroid resumed, will need repeat TSH in several weeks  Consultants:Vascular surgery/nephrology/podiatry/ cardilogy  Procedures: - aortogram and left lower extremity angiogram with angioplasty of left peroneal artery/left SFA and popliteal arteries along with mechanical thrombectomy and stent placement of the left SFA and popliteal artery on 12/22/2020.  - Repeat angiogram with angioplasty and stent placement 2/4. - ray amputation left 4th and 5th toes 2/6  Pt was not able to discharge yday due to Hepatitis B lab that was not sent and orders not properly placed by Nephrology.  Labs so far today  HBsAg and HBcAB negative. Hep B surface antibody pending Waiting for HD coordinator to confirm chair time today before pt discharges  DVT prophylaxis:apixaban Code Status:Full Family Communication: none today Disposition Plan:SNF Status is: Inpatient     Dispo: The patient is from: Home Anticipated  d/c is to:SNF Anticipated d/c date is:  [facility] [place]   patient currently is medically best Likely today after HD chair time obtained CONSULTS OBTAINED:  Treatment Team:  Algernon Huxley, MD Lavonia Dana, MD Alphonsus Sias, MD  DRUG ALLERGIES:   Allergies  Allergen Reactions  . Activase [Alteplase] Shortness Of Breath  . Bee Pollen Anaphylaxis  . Warfarin Sodium Nausea And Vomiting and Rash    DISCHARGE MEDICATIONS:   Allergies as of 01/10/2021      Reactions   Activase [alteplase] Shortness Of Breath   Bee Pollen Anaphylaxis   Warfarin Sodium Nausea And Vomiting, Rash      Medication List    STOP taking these medications   ceFEPIme 2 g in sodium chloride 0.9 % 100 mL   fentaNYL 100 MCG/2ML injection Commonly known as: SUBLIMAZE   metroNIDAZOLE 500 MG tablet Commonly known as: FLAGYL   midodrine 10 MG tablet Commonly known as: PROAMATINE   nystatin powder Commonly known as: MYCOSTATIN/NYSTOP   ticagrelor 90 MG Tabs tablet Commonly known as: BRILINTA   vancomycin 1-5 GM/200ML-% Soln Commonly known as: VANCOCIN     TAKE these medications   albuterol 108 (90 Base) MCG/ACT inhaler Commonly known as: VENTOLIN HFA Inhale 2 puffs into the lungs every 6 (six) hours as needed. Shortness of breath   apixaban 5 MG Tabs tablet Commonly known as: ELIQUIS Take 1 tablet (5 mg total) by mouth 2 (two) times daily.   aspirin EC 81 MG tablet Take  81 mg by mouth daily. Swallow whole.   atorvastatin 40 MG tablet Commonly known as: LIPITOR Take 1 tablet (40 mg total) by mouth daily.   b complex-vitamin c-folic acid 0.8 MG Tabs tablet Take 1 tablet by mouth daily.   calcitRIOL 0.25 MCG capsule Commonly known as: ROCALTROL Take 3 capsules (0.75 mcg total) by mouth every Monday, Wednesday, and Friday with hemodialysis.   EPINEPHrine 0.3 mg/0.3 mL Soaj injection Commonly known as: EPI-PEN Inject 0.3 mg into the muscle as needed.    gabapentin 100 MG capsule Commonly known as: NEURONTIN Take 1 capsule (100 mg total) by mouth at bedtime.   HYDROcodone-acetaminophen 5-325 MG tablet Commonly known as: NORCO/VICODIN TAKE (1) TABLET BY MOUTH EVERY (6) HOURS AS NEEDED.   levothyroxine 300 MCG tablet Commonly known as: SYNTHROID Take 300 mcg by mouth at bedtime. SYNTHROID ONLY-BRAND NAME MEDICALLY NECESSARY   metoprolol tartrate 25 MG tablet Commonly known as: LOPRESSOR Take 1 tablet (25 mg total) by mouth 2 (two) times daily.   nitroGLYCERIN 0.4 MG SL tablet Commonly known as: NITROSTAT Place 1 tablet (0.4 mg total) under the tongue every 5 (five) minutes x 3 doses as needed for chest pain.   pantoprazole 40 MG tablet Commonly known as: PROTONIX Take 1 tablet (40 mg total) by mouth at bedtime.   polyethylene glycol 17 g packet Commonly known as: MIRALAX / GLYCOLAX Take 17 g by mouth 2 (two) times daily.   sevelamer carbonate 800 MG tablet Commonly known as: RENVELA Take 1,600-3,200 mg by mouth 3 (three) times daily with meals. '3200mg'$  with meals and '1600mg'$  with snacks            Discharge Care Instructions  (From admission, onward)         Start     Ordered   01/09/21 0000  Discharge wound care:       Comments: Per Podiatry: Left forefoot gangreene--Paint incision with betadine.  Apply 4x4's, abd's, kerlex wrap. No compression   01/09/21 0831          If you experience worsening of your admission symptoms, develop shortness of breath, life threatening emergency, suicidal or homicidal thoughts you must seek medical attention immediately by calling 911 or calling your MD immediately  if symptoms less severe.  You Must read complete instructions/literature along with all the possible adverse reactions/side effects for all the Medicines you take and that have been prescribed to you. Take any new Medicines after you have completely understood and accept all the possible adverse reactions/side effects.    Please note  You were cared for by a hospitalist during your hospital stay. If you have any questions about your discharge medications or the care you received while you were in the hospital after you are discharged, you can call the unit and asked to speak with the hospitalist on call if the hospitalist that took care of you is not available. Once you are discharged, your primary care physician will handle any further medical issues. Please note that NO REFILLS for any discharge medications will be authorized once you are discharged, as it is imperative that you return to your primary care physician (or establish a relationship with a primary care physician if you do not have one) for your aftercare needs so that they can reassess your need for medications and monitor your lab values. Today   SUBJECTIVE   No new complaints Had good BM with loose stools due to bowel prep yday Feels better this am VITAL  SIGNS:  Blood pressure (!) 144/86, pulse 93, temperature 98.6 F (37 C), temperature source Oral, resp. rate 16, height '5\' 3"'$  (1.6 m), weight 116.4 kg, SpO2 100 %.  I/O:    Intake/Output Summary (Last 24 hours) at 01/10/2021 0910 Last data filed at 01/09/2021 1303 Gross per 24 hour  Intake --  Output 1500 ml  Net -1500 ml    PHYSICAL EXAMINATION:  GENERAL:  50 y.o.-year-old patient lying in the bed with no acute distress. Appears chronically ill. HEENT: Head atraumatic, normocephalic. Oropharynx and nasopharynx clear.  LUNGS: Normal breath sounds bilaterally, no wheezing, rales, rhonchi. No use of accessory muscles of respiration. Right upper chest HD access CARDIOVASCULAR: S1, S2 normal. No murmurs, rubs, or gallops. Irregular rhythm ABDOMEN: Soft, nontender, nondistended. Bowel sounds present. No organomegaly or mass.  EXTREMITIES: post op dressing +. Fluid filled blister right calf. No erythema NEUROLOGIC: gen weakness-- grossly nonfocal PSYCHIATRIC:  patient is alert and oriented  x 3.    DATA REVIEW:   CBC  Recent Labs  Lab 01/09/21 0448  WBC 10.7*  HGB 8.0*  HCT 25.2*  PLT 283    Chemistries  Recent Labs  Lab 01/04/21 0527  NA 133*  K 4.6  CL 95*  CO2 23  GLUCOSE 78  BUN 36*  CREATININE 6.80*  CALCIUM 8.1*    Microbiology Results   Recent Results (from the past 240 hour(s))  Aerobic/Anaerobic Culture (surgical/deep wound)     Status: None   Collection Time: 01/01/21  9:24 AM   Specimen: PATH Other; Tissue  Result Value Ref Range Status   Specimen Description   Final    TOE LEFT Performed at [facility], [address] [place], [place] [address]    Special Requests   Final    NONE Performed at [facility], [address], [place], [place] [address]    Gram Stain NO WBC SEEN NO ORGANISMS SEEN   Final   Culture   Final    RARE STENOTROPHOMONAS MALTOPHILIA NO ANAEROBES ISOLATED Performed at [facility], [place] [address] [place], [place] [address]    Report Status 01/06/2021 FINAL  Final   Organism ID, Bacteria STENOTROPHOMONAS MALTOPHILIA  Final      Susceptibility   Stenotrophomonas maltophilia - MIC*    LEVOFLOXACIN 1 SENSITIVE Sensitive     TRIMETH/SULFA <=20 SENSITIVE Sensitive     * RARE STENOTROPHOMONAS MALTOPHILIA  Resp Panel by RT-PCR (Flu A&B, Covid) Nasopharyngeal Swab     Status: Abnormal   Collection Time: 01/05/21  8:00 AM   Specimen: Nasopharyngeal Swab; Nasopharyngeal(NP) swabs in vial transport medium  Result Value Ref Range Status   SARS Coronavirus 2 by RT PCR POSITIVE (A) NEGATIVE Final    Comment: RESULT CALLED TO, READ BACK BY AND VERIFIED WITH:  IBME AJIBADE AT [id number] 01/06/21 SDR (NOTE) SARS-CoV-2 target nucleic acids are DETECTED.  The SARS-CoV-2 RNA is generally detectable in upper respiratory specimens during the acute phase of infection. Positive results are indicative of the presence of the identified virus, but do not rule out bacterial infection or co-infection with  other pathogens not detected by the test. Clinical correlation with patient history and other diagnostic information is necessary to determine patient infection status. The expected result is Negative.  Fact Sheet for Patients: EntrepreneurPulse.com.au  Fact Sheet for Healthcare Providers: IncredibleEmployment.be  This test is not yet approved or cleared by the [place] FDA and  has been authorized for detection and/or diagnosis of  SARS-CoV-2 by FDA under an Emergency Use Authorization (EUA).  This EUA will remain in effect (meaning this test can be  used) for the duration of  the COVID-19 declaration under Section 564(b)(1) of the Act, 21 U.S.C. section 360bbb-3(b)(1), unless the authorization is terminated or revoked sooner.     Influenza A by PCR NEGATIVE NEGATIVE Final   Influenza B by PCR NEGATIVE NEGATIVE Final    Comment: (NOTE) The Xpert Xpress SARS-CoV-2/FLU/RSV plus assay is intended as an aid in the diagnosis of influenza from Nasopharyngeal swab specimens and should not be used as a sole basis for treatment. Nasal washings and aspirates are unacceptable for Xpert Xpress SARS-CoV-2/FLU/RSV testing.  Fact Sheet for Patients: EntrepreneurPulse.com.au  Fact Sheet for Healthcare Providers: IncredibleEmployment.be  This test is not yet approved or cleared by the [place] FDA and has been authorized for detection and/or diagnosis of SARS-CoV-2 by FDA under an Emergency Use Authorization (EUA). This EUA will remain in effect (meaning this test can be used) for the duration of the COVID-19 declaration under Section 564(b)(1) of the Act, 21 U.S.C. section 360bbb-3(b)(1), unless the authorization is terminated or revoked.  Performed at [facility], [address] [place], [place] [address]     RADIOLOGY:  No results found.   CODE STATUS:     Code Status Orders  (From  admission, onward)         Start     Ordered   12/20/20 1957  Full code  Continuous        12/20/20 1957        Code Status History    Date Active Date Inactive Code Status Order ID Comments User Context   12/14/2020 1925 12/20/2020 1833 Full Code [id number]  Albertine Patricia, MD ED   10/13/2019 0130 10/22/2019 1502 Full Code [id number]  Oswald Hillock, MD Inpatient   02/09/2018 1120 02/15/2018 0114 Full Code [id number]  Charolette Forward, MD Inpatient   03/27/2014 0242 03/31/2014 0437 Full Code [id number]  Oswald Hillock, MD Inpatient   Advance Care Planning Activity    Advance Directive Documentation   Flowsheet Row Most Recent Value  Type of Advance Directive Living will  Pre-existing out of facility DNR order (yellow form or pink MOST form) --  "MOST" Form in Place? --       TOTAL TIME TAKING CARE OF THIS PATIENT: 35 minutes.    Fritzi Mandes M.D  [facility]    CC: Primary care physician; Patient, No Pcp Per

## 2021-01-10 NOTE — Progress Notes (Signed)
Report called to [facility] at [facility]

## 2021-01-11 DIAGNOSIS — D638 Anemia in other chronic diseases classified elsewhere: Secondary | ICD-10-CM | POA: Diagnosis not present

## 2021-01-11 DIAGNOSIS — N186 End stage renal disease: Secondary | ICD-10-CM | POA: Diagnosis not present

## 2021-01-11 DIAGNOSIS — I4891 Unspecified atrial fibrillation: Secondary | ICD-10-CM | POA: Diagnosis not present

## 2021-01-11 DIAGNOSIS — S98212D Complete traumatic amputation of two or more left lesser toes, subsequent encounter: Secondary | ICD-10-CM | POA: Diagnosis not present

## 2021-01-12 DIAGNOSIS — Z992 Dependence on renal dialysis: Secondary | ICD-10-CM | POA: Diagnosis not present

## 2021-01-12 DIAGNOSIS — N186 End stage renal disease: Secondary | ICD-10-CM | POA: Diagnosis not present

## 2021-01-13 DIAGNOSIS — M62838 Other muscle spasm: Secondary | ICD-10-CM | POA: Diagnosis not present

## 2021-01-14 DIAGNOSIS — Z992 Dependence on renal dialysis: Secondary | ICD-10-CM | POA: Diagnosis not present

## 2021-01-14 DIAGNOSIS — N186 End stage renal disease: Secondary | ICD-10-CM | POA: Diagnosis not present

## 2021-01-16 DIAGNOSIS — Z8616 Personal history of COVID-19: Secondary | ICD-10-CM | POA: Diagnosis not present

## 2021-01-16 DIAGNOSIS — E213 Hyperparathyroidism, unspecified: Secondary | ICD-10-CM | POA: Diagnosis not present

## 2021-01-16 DIAGNOSIS — I2510 Atherosclerotic heart disease of native coronary artery without angina pectoris: Secondary | ICD-10-CM | POA: Diagnosis not present

## 2021-01-16 DIAGNOSIS — N186 End stage renal disease: Secondary | ICD-10-CM | POA: Diagnosis not present

## 2021-01-16 DIAGNOSIS — I10 Essential (primary) hypertension: Secondary | ICD-10-CM | POA: Diagnosis not present

## 2021-01-16 DIAGNOSIS — E785 Hyperlipidemia, unspecified: Secondary | ICD-10-CM | POA: Diagnosis not present

## 2021-01-16 DIAGNOSIS — I4891 Unspecified atrial fibrillation: Secondary | ICD-10-CM | POA: Diagnosis not present

## 2021-01-16 DIAGNOSIS — L089 Local infection of the skin and subcutaneous tissue, unspecified: Secondary | ICD-10-CM | POA: Diagnosis not present

## 2021-01-17 DIAGNOSIS — Z992 Dependence on renal dialysis: Secondary | ICD-10-CM | POA: Diagnosis not present

## 2021-01-17 DIAGNOSIS — N186 End stage renal disease: Secondary | ICD-10-CM | POA: Diagnosis not present

## 2021-01-17 DIAGNOSIS — M62838 Other muscle spasm: Secondary | ICD-10-CM | POA: Diagnosis not present

## 2021-01-19 DIAGNOSIS — N186 End stage renal disease: Secondary | ICD-10-CM | POA: Diagnosis not present

## 2021-01-19 DIAGNOSIS — Z992 Dependence on renal dialysis: Secondary | ICD-10-CM | POA: Diagnosis not present

## 2021-01-20 DIAGNOSIS — I5032 Chronic diastolic (congestive) heart failure: Secondary | ICD-10-CM | POA: Diagnosis not present

## 2021-01-20 DIAGNOSIS — R579 Shock, unspecified: Secondary | ICD-10-CM | POA: Insufficient documentation

## 2021-01-20 DIAGNOSIS — K5989 Other specified functional intestinal disorders: Secondary | ICD-10-CM | POA: Diagnosis not present

## 2021-01-20 DIAGNOSIS — D62 Acute posthemorrhagic anemia: Secondary | ICD-10-CM | POA: Diagnosis not present

## 2021-01-20 DIAGNOSIS — Z8673 Personal history of transient ischemic attack (TIA), and cerebral infarction without residual deficits: Secondary | ICD-10-CM | POA: Diagnosis not present

## 2021-01-20 DIAGNOSIS — Z4781 Encounter for orthopedic aftercare following surgical amputation: Secondary | ICD-10-CM | POA: Diagnosis not present

## 2021-01-20 DIAGNOSIS — E890 Postprocedural hypothyroidism: Secondary | ICD-10-CM | POA: Diagnosis not present

## 2021-01-20 DIAGNOSIS — N179 Acute kidney failure, unspecified: Secondary | ICD-10-CM | POA: Diagnosis not present

## 2021-01-20 DIAGNOSIS — L089 Local infection of the skin and subcutaneous tissue, unspecified: Secondary | ICD-10-CM | POA: Diagnosis not present

## 2021-01-20 DIAGNOSIS — M6281 Muscle weakness (generalized): Secondary | ICD-10-CM | POA: Diagnosis not present

## 2021-01-20 DIAGNOSIS — R1030 Lower abdominal pain, unspecified: Secondary | ICD-10-CM | POA: Diagnosis not present

## 2021-01-20 DIAGNOSIS — E1151 Type 2 diabetes mellitus with diabetic peripheral angiopathy without gangrene: Secondary | ICD-10-CM | POA: Diagnosis not present

## 2021-01-20 DIAGNOSIS — E785 Hyperlipidemia, unspecified: Secondary | ICD-10-CM | POA: Diagnosis not present

## 2021-01-20 DIAGNOSIS — R1084 Generalized abdominal pain: Secondary | ICD-10-CM | POA: Diagnosis not present

## 2021-01-20 DIAGNOSIS — Z8719 Personal history of other diseases of the digestive system: Secondary | ICD-10-CM | POA: Diagnosis not present

## 2021-01-20 DIAGNOSIS — M62838 Other muscle spasm: Secondary | ICD-10-CM | POA: Diagnosis not present

## 2021-01-20 DIAGNOSIS — Z8616 Personal history of COVID-19: Secondary | ICD-10-CM | POA: Diagnosis not present

## 2021-01-20 DIAGNOSIS — Z8709 Personal history of other diseases of the respiratory system: Secondary | ICD-10-CM | POA: Diagnosis not present

## 2021-01-20 DIAGNOSIS — K626 Ulcer of anus and rectum: Secondary | ICD-10-CM | POA: Diagnosis not present

## 2021-01-20 DIAGNOSIS — D638 Anemia in other chronic diseases classified elsewhere: Secondary | ICD-10-CM | POA: Diagnosis not present

## 2021-01-20 DIAGNOSIS — S98212D Complete traumatic amputation of two or more left lesser toes, subsequent encounter: Secondary | ICD-10-CM | POA: Diagnosis not present

## 2021-01-20 DIAGNOSIS — R0902 Hypoxemia: Secondary | ICD-10-CM | POA: Diagnosis not present

## 2021-01-20 DIAGNOSIS — E538 Deficiency of other specified B group vitamins: Secondary | ICD-10-CM | POA: Diagnosis not present

## 2021-01-20 DIAGNOSIS — E213 Hyperparathyroidism, unspecified: Secondary | ICD-10-CM | POA: Diagnosis not present

## 2021-01-20 DIAGNOSIS — I959 Hypotension, unspecified: Secondary | ICD-10-CM | POA: Diagnosis not present

## 2021-01-20 DIAGNOSIS — E441 Mild protein-calorie malnutrition: Secondary | ICD-10-CM | POA: Diagnosis not present

## 2021-01-20 DIAGNOSIS — I132 Hypertensive heart and chronic kidney disease with heart failure and with stage 5 chronic kidney disease, or end stage renal disease: Secondary | ICD-10-CM | POA: Diagnosis not present

## 2021-01-20 DIAGNOSIS — I2510 Atherosclerotic heart disease of native coronary artery without angina pectoris: Secondary | ICD-10-CM | POA: Diagnosis not present

## 2021-01-20 DIAGNOSIS — D631 Anemia in chronic kidney disease: Secondary | ICD-10-CM | POA: Diagnosis not present

## 2021-01-20 DIAGNOSIS — G8929 Other chronic pain: Secondary | ICD-10-CM | POA: Diagnosis not present

## 2021-01-20 DIAGNOSIS — Z8639 Personal history of other endocrine, nutritional and metabolic disease: Secondary | ICD-10-CM | POA: Diagnosis not present

## 2021-01-20 DIAGNOSIS — Z992 Dependence on renal dialysis: Secondary | ICD-10-CM | POA: Diagnosis not present

## 2021-01-20 DIAGNOSIS — E1122 Type 2 diabetes mellitus with diabetic chronic kidney disease: Secondary | ICD-10-CM | POA: Diagnosis not present

## 2021-01-20 DIAGNOSIS — I120 Hypertensive chronic kidney disease with stage 5 chronic kidney disease or end stage renal disease: Secondary | ICD-10-CM | POA: Diagnosis not present

## 2021-01-20 DIAGNOSIS — R000 Tachycardia, unspecified: Secondary | ICD-10-CM | POA: Diagnosis not present

## 2021-01-20 DIAGNOSIS — R9431 Abnormal electrocardiogram [ECG] [EKG]: Secondary | ICD-10-CM | POA: Diagnosis not present

## 2021-01-20 DIAGNOSIS — Z743 Need for continuous supervision: Secondary | ICD-10-CM | POA: Diagnosis not present

## 2021-01-20 DIAGNOSIS — E875 Hyperkalemia: Secondary | ICD-10-CM | POA: Diagnosis not present

## 2021-01-20 DIAGNOSIS — I4891 Unspecified atrial fibrillation: Secondary | ICD-10-CM | POA: Diagnosis not present

## 2021-01-20 DIAGNOSIS — Z7901 Long term (current) use of anticoagulants: Secondary | ICD-10-CM | POA: Diagnosis not present

## 2021-01-20 DIAGNOSIS — K625 Hemorrhage of anus and rectum: Secondary | ICD-10-CM | POA: Diagnosis not present

## 2021-01-20 DIAGNOSIS — R2689 Other abnormalities of gait and mobility: Secondary | ICD-10-CM | POA: Diagnosis not present

## 2021-01-20 DIAGNOSIS — D72829 Elevated white blood cell count, unspecified: Secondary | ICD-10-CM | POA: Diagnosis not present

## 2021-01-20 DIAGNOSIS — I10 Essential (primary) hypertension: Secondary | ICD-10-CM | POA: Diagnosis not present

## 2021-01-20 DIAGNOSIS — K922 Gastrointestinal hemorrhage, unspecified: Secondary | ICD-10-CM | POA: Diagnosis not present

## 2021-01-20 DIAGNOSIS — N250 Renal osteodystrophy: Secondary | ICD-10-CM | POA: Diagnosis not present

## 2021-01-20 DIAGNOSIS — E039 Hypothyroidism, unspecified: Secondary | ICD-10-CM | POA: Diagnosis not present

## 2021-01-20 DIAGNOSIS — K921 Melena: Secondary | ICD-10-CM | POA: Diagnosis not present

## 2021-01-20 DIAGNOSIS — Z888 Allergy status to other drugs, medicaments and biological substances status: Secondary | ICD-10-CM | POA: Diagnosis not present

## 2021-01-20 DIAGNOSIS — Z452 Encounter for adjustment and management of vascular access device: Secondary | ICD-10-CM | POA: Diagnosis not present

## 2021-01-20 DIAGNOSIS — I953 Hypotension of hemodialysis: Secondary | ICD-10-CM | POA: Diagnosis not present

## 2021-01-20 DIAGNOSIS — I252 Old myocardial infarction: Secondary | ICD-10-CM | POA: Diagnosis not present

## 2021-01-20 DIAGNOSIS — R262 Difficulty in walking, not elsewhere classified: Secondary | ICD-10-CM | POA: Diagnosis not present

## 2021-01-20 DIAGNOSIS — Z9889 Other specified postprocedural states: Secondary | ICD-10-CM | POA: Diagnosis not present

## 2021-01-20 DIAGNOSIS — J45909 Unspecified asthma, uncomplicated: Secondary | ICD-10-CM | POA: Diagnosis not present

## 2021-01-20 DIAGNOSIS — D509 Iron deficiency anemia, unspecified: Secondary | ICD-10-CM | POA: Diagnosis not present

## 2021-01-20 DIAGNOSIS — R578 Other shock: Secondary | ICD-10-CM | POA: Diagnosis not present

## 2021-01-20 DIAGNOSIS — Z20822 Contact with and (suspected) exposure to covid-19: Secondary | ICD-10-CM | POA: Diagnosis not present

## 2021-01-20 DIAGNOSIS — I517 Cardiomegaly: Secondary | ICD-10-CM | POA: Diagnosis not present

## 2021-01-20 DIAGNOSIS — E46 Unspecified protein-calorie malnutrition: Secondary | ICD-10-CM | POA: Diagnosis not present

## 2021-01-20 DIAGNOSIS — Z9862 Peripheral vascular angioplasty status: Secondary | ICD-10-CM | POA: Diagnosis not present

## 2021-01-20 DIAGNOSIS — G4733 Obstructive sleep apnea (adult) (pediatric): Secondary | ICD-10-CM | POA: Diagnosis not present

## 2021-01-20 DIAGNOSIS — N186 End stage renal disease: Secondary | ICD-10-CM | POA: Diagnosis not present

## 2021-01-20 DIAGNOSIS — K219 Gastro-esophageal reflux disease without esophagitis: Secondary | ICD-10-CM | POA: Diagnosis not present

## 2021-01-20 DIAGNOSIS — Z89432 Acquired absence of left foot: Secondary | ICD-10-CM | POA: Diagnosis not present

## 2021-01-23 DIAGNOSIS — Z992 Dependence on renal dialysis: Secondary | ICD-10-CM | POA: Diagnosis not present

## 2021-01-23 DIAGNOSIS — N186 End stage renal disease: Secondary | ICD-10-CM | POA: Diagnosis not present

## 2021-01-27 DIAGNOSIS — I739 Peripheral vascular disease, unspecified: Secondary | ICD-10-CM | POA: Diagnosis not present

## 2021-01-27 DIAGNOSIS — D509 Iron deficiency anemia, unspecified: Secondary | ICD-10-CM | POA: Diagnosis not present

## 2021-01-27 DIAGNOSIS — K922 Gastrointestinal hemorrhage, unspecified: Secondary | ICD-10-CM | POA: Diagnosis not present

## 2021-01-27 DIAGNOSIS — K219 Gastro-esophageal reflux disease without esophagitis: Secondary | ICD-10-CM | POA: Diagnosis not present

## 2021-01-27 DIAGNOSIS — Z95820 Peripheral vascular angioplasty status with implants and grafts: Secondary | ICD-10-CM | POA: Diagnosis not present

## 2021-01-27 DIAGNOSIS — I4891 Unspecified atrial fibrillation: Secondary | ICD-10-CM | POA: Diagnosis not present

## 2021-01-27 DIAGNOSIS — H524 Presbyopia: Secondary | ICD-10-CM | POA: Diagnosis not present

## 2021-01-27 DIAGNOSIS — M79604 Pain in right leg: Secondary | ICD-10-CM | POA: Diagnosis not present

## 2021-01-27 DIAGNOSIS — I7091 Generalized atherosclerosis: Secondary | ICD-10-CM | POA: Diagnosis not present

## 2021-01-27 DIAGNOSIS — L89152 Pressure ulcer of sacral region, stage 2: Secondary | ICD-10-CM | POA: Diagnosis not present

## 2021-01-27 DIAGNOSIS — W19XXXA Unspecified fall, initial encounter: Secondary | ICD-10-CM | POA: Diagnosis not present

## 2021-01-27 DIAGNOSIS — E213 Hyperparathyroidism, unspecified: Secondary | ICD-10-CM | POA: Diagnosis not present

## 2021-01-27 DIAGNOSIS — E441 Mild protein-calorie malnutrition: Secondary | ICD-10-CM | POA: Diagnosis not present

## 2021-01-27 DIAGNOSIS — I10 Essential (primary) hypertension: Secondary | ICD-10-CM | POA: Diagnosis not present

## 2021-01-27 DIAGNOSIS — L89132 Pressure ulcer of right lower back, stage 2: Secondary | ICD-10-CM | POA: Diagnosis not present

## 2021-01-27 DIAGNOSIS — E039 Hypothyroidism, unspecified: Secondary | ICD-10-CM | POA: Diagnosis not present

## 2021-01-27 DIAGNOSIS — H40013 Open angle with borderline findings, low risk, bilateral: Secondary | ICD-10-CM | POA: Diagnosis not present

## 2021-01-27 DIAGNOSIS — E559 Vitamin D deficiency, unspecified: Secondary | ICD-10-CM | POA: Diagnosis not present

## 2021-01-27 DIAGNOSIS — Z8639 Personal history of other endocrine, nutritional and metabolic disease: Secondary | ICD-10-CM | POA: Diagnosis not present

## 2021-01-27 DIAGNOSIS — H5213 Myopia, bilateral: Secondary | ICD-10-CM | POA: Diagnosis not present

## 2021-01-27 DIAGNOSIS — Z89422 Acquired absence of other left toe(s): Secondary | ICD-10-CM | POA: Diagnosis not present

## 2021-01-27 DIAGNOSIS — H2513 Age-related nuclear cataract, bilateral: Secondary | ICD-10-CM | POA: Diagnosis not present

## 2021-01-27 DIAGNOSIS — I2510 Atherosclerotic heart disease of native coronary artery without angina pectoris: Secondary | ICD-10-CM | POA: Diagnosis not present

## 2021-01-27 DIAGNOSIS — R2689 Other abnormalities of gait and mobility: Secondary | ICD-10-CM | POA: Diagnosis not present

## 2021-01-27 DIAGNOSIS — S98212D Complete traumatic amputation of two or more left lesser toes, subsequent encounter: Secondary | ICD-10-CM | POA: Diagnosis not present

## 2021-01-27 DIAGNOSIS — I25118 Atherosclerotic heart disease of native coronary artery with other forms of angina pectoris: Secondary | ICD-10-CM | POA: Diagnosis not present

## 2021-01-27 DIAGNOSIS — Z8673 Personal history of transient ischemic attack (TIA), and cerebral infarction without residual deficits: Secondary | ICD-10-CM | POA: Diagnosis not present

## 2021-01-27 DIAGNOSIS — M62838 Other muscle spasm: Secondary | ICD-10-CM | POA: Diagnosis not present

## 2021-01-27 DIAGNOSIS — M1711 Unilateral primary osteoarthritis, right knee: Secondary | ICD-10-CM | POA: Diagnosis not present

## 2021-01-27 DIAGNOSIS — E785 Hyperlipidemia, unspecified: Secondary | ICD-10-CM | POA: Diagnosis not present

## 2021-01-27 DIAGNOSIS — N189 Chronic kidney disease, unspecified: Secondary | ICD-10-CM | POA: Diagnosis not present

## 2021-01-27 DIAGNOSIS — J45909 Unspecified asthma, uncomplicated: Secondary | ICD-10-CM | POA: Diagnosis not present

## 2021-01-27 DIAGNOSIS — G8929 Other chronic pain: Secondary | ICD-10-CM | POA: Diagnosis not present

## 2021-01-27 DIAGNOSIS — M25561 Pain in right knee: Secondary | ICD-10-CM | POA: Diagnosis not present

## 2021-01-27 DIAGNOSIS — N186 End stage renal disease: Secondary | ICD-10-CM | POA: Diagnosis not present

## 2021-01-27 DIAGNOSIS — R578 Other shock: Secondary | ICD-10-CM | POA: Diagnosis not present

## 2021-01-27 DIAGNOSIS — M6281 Muscle weakness (generalized): Secondary | ICD-10-CM | POA: Diagnosis not present

## 2021-01-27 DIAGNOSIS — D638 Anemia in other chronic diseases classified elsewhere: Secondary | ICD-10-CM | POA: Diagnosis not present

## 2021-01-27 DIAGNOSIS — M25562 Pain in left knee: Secondary | ICD-10-CM | POA: Diagnosis not present

## 2021-01-27 DIAGNOSIS — Z4781 Encounter for orthopedic aftercare following surgical amputation: Secondary | ICD-10-CM | POA: Diagnosis not present

## 2021-01-27 DIAGNOSIS — Z8616 Personal history of COVID-19: Secondary | ICD-10-CM | POA: Diagnosis not present

## 2021-01-27 DIAGNOSIS — Z743 Need for continuous supervision: Secondary | ICD-10-CM | POA: Diagnosis not present

## 2021-01-27 DIAGNOSIS — L089 Local infection of the skin and subcutaneous tissue, unspecified: Secondary | ICD-10-CM | POA: Diagnosis not present

## 2021-01-27 DIAGNOSIS — L97912 Non-pressure chronic ulcer of unspecified part of right lower leg with fat layer exposed: Secondary | ICD-10-CM | POA: Diagnosis not present

## 2021-01-27 DIAGNOSIS — B351 Tinea unguium: Secondary | ICD-10-CM | POA: Diagnosis not present

## 2021-01-27 DIAGNOSIS — R262 Difficulty in walking, not elsewhere classified: Secondary | ICD-10-CM | POA: Diagnosis not present

## 2021-01-27 DIAGNOSIS — Z992 Dependence on renal dialysis: Secondary | ICD-10-CM | POA: Diagnosis not present

## 2021-01-27 DIAGNOSIS — I480 Paroxysmal atrial fibrillation: Secondary | ICD-10-CM | POA: Diagnosis not present

## 2021-01-27 DIAGNOSIS — Z9862 Peripheral vascular angioplasty status: Secondary | ICD-10-CM | POA: Diagnosis not present

## 2021-01-27 DIAGNOSIS — M79659 Pain in unspecified thigh: Secondary | ICD-10-CM | POA: Diagnosis not present

## 2021-01-27 DIAGNOSIS — E46 Unspecified protein-calorie malnutrition: Secondary | ICD-10-CM | POA: Diagnosis not present

## 2021-01-28 DIAGNOSIS — Z992 Dependence on renal dialysis: Secondary | ICD-10-CM | POA: Diagnosis not present

## 2021-01-28 DIAGNOSIS — N186 End stage renal disease: Secondary | ICD-10-CM | POA: Diagnosis not present

## 2021-01-30 DIAGNOSIS — I25118 Atherosclerotic heart disease of native coronary artery with other forms of angina pectoris: Secondary | ICD-10-CM | POA: Diagnosis not present

## 2021-01-30 DIAGNOSIS — J45909 Unspecified asthma, uncomplicated: Secondary | ICD-10-CM | POA: Diagnosis not present

## 2021-01-30 DIAGNOSIS — E559 Vitamin D deficiency, unspecified: Secondary | ICD-10-CM | POA: Diagnosis not present

## 2021-01-30 DIAGNOSIS — G8929 Other chronic pain: Secondary | ICD-10-CM | POA: Diagnosis not present

## 2021-01-30 DIAGNOSIS — E785 Hyperlipidemia, unspecified: Secondary | ICD-10-CM | POA: Diagnosis not present

## 2021-01-30 DIAGNOSIS — K922 Gastrointestinal hemorrhage, unspecified: Secondary | ICD-10-CM | POA: Diagnosis not present

## 2021-01-30 DIAGNOSIS — N186 End stage renal disease: Secondary | ICD-10-CM | POA: Diagnosis not present

## 2021-01-30 DIAGNOSIS — S98212D Complete traumatic amputation of two or more left lesser toes, subsequent encounter: Secondary | ICD-10-CM | POA: Diagnosis not present

## 2021-01-30 DIAGNOSIS — E039 Hypothyroidism, unspecified: Secondary | ICD-10-CM | POA: Diagnosis not present

## 2021-01-30 DIAGNOSIS — K219 Gastro-esophageal reflux disease without esophagitis: Secondary | ICD-10-CM | POA: Diagnosis not present

## 2021-01-30 DIAGNOSIS — I480 Paroxysmal atrial fibrillation: Secondary | ICD-10-CM | POA: Diagnosis not present

## 2021-01-30 DIAGNOSIS — R578 Other shock: Secondary | ICD-10-CM | POA: Diagnosis not present

## 2021-01-30 DIAGNOSIS — M1711 Unilateral primary osteoarthritis, right knee: Secondary | ICD-10-CM | POA: Diagnosis not present

## 2021-01-30 DIAGNOSIS — I4891 Unspecified atrial fibrillation: Secondary | ICD-10-CM | POA: Diagnosis not present

## 2021-01-30 DIAGNOSIS — I10 Essential (primary) hypertension: Secondary | ICD-10-CM | POA: Diagnosis not present

## 2021-01-31 DIAGNOSIS — Z992 Dependence on renal dialysis: Secondary | ICD-10-CM | POA: Diagnosis not present

## 2021-01-31 DIAGNOSIS — N186 End stage renal disease: Secondary | ICD-10-CM | POA: Diagnosis not present

## 2021-02-01 DIAGNOSIS — E039 Hypothyroidism, unspecified: Secondary | ICD-10-CM | POA: Diagnosis not present

## 2021-02-02 ENCOUNTER — Other Ambulatory Visit (INDEPENDENT_AMBULATORY_CARE_PROVIDER_SITE_OTHER): Payer: Self-pay | Admitting: Vascular Surgery

## 2021-02-02 DIAGNOSIS — Z992 Dependence on renal dialysis: Secondary | ICD-10-CM | POA: Diagnosis not present

## 2021-02-02 DIAGNOSIS — Z95820 Peripheral vascular angioplasty status with implants and grafts: Secondary | ICD-10-CM

## 2021-02-02 DIAGNOSIS — N186 End stage renal disease: Secondary | ICD-10-CM | POA: Diagnosis not present

## 2021-02-03 DIAGNOSIS — I10 Essential (primary) hypertension: Secondary | ICD-10-CM | POA: Diagnosis not present

## 2021-02-04 DIAGNOSIS — N186 End stage renal disease: Secondary | ICD-10-CM | POA: Diagnosis not present

## 2021-02-04 DIAGNOSIS — Z992 Dependence on renal dialysis: Secondary | ICD-10-CM | POA: Diagnosis not present

## 2021-02-06 ENCOUNTER — Ambulatory Visit (INDEPENDENT_AMBULATORY_CARE_PROVIDER_SITE_OTHER): Payer: No Typology Code available for payment source

## 2021-02-06 ENCOUNTER — Other Ambulatory Visit: Payer: Self-pay

## 2021-02-06 ENCOUNTER — Ambulatory Visit (INDEPENDENT_AMBULATORY_CARE_PROVIDER_SITE_OTHER): Payer: Medicare Other | Admitting: Nurse Practitioner

## 2021-02-06 DIAGNOSIS — Z992 Dependence on renal dialysis: Secondary | ICD-10-CM

## 2021-02-06 DIAGNOSIS — N186 End stage renal disease: Secondary | ICD-10-CM | POA: Diagnosis not present

## 2021-02-06 DIAGNOSIS — I10 Essential (primary) hypertension: Secondary | ICD-10-CM

## 2021-02-06 DIAGNOSIS — I739 Peripheral vascular disease, unspecified: Secondary | ICD-10-CM | POA: Diagnosis not present

## 2021-02-06 DIAGNOSIS — Z95820 Peripheral vascular angioplasty status with implants and grafts: Secondary | ICD-10-CM

## 2021-02-06 DIAGNOSIS — M79604 Pain in right leg: Secondary | ICD-10-CM | POA: Diagnosis not present

## 2021-02-06 MED ORDER — AMOXICILLIN-POT CLAVULANATE 875-125 MG PO TABS: 1.0000 | ORAL_TABLET | Freq: Two times a day (BID) | ORAL | 0 refills | Status: DC

## 2021-02-06 MED ORDER — TRAMADOL HCL 50 MG PO TABS: 50.0000 mg | ORAL_TABLET | Freq: Four times a day (QID) | ORAL | 0 refills | Status: DC | PRN

## 2021-02-07 DIAGNOSIS — Z992 Dependence on renal dialysis: Secondary | ICD-10-CM | POA: Diagnosis not present

## 2021-02-07 DIAGNOSIS — N186 End stage renal disease: Secondary | ICD-10-CM | POA: Diagnosis not present

## 2021-02-09 DIAGNOSIS — Z992 Dependence on renal dialysis: Secondary | ICD-10-CM | POA: Diagnosis not present

## 2021-02-09 DIAGNOSIS — N186 End stage renal disease: Secondary | ICD-10-CM | POA: Diagnosis not present

## 2021-02-10 DIAGNOSIS — H524 Presbyopia: Secondary | ICD-10-CM | POA: Diagnosis not present

## 2021-02-10 DIAGNOSIS — H40013 Open angle with borderline findings, low risk, bilateral: Secondary | ICD-10-CM | POA: Diagnosis not present

## 2021-02-10 DIAGNOSIS — H2513 Age-related nuclear cataract, bilateral: Secondary | ICD-10-CM | POA: Diagnosis not present

## 2021-02-11 DIAGNOSIS — Z992 Dependence on renal dialysis: Secondary | ICD-10-CM | POA: Diagnosis not present

## 2021-02-11 DIAGNOSIS — N186 End stage renal disease: Secondary | ICD-10-CM | POA: Diagnosis not present

## 2021-02-12 ENCOUNTER — Encounter (INDEPENDENT_AMBULATORY_CARE_PROVIDER_SITE_OTHER): Payer: Self-pay | Admitting: Nurse Practitioner

## 2021-02-12 NOTE — Progress Notes (Signed)
Subjective:    Patient ID: Lori [name], female    DOB: 04-16-1971, 50 y.o.   MRN: [id number] Chief Complaint  Patient presents with  . Follow-up    [facility] 1 month post le angio    Lori [name] is a 50 year old female that presents today after recent angiogram during her hospitalization.  The patient underwent intervention of the left lower extremity on 12/22/2020.  She underwent intervention on the right lower extremity on 12/30/2020.  Today the patient underwent noninvasive studies which was limited due to the patient being in a wheelchair as well as movement during the test and a painful right lower extremity.  During the patient's hospitalization she also underwent amputation of fourth and fifth toes of the left lower extremity.  The patient also notes that she has blisters along her inner right thigh that happened after the angiogram.  These blisters have ruptured and the area is very hard and painful.  She notes that the amputation sites are improving and denies any other ulcerations.  Today noninvasive studies show noncompressible ABIs bilaterally.  The right TBI is 0.46 with a left of 0.18.  The patient has triphasic tibial artery waveforms in the right anterior tibial with dampened monophasic waveforms in the right posterior tibial.  The right great digit waveform is greatly reduced.  The left lower extremity has strong monophasic waveforms in the tibial arteries but with reduced toe waveforms.   Review of Systems  Cardiovascular: Positive for leg swelling.  Skin: Positive for wound.  All other systems reviewed and are negative.      Objective:   Physical Exam Vitals reviewed.  HENT:     Head: Normocephalic.  Cardiovascular:     Rate and Rhythm: Normal rate.  Pulmonary:     Effort: Pulmonary effort is normal.  Musculoskeletal:     Right lower leg: Edema present.     Left lower leg: Edema present.  Skin:    General: Skin is warm.     Findings: Lesion present.  Neurological:      Mental Status: She is alert and oriented to person, place, and time.     Motor: Weakness present.  Psychiatric:        Mood and Affect: Mood normal.        Behavior: Behavior normal.        Thought Content: Thought content normal.        Judgment: Judgment normal.     There were no vitals taken for this visit.  Past Medical History:  Diagnosis Date  . Anemia   . ASCVD (arteriosclerotic cardiovascular disease)    a. s/p prior stenting of LAD b. NSTEMI in 01/2018 requiring DES x3 to RCA 2/2 spiral dissection from ost->mid RCA but residual dzs in dRCA, LAD & OM2; c. 09/2019 Cath/PCI: LM nl, LAD 50 ISR, 65 p/m, 68m(2.5x15 Resolute Onyx DES), 90d, LCX 99d, OM2 99 (2.25x26 Resolute Onyx DES), RCA 40p/261mSR, 85d, RPDA 100ost, RPAV 50. EF 50-55%.  . Calciphylaxis   . Chronic abdominal wound infection   . COVID-19 virus infection 11/2020  . [place] ([place]  . Diastolic dysfunction    a. 11/2020 Echo: EF 55-60%, mod conc LVh, gr2 DD, nl RV size/fxn. Sev dil LA. Triv MR. Mod-Sev mitral annular Ca2+. Mild-mod Ao sclerosis w/o stenosis.  . Marland KitchenSRD (end stage renal disease) ([place]   Due to membranous GN dialysis 09/1996; peritoneal dialysis --? peitonitis; difficult vascular access-->HD MWF.  . Marland Kitchenangrene of  left foot ([place])    a. 11/2020 L forefoot dry gangrene involving 4th and 5th MTP joints & digits, & R 5th MTP joint.  Marland Kitchen GERD (gastroesophageal reflux disease)   . Hashimoto thyroiditis   . Hyperparathyroidism   . Hypertension   . Hypothyroidism   . Medically noncompliant   . Morbid obesity ([place])   . PAD (peripheral artery disease) ([place])    a. 11/2020 PTA of L peroneal, PTA/thrombectomy, Viabahn stenting x 2 to the L SFA and popliteal arteries (6x251m & 6x1591m.  . Persistent atrial fibrillation ([place]   a. Noted during hospitalization 11/2020.    Social History   Socioeconomic History  . Marital status: Married    Spouse name: Not on file  . Number of children: Not on file  .  Years of education: Not on file  . Highest education level: Not on file  Occupational History  . Not on file  Tobacco Use  . Smoking status: Former SmResearch scientist (life sciences). Smokeless tobacco: Never Used  Vaping Use  . Vaping Use: Never used  Substance and Sexual Activity  . Alcohol use: No  . Drug use: No  . Sexual activity: Yes    Birth control/protection: Surgical  Other Topics Concern  . Not on file  Social History Narrative   Married   No regular exercise   Social Determinants of Health   Financial Resource Strain: Not on file  Food Insecurity: Not on file  Transportation Needs: Not on file  Physical Activity: Not on file  Stress: Not on file  Social Connections: Not on file  Intimate Partner Violence: Not on file    Past Surgical History:  Procedure Laterality Date  . AMPUTATION Left 01/01/2021   Procedure: AMPUTATION RAY-4th & 5th;  Surgeon: FoSamara DeistDPM;  Location: [facility] ORS;  Service: Podiatry;  Laterality: Left;  . [place] CORONARY STENT INTERVENTION N/A 10/21/2019   Procedure: CORONARY STENT INTERVENTION;  Surgeon: ArWellington HampshireMD;  [facility];  Service: Cardiovascular;  Laterality: N/A;  . LEFT HEART CATH AND CORONARY ANGIOGRAPHY N/A 02/11/2018   Procedure: LEFT HEART CATH AND CORONARY ANGIOGRAPHY;  Surgeon: HaCharolette ForwardMD;  [place];  Service: Cardiovascular;  Laterality: N/A;  . LEFT HEART CATH AND CORONARY ANGIOGRAPHY N/A 10/14/2019   Procedure: LEFT HEART CATH AND CORONARY ANGIOGRAPHY;  Surgeon: ArWellington HampshireMD;  [place] [facility];  Service: Cardiovascular;  Laterality: N/A;  . LOWER EXTREMITY ANGIOGRAPHY Left 12/22/2020   Procedure: Lower Extremity Angiography;  Surgeon: DeAlgernon HuxleyMD;  [place];  Service: Cardiovascular;  Laterality: Left;  . LOWER EXTREMITY ANGIOGRAPHY Right 12/30/2020   Procedure: Lower Extremity Angiography;  Surgeon: DeAlgernon HuxleyMD;  Location: [facility];  Service: Cardiovascular;  Laterality: Right;  . THYROIDECTOMY, PARTIAL     Resectin of left lobe with reimplantation in the forearm, small focus of papillary carcinoma incidentally noted at pathology - 2000 and Hashimoto's thyrdoiditis in 2001  . TONSILLECTOMY AND ADENOIDECTOMY      Family History  Problem Relation Age of Onset  . Coronary artery disease Mother   . Kidney disease Father   . Diabetes Sister     Allergies  Allergen Reactions  . Activase [Alteplase] Shortness Of Breath  . Bee Pollen Anaphylaxis  . Warfarin Anaphylaxis  . Warfarin Sodium Nausea And Vomiting and Rash    CBC Latest Ref Rng & Units  01/09/2021 01/04/2021 01/03/2021  WBC 4.0 - 10.5 K/uL 10.7(H) 14.1(H) 15.4(H)  Hemoglobin 12.0 - 15.0 g/dL 8.0(L) 7.8(L) 7.7(L)  Hematocrit 36.0 - 46.0 % 25.2(L) 24.6(L) 24.4(L)  Platelets 150 - 400 K/uL 283 318 279      CMP     Component Value Date/Time   NA 133 (L) 01/04/2021 0527   NA 138 03/26/2014 1425   K 4.6 01/04/2021 0527   K 3.5 03/26/2014 1425   CL 95 (L) 01/04/2021 0527   CL 99 03/26/2014 1425   CO2 23 01/04/2021 0527   CO2 26 03/26/2014 1425   GLUCOSE 78 01/04/2021 0527   GLUCOSE 79 03/26/2014 1425   BUN 36 (H) 01/04/2021 0527   BUN 36 (H) 03/26/2014 1425   CREATININE 6.80 (H) 01/04/2021 0527   CREATININE 9.54 (H) 03/26/2014 1425   CALCIUM 8.1 (L) 01/04/2021 0527   CALCIUM 7.7 (L) 03/26/2014 1425   PROT 6.2 (L) 12/21/2020 0440   PROT 7.7 04/25/2013 1848   ALBUMIN 2.9 (L) 12/30/2020 1148   ALBUMIN 2.5 (L) 08/01/2013 0943   AST 32 12/21/2020 0440   AST 15 04/25/2013 1848   ALT 27 12/21/2020 0440   ALT 12 04/25/2013 1848   ALKPHOS 56 12/21/2020 0440   ALKPHOS 111 04/25/2013 1848   BILITOT 0.7 12/21/2020 0440   BILITOT 0.4 04/25/2013 1848   GFRNONAA 7 (L) 01/04/2021 0527   GFRNONAA 4 (L) 03/26/2014 1425   GFRAA 7 (L) 10/22/2019 0240   GFRAA 5 (L) 03/26/2014 1425     ABI WITH/WO TBI  Result Date: 02/06/2021 LOWER EXTREMITY  DOPPLER STUDY Indications: Rest pain, and ulceration.  Vascular Interventions: 12/22/2020 PTA of Lt peroneal artery, SFA and popliteal.                         Mechanical                         thrombectomy of Lt SFA and prox popliteal artey. Stent                         Lt SFA and popliteal artery.                         12/30/2020 PTA of Rt ATA, SFA and popliteal artery. Stent                         Rt SFA and above knee popliteal and bilateral CIA. Limitations: Today's exam was limited due to patient in wheelchair, involuntary              patient movement and Patient right leg very painful. Performing Technologist: Charlane Ferretti RT (R)(VS)  Examination Guidelines: A complete evaluation includes at minimum, Doppler waveform signals and systolic blood pressure reading at the level of bilateral brachial, anterior tibial, and posterior tibial arteries, when vessel segments are accessible. Bilateral testing is considered an integral part of a complete examination. Photoelectric Plethysmograph (PPG) waveforms and toe systolic pressure readings are included as required and additional duplex testing as needed. Limited examinations for reoccurring indications may be performed as noted.  ABI Findings: +---------+------------------+-----+----------+---------------+ Right    Rt Pressure (mmHg)IndexWaveform  Comment         +---------+------------------+-----+----------+---------------+ Brachial  Dialysis access +---------+------------------+-----+----------+---------------+ ATA      161               1.27 triphasic                 +---------+------------------+-----+----------+---------------+ PTA      33                0.26 monophasic                +---------+------------------+-----+----------+---------------+ Great Toe58                0.46 Abnormal                  +---------+------------------+-----+----------+---------------+  +---------+------------------+-----+----------+-------+ Left     Lt Pressure (mmHg)IndexWaveform  Comment +---------+------------------+-----+----------+-------+ Brachial 127                                      +---------+------------------+-----+----------+-------+ ATA      167               1.31 monophasic        +---------+------------------+-----+----------+-------+ PTA      220               1.73 monophasic        +---------+------------------+-----+----------+-------+ Great Toe23                0.18 Abnormal          +---------+------------------+-----+----------+-------+ Summary: Right: Resting right ankle-brachial index is within normal range. No evidence of significant right lower extremity arterial disease. The right toe-brachial index is abnormal. Although ankle brachial indices are within normal limits (0.95-1.29), arterial Doppler waveforms at the ankle suggest some component of arterial occlusive disease. Left: Resting left ankle-brachial index indicates noncompressible left lower extremity arteries. The left toe-brachial index is abnormal. Although ankle brachial indices are within normal limits (0.95-1.29), arterial Doppler waveforms at the ankle suggest some component of arterial occlusive disease. *See table(s) above for measurements and observations.  Electronically signed by Hortencia Pilar MD on 02/06/2021 at 12:42:17 PM.   Final        Assessment & Plan:   1. PAD (peripheral artery disease) (H[facility]) Today noninvasive studies show that the patient does continue to have some evidence of PAD following intervention.  However her lower extremity wounds appear to be healing at this time.  If the wounds begin to deteriorate or show evidence of stalled healing the patient will likely need a repeat angiogram.  In the absence of any new wounds or ischemic symptoms, we will have the patient return to the office in 3 months for noninvasive studies.  2. Primary  hypertension Continue antihypertensive medications as already ordered, these medications have been reviewed and there are no changes at this time.   3. ESRD on dialysis [facility]) The patient has blisters that formed after angiogram on her right thigh area.  Due to the patient's history of end-stage renal disease it is concerning that this actually may be an early form of calciphylaxis.  We have given the nursing facility prescription for referral to dermatology for further evaluation.   4. Right leg pain The pain may also be related to cellulitis.  Patient was placed on antibiotics for treatment of possible cellulitis.  The patient was also given pain medication for breakthrough pain as she is given pain currently for her knee and lower back symptoms.  The  patient was also given a prescription for referral to dermatology which will be handled by her nursing facility, for follow-up and evaluation for possible calciphylaxis.    Current Outpatient Medications on File Prior to Visit  Medication Sig Dispense Refill  . acetaminophen (TYLENOL) 325 MG tablet Take 650 mg by mouth every 6 (six) hours as needed.    Marland Kitchen albuterol (PROVENTIL HFA;VENTOLIN HFA) 108 (90 BASE) MCG/ACT inhaler Inhale 2 puffs into the lungs every 6 (six) hours as needed. Shortness of breath    . amiodarone (PACERONE) 200 MG tablet Take by mouth.    Marland Kitchen apixaban (ELIQUIS) 5 MG TABS tablet Take 1 tablet (5 mg total) by mouth 2 (two) times daily. 60 tablet 2  . ascorbic acid (VITAMIN C) 500 MG tablet Take 500 mg by mouth 2 (two) times daily.    Marland Kitchen aspirin EC 81 MG tablet Take 81 mg by mouth daily. Swallow whole.    Marland Kitchen atorvastatin (LIPITOR) 40 MG tablet Take 1 tablet (40 mg total) by mouth daily. 30 tablet 0  . b complex-vitamin c-folic acid (NEPHRO-VITE) 0.8 MG TABS tablet Take 1 tablet by mouth daily.    . calcitRIOL (ROCALTROL) 0.25 MCG capsule Take 3 capsules (0.75 mcg total) by mouth every Monday, Wednesday, and Friday with  hemodialysis.    . Calcium Acetate 667 MG TABS Take by mouth in the morning, at noon, in the evening, and at bedtime.    . cyclobenzaprine (FLEXERIL) 10 MG tablet Take 10 mg by mouth 3 (three) times daily.    Marland Kitchen EPINEPHrine 0.3 mg/0.3 mL IJ SOAJ injection Inject 0.3 mg into the muscle as needed.    . folic acid (FOLVITE) A999333 MCG tablet Take 400 mcg by mouth daily.    Marland Kitchen gabapentin (NEURONTIN) 100 MG capsule Take 1 capsule (100 mg total) by mouth at bedtime.    Marland Kitchen HYDROcodone-acetaminophen (NORCO/VICODIN) 5-325 MG tablet TAKE (1) TABLET BY MOUTH EVERY (6) HOURS AS NEEDED. 20 tablet 0  . levothyroxine (SYNTHROID, LEVOTHROID) 300 MCG tablet Take 300 mcg by mouth at bedtime. SYNTHROID ONLY-BRAND NAME MEDICALLY NECESSARY    . metoprolol tartrate (LOPRESSOR) 25 MG tablet Take 1 tablet (25 mg total) by mouth 2 (two) times daily. 60 tablet 0  . Multiple Vitamin (MULTIVITAMIN ADULT PO) Take by mouth.    . nitroGLYCERIN (NITROSTAT) 0.4 MG SL tablet Place 1 tablet (0.4 mg total) under the tongue every 5 (five) minutes x 3 doses as needed for chest pain. 25 tablet 12  . Nutritional Supplements (PROMOD) LIQD Take 30 mLs by mouth in the morning and at bedtime.    . pantoprazole (PROTONIX) 40 MG tablet Take 1 tablet (40 mg total) by mouth at bedtime.    . polyethylene glycol (MIRALAX / GLYCOLAX) 17 g packet Take 17 g by mouth 2 (two) times daily. 14 each 0  . sevelamer carbonate (RENVELA) 800 MG tablet Take 1,600-3,200 mg by mouth 3 (three) times daily with meals. '3200mg'$  with meals and '1600mg'$  with snacks    . simethicone (MYLICON) 80 MG chewable tablet Chew 80 mg by mouth every 6 (six) hours as needed for flatulence.    . vitamin B-12 (CYANOCOBALAMIN) 500 MCG tablet Take 500 mcg by mouth daily.     No current facility-administered medications on file prior to visit.    There are no Patient Instructions on file for this visit. No follow-ups on file.   Kris Hartmann, NP

## 2021-02-14 DIAGNOSIS — N186 End stage renal disease: Secondary | ICD-10-CM | POA: Diagnosis not present

## 2021-02-14 DIAGNOSIS — Z992 Dependence on renal dialysis: Secondary | ICD-10-CM | POA: Diagnosis not present

## 2021-02-15 DIAGNOSIS — L89152 Pressure ulcer of sacral region, stage 2: Secondary | ICD-10-CM | POA: Diagnosis not present

## 2021-02-16 DIAGNOSIS — Z992 Dependence on renal dialysis: Secondary | ICD-10-CM | POA: Diagnosis not present

## 2021-02-16 DIAGNOSIS — N186 End stage renal disease: Secondary | ICD-10-CM | POA: Diagnosis not present

## 2021-02-18 DIAGNOSIS — N186 End stage renal disease: Secondary | ICD-10-CM | POA: Diagnosis not present

## 2021-02-18 DIAGNOSIS — Z992 Dependence on renal dialysis: Secondary | ICD-10-CM | POA: Diagnosis not present

## 2021-02-21 DIAGNOSIS — N186 End stage renal disease: Secondary | ICD-10-CM | POA: Diagnosis not present

## 2021-02-21 DIAGNOSIS — Z992 Dependence on renal dialysis: Secondary | ICD-10-CM | POA: Diagnosis not present

## 2021-02-22 DIAGNOSIS — L97912 Non-pressure chronic ulcer of unspecified part of right lower leg with fat layer exposed: Secondary | ICD-10-CM | POA: Diagnosis not present

## 2021-02-22 DIAGNOSIS — B351 Tinea unguium: Secondary | ICD-10-CM | POA: Diagnosis not present

## 2021-02-22 DIAGNOSIS — L89132 Pressure ulcer of right lower back, stage 2: Secondary | ICD-10-CM | POA: Diagnosis not present

## 2021-02-22 DIAGNOSIS — I7091 Generalized atherosclerosis: Secondary | ICD-10-CM | POA: Diagnosis not present

## 2021-02-22 DIAGNOSIS — Z89422 Acquired absence of other left toe(s): Secondary | ICD-10-CM | POA: Diagnosis not present

## 2021-02-22 DIAGNOSIS — N189 Chronic kidney disease, unspecified: Secondary | ICD-10-CM | POA: Diagnosis not present

## 2021-02-23 DIAGNOSIS — N186 End stage renal disease: Secondary | ICD-10-CM | POA: Diagnosis not present

## 2021-02-23 DIAGNOSIS — Z992 Dependence on renal dialysis: Secondary | ICD-10-CM | POA: Diagnosis not present

## 2021-02-25 DIAGNOSIS — N186 End stage renal disease: Secondary | ICD-10-CM | POA: Diagnosis not present

## 2021-02-25 DIAGNOSIS — Z992 Dependence on renal dialysis: Secondary | ICD-10-CM | POA: Diagnosis not present

## 2021-02-28 DIAGNOSIS — N186 End stage renal disease: Secondary | ICD-10-CM | POA: Diagnosis not present

## 2021-02-28 DIAGNOSIS — Z992 Dependence on renal dialysis: Secondary | ICD-10-CM | POA: Diagnosis not present

## 2021-03-01 DIAGNOSIS — I2510 Atherosclerotic heart disease of native coronary artery without angina pectoris: Secondary | ICD-10-CM | POA: Diagnosis not present

## 2021-03-01 DIAGNOSIS — E785 Hyperlipidemia, unspecified: Secondary | ICD-10-CM | POA: Diagnosis not present

## 2021-03-01 DIAGNOSIS — E213 Hyperparathyroidism, unspecified: Secondary | ICD-10-CM | POA: Diagnosis not present

## 2021-03-01 DIAGNOSIS — I4891 Unspecified atrial fibrillation: Secondary | ICD-10-CM | POA: Diagnosis not present

## 2021-03-01 DIAGNOSIS — Z992 Dependence on renal dialysis: Secondary | ICD-10-CM | POA: Diagnosis not present

## 2021-03-01 DIAGNOSIS — E441 Mild protein-calorie malnutrition: Secondary | ICD-10-CM | POA: Diagnosis not present

## 2021-03-01 DIAGNOSIS — N186 End stage renal disease: Secondary | ICD-10-CM | POA: Diagnosis not present

## 2021-03-01 DIAGNOSIS — I10 Essential (primary) hypertension: Secondary | ICD-10-CM | POA: Diagnosis not present

## 2021-03-02 DIAGNOSIS — Z992 Dependence on renal dialysis: Secondary | ICD-10-CM | POA: Diagnosis not present

## 2021-03-02 DIAGNOSIS — N186 End stage renal disease: Secondary | ICD-10-CM | POA: Diagnosis not present

## 2021-03-04 DIAGNOSIS — Z992 Dependence on renal dialysis: Secondary | ICD-10-CM | POA: Diagnosis not present

## 2021-03-04 DIAGNOSIS — N186 End stage renal disease: Secondary | ICD-10-CM | POA: Diagnosis not present

## 2021-03-07 DIAGNOSIS — Z992 Dependence on renal dialysis: Secondary | ICD-10-CM | POA: Diagnosis not present

## 2021-03-07 DIAGNOSIS — N186 End stage renal disease: Secondary | ICD-10-CM | POA: Diagnosis not present

## 2021-03-09 DIAGNOSIS — N186 End stage renal disease: Secondary | ICD-10-CM | POA: Diagnosis not present

## 2021-03-09 DIAGNOSIS — Z992 Dependence on renal dialysis: Secondary | ICD-10-CM | POA: Diagnosis not present

## 2021-03-11 DIAGNOSIS — Z992 Dependence on renal dialysis: Secondary | ICD-10-CM | POA: Diagnosis not present

## 2021-03-11 DIAGNOSIS — N186 End stage renal disease: Secondary | ICD-10-CM | POA: Diagnosis not present

## 2021-03-13 DIAGNOSIS — M79659 Pain in unspecified thigh: Secondary | ICD-10-CM | POA: Diagnosis not present

## 2021-03-13 DIAGNOSIS — W19XXXA Unspecified fall, initial encounter: Secondary | ICD-10-CM | POA: Diagnosis not present

## 2021-03-14 DIAGNOSIS — Z992 Dependence on renal dialysis: Secondary | ICD-10-CM | POA: Diagnosis not present

## 2021-03-14 DIAGNOSIS — N186 End stage renal disease: Secondary | ICD-10-CM | POA: Diagnosis not present

## 2021-03-16 DIAGNOSIS — N186 End stage renal disease: Secondary | ICD-10-CM | POA: Diagnosis not present

## 2021-03-16 DIAGNOSIS — Z992 Dependence on renal dialysis: Secondary | ICD-10-CM | POA: Diagnosis not present

## 2021-03-18 DIAGNOSIS — N186 End stage renal disease: Secondary | ICD-10-CM | POA: Diagnosis not present

## 2021-03-18 DIAGNOSIS — Z992 Dependence on renal dialysis: Secondary | ICD-10-CM | POA: Diagnosis not present

## 2021-03-21 DIAGNOSIS — N186 End stage renal disease: Secondary | ICD-10-CM | POA: Diagnosis not present

## 2021-03-21 DIAGNOSIS — Z992 Dependence on renal dialysis: Secondary | ICD-10-CM | POA: Diagnosis not present

## 2021-03-22 ENCOUNTER — Encounter: Payer: Self-pay | Admitting: Orthopaedic Surgery

## 2021-03-22 ENCOUNTER — Ambulatory Visit (INDEPENDENT_AMBULATORY_CARE_PROVIDER_SITE_OTHER): Payer: Medicare Other | Admitting: Orthopaedic Surgery

## 2021-03-22 DIAGNOSIS — M25562 Pain in left knee: Secondary | ICD-10-CM

## 2021-03-22 DIAGNOSIS — M25561 Pain in right knee: Secondary | ICD-10-CM

## 2021-03-22 DIAGNOSIS — G8929 Other chronic pain: Secondary | ICD-10-CM | POA: Diagnosis not present

## 2021-03-22 NOTE — Progress Notes (Signed)
PROCEDURE NOTE:  The patient requests injections of the left knee , verbal consent was obtained.  The left knee was prepped appropriately after time out was performed.   Sterile technique was observed and injection of 1 cc of Celestone 6 mg with several cc's of plain xylocaine. Anesthesia was provided by ethyl chloride and a 20-gauge needle was used to inject the knee area. The injection was tolerated well.  A band aid dressing was applied.  The patient was advised to apply ice later today and tomorrow to the injection sight as needed.  PROCEDURE NOTE:  The patient requests injections of the right knee , verbal consent was obtained.  The right knee was prepped appropriately after time out was performed.   Sterile technique was observed and injection of 1 cc of Celestone 6 mg with several cc's of plain xylocaine. Anesthesia was provided by ethyl chloride and a 20-gauge needle was used to inject the knee area. The injection was tolerated well.  A band aid dressing was applied.  The patient was advised to apply ice later today and tomorrow to the injection sight as needed.  Forms for nursing home completed.  She recently had two toes amputated.  Return in six weeks.  Call if any problem.  Precautions discussed.   Electronically Signed Sanjuana Kava, MD 4/27/20229:30 AM

## 2021-03-23 DIAGNOSIS — Z992 Dependence on renal dialysis: Secondary | ICD-10-CM | POA: Diagnosis not present

## 2021-03-23 DIAGNOSIS — N186 End stage renal disease: Secondary | ICD-10-CM | POA: Diagnosis not present

## 2021-03-25 DIAGNOSIS — Z992 Dependence on renal dialysis: Secondary | ICD-10-CM | POA: Diagnosis not present

## 2021-03-25 DIAGNOSIS — N186 End stage renal disease: Secondary | ICD-10-CM | POA: Diagnosis not present

## 2021-03-28 DIAGNOSIS — Z992 Dependence on renal dialysis: Secondary | ICD-10-CM | POA: Diagnosis not present

## 2021-03-28 DIAGNOSIS — N186 End stage renal disease: Secondary | ICD-10-CM | POA: Diagnosis not present

## 2021-03-29 DIAGNOSIS — R278 Other lack of coordination: Secondary | ICD-10-CM | POA: Diagnosis not present

## 2021-03-29 DIAGNOSIS — K922 Gastrointestinal hemorrhage, unspecified: Secondary | ICD-10-CM | POA: Diagnosis not present

## 2021-03-29 DIAGNOSIS — R262 Difficulty in walking, not elsewhere classified: Secondary | ICD-10-CM | POA: Diagnosis not present

## 2021-03-29 DIAGNOSIS — S90212D Contusion of left great toe with damage to nail, subsequent encounter: Secondary | ICD-10-CM | POA: Diagnosis not present

## 2021-03-29 DIAGNOSIS — M6281 Muscle weakness (generalized): Secondary | ICD-10-CM | POA: Diagnosis not present

## 2021-03-29 DIAGNOSIS — L97919 Non-pressure chronic ulcer of unspecified part of right lower leg with unspecified severity: Secondary | ICD-10-CM | POA: Diagnosis not present

## 2021-03-30 DIAGNOSIS — N186 End stage renal disease: Secondary | ICD-10-CM | POA: Diagnosis not present

## 2021-03-30 DIAGNOSIS — R278 Other lack of coordination: Secondary | ICD-10-CM | POA: Diagnosis not present

## 2021-03-30 DIAGNOSIS — Z992 Dependence on renal dialysis: Secondary | ICD-10-CM | POA: Diagnosis not present

## 2021-03-30 DIAGNOSIS — R262 Difficulty in walking, not elsewhere classified: Secondary | ICD-10-CM | POA: Diagnosis not present

## 2021-03-30 DIAGNOSIS — E559 Vitamin D deficiency, unspecified: Secondary | ICD-10-CM | POA: Diagnosis not present

## 2021-03-30 DIAGNOSIS — S90212D Contusion of left great toe with damage to nail, subsequent encounter: Secondary | ICD-10-CM | POA: Diagnosis not present

## 2021-03-30 DIAGNOSIS — M6281 Muscle weakness (generalized): Secondary | ICD-10-CM | POA: Diagnosis not present

## 2021-03-30 DIAGNOSIS — K922 Gastrointestinal hemorrhage, unspecified: Secondary | ICD-10-CM | POA: Diagnosis not present

## 2021-03-31 DIAGNOSIS — G8929 Other chronic pain: Secondary | ICD-10-CM | POA: Diagnosis not present

## 2021-03-31 DIAGNOSIS — S81801D Unspecified open wound, right lower leg, subsequent encounter: Secondary | ICD-10-CM | POA: Diagnosis not present

## 2021-03-31 DIAGNOSIS — Z79899 Other long term (current) drug therapy: Secondary | ICD-10-CM | POA: Diagnosis not present

## 2021-03-31 DIAGNOSIS — S90212D Contusion of left great toe with damage to nail, subsequent encounter: Secondary | ICD-10-CM | POA: Diagnosis not present

## 2021-03-31 DIAGNOSIS — Z8616 Personal history of COVID-19: Secondary | ICD-10-CM | POA: Diagnosis not present

## 2021-03-31 DIAGNOSIS — Z880 Allergy status to penicillin: Secondary | ICD-10-CM | POA: Diagnosis not present

## 2021-03-31 DIAGNOSIS — I10 Essential (primary) hypertension: Secondary | ICD-10-CM | POA: Diagnosis not present

## 2021-03-31 DIAGNOSIS — K922 Gastrointestinal hemorrhage, unspecified: Secondary | ICD-10-CM | POA: Diagnosis not present

## 2021-03-31 DIAGNOSIS — K219 Gastro-esophageal reflux disease without esophagitis: Secondary | ICD-10-CM | POA: Diagnosis not present

## 2021-03-31 DIAGNOSIS — I4891 Unspecified atrial fibrillation: Secondary | ICD-10-CM | POA: Diagnosis not present

## 2021-03-31 DIAGNOSIS — R278 Other lack of coordination: Secondary | ICD-10-CM | POA: Diagnosis not present

## 2021-03-31 DIAGNOSIS — I739 Peripheral vascular disease, unspecified: Secondary | ICD-10-CM | POA: Diagnosis not present

## 2021-03-31 DIAGNOSIS — R262 Difficulty in walking, not elsewhere classified: Secondary | ICD-10-CM | POA: Diagnosis not present

## 2021-03-31 DIAGNOSIS — Z4800 Encounter for change or removal of nonsurgical wound dressing: Secondary | ICD-10-CM | POA: Diagnosis not present

## 2021-03-31 DIAGNOSIS — Z8673 Personal history of transient ischemic attack (TIA), and cerebral infarction without residual deficits: Secondary | ICD-10-CM | POA: Diagnosis not present

## 2021-03-31 DIAGNOSIS — S81801A Unspecified open wound, right lower leg, initial encounter: Secondary | ICD-10-CM | POA: Diagnosis not present

## 2021-03-31 DIAGNOSIS — M6281 Muscle weakness (generalized): Secondary | ICD-10-CM | POA: Diagnosis not present

## 2021-03-31 DIAGNOSIS — E039 Hypothyroidism, unspecified: Secondary | ICD-10-CM | POA: Diagnosis not present

## 2021-03-31 DIAGNOSIS — E785 Hyperlipidemia, unspecified: Secondary | ICD-10-CM | POA: Diagnosis not present

## 2021-03-31 DIAGNOSIS — Z7982 Long term (current) use of aspirin: Secondary | ICD-10-CM | POA: Diagnosis not present

## 2021-03-31 DIAGNOSIS — N185 Chronic kidney disease, stage 5: Secondary | ICD-10-CM | POA: Diagnosis not present

## 2021-04-01 DIAGNOSIS — Z992 Dependence on renal dialysis: Secondary | ICD-10-CM | POA: Diagnosis not present

## 2021-04-01 DIAGNOSIS — N186 End stage renal disease: Secondary | ICD-10-CM | POA: Diagnosis not present

## 2021-04-01 DIAGNOSIS — L089 Local infection of the skin and subcutaneous tissue, unspecified: Secondary | ICD-10-CM | POA: Diagnosis not present

## 2021-04-03 DIAGNOSIS — K922 Gastrointestinal hemorrhage, unspecified: Secondary | ICD-10-CM | POA: Diagnosis not present

## 2021-04-03 DIAGNOSIS — I4891 Unspecified atrial fibrillation: Secondary | ICD-10-CM | POA: Diagnosis not present

## 2021-04-03 DIAGNOSIS — M6281 Muscle weakness (generalized): Secondary | ICD-10-CM | POA: Diagnosis not present

## 2021-04-03 DIAGNOSIS — E441 Mild protein-calorie malnutrition: Secondary | ICD-10-CM | POA: Diagnosis not present

## 2021-04-03 DIAGNOSIS — E785 Hyperlipidemia, unspecified: Secondary | ICD-10-CM | POA: Diagnosis not present

## 2021-04-03 DIAGNOSIS — E213 Hyperparathyroidism, unspecified: Secondary | ICD-10-CM | POA: Diagnosis not present

## 2021-04-03 DIAGNOSIS — I2510 Atherosclerotic heart disease of native coronary artery without angina pectoris: Secondary | ICD-10-CM | POA: Diagnosis not present

## 2021-04-03 DIAGNOSIS — I10 Essential (primary) hypertension: Secondary | ICD-10-CM | POA: Diagnosis not present

## 2021-04-03 DIAGNOSIS — R278 Other lack of coordination: Secondary | ICD-10-CM | POA: Diagnosis not present

## 2021-04-03 DIAGNOSIS — N186 End stage renal disease: Secondary | ICD-10-CM | POA: Diagnosis not present

## 2021-04-03 DIAGNOSIS — S81801D Unspecified open wound, right lower leg, subsequent encounter: Secondary | ICD-10-CM | POA: Diagnosis not present

## 2021-04-03 DIAGNOSIS — R262 Difficulty in walking, not elsewhere classified: Secondary | ICD-10-CM | POA: Diagnosis not present

## 2021-04-03 DIAGNOSIS — S90212D Contusion of left great toe with damage to nail, subsequent encounter: Secondary | ICD-10-CM | POA: Diagnosis not present

## 2021-04-04 DIAGNOSIS — K922 Gastrointestinal hemorrhage, unspecified: Secondary | ICD-10-CM | POA: Diagnosis not present

## 2021-04-04 DIAGNOSIS — R278 Other lack of coordination: Secondary | ICD-10-CM | POA: Diagnosis not present

## 2021-04-04 DIAGNOSIS — S90212D Contusion of left great toe with damage to nail, subsequent encounter: Secondary | ICD-10-CM | POA: Diagnosis not present

## 2021-04-04 DIAGNOSIS — R262 Difficulty in walking, not elsewhere classified: Secondary | ICD-10-CM | POA: Diagnosis not present

## 2021-04-04 DIAGNOSIS — N186 End stage renal disease: Secondary | ICD-10-CM | POA: Diagnosis not present

## 2021-04-04 DIAGNOSIS — Z992 Dependence on renal dialysis: Secondary | ICD-10-CM | POA: Diagnosis not present

## 2021-04-04 DIAGNOSIS — M6281 Muscle weakness (generalized): Secondary | ICD-10-CM | POA: Diagnosis not present

## 2021-04-05 DIAGNOSIS — R278 Other lack of coordination: Secondary | ICD-10-CM | POA: Diagnosis not present

## 2021-04-05 DIAGNOSIS — S90212D Contusion of left great toe with damage to nail, subsequent encounter: Secondary | ICD-10-CM | POA: Diagnosis not present

## 2021-04-05 DIAGNOSIS — M6281 Muscle weakness (generalized): Secondary | ICD-10-CM | POA: Diagnosis not present

## 2021-04-05 DIAGNOSIS — R262 Difficulty in walking, not elsewhere classified: Secondary | ICD-10-CM | POA: Diagnosis not present

## 2021-04-05 DIAGNOSIS — K922 Gastrointestinal hemorrhage, unspecified: Secondary | ICD-10-CM | POA: Diagnosis not present

## 2021-04-06 DIAGNOSIS — R278 Other lack of coordination: Secondary | ICD-10-CM | POA: Diagnosis not present

## 2021-04-06 DIAGNOSIS — R262 Difficulty in walking, not elsewhere classified: Secondary | ICD-10-CM | POA: Diagnosis not present

## 2021-04-06 DIAGNOSIS — M6281 Muscle weakness (generalized): Secondary | ICD-10-CM | POA: Diagnosis not present

## 2021-04-06 DIAGNOSIS — K922 Gastrointestinal hemorrhage, unspecified: Secondary | ICD-10-CM | POA: Diagnosis not present

## 2021-04-06 DIAGNOSIS — N186 End stage renal disease: Secondary | ICD-10-CM | POA: Diagnosis not present

## 2021-04-06 DIAGNOSIS — S90212D Contusion of left great toe with damage to nail, subsequent encounter: Secondary | ICD-10-CM | POA: Diagnosis not present

## 2021-04-06 DIAGNOSIS — Z992 Dependence on renal dialysis: Secondary | ICD-10-CM | POA: Diagnosis not present

## 2021-04-07 DIAGNOSIS — S90212D Contusion of left great toe with damage to nail, subsequent encounter: Secondary | ICD-10-CM | POA: Diagnosis not present

## 2021-04-07 DIAGNOSIS — R278 Other lack of coordination: Secondary | ICD-10-CM | POA: Diagnosis not present

## 2021-04-07 DIAGNOSIS — Z992 Dependence on renal dialysis: Secondary | ICD-10-CM | POA: Diagnosis not present

## 2021-04-07 DIAGNOSIS — Z8673 Personal history of transient ischemic attack (TIA), and cerebral infarction without residual deficits: Secondary | ICD-10-CM | POA: Diagnosis not present

## 2021-04-07 DIAGNOSIS — I96 Gangrene, not elsewhere classified: Secondary | ICD-10-CM | POA: Diagnosis not present

## 2021-04-07 DIAGNOSIS — E785 Hyperlipidemia, unspecified: Secondary | ICD-10-CM | POA: Diagnosis not present

## 2021-04-07 DIAGNOSIS — Z7982 Long term (current) use of aspirin: Secondary | ICD-10-CM | POA: Diagnosis not present

## 2021-04-07 DIAGNOSIS — Z79899 Other long term (current) drug therapy: Secondary | ICD-10-CM | POA: Diagnosis not present

## 2021-04-07 DIAGNOSIS — E890 Postprocedural hypothyroidism: Secondary | ICD-10-CM | POA: Diagnosis not present

## 2021-04-07 DIAGNOSIS — N185 Chronic kidney disease, stage 5: Secondary | ICD-10-CM | POA: Diagnosis not present

## 2021-04-07 DIAGNOSIS — K922 Gastrointestinal hemorrhage, unspecified: Secondary | ICD-10-CM | POA: Diagnosis not present

## 2021-04-07 DIAGNOSIS — G8929 Other chronic pain: Secondary | ICD-10-CM | POA: Diagnosis not present

## 2021-04-07 DIAGNOSIS — Z880 Allergy status to penicillin: Secondary | ICD-10-CM | POA: Diagnosis not present

## 2021-04-07 DIAGNOSIS — I739 Peripheral vascular disease, unspecified: Secondary | ICD-10-CM | POA: Diagnosis not present

## 2021-04-07 DIAGNOSIS — R400 Somnolence: Secondary | ICD-10-CM | POA: Diagnosis not present

## 2021-04-07 DIAGNOSIS — L97919 Non-pressure chronic ulcer of unspecified part of right lower leg with unspecified severity: Secondary | ICD-10-CM | POA: Diagnosis not present

## 2021-04-07 DIAGNOSIS — R262 Difficulty in walking, not elsewhere classified: Secondary | ICD-10-CM | POA: Diagnosis not present

## 2021-04-07 DIAGNOSIS — Z7401 Bed confinement status: Secondary | ICD-10-CM | POA: Diagnosis not present

## 2021-04-07 DIAGNOSIS — R0689 Other abnormalities of breathing: Secondary | ICD-10-CM | POA: Diagnosis not present

## 2021-04-07 DIAGNOSIS — I959 Hypotension, unspecified: Secondary | ICD-10-CM | POA: Diagnosis not present

## 2021-04-07 DIAGNOSIS — I70239 Atherosclerosis of native arteries of right leg with ulceration of unspecified site: Secondary | ICD-10-CM | POA: Diagnosis not present

## 2021-04-07 DIAGNOSIS — L89213 Pressure ulcer of right hip, stage 3: Secondary | ICD-10-CM | POA: Diagnosis not present

## 2021-04-07 DIAGNOSIS — M6281 Muscle weakness (generalized): Secondary | ICD-10-CM | POA: Diagnosis not present

## 2021-04-10 DIAGNOSIS — R278 Other lack of coordination: Secondary | ICD-10-CM | POA: Diagnosis not present

## 2021-04-10 DIAGNOSIS — R262 Difficulty in walking, not elsewhere classified: Secondary | ICD-10-CM | POA: Diagnosis not present

## 2021-04-10 DIAGNOSIS — M6281 Muscle weakness (generalized): Secondary | ICD-10-CM | POA: Diagnosis not present

## 2021-04-10 DIAGNOSIS — K922 Gastrointestinal hemorrhage, unspecified: Secondary | ICD-10-CM | POA: Diagnosis not present

## 2021-04-10 DIAGNOSIS — S90212D Contusion of left great toe with damage to nail, subsequent encounter: Secondary | ICD-10-CM | POA: Diagnosis not present

## 2021-04-11 DIAGNOSIS — N186 End stage renal disease: Secondary | ICD-10-CM | POA: Diagnosis not present

## 2021-04-11 DIAGNOSIS — S90212D Contusion of left great toe with damage to nail, subsequent encounter: Secondary | ICD-10-CM | POA: Diagnosis not present

## 2021-04-11 DIAGNOSIS — K922 Gastrointestinal hemorrhage, unspecified: Secondary | ICD-10-CM | POA: Diagnosis not present

## 2021-04-11 DIAGNOSIS — R278 Other lack of coordination: Secondary | ICD-10-CM | POA: Diagnosis not present

## 2021-04-11 DIAGNOSIS — R262 Difficulty in walking, not elsewhere classified: Secondary | ICD-10-CM | POA: Diagnosis not present

## 2021-04-11 DIAGNOSIS — Z992 Dependence on renal dialysis: Secondary | ICD-10-CM | POA: Diagnosis not present

## 2021-04-11 DIAGNOSIS — M6281 Muscle weakness (generalized): Secondary | ICD-10-CM | POA: Diagnosis not present

## 2021-04-12 DIAGNOSIS — S90212D Contusion of left great toe with damage to nail, subsequent encounter: Secondary | ICD-10-CM | POA: Diagnosis not present

## 2021-04-12 DIAGNOSIS — R278 Other lack of coordination: Secondary | ICD-10-CM | POA: Diagnosis not present

## 2021-04-12 DIAGNOSIS — K922 Gastrointestinal hemorrhage, unspecified: Secondary | ICD-10-CM | POA: Diagnosis not present

## 2021-04-12 DIAGNOSIS — M6281 Muscle weakness (generalized): Secondary | ICD-10-CM | POA: Diagnosis not present

## 2021-04-12 DIAGNOSIS — R262 Difficulty in walking, not elsewhere classified: Secondary | ICD-10-CM | POA: Diagnosis not present

## 2021-04-13 DIAGNOSIS — R262 Difficulty in walking, not elsewhere classified: Secondary | ICD-10-CM | POA: Diagnosis not present

## 2021-04-13 DIAGNOSIS — M6281 Muscle weakness (generalized): Secondary | ICD-10-CM | POA: Diagnosis not present

## 2021-04-13 DIAGNOSIS — R278 Other lack of coordination: Secondary | ICD-10-CM | POA: Diagnosis not present

## 2021-04-13 DIAGNOSIS — S90212D Contusion of left great toe with damage to nail, subsequent encounter: Secondary | ICD-10-CM | POA: Diagnosis not present

## 2021-04-13 DIAGNOSIS — Z992 Dependence on renal dialysis: Secondary | ICD-10-CM | POA: Diagnosis not present

## 2021-04-13 DIAGNOSIS — K922 Gastrointestinal hemorrhage, unspecified: Secondary | ICD-10-CM | POA: Diagnosis not present

## 2021-04-13 DIAGNOSIS — N186 End stage renal disease: Secondary | ICD-10-CM | POA: Diagnosis not present

## 2021-04-14 DIAGNOSIS — M6281 Muscle weakness (generalized): Secondary | ICD-10-CM | POA: Diagnosis not present

## 2021-04-14 DIAGNOSIS — R6889 Other general symptoms and signs: Secondary | ICD-10-CM | POA: Diagnosis not present

## 2021-04-14 DIAGNOSIS — S81801D Unspecified open wound, right lower leg, subsequent encounter: Secondary | ICD-10-CM | POA: Diagnosis not present

## 2021-04-14 DIAGNOSIS — N185 Chronic kidney disease, stage 5: Secondary | ICD-10-CM | POA: Diagnosis not present

## 2021-04-14 DIAGNOSIS — E785 Hyperlipidemia, unspecified: Secondary | ICD-10-CM | POA: Diagnosis not present

## 2021-04-14 DIAGNOSIS — Z743 Need for continuous supervision: Secondary | ICD-10-CM | POA: Diagnosis not present

## 2021-04-14 DIAGNOSIS — Z8673 Personal history of transient ischemic attack (TIA), and cerebral infarction without residual deficits: Secondary | ICD-10-CM | POA: Diagnosis not present

## 2021-04-14 DIAGNOSIS — K922 Gastrointestinal hemorrhage, unspecified: Secondary | ICD-10-CM | POA: Diagnosis not present

## 2021-04-14 DIAGNOSIS — Z7982 Long term (current) use of aspirin: Secondary | ICD-10-CM | POA: Diagnosis not present

## 2021-04-14 DIAGNOSIS — E039 Hypothyroidism, unspecified: Secondary | ICD-10-CM | POA: Diagnosis not present

## 2021-04-14 DIAGNOSIS — G8929 Other chronic pain: Secondary | ICD-10-CM | POA: Diagnosis not present

## 2021-04-14 DIAGNOSIS — K219 Gastro-esophageal reflux disease without esophagitis: Secondary | ICD-10-CM | POA: Diagnosis not present

## 2021-04-14 DIAGNOSIS — Z8616 Personal history of COVID-19: Secondary | ICD-10-CM | POA: Diagnosis not present

## 2021-04-14 DIAGNOSIS — Z79899 Other long term (current) drug therapy: Secondary | ICD-10-CM | POA: Diagnosis not present

## 2021-04-14 DIAGNOSIS — S81801A Unspecified open wound, right lower leg, initial encounter: Secondary | ICD-10-CM | POA: Diagnosis not present

## 2021-04-14 DIAGNOSIS — S90212D Contusion of left great toe with damage to nail, subsequent encounter: Secondary | ICD-10-CM | POA: Diagnosis not present

## 2021-04-14 DIAGNOSIS — R278 Other lack of coordination: Secondary | ICD-10-CM | POA: Diagnosis not present

## 2021-04-14 DIAGNOSIS — I739 Peripheral vascular disease, unspecified: Secondary | ICD-10-CM | POA: Diagnosis not present

## 2021-04-14 DIAGNOSIS — I4891 Unspecified atrial fibrillation: Secondary | ICD-10-CM | POA: Diagnosis not present

## 2021-04-14 DIAGNOSIS — R531 Weakness: Secondary | ICD-10-CM | POA: Diagnosis not present

## 2021-04-14 DIAGNOSIS — Z4800 Encounter for change or removal of nonsurgical wound dressing: Secondary | ICD-10-CM | POA: Diagnosis not present

## 2021-04-14 DIAGNOSIS — Z880 Allergy status to penicillin: Secondary | ICD-10-CM | POA: Diagnosis not present

## 2021-04-14 DIAGNOSIS — R262 Difficulty in walking, not elsewhere classified: Secondary | ICD-10-CM | POA: Diagnosis not present

## 2021-04-15 DIAGNOSIS — N186 End stage renal disease: Secondary | ICD-10-CM | POA: Diagnosis not present

## 2021-04-15 DIAGNOSIS — Z992 Dependence on renal dialysis: Secondary | ICD-10-CM | POA: Diagnosis not present

## 2021-04-17 DIAGNOSIS — K922 Gastrointestinal hemorrhage, unspecified: Secondary | ICD-10-CM | POA: Diagnosis not present

## 2021-04-17 DIAGNOSIS — R262 Difficulty in walking, not elsewhere classified: Secondary | ICD-10-CM | POA: Diagnosis not present

## 2021-04-17 DIAGNOSIS — S90212D Contusion of left great toe with damage to nail, subsequent encounter: Secondary | ICD-10-CM | POA: Diagnosis not present

## 2021-04-17 DIAGNOSIS — R278 Other lack of coordination: Secondary | ICD-10-CM | POA: Diagnosis not present

## 2021-04-17 DIAGNOSIS — M6281 Muscle weakness (generalized): Secondary | ICD-10-CM | POA: Diagnosis not present

## 2021-04-18 DIAGNOSIS — R262 Difficulty in walking, not elsewhere classified: Secondary | ICD-10-CM | POA: Diagnosis not present

## 2021-04-18 DIAGNOSIS — Z992 Dependence on renal dialysis: Secondary | ICD-10-CM | POA: Diagnosis not present

## 2021-04-18 DIAGNOSIS — M6281 Muscle weakness (generalized): Secondary | ICD-10-CM | POA: Diagnosis not present

## 2021-04-18 DIAGNOSIS — K922 Gastrointestinal hemorrhage, unspecified: Secondary | ICD-10-CM | POA: Diagnosis not present

## 2021-04-18 DIAGNOSIS — S90212D Contusion of left great toe with damage to nail, subsequent encounter: Secondary | ICD-10-CM | POA: Diagnosis not present

## 2021-04-18 DIAGNOSIS — N186 End stage renal disease: Secondary | ICD-10-CM | POA: Diagnosis not present

## 2021-04-18 DIAGNOSIS — R278 Other lack of coordination: Secondary | ICD-10-CM | POA: Diagnosis not present

## 2021-04-19 DIAGNOSIS — R278 Other lack of coordination: Secondary | ICD-10-CM | POA: Diagnosis not present

## 2021-04-19 DIAGNOSIS — L89132 Pressure ulcer of right lower back, stage 2: Secondary | ICD-10-CM | POA: Diagnosis not present

## 2021-04-19 DIAGNOSIS — M6281 Muscle weakness (generalized): Secondary | ICD-10-CM | POA: Diagnosis not present

## 2021-04-19 DIAGNOSIS — K922 Gastrointestinal hemorrhage, unspecified: Secondary | ICD-10-CM | POA: Diagnosis not present

## 2021-04-19 DIAGNOSIS — R262 Difficulty in walking, not elsewhere classified: Secondary | ICD-10-CM | POA: Diagnosis not present

## 2021-04-19 DIAGNOSIS — S90212D Contusion of left great toe with damage to nail, subsequent encounter: Secondary | ICD-10-CM | POA: Diagnosis not present

## 2021-04-20 DIAGNOSIS — S90212D Contusion of left great toe with damage to nail, subsequent encounter: Secondary | ICD-10-CM | POA: Diagnosis not present

## 2021-04-20 DIAGNOSIS — K922 Gastrointestinal hemorrhage, unspecified: Secondary | ICD-10-CM | POA: Diagnosis not present

## 2021-04-20 DIAGNOSIS — Z992 Dependence on renal dialysis: Secondary | ICD-10-CM | POA: Diagnosis not present

## 2021-04-20 DIAGNOSIS — N186 End stage renal disease: Secondary | ICD-10-CM | POA: Diagnosis not present

## 2021-04-20 DIAGNOSIS — R262 Difficulty in walking, not elsewhere classified: Secondary | ICD-10-CM | POA: Diagnosis not present

## 2021-04-20 DIAGNOSIS — R278 Other lack of coordination: Secondary | ICD-10-CM | POA: Diagnosis not present

## 2021-04-20 DIAGNOSIS — M6281 Muscle weakness (generalized): Secondary | ICD-10-CM | POA: Diagnosis not present

## 2021-04-21 DIAGNOSIS — Z880 Allergy status to penicillin: Secondary | ICD-10-CM | POA: Diagnosis not present

## 2021-04-21 DIAGNOSIS — S81801A Unspecified open wound, right lower leg, initial encounter: Secondary | ICD-10-CM | POA: Diagnosis not present

## 2021-04-21 DIAGNOSIS — Z7982 Long term (current) use of aspirin: Secondary | ICD-10-CM | POA: Diagnosis not present

## 2021-04-21 DIAGNOSIS — E039 Hypothyroidism, unspecified: Secondary | ICD-10-CM | POA: Diagnosis not present

## 2021-04-21 DIAGNOSIS — S90212D Contusion of left great toe with damage to nail, subsequent encounter: Secondary | ICD-10-CM | POA: Diagnosis not present

## 2021-04-21 DIAGNOSIS — K219 Gastro-esophageal reflux disease without esophagitis: Secondary | ICD-10-CM | POA: Diagnosis not present

## 2021-04-21 DIAGNOSIS — M6281 Muscle weakness (generalized): Secondary | ICD-10-CM | POA: Diagnosis not present

## 2021-04-21 DIAGNOSIS — N185 Chronic kidney disease, stage 5: Secondary | ICD-10-CM | POA: Diagnosis not present

## 2021-04-21 DIAGNOSIS — I4891 Unspecified atrial fibrillation: Secondary | ICD-10-CM | POA: Diagnosis not present

## 2021-04-21 DIAGNOSIS — R278 Other lack of coordination: Secondary | ICD-10-CM | POA: Diagnosis not present

## 2021-04-21 DIAGNOSIS — Z8616 Personal history of COVID-19: Secondary | ICD-10-CM | POA: Diagnosis not present

## 2021-04-21 DIAGNOSIS — Z8673 Personal history of transient ischemic attack (TIA), and cerebral infarction without residual deficits: Secondary | ICD-10-CM | POA: Diagnosis not present

## 2021-04-21 DIAGNOSIS — G8929 Other chronic pain: Secondary | ICD-10-CM | POA: Diagnosis not present

## 2021-04-21 DIAGNOSIS — R52 Pain, unspecified: Secondary | ICD-10-CM | POA: Diagnosis not present

## 2021-04-21 DIAGNOSIS — Z79899 Other long term (current) drug therapy: Secondary | ICD-10-CM | POA: Diagnosis not present

## 2021-04-21 DIAGNOSIS — I739 Peripheral vascular disease, unspecified: Secondary | ICD-10-CM | POA: Diagnosis not present

## 2021-04-21 DIAGNOSIS — R531 Weakness: Secondary | ICD-10-CM | POA: Diagnosis not present

## 2021-04-21 DIAGNOSIS — E785 Hyperlipidemia, unspecified: Secondary | ICD-10-CM | POA: Diagnosis not present

## 2021-04-21 DIAGNOSIS — S81801D Unspecified open wound, right lower leg, subsequent encounter: Secondary | ICD-10-CM | POA: Diagnosis not present

## 2021-04-21 DIAGNOSIS — K922 Gastrointestinal hemorrhage, unspecified: Secondary | ICD-10-CM | POA: Diagnosis not present

## 2021-04-21 DIAGNOSIS — Z4800 Encounter for change or removal of nonsurgical wound dressing: Secondary | ICD-10-CM | POA: Diagnosis not present

## 2021-04-21 DIAGNOSIS — Z7401 Bed confinement status: Secondary | ICD-10-CM | POA: Diagnosis not present

## 2021-04-21 DIAGNOSIS — R262 Difficulty in walking, not elsewhere classified: Secondary | ICD-10-CM | POA: Diagnosis not present

## 2021-04-22 DIAGNOSIS — Z992 Dependence on renal dialysis: Secondary | ICD-10-CM | POA: Diagnosis not present

## 2021-04-22 DIAGNOSIS — N186 End stage renal disease: Secondary | ICD-10-CM | POA: Diagnosis not present

## 2021-04-24 DIAGNOSIS — S90212D Contusion of left great toe with damage to nail, subsequent encounter: Secondary | ICD-10-CM | POA: Diagnosis not present

## 2021-04-24 DIAGNOSIS — K922 Gastrointestinal hemorrhage, unspecified: Secondary | ICD-10-CM | POA: Diagnosis not present

## 2021-04-24 DIAGNOSIS — M6281 Muscle weakness (generalized): Secondary | ICD-10-CM | POA: Diagnosis not present

## 2021-04-24 DIAGNOSIS — R278 Other lack of coordination: Secondary | ICD-10-CM | POA: Diagnosis not present

## 2021-04-24 DIAGNOSIS — R262 Difficulty in walking, not elsewhere classified: Secondary | ICD-10-CM | POA: Diagnosis not present

## 2021-04-25 DIAGNOSIS — Z992 Dependence on renal dialysis: Secondary | ICD-10-CM | POA: Diagnosis not present

## 2021-04-25 DIAGNOSIS — S90212D Contusion of left great toe with damage to nail, subsequent encounter: Secondary | ICD-10-CM | POA: Diagnosis not present

## 2021-04-25 DIAGNOSIS — K922 Gastrointestinal hemorrhage, unspecified: Secondary | ICD-10-CM | POA: Diagnosis not present

## 2021-04-25 DIAGNOSIS — R278 Other lack of coordination: Secondary | ICD-10-CM | POA: Diagnosis not present

## 2021-04-25 DIAGNOSIS — N186 End stage renal disease: Secondary | ICD-10-CM | POA: Diagnosis not present

## 2021-04-25 DIAGNOSIS — R262 Difficulty in walking, not elsewhere classified: Secondary | ICD-10-CM | POA: Diagnosis not present

## 2021-04-25 DIAGNOSIS — M6281 Muscle weakness (generalized): Secondary | ICD-10-CM | POA: Diagnosis not present

## 2021-04-26 DIAGNOSIS — I7091 Generalized atherosclerosis: Secondary | ICD-10-CM | POA: Diagnosis not present

## 2021-04-26 DIAGNOSIS — R262 Difficulty in walking, not elsewhere classified: Secondary | ICD-10-CM | POA: Diagnosis not present

## 2021-04-26 DIAGNOSIS — S98212D Complete traumatic amputation of two or more left lesser toes, subsequent encounter: Secondary | ICD-10-CM | POA: Diagnosis not present

## 2021-04-26 DIAGNOSIS — L89132 Pressure ulcer of right lower back, stage 2: Secondary | ICD-10-CM | POA: Diagnosis not present

## 2021-04-26 DIAGNOSIS — M6281 Muscle weakness (generalized): Secondary | ICD-10-CM | POA: Diagnosis not present

## 2021-04-26 DIAGNOSIS — B351 Tinea unguium: Secondary | ICD-10-CM | POA: Diagnosis not present

## 2021-04-27 DIAGNOSIS — R262 Difficulty in walking, not elsewhere classified: Secondary | ICD-10-CM | POA: Diagnosis not present

## 2021-04-27 DIAGNOSIS — S98212D Complete traumatic amputation of two or more left lesser toes, subsequent encounter: Secondary | ICD-10-CM | POA: Diagnosis not present

## 2021-04-27 DIAGNOSIS — Z992 Dependence on renal dialysis: Secondary | ICD-10-CM | POA: Diagnosis not present

## 2021-04-27 DIAGNOSIS — N186 End stage renal disease: Secondary | ICD-10-CM | POA: Diagnosis not present

## 2021-04-27 DIAGNOSIS — M6281 Muscle weakness (generalized): Secondary | ICD-10-CM | POA: Diagnosis not present

## 2021-04-28 ENCOUNTER — Other Ambulatory Visit (INDEPENDENT_AMBULATORY_CARE_PROVIDER_SITE_OTHER): Payer: Self-pay | Admitting: Vascular Surgery

## 2021-04-28 DIAGNOSIS — I739 Peripheral vascular disease, unspecified: Secondary | ICD-10-CM | POA: Diagnosis not present

## 2021-04-28 DIAGNOSIS — R279 Unspecified lack of coordination: Secondary | ICD-10-CM | POA: Diagnosis not present

## 2021-04-28 DIAGNOSIS — N185 Chronic kidney disease, stage 5: Secondary | ICD-10-CM | POA: Diagnosis not present

## 2021-04-28 DIAGNOSIS — Z743 Need for continuous supervision: Secondary | ICD-10-CM | POA: Diagnosis not present

## 2021-04-28 DIAGNOSIS — L97912 Non-pressure chronic ulcer of unspecified part of right lower leg with fat layer exposed: Secondary | ICD-10-CM | POA: Diagnosis not present

## 2021-04-28 DIAGNOSIS — K219 Gastro-esophageal reflux disease without esophagitis: Secondary | ICD-10-CM | POA: Diagnosis not present

## 2021-04-28 DIAGNOSIS — E785 Hyperlipidemia, unspecified: Secondary | ICD-10-CM | POA: Diagnosis not present

## 2021-04-28 DIAGNOSIS — Z7982 Long term (current) use of aspirin: Secondary | ICD-10-CM | POA: Diagnosis not present

## 2021-04-28 DIAGNOSIS — Z880 Allergy status to penicillin: Secondary | ICD-10-CM | POA: Diagnosis not present

## 2021-04-28 DIAGNOSIS — S81801D Unspecified open wound, right lower leg, subsequent encounter: Secondary | ICD-10-CM | POA: Diagnosis not present

## 2021-04-28 DIAGNOSIS — R262 Difficulty in walking, not elsewhere classified: Secondary | ICD-10-CM | POA: Diagnosis not present

## 2021-04-28 DIAGNOSIS — Z95820 Peripheral vascular angioplasty status with implants and grafts: Secondary | ICD-10-CM

## 2021-04-28 DIAGNOSIS — Z79899 Other long term (current) drug therapy: Secondary | ICD-10-CM | POA: Diagnosis not present

## 2021-04-28 DIAGNOSIS — Z8616 Personal history of COVID-19: Secondary | ICD-10-CM | POA: Diagnosis not present

## 2021-04-28 DIAGNOSIS — L89212 Pressure ulcer of right hip, stage 2: Secondary | ICD-10-CM | POA: Diagnosis not present

## 2021-04-28 DIAGNOSIS — R69 Illness, unspecified: Secondary | ICD-10-CM | POA: Diagnosis not present

## 2021-04-28 DIAGNOSIS — I4891 Unspecified atrial fibrillation: Secondary | ICD-10-CM | POA: Diagnosis not present

## 2021-04-28 DIAGNOSIS — M6281 Muscle weakness (generalized): Secondary | ICD-10-CM | POA: Diagnosis not present

## 2021-04-28 DIAGNOSIS — R5381 Other malaise: Secondary | ICD-10-CM | POA: Diagnosis not present

## 2021-04-28 DIAGNOSIS — Z8673 Personal history of transient ischemic attack (TIA), and cerebral infarction without residual deficits: Secondary | ICD-10-CM | POA: Diagnosis not present

## 2021-04-28 DIAGNOSIS — R52 Pain, unspecified: Secondary | ICD-10-CM | POA: Diagnosis not present

## 2021-04-28 DIAGNOSIS — S98212D Complete traumatic amputation of two or more left lesser toes, subsequent encounter: Secondary | ICD-10-CM | POA: Diagnosis not present

## 2021-04-28 DIAGNOSIS — E039 Hypothyroidism, unspecified: Secondary | ICD-10-CM | POA: Diagnosis not present

## 2021-04-28 DIAGNOSIS — G8929 Other chronic pain: Secondary | ICD-10-CM | POA: Diagnosis not present

## 2021-04-28 DIAGNOSIS — Z4800 Encounter for change or removal of nonsurgical wound dressing: Secondary | ICD-10-CM | POA: Diagnosis not present

## 2021-05-01 ENCOUNTER — Other Ambulatory Visit: Payer: Self-pay

## 2021-05-01 ENCOUNTER — Ambulatory Visit (INDEPENDENT_AMBULATORY_CARE_PROVIDER_SITE_OTHER): Payer: Medicare Other | Admitting: Nurse Practitioner

## 2021-05-01 ENCOUNTER — Ambulatory Visit (INDEPENDENT_AMBULATORY_CARE_PROVIDER_SITE_OTHER): Payer: Medicare Other

## 2021-05-01 ENCOUNTER — Encounter (INDEPENDENT_AMBULATORY_CARE_PROVIDER_SITE_OTHER): Payer: Self-pay | Admitting: Nurse Practitioner

## 2021-05-01 ENCOUNTER — Encounter (INDEPENDENT_AMBULATORY_CARE_PROVIDER_SITE_OTHER): Payer: Self-pay

## 2021-05-01 DIAGNOSIS — R262 Difficulty in walking, not elsewhere classified: Secondary | ICD-10-CM | POA: Diagnosis not present

## 2021-05-01 DIAGNOSIS — I10 Essential (primary) hypertension: Secondary | ICD-10-CM

## 2021-05-01 DIAGNOSIS — I739 Peripheral vascular disease, unspecified: Secondary | ICD-10-CM

## 2021-05-01 DIAGNOSIS — Z95820 Peripheral vascular angioplasty status with implants and grafts: Secondary | ICD-10-CM | POA: Diagnosis not present

## 2021-05-01 DIAGNOSIS — E785 Hyperlipidemia, unspecified: Secondary | ICD-10-CM

## 2021-05-01 DIAGNOSIS — M6281 Muscle weakness (generalized): Secondary | ICD-10-CM | POA: Diagnosis not present

## 2021-05-01 DIAGNOSIS — S98212D Complete traumatic amputation of two or more left lesser toes, subsequent encounter: Secondary | ICD-10-CM | POA: Diagnosis not present

## 2021-05-02 ENCOUNTER — Telehealth (INDEPENDENT_AMBULATORY_CARE_PROVIDER_SITE_OTHER): Payer: Self-pay | Admitting: Vascular Surgery

## 2021-05-02 DIAGNOSIS — R262 Difficulty in walking, not elsewhere classified: Secondary | ICD-10-CM | POA: Diagnosis not present

## 2021-05-02 DIAGNOSIS — M6281 Muscle weakness (generalized): Secondary | ICD-10-CM | POA: Diagnosis not present

## 2021-05-02 DIAGNOSIS — N186 End stage renal disease: Secondary | ICD-10-CM | POA: Diagnosis not present

## 2021-05-02 DIAGNOSIS — S98212D Complete traumatic amputation of two or more left lesser toes, subsequent encounter: Secondary | ICD-10-CM | POA: Diagnosis not present

## 2021-05-02 DIAGNOSIS — Z992 Dependence on renal dialysis: Secondary | ICD-10-CM | POA: Diagnosis not present

## 2021-05-02 NOTE — Telephone Encounter (Signed)
Documentation only.

## 2021-05-03 ENCOUNTER — Ambulatory Visit: Payer: Medicare Other | Admitting: Orthopaedic Surgery

## 2021-05-03 DIAGNOSIS — R279 Unspecified lack of coordination: Secondary | ICD-10-CM | POA: Diagnosis not present

## 2021-05-03 DIAGNOSIS — I4510 Unspecified right bundle-branch block: Secondary | ICD-10-CM | POA: Diagnosis not present

## 2021-05-03 DIAGNOSIS — G8929 Other chronic pain: Secondary | ICD-10-CM | POA: Diagnosis not present

## 2021-05-03 DIAGNOSIS — E785 Hyperlipidemia, unspecified: Secondary | ICD-10-CM | POA: Diagnosis not present

## 2021-05-03 DIAGNOSIS — L97912 Non-pressure chronic ulcer of unspecified part of right lower leg with fat layer exposed: Secondary | ICD-10-CM | POA: Diagnosis not present

## 2021-05-03 DIAGNOSIS — S98212D Complete traumatic amputation of two or more left lesser toes, subsequent encounter: Secondary | ICD-10-CM | POA: Diagnosis not present

## 2021-05-03 DIAGNOSIS — R5381 Other malaise: Secondary | ICD-10-CM | POA: Diagnosis not present

## 2021-05-03 DIAGNOSIS — L97919 Non-pressure chronic ulcer of unspecified part of right lower leg with unspecified severity: Secondary | ICD-10-CM | POA: Diagnosis not present

## 2021-05-03 DIAGNOSIS — I4891 Unspecified atrial fibrillation: Secondary | ICD-10-CM | POA: Diagnosis not present

## 2021-05-03 DIAGNOSIS — I70238 Atherosclerosis of native arteries of right leg with ulceration of other part of lower right leg: Secondary | ICD-10-CM | POA: Diagnosis not present

## 2021-05-03 DIAGNOSIS — L22 Diaper dermatitis: Secondary | ICD-10-CM | POA: Diagnosis not present

## 2021-05-03 DIAGNOSIS — M6281 Muscle weakness (generalized): Secondary | ICD-10-CM | POA: Diagnosis not present

## 2021-05-03 DIAGNOSIS — E039 Hypothyroidism, unspecified: Secondary | ICD-10-CM | POA: Diagnosis not present

## 2021-05-03 DIAGNOSIS — Z7982 Long term (current) use of aspirin: Secondary | ICD-10-CM | POA: Diagnosis not present

## 2021-05-03 DIAGNOSIS — Z79899 Other long term (current) drug therapy: Secondary | ICD-10-CM | POA: Diagnosis not present

## 2021-05-03 DIAGNOSIS — I739 Peripheral vascular disease, unspecified: Secondary | ICD-10-CM | POA: Diagnosis not present

## 2021-05-03 DIAGNOSIS — L97818 Non-pressure chronic ulcer of other part of right lower leg with other specified severity: Secondary | ICD-10-CM | POA: Diagnosis not present

## 2021-05-03 DIAGNOSIS — N185 Chronic kidney disease, stage 5: Secondary | ICD-10-CM | POA: Diagnosis not present

## 2021-05-03 DIAGNOSIS — Z7401 Bed confinement status: Secondary | ICD-10-CM | POA: Diagnosis not present

## 2021-05-03 DIAGNOSIS — R9431 Abnormal electrocardiogram [ECG] [EKG]: Secondary | ICD-10-CM | POA: Diagnosis not present

## 2021-05-03 DIAGNOSIS — R262 Difficulty in walking, not elsewhere classified: Secondary | ICD-10-CM | POA: Diagnosis not present

## 2021-05-04 DIAGNOSIS — N186 End stage renal disease: Secondary | ICD-10-CM | POA: Diagnosis not present

## 2021-05-04 DIAGNOSIS — Z992 Dependence on renal dialysis: Secondary | ICD-10-CM | POA: Diagnosis not present

## 2021-05-04 DIAGNOSIS — R262 Difficulty in walking, not elsewhere classified: Secondary | ICD-10-CM | POA: Diagnosis not present

## 2021-05-04 DIAGNOSIS — M6281 Muscle weakness (generalized): Secondary | ICD-10-CM | POA: Diagnosis not present

## 2021-05-04 DIAGNOSIS — S98212D Complete traumatic amputation of two or more left lesser toes, subsequent encounter: Secondary | ICD-10-CM | POA: Diagnosis not present

## 2021-05-05 DIAGNOSIS — R262 Difficulty in walking, not elsewhere classified: Secondary | ICD-10-CM | POA: Diagnosis not present

## 2021-05-05 DIAGNOSIS — S98212D Complete traumatic amputation of two or more left lesser toes, subsequent encounter: Secondary | ICD-10-CM | POA: Diagnosis not present

## 2021-05-05 DIAGNOSIS — M6281 Muscle weakness (generalized): Secondary | ICD-10-CM | POA: Diagnosis not present

## 2021-05-06 DIAGNOSIS — Z992 Dependence on renal dialysis: Secondary | ICD-10-CM | POA: Diagnosis not present

## 2021-05-06 DIAGNOSIS — N186 End stage renal disease: Secondary | ICD-10-CM | POA: Diagnosis not present

## 2021-05-09 DIAGNOSIS — Z992 Dependence on renal dialysis: Secondary | ICD-10-CM | POA: Diagnosis not present

## 2021-05-09 DIAGNOSIS — N186 End stage renal disease: Secondary | ICD-10-CM | POA: Diagnosis not present

## 2021-05-10 ENCOUNTER — Encounter (INDEPENDENT_AMBULATORY_CARE_PROVIDER_SITE_OTHER): Payer: Self-pay | Admitting: Nurse Practitioner

## 2021-05-10 ENCOUNTER — Ambulatory Visit (INDEPENDENT_AMBULATORY_CARE_PROVIDER_SITE_OTHER): Payer: Medicare Other | Admitting: Nurse Practitioner

## 2021-05-10 ENCOUNTER — Encounter (INDEPENDENT_AMBULATORY_CARE_PROVIDER_SITE_OTHER): Payer: Medicare Other

## 2021-05-10 NOTE — Progress Notes (Signed)
Subjective:    Patient ID: Lori [name], female    DOB: 01/05/1971, 50 y.o.   MRN: [id number] Chief Complaint  Patient presents with  . Follow-up    3 month ultrasound follow up    The patient returns to the office for followup and review of the noninvasive studies. There have been no interval changes in lower extremity symptoms. No interval shortening of the patient's claudication distance or development of rest pain symptoms.  Since last visit however the patient has developed calciphylaxis and has underwent recent debridement of her thigh on her right lower extremity.  This is very painful for the patient and history taking is somewhat difficult.  There have been no significant changes to the patient's overall health care.  The patient denies amaurosis fugax or recent TIA symptoms. There are no recent neurological changes noted. The patient denies history of DVT, PE or superficial thrombophlebitis. The patient denies recent episodes of angina or shortness of breath.   The bilateral ABIs are noncompressible and due to positioning TBI's were not obtained.  Imaging of the patient's tibial arteries was limited also due to dressings.   Review of Systems  Skin:  Positive for wound.  Neurological:  Positive for weakness.  All other systems reviewed and are negative.     Objective:   Physical Exam Vitals reviewed.  HENT:     Head: Normocephalic.  Cardiovascular:     Rate and Rhythm: Normal rate.  Pulmonary:     Effort: Pulmonary effort is normal.  Skin:    Findings: No wound.  Neurological:     Mental Status: She is alert and oriented to person, place, and time.     Motor: Weakness present.  Psychiatric:        Attention and Perception: She is inattentive.        Mood and Affect: Mood normal.        Behavior: Behavior normal.        Thought Content: Thought content normal.        Judgment: Judgment normal.    There were no vitals taken for this visit.  Past Medical  History:  Diagnosis Date  . Anemia   . ASCVD (arteriosclerotic cardiovascular disease)    a. s/p prior stenting of LAD b. NSTEMI in 01/2018 requiring DES x3 to RCA 2/2 spiral dissection from ost->mid RCA but residual dzs in dRCA, LAD & OM2; c. 09/2019 Cath/PCI: LM nl, LAD 50 ISR, 65 p/m, 56m(2.5x15 Resolute Onyx DES), 90d, LCX 99d, OM2 99 (2.25x26 Resolute Onyx DES), RCA 40p/214mSR, 85d, RPDA 100ost, RPAV 50. EF 50-55%.  . Calciphylaxis   . Chronic abdominal wound infection   . COVID-19 virus infection 11/2020  . Dialysis patient ([facility]  . Diastolic dysfunction    a. 11/2020 Echo: EF 55-60%, mod conc LVh, gr2 DD, nl RV size/fxn. Sev dil LA. Triv MR. Mod-Sev mitral annular Ca2+. Mild-mod Ao sclerosis w/o stenosis.  . Marland KitchenSRD (end stage renal disease) ([facility]   Due to membranous GN dialysis 09/1996; peritoneal dialysis --? peitonitis; difficult vascular access-->HD MWF.  . Marland Kitchenangrene of left foot ([facility]   a. 11/2020 L forefoot dry gangrene involving 4th and 5th MTP joints & digits, & R 5th MTP joint.  . Marland KitchenERD (gastroesophageal reflux disease)   . Hashimoto thyroiditis   . Hyperparathyroidism   . Hypertension   . Hypothyroidism   . Medically noncompliant   . Morbid obesity ([facility]  . PAD (peripheral artery  disease) ([place])    a. 11/2020 PTA of L peroneal, PTA/thrombectomy, Viabahn stenting x 2 to the L SFA and popliteal arteries (6x24m & 6x1547m.  . Persistent atrial fibrillation ([facility]   a. Noted during hospitalization 11/2020.    Social History   Socioeconomic History  . Marital status: Married    Spouse name: Not on file  . Number of children: Not on file  . Years of education: Not on file  . Highest education level: Not on file  Occupational History  . Not on file  Tobacco Use  . Smoking status: Former    Pack years: 0.00  . Smokeless tobacco: Never  Vaping Use  . Vaping Use: Never used  Substance and Sexual Activity  . Alcohol use: No  . Drug use: No  . Sexual activity: Yes     Birth control/protection: Surgical  Other Topics Concern  . Not on file  Social History Narrative   Married   No regular exercise   Social Determinants of Health   Financial Resource Strain: Not on file  Food Insecurity: Not on file  Transportation Needs: Not on file  Physical Activity: Not on file  Stress: Not on file  Social Connections: Not on file  Intimate Partner Violence: Not on file    Past Surgical History:  Procedure Laterality Date  . AMPUTATION Left 01/01/2021   Procedure: AMPUTATION RAY-4th & 5th;  Surgeon: FoSamara DeistDPM;  Location: [facility] ORS;  Service: Podiatry;  Laterality: Left;  . BTTexhoma. CORONARY STENT INTERVENTION N/A 10/21/2019   Procedure: CORONARY STENT INTERVENTION;  Surgeon: ArWellington HampshireMD;  [facility];  Service: Cardiovascular;  Laterality: N/A;  . LEFT HEART CATH AND CORONARY ANGIOGRAPHY N/A 02/11/2018   Procedure: LEFT HEART CATH AND CORONARY ANGIOGRAPHY;  Surgeon: HaCharolette ForwardMD;  [facility];  Service: Cardiovascular;  Laterality: N/A;  . LEFT HEART CATH AND CORONARY ANGIOGRAPHY N/A 10/14/2019   Procedure: LEFT HEART CATH AND CORONARY ANGIOGRAPHY;  Surgeon: ArWellington HampshireMD;  [facility];  Service: Cardiovascular;  Laterality: N/A;  . LOWER EXTREMITY ANGIOGRAPHY Left 12/22/2020   Procedure: Lower Extremity Angiography;  Surgeon: DeAlgernon HuxleyMD;  [facility];  Service: Cardiovascular;  Laterality: Left;  . LOWER EXTREMITY ANGIOGRAPHY Right 12/30/2020   Procedure: Lower Extremity Angiography;  Surgeon: DeAlgernon HuxleyMD;  Location: [facility];  Service: Cardiovascular;  Laterality: Right;  . THYROIDECTOMY, PARTIAL     Resectin of left lobe with reimplantation in the forearm, small focus of papillary carcinoma incidentally noted at pathology - 2000 and Hashimoto's thyrdoiditis in 2001  . TONSILLECTOMY AND ADENOIDECTOMY      Family History  Problem  Relation Age of Onset  . Coronary artery disease Mother   . Kidney disease Father   . Diabetes Sister     Allergies  Allergen Reactions  . Alteplase Shortness Of Breath and Anaphylaxis  . Bee Pollen Anaphylaxis  . Bee Venom Anaphylaxis  . Warfarin Anaphylaxis  . Warfarin Sodium Nausea And Vomiting and Rash    CBC Latest Ref Rng & Units 01/09/2021 01/04/2021 01/03/2021  WBC 4.0 - 10.5 K/uL 10.7(H) 14.1(H) 15.4(H)  Hemoglobin 12.0 - 15.0 g/dL 8.0(L) 7.8(L) 7.7(L)  Hematocrit 36.0 - 46.0 % 25.2(L) 24.6(L) 24.4(L)  Platelets 150 - 400 K/uL 283 318 279      CMP     Component Value Date/Time   NA  133 (L) 01/04/2021 0527   NA 138 03/26/2014 1425   K 4.6 01/04/2021 0527   K 3.5 03/26/2014 1425   CL 95 (L) 01/04/2021 0527   CL 99 03/26/2014 1425   CO2 23 01/04/2021 0527   CO2 26 03/26/2014 1425   GLUCOSE 78 01/04/2021 0527   GLUCOSE 79 03/26/2014 1425   BUN 36 (H) 01/04/2021 0527   BUN 36 (H) 03/26/2014 1425   CREATININE 6.80 (H) 01/04/2021 0527   CREATININE 9.54 (H) 03/26/2014 1425   CALCIUM 8.1 (L) 01/04/2021 0527   CALCIUM 7.7 (L) 03/26/2014 1425   PROT 6.2 (L) 12/21/2020 0440   PROT 7.7 04/25/2013 1848   ALBUMIN 2.9 (L) 12/30/2020 1148   ALBUMIN 2.5 (L) 08/01/2013 0943   AST 32 12/21/2020 0440   AST 15 04/25/2013 1848   ALT 27 12/21/2020 0440   ALT 12 04/25/2013 1848   ALKPHOS 56 12/21/2020 0440   ALKPHOS 111 04/25/2013 1848   BILITOT 0.7 12/21/2020 0440   BILITOT 0.4 04/25/2013 1848   GFRNONAA 7 (L) 01/04/2021 0527   GFRNONAA 4 (L) 03/26/2014 1425   GFRAA 7 (L) 10/22/2019 0240   GFRAA 5 (L) 03/26/2014 1425     VAS [place] ABI WITH/WO TBI  Result Date: 05/04/2021  LOWER EXTREMITY DOPPLER STUDY Patient Name:  Lori [name]  Date of Exam:   05/01/2021 Medical Rec #: [id number]     Accession #:    [id number] Date of Birth: Jul 25, 1971     Patient Gender: F Patient Age:   88Y Exam Location:  [facility] Procedure:      VAS [place] ABI WITH/WO TBI Referring Phys: [id number]  [name] --------------------------------------------------------------------------------  Indications: Rest pain, and ulceration.  Vascular Interventions: 12/22/2020 PTA of Lt peroneal artery, SFA and popliteal.                         Mechanical                         thrombectomy of Lt SFA and prox popliteal artey. Stent                         Lt SFA and popliteal artery.                         12/30/2020 PTA of Rt ATA, SFA and popliteal artery. Stent                         Rt SFA and above knee popliteal and bilateral CIA. Comparison Study: 02/06/2021 Performing Technologist: Almira Coaster RVS  Examination Guidelines: A complete evaluation includes at minimum, Doppler waveform signals and systolic blood pressure reading at the level of bilateral brachial, anterior tibial, and posterior tibial arteries, when vessel segments are accessible. Bilateral testing is considered an integral part of a complete examination. Photoelectric Plethysmograph (PPG) waveforms and toe systolic pressure readings are included as required and additional duplex testing as needed. Limited examinations for reoccurring indications may be performed as noted.  ABI Findings: +-----+------------------+-----+--------+--------+ RightRt Pressure (mmHg)IndexWaveformComment  +-----+------------------+-----+--------+--------+ ATA  202               1.92         [place]       +-----+------------------+-----+--------+--------+ PTA  160  1.52         [place]       +-----+------------------+-----+--------+--------+ +--------+------------------+-----+--------+-------+ Left    Lt Pressure (mmHg)IndexWaveformComment +--------+------------------+-----+--------+-------+ Brachial105                                    +--------+------------------+-----+--------+-------+ ATA     154               1.47         Benjamin Perez      +--------+------------------+-----+--------+-------+  +-------+-----------+------------+------------+------------+ ABI/TBIToday's ABIToday's TBI Previous ABIPrevious TBI +-------+-----------+------------+------------+------------+ Right  >1.0 [place]    Not obtained1.27        .46          +-------+-----------+------------+------------+------------+ Left   >1.0 [place]    Not obtained1.23        .18          +-------+-----------+------------+------------+------------+  Summary: Right: Resting right ankle-brachial index indicates noncompressible right lower extremity arteries. TBIs not obtained, Exam was limited due to Patient's inability to Lie down or move leg to a assess Arterial flow. Imaged the Right PTA,ATA and Peroneal Artery Popliteal Artery was not obtained due to wrapping of the Knee area with gauze. Left: Resting left ankle-brachial index indicates noncompressible left lower extremity arteries. TBIs not obtained, Exam was limited due to Patient's inability to Lie down or move leg to a assess Arterial flow. Imaged the Popliteal Artery, Posterior Tibial Artery, Anterior Tibial Artery and Peroneal Artery.  *See table(s) above for measurements and observations.  Electronically signed by Hortencia Pilar MD on 05/04/2021 at 4:47:06 PM.    Final        Assessment & Plan:   1. PVD (peripheral vascular disease) ([place])  Recommend:  The patient has evidence of atherosclerosis of the lower extremities with claudication.  The patient does not voice lifestyle limiting changes at this point in time.  The patient studies were limited due to the patient's pain and difficulty with positioning.  Her studies today show that she should have some marginal wound healing abilities.  Plan to have the patient return in 3 months with noninvasive studies  If issues with wound healing arise.  2. Primary hypertension Continue antihypertensive medications as already ordered, these medications have been reviewed and there are no changes at this time.   3.  Hyperlipidemia, unspecified hyperlipidemia type Continue statin as ordered and reviewed, no changes at this time    Current Outpatient Medications on File Prior to Visit  Medication Sig Dispense Refill  . acetaminophen (TYLENOL) 325 MG tablet Take 650 mg by mouth every 6 (six) hours as needed.    Marland Kitchen albuterol (PROVENTIL HFA;VENTOLIN HFA) 108 (90 BASE) MCG/ACT inhaler Inhale 2 puffs into the lungs every 6 (six) hours as needed. Shortness of breath    . amiodarone (PACERONE) 200 MG tablet Take by mouth.    Marland Kitchen amoxicillin-clavulanate (AUGMENTIN) 875-125 MG tablet Take 1 tablet by mouth 2 (two) times daily. 28 tablet 0  . apixaban (ELIQUIS) 5 MG TABS tablet Take 1 tablet (5 mg total) by mouth 2 (two) times daily. 60 tablet 2  . ascorbic acid (VITAMIN C) 500 MG tablet Take 500 mg by mouth 2 (two) times daily.    Marland Kitchen aspirin EC 81 MG tablet Take 81 mg by mouth daily. Swallow whole.    Marland Kitchen atorvastatin (LIPITOR) 40 MG tablet Take 1 tablet (40 mg total) by mouth daily. 30 tablet 0  .  b complex-vitamin c-folic acid (NEPHRO-VITE) 0.8 MG TABS tablet Take 1 tablet by mouth daily.    . calcitRIOL (ROCALTROL) 0.25 MCG capsule Take 3 capsules (0.75 mcg total) by mouth every Monday, Wednesday, and Friday with hemodialysis.    . Calcium Acetate 667 MG TABS Take by mouth in the morning, at noon, in the evening, and at bedtime.    . cyclobenzaprine (FLEXERIL) 10 MG tablet Take 10 mg by mouth 3 (three) times daily.    Marland Kitchen EPINEPHrine 0.3 mg/0.3 mL IJ SOAJ injection Inject 0.3 mg into the muscle as needed.    . folic acid (FOLVITE) A999333 MCG tablet Take 400 mcg by mouth daily.    Marland Kitchen gabapentin (NEURONTIN) 100 MG capsule Take 1 capsule (100 mg total) by mouth at bedtime.    Marland Kitchen HYDROcodone-acetaminophen (NORCO/VICODIN) 5-325 MG tablet TAKE (1) TABLET BY MOUTH EVERY (6) HOURS AS NEEDED. 20 tablet 0  . levothyroxine (SYNTHROID, LEVOTHROID) 300 MCG tablet Take 300 mcg by mouth at bedtime. SYNTHROID ONLY-BRAND NAME MEDICALLY  NECESSARY    . metoprolol tartrate (LOPRESSOR) 25 MG tablet Take 1 tablet (25 mg total) by mouth 2 (two) times daily. 60 tablet 0  . Multiple Vitamin (MULTIVITAMIN ADULT PO) Take by mouth.    . nitroGLYCERIN (NITROSTAT) 0.4 MG SL tablet Place 1 tablet (0.4 mg total) under the tongue every 5 (five) minutes x 3 doses as needed for chest pain. 25 tablet 12  . Nutritional Supplements (PROMOD) LIQD Take 30 mLs by mouth in the morning and at bedtime.    . pantoprazole (PROTONIX) 40 MG tablet Take 1 tablet (40 mg total) by mouth at bedtime.    . polyethylene glycol (MIRALAX / GLYCOLAX) 17 g packet Take 17 g by mouth 2 (two) times daily. 14 each 0  . sevelamer carbonate (RENVELA) 800 MG tablet Take 1,600-3,200 mg by mouth 3 (three) times daily with meals. '3200mg'$  with meals and '1600mg'$  with snacks    . simethicone (MYLICON) 80 MG chewable tablet Chew 80 mg by mouth every 6 (six) hours as needed for flatulence.    . traMADol (ULTRAM) 50 MG tablet Take 1 tablet (50 mg total) by mouth every 6 (six) hours as needed (breakthrough for thigh pain). 30 tablet 0  . vitamin B-12 (CYANOCOBALAMIN) 500 MCG tablet Take 500 mcg by mouth daily.     No current facility-administered medications on file prior to visit.    There are no Patient Instructions on file for this visit. No follow-ups on file.   Kris Hartmann, NP

## 2021-05-11 DIAGNOSIS — E559 Vitamin D deficiency, unspecified: Secondary | ICD-10-CM | POA: Diagnosis not present

## 2021-05-11 DIAGNOSIS — Z992 Dependence on renal dialysis: Secondary | ICD-10-CM | POA: Diagnosis not present

## 2021-05-11 DIAGNOSIS — N186 End stage renal disease: Secondary | ICD-10-CM | POA: Diagnosis not present

## 2021-05-12 DIAGNOSIS — Z4800 Encounter for change or removal of nonsurgical wound dressing: Secondary | ICD-10-CM | POA: Diagnosis not present

## 2021-05-12 DIAGNOSIS — G8929 Other chronic pain: Secondary | ICD-10-CM | POA: Diagnosis not present

## 2021-05-12 DIAGNOSIS — I739 Peripheral vascular disease, unspecified: Secondary | ICD-10-CM | POA: Diagnosis not present

## 2021-05-12 DIAGNOSIS — Z8616 Personal history of COVID-19: Secondary | ICD-10-CM | POA: Diagnosis not present

## 2021-05-12 DIAGNOSIS — Z7982 Long term (current) use of aspirin: Secondary | ICD-10-CM | POA: Diagnosis not present

## 2021-05-12 DIAGNOSIS — Z8673 Personal history of transient ischemic attack (TIA), and cerebral infarction without residual deficits: Secondary | ICD-10-CM | POA: Diagnosis not present

## 2021-05-12 DIAGNOSIS — E785 Hyperlipidemia, unspecified: Secondary | ICD-10-CM | POA: Diagnosis not present

## 2021-05-12 DIAGNOSIS — S81801A Unspecified open wound, right lower leg, initial encounter: Secondary | ICD-10-CM | POA: Diagnosis not present

## 2021-05-12 DIAGNOSIS — E039 Hypothyroidism, unspecified: Secondary | ICD-10-CM | POA: Diagnosis not present

## 2021-05-12 DIAGNOSIS — N185 Chronic kidney disease, stage 5: Secondary | ICD-10-CM | POA: Diagnosis not present

## 2021-05-12 DIAGNOSIS — Z79899 Other long term (current) drug therapy: Secondary | ICD-10-CM | POA: Diagnosis not present

## 2021-05-12 DIAGNOSIS — L97912 Non-pressure chronic ulcer of unspecified part of right lower leg with fat layer exposed: Secondary | ICD-10-CM | POA: Diagnosis not present

## 2021-05-12 DIAGNOSIS — Z85850 Personal history of malignant neoplasm of thyroid: Secondary | ICD-10-CM | POA: Diagnosis not present

## 2021-05-12 DIAGNOSIS — I4891 Unspecified atrial fibrillation: Secondary | ICD-10-CM | POA: Diagnosis not present

## 2021-05-13 DIAGNOSIS — Z992 Dependence on renal dialysis: Secondary | ICD-10-CM | POA: Diagnosis not present

## 2021-05-13 DIAGNOSIS — N186 End stage renal disease: Secondary | ICD-10-CM | POA: Diagnosis not present

## 2021-05-16 DIAGNOSIS — N186 End stage renal disease: Secondary | ICD-10-CM | POA: Diagnosis not present

## 2021-05-16 DIAGNOSIS — Z992 Dependence on renal dialysis: Secondary | ICD-10-CM | POA: Diagnosis not present

## 2021-05-17 ENCOUNTER — Ambulatory Visit: Payer: Medicare Other | Admitting: Orthopaedic Surgery

## 2021-05-17 ENCOUNTER — Other Ambulatory Visit: Payer: Self-pay

## 2021-05-17 DIAGNOSIS — Z7401 Bed confinement status: Secondary | ICD-10-CM | POA: Diagnosis not present

## 2021-05-17 DIAGNOSIS — N186 End stage renal disease: Secondary | ICD-10-CM | POA: Diagnosis not present

## 2021-05-17 DIAGNOSIS — E039 Hypothyroidism, unspecified: Secondary | ICD-10-CM | POA: Diagnosis not present

## 2021-05-17 DIAGNOSIS — L97919 Non-pressure chronic ulcer of unspecified part of right lower leg with unspecified severity: Secondary | ICD-10-CM | POA: Diagnosis not present

## 2021-05-17 DIAGNOSIS — I70239 Atherosclerosis of native arteries of right leg with ulceration of unspecified site: Secondary | ICD-10-CM | POA: Diagnosis not present

## 2021-05-17 DIAGNOSIS — R279 Unspecified lack of coordination: Secondary | ICD-10-CM | POA: Diagnosis not present

## 2021-05-17 DIAGNOSIS — Z743 Need for continuous supervision: Secondary | ICD-10-CM | POA: Diagnosis not present

## 2021-05-17 DIAGNOSIS — I2510 Atherosclerotic heart disease of native coronary artery without angina pectoris: Secondary | ICD-10-CM | POA: Diagnosis not present

## 2021-05-17 DIAGNOSIS — Z992 Dependence on renal dialysis: Secondary | ICD-10-CM | POA: Diagnosis not present

## 2021-05-17 DIAGNOSIS — R52 Pain, unspecified: Secondary | ICD-10-CM | POA: Diagnosis not present

## 2021-05-17 DIAGNOSIS — L97818 Non-pressure chronic ulcer of other part of right lower leg with other specified severity: Secondary | ICD-10-CM | POA: Diagnosis not present

## 2021-05-18 DIAGNOSIS — N186 End stage renal disease: Secondary | ICD-10-CM | POA: Diagnosis not present

## 2021-05-18 DIAGNOSIS — Z992 Dependence on renal dialysis: Secondary | ICD-10-CM | POA: Diagnosis not present

## 2021-05-19 DIAGNOSIS — H524 Presbyopia: Secondary | ICD-10-CM | POA: Diagnosis not present

## 2021-05-20 DIAGNOSIS — N186 End stage renal disease: Secondary | ICD-10-CM | POA: Diagnosis not present

## 2021-05-20 DIAGNOSIS — Z992 Dependence on renal dialysis: Secondary | ICD-10-CM | POA: Diagnosis not present

## 2021-05-23 DIAGNOSIS — Z23 Encounter for immunization: Secondary | ICD-10-CM | POA: Diagnosis not present

## 2021-05-23 DIAGNOSIS — Z992 Dependence on renal dialysis: Secondary | ICD-10-CM | POA: Diagnosis not present

## 2021-05-23 DIAGNOSIS — N186 End stage renal disease: Secondary | ICD-10-CM | POA: Diagnosis not present

## 2021-05-25 DIAGNOSIS — Z992 Dependence on renal dialysis: Secondary | ICD-10-CM | POA: Diagnosis not present

## 2021-05-25 DIAGNOSIS — N186 End stage renal disease: Secondary | ICD-10-CM | POA: Diagnosis not present

## 2021-05-25 DIAGNOSIS — S81801D Unspecified open wound, right lower leg, subsequent encounter: Secondary | ICD-10-CM | POA: Diagnosis not present

## 2021-05-26 ENCOUNTER — Emergency Department (HOSPITAL_COMMUNITY): Payer: Medicare Other

## 2021-05-26 ENCOUNTER — Other Ambulatory Visit: Payer: Self-pay

## 2021-05-26 ENCOUNTER — Inpatient Hospital Stay (HOSPITAL_COMMUNITY)
Admission: EM | Admit: 2021-05-26 | Discharge: 2021-05-29 | DRG: 871 | Disposition: A | Discharge status: Skilled Nursing Facility | Payer: Medicare Other | Attending: Internal Medicine | Admitting: Internal Medicine

## 2021-05-26 DIAGNOSIS — L03115 Cellulitis of right lower limb: Secondary | ICD-10-CM | POA: Diagnosis present

## 2021-05-26 DIAGNOSIS — Z89422 Acquired absence of other left toe(s): Secondary | ICD-10-CM | POA: Diagnosis not present

## 2021-05-26 DIAGNOSIS — Z6841 Body Mass Index (BMI) 40.0 and over, adult: Secondary | ICD-10-CM

## 2021-05-26 DIAGNOSIS — R231 Pallor: Secondary | ICD-10-CM | POA: Diagnosis not present

## 2021-05-26 DIAGNOSIS — N2581 Secondary hyperparathyroidism of renal origin: Secondary | ICD-10-CM | POA: Diagnosis not present

## 2021-05-26 DIAGNOSIS — E890 Postprocedural hypothyroidism: Secondary | ICD-10-CM | POA: Diagnosis present

## 2021-05-26 DIAGNOSIS — M898X9 Other specified disorders of bone, unspecified site: Secondary | ICD-10-CM | POA: Diagnosis present

## 2021-05-26 DIAGNOSIS — I120 Hypertensive chronic kidney disease with stage 5 chronic kidney disease or end stage renal disease: Secondary | ICD-10-CM | POA: Diagnosis present

## 2021-05-26 DIAGNOSIS — D72829 Elevated white blood cell count, unspecified: Secondary | ICD-10-CM | POA: Diagnosis present

## 2021-05-26 DIAGNOSIS — S81801A Unspecified open wound, right lower leg, initial encounter: Secondary | ICD-10-CM | POA: Diagnosis present

## 2021-05-26 DIAGNOSIS — I959 Hypotension, unspecified: Secondary | ICD-10-CM | POA: Diagnosis not present

## 2021-05-26 DIAGNOSIS — I4819 Other persistent atrial fibrillation: Secondary | ICD-10-CM | POA: Diagnosis not present

## 2021-05-26 DIAGNOSIS — Z8616 Personal history of COVID-19: Secondary | ICD-10-CM

## 2021-05-26 DIAGNOSIS — N250 Renal osteodystrophy: Secondary | ICD-10-CM | POA: Diagnosis not present

## 2021-05-26 DIAGNOSIS — Z7982 Long term (current) use of aspirin: Secondary | ICD-10-CM | POA: Diagnosis not present

## 2021-05-26 DIAGNOSIS — I10 Essential (primary) hypertension: Secondary | ICD-10-CM | POA: Diagnosis present

## 2021-05-26 DIAGNOSIS — I252 Old myocardial infarction: Secondary | ICD-10-CM | POA: Diagnosis not present

## 2021-05-26 DIAGNOSIS — R52 Pain, unspecified: Secondary | ICD-10-CM

## 2021-05-26 DIAGNOSIS — A419 Sepsis, unspecified organism: Principal | ICD-10-CM | POA: Diagnosis present

## 2021-05-26 DIAGNOSIS — I4891 Unspecified atrial fibrillation: Secondary | ICD-10-CM | POA: Diagnosis not present

## 2021-05-26 DIAGNOSIS — K219 Gastro-esophageal reflux disease without esophagitis: Secondary | ICD-10-CM | POA: Diagnosis not present

## 2021-05-26 DIAGNOSIS — N186 End stage renal disease: Secondary | ICD-10-CM | POA: Diagnosis not present

## 2021-05-26 DIAGNOSIS — E039 Hypothyroidism, unspecified: Secondary | ICD-10-CM | POA: Diagnosis present

## 2021-05-26 DIAGNOSIS — Z91030 Bee allergy status: Secondary | ICD-10-CM | POA: Diagnosis not present

## 2021-05-26 DIAGNOSIS — Z8673 Personal history of transient ischemic attack (TIA), and cerebral infarction without residual deficits: Secondary | ICD-10-CM | POA: Diagnosis not present

## 2021-05-26 DIAGNOSIS — Z743 Need for continuous supervision: Secondary | ICD-10-CM | POA: Diagnosis not present

## 2021-05-26 DIAGNOSIS — R9431 Abnormal electrocardiogram [ECG] [EKG]: Secondary | ICD-10-CM | POA: Diagnosis not present

## 2021-05-26 DIAGNOSIS — E785 Hyperlipidemia, unspecified: Secondary | ICD-10-CM | POA: Diagnosis not present

## 2021-05-26 DIAGNOSIS — S81001A Unspecified open wound, right knee, initial encounter: Secondary | ICD-10-CM | POA: Diagnosis not present

## 2021-05-26 DIAGNOSIS — Z87891 Personal history of nicotine dependence: Secondary | ICD-10-CM

## 2021-05-26 DIAGNOSIS — D631 Anemia in chronic kidney disease: Secondary | ICD-10-CM | POA: Diagnosis present

## 2021-05-26 DIAGNOSIS — Z79899 Other long term (current) drug therapy: Secondary | ICD-10-CM | POA: Diagnosis not present

## 2021-05-26 DIAGNOSIS — G8929 Other chronic pain: Secondary | ICD-10-CM | POA: Diagnosis not present

## 2021-05-26 DIAGNOSIS — Z79890 Hormone replacement therapy (postmenopausal): Secondary | ICD-10-CM

## 2021-05-26 DIAGNOSIS — Z888 Allergy status to other drugs, medicaments and biological substances status: Secondary | ICD-10-CM | POA: Diagnosis not present

## 2021-05-26 DIAGNOSIS — Z85850 Personal history of malignant neoplasm of thyroid: Secondary | ICD-10-CM | POA: Diagnosis not present

## 2021-05-26 DIAGNOSIS — Z20822 Contact with and (suspected) exposure to covid-19: Secondary | ICD-10-CM | POA: Diagnosis present

## 2021-05-26 DIAGNOSIS — I739 Peripheral vascular disease, unspecified: Secondary | ICD-10-CM | POA: Diagnosis not present

## 2021-05-26 DIAGNOSIS — X58XXXA Exposure to other specified factors, initial encounter: Secondary | ICD-10-CM | POA: Diagnosis present

## 2021-05-26 DIAGNOSIS — N185 Chronic kidney disease, stage 5: Secondary | ICD-10-CM | POA: Diagnosis not present

## 2021-05-26 DIAGNOSIS — I2510 Atherosclerotic heart disease of native coronary artery without angina pectoris: Secondary | ICD-10-CM | POA: Diagnosis not present

## 2021-05-26 DIAGNOSIS — Z992 Dependence on renal dialysis: Secondary | ICD-10-CM

## 2021-05-26 DIAGNOSIS — Z955 Presence of coronary angioplasty implant and graft: Secondary | ICD-10-CM | POA: Diagnosis not present

## 2021-05-26 DIAGNOSIS — I249 Acute ischemic heart disease, unspecified: Secondary | ICD-10-CM | POA: Diagnosis present

## 2021-05-26 DIAGNOSIS — M1711 Unilateral primary osteoarthritis, right knee: Secondary | ICD-10-CM | POA: Diagnosis not present

## 2021-05-26 DIAGNOSIS — Z7901 Long term (current) use of anticoagulants: Secondary | ICD-10-CM

## 2021-05-26 DIAGNOSIS — Z8249 Family history of ischemic heart disease and other diseases of the circulatory system: Secondary | ICD-10-CM

## 2021-05-26 DIAGNOSIS — I517 Cardiomegaly: Secondary | ICD-10-CM | POA: Diagnosis not present

## 2021-05-26 DIAGNOSIS — L97912 Non-pressure chronic ulcer of unspecified part of right lower leg with fat layer exposed: Secondary | ICD-10-CM | POA: Diagnosis not present

## 2021-05-26 DIAGNOSIS — Z4800 Encounter for change or removal of nonsurgical wound dressing: Secondary | ICD-10-CM | POA: Diagnosis not present

## 2021-05-26 LAB — PROCALCITONIN: Procalcitonin: 5.79 ng/mL

## 2021-05-26 LAB — COMPREHENSIVE METABOLIC PANEL
ALT: 15 U/L (ref 0–44)
AST: 21 U/L (ref 15–41)
Albumin: 1.8 g/dL — ABNORMAL LOW (ref 3.5–5.0)
Alkaline Phosphatase: 259 U/L — ABNORMAL HIGH (ref 38–126)
Anion gap: 21 — ABNORMAL HIGH (ref 5–15)
BUN: 10 mg/dL (ref 6–20)
CO2: 21 mmol/L — ABNORMAL LOW (ref 22–32)
Calcium: 9 mg/dL (ref 8.9–10.3)
Chloride: 95 mmol/L — ABNORMAL LOW (ref 98–111)
Creatinine, Ser: 2.88 mg/dL — ABNORMAL HIGH (ref 0.44–1.00)
GFR, Estimated: 19 mL/min — ABNORMAL LOW (ref >60)
Glucose, Bld: 86 mg/dL (ref 70–99)
Potassium: 3.2 mmol/L — ABNORMAL LOW (ref 3.5–5.1)
Sodium: 137 mmol/L (ref 135–145)
Total Bilirubin: 1.3 mg/dL — ABNORMAL HIGH (ref 0.3–1.2)
Total Protein: 5 g/dL — ABNORMAL LOW (ref 6.5–8.1)

## 2021-05-26 LAB — CBC WITH DIFFERENTIAL/PLATELET
Abs Immature Granulocytes: 0.26 10*3/uL — ABNORMAL HIGH (ref 0.00–0.07)
Basophils Absolute: 0.1 10*3/uL (ref 0.0–0.1)
Basophils Relative: 1 %
Eosinophils Absolute: 0.1 10*3/uL (ref 0.0–0.5)
Eosinophils Relative: 1 %
HCT: 31.2 % — ABNORMAL LOW (ref 36.0–46.0)
Hemoglobin: 9.7 g/dL — ABNORMAL LOW (ref 12.0–15.0)
Immature Granulocytes: 1 %
Lymphocytes Relative: 5 %
Lymphs Abs: 1 10*3/uL (ref 0.7–4.0)
MCH: 26.8 pg (ref 26.0–34.0)
MCHC: 31.1 g/dL (ref 30.0–36.0)
MCV: 86.2 fL (ref 80.0–100.0)
Monocytes Absolute: 1.8 10*3/uL — ABNORMAL HIGH (ref 0.1–1.0)
Monocytes Relative: 9 %
Neutro Abs: 17.7 10*3/uL — ABNORMAL HIGH (ref 1.7–7.7)
Neutrophils Relative %: 83 %
Platelets: 394 10*3/uL (ref 150–400)
RBC: 3.62 MIL/uL — ABNORMAL LOW (ref 3.87–5.11)
RDW: 21.4 % — ABNORMAL HIGH (ref 11.5–15.5)
WBC: 21 10*3/uL — ABNORMAL HIGH (ref 4.0–10.5)
nRBC: 0 % (ref 0.0–0.2)

## 2021-05-26 LAB — RESP PANEL BY RT-PCR (FLU A&B, COVID) ARPGX2
Influenza A by PCR: NEGATIVE
Influenza B by PCR: NEGATIVE
SARS Coronavirus 2 by RT PCR: NEGATIVE

## 2021-05-26 LAB — LACTIC ACID, PLASMA
Lactic Acid, Venous: 1.5 mmol/L (ref 0.5–1.9)
Lactic Acid, Venous: 1.8 mmol/L (ref 0.5–1.9)

## 2021-05-26 MED ORDER — ONDANSETRON HCL 4 MG/2ML IJ SOLN
4.0000 mg | Freq: Four times a day (QID) | INTRAMUSCULAR | Status: DC | PRN
  Administered 2021-05-26: 4 mg via INTRAVENOUS
  Filled 2021-05-26: qty 2

## 2021-05-26 MED ORDER — ATORVASTATIN CALCIUM 40 MG PO TABS
40.0000 mg | ORAL_TABLET | Freq: Every evening | ORAL | Status: DC
  Administered 2021-05-27 – 2021-05-28 (×2): 40 mg via ORAL
  Filled 2021-05-26 (×2): qty 1

## 2021-05-26 MED ORDER — FENTANYL CITRATE (PF) 100 MCG/2ML IJ SOLN
100.0000 ug | Freq: Once | INTRAMUSCULAR | Status: AC
Start: 2021-05-26 — End: 2021-05-26
  Administered 2021-05-26: 100 ug via INTRAVENOUS
  Filled 2021-05-26: qty 2

## 2021-05-26 MED ORDER — LEVOTHYROXINE SODIUM 100 MCG PO TABS
300.0000 ug | ORAL_TABLET | Freq: Every morning | ORAL | Status: DC
  Administered 2021-05-27 – 2021-05-29 (×3): 300 ug via ORAL
  Filled 2021-05-26 (×3): qty 3

## 2021-05-26 MED ORDER — SODIUM CHLORIDE 0.9 % IV SOLN
2.0000 g | INTRAVENOUS | Status: DC
  Administered 2021-05-27: 2 g via INTRAVENOUS
  Filled 2021-05-26: qty 2

## 2021-05-26 MED ORDER — CHLORHEXIDINE GLUCONATE CLOTH 2 % EX PADS
6.0000 | MEDICATED_PAD | Freq: Every day | CUTANEOUS | Status: DC
  Administered 2021-05-27: 6 via TOPICAL

## 2021-05-26 MED ORDER — LACTATED RINGERS IV BOLUS (SEPSIS): 500.0000 mL | Freq: Once | INTRAVENOUS | Status: DC

## 2021-05-26 MED ORDER — SEVELAMER CARBONATE 800 MG PO TABS
3200.0000 mg | ORAL_TABLET | Freq: Three times a day (TID) | ORAL | Status: DC
  Filled 2021-05-26 (×3): qty 4

## 2021-05-26 MED ORDER — ASPIRIN EC 81 MG PO TBEC
81.0000 mg | DELAYED_RELEASE_TABLET | Freq: Every day | ORAL | Status: DC
  Administered 2021-05-27 – 2021-05-29 (×3): 81 mg via ORAL
  Filled 2021-05-26 (×3): qty 1

## 2021-05-26 MED ORDER — CYCLOBENZAPRINE HCL 10 MG PO TABS
10.0000 mg | ORAL_TABLET | Freq: Three times a day (TID) | ORAL | Status: DC
  Administered 2021-05-26 – 2021-05-29 (×8): 10 mg via ORAL
  Filled 2021-05-26 (×8): qty 1

## 2021-05-26 MED ORDER — HYDROCODONE-ACETAMINOPHEN 5-325 MG PO TABS
1.0000 | ORAL_TABLET | ORAL | Status: DC | PRN
  Administered 2021-05-26 – 2021-05-29 (×12): 1 via ORAL
  Filled 2021-05-26 (×14): qty 1

## 2021-05-26 MED ORDER — PANTOPRAZOLE SODIUM 40 MG PO TBEC
40.0000 mg | DELAYED_RELEASE_TABLET | Freq: Every day | ORAL | Status: DC
  Administered 2021-05-26 – 2021-05-28 (×3): 40 mg via ORAL
  Filled 2021-05-26 (×3): qty 1

## 2021-05-26 MED ORDER — SILVER SULFADIAZINE 1 % EX CREA
TOPICAL_CREAM | CUTANEOUS | Status: DC
  Filled 2021-05-26: qty 85

## 2021-05-26 MED ORDER — METOPROLOL TARTRATE 25 MG PO TABS
25.0000 mg | ORAL_TABLET | Freq: Two times a day (BID) | ORAL | Status: DC
  Administered 2021-05-26 – 2021-05-29 (×5): 25 mg via ORAL
  Filled 2021-05-26 (×6): qty 1

## 2021-05-26 MED ORDER — ONDANSETRON HCL 4 MG PO TABS: 4.0000 mg | ORAL_TABLET | Freq: Four times a day (QID) | ORAL | Status: DC | PRN

## 2021-05-26 MED ORDER — CALCITRIOL 0.25 MCG PO CAPS: 0.7500 ug | ORAL_CAPSULE | ORAL | Status: DC

## 2021-05-26 MED ORDER — HYDROMORPHONE HCL 1 MG/ML IJ SOLN
1.0000 mg | Freq: Once | INTRAMUSCULAR | Status: AC
  Administered 2021-05-26: 1 mg via INTRAVENOUS
  Filled 2021-05-26: qty 1

## 2021-05-26 MED ORDER — VANCOMYCIN HCL 1500 MG/300ML IV SOLN: 1500.0000 mg | INTRAVENOUS | Status: DC

## 2021-05-26 MED ORDER — VANCOMYCIN HCL 2000 MG/400ML IV SOLN
2000.0000 mg | Freq: Once | INTRAVENOUS | Status: AC
  Administered 2021-05-26: 2000 mg via INTRAVENOUS
  Filled 2021-05-26: qty 400

## 2021-05-26 MED ORDER — GABAPENTIN 100 MG PO CAPS
100.0000 mg | ORAL_CAPSULE | Freq: Every day | ORAL | Status: DC
  Administered 2021-05-26 – 2021-05-28 (×3): 100 mg via ORAL
  Filled 2021-05-26 (×3): qty 1

## 2021-05-26 MED ORDER — LACTATED RINGERS IV SOLN: INTRAVENOUS | Status: AC

## 2021-05-26 MED ORDER — APIXABAN 5 MG PO TABS
5.0000 mg | ORAL_TABLET | Freq: Two times a day (BID) | ORAL | Status: DC
  Administered 2021-05-26 – 2021-05-29 (×6): 5 mg via ORAL
  Filled 2021-05-26 (×6): qty 1

## 2021-05-26 MED ORDER — CALCIUM ACETATE (PHOS BINDER) 667 MG PO CAPS
667.0000 mg | ORAL_CAPSULE | Freq: Three times a day (TID) | ORAL | Status: DC
  Filled 2021-05-26 (×5): qty 1

## 2021-05-26 MED ORDER — HYDROMORPHONE HCL 1 MG/ML IJ SOLN
1.0000 mg | Freq: Every day | INTRAMUSCULAR | Status: DC | PRN
  Administered 2021-05-27 – 2021-05-28 (×2): 1 mg via INTRAVENOUS
  Filled 2021-05-26 (×2): qty 1

## 2021-05-26 MED ORDER — POLYETHYLENE GLYCOL 3350 17 G PO PACK
17.0000 g | PACK | Freq: Two times a day (BID) | ORAL | Status: DC
  Administered 2021-05-26 – 2021-05-27 (×2): 17 g via ORAL
  Filled 2021-05-26 (×5): qty 1

## 2021-05-26 MED ORDER — AMIODARONE HCL 200 MG PO TABS
200.0000 mg | ORAL_TABLET | Freq: Every day | ORAL | Status: DC
  Administered 2021-05-27 – 2021-05-29 (×3): 200 mg via ORAL
  Filled 2021-05-26 (×3): qty 1

## 2021-05-26 MED ORDER — VANCOMYCIN HCL IN DEXTROSE 1-5 GM/200ML-% IV SOLN: 1000.0000 mg | Freq: Once | INTRAVENOUS | Status: DC

## 2021-05-26 MED ORDER — METOPROLOL TARTRATE 5 MG/5ML IV SOLN
5.0000 mg | Freq: Once | INTRAVENOUS | Status: AC
  Administered 2021-05-26: 5 mg via INTRAVENOUS
  Filled 2021-05-26: qty 5

## 2021-05-26 MED ORDER — METRONIDAZOLE 500 MG/100ML IV SOLN
500.0000 mg | Freq: Once | INTRAVENOUS | Status: AC
  Administered 2021-05-26: 500 mg via INTRAVENOUS
  Filled 2021-05-26: qty 100

## 2021-05-26 MED ORDER — SODIUM CHLORIDE 0.9 % IV SOLN
2.0000 g | Freq: Once | INTRAVENOUS | Status: AC
  Administered 2021-05-26: 2 g via INTRAVENOUS
  Filled 2021-05-26: qty 2

## 2021-05-26 NOTE — ED Notes (Signed)
Fluid resuscitation was delayed due to only having 1 IV and antibiotics were being given.

## 2021-05-26 NOTE — Progress Notes (Signed)
Contacted Elink concerning AFIB RVR with rate 160-170's sustaining . Patient has vomited X1. She denies dizziness, normotensive

## 2021-05-26 NOTE — ED Triage Notes (Signed)
Pt went from [facility] to [facility] day surgery for evaluation of right leg wound. Pt had leg wound debrided last Wednesday and was having it checked today. Pt is here today for increased pain in her right leg.

## 2021-05-26 NOTE — Progress Notes (Signed)
Elink monitoring for sepsis protocol 

## 2021-05-26 NOTE — H&P (Signed)
History and Physical  Lori [name] [id number] DOB: 10-10-1971 DOA: 05/26/2021  Referring physician: Dr Rogene Houston, ED physician PCP: Patient, No Pcp Per (Inactive)  Outpatient Specialists:   Patient Coming From: [facility]  Chief Complaint: increased leg pain, tachycardia  HPI: Lori [name] is a 50 y.o. female with a history of end-stage renal disease on dialysis, hypertension, Hashimoto's thyroiditis status post thyroidectomy and parathyroidectomy, peripheral artery disease with history of gangrene of several digits of her feet, chronic atrial fibrillation on anticoagulation.  She is currently being followed by wound care at [facility] due to large right posterior thigh and upper lower leg wounds.  The wounds have been improving.  Patient was at the wound center today when they noted that her heart rate was in the 160s.  She was sent to our hospital for evaluation.  Patient feels fine and simply has increasing leg pain.  Denies fevers, chills, nausea, vomiting.  No palliating or provoking factors to the patient's heart rate.  Her condition remained the same between the outside hospital and her arrival here.  Emergency Department Course: Heart rate in the 160s on arrival.  She was given 5 mg of IV metoprolol with improvement of her heart rate to the 100-110s.  White count 21.  Temperature 97.5.  Blood pressures have been [id number] systolically.  Map has been normal.  Lactic acid normal.  COVID-negative.  Blood cultures obtained and broad-spectrum antibiotics started.  Review of Systems:   Pt complains of leg pain.  Pt denies any fevers, chills, nausea, vomiting, diarrhea, constipation, abdominal pain, shortness of breath, dyspnea on exertion, orthopnea, cough, wheezing, palpitations, headache, vision changes, lightheadedness, dizziness, melena, rectal bleeding.  Review of systems are otherwise negative  Past Medical History:  Diagnosis Date   Anemia    ASCVD (arteriosclerotic  cardiovascular disease)    a. s/p prior stenting of LAD b. NSTEMI in 01/2018 requiring DES x3 to RCA 2/2 spiral dissection from ost->mid RCA but residual dzs in dRCA, LAD & OM2; c. 09/2019 Cath/PCI: LM nl, LAD 50 ISR, 65 p/m, 16m(2.5x15 Resolute Onyx DES), 90d, LCX 99d, OM2 99 (2.25x26 Resolute Onyx DES), RCA 40p/219mSR, 85d, RPDA 100ost, RPAV 50. EF 50-55%.   Calciphylaxis    Chronic abdominal wound infection    COVID-19 virus infection 11/2020   [facility]   Diastolic dysfunction    a. 11/2020 Echo: EF 55-60%, mod conc LVh, gr2 DD, nl RV size/fxn. Sev dil LA. Triv MR. Mod-Sev mitral annular Ca2+. Mild-mod Ao sclerosis w/o stenosis.   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 peitonitis; difficult vascular access-->HD MWF.   [place] ([facility] [place]   a. 11/2020 L forefoot dry gangrene involving 4th and 5th MTP joints & digits, & R 5th MTP joint.   GERD (gastroesophageal reflux disease)    Hashimoto thyroiditis    Hyperparathyroidism    Hypertension    Hypothyroidism    Medically noncompliant    Morbid obesity ([facility]   [place]ipheral artery disease) ([facility]   a. 11/2020 PTA of L peroneal, PTA/thrombectomy, Viabahn stenting x 2 to the L SFA and popliteal arteries (6x25067m 6x150m54m  Persistent atrial fibrillation (HCC)[place] a. Noted during hospitalization 11/2020.   Past Surgical History:  Procedure Laterality Date   AMPUTATION Left 01/01/2021   Procedure: AMPUTATION RAY-4th & 5th;  Surgeon: FowlSamara DeistM;  Location: [facility] ORS;  Service: Podiatry;  Laterality: Left;   BTL  1992   CORONARY STENT INTERVENTION N/A 10/21/2019   Procedure: CORONARY STENT INTERVENTION;  Surgeon: Wellington Hampshire, MD;  Location: [place] CV LAB;  Service: Cardiovascular;  Laterality: N/A;   LEFT HEART CATH AND CORONARY ANGIOGRAPHY N/A 02/11/2018   Procedure: LEFT HEART CATH AND CORONARY ANGIOGRAPHY;  Surgeon: Charolette Forward,  MD;  Location: [place] [facility];  Service: Cardiovascular;  Laterality: N/A;   LEFT HEART CATH AND CORONARY ANGIOGRAPHY N/A 10/14/2019   Procedure: LEFT HEART CATH AND CORONARY ANGIOGRAPHY;  Surgeon: Wellington Hampshire, MD;  Location: [place] [facility] LAB;  Service: Cardiovascular;  Laterality: N/A;   LOWER EXTREMITY ANGIOGRAPHY Left 12/22/2020   Procedure: Lower Extremity Angiography;  Surgeon: Algernon Huxley, MD;  Location: [place] CV LAB;  Service: Cardiovascular;  Laterality: Left;   LOWER EXTREMITY ANGIOGRAPHY Right 12/30/2020   Procedure: Lower Extremity Angiography;  Surgeon: Algernon Huxley, MD;  Location: [place] [facility] LAB;  Service: Cardiovascular;  Laterality: Right;   THYROIDECTOMY, PARTIAL     Resectin of left lobe with reimplantation in the forearm, small focus of papillary carcinoma incidentally noted at pathology - 2000 and Hashimoto's thyrdoiditis in 2001   TONSILLECTOMY AND ADENOIDECTOMY     Social History:  reports that she has quit smoking. She has never used smokeless tobacco. She reports that she does not drink alcohol and does not use drugs. Patient lives at home  Allergies  Allergen Reactions   Alteplase Shortness Of Breath and Anaphylaxis   Bee Pollen Anaphylaxis   Bee Venom Anaphylaxis   Warfarin Anaphylaxis   Warfarin Sodium Nausea And Vomiting and Rash    Family History  Problem Relation Age of Onset   Coronary artery disease Mother    Kidney disease Father    Diabetes Sister       Prior to Admission medications   Medication Sig Start Date End Date Taking? Authorizing Provider  acetaminophen (TYLENOL) 325 MG tablet Take 650 mg by mouth every 6 (six) hours as needed.    [provider]  albuterol (PROVENTIL HFA;VENTOLIN HFA) 108 (90 BASE) MCG/ACT inhaler Inhale 2 puffs into the lungs every 6 (six) hours as needed. Shortness of breath    [provider]  amiodarone (PACERONE) 200 MG tablet Take by mouth. 01/26/21 01/26/22  [provider]  amoxicillin-clavulanate (AUGMENTIN) 875-125 MG tablet Take 1 tablet by mouth 2 (two) times daily. 02/06/21   Kris Hartmann, NP  apixaban (ELIQUIS) 5 MG TABS tablet Take 1 tablet (5 mg total) by mouth 2 (two) times daily. 01/09/21   Fritzi Mandes, MD  ascorbic acid (VITAMIN C) 500 MG tablet Take 500 mg by mouth 2 (two) times daily.    [provider]  aspirin EC 81 MG tablet Take 81 mg by mouth daily. Swallow whole.    [provider]  atorvastatin (LIPITOR) 40 MG tablet Take 1 tablet (40 mg total) by mouth daily. 01/09/21   Fritzi Mandes, MD  b complex-vitamin c-folic acid (NEPHRO-VITE) 0.8 MG TABS tablet Take 1 tablet by mouth daily.    [provider]  calcitRIOL (ROCALTROL) 0.25 MCG capsule Take 3 capsules (0.75 mcg total) by mouth every Monday, Wednesday, and Friday with hemodialysis. 12/19/20   Johnson, Clanford L, MD  Calcium Acetate 667 MG TABS Take by mouth in the morning, at noon, in the evening, and at bedtime.    [provider]  cyclobenzaprine (FLEXERIL) 10 MG tablet Take  10 mg by mouth 3 (three) times daily.    [provider]  EPINEPHrine 0.3 mg/0.3 mL IJ SOAJ injection Inject 0.3 mg into the muscle as needed.    [provider]  folic acid (FOLVITE) A999333 MCG tablet Take 400 mcg by mouth daily.    [provider]  gabapentin (NEURONTIN) 100 MG capsule Take 1 capsule (100 mg total) by mouth at bedtime. 12/20/20   Johnson, Clanford L, MD  HYDROcodone-acetaminophen (NORCO/VICODIN) 5-325 MG tablet TAKE (1) TABLET BY MOUTH EVERY (6) HOURS AS NEEDED. 01/09/21   Fritzi Mandes, MD  levothyroxine (SYNTHROID, LEVOTHROID) 300 MCG tablet Take 300 mcg by mouth at bedtime. SYNTHROID ONLY-BRAND NAME MEDICALLY NECESSARY    [provider]  metoprolol tartrate (LOPRESSOR) 25 MG tablet Take 1 tablet (25 mg total) by mouth 2 (two) times daily. 01/09/21   Fritzi Mandes, MD  Multiple Vitamin (MULTIVITAMIN ADULT PO) Take by mouth.     [provider]  nitroGLYCERIN (NITROSTAT) 0.4 MG SL tablet Place 1 tablet (0.4 mg total) under the tongue every 5 (five) minutes x 3 doses as needed for chest pain. 02/14/18   Charolette Forward, MD  Nutritional Supplements (PROMOD) LIQD Take 30 mLs by mouth in the morning and at bedtime.    [provider]  pantoprazole (PROTONIX) 40 MG tablet Take 1 tablet (40 mg total) by mouth at bedtime. 12/16/20   Johnson, Clanford L, MD  polyethylene glycol (MIRALAX / GLYCOLAX) 17 g packet Take 17 g by mouth 2 (two) times daily. 12/20/20   Johnson, Clanford L, MD  sevelamer carbonate (RENVELA) 800 MG tablet Take 1,600-3,200 mg by mouth 3 (three) times daily with meals. '3200mg'$  with meals and '1600mg'$  with snacks    [provider]  simethicone (MYLICON) 80 MG chewable tablet Chew 80 mg by mouth every 6 (six) hours as needed for flatulence.    [provider]  traMADol (ULTRAM) 50 MG tablet Take 1 tablet (50 mg total) by mouth every 6 (six) hours as needed (breakthrough for thigh pain). 02/06/21   Kris Hartmann, NP  vitamin B-12 (CYANOCOBALAMIN) 500 MCG tablet Take 500 mcg by mouth daily.    [provider]    Physical Exam: BP (!) 79/52   Pulse (!) 102   Temp (!) 97.5 F (36.4 C) (Oral)   Resp 12   Ht '5\' 3"'$  (1.6 m)   Wt 116 kg   SpO2 96%   BMI 45.30 kg/m   General: middle age female. Awake and alert and oriented x3. No acute cardiopulmonary distress.  HEENT: Normocephalic atraumatic.  Right and left ears normal in appearance.  Pupils equal, round, reactive to light. Extraocular muscles are intact. Sclerae anicteric and noninjected.  Moist mucosal membranes. No mucosal lesions.  Neck: Neck supple without lymphadenopathy. No carotid bruits. No masses palpated.  Cardiovascular: tachycardic with irregularly irregular rate. No murmurs, rubs, gallops auscultated. No JVD.  Respiratory: Good respiratory effort with no wheezes, rales, rhonchi. Lungs clear to auscultation  bilaterally.  No accessory muscle use. Abdomen: Soft, nontender, nondistended. Active bowel sounds. No masses or hepatosplenomegaly  Skin: There is a large packed wound in the medial right posterior thigh and upper lower leg.  The edges of the wound appear normal with good granulation tissue.  Dry, warm to touch. 2+ dorsalis pedis and radial pulses. Musculoskeletal: No calf or leg pain. All major joints not erythematous nontender.  No upper or lower joint deformation.  Good ROM.  No contractures  Psychiatric:  Intact judgment and insight. Pleasant and cooperative. Neurologic: No focal neurological deficits. Strength is 5/5 and symmetric in upper and lower extremities.  Cranial nerves II through XII are grossly intact.           Labs on Admission: I have personally reviewed following labs and imaging studies  CBC: Recent Labs  Lab 05/26/21 1123  WBC 21.0*  NEUTROABS 17.7*  HGB 9.7*  HCT 31.2*  MCV 86.2  PLT [id number]   Basic Metabolic Panel: Recent Labs  Lab 05/26/21 1123  NA 137  K 3.2*  CL 95*  CO2 21*  GLUCOSE 86  BUN 10  CREATININE 2.88*  CALCIUM 9.0   GFR: Estimated Creatinine Clearance: 28.4 mL/min (A) (by C-G formula based on SCr of 2.88 mg/dL (H)). Liver Function Tests: Recent Labs  Lab 05/26/21 1123  AST 21  ALT 15  ALKPHOS 259*  BILITOT 1.3*  PROT 5.0*  ALBUMIN 1.8*   No results for input(s): LIPASE, AMYLASE in the last 168 hours. No results for input(s): AMMONIA in the last 168 hours. Coagulation Profile: No results for input(s): INR, PROTIME in the last 168 hours. Cardiac Enzymes: No results for input(s): CKTOTAL, CKMB, CKMBINDEX, TROPONINI in the last 168 hours. BNP (last 3 results) No results for input(s): PROBNP in the last 8760 hours. HbA1C: No results for input(s): HGBA1C in the last 72 hours. CBG: No results for input(s): GLUCAP in the last 168 hours. Lipid Profile: No results for input(s): CHOL, HDL, LDLCALC, TRIG, CHOLHDL, LDLDIRECT in the  last 72 hours. Thyroid Function Tests: No results for input(s): TSH, T4TOTAL, FREET4, T3FREE, THYROIDAB in the last 72 hours. Anemia Panel: No results for input(s): VITAMINB12, FOLATE, FERRITIN, TIBC, IRON, RETICCTPCT in the last 72 hours. Urine analysis: No results found for: COLORURINE, APPEARANCEUR, LABSPEC, PHURINE, GLUCOSEU, HGBUR, BILIRUBINUR, KETONESUR, PROTEINUR, UROBILINOGEN, NITRITE, LEUKOCYTESUR Sepsis Labs: '@LABRCNTIP'$ (procalcitonin:4,lacticidven:4) ) Recent Results (from the past 240 hour(s))  Resp Panel by RT-PCR (Flu A&B, Covid) Nasopharyngeal Swab     Status: None   Collection Time: 05/26/21 12:20 PM   Specimen: Nasopharyngeal Swab; Nasopharyngeal(NP) swabs in vial transport medium  Result Value Ref Range Status   SARS Coronavirus 2 by RT PCR NEGATIVE NEGATIVE Final    Comment: (NOTE) SARS-CoV-2 target nucleic acids are NOT DETECTED.  The SARS-CoV-2 RNA is generally detectable in upper respiratory specimens during the acute phase of infection. The lowest concentration of SARS-CoV-2 viral copies this assay can detect is 138 copies/mL. A negative result does not preclude SARS-Cov-2 infection and should not be used as the sole basis for treatment or other patient management decisions. A negative result may occur with  improper specimen collection/handling, submission of specimen other than nasopharyngeal swab, presence of viral mutation(s) within the areas targeted by this assay, and inadequate number of viral copies(<138 copies/mL). A negative result must be combined with clinical observations, patient history, and epidemiological information. The expected result is Negative.  Fact Sheet for Patients:  EntrepreneurPulse.com.au  Fact Sheet for Healthcare Providers:  IncredibleEmployment.be  This test is no t yet approved or cleared by the [place] FDA and  has been authorized for detection and/or diagnosis of SARS-CoV-2  by FDA under an Emergency Use Authorization (EUA). This EUA will remain  in effect (meaning this test can be used) for the duration of the COVID-19 declaration under Section 564(b)(1) of the Act, 21 U.S.C.section 360bbb-3(b)(1), unless the authorization is terminated  or revoked sooner.       Influenza A by PCR NEGATIVE NEGATIVE  Final   Influenza B by PCR NEGATIVE NEGATIVE Final    Comment: (NOTE) The Xpert Xpress SARS-CoV-2/FLU/RSV plus assay is intended as an aid in the diagnosis of influenza from Nasopharyngeal swab specimens and should not be used as a sole basis for treatment. Nasal washings and aspirates are unacceptable for Xpert Xpress SARS-CoV-2/FLU/RSV testing.  Fact Sheet for Patients: EntrepreneurPulse.com.au  Fact Sheet for Healthcare Providers: IncredibleEmployment.be  This test is not yet approved or cleared by the [place] FDA and has been authorized for detection and/or diagnosis of SARS-CoV-2 by FDA under an Emergency Use Authorization (EUA). This EUA will remain in effect (meaning this test can be used) for the duration of the COVID-19 declaration under Section 564(b)(1) of the Act, 21 U.S.C. section 360bbb-3(b)(1), unless the authorization is terminated or revoked.  Performed at [facility], [address], [place], [place] [address]   Culture, blood (Routine X 2) w Reflex to ID Panel     Status: None (Preliminary result)   Collection Time: 05/26/21 12:49 PM   Specimen: Left Antecubital; Blood  Result Value Ref Range Status   Specimen Description   Final    LEFT ANTECUBITAL BOTTLES DRAWN AEROBIC AND ANAEROBIC   Special Requests   Final    Blood Culture adequate volume Performed at [facility], [address] [place], [place] [address]    Culture PENDING  Incomplete   Report Status PENDING  Incomplete  Culture, blood (Routine X 2) w Reflex to ID Panel     Status: None (Preliminary result)   Collection Time:  05/26/21 12:50 PM   Specimen: Left Antecubital; Blood  Result Value Ref Range Status   Specimen Description BLOOD LEFT WRIST BOTTLES DRAWN AEROBIC ONLY  Final   Special Requests   Final    Blood Culture adequate volume Performed at [facility], [address] [place], [place] [address]    Culture PENDING  Incomplete   Report Status PENDING  Incomplete     Radiological Exams on Admission: DG Knee 1-2 Views Right  Result Date: 05/26/2021 CLINICAL DATA:  Increasing knee pain following wound debridement, initial encounter EXAM: RIGHT KNEE - 1-2 VIEW COMPARISON:  03/07/2019 FINDINGS: No acute fracture or dislocation is noted. Degenerative changes are noted worst in the lateral joint space. Prior femoropopliteal artery stenting is noted. IMPRESSION: Degenerative change without acute abnormality. Electronically Signed   By: Inez Catalina M.D.   On: 05/26/2021 13:25   DG Chest Port 1 View  Result Date: 05/26/2021 CLINICAL DATA:  Sepsis. EXAM: PORTABLE CHEST 1 VIEW COMPARISON:  Chest x-ray dated December 14, 2020. FINDINGS: Unchanged tunneled right internal jugular dialysis catheter. Stable cardiomegaly. Normal pulmonary vascularity. No focal consolidation, pleural effusion, or pneumothorax. No acute osseous abnormality. Prior thyroidectomy. Proximal right upper extremity vascular stents again noted. IMPRESSION: 1. No active disease. Electronically Signed   By: Titus Dubin M.D.   On: 05/26/2021 12:49    EKG: Independently reviewed.  Tachycardia with left bundle branch block no acute ST changes.  Assessment/Plan: Principal Problem:   Atrial fibrillation with RVR (HCC) Active Problems:   Cardiovascular disease   ESRD on dialysis ([place])   Hypothyroidism   BMI 40.0-44.9, adult (HCC)   HTN (hypertension)   Atrial fibrillation (HCC)   Leukocytosis    This patient was discussed with the ED physician, including pertinent vitals, physical exam findings, labs, and imaging.  We also discussed care  given by the ED provider.  A. fib with RVR with history of coronary artery  disease Admit to stepdown Telemetry monitoring Watch heart rate closely Continue with amiodarone and metoprolol Leukocytosis Question etiology Continue broad-spectrum antibiotics Check procalcitonin now and tomorrow morning AM cortisol Blood cultures obtained in the emergency department Hypothyroidism Check TSH in the morning Continue levothyroxine Hypertension Continue antihypertensives End-stage renal disease on dialysis Consult nephrology Large skin wound Wet-to-dry dressing changes  DVT prophylaxis: On chronic anticoagulation, which we will continue Consultants: Nephrology Code Status: Full code Family Communication: None Disposition Plan: Patient will return back to [facility] following hospitalization   Truett Mainland, DO

## 2021-05-26 NOTE — ED Provider Notes (Signed)
[facility] EMERGENCY DEPARTMENT Provider Note   CSN: [id number] Arrival date & time: 05/26/21  1040     History Chief Complaint  Patient presents with   Leg Pain    Lori [name] is a 50 y.o. female.  Patient arrived by EMS from [facility] wound care clinic.  Patient had a routine appointment there.  Patient is a resident at the [facility].  Patient's been receiving dialysis for 25 years.  Known to have coronary artery disease and peripheral vascular disease.  Patient was dialyzed yesterday.  Normally dialyzed Tuesday Thursdays and Saturdays.  Patient has a dialysis catheter in her right anterior chest area.  Patient in the past with gangrene of her left foot has had toes amputated.  Patient has just diastolic dysfunction.  She had COVID infection in January of this year.  Patient has chronic wounds to her right lower extremity and behind her knee and to the upper calf area.  This is when she goes to the wound care clinic for.  They felt in their note that the wounds were baseline.  She goes once a week they do wet-to-dry dressing changes.  Patient states that she has had coronary artery stents she also has a stent in her left upper arm.  She has an old AV fistula in her right upper arm.  Old AV fistula in her left groin area.  And also states that she had some sort of vein graft in that area.    The clinic noted that her heart rate was 170 blood pressure systolic was [id number].  Patient has a history of atrial fibrillation.  Our notes seem to imply that she is on amiodarone, Lopressor, and Eliquis.  But did not receive any paperwork from the nursing facility.  We attempted to contact the nursing facility but we did not get any paperwork on her blood medicines.  She did arrive here with a heart rate of 160 and blood pressure was [id number] systolic.  Patient cannot tell her heart rate was fast she was not having any breathing problems.  Denied any pain other than the chronic pain to her leg.  Patient  reportedly was transferred here because [facility] does not have dialysis capabilities.  The transfer was for the rapid heart rate presumed to be atrial fibrillation with RVR.      Past Medical History:  Diagnosis Date   Anemia    ASCVD (arteriosclerotic cardiovascular disease)    a. s/p prior stenting of LAD b. NSTEMI in 01/2018 requiring DES x3 to RCA 2/2 spiral dissection from ost->mid RCA but residual dzs in dRCA, LAD & OM2; c. 09/2019 Cath/PCI: LM nl, LAD 50 ISR, 65 p/m, 8m(2.5x15 Resolute Onyx DES), 90d, LCX 99d, OM2 99 (2.25x26 Resolute Onyx DES), RCA 40p/233mSR, 85d, RPDA 100ost, RPAV 50. EF 50-55%.   Calciphylaxis    Chronic abdominal wound infection    COVID-19 virus infection 11/2020   [facility]   Diastolic dysfunction    a. 11/2020 Echo: EF 55-60%, mod conc LVh, gr2 DD, nl RV size/fxn. Sev dil LA. Triv MR. Mod-Sev mitral annular Ca2+. Mild-mod Ao sclerosis w/o stenosis.   ESRD (end stage renal disease) (H[place]   Due to membranous GN dialysis 09/1996; peritoneal dialysis --? peitonitis; difficult vascular access-->HD MWF.   Gangrene of left foot (HCLeon   a. 11/2020 L forefoot dry gangrene involving 4th and 5th MTP joints & digits, & R 5th MTP joint.   GERD (  gastroesophageal reflux disease)    Hashimoto thyroiditis    Hyperparathyroidism    Hypertension    Hypothyroidism    Medically noncompliant    Morbid obesity ([place])    PAD (peripheral artery disease) ([place])    a. 11/2020 PTA of L peroneal, PTA/thrombectomy, Viabahn stenting x 2 to the L SFA and popliteal arteries (6x239m & 6x1553m.   Persistent atrial fibrillation (HCM[place]   a. Noted during hospitalization 11/2020.    Patient Active Problem List   Diagnosis Date Noted   Hypotension    Atrial fibrillation with RVR (HCC)    Rectal bleeding 01/20/2021   Shock (HCTrent[place]02/25/2022   Cellulitis of left toe 01/09/2021   Wound eschar of foot    Coronary artery disease involving native coronary artery of  native heart without angina pectoris    PAD (peripheral artery disease) (HCPly[place]   Infected wound 12/14/2020   COVID-19 10/03/2020   Allergy, unspecified, initial encounter 06/15/2020   Anaphylactic shock, unspecified, initial encounter 06/15/2020   Chest pain 10/13/2019   ACS (acute coronary syndrome) (HCTeter[place]11/17/2020   Atrial fibrillation (HCNorth Fair Oaks11/17/2020   ASCVD (arteriosclerotic cardiovascular disease)    Anaphylactic reaction due to adverse effect of correct drug or medicament properly administered, initial encounter 08/12/2019   Superficial injury of right index finger with infection 12/25/2018   Lymphedema 04/21/2018   Chronic venous insufficiency 04/21/2018   Hyperlipidemia, unspecified 03/03/2018   Non-STEMI (non-ST elevated myocardial infarction) (HCFlordell Hills03/17/2019   Other abnormalities of gait and mobility 01/04/2017   Very low level of personal hygiene 01/03/2017   Other disorders resulting from impaired renal tubular function 10/01/2016   Hyperparathyroidism (HCSardis City07/18/2017   HTN (hypertension) 06/12/2016   Nonrheumatic aortic valve stenosis 06/04/2016   Glomerulonephritis 05/24/2016   History of asthma 05/24/2016   History of TIA (transient ischemic attack) 05/24/2016   Vocal cord dysfunction 05/24/2016   BMI 40.0-44.9, adult (HCButler06/29/2017   Other pruritus 05/21/2016   Other fluid overload 12/08/2015   Shortness of breath 07/27/2014   Mild protein-calorie malnutrition (HCHoughton05/26/2015   Hypocalcemia 03/27/2014   ESRD on dialysis (HCDaphned[place]05/12/2013   Hypothyroidism 03/27/2014   Chronic abdominal wound infection 03/27/2014   Calciphylaxis 03/27/2014   End stage renal disease (HCGates05/12/2013   Iron deficiency anemia, unspecified 01/16/2014   Coagulation defect, unspecified (HCWorth12/01/2013   Diarrhea, unspecified 10/28/2013   Other specified symptoms and signs involving the circulatory and respiratory systems 10/28/2013   Hypertensive chronic kidney disease with  stage 5 chronic kidney disease or end stage renal disease (HCBaker01/26/2014   Cardiovascular disease 09/22/2010   Atherosclerotic heart disease of native coronary artery without angina pectoris 09/22/2010   SYNCOPE 07/16/2010   POSTURAL LIGHTHEADEDNESS 07/16/2010   OBESITY 07/14/2010   GASTROESOPHAGEAL REFLUX DISEASE 07/14/2010   PALPITATIONS 07/14/2010   HYPERPARATHYROIDISM, HX OF 07/14/2010   Gastro-esophageal reflux disease without esophagitis 07/14/2010   Personal history of other endocrine, nutritional and metabolic disease [id number] Secondary hyperparathyroidism of renal origin (HCBayside11/11/2008   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 10/06/2008   Allergy status to other drugs, medicaments and biological substances 03/18/2004   Patient's noncompliance with other medical treatment and regimen 03/18/2004    Past Surgical History:  Procedure Laterality Date   AMPUTATION Left 01/01/2021   Procedure: AMPUTATION RAY-4th & 5th;  Surgeon: FoSamara DeistDPM;  Location: [facility] ORS;  Service: Podiatry;  Laterality: Left;   BTL  1992   CORONARY STENT INTERVENTION N/A  10/21/2019   Procedure: CORONARY STENT INTERVENTION;  Surgeon: Wellington Hampshire, MD;  Location: [place] CV LAB;  Service: Cardiovascular;  Laterality: N/A;   LEFT HEART CATH AND CORONARY ANGIOGRAPHY N/A 02/11/2018   Procedure: LEFT HEART CATH AND CORONARY ANGIOGRAPHY;  Surgeon: Charolette Forward, MD;  Location: [place] CV LAB;  Service: Cardiovascular;  Laterality: N/A;   LEFT HEART CATH AND CORONARY ANGIOGRAPHY N/A 10/14/2019   Procedure: LEFT HEART CATH AND CORONARY ANGIOGRAPHY;  Surgeon: Wellington Hampshire, MD;  Location: [place] CV LAB;  Service: Cardiovascular;  Laterality: N/A;   LOWER EXTREMITY ANGIOGRAPHY Left 12/22/2020   Procedure: Lower Extremity Angiography;  Surgeon: Algernon Huxley, MD;  Location: [place] CV LAB;  Service: Cardiovascular;  Laterality: Left;    LOWER EXTREMITY ANGIOGRAPHY Right 12/30/2020   Procedure: Lower Extremity Angiography;  Surgeon: Algernon Huxley, MD;  Location: [place] CV LAB;  Service: Cardiovascular;  Laterality: Right;   THYROIDECTOMY, PARTIAL     Resectin of left lobe with reimplantation in the forearm, small focus of papillary carcinoma incidentally noted at pathology - 2000 and Hashimoto's thyrdoiditis in 2001   TONSILLECTOMY AND ADENOIDECTOMY       OB History     Gravida  1   Para  1   Term      Preterm  1   AB      Living         SAB      IAB      Ectopic      Multiple      Live Births              Family History  Problem Relation Age of Onset   Coronary artery disease Mother    Kidney disease Father    Diabetes Sister     Social History   Tobacco Use   Smoking status: Former    Pack years: 0.00   Smokeless tobacco: Never  Vaping Use   Vaping Use: Never used  Substance Use Topics   Alcohol use: No   Drug use: No    Home Medications Prior to Admission medications   Medication Sig Start Date End Date Taking? Authorizing Provider  acetaminophen (TYLENOL) 325 MG tablet Take 650 mg by mouth every 6 (six) hours as needed.    [provider]  albuterol (PROVENTIL HFA;VENTOLIN HFA) 108 (90 BASE) MCG/ACT inhaler Inhale 2 puffs into the lungs every 6 (six) hours as needed. Shortness of breath    [provider]  amiodarone (PACERONE) 200 MG tablet Take by mouth. 01/26/21 01/26/22  [provider]  amoxicillin-clavulanate (AUGMENTIN) 875-125 MG tablet Take 1 tablet by mouth 2 (two) times daily. 02/06/21   Kris Hartmann, NP  apixaban (ELIQUIS) 5 MG TABS tablet Take 1 tablet (5 mg total) by mouth 2 (two) times daily. 01/09/21   Fritzi Mandes, MD  ascorbic acid (VITAMIN C) 500 MG tablet Take 500 mg by mouth 2 (two) times daily.    [provider]  aspirin EC 81 MG tablet Take 81 mg by mouth daily. Swallow whole.    [provider]  atorvastatin  (LIPITOR) 40 MG tablet Take 1 tablet (40 mg total) by mouth daily. 01/09/21   Fritzi Mandes, MD  b complex-vitamin c-folic acid (NEPHRO-VITE) 0.8 MG TABS tablet Take 1 tablet by mouth daily.    [provider]  calcitRIOL (ROCALTROL) 0.25 MCG capsule Take 3 capsules (0.75 mcg total) by mouth every Monday, Wednesday,  and Friday with hemodialysis. 12/19/20   Johnson, Clanford L, MD  Calcium Acetate 667 MG TABS Take by mouth in the morning, at noon, in the evening, and at bedtime.    [provider]  cyclobenzaprine (FLEXERIL) 10 MG tablet Take 10 mg by mouth 3 (three) times daily.    [provider]  EPINEPHrine 0.3 mg/0.3 mL IJ SOAJ injection Inject 0.3 mg into the muscle as needed.    [provider]  folic acid (FOLVITE) A999333 MCG tablet Take 400 mcg by mouth daily.    [provider]  gabapentin (NEURONTIN) 100 MG capsule Take 1 capsule (100 mg total) by mouth at bedtime. 12/20/20   Johnson, Clanford L, MD  HYDROcodone-acetaminophen (NORCO/VICODIN) 5-325 MG tablet TAKE (1) TABLET BY MOUTH EVERY (6) HOURS AS NEEDED. 01/09/21   Fritzi Mandes, MD  levothyroxine (SYNTHROID, LEVOTHROID) 300 MCG tablet Take 300 mcg by mouth at bedtime. SYNTHROID ONLY-BRAND NAME MEDICALLY NECESSARY    [provider]  metoprolol tartrate (LOPRESSOR) 25 MG tablet Take 1 tablet (25 mg total) by mouth 2 (two) times daily. 01/09/21   Fritzi Mandes, MD  Multiple Vitamin (MULTIVITAMIN ADULT PO) Take by mouth.    [provider]  nitroGLYCERIN (NITROSTAT) 0.4 MG SL tablet Place 1 tablet (0.4 mg total) under the tongue every 5 (five) minutes x 3 doses as needed for chest pain. 02/14/18   Charolette Forward, MD  Nutritional Supplements (PROMOD) LIQD Take 30 mLs by mouth in the morning and at bedtime.    [provider]  pantoprazole (PROTONIX) 40 MG tablet Take 1 tablet (40 mg total) by mouth at bedtime. 12/16/20   Johnson, Clanford L, MD  polyethylene glycol (MIRALAX / GLYCOLAX)  17 g packet Take 17 g by mouth 2 (two) times daily. 12/20/20   Johnson, Clanford L, MD  sevelamer carbonate (RENVELA) 800 MG tablet Take 1,600-3,200 mg by mouth 3 (three) times daily with meals. '3200mg'$  with meals and '1600mg'$  with snacks    [provider]  simethicone (MYLICON) 80 MG chewable tablet Chew 80 mg by mouth every 6 (six) hours as needed for flatulence.    [provider]  traMADol (ULTRAM) 50 MG tablet Take 1 tablet (50 mg total) by mouth every 6 (six) hours as needed (breakthrough for thigh pain). 02/06/21   Kris Hartmann, NP  vitamin B-12 (CYANOCOBALAMIN) 500 MCG tablet Take 500 mcg by mouth daily.    [provider]    Allergies    Alteplase, Bee pollen, Bee venom, Warfarin, and Warfarin sodium  Review of Systems   Review of Systems  Constitutional:  Negative for chills and fever.  HENT:  Negative for ear pain and sore throat.   Eyes:  Negative for pain and visual disturbance.  Respiratory:  Negative for cough and shortness of breath.   Cardiovascular:  Negative for chest pain and palpitations.  Gastrointestinal:  Negative for abdominal pain and vomiting.  Genitourinary:  Negative for dysuria and hematuria.  Musculoskeletal:  Negative for arthralgias and back pain.  Skin:  Positive for wound. Negative for color change and rash.  Neurological:  Negative for seizures and syncope.  All other systems reviewed and are negative.  Physical Exam Updated Vital Signs BP (!) 79/52   Pulse (!) 102   Temp (!) 97.5 F (36.4 C) (Oral)   Resp 12   Ht 1.6 m ('5\' 3"'$ )   Wt 116 kg   SpO2 96%   BMI 45.30 kg/m   Physical Exam  Vitals and nursing note reviewed.  Constitutional:      General: She is not in acute distress.    Appearance: She is well-developed. She is obese. She is ill-appearing.  HENT:     Head: Normocephalic and atraumatic.  Eyes:     Extraocular Movements: Extraocular movements intact.     Conjunctiva/sclera: Conjunctivae normal.      Pupils: Pupils are equal, round, and reactive to light.  Cardiovascular:     Rate and Rhythm: Tachycardia present. Rhythm irregular.     Heart sounds: No murmur heard. Pulmonary:     Effort: Pulmonary effort is normal. No respiratory distress.     Breath sounds: Normal breath sounds.     Comments: Dialysis catheter right anterior chest. Abdominal:     Palpations: Abdomen is soft.     Tenderness: There is no abdominal tenderness.  Musculoskeletal:        General: Swelling present.     Cervical back: Neck supple.     Right lower leg: Edema present.     Left lower leg: Edema present.     Comments: Left upper extremity with a very proximal IV.  Old AV fistula to the left arm.  Radial pulses are 2+ to the left upper extremity.  Difficult to palpate in the right upper extremity.  But patient claims she has had a stent placed there.  Patient with scarring to the left groin area.  Where patient states she had an AV fistula in the past.  No scarring to the right groin area.  The right lower extremity dressing was taken down.  Large wounds per tickly behind the knee probably measuring about 20 cm x 10 cm.  And also on the one on the back of the calf measuring probably about 5 x 7 cm.  No purulent discharge.  There is some erythema to the edges of the wound no significant induration or spreading of erythema.  These wounds actually look reasonably healthy.  Patient's cap refill to her toes are less than 2 seconds in her fingers.  They left foot has amputation of the fourth and fifth toes.  Skin:    General: Skin is warm and dry.     Capillary Refill: Capillary refill takes less than 2 seconds.  Neurological:     General: No focal deficit present.     Mental Status: She is alert and oriented to person, place, and time.    ED Results / Procedures / Treatments   Labs (all labs ordered are listed, but only abnormal results are displayed) Labs Reviewed  CBC WITH DIFFERENTIAL/PLATELET - Abnormal; Notable  for the following components:      Result Value   WBC 21.0 (*)    RBC 3.62 (*)    Hemoglobin 9.7 (*)    HCT 31.2 (*)    RDW 21.4 (*)    Neutro Abs 17.7 (*)    Monocytes Absolute 1.8 (*)    Abs Immature Granulocytes 0.26 (*)    All other components within normal limits  COMPREHENSIVE METABOLIC PANEL - Abnormal; Notable for the following components:   Potassium 3.2 (*)    Chloride 95 (*)    CO2 21 (*)    Creatinine, Ser 2.88 (*)    Total Protein 5.0 (*)    Albumin 1.8 (*)    Alkaline Phosphatase 259 (*)    Total Bilirubin 1.3 (*)    GFR, Estimated 19 (*)    Anion gap 21 (*)    All  other components within normal limits  CULTURE, BLOOD (ROUTINE X 2)  CULTURE, BLOOD (ROUTINE X 2)  RESP PANEL BY RT-PCR (FLU A&B, COVID) ARPGX2  LACTIC ACID, PLASMA  LACTIC ACID, PLASMA  PROCALCITONIN  PROCALCITONIN    EKG EKG Interpretation  Date/Time:  Friday May 26 2021 10:47:05 EDT Ventricular Rate:  162 PR Interval:  118 QRS Duration: 125 QT Interval:  312 QTC Calculation: 513 R Axis:   -34 Text Interpretation: Sinus tachycardia Ventricular premature complex Left bundle branch block Confirmed by Fredia Sorrow [phone]) on 05/26/2021 10:57:27 AM  Radiology DG Knee 1-2 Views Right  Result Date: 05/26/2021 CLINICAL DATA:  Increasing knee pain following wound debridement, initial encounter EXAM: RIGHT KNEE - 1-2 VIEW COMPARISON:  03/07/2019 FINDINGS: No acute fracture or dislocation is noted. Degenerative changes are noted worst in the lateral joint space. Prior femoropopliteal artery stenting is noted. IMPRESSION: Degenerative change without acute abnormality. Electronically Signed   By: Inez Catalina M.D.   On: 05/26/2021 13:25   DG Chest Port 1 View  Result Date: 05/26/2021 CLINICAL DATA:  Sepsis. EXAM: PORTABLE CHEST 1 VIEW COMPARISON:  Chest x-ray dated December 14, 2020. FINDINGS: Unchanged tunneled right internal jugular dialysis catheter. Stable cardiomegaly. Normal pulmonary  vascularity. No focal consolidation, pleural effusion, or pneumothorax. No acute osseous abnormality. Prior thyroidectomy. Proximal right upper extremity vascular stents again noted. IMPRESSION: 1. No active disease. Electronically Signed   By: Titus Dubin M.D.   On: 05/26/2021 12:49    Procedures Procedures   CRITICAL CARE Performed by: Fredia Sorrow Total critical care time: 60 minutes Critical care time was exclusive of separately billable procedures and treating other patients. Critical care was necessary to treat or prevent imminent or life-threatening deterioration. Critical care was time spent personally by me on the following activities: development of treatment plan with patient and/or surrogate as well as nursing, discussions with consultants, evaluation of patient's response to treatment, examination of patient, obtaining history from patient or surrogate, ordering and performing treatments and interventions, ordering and review of laboratory studies, ordering and review of radiographic studies, pulse oximetry and re-evaluation of patient's condition.   Medications Ordered in ED Medications  lactated ringers infusion (has no administration in time range)  lactated ringers bolus 500 mL (has no administration in time range)  ceFEPIme (MAXIPIME) 2 g in sodium chloride 0.9 % 100 mL IVPB (has no administration in time range)  vancomycin (VANCOREADY) IVPB 1500 mg/300 mL (has no administration in time range)  fentaNYL (SUBLIMAZE) injection 100 mcg (has no administration in time range)  HYDROmorphone (DILAUDID) injection 1 mg (1 mg Intravenous Given 05/26/21 1156)  metoprolol tartrate (LOPRESSOR) injection 5 mg (5 mg Intravenous Given 05/26/21 1157)  ceFEPIme (MAXIPIME) 2 g in sodium chloride 0.9 % 100 mL IVPB (0 g Intravenous Stopped 05/26/21 1330)  metroNIDAZOLE (FLAGYL) IVPB 500 mg (0 mg Intravenous Stopped 05/26/21 1437)  vancomycin (VANCOREADY) IVPB 2000 mg/400 mL (2,000 mg Intravenous  New Bag/Given 05/26/21 1437)    ED Course  I have reviewed the triage vital signs and the nursing notes.  Pertinent labs & imaging results that were available during my care of the patient were reviewed by me and considered in my medical decision making (see chart for details).    MDM Rules/Calculators/A&P                          Initial concern was the atrial fibrillation.  Heart rate was 160 here.  Patient was  given some hydromorphone for the pain in her leg.  Thinking that could be causing some of the tachycardia.  Patient given 5 mg of Lopressor IV.  This brought her heart rate down into the low 100s.  Did bring her blood pressures down into the 90 systolic.  But her maps were around 71.   Patient's white blood cell came back at 20,000+ this alerted concerns for infection so broad-spectrum antibiotics were started.  Sepsis protocol was initiated.  Patient did not receive the full 30 cc fluid challenge because of being a dialysis patient.  At that point in time she was not hypotensive.  Lactic acid was normal.  But did give 500 cc of fluid.  And did start some maintenance.  Chest x-ray negative.  We ran into some difficulties with some low blood pressures.  But they seem not to be accurate.  We eventually figured out a way to test her blood pressure in her left upper extremity with arm in correct position and we got pretty consistent blood pressures around 90 systolic.  But soft.  Patient also with very difficult IV or even central line access.  The only prominent place that would be a possibility would be the left internal jugular area.  Patient did have thyroid and parathyroid removed so there is a big scar in that area.  On the right side patient has her dialysis catheter there.  The right groin is not a good site because of the potential infection from the wound or seeding of the from the wounds there.  And her left groin has scarring and apparently had an AV fistula before and had some  sort of venous graft.  Based on this did consult critical care Dr. Halford Chessman came to see the patient along with the hospitalist and myself Dr. Sheran Lawless.  Dr. Halford Chessman felt that blood pressures were reasonable for her being a dialysis patient.  And that we were okay at this point for access.  Because I was thinking that if we needed access with her being very difficult and being on Eliquis was going to consult general surgery on-call to do this during the daytime first this is occurring later in the evening.  But he felt that the renal dialysis catheter could be used if needed in emergency and the patient could be transferred to [facility].  Discussed also with nephrology who is aware of the patient who has him on the list to consult.  Also she clearly looks like she was just dialyzed yesterday and seem to have no significant fluid overload so they will watch her.  But she may not need dialysis today or tomorrow. Final Clinical Impression(s) / ED Diagnoses Final diagnoses:  Atrial fibrillation with RVR (HCC)  Sepsis, due to unspecified organism, unspecified whether acute organ dysfunction present [facility])  ESRD on hemodialysis [facility])    Rx / DC Orders ED Discharge Orders     None        Fredia Sorrow, MD 05/26/21 1713

## 2021-05-26 NOTE — Consult Note (Addendum)
NAME:  Lori [name], MRN:  [id number], DOB:  [birth year], LOS: 0 ADMISSION DATE:  05/26/2021, CONSULTATION DATE:  05/26/2021 REFERRING MD:  Dr. Rogene Houston, ER, CHIEF COMPLAINT:  Low blood pressure   History of Present Illness:  50 yo with A fib, ESRD and chronic Rt leg wound was seen at wound care and noted to have A fib with RVR.  Sent to ER.  Noted to have elevated WBC.  Given pain medication and lopressor and then BP remained low.  She is not having chest pain, dyspnea, abdominal pain, or nausea.  Has chronic right leg pain.  Started on IV fluids and antibiotics in ER.  Has difficult IV access with multiple prior locations for AV grafts.  Currently gets HD through Rt IJ catheter.  Pertinent  Medical History  ESRD, COVID in January 2022, GERD, Hypothyroidism, A fib, Lower GI bleeding from rectal ulcer  Significant Hospital Events: Including procedures, antibiotic start and stop dates in addition to other pertinent events   7/01 Admit, start ABx  Interim History / Subjective:    Objective   Blood pressure (!) 79/52, pulse (!) 102, temperature (!) 97.5 F (36.4 C), temperature source Oral, resp. rate 12, height '5\' 3"'$  (1.6 m), weight 116 kg, SpO2 96 %.       No intake or output data in the 24 hours ending 05/26/21 1604 Filed Weights   05/26/21 1056  Weight: 116 kg    Examination: General: alert HENT: no stridor Lungs: no wheeze/rales Cardiovascular: irregular, tachycardic Abdomen: soft, non tender, + bowel sounds Extremities: wound packing in right lower thigh and upper calf area posteriorly with clean margins Neuro: alert, follows commands   Resolved Hospital Problem list     Assessment & Plan:   Hypotension with concern for sepsis. - continue ABx, IV fluids - check cortisol level - would accept SBP of > 85, MAP > 60 so long as her mental status remains intact - central venous access will be difficult to obtain; if she requires pressor agents then might need to check with  nephrology if her HD catheter can be accessed for pressor agents  A fib with RVR. Hypothyroidism. Chronic Rt leg wound. - per primary team  ESRD. - nephrology consulted  Anemia of chronic disease. - f/u CBC - transfuse for Hb < 7  Best Practice (right click and "Reselect all SmartList Selections" daily)   Diet/type: Regular consistency (see orders) DVT prophylaxis: DOAC GI prophylaxis: PPI Lines: Dialysis Catheter Foley:  N/A Code Status:  full code Last date of multidisciplinary goals of care discussion [+]  Labs   CBC: Recent Labs  Lab 05/26/21 1123  WBC 21.0*  NEUTROABS 17.7*  HGB 9.7*  HCT 31.2*  MCV 86.2  PLT [id number]    Basic Metabolic Panel: Recent Labs  Lab 05/26/21 1123  NA 137  K 3.2*  CL 95*  CO2 21*  GLUCOSE 86  BUN 10  CREATININE 2.88*  CALCIUM 9.0   GFR: Estimated Creatinine Clearance: 28.4 mL/min (A) (by C-G formula based on SCr of 2.88 mg/dL (H)). Recent Labs  Lab 05/26/21 1123 05/26/21 1250  WBC 21.0*  --   LATICACIDVEN  --  1.5    Liver Function Tests: Recent Labs  Lab 05/26/21 1123  AST 21  ALT 15  ALKPHOS 259*  BILITOT 1.3*  PROT 5.0*  ALBUMIN 1.8*   No results for input(s): LIPASE, AMYLASE in the last 168 hours. No results for input(s): AMMONIA in the last 168 hours.  ABG No results found for: PHART, PCO2ART, PO2ART, HCO3, TCO2, ACIDBASEDEF, O2SAT   Coagulation Profile: No results for input(s): INR, PROTIME in the last 168 hours.  Cardiac Enzymes: No results for input(s): CKTOTAL, CKMB, CKMBINDEX, TROPONINI in the last 168 hours.  HbA1C: Hgb A1c MFr Bld  Date/Time Value Ref Range Status  10/15/2019 02:30 AM 5.1 4.8 - 5.6 % Final    Comment:    (NOTE) Pre diabetes:          5.7%-6.4% Diabetes:              >6.4% Glycemic control for   <7.0% adults with diabetes     CBG: No results for input(s): GLUCAP in the last 168 hours.  Review of Systems:   Reviewed and negative  Past Medical History:  She,  has  a past medical history of Anemia, ASCVD (arteriosclerotic cardiovascular disease), Calciphylaxis, Chronic abdominal wound infection, COVID-19 virus infection (11/2020), Dialysis patient ([place]), Diastolic dysfunction, ESRD (end stage renal disease) ([place]), Gangrene of left foot ([place]), GERD (gastroesophageal reflux disease), Hashimoto thyroiditis, Hyperparathyroidism, Hypertension, Hypothyroidism, Medically noncompliant, Morbid obesity ([place]), PAD (peripheral artery disease) ([place]), and Persistent atrial fibrillation ([place]).   Surgical History:   Past Surgical History:  Procedure Laterality Date   AMPUTATION Left 01/01/2021   Procedure: AMPUTATION RAY-4th & 5th;  Surgeon: Samara Deist, DPM;  Location: [facility];  Service: Podiatry;  Laterality: Left;   BTL  1992   CORONARY STENT INTERVENTION N/A 10/21/2019   Procedure: CORONARY STENT INTERVENTION;  Surgeon: Wellington Hampshire, MD;  Location: [place] [facility];  Service: Cardiovascular;  Laterality: N/A;   LEFT HEART CATH AND CORONARY ANGIOGRAPHY N/A 02/11/2018   Procedure: LEFT HEART CATH AND CORONARY ANGIOGRAPHY;  Surgeon: Charolette Forward, MD;  Location: [place] [facility];  Service: Cardiovascular;  Laterality: N/A;   LEFT HEART CATH AND CORONARY ANGIOGRAPHY N/A 10/14/2019   Procedure: LEFT HEART CATH AND CORONARY ANGIOGRAPHY;  Surgeon: Wellington Hampshire, MD;  Location: [place] [facility];  Service: Cardiovascular;  Laterality: N/A;   LOWER EXTREMITY ANGIOGRAPHY Left 12/22/2020   Procedure: Lower Extremity Angiography;  Surgeon: Algernon Huxley, MD;  Location: [place] [facility];  Service: Cardiovascular;  Laterality: Left;   LOWER EXTREMITY ANGIOGRAPHY Right 12/30/2020   Procedure: Lower Extremity Angiography;  Surgeon: Algernon Huxley, MD;  Location: [place] [facility];  Service: Cardiovascular;  Laterality: Right;   THYROIDECTOMY, PARTIAL     Resectin of left lobe with reimplantation in the forearm, small focus of papillary carcinoma incidentally noted at  pathology - 2000 and Hashimoto's thyrdoiditis in 2001   TONSILLECTOMY AND ADENOIDECTOMY       Social History:   reports that she has quit smoking. She has never used smokeless tobacco. She reports that she does not drink alcohol and does not use drugs.   Family History:  Her family history includes Coronary artery disease in her mother; Diabetes in her sister; Kidney disease in her father.   Allergies Allergies  Allergen Reactions   Alteplase Shortness Of Breath and Anaphylaxis   Bee Pollen Anaphylaxis   Bee Venom Anaphylaxis   Warfarin Anaphylaxis   Warfarin Sodium Nausea And Vomiting and Rash     Home Medications  Prior to Admission medications   Medication Sig Start Date End Date Taking? Authorizing Provider  acetaminophen (TYLENOL) 325 MG tablet Take 650 mg by mouth every 6 (six) hours as needed.    [provider]  albuterol (PROVENTIL HFA;VENTOLIN HFA) 108 (90  BASE) MCG/ACT inhaler Inhale 2 puffs into the lungs every 6 (six) hours as needed. Shortness of breath    [provider]  amiodarone (PACERONE) 200 MG tablet Take by mouth. 01/26/21 01/26/22  [provider]  amoxicillin-clavulanate (AUGMENTIN) 875-125 MG tablet Take 1 tablet by mouth 2 (two) times daily. 02/06/21   Kris Hartmann, NP  apixaban (ELIQUIS) 5 MG TABS tablet Take 1 tablet (5 mg total) by mouth 2 (two) times daily. 01/09/21   Fritzi Mandes, MD  ascorbic acid (VITAMIN C) 500 MG tablet Take 500 mg by mouth 2 (two) times daily.    [provider]  aspirin EC 81 MG tablet Take 81 mg by mouth daily. Swallow whole.    [provider]  atorvastatin (LIPITOR) 40 MG tablet Take 1 tablet (40 mg total) by mouth daily. 01/09/21   Fritzi Mandes, MD  b complex-vitamin c-folic acid (NEPHRO-VITE) 0.8 MG TABS tablet Take 1 tablet by mouth daily.    [provider]  calcitRIOL (ROCALTROL) 0.25 MCG capsule Take 3 capsules (0.75 mcg total) by mouth every Monday, Wednesday, and Friday  with hemodialysis. 12/19/20   Johnson, Clanford L, MD  Calcium Acetate 667 MG TABS Take by mouth in the morning, at noon, in the evening, and at bedtime.    [provider]  cyclobenzaprine (FLEXERIL) 10 MG tablet Take 10 mg by mouth 3 (three) times daily.    [provider]  EPINEPHrine 0.3 mg/0.3 mL IJ SOAJ injection Inject 0.3 mg into the muscle as needed.    [provider]  folic acid (FOLVITE) A999333 MCG tablet Take 400 mcg by mouth daily.    [provider]  gabapentin (NEURONTIN) 100 MG capsule Take 1 capsule (100 mg total) by mouth at bedtime. 12/20/20   Johnson, Clanford L, MD  HYDROcodone-acetaminophen (NORCO/VICODIN) 5-325 MG tablet TAKE (1) TABLET BY MOUTH EVERY (6) HOURS AS NEEDED. 01/09/21   Fritzi Mandes, MD  levothyroxine (SYNTHROID, LEVOTHROID) 300 MCG tablet Take 300 mcg by mouth at bedtime. SYNTHROID ONLY-BRAND NAME MEDICALLY NECESSARY    [provider]  metoprolol tartrate (LOPRESSOR) 25 MG tablet Take 1 tablet (25 mg total) by mouth 2 (two) times daily. 01/09/21   Fritzi Mandes, MD  Multiple Vitamin (MULTIVITAMIN ADULT PO) Take by mouth.    [provider]  nitroGLYCERIN (NITROSTAT) 0.4 MG SL tablet Place 1 tablet (0.4 mg total) under the tongue every 5 (five) minutes x 3 doses as needed for chest pain. 02/14/18   Charolette Forward, MD  Nutritional Supplements (PROMOD) LIQD Take 30 mLs by mouth in the morning and at bedtime.    [provider]  pantoprazole (PROTONIX) 40 MG tablet Take 1 tablet (40 mg total) by mouth at bedtime. 12/16/20   Johnson, Clanford L, MD  polyethylene glycol (MIRALAX / GLYCOLAX) 17 g packet Take 17 g by mouth 2 (two) times daily. 12/20/20   Johnson, Clanford L, MD  sevelamer carbonate (RENVELA) 800 MG tablet Take 1,600-3,200 mg by mouth 3 (three) times daily with meals. '3200mg'$  with meals and '1600mg'$  with snacks    [provider]  simethicone (MYLICON) 80 MG chewable tablet Chew 80 mg by mouth every  6 (six) hours as needed for flatulence.    [provider]  traMADol (ULTRAM) 50 MG tablet Take 1 tablet (50 mg total) by mouth every 6 (six) hours as needed (breakthrough for thigh pain). 02/06/21   Kris Hartmann, NP  vitamin B-12 (CYANOCOBALAMIN) 500 MCG tablet Take  500 mcg by mouth daily.    [provider]     Critical care time: 32 minutes  Chesley Mires, MD [place] Pager - [phone] 05/26/2021, 4:14 PM

## 2021-05-26 NOTE — Progress Notes (Signed)
Pharmacy Antibiotic Note  Lori [name] is a 50 y.o. female admitted on 05/26/2021 with  unknown source of infection .  Pharmacy has been consulted for Vancomycin and Cefepime dosing.  Plan: Vancomycin 2000 mg IV x 1 dose. Vanco 1500 mg IV every 48 hours. Cefepime 2000 mg IV every 24 hours. Monitor labs, c/s, and vanco level as indicated.  Height: '5\' 3"'$  (160 cm) Weight: 116 kg (255 lb 11.7 oz) IBW/kg (Calculated) : 52.4  Temp (24hrs), Avg:97.5 F (36.4 C), Min:97.5 F (36.4 C), Max:97.5 F (36.4 C)  Recent Labs  Lab 05/26/21 1123  WBC 21.0*  CREATININE 2.88*    Estimated Creatinine Clearance: 28.4 mL/min (A) (by C-G formula based on SCr of 2.88 mg/dL (H)).    Allergies  Allergen Reactions   Alteplase Shortness Of Breath and Anaphylaxis   Bee Pollen Anaphylaxis   Bee Venom Anaphylaxis   Warfarin Anaphylaxis   Warfarin Sodium Nausea And Vomiting and Rash    Antimicrobials this admission: Cefepime 7/1 >>  Vanco 7/1 >>  Flagyl 7/1   Microbiology results: 7/1 BCx: pending   Thank you for allowing pharmacy to be a part of this patient's care.  Ramond Craver 05/26/2021 1:05 PM

## 2021-05-27 LAB — BASIC METABOLIC PANEL
Anion gap: >20 — ABNORMAL HIGH (ref 5–15)
BUN: 14 mg/dL (ref 6–20)
CO2: 20 mmol/L — ABNORMAL LOW (ref 22–32)
Calcium: 9 mg/dL (ref 8.9–10.3)
Chloride: 97 mmol/L — ABNORMAL LOW (ref 98–111)
Creatinine, Ser: 3.43 mg/dL — ABNORMAL HIGH (ref 0.44–1.00)
GFR, Estimated: 16 mL/min — ABNORMAL LOW (ref >60)
Glucose, Bld: 70 mg/dL (ref 70–99)
Potassium: 3.4 mmol/L — ABNORMAL LOW (ref 3.5–5.1)
Sodium: 138 mmol/L (ref 135–145)

## 2021-05-27 LAB — CORTISOL-AM, BLOOD: Cortisol - AM: 13.5 ug/dL (ref 6.7–22.6)

## 2021-05-27 LAB — CBC
HCT: 28.6 % — ABNORMAL LOW (ref 36.0–46.0)
Hemoglobin: 8.6 g/dL — ABNORMAL LOW (ref 12.0–15.0)
MCH: 26.5 pg (ref 26.0–34.0)
MCHC: 30.1 g/dL (ref 30.0–36.0)
MCV: 88 fL (ref 80.0–100.0)
Platelets: 334 10*3/uL (ref 150–400)
RBC: 3.25 MIL/uL — ABNORMAL LOW (ref 3.87–5.11)
RDW: 21.4 % — ABNORMAL HIGH (ref 11.5–15.5)
WBC: 18.5 10*3/uL — ABNORMAL HIGH (ref 4.0–10.5)
nRBC: 0 % (ref 0.0–0.2)

## 2021-05-27 LAB — MRSA NEXT GEN BY PCR, NASAL: MRSA by PCR Next Gen: NOT DETECTED

## 2021-05-27 LAB — TSH: TSH: 1.609 u[IU]/mL (ref 0.350–4.500)

## 2021-05-27 LAB — PROCALCITONIN: Procalcitonin: 4.72 ng/mL

## 2021-05-27 MED ORDER — SODIUM CHLORIDE 0.9 % IV SOLN
1.0000 g | INTRAVENOUS | Status: DC
  Administered 2021-05-27 – 2021-05-28 (×2): 1 g via INTRAVENOUS
  Filled 2021-05-27 (×4): qty 1

## 2021-05-27 MED ORDER — HEPARIN SODIUM (PORCINE) 1000 UNIT/ML DIALYSIS
4300.0000 [IU] | INTRAMUSCULAR | Status: DC | PRN
  Administered 2021-05-27: 4300 [IU] via INTRAVENOUS_CENTRAL
  Filled 2021-05-27 (×3): qty 5

## 2021-05-27 MED ORDER — MIDODRINE HCL 5 MG PO TABS
10.0000 mg | ORAL_TABLET | Freq: Once | ORAL | Status: AC
  Administered 2021-05-27: 10 mg via ORAL
  Filled 2021-05-27: qty 2

## 2021-05-27 MED ORDER — HEPARIN SODIUM (PORCINE) 1000 UNIT/ML DIALYSIS: 1000.0000 [IU] | INTRAMUSCULAR | Status: DC | PRN

## 2021-05-27 MED ORDER — ALBUMIN HUMAN 25 % IV SOLN
25.0000 g | Freq: Once | INTRAVENOUS | Status: AC
  Administered 2021-05-27: 25 g via INTRAVENOUS
  Filled 2021-05-27: qty 100

## 2021-05-27 MED ORDER — CHLORHEXIDINE GLUCONATE CLOTH 2 % EX PADS
6.0000 | MEDICATED_PAD | Freq: Every day | CUTANEOUS | Status: DC
  Administered 2021-05-29: 6 via TOPICAL

## 2021-05-27 MED ORDER — SODIUM CHLORIDE 0.9 % IV SOLN: 100.0000 mL | INTRAVENOUS | Status: DC | PRN

## 2021-05-27 NOTE — Procedures (Signed)
HEMODIALYSIS TREATMENT NOTE:  3.5 hour heparin-free HD completed via right chest wall TDC (retrograde flow).  Goal met: 2 liters removed without interruption in UF. Midodrine 10mg and Albumin 25g IVP were each given once for SBP<100.    HR 110s up to 135, non-sustained during the last hour of treatment; asymptomatic.   Pt was having frequent "leg muscle spasms" which she rated 9/10.  Primary nurse administered analgesics. Amiodarone and metoprolol are due this evening.  Post HD BP 103/76 pulse 110.  As pt received Cefepime 2g IV this morning at 0900 (prior to dialysis), an addition 1g dose was ordered.  This was given during the last 30 minutes of treatment.  All blood was returned.  Angela Poteat, RN 

## 2021-05-27 NOTE — Consult Note (Addendum)
[facility] Nephrology Consult Note: Reason for Consult: To manage dialysis and dialysis related needs Referring Physician: Dr Manuella Ghazi, Pratik  HPI:  Lori [name] is an 50 y.o. female with history of hypertension, Hashimoto thyroiditis status post thyroidectomy, parathyroidectomy, PAD, A. fib, ESRD on HD TTS, gangrene and wound of lower extremity, sent from wound center for heart rate of 160s, seen as a consultation for the management of ESRD. Patient reported that she used to get dialysis Monday Wednesday Friday at [facility] in in [place].  However, now she goes to [facility] in ED and TTS schedule because she is a staying in the [facility] in [place].  She is following closely at the wound center in [facility] for right thigh and upper leg wounds.  She was at the wound center yesterday when they noted elevated heart rate.  She was sent to the hospital for further evaluation.  In the ER the heart rate was 160s.  She received IV metoprolol with improvement of heart rate.  She was found to have leukocytosis with WBC count of 20 1K, afebrile and hypotension.  COVID test was negative.  Cultures were obtained and patient was started on broad-spectrum antibiotics. Patient reported that her last dialysis was on Thursday.  Usually gets 4 hours.  She does not aware of the medications, dry weight etc. Now she complains of body spasm and neck pain.  She denies nausea, vomiting, chest pain, shortness of breath.   Past Medical History:  Diagnosis Date   Anemia    ASCVD (arteriosclerotic cardiovascular disease)    a. s/p prior stenting of LAD b. NSTEMI in 01/2018 requiring DES x3 to RCA 2/2 spiral dissection from ost->mid RCA but residual dzs in dRCA, LAD & OM2; c. 09/2019 Cath/PCI: LM nl, LAD 50 ISR, 65 p/m, 64m(2.5x15 Resolute Onyx DES), 90d, LCX 99d, OM2 99 (2.25x26 Resolute Onyx DES), RCA 40p/223mSR, 85d, RPDA 100ost, RPAV 50. EF 50-55%.   Calciphylaxis    Chronic abdominal  wound infection    COVID-19 virus infection 11/2020   [facility]   Diastolic dysfunction    a. 11/2020 Echo: EF 55-60%, mod conc LVh, gr2 DD, nl RV size/fxn. Sev dil LA. Triv MR. Mod-Sev mitral annular Ca2+. Mild-mod Ao sclerosis w/o stenosis.   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 peitonitis; difficult vascular access-->HD MWF.   Gangrene of left foot ([facility]   a. 11/2020 L forefoot dry gangrene involving 4th and 5th MTP joints & digits, & R 5th MTP joint.   GERD (gastroesophageal reflux disease)    Hashimoto thyroiditis    Hyperparathyroidism    Hypertension    Hypothyroidism    Medically noncompliant    Morbid obesity ([facility]   PAD [place]peripheral artery disease) ([facility]   a. 11/2020 PTA of L peroneal, PTA/thrombectomy, Viabahn stenting x 2 to the L SFA and popliteal arteries (6x25045m 6x150m30m  Persistent atrial fibrillation (HCC)[place] a. Noted during hospitalization 11/2020.    Past Surgical History:  Procedure Laterality Date   AMPUTATION Left 01/01/2021   Procedure: AMPUTATION RAY-4th & 5th;  Surgeon: FowlSamara DeistM;  Location: [facility] ORS;  Service: Podiatry;  Laterality: Left;   BTL  1992   CORONARY STENT INTERVENTION N/A 10/21/2019   Procedure: CORONARY STENT INTERVENTION;  Surgeon: AridWellington Hampshire;  [facility];  Service: Cardiovascular;  Laterality:  N/A;   LEFT HEART CATH AND CORONARY ANGIOGRAPHY N/A 02/11/2018   Procedure: LEFT HEART CATH AND CORONARY ANGIOGRAPHY;  Surgeon: Charolette Forward, MD;  Location: [place] [facility] LAB;  Service: Cardiovascular;  Laterality: N/A;   LEFT HEART CATH AND CORONARY ANGIOGRAPHY N/A 10/14/2019   Procedure: LEFT HEART CATH AND CORONARY ANGIOGRAPHY;  Surgeon: Wellington Hampshire, MD;  Location: [place] [facility] LAB;  Service: Cardiovascular;  Laterality: N/A;   LOWER EXTREMITY ANGIOGRAPHY Left 12/22/2020   Procedure: Lower Extremity Angiography;  Surgeon:  Algernon Huxley, MD;  Location: [place] [facility] LAB;  Service: Cardiovascular;  Laterality: Left;   LOWER EXTREMITY ANGIOGRAPHY Right 12/30/2020   Procedure: Lower Extremity Angiography;  Surgeon: Algernon Huxley, MD;  Location: [place] [facility];  Service: Cardiovascular;  Laterality: Right;   THYROIDECTOMY, PARTIAL     Resectin of left lobe with reimplantation in the forearm, small focus of papillary carcinoma incidentally noted at pathology - 2000 and Hashimoto's thyrdoiditis in 2001   TONSILLECTOMY AND ADENOIDECTOMY      Family History  Problem Relation Age of Onset   Coronary artery disease Mother    Kidney disease Father    Diabetes Sister     Social History:  reports that she has quit smoking. She has never used smokeless tobacco. She reports that she does not drink alcohol and does not use drugs.  Allergies:  Allergies  Allergen Reactions   Alteplase Shortness Of Breath and Anaphylaxis   Bee Pollen Anaphylaxis   Bee Venom Anaphylaxis   Warfarin Anaphylaxis   Warfarin Sodium Nausea And Vomiting and Rash    Medications: I have reviewed the patient's current medications.   Results for orders placed or performed during the hospital encounter of 05/26/21 (from the past 48 hour(s))  CBC with Differential/Platelet     Status: Abnormal   Collection Time: 05/26/21 11:23 AM  Result Value Ref Range   WBC 21.0 (H) 4.0 - 10.5 K/uL   RBC 3.62 (L) 3.87 - 5.11 MIL/uL   Hemoglobin 9.7 (L) 12.0 - 15.0 g/dL   HCT 31.2 (L) 36.0 - 46.0 %   MCV 86.2 80.0 - 100.0 fL   MCH 26.8 26.0 - 34.0 pg   MCHC 31.1 30.0 - 36.0 g/dL   RDW 21.4 (H) 11.5 - 15.5 %   Platelets 394 150 - 400 K/uL   nRBC 0.0 0.0 - 0.2 %   Neutrophils Relative % 83 %   Neutro Abs 17.7 (H) 1.7 - 7.7 K/uL   Lymphocytes Relative 5 %   Lymphs Abs 1.0 0.7 - 4.0 K/uL   Monocytes Relative 9 %   Monocytes Absolute 1.8 (H) 0.1 - 1.0 K/uL   Eosinophils Relative 1 %   Eosinophils Absolute 0.1 0.0 - 0.5 K/uL   Basophils Relative 1  %   Basophils Absolute 0.1 0.0 - 0.1 K/uL   Immature Granulocytes 1 %   Abs Immature Granulocytes 0.26 (H) 0.00 - 0.07 K/uL   Polychromasia PRESENT     Comment: Performed at [facility], [address] [place], [place] [address]  Comprehensive metabolic panel     Status: Abnormal   Collection Time: 05/26/21 11:23 AM  Result Value Ref Range   Sodium 137 135 - 145 mmol/L   Potassium 3.2 (L) 3.5 - 5.1 mmol/L   Chloride 95 (L) 98 - 111 mmol/L   CO2 21 (L) 22 - 32 mmol/L    Comment: RESULT REPEATED AND VERIFIED   Glucose, Bld 86 70 -  99 mg/dL    Comment: Glucose reference range applies only to samples taken after fasting for at least 8 hours.   BUN 10 6 - 20 mg/dL   Creatinine, Ser 2.88 (H) 0.44 - 1.00 mg/dL   Calcium 9.0 8.9 - 10.3 mg/dL   Total Protein 5.0 (L) 6.5 - 8.1 g/dL   Albumin 1.8 (L) 3.5 - 5.0 g/dL   AST 21 15 - 41 U/L   ALT 15 0 - 44 U/L   Alkaline Phosphatase 259 (H) 38 - 126 U/L   Total Bilirubin 1.3 (H) 0.3 - 1.2 mg/dL   GFR, Estimated 19 (L) >60 mL/min    Comment: (NOTE) Calculated using the CKD-EPI Creatinine Equation (2021)    Anion gap 21 (H) 5 - 15    Comment: Performed at [facility], [address] [place], [place] [address]  Resp Panel by RT-PCR (Flu A&B, Covid) Nasopharyngeal Swab     Status: None   Collection Time: 05/26/21 12:20 PM   Specimen: Nasopharyngeal Swab; Nasopharyngeal(NP) swabs in vial transport medium  Result Value Ref Range   SARS Coronavirus 2 by RT PCR NEGATIVE NEGATIVE    Comment: (NOTE) SARS-CoV-2 target nucleic acids are NOT DETECTED.  The SARS-CoV-2 RNA is generally detectable in upper respiratory specimens during the acute phase of infection. The lowest concentration of SARS-CoV-2 viral copies this assay can detect is 138 copies/mL. A negative result does not preclude SARS-Cov-2 infection and should not be used as the sole basis for treatment or other patient management decisions. A negative result may occur with  improper  specimen collection/handling, submission of specimen other than nasopharyngeal swab, presence of viral mutation(s) within the areas targeted by this assay, and inadequate number of viral copies(<138 copies/mL). A negative result must be combined with clinical observations, patient history, and epidemiological information. The expected result is Negative.  Fact Sheet for Patients:  EntrepreneurPulse.com.au  Fact Sheet for Healthcare Providers:  IncredibleEmployment.be  This test is no t yet approved or cleared by the [place] FDA and  has been authorized for detection and/or diagnosis of SARS-CoV-2 by FDA under an Emergency Use Authorization (EUA). This EUA will remain  in effect (meaning this test can be used) for the duration of the COVID-19 declaration under Section 564(b)(1) of the Act, 21 U.S.C.section 360bbb-3(b)(1), unless the authorization is terminated  or revoked sooner.       Influenza A by PCR NEGATIVE NEGATIVE   Influenza B by PCR NEGATIVE NEGATIVE    Comment: (NOTE) The Xpert Xpress SARS-CoV-2/FLU/RSV plus assay is intended as an aid in the diagnosis of influenza from Nasopharyngeal swab specimens and should not be used as a sole basis for treatment. Nasal washings and aspirates are unacceptable for Xpert Xpress SARS-CoV-2/FLU/RSV testing.  Fact Sheet for Patients: EntrepreneurPulse.com.au  Fact Sheet for Healthcare Providers: IncredibleEmployment.be  This test is not yet approved or cleared by the [place] FDA and has been authorized for detection and/or diagnosis of SARS-CoV-2 by FDA under an Emergency Use Authorization (EUA). This EUA will remain in effect (meaning this test can be used) for the duration of the COVID-19 declaration under Section 564(b)(1) of the Act, 21 U.S.C. section 360bbb-3(b)(1), unless the authorization is terminated or revoked.  Performed at [facility], [address], [place],  [address]   Culture, blood (Routine X 2) w Reflex to ID Panel     Status: None (Preliminary result)   Collection Time: 05/26/21 12:49 PM   Specimen: Left Antecubital; Blood  Result Value Ref Range   Specimen Description      LEFT ANTECUBITAL BOTTLES DRAWN AEROBIC AND ANAEROBIC   Special Requests Blood Culture adequate volume    Culture      NO GROWTH < 24 HOURS Performed at [facility], [address], [place], [place] [address]    Report Status PENDING   Culture, blood (Routine X 2) w Reflex to ID Panel     Status: None (Preliminary result)   Collection Time: 05/26/21 12:50 PM   Specimen: BLOOD LEFT WRIST  Result Value Ref Range   Specimen Description BLOOD LEFT WRIST BOTTLES DRAWN AEROBIC ONLY    Special Requests Blood Culture adequate volume    Culture      NO GROWTH < 24 HOURS Performed at [facility], [address], [place], [place] [address]    Report Status PENDING   Lactic acid, plasma     Status: None   Collection Time: 05/26/21 12:50 PM  Result Value Ref Range   Lactic Acid, Venous 1.5 0.5 - 1.9 mmol/L    Comment: Performed at [facility], [address] [place], [place] [address]  Lactic acid, plasma     Status: None   Collection Time: 05/26/21  4:49 PM  Result Value Ref Range   Lactic Acid, Venous 1.8 0.5 - 1.9 mmol/L    Comment: Performed at [facility], [address], [place], [place] [address]  Procalcitonin - Baseline     Status: None   Collection Time: 05/26/21  4:49 PM  Result Value Ref Range   Procalcitonin 5.79 ng/mL    Comment:        Interpretation: PCT > 2 ng/mL: Systemic infection (sepsis) is likely, unless other causes are known. (NOTE)       Sepsis PCT Algorithm           Lower Respiratory Tract                                      Infection PCT Algorithm    ----------------------------     ----------------------------         PCT < 0.25 ng/mL                PCT < 0.10 ng/mL          Strongly encourage              Strongly discourage   discontinuation of antibiotics    initiation of antibiotics    ----------------------------     -----------------------------       PCT 0.25 - 0.50 ng/mL            PCT 0.10 - 0.25 ng/mL               OR       >80% decrease in PCT            Discourage initiation of                                            antibiotics      Encourage discontinuation           of antibiotics    ----------------------------     -----------------------------         PCT >= 0.50 ng/mL  PCT 0.26 - 0.50 ng/mL               AND       <80% decrease in PCT              Encourage initiation of                                             antibiotics       Encourage continuation           of antibiotics    ----------------------------     -----------------------------        PCT >= 0.50 ng/mL                  PCT > 0.50 ng/mL               AND         increase in PCT                  Strongly encourage                                      initiation of antibiotics    Strongly encourage escalation           of antibiotics                                     -----------------------------                                           PCT <= 0.25 ng/mL                                                 OR                                        > 80% decrease in PCT                                      Discontinue / Do not initiate                                             antibiotics  Performed at [facility], [address], [place], [place] [address]   MRSA Next Gen by PCR, Nasal     Status: None   Collection Time: 05/26/21  6:21 PM   Specimen: Nasal Mucosa; Nasal Swab  Result Value Ref Range   MRSA by PCR Next Gen NOT DETECTED NOT DETECTED    Comment: (NOTE) The GeneXpert MRSA Assay (FDA approved for NASAL specimens only), is one component of a comprehensive MRSA colonization surveillance program.  It is not intended to diagnose MRSA infection nor to guide or  monitor treatment for MRSA infections. Test performance is not FDA approved in patients less than 84 years old. Performed at [facility], [address], [place], [place] [address]   Procalcitonin     Status: None   Collection Time: 05/27/21  4:56 AM  Result Value Ref Range   Procalcitonin 4.72 ng/mL    Comment:        Interpretation: PCT > 2 ng/mL: Systemic infection (sepsis) is likely, unless other causes are known. (NOTE)       Sepsis PCT Algorithm           Lower Respiratory Tract                                      Infection PCT Algorithm    ----------------------------     ----------------------------         PCT < 0.25 ng/mL                PCT < 0.10 ng/mL          Strongly encourage             Strongly discourage   discontinuation of antibiotics    initiation of antibiotics    ----------------------------     -----------------------------       PCT 0.25 - 0.50 ng/mL            PCT 0.10 - 0.25 ng/mL               OR       >80% decrease in PCT            Discourage initiation of                                            antibiotics      Encourage discontinuation           of antibiotics    ----------------------------     -----------------------------         PCT >= 0.50 ng/mL              PCT 0.26 - 0.50 ng/mL               AND       <80% decrease in PCT              Encourage initiation of                                             antibiotics       Encourage continuation           of antibiotics    ----------------------------     -----------------------------        PCT >= 0.50 ng/mL                  PCT > 0.50 ng/mL               AND         increase in PCT  Strongly encourage                                      initiation of antibiotics    Strongly encourage escalation           of antibiotics                                     -----------------------------                                           PCT <= 0.25 ng/mL                                                  OR                                        > 80% decrease in PCT                                      Discontinue / Do not initiate                                             antibiotics  Performed at [facility], [address], [place], [place] [id number]   Basic metabolic panel     Status: Abnormal   Collection Time: 05/27/21  4:56 AM  Result Value Ref Range   Sodium 138 135 - 145 mmol/L   Potassium 3.4 (L) 3.5 - 5.1 mmol/L   Chloride 97 (L) 98 - 111 mmol/L   CO2 20 (L) 22 - 32 mmol/L   Glucose, Bld 70 70 - 99 mg/dL    Comment: Glucose reference range applies only to samples taken after fasting for at least 8 hours.   BUN 14 6 - 20 mg/dL   Creatinine, Ser 3.43 (H) 0.44 - 1.00 mg/dL   Calcium 9.0 8.9 - 10.3 mg/dL   GFR, Estimated 16 (L) >60 mL/min    Comment: (NOTE) Calculated using the CKD-EPI Creatinine Equation (2021)    Anion gap >20 (H) 5 - 15    Comment: Electrolytes repeated to confirm. Performed at [facility], [address], [place], [place] [address]   CBC     Status: Abnormal   Collection Time: 05/27/21  4:56 AM  Result Value Ref Range   WBC 18.5 (H) 4.0 - 10.5 K/uL   RBC 3.25 (L) 3.87 - 5.11 MIL/uL   Hemoglobin 8.6 (L) 12.0 - 15.0 g/dL   HCT 28.6 (L) 36.0 - 46.0 %   MCV 88.0 80.0 - 100.0 fL   MCH 26.5 26.0 - 34.0 pg   MCHC 30.1 30.0 - 36.0 g/dL   RDW 21.4 (H) 11.5 - 15.5 %   Platelets 334 150 - 400 K/uL   nRBC  0.0 0.0 - 0.2 %    Comment: Performed at [facility], [address], [place], [place] [address]  TSH     Status: None   Collection Time: 05/27/21  4:56 AM  Result Value Ref Range   TSH 1.609 0.350 - 4.500 uIU/mL    Comment: Performed by a 3rd Generation assay with a functional sensitivity of <=0.01 uIU/mL. Performed at [facility], [address], [place], [place] [address]     DG Knee 1-2 Views Right  Result Date: 05/26/2021 CLINICAL DATA:  Increasing knee pain following wound debridement, initial encounter EXAM: RIGHT  KNEE - 1-2 VIEW COMPARISON:  03/07/2019 FINDINGS: No acute fracture or dislocation is noted. Degenerative changes are noted worst in the lateral joint space. Prior femoropopliteal artery stenting is noted. IMPRESSION: Degenerative change without acute abnormality. Electronically Signed   By: Inez Catalina M.D.   On: 05/26/2021 13:25   DG Chest Port 1 View  Result Date: 05/26/2021 CLINICAL DATA:  Sepsis. EXAM: PORTABLE CHEST 1 VIEW COMPARISON:  Chest x-ray dated December 14, 2020. FINDINGS: Unchanged tunneled right internal jugular dialysis catheter. Stable cardiomegaly. Normal pulmonary vascularity. No focal consolidation, pleural effusion, or pneumothorax. No acute osseous abnormality. Prior thyroidectomy. Proximal right upper extremity vascular stents again noted. IMPRESSION: 1. No active disease. Electronically Signed   By: Titus Dubin M.D.   On: 05/26/2021 12:49    ROS: As per H&P.  Rest of systems reviewed and are negative. Blood pressure (!) 112/54, pulse 73, temperature 97.7 F (36.5 C), temperature source Oral, resp. rate 13, height '5\' 3"'$  (1.6 m), weight 94 kg, SpO2 99 %. Gen: NAD, comfortable Respiratory: Clear bilateral, no wheezing or crackle Cardiovascular: Regular rate rhythm S1-S2 normal, no rubs GI: Abdomen soft, nontender, nondistended Extremities, bilateral lower extremities has pitting edema, right leg has dressing applied. Skin: No rash or ulcer Neurology: Alert, awake, following commands, oriented Dialysis Access: Right IJ TDC.  Assessment/Plan:  #A. fib with RVR: Heart rate is controlled with amiodarone and metoprolol.  On Eliquis for anticoagulation.  #Leukocytosis concerning for wound infection.  Currently on broad-spectrum antibiotics.  Follow-up culture results.  Management per primary team.  # ESRD TTS schedule: Plan to resume regular dialysis today.  She has lower extremity edema therefore we will attempt around 2 L UF if tolerated by BP.  Right IJ for the access.   Please avoid fentanyl, baclofen if possible.  Informed dialysis nurse.  # Hypertension: Blood pressure is soft, on metoprolol for tachycardia.  May need midodrine and albumin t for intradialytic hypotension.  # Anemia of ESRD: Hemoglobin below goal.  Need to obtain outpatient record.  No iron because of possible infection.  Transfuse PRBC as needed.  # Metabolic Bone Disease: Phosphorus level at goal.  She is on PhosLo, Renvela and calcitriol.  Monitor calcium phosphorus level.  Thank you for the consult.  We will review her chart over the weekend, please call back with question.  Zaedyn Covin Tanna Furry 05/27/2021, 11:51 AM

## 2021-05-27 NOTE — Progress Notes (Signed)
PROGRESS NOTE    GENIVA LOOTENS  [id number] DOB: [birth year] DOA: 05/26/2021 PCP: Patient, No Pcp Per (Inactive)   Brief Narrative:   SALLYANN PRIER is a 50 y.o. female with a history of end-stage renal disease on dialysis, hypertension, Hashimoto's thyroiditis status post thyroidectomy and parathyroidectomy, peripheral artery disease with history of gangrene of several digits of her feet, chronic atrial fibrillation on anticoagulation.  She has been seen by her outpatient wound care facility with improvement noted in her wounds, however her heart rate was noted to be in the 160 bpm range and therefore she was sent to the ED for further evaluation.  She was admitted with atrial fibrillation with RVR as well as leukocytosis.  Heart rate and blood pressures have now stabilized and she has been started on broad-spectrum antibiotics for questionable wound infection.  Plans for hemodialysis per nephrology on 7/2.  Assessment & Plan:   Principal Problem:   Atrial fibrillation with RVR (HCC) Active Problems:   Cardiovascular disease   ESRD on dialysis ([place])   Hypothyroidism   BMI 40.0-44.9, adult (HCC)   HTN (hypertension)   Atrial fibrillation (HCC)   Leukocytosis   Atrial fibrillation with RVR in the setting of coronary artery disease -Continue to monitor on telemetry, especially with hemodialysis -Continue amiodarone and metoprolol -Eliquis for anticoagulation  Leukocytosis likely related to wound infection -Continue current broad-spectrum antibiotics -Wound care with dressing changes anticipated for 7/3 -Monitor CBC in a.m.  Anemia of ESRD -No iron due to infection -Transfuse PRBC as needed -Monitor CBC  Hypothyroidism -Continue levothyroxine -TSH 1.609  Hypertension -Noted to have softer blood pressures overnight which have now improved along with heart rate -Continue metoprolol and amiodarone -May require some midodrine for hypotension during dialysis  ESRD on HD  TTS -Appreciate nephrology for hemodialysis 7/2  Right lower extremity wound -Continue wet-to-dry dressing changes and follow-up outpatient  DVT prophylaxis: Eliquis Code Status: Full Family Communication: Tried calling daughter 7/2 with no response Disposition Plan:  Status is: Inpatient  Remains inpatient appropriate because:Ongoing diagnostic testing needed not appropriate for outpatient work up, Unsafe d/c plan, and IV treatments appropriate due to intensity of illness or inability to take PO  Dispo: The patient is from: SNF              Anticipated d/c is to: SNF              Patient currently is not medically stable to d/c.   Difficult to place patient No  Consultants:  Nephrology  Procedures:  See below  Antimicrobials:  Anti-infectives (From admission, onward)    Start     Dose/Rate Route Frequency Ordered Stop   05/28/21 1300  vancomycin (VANCOREADY) IVPB 1500 mg/300 mL        1,500 mg 150 mL/hr over 120 Minutes Intravenous Every 48 hours 05/26/21 1308     05/27/21 1000  ceFEPIme (MAXIPIME) 2 g in sodium chloride 0.9 % 100 mL IVPB        2 g 200 mL/hr over 30 Minutes Intravenous Every 24 hours 05/26/21 1304     05/26/21 1300  vancomycin (VANCOREADY) IVPB 2000 mg/400 mL        2,000 mg 200 mL/hr over 120 Minutes Intravenous  Once 05/26/21 1234 05/26/21 1637   05/26/21 1230  ceFEPIme (MAXIPIME) 2 g in sodium chloride 0.9 % 100 mL IVPB        2 g 200 mL/hr over 30 Minutes Intravenous  Once 05/26/21 1227 05/26/21  1330   05/26/21 1230  metroNIDAZOLE (FLAGYL) IVPB 500 mg        500 mg 100 mL/hr over 60 Minutes Intravenous  Once 05/26/21 1227 05/26/21 1437   05/26/21 1230  vancomycin (VANCOCIN) IVPB 1000 mg/200 mL premix  Status:  Discontinued        1,000 mg 200 mL/hr over 60 Minutes Intravenous  Once 05/26/21 1227 05/26/21 1234       Subjective: Patient seen and evaluated today and appears mostly somnolent.  Noted to have some lower blood pressures and elevated  heart rates overnight which have now improved.  Objective: Vitals:   05/27/21 1030 05/27/21 1100 05/27/21 1118 05/27/21 1130  BP:    (!) 112/54  Pulse: 72 70 74 73  Resp: '14 13 19 13  '$ Temp:   97.7 F (36.5 C)   TempSrc:   Oral   SpO2: 100% 98% 100% 99%  Weight:      Height:        Intake/Output Summary (Last 24 hours) at 05/27/2021 1219 Last data filed at 05/27/2021 0900 Gross per 24 hour  Intake 1516.12 ml  Output --  Net 1516.12 ml   Filed Weights   05/26/21 1056 05/26/21 1804 05/27/21 0453  Weight: 116 kg 91.5 kg 94 kg    Examination:  General exam: Appears calm and comfortable, somnolent, dialysis catheter to right IJ Respiratory system: Clear to auscultation. Respiratory effort normal. Cardiovascular system: S1 & S2 heard, irregular rate.  Gastrointestinal system: Abdomen is soft Central nervous system: Somnolent but arousable Extremities: Wound to right thigh with dressings clean dry and intact Skin: No significant lesions noted Psychiatry: Flat affect.    Data Reviewed: I have personally reviewed following labs and imaging studies  CBC: Recent Labs  Lab 05/26/21 1123 05/27/21 0456  WBC 21.0* 18.5*  NEUTROABS 17.7*  --   HGB 9.7* 8.6*  HCT 31.2* 28.6*  MCV 86.2 88.0  PLT 394 [id number]   Basic Metabolic Panel: Recent Labs  Lab 05/26/21 1123 05/27/21 0456  NA 137 138  K 3.2* 3.4*  CL 95* 97*  CO2 21* 20*  GLUCOSE 86 70  BUN 10 14  CREATININE 2.88* 3.43*  CALCIUM 9.0 9.0   GFR: Estimated Creatinine Clearance: 21.1 mL/min (A) (by C-G formula based on SCr of 3.43 mg/dL (H)). Liver Function Tests: Recent Labs  Lab 05/26/21 1123  AST 21  ALT 15  ALKPHOS 259*  BILITOT 1.3*  PROT 5.0*  ALBUMIN 1.8*   No results for input(s): LIPASE, AMYLASE in the last 168 hours. No results for input(s): AMMONIA in the last 168 hours. Coagulation Profile: No results for input(s): INR, PROTIME in the last 168 hours. Cardiac Enzymes: No results for input(s):  CKTOTAL, CKMB, CKMBINDEX, TROPONINI in the last 168 hours. BNP (last 3 results) No results for input(s): PROBNP in the last 8760 hours. HbA1C: No results for input(s): HGBA1C in the last 72 hours. CBG: No results for input(s): GLUCAP in the last 168 hours. Lipid Profile: No results for input(s): CHOL, HDL, LDLCALC, TRIG, CHOLHDL, LDLDIRECT in the last 72 hours. Thyroid Function Tests: Recent Labs    05/27/21 0456  TSH 1.609   Anemia Panel: No results for input(s): VITAMINB12, FOLATE, FERRITIN, TIBC, IRON, RETICCTPCT in the last 72 hours. Sepsis Labs: Recent Labs  Lab 05/26/21 1250 05/26/21 1649 05/27/21 0456  PROCALCITON  --  5.79 4.72  LATICACIDVEN 1.5 1.8  --     Recent Results (from the past 240 hour(s))  Resp  Panel by RT-PCR (Flu A&B, Covid) Nasopharyngeal Swab     Status: None   Collection Time: 05/26/21 12:20 PM   Specimen: Nasopharyngeal Swab; Nasopharyngeal(NP) swabs in vial transport medium  Result Value Ref Range Status   SARS Coronavirus 2 by RT PCR NEGATIVE NEGATIVE Final    Comment: (NOTE) SARS-CoV-2 target nucleic acids are NOT DETECTED.  The SARS-CoV-2 RNA is generally detectable in upper respiratory specimens during the acute phase of infection. The lowest concentration of SARS-CoV-2 viral copies this assay can detect is 138 copies/mL. A negative result does not preclude SARS-Cov-2 infection and should not be used as the sole basis for treatment or other patient management decisions. A negative result may occur with  improper specimen collection/handling, submission of specimen other than nasopharyngeal swab, presence of viral mutation(s) within the areas targeted by this assay, and inadequate number of viral copies(<138 copies/mL). A negative result must be combined with clinical observations, patient history, and epidemiological information. The expected result is Negative.  Fact Sheet for Patients:  EntrepreneurPulse.com.au  Fact  Sheet for Healthcare Providers:  IncredibleEmployment.be  This test is no t yet approved or cleared by the [place] FDA and  has been authorized for detection and/or diagnosis of SARS-CoV-2 by FDA under an Emergency Use Authorization (EUA). This EUA will remain  in effect (meaning this test can be used) for the duration of the COVID-19 declaration under Section 564(b)(1) of the Act, 21 U.S.C.section 360bbb-3(b)(1), unless the authorization is terminated  or revoked sooner.       Influenza A by PCR NEGATIVE NEGATIVE Final   Influenza B by PCR NEGATIVE NEGATIVE Final    Comment: (NOTE) The Xpert Xpress SARS-CoV-2/FLU/RSV plus assay is intended as an aid in the diagnosis of influenza from Nasopharyngeal swab specimens and should not be used as a sole basis for treatment. Nasal washings and aspirates are unacceptable for Xpert Xpress SARS-CoV-2/FLU/RSV testing.  Fact Sheet for Patients: EntrepreneurPulse.com.au  Fact Sheet for Healthcare Providers: IncredibleEmployment.be  This test is not yet approved or cleared by the [place] FDA and has been authorized for detection and/or diagnosis of SARS-CoV-2 by FDA under an Emergency Use Authorization (EUA). This EUA will remain in effect (meaning this test can be used) for the duration of the COVID-19 declaration under Section 564(b)(1) of the Act, 21 U.S.C. section 360bbb-3(b)(1), unless the authorization is terminated or revoked.  Performed at [facility], [address], [place], [place] [address]   Culture, blood (Routine X 2) w Reflex to ID Panel     Status: None (Preliminary result)   Collection Time: 05/26/21 12:49 PM   Specimen: Left Antecubital; Blood  Result Value Ref Range Status   Specimen Description   Final    LEFT ANTECUBITAL BOTTLES DRAWN AEROBIC AND ANAEROBIC   Special Requests Blood Culture adequate volume  Final   Culture   Final    NO GROWTH < 24  HOURS Performed at [facility], [address], [place], [place] [address]    Report Status PENDING  Incomplete  Culture, blood (Routine X 2) w Reflex to ID Panel     Status: None (Preliminary result)   Collection Time: 05/26/21 12:50 PM   Specimen: BLOOD LEFT WRIST  Result Value Ref Range Status   Specimen Description BLOOD LEFT WRIST BOTTLES DRAWN AEROBIC ONLY  Final   Special Requests Blood Culture adequate volume  Final   Culture   Final    NO GROWTH < 24 HOURS Performed at [facility],  [address], [place], [place] [address]    Report Status PENDING  Incomplete  MRSA Next Gen by PCR, Nasal     Status: None   Collection Time: 05/26/21  6:21 PM   Specimen: Nasal Mucosa; Nasal Swab  Result Value Ref Range Status   MRSA by PCR Next Gen NOT DETECTED NOT DETECTED Final    Comment: (NOTE) The GeneXpert MRSA Assay (FDA approved for NASAL specimens only), is one component of a comprehensive MRSA colonization surveillance program. It is not intended to diagnose MRSA infection nor to guide or monitor treatment for MRSA infections. Test performance is not FDA approved in patients less than 4 years old. Performed at [facility], [address], [place], [place] [address]          Radiology Studies: DG Knee 1-2 Views Right  Result Date: 05/26/2021 CLINICAL DATA:  Increasing knee pain following wound debridement, initial encounter EXAM: RIGHT KNEE - 1-2 VIEW COMPARISON:  03/07/2019 FINDINGS: No acute fracture or dislocation is noted. Degenerative changes are noted worst in the lateral joint space. Prior femoropopliteal artery stenting is noted. IMPRESSION: Degenerative change without acute abnormality. Electronically Signed   By: Inez Catalina M.D.   On: 05/26/2021 13:25   DG Chest Port 1 View  Result Date: 05/26/2021 CLINICAL DATA:  Sepsis. EXAM: PORTABLE CHEST 1 VIEW COMPARISON:  Chest x-ray dated December 14, 2020. FINDINGS: Unchanged tunneled right internal jugular dialysis  catheter. Stable cardiomegaly. Normal pulmonary vascularity. No focal consolidation, pleural effusion, or pneumothorax. No acute osseous abnormality. Prior thyroidectomy. Proximal right upper extremity vascular stents again noted. IMPRESSION: 1. No active disease. Electronically Signed   By: Titus Dubin M.D.   On: 05/26/2021 12:49        Scheduled Meds:  amiodarone  200 mg Oral Daily   apixaban  5 mg Oral BID   aspirin EC  81 mg Oral Daily   atorvastatin  40 mg Oral QPM   [START ON 05/29/2021] calcitRIOL  0.75 mcg Oral Q M,W,F-HD   calcium acetate  667 mg Oral TID PC & HS   Chlorhexidine Gluconate Cloth  6 each Topical Q0600   [START ON 05/28/2021] Chlorhexidine Gluconate Cloth  6 each Topical Q0600   cyclobenzaprine  10 mg Oral TID   gabapentin  100 mg Oral QHS   levothyroxine  300 mcg Oral q AM   metoprolol tartrate  25 mg Oral BID   pantoprazole  40 mg Oral QHS   polyethylene glycol  17 g Oral BID   sevelamer carbonate  3,200 mg Oral TID WC   silver sulfADIAZINE   Topical QODAY   Continuous Infusions:  ceFEPime (MAXIPIME) IV 2 g (05/27/21 0909)   [START ON 05/28/2021] vancomycin       LOS: 1 day    Time spent: 35 minutes    Ameria Sanjurjo Darleen Crocker, DO [facility]  If 7PM-7AM, please contact night-coverage [email or web address] 05/27/2021, 12:19 PM

## 2021-05-27 NOTE — Progress Notes (Addendum)
1945Pt crying in pain from left leg. Pt cries out when right leg spasms. Pt stated she has been taking the pain regiment that is ordered for about a month without any issues. RN paged MD on call for orders.   1950 Dr. Josephine Cables called back in informed RN to give a dose of pain meds now. Additional dose of norco given early for pain control. Pt still getting HD at bedside. RN assessed pt, see flow sheet. WCTM.   2015 Pt medicaed per MAR. Pt repositioned, HD done, 2L off. Pt resting in bed, yells out in pain when right leg spasms. [place]   2205 Pt called nursing station to be repositioned. Pt turned with help of Hayes Ludwig, Therapist, sports. Fall precautions in place, [facility].   0030 Pt crying out in pain, medicated per MAR, repositioned. Attempted to remove dressing to RLE to re-do, but pt refused. WCTM.

## 2021-05-28 ENCOUNTER — Encounter (HOSPITAL_COMMUNITY): Payer: Self-pay | Admitting: Family Medicine

## 2021-05-28 LAB — CBC
HCT: 27.2 % — ABNORMAL LOW (ref 36.0–46.0)
Hemoglobin: 8.1 g/dL — ABNORMAL LOW (ref 12.0–15.0)
MCH: 26 pg (ref 26.0–34.0)
MCHC: 29.8 g/dL — ABNORMAL LOW (ref 30.0–36.0)
MCV: 87.2 fL (ref 80.0–100.0)
Platelets: 272 10*3/uL (ref 150–400)
RBC: 3.12 MIL/uL — ABNORMAL LOW (ref 3.87–5.11)
RDW: 21.7 % — ABNORMAL HIGH (ref 11.5–15.5)
WBC: 17 10*3/uL — ABNORMAL HIGH (ref 4.0–10.5)
nRBC: 0 % (ref 0.0–0.2)

## 2021-05-28 LAB — BASIC METABOLIC PANEL
Anion gap: 15 (ref 5–15)
BUN: 10 mg/dL (ref 6–20)
CO2: 25 mmol/L (ref 22–32)
Calcium: 8.5 mg/dL — ABNORMAL LOW (ref 8.9–10.3)
Chloride: 97 mmol/L — ABNORMAL LOW (ref 98–111)
Creatinine, Ser: 2.42 mg/dL — ABNORMAL HIGH (ref 0.44–1.00)
GFR, Estimated: 24 mL/min — ABNORMAL LOW (ref >60)
Glucose, Bld: 81 mg/dL (ref 70–99)
Potassium: 3.1 mmol/L — ABNORMAL LOW (ref 3.5–5.1)
Sodium: 137 mmol/L (ref 135–145)

## 2021-05-28 LAB — MAGNESIUM: Magnesium: 1.7 mg/dL (ref 1.7–2.4)

## 2021-05-28 LAB — PROCALCITONIN: Procalcitonin: 3.75 ng/mL

## 2021-05-28 MED ORDER — HYDROCODONE-ACETAMINOPHEN 5-325 MG PO TABS: ORAL_TABLET | ORAL | 0 refills | Status: AC

## 2021-05-28 MED ORDER — VANCOMYCIN HCL IN DEXTROSE 1-5 GM/200ML-% IV SOLN: 1000.0000 mg | INTRAVENOUS | Status: DC

## 2021-05-28 MED ORDER — DOXYCYCLINE HYCLATE 100 MG PO CAPS: 100.0000 mg | ORAL_CAPSULE | Freq: Two times a day (BID) | ORAL | 0 refills | Status: AC

## 2021-05-28 MED ORDER — CYCLOBENZAPRINE HCL 10 MG PO TABS: 10.0000 mg | ORAL_TABLET | Freq: Three times a day (TID) | ORAL | 0 refills | Status: AC

## 2021-05-28 MED ORDER — TRAMADOL HCL 50 MG PO TABS: 50.0000 mg | ORAL_TABLET | Freq: Four times a day (QID) | ORAL | 0 refills | Status: AC | PRN

## 2021-05-28 MED ORDER — VANCOMYCIN HCL 1500 MG/300ML IV SOLN: 1500.0000 mg | Freq: Once | INTRAVENOUS | Status: DC

## 2021-05-28 NOTE — Progress Notes (Signed)
RN called due to swelling of right arm.  At bedside, patient states that her arm became swollen since a week ago.  Since admission, patient was able to have dialysis via right IJ, patient denies any pain or any discomfort in the right arm.  She was admitted due to A. fib with RVR which has since been controlled with amiodarone and metoprolol.  Patient was also already on anticoagulant (Eliquis) and patient was already discharged back to SNF after being deemed stable for discharge. Radial pulse in right hand was intact. Hand was warm to touch and appears well-perfused without any digital cyanosis.  Patient advised to follow-up with SNF's physician /PCP for any discomfort in right arm.

## 2021-05-28 NOTE — Discharge Summary (Signed)
Physician Discharge Summary  ARDELIA KNARR [id number] DOB: 01-24-1971 DOA: 05/26/2021  PCP: Patient, No Pcp Per (Inactive)  Admit date: 05/26/2021  Discharge date: 05/28/2021  Admitted From:SNF  Disposition:  SNF  Recommendations for Outpatient Follow-up:  Follow up with PCP in 1-2 weeks Recheck CBC in 1 week Please follow-up with wound care as scheduled every week Continue hemodialysis per usual schedule TTS with last hemodialysis 7/2 Continue on doxycycline for presumed wound infection to right lower extremity for the next 8 days to complete a total 10-day course of treatment  Home Health: None  Equipment/Devices: None  Discharge Condition:Stable  CODE STATUS: Full  Diet recommendation: Renal diet  Brief/Interim Summary:  ROBI EAVEY is a 50 y.o. female with a history of end-stage renal disease on dialysis, hypertension, Hashimoto's thyroiditis status post thyroidectomy and parathyroidectomy, peripheral artery disease with history of gangrene of several digits of her feet, chronic atrial fibrillation on anticoagulation.  She has been seen by her outpatient wound care facility with improvement noted in her wounds, however her heart rate was noted to be in the 160 bpm range and therefore she was sent to the ED for further evaluation.  She was admitted with atrial fibrillation with RVR as well as leukocytosis.  Her heart rates had spontaneously improved with the use of her home medications of amiodarone and metoprolol and no additional treatment was required.  She remains on Eliquis for anticoagulation as well.  She was empirically started on IV vancomycin and cefepime and blood cultures have remained negative over 24 hours and MRSA was also negative.  She did not appear to have a significant wound infection and is noted to have a chronic nonhealing wound down to her fat pad of right inner thigh.  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  She did  complain of some significant muscle spasms related to the wound, but this is not unusual for her.  No additional discharge was noted.  She will be discharged on doxycycline as noted above to complete a 10-day course of treatment for any potential cellulitis.  She has received hemodialysis while inpatient on 7/2 with no significant complications noted.  Plan to resume hemodialysis by 7/5 per her usual schedule.  Discharge Diagnoses:  Principal Problem:   Atrial fibrillation with RVR ([place]) Active Problems:   Cardiovascular disease   ESRD on dialysis ([place])   Hypothyroidism   BMI 40.0-44.9, adult (HCC)   HTN (hypertension)   Atrial fibrillation (HCC)   Leukocytosis  Principal discharge diagnosis: Atrial fibrillation with RVR with associated leukocytosis potentially related to infection of right lower extremity chronic wound.  Discharge Instructions  Discharge Instructions     Diet - low sodium heart healthy   Complete by: As directed    Discharge wound care:   Complete by: As directed    Dressing changes every other day. Rewrap right leg with Kerlix gauze and then Ace wraps.   Increase activity slowly   Complete by: As directed       Allergies as of 05/28/2021       Reactions   Alteplase Shortness Of Breath, Anaphylaxis   Bee Pollen Anaphylaxis   Bee Venom Anaphylaxis   Warfarin Anaphylaxis   Warfarin Sodium Nausea And Vomiting, Rash        Medication List     STOP taking these medications    amoxicillin-clavulanate 875-125 MG tablet Commonly known as: Augmentin  TAKE these medications    acetaminophen 325 MG tablet Commonly known as: TYLENOL Take 650 mg by mouth every 6 (six) hours as needed.   albuterol 108 (90 Base) MCG/ACT inhaler Commonly known as: VENTOLIN HFA Inhale 2 puffs into the lungs every 6 (six) hours as needed. Shortness of breath   amiodarone 200 MG tablet Commonly known as: PACERONE Take 200 mg by mouth daily.   apixaban 5 MG Tabs  tablet Commonly known as: ELIQUIS Take 1 tablet (5 mg total) by mouth 2 (two) times daily.   ascorbic acid 500 MG tablet Commonly known as: VITAMIN C Take 500 mg by mouth 2 (two) times daily.   aspirin EC 81 MG tablet Take 81 mg by mouth daily. Swallow whole.   atorvastatin 40 MG tablet Commonly known as: LIPITOR Take 1 tablet (40 mg total) by mouth daily.   b complex-vitamin c-folic acid 0.8 MG Tabs tablet Take 1 tablet by mouth daily.   calcitRIOL 0.25 MCG capsule Commonly known as: ROCALTROL Take 3 capsules (0.75 mcg total) by mouth every Monday, Wednesday, and Friday with hemodialysis.   Calcium Acetate 667 MG Tabs Take by mouth in the morning, at noon, in the evening, and at bedtime.   cyclobenzaprine 10 MG tablet Commonly known as: FLEXERIL Take 1 tablet (10 mg total) by mouth 3 (three) times daily.   doxycycline 100 MG capsule Commonly known as: VIBRAMYCIN Take 1 capsule (100 mg total) by mouth 2 (two) times daily for 8 days.   EPINEPHrine 0.3 mg/0.3 mL Soaj injection Commonly known as: EPI-PEN Inject 0.3 mg into the muscle as needed.   folic acid A999333 MCG tablet Commonly known as: FOLVITE Take 800 mcg by mouth daily.   gabapentin 100 MG capsule Commonly known as: NEURONTIN Take 1 capsule (100 mg total) by mouth at bedtime.   HYDROcodone-acetaminophen 5-325 MG tablet Commonly known as: NORCO/VICODIN TAKE (1) TABLET BY MOUTH EVERY (6) HOURS AS NEEDED.   levothyroxine 300 MCG tablet Commonly known as: SYNTHROID Take 300 mcg by mouth at bedtime. SYNTHROID ONLY-BRAND NAME MEDICALLY NECESSARY   metoprolol tartrate 25 MG tablet Commonly known as: LOPRESSOR Take 1 tablet (25 mg total) by mouth 2 (two) times daily.   MULTIVITAMIN ADULT PO Take by mouth.   nitroGLYCERIN 0.4 MG SL tablet Commonly known as: NITROSTAT Place 1 tablet (0.4 mg total) under the tongue every 5 (five) minutes x 3 doses as needed for chest pain.   pantoprazole 40 MG tablet Commonly  known as: PROTONIX Take 1 tablet (40 mg total) by mouth at bedtime. What changed: when to take this   polyethylene glycol 17 g packet Commonly known as: MIRALAX / GLYCOLAX Take 17 g by mouth 2 (two) times daily.   promethazine 25 MG tablet Commonly known as: PHENERGAN Take 25 mg by mouth every 4 (four) hours as needed for nausea or vomiting.   ProMod Liqd Take 30 mLs by mouth in the morning and at bedtime.   sevelamer carbonate 800 MG tablet Commonly known as: RENVELA Take 1,600-3,200 mg by mouth 3 (three) times daily with meals. '3200mg'$  with meals and '1600mg'$  with snacks   simethicone 80 MG chewable tablet Commonly known as: MYLICON Chew 80 mg by mouth every 6 (six) hours as needed for flatulence.   traMADol 50 MG tablet Commonly known as: ULTRAM Take 1 tablet (50 mg total) by mouth every 6 (six) hours as needed (breakthrough for thigh pain).   vitamin B-12 500 MCG tablet Commonly known as: CYANOCOBALAMIN  Take 500 mcg by mouth daily.   zinc gluconate 50 MG tablet Take 50 mg by mouth daily.               Discharge Care Instructions  (From admission, onward)           Start     Ordered   05/28/21 0000  Discharge wound care:       Comments: Dressing changes every other day. Rewrap right leg with Kerlix gauze and then Ace wraps.   05/28/21 0904            Allergies  Allergen Reactions   Alteplase Shortness Of Breath and Anaphylaxis   Bee Pollen Anaphylaxis   Bee Venom Anaphylaxis   Warfarin Anaphylaxis   Warfarin Sodium Nausea And Vomiting and Rash    Consultations: Nephrology   Procedures/Studies: DG Knee 1-2 Views Right  Result Date: 05/26/2021 CLINICAL DATA:  Increasing knee pain following wound debridement, initial encounter EXAM: RIGHT KNEE - 1-2 VIEW COMPARISON:  03/07/2019 FINDINGS: No acute fracture or dislocation is noted. Degenerative changes are noted worst in the lateral joint space. Prior femoropopliteal artery stenting is noted.  IMPRESSION: Degenerative change without acute abnormality. Electronically Signed   By: Inez Catalina M.D.   On: 05/26/2021 13:25   DG Chest Port 1 View  Result Date: 05/26/2021 CLINICAL DATA:  Sepsis. EXAM: PORTABLE CHEST 1 VIEW COMPARISON:  Chest x-ray dated December 14, 2020. FINDINGS: Unchanged tunneled right internal jugular dialysis catheter. Stable cardiomegaly. Normal pulmonary vascularity. No focal consolidation, pleural effusion, or pneumothorax. No acute osseous abnormality. Prior thyroidectomy. Proximal right upper extremity vascular stents again noted. IMPRESSION: 1. No active disease. Electronically Signed   By: Titus Dubin M.D.   On: 05/26/2021 12:49   VAS [place] ABI WITH/WO TBI  Result Date: 05/04/2021  LOWER EXTREMITY DOPPLER STUDY Patient Name:  ALETIA TOBAR  Date of Exam:   05/01/2021 Medical Rec #: [id number]     Accession #:    [id number] Date of Birth: 29-Mar-1971     Patient Gender: F Patient Age:   42Y Exam Location:  [facility] Procedure:      VAS [place] ABI WITH/WO TBI Referring Phys: [id number] Dolores Lory [place] --------------------------------------------------------------------------------  Indications: Rest pain, and ulceration.  Vascular Interventions: 12/22/2020 PTA of Lt peroneal artery, SFA and popliteal.                         Mechanical                         thrombectomy of Lt SFA and prox popliteal artey. Stent                         Lt SFA and popliteal artery.                         12/30/2020 PTA of Rt ATA, SFA and popliteal artery. Stent                         Rt SFA and above knee popliteal and bilateral CIA. Comparison Study: 02/06/2021 Performing Technologist: Almira Coaster RVS  Examination Guidelines: A complete evaluation includes at minimum, Doppler waveform signals and systolic blood pressure reading at the level of bilateral brachial, anterior tibial, and posterior tibial arteries, when vessel segments are accessible. Bilateral testing is  considered an  integral part of a complete examination. Photoelectric Plethysmograph (PPG) waveforms and toe systolic pressure readings are included as required and additional duplex testing as needed. Limited examinations for reoccurring indications may be performed as noted.  ABI Findings: +-----+------------------+-----+--------+--------+ RightRt Pressure (mmHg)IndexWaveformComment  +-----+------------------+-----+--------+--------+ ATA  202               1.92         [place]       +-----+------------------+-----+--------+--------+ PTA  160               1.52         [place]       +-----+------------------+-----+--------+--------+ +--------+------------------+-----+--------+-------+ Left    Lt Pressure (mmHg)IndexWaveformComment +--------+------------------+-----+--------+-------+ Brachial105                                    +--------+------------------+-----+--------+-------+ ATA     154               1.47         [place]      +--------+------------------+-----+--------+-------+ +-------+-----------+------------+------------+------------+ ABI/TBIToday's ABIToday's TBI Previous ABIPrevious TBI +-------+-----------+------------+------------+------------+ Right  >1.0 [place]    Not obtained1.27        .46          +-------+-----------+------------+------------+------------+ Left   >1.0     Not obtained1.23        .18          +-------+-----------+------------+------------+------------+  Summary: Right: Resting right ankle-brachial index indicates noncompressible right lower extremity arteries. TBIs not obtained, Exam was limited due to Patient's inability to Lie down or move leg to a assess Arterial flow. Imaged the Right PTA,ATA and Peroneal Artery Popliteal Artery was not obtained due to wrapping of the Knee area with gauze. Left: Resting left ankle-brachial index indicates noncompressible left lower extremity arteries. TBIs not obtained, Exam was limited due to Patient's inability to Lie  down or move leg to a assess Arterial flow. Imaged the Popliteal Artery, Posterior Tibial Artery, Anterior Tibial Artery and Peroneal Artery.  *See table(s) above for measurements and observations.  Electronically signed by Hortencia Pilar MD on 05/04/2021 at 4:47:06 PM.    Final      Discharge Exam: Vitals:   05/28/21 0618 05/28/21 0754  BP: 95/77   Pulse: 89 76  Resp: 14 15  Temp:  (!) 97.4 F (36.3 C)  SpO2: 100% 99%   Vitals:   05/28/21 0400 05/28/21 0600 05/28/21 0618 05/28/21 0754  BP: (!) 99/59 (!) 89/51 95/77   Pulse: 82 77 89 76  Resp: '15 16 14 15  '$ Temp:    (!) 97.4 F (36.3 C)  TempSrc:    Oral  SpO2: 100% 100% 100% 99%  Weight:   92.2 kg   Height:        General: Pt is alert, awake, not in acute distress, obese, vascular catheter to right IJ Cardiovascular: RRR, S1/S2 +, no rubs, no gallops Respiratory: CTA bilaterally, no wheezing, no rhonchi Abdominal: Soft, NT, ND, bowel sounds + Extremities: no edema, no cyanosis, right lower extremity wound with dressings clean dry and intact.  No drainage noted.    The results of significant diagnostics from this hospitalization (including imaging, microbiology, ancillary and laboratory) are listed below for reference.     Microbiology: Recent Results (from the past 240 hour(s))  Resp Panel by RT-PCR (Flu A&B, Covid) Nasopharyngeal Swab     Status: None  Collection Time: 05/26/21 12:20 PM   Specimen: Nasopharyngeal Swab; Nasopharyngeal(NP) swabs in vial transport medium  Result Value Ref Range Status   SARS Coronavirus 2 by RT PCR NEGATIVE NEGATIVE Final    Comment: (NOTE) SARS-CoV-2 target nucleic acids are NOT DETECTED.  The SARS-CoV-2 RNA is generally detectable in upper respiratory specimens during the acute phase of infection. The lowest concentration of SARS-CoV-2 viral copies this assay can detect is 138 copies/mL. A negative result does not preclude SARS-Cov-2 infection and should not be used as the sole  basis for treatment or other patient management decisions. A negative result may occur with  improper specimen collection/handling, submission of specimen other than nasopharyngeal swab, presence of viral mutation(s) within the areas targeted by this assay, and inadequate number of viral copies(<138 copies/mL). A negative result must be combined with clinical observations, patient history, and epidemiological information. The expected result is Negative.  Fact Sheet for Patients:  EntrepreneurPulse.com.au  Fact Sheet for Healthcare Providers:  IncredibleEmployment.be  This test is no t yet approved or cleared by the [place] FDA and  has been authorized for detection and/or diagnosis of SARS-CoV-2 by FDA under an Emergency Use Authorization (EUA). This EUA will remain  in effect (meaning this test can be used) for the duration of the COVID-19 declaration under Section 564(b)(1) of the Act, 21 U.S.C.section 360bbb-3(b)(1), unless the authorization is terminated  or revoked sooner.       Influenza A by PCR NEGATIVE NEGATIVE Final   Influenza B by PCR NEGATIVE NEGATIVE Final    Comment: (NOTE) The Xpert Xpress SARS-CoV-2/FLU/RSV plus assay is intended as an aid in the diagnosis of influenza from Nasopharyngeal swab specimens and should not be used as a sole basis for treatment. Nasal washings and aspirates are unacceptable for Xpert Xpress SARS-CoV-2/FLU/RSV testing.  Fact Sheet for Patients: EntrepreneurPulse.com.au  Fact Sheet for Healthcare Providers: IncredibleEmployment.be  This test is not yet approved or cleared by the [place] FDA and has been authorized for detection and/or diagnosis of SARS-CoV-2 by FDA under an Emergency Use Authorization (EUA). This EUA will remain in effect (meaning this test can be used) for the duration of the COVID-19 declaration under Section 564(b)(1) of the Act,  21 U.S.C. section 360bbb-3(b)(1), unless the authorization is terminated or revoked.  Performed at [facility], [address] [place], [place] [address]   Culture, blood (Routine X 2) w Reflex to ID Panel     Status: None (Preliminary result)   Collection Time: 05/26/21 12:49 PM   Specimen: Left Antecubital; Blood  Result Value Ref Range Status   Specimen Description   Final    LEFT ANTECUBITAL BOTTLES DRAWN AEROBIC AND ANAEROBIC   Special Requests Blood Culture adequate volume  Final   Culture   Final    NO GROWTH < 24 HOURS Performed at [facility], [address], [place], [place] [address]    Report Status PENDING  Incomplete  Culture, blood (Routine X 2) w Reflex to ID Panel     Status: None (Preliminary result)   Collection Time: 05/26/21 12:50 PM   Specimen: BLOOD LEFT WRIST  Result Value Ref Range Status   Specimen Description BLOOD LEFT WRIST BOTTLES DRAWN AEROBIC ONLY  Final   Special Requests Blood Culture adequate volume  Final   Culture   Final    NO GROWTH < 24 HOURS Performed at [facility], [address] [place], [place] [address]    Report Status PENDING  Incomplete  MRSA  Next Gen by PCR, Nasal     Status: None   Collection Time: 05/26/21  6:21 PM   Specimen: Nasal Mucosa; Nasal Swab  Result Value Ref Range Status   MRSA by PCR Next Gen NOT DETECTED NOT DETECTED Final    Comment: (NOTE) The GeneXpert MRSA Assay (FDA approved for NASAL specimens only), is one component of a comprehensive MRSA colonization surveillance program. It is not intended to diagnose MRSA infection nor to guide or monitor treatment for MRSA infections. Test performance is not FDA approved in patients less than 58 years old. Performed at [facility], [address] [place], [place] [address]      Labs: BNP (last 3 results) No results for input(s): BNP in the last 8760 hours. Basic Metabolic Panel: Recent Labs  Lab 05/26/21 1123 05/27/21 0456 05/28/21 0345  NA 137 138 137   K 3.2* 3.4* 3.1*  CL 95* 97* 97*  CO2 21* 20* 25  GLUCOSE 86 70 81  BUN '10 14 10  '$ CREATININE 2.88* 3.43* 2.42*  CALCIUM 9.0 9.0 8.5*  MG  --   --  1.7   Liver Function Tests: Recent Labs  Lab 05/26/21 1123  AST 21  ALT 15  ALKPHOS 259*  BILITOT 1.3*  PROT 5.0*  ALBUMIN 1.8*   No results for input(s): LIPASE, AMYLASE in the last 168 hours. No results for input(s): AMMONIA in the last 168 hours. CBC: Recent Labs  Lab 05/26/21 1123 05/27/21 0456 05/28/21 0345  WBC 21.0* 18.5* 17.0*  NEUTROABS 17.7*  --   --   HGB 9.7* 8.6* 8.1*  HCT 31.2* 28.6* 27.2*  MCV 86.2 88.0 87.2  PLT 394 334 272   Cardiac Enzymes: No results for input(s): CKTOTAL, CKMB, CKMBINDEX, TROPONINI in the last 168 hours. BNP: Invalid input(s): POCBNP CBG: No results for input(s): GLUCAP in the last 168 hours. D-Dimer No results for input(s): DDIMER in the last 72 hours. Hgb A1c No results for input(s): HGBA1C in the last 72 hours. Lipid Profile No results for input(s): CHOL, HDL, LDLCALC, TRIG, CHOLHDL, LDLDIRECT in the last 72 hours. Thyroid function studies Recent Labs    05/27/21 0456  TSH 1.609   Anemia work up No results for input(s): VITAMINB12, FOLATE, FERRITIN, TIBC, IRON, RETICCTPCT in the last 72 hours. Urinalysis No results found for: COLORURINE, APPEARANCEUR, [place], [place], GLUCOSEU, [place], [place], [place], PROTEINUR, UROBILINOGEN, NITRITE, LEUKOCYTESUR Sepsis Labs Invalid input(s): PROCALCITONIN,  WBC,  LACTICIDVEN Microbiology Recent Results (from the past 240 hour(s))  Resp Panel by RT-PCR (Flu A&B, Covid) Nasopharyngeal Swab     Status: None   Collection Time: 05/26/21 12:20 PM   Specimen: Nasopharyngeal Swab; Nasopharyngeal(NP) swabs in vial transport medium  Result Value Ref Range Status   SARS Coronavirus 2 by RT PCR NEGATIVE NEGATIVE Final    Comment: (NOTE) SARS-CoV-2 target nucleic acids are NOT DETECTED.  The SARS-CoV-2 RNA is generally detectable in  upper respiratory specimens during the acute phase of infection. The lowest concentration of SARS-CoV-2 viral copies this assay can detect is 138 copies/mL. A negative result does not preclude SARS-Cov-2 infection and should not be used as the sole basis for treatment or other patient management decisions. A negative result may occur with  improper specimen collection/handling, submission of specimen other than nasopharyngeal swab, presence of viral mutation(s) within the areas targeted by this assay, and inadequate number of viral copies(<138 copies/mL). A negative result must be combined with clinical observations, patient history, and epidemiological information. The expected result  is Negative.  Fact Sheet for Patients:  EntrepreneurPulse.com.au  Fact Sheet for Healthcare Providers:  IncredibleEmployment.be  This test is no t yet approved or cleared by the [place] FDA and  has been authorized for detection and/or diagnosis of SARS-CoV-2 by FDA under an Emergency Use Authorization (EUA). This EUA will remain  in effect (meaning this test can be used) for the duration of the COVID-19 declaration under Section 564(b)(1) of the Act, 21 U.S.C.section 360bbb-3(b)(1), unless the authorization is terminated  or revoked sooner.       Influenza A by PCR NEGATIVE NEGATIVE Final   Influenza B by PCR NEGATIVE NEGATIVE Final    Comment: (NOTE) The Xpert Xpress SARS-CoV-2/FLU/RSV plus assay is intended as an aid in the diagnosis of influenza from Nasopharyngeal swab specimens and should not be used as a sole basis for treatment. Nasal washings and aspirates are unacceptable for Xpert Xpress SARS-CoV-2/FLU/RSV testing.  Fact Sheet for Patients: EntrepreneurPulse.com.au  Fact Sheet for Healthcare Providers: IncredibleEmployment.be  This test is not yet approved or cleared by the [place] FDA and has been  authorized for detection and/or diagnosis of SARS-CoV-2 by FDA under an Emergency Use Authorization (EUA). This EUA will remain in effect (meaning this test can be used) for the duration of the COVID-19 declaration under Section 564(b)(1) of the Act, 21 U.S.C. section 360bbb-3(b)(1), unless the authorization is terminated or revoked.  Performed at [facility], [address] [place], [place] [address]   Culture, blood (Routine X 2) w Reflex to ID Panel     Status: None (Preliminary result)   Collection Time: 05/26/21 12:49 PM   Specimen: Left Antecubital; Blood  Result Value Ref Range Status   Specimen Description   Final    LEFT ANTECUBITAL BOTTLES DRAWN AEROBIC AND ANAEROBIC   Special Requests Blood Culture adequate volume  Final   Culture   Final    NO GROWTH < 24 HOURS Performed at [facility], [address] [place], [place] [address]    Report Status PENDING  Incomplete  Culture, blood (Routine X 2) w Reflex to ID Panel     Status: None (Preliminary result)   Collection Time: 05/26/21 12:50 PM   Specimen: BLOOD LEFT WRIST  Result Value Ref Range Status   Specimen Description BLOOD LEFT WRIST BOTTLES DRAWN AEROBIC ONLY  Final   Special Requests Blood Culture adequate volume  Final   Culture   Final    NO GROWTH < 24 HOURS Performed at [facility], [address], [place], [place] [address]    Report Status PENDING  Incomplete  MRSA Next Gen by PCR, Nasal     Status: None   Collection Time: 05/26/21  6:21 PM   Specimen: Nasal Mucosa; Nasal Swab  Result Value Ref Range Status   MRSA by PCR Next Gen NOT DETECTED NOT DETECTED Final    Comment: (NOTE) The GeneXpert MRSA Assay (FDA approved for NASAL specimens only), is one component of a comprehensive MRSA colonization surveillance program. It is not intended to diagnose MRSA infection nor to guide or monitor treatment for MRSA infections. Test performance is not FDA approved in patients less than 68 years old. Performed  at [facility], [address], [place], [place] [address]      Time coordinating discharge: 35 minutes  SIGNED:   Rodena Goldmann, DO [facility] 05/28/2021, 9:10 AM  If 7PM-7AM, please contact night-coverage [email or web address]

## 2021-05-28 NOTE — Progress Notes (Signed)
Pharmacy Antibiotic Note  Lori [name] is a 50 y.o. female admitted on 05/26/2021 with  unknown source of infection .  Pharmacy has been consulted for Vancomycin and Cefepime dosing.  Plan: Vancomycin 1500 mg IV x 1 dose. Vanco 1000 mg IV every dialysis session QTTHSa. Cefepime 1000 mg IV every 24 hours. Monitor labs, c/s, and vanco level as indicated.  Height: '5\' 3"'$  (160 cm) Weight: 92.2 kg (203 lb 4.2 oz) IBW/kg (Calculated) : 52.4  Temp (24hrs), Avg:97.4 F (36.3 C), Min:96.9 F (36.1 C), Max:97.8 F (36.6 C)  Recent Labs  Lab 05/26/21 1123 05/26/21 1250 05/26/21 1649 05/27/21 0456 05/28/21 0345  WBC 21.0*  --   --  18.5* 17.0*  CREATININE 2.88*  --   --  3.43* 2.42*  LATICACIDVEN  --  1.5 1.8  --   --      Estimated Creatinine Clearance: 29.7 mL/min (A) (by C-G formula based on SCr of 2.42 mg/dL (H)).    Allergies  Allergen Reactions   Alteplase Shortness Of Breath and Anaphylaxis   Bee Pollen Anaphylaxis   Bee Venom Anaphylaxis   Warfarin Anaphylaxis   Warfarin Sodium Nausea And Vomiting and Rash    Antimicrobials this admission: Cefepime 7/1 >>  Vanco 7/1 >>  Flagyl 7/1   Microbiology results: 7/1 BCx: NGTD  7/1 Resp Panel: negative    Thank you for allowing pharmacy to be a part of this patient's care.  Donna Christen Troy Kanouse 05/28/2021 10:13 AM

## 2021-05-28 NOTE — TOC Transition Note (Signed)
Transition of Care [facility]) - CM/SW Discharge Note   Patient Details  Name: JUNEAU CONTOIS MRN: [id number] Date of Birth: [birth year]  Transition of Care [facility] CM/SW Contact:  Boneta Lucks, RN Phone Number: 05/28/2021, 11:55 AM   Clinical Narrative:   Patient medically ready to return to [facility], confirmed with facility they are ready, patient can come back under LTC, no auth needed. RN to call Flo at [facility] to give report and call EMS.   Final next level of care: Skilled Nursing Facility Barriers to Discharge: Barriers Resolved   Patient Goals and CMS Choice Patient states their goals for this hospitalization and ongoing recovery are:: to return to SNF. CMS Medicare.gov Compare Post Acute Care list provided to:: Patient    Discharge Placement                Patient to be transferred to facility by: EMS   Patient and family notified of of transfer: 05/28/21  Discharge Plan and Services                                     Social Determinants of Health (SDOH) Interventions     Readmission Risk Interventions Readmission Risk Prevention Plan 05/28/2021 01/03/2021 12/14/2020  Medication Screening - - Complete  Transportation Screening Complete Complete Complete  PCP or Specialist Appt within 3-5 Days Complete Complete -  HRI or Home Care Consult Complete Complete -  Social Work Consult for [place] Planning/Counseling Complete Complete -  Palliative Care Screening Not Applicable Not Applicable -  Medication Review Press photographer) Complete Complete -  Some recent data might be hidden

## 2021-05-28 NOTE — Progress Notes (Signed)
Notified MD of edema to right arm and hand. Patient denies pain, numbness, tingling. Digits and hand warm to touch and appropriate to race in color.

## 2021-05-29 LAB — CREATININE, SERUM
Creatinine, Ser: 3.15 mg/dL — ABNORMAL HIGH (ref 0.44–1.00)
GFR, Estimated: 17 mL/min — ABNORMAL LOW (ref >60)

## 2021-05-29 NOTE — Progress Notes (Signed)
EMS present to pickup patient. IV removed, WNL. Pt given pain meds before D/C for pain rated a 10/10. Report and AVS printed from yesterday given to EMS.

## 2021-05-29 NOTE — Progress Notes (Signed)
Patient unable to transfer to SNF due to being after 2200. After 2200, per receiving nurse, the facility does not take admissions.

## 2021-05-29 NOTE — Progress Notes (Signed)
Called [facility] to verify that patient is still on the list to be transferred today. Verified that they are, but that they do not have many trucks available just for patient discharges so it may be awhile. Will make MD aware.

## 2021-05-29 NOTE — Progress Notes (Signed)
Patient seen and evaluated this AM.  Noted to have some right arm swelling, but this is not unusual for her.  No other acute events noted overnight.  Please refer to discharge summary dictated 7/3 for full details.  She is stable for discharge.  Total care time: 15 minutes

## 2021-05-29 NOTE — TOC Progression Note (Signed)
Patient on EMS List. EMS may not be able to transport patient. TOC to follow.

## 2021-05-30 DIAGNOSIS — N186 End stage renal disease: Secondary | ICD-10-CM | POA: Diagnosis not present

## 2021-05-30 DIAGNOSIS — Z992 Dependence on renal dialysis: Secondary | ICD-10-CM | POA: Diagnosis not present

## 2021-05-31 LAB — CULTURE, BLOOD (ROUTINE X 2)
Culture: NO GROWTH
Culture: NO GROWTH
Special Requests: ADEQUATE
Special Requests: ADEQUATE

## 2021-06-01 DIAGNOSIS — L0390 Cellulitis, unspecified: Secondary | ICD-10-CM | POA: Diagnosis not present

## 2021-06-01 DIAGNOSIS — Z992 Dependence on renal dialysis: Secondary | ICD-10-CM | POA: Diagnosis not present

## 2021-06-01 DIAGNOSIS — D638 Anemia in other chronic diseases classified elsewhere: Secondary | ICD-10-CM | POA: Diagnosis not present

## 2021-06-01 DIAGNOSIS — N186 End stage renal disease: Secondary | ICD-10-CM | POA: Diagnosis not present

## 2021-06-01 DIAGNOSIS — E039 Hypothyroidism, unspecified: Secondary | ICD-10-CM | POA: Diagnosis not present

## 2021-06-01 DIAGNOSIS — E785 Hyperlipidemia, unspecified: Secondary | ICD-10-CM | POA: Diagnosis not present

## 2021-06-01 DIAGNOSIS — I4891 Unspecified atrial fibrillation: Secondary | ICD-10-CM | POA: Diagnosis not present

## 2021-06-03 DIAGNOSIS — N186 End stage renal disease: Secondary | ICD-10-CM | POA: Diagnosis not present

## 2021-06-03 DIAGNOSIS — Z992 Dependence on renal dialysis: Secondary | ICD-10-CM | POA: Diagnosis not present

## 2021-06-05 DIAGNOSIS — I10 Essential (primary) hypertension: Secondary | ICD-10-CM | POA: Diagnosis not present

## 2021-06-06 DIAGNOSIS — Z992 Dependence on renal dialysis: Secondary | ICD-10-CM | POA: Diagnosis not present

## 2021-06-06 DIAGNOSIS — N186 End stage renal disease: Secondary | ICD-10-CM | POA: Diagnosis not present

## 2021-06-08 DIAGNOSIS — N186 End stage renal disease: Secondary | ICD-10-CM | POA: Diagnosis not present

## 2021-06-08 DIAGNOSIS — Z992 Dependence on renal dialysis: Secondary | ICD-10-CM | POA: Diagnosis not present

## 2021-06-09 DIAGNOSIS — R0902 Hypoxemia: Secondary | ICD-10-CM | POA: Diagnosis not present

## 2021-06-09 DIAGNOSIS — Z8673 Personal history of transient ischemic attack (TIA), and cerebral infarction without residual deficits: Secondary | ICD-10-CM | POA: Diagnosis not present

## 2021-06-09 DIAGNOSIS — J3801 Paralysis of vocal cords and larynx, unilateral: Secondary | ICD-10-CM | POA: Diagnosis not present

## 2021-06-09 DIAGNOSIS — R000 Tachycardia, unspecified: Secondary | ICD-10-CM | POA: Diagnosis not present

## 2021-06-09 DIAGNOSIS — E785 Hyperlipidemia, unspecified: Secondary | ICD-10-CM | POA: Diagnosis not present

## 2021-06-09 DIAGNOSIS — Z992 Dependence on renal dialysis: Secondary | ICD-10-CM | POA: Diagnosis not present

## 2021-06-09 DIAGNOSIS — Z8616 Personal history of COVID-19: Secondary | ICD-10-CM | POA: Diagnosis not present

## 2021-06-09 DIAGNOSIS — Z955 Presence of coronary angioplasty implant and graft: Secondary | ICD-10-CM | POA: Diagnosis not present

## 2021-06-09 DIAGNOSIS — I469 Cardiac arrest, cause unspecified: Secondary | ICD-10-CM | POA: Diagnosis not present

## 2021-06-09 DIAGNOSIS — Z85850 Personal history of malignant neoplasm of thyroid: Secondary | ICD-10-CM | POA: Diagnosis not present

## 2021-06-09 DIAGNOSIS — L97919 Non-pressure chronic ulcer of unspecified part of right lower leg with unspecified severity: Secondary | ICD-10-CM | POA: Diagnosis not present

## 2021-06-09 DIAGNOSIS — L97119 Non-pressure chronic ulcer of right thigh with unspecified severity: Secondary | ICD-10-CM | POA: Diagnosis not present

## 2021-06-09 DIAGNOSIS — N186 End stage renal disease: Secondary | ICD-10-CM | POA: Diagnosis not present

## 2021-06-09 DIAGNOSIS — I517 Cardiomegaly: Secondary | ICD-10-CM | POA: Diagnosis not present

## 2021-06-09 DIAGNOSIS — R609 Edema, unspecified: Secondary | ICD-10-CM | POA: Diagnosis not present

## 2021-06-09 DIAGNOSIS — E213 Hyperparathyroidism, unspecified: Secondary | ICD-10-CM | POA: Diagnosis not present

## 2021-06-09 DIAGNOSIS — R531 Weakness: Secondary | ICD-10-CM | POA: Diagnosis not present

## 2021-06-09 DIAGNOSIS — I4891 Unspecified atrial fibrillation: Secondary | ICD-10-CM | POA: Diagnosis not present

## 2021-06-09 DIAGNOSIS — Z7982 Long term (current) use of aspirin: Secondary | ICD-10-CM | POA: Diagnosis not present

## 2021-06-09 DIAGNOSIS — R0602 Shortness of breath: Secondary | ICD-10-CM | POA: Diagnosis not present

## 2021-06-09 DIAGNOSIS — Z20822 Contact with and (suspected) exposure to covid-19: Secondary | ICD-10-CM | POA: Diagnosis not present

## 2021-06-09 DIAGNOSIS — I499 Cardiac arrhythmia, unspecified: Secondary | ICD-10-CM | POA: Diagnosis not present

## 2021-06-09 DIAGNOSIS — E890 Postprocedural hypothyroidism: Secondary | ICD-10-CM | POA: Diagnosis not present

## 2021-06-09 DIAGNOSIS — Z89422 Acquired absence of other left toe(s): Secondary | ICD-10-CM | POA: Diagnosis not present

## 2021-06-09 DIAGNOSIS — Z79899 Other long term (current) drug therapy: Secondary | ICD-10-CM | POA: Diagnosis not present

## 2021-06-09 DIAGNOSIS — Z743 Need for continuous supervision: Secondary | ICD-10-CM | POA: Diagnosis not present

## 2021-06-09 DIAGNOSIS — I959 Hypotension, unspecified: Secondary | ICD-10-CM | POA: Diagnosis not present

## 2021-06-09 DIAGNOSIS — A419 Sepsis, unspecified organism: Secondary | ICD-10-CM | POA: Diagnosis not present

## 2021-06-12 ENCOUNTER — Ambulatory Visit: Payer: Medicare Other | Admitting: Internal Medicine

## 2021-06-26 DEATH — deceased

## 2021-10-20 IMAGING — DX DG HIP (WITH OR WITHOUT PELVIS) 3-4V BILAT
6 series · 6 of 6 positions shown · non-contrast
Comparison: None.

CLINICAL DATA: Fall, initial encounter.

EXAM:
DG HIP (WITH OR WITHOUT PELVIS) 3-4V BILAT

[pelvis ap]
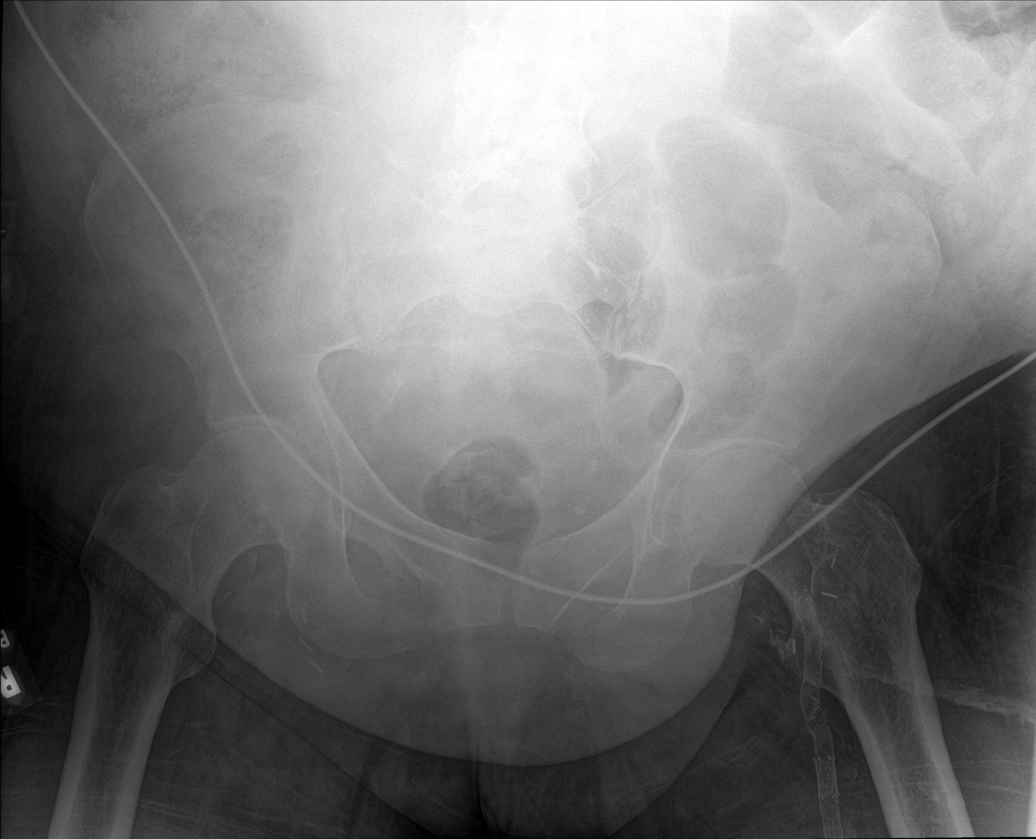

[hip ap (1 of 2)]
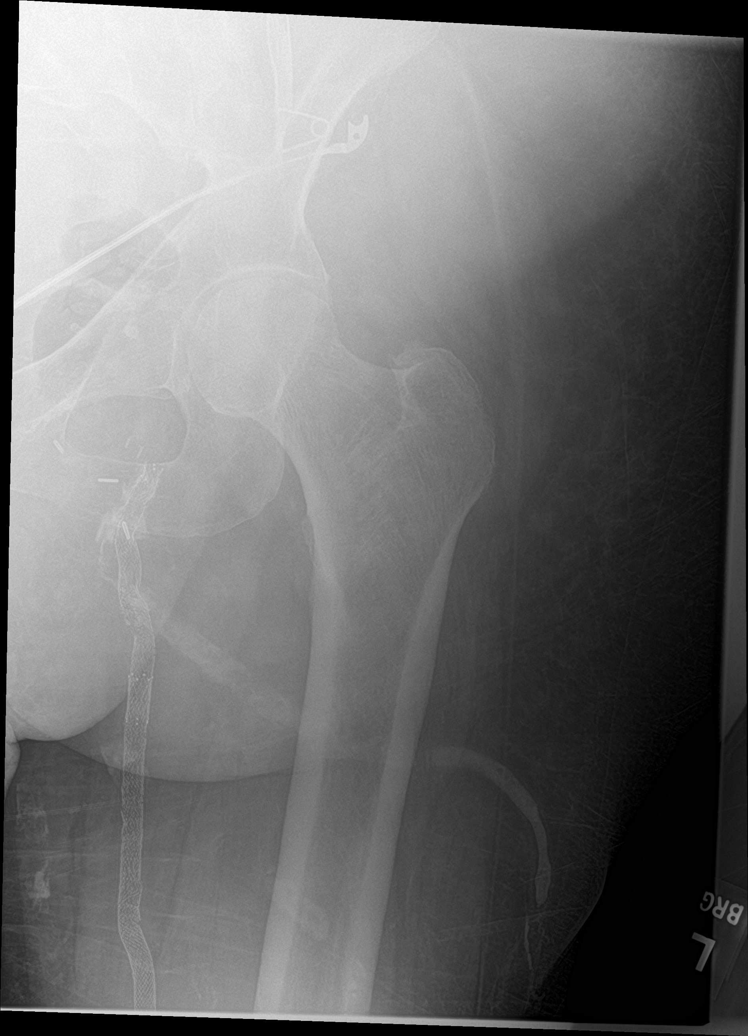

[hip lat (1 of 3)]
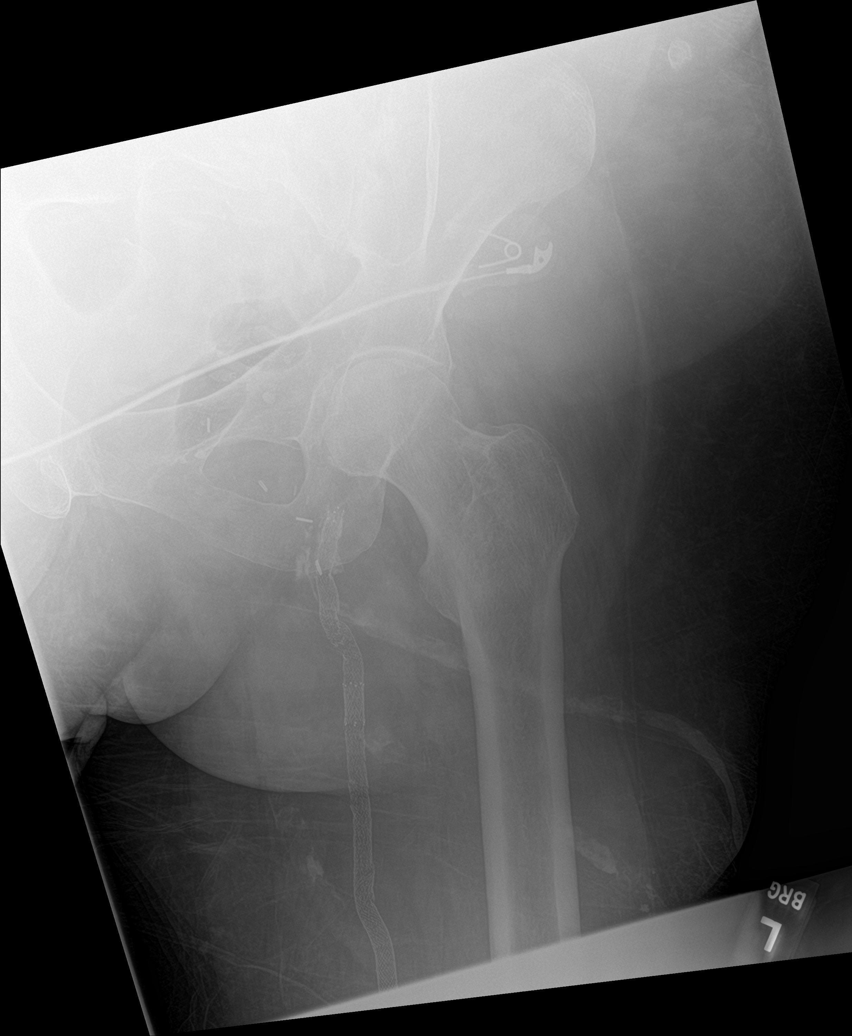

[hip ap (2 of 2)]
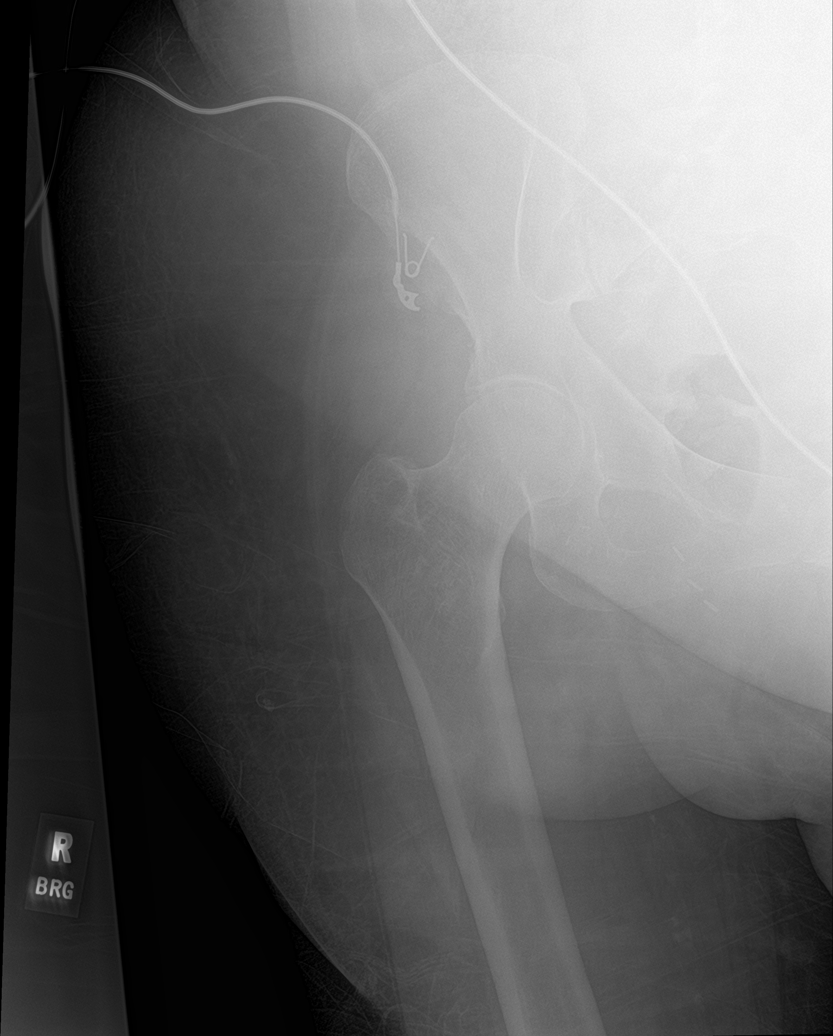

[hip lat (2 of 3)]
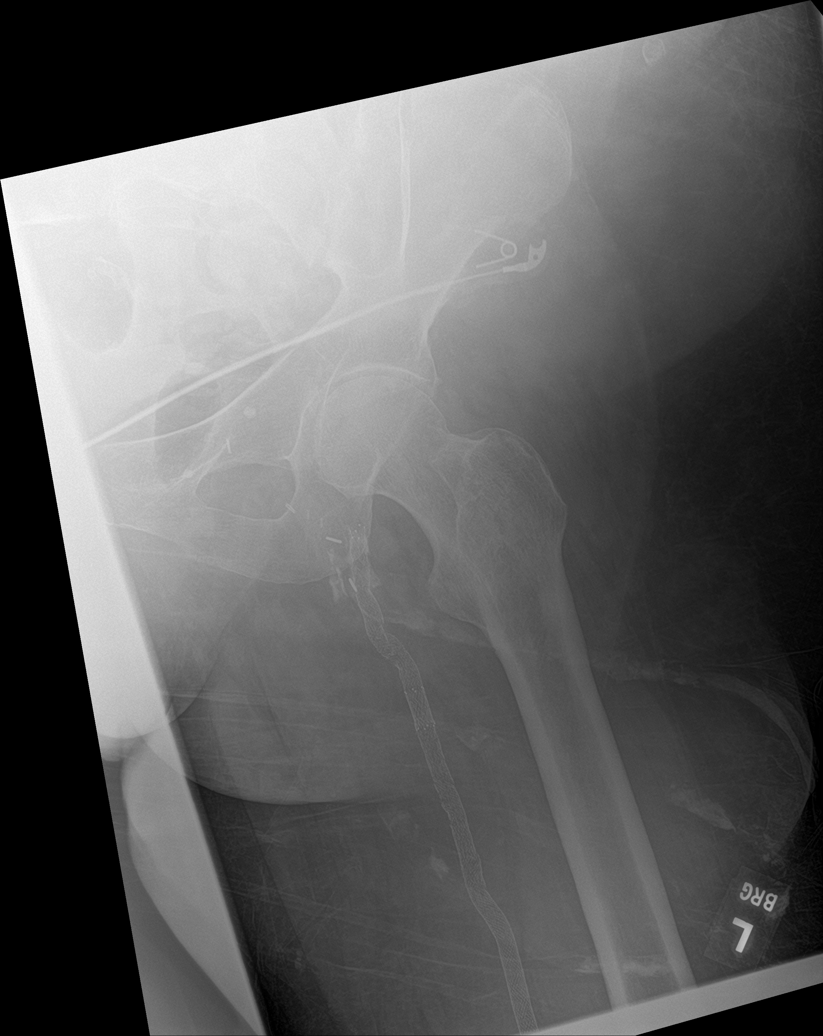

[hip lat (3 of 3)]
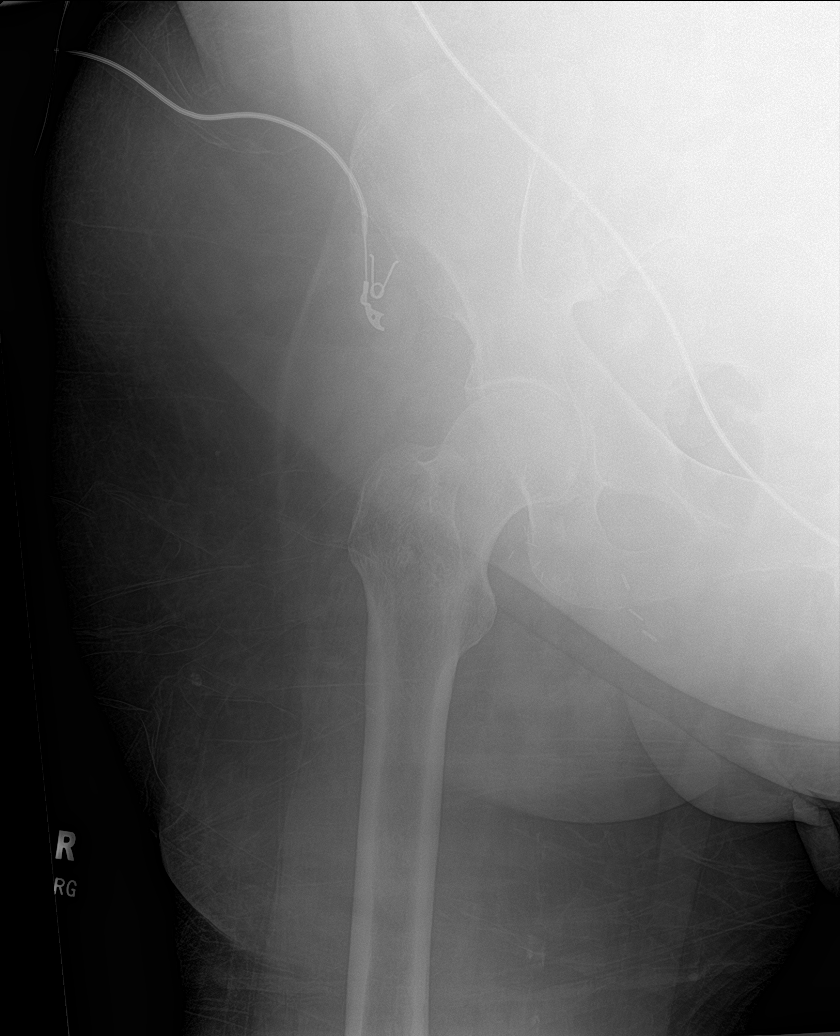

[6 of 6 positions shown; findings below may reference images not displayed]

FINDINGS: Image quality is degraded by body habitus. No acute osseous or joint
abnormality. Vascular stent is seen in the left femoral vasculature.
IMPRESSION: Image quality is degraded by body habitus. No definite acute osseous
or joint abnormality.

## 2021-10-20 IMAGING — DX DG FOOT COMPLETE 3+V*L*
3 series · 3 of 3 positions shown · non-contrast
Comparison: None.

CLINICAL DATA: Wound on foot near fourth and fifth MTP

EXAM:
LEFT FOOT - COMPLETE 3+ VIEW

[foot ap]
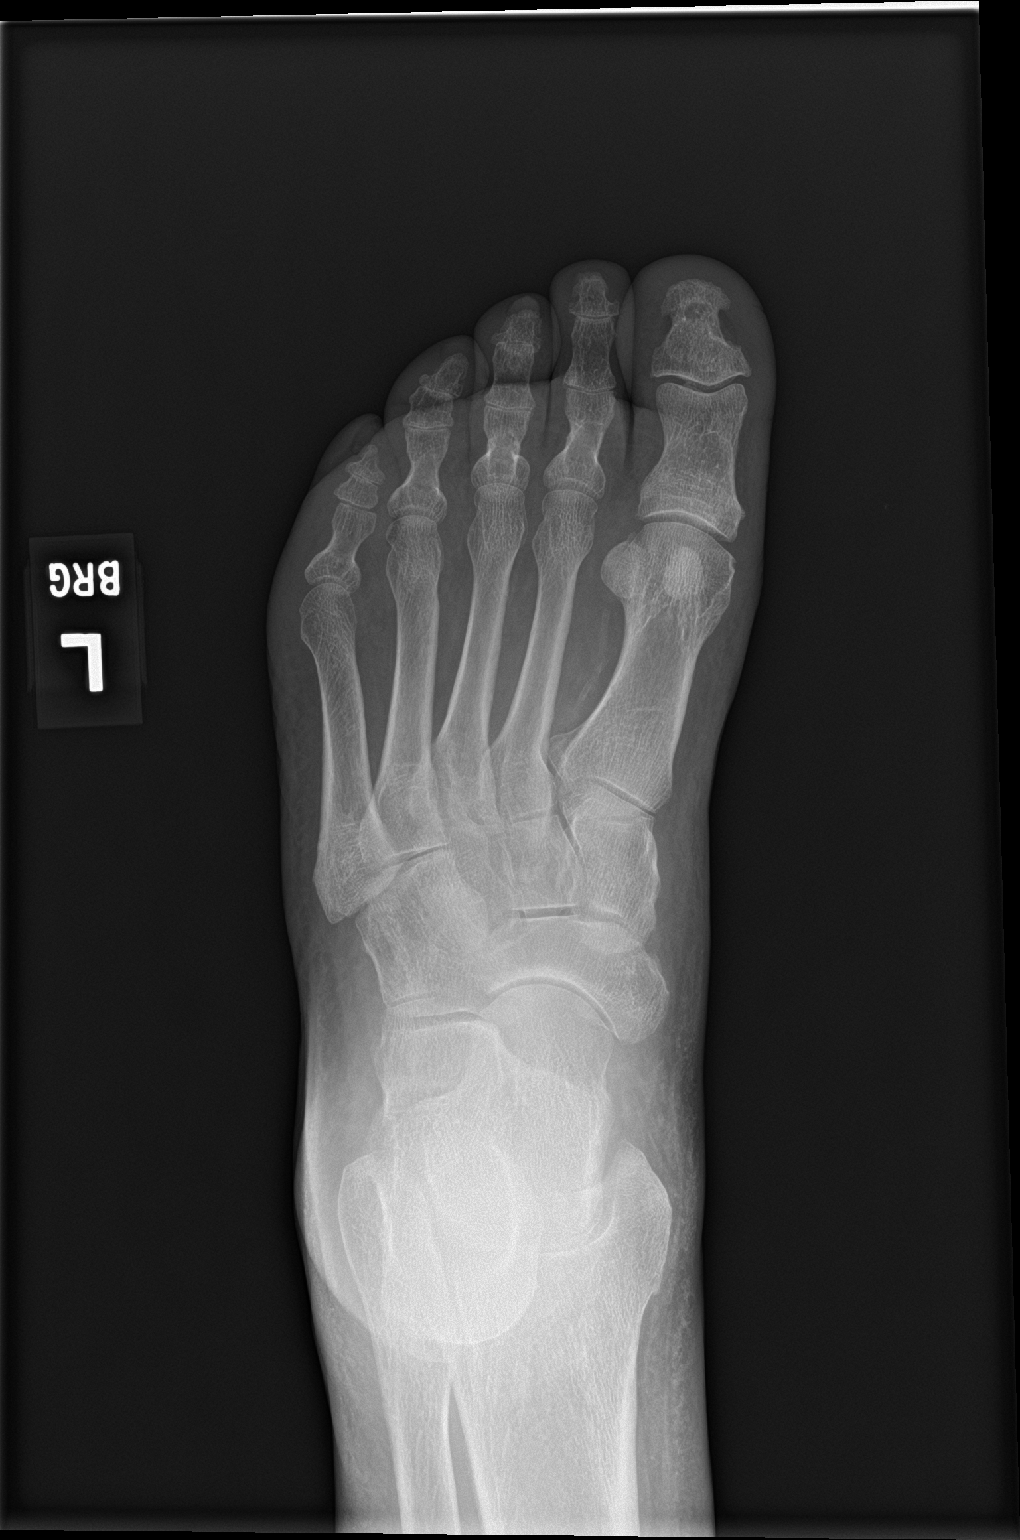

[foot obl]
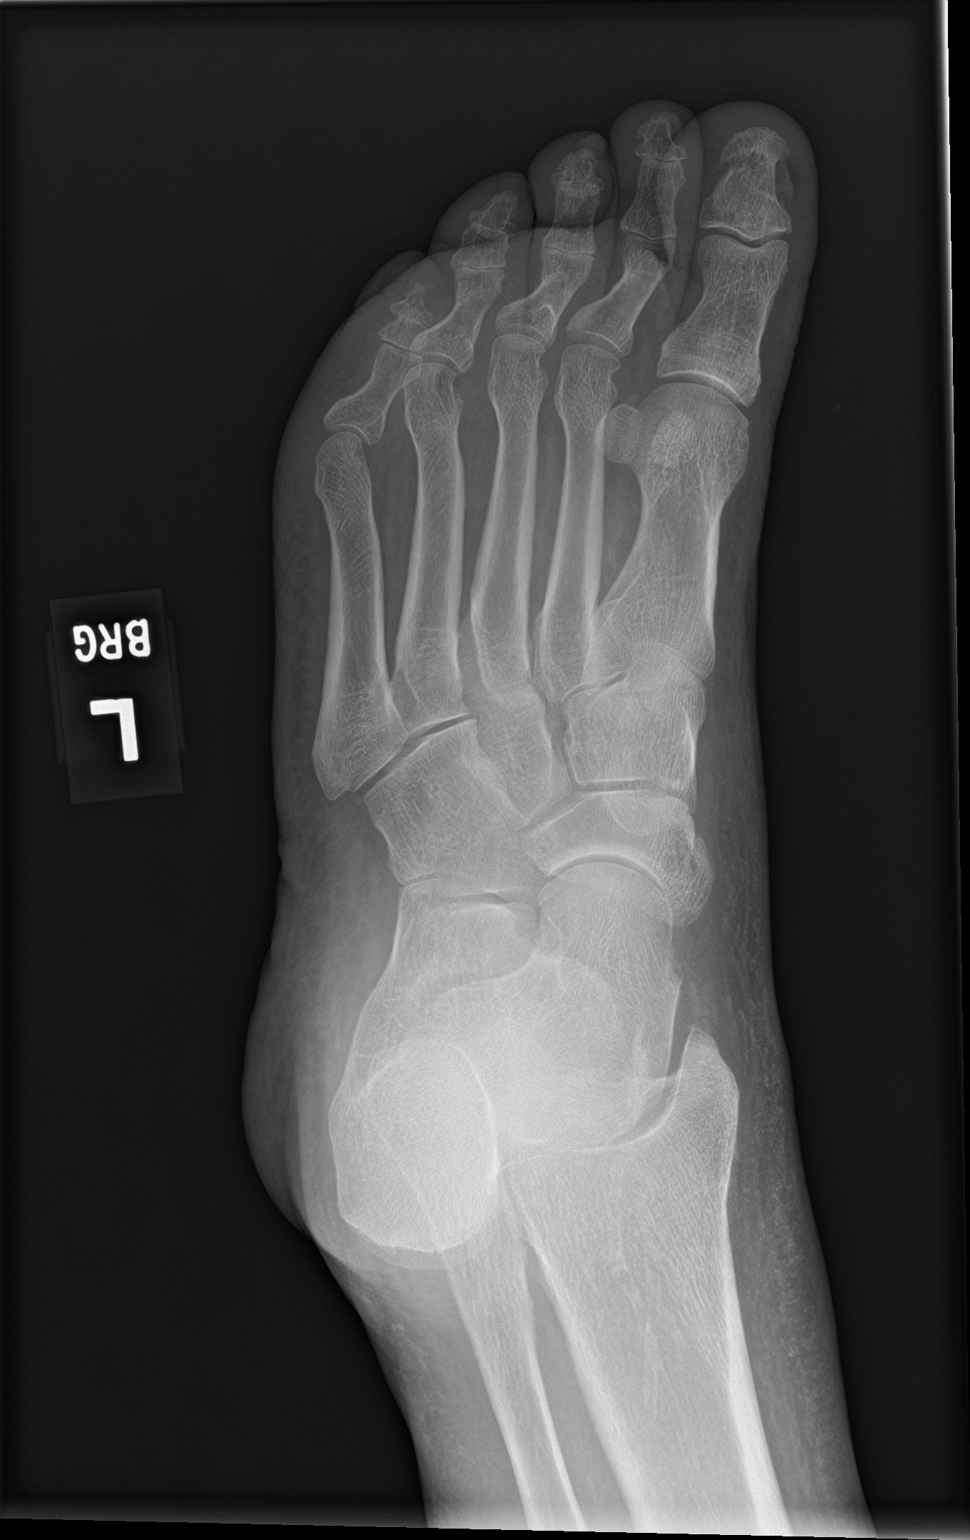

[foot lat]
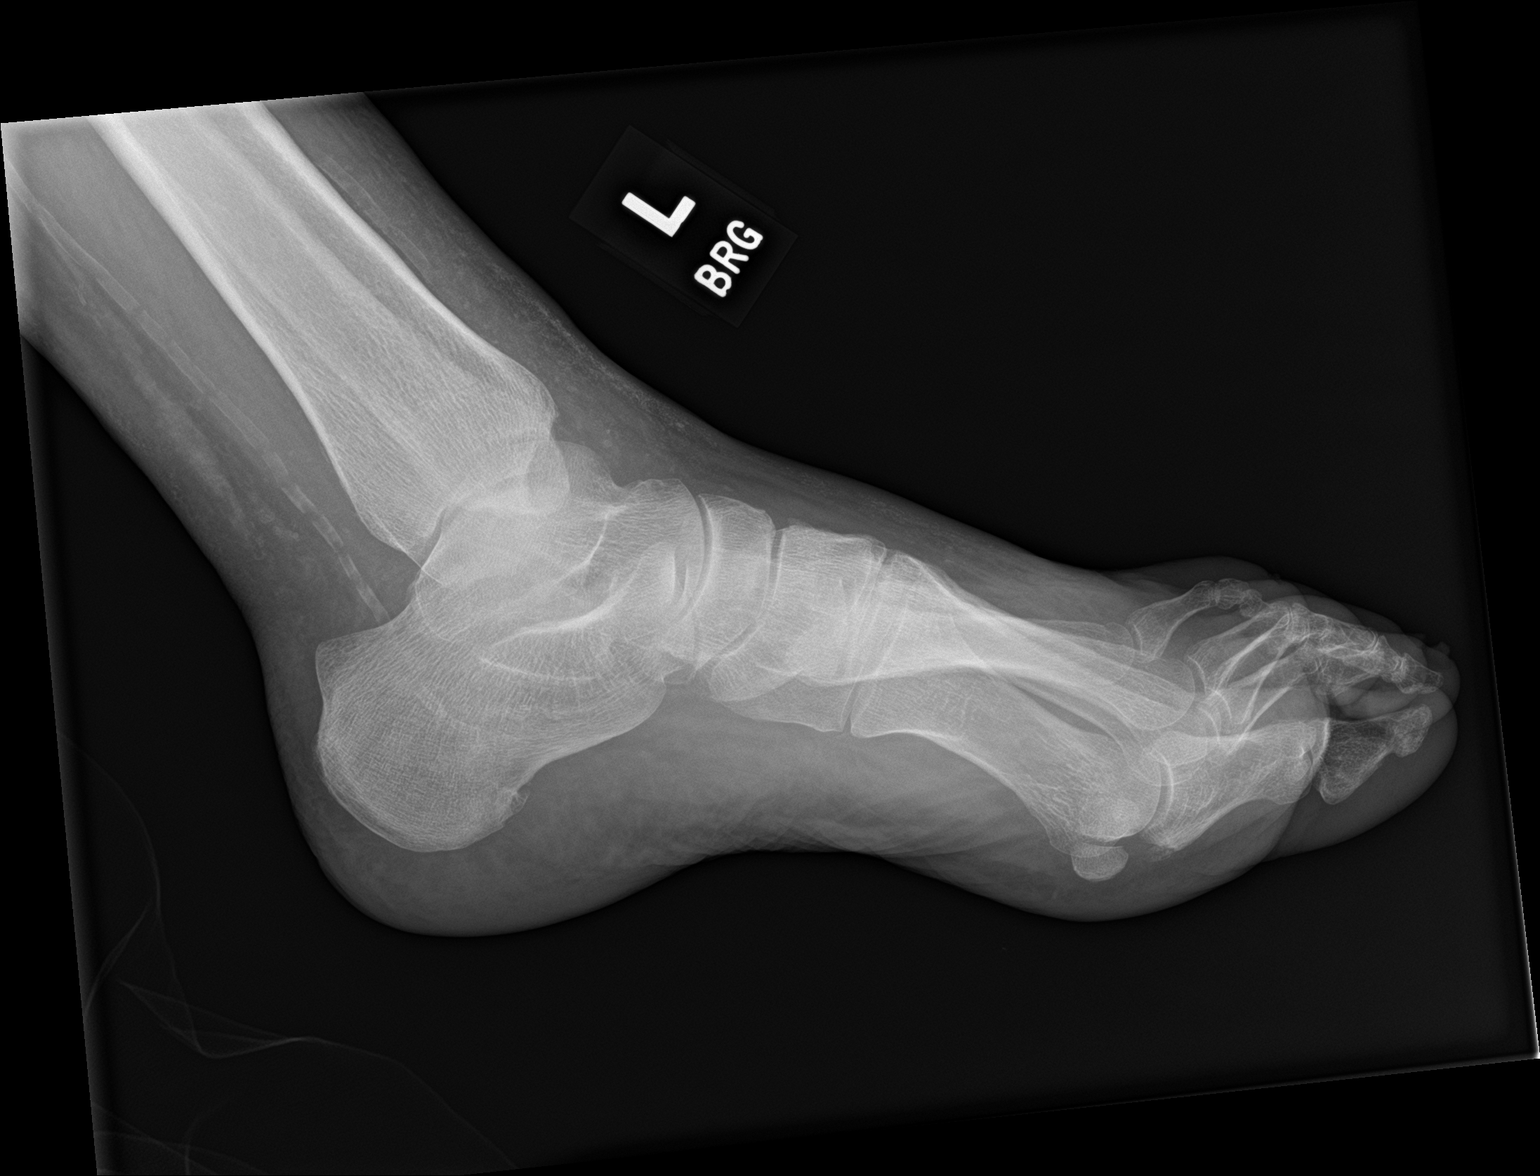

[3 of 3 positions shown; findings below may reference images not displayed]

FINDINGS: No fracture or malalignment. Vascular calcifications. No soft tissue
emphysema or radiopaque foreign body.
IMPRESSION: No acute osseous abnormality.
